# Patient Record
Sex: Female | Born: 1950
Health system: Southern US, Community
[De-identification: ages and names within clinical notes are randomized; demographics above are authoritative.]

## PROBLEM LIST (undated history)

## (undated) DIAGNOSIS — M549 Dorsalgia, unspecified: Secondary | ICD-10-CM

## (undated) DIAGNOSIS — M2550 Pain in unspecified joint: Secondary | ICD-10-CM

## (undated) DIAGNOSIS — J069 Acute upper respiratory infection, unspecified: Secondary | ICD-10-CM

## (undated) DIAGNOSIS — D369 Benign neoplasm, unspecified site: Secondary | ICD-10-CM

## (undated) DIAGNOSIS — Z8619 Personal history of other infectious and parasitic diseases: Secondary | ICD-10-CM

## (undated) DIAGNOSIS — J309 Allergic rhinitis, unspecified: Secondary | ICD-10-CM

## (undated) DIAGNOSIS — L509 Urticaria, unspecified: Secondary | ICD-10-CM

## (undated) DIAGNOSIS — F419 Anxiety disorder, unspecified: Secondary | ICD-10-CM

## (undated) DIAGNOSIS — F32A Depression, unspecified: Secondary | ICD-10-CM

## (undated) DIAGNOSIS — M797 Fibromyalgia: Secondary | ICD-10-CM

## (undated) DIAGNOSIS — G4700 Insomnia, unspecified: Secondary | ICD-10-CM

## (undated) DIAGNOSIS — I471 Supraventricular tachycardia, unspecified: Secondary | ICD-10-CM

## (undated) DIAGNOSIS — M069 Rheumatoid arthritis, unspecified: Secondary | ICD-10-CM

## (undated) DIAGNOSIS — K5900 Constipation, unspecified: Secondary | ICD-10-CM

## (undated) DIAGNOSIS — E785 Hyperlipidemia, unspecified: Secondary | ICD-10-CM

## (undated) DIAGNOSIS — Z860100 Personal history of colon polyps, unspecified: Secondary | ICD-10-CM

## (undated) DIAGNOSIS — M810 Age-related osteoporosis without current pathological fracture: Secondary | ICD-10-CM

## (undated) DIAGNOSIS — Z8489 Family history of other specified conditions: Secondary | ICD-10-CM

## (undated) DIAGNOSIS — R3915 Urgency of urination: Secondary | ICD-10-CM

## (undated) DIAGNOSIS — H269 Unspecified cataract: Secondary | ICD-10-CM

## (undated) DIAGNOSIS — J45909 Unspecified asthma, uncomplicated: Secondary | ICD-10-CM

## (undated) DIAGNOSIS — M1990 Unspecified osteoarthritis, unspecified site: Secondary | ICD-10-CM

## (undated) DIAGNOSIS — K219 Gastro-esophageal reflux disease without esophagitis: Secondary | ICD-10-CM

## (undated) DIAGNOSIS — G8929 Other chronic pain: Secondary | ICD-10-CM

## (undated) DIAGNOSIS — Z8709 Personal history of other diseases of the respiratory system: Secondary | ICD-10-CM

## (undated) DIAGNOSIS — I10 Essential (primary) hypertension: Secondary | ICD-10-CM

## (undated) DIAGNOSIS — L309 Dermatitis, unspecified: Secondary | ICD-10-CM

## (undated) DIAGNOSIS — T7840XA Allergy, unspecified, initial encounter: Secondary | ICD-10-CM

## (undated) DIAGNOSIS — F329 Major depressive disorder, single episode, unspecified: Secondary | ICD-10-CM

## (undated) DIAGNOSIS — I499 Cardiac arrhythmia, unspecified: Secondary | ICD-10-CM

## (undated) DIAGNOSIS — J189 Pneumonia, unspecified organism: Secondary | ICD-10-CM

## (undated) DIAGNOSIS — Z9289 Personal history of other medical treatment: Secondary | ICD-10-CM

## (undated) HISTORY — DX: Acute upper respiratory infection, unspecified: J06.9

## (undated) HISTORY — DX: Unspecified asthma, uncomplicated: J45.909

## (undated) HISTORY — PX: COLONOSCOPY: SHX174

## (undated) HISTORY — PX: ADENOIDECTOMY: SUR15

## (undated) HISTORY — DX: Unspecified cataract: H26.9

## (undated) HISTORY — DX: Rheumatoid arthritis, unspecified: M06.9

## (undated) HISTORY — DX: Urticaria, unspecified: L50.9

## (undated) HISTORY — DX: Allergy, unspecified, initial encounter: T78.40XA

## (undated) HISTORY — DX: Benign neoplasm, unspecified site: D36.9

## (undated) HISTORY — DX: Supraventricular tachycardia, unspecified: I47.10

## (undated) HISTORY — PX: KNEE ARTHROSCOPY: SUR90

## (undated) HISTORY — PX: TONSILLECTOMY: SUR1361

## (undated) HISTORY — PX: APPENDECTOMY: SHX54

## (undated) HISTORY — PX: OTHER SURGICAL HISTORY: SHX169

## (undated) HISTORY — DX: Depression, unspecified: F32.A

## (undated) HISTORY — DX: Essential (primary) hypertension: I10

## (undated) HISTORY — DX: Fibromyalgia: M79.7

## (undated) HISTORY — DX: Allergic rhinitis, unspecified: J30.9

## (undated) HISTORY — DX: Age-related osteoporosis without current pathological fracture: M81.0

## (undated) HISTORY — DX: Dermatitis, unspecified: L30.9

## (undated) HISTORY — DX: Major depressive disorder, single episode, unspecified: F32.9

## (undated) HISTORY — DX: Supraventricular tachycardia: I47.1

## (undated) HISTORY — PX: SHOULDER SURGERY: SHX246

## (undated) HISTORY — PX: BREAST BIOPSY: SHX20

## (undated) HISTORY — DX: Unspecified osteoarthritis, unspecified site: M19.90

---

## 1999-01-28 ENCOUNTER — Other Ambulatory Visit: Admission: RE | Admit: 1999-01-28 | Discharge: 1999-01-28 | Payer: Self-pay | Admitting: *Deleted

## 1999-12-28 ENCOUNTER — Encounter: Payer: Self-pay | Admitting: Allergy and Immunology

## 1999-12-28 ENCOUNTER — Encounter: Admission: RE | Admit: 1999-12-28 | Discharge: 1999-12-28 | Payer: Self-pay | Admitting: *Deleted

## 2000-04-21 ENCOUNTER — Other Ambulatory Visit: Admission: RE | Admit: 2000-04-21 | Discharge: 2000-04-21 | Payer: Self-pay | Admitting: Internal Medicine

## 2001-01-23 ENCOUNTER — Other Ambulatory Visit: Admission: RE | Admit: 2001-01-23 | Discharge: 2001-01-23 | Payer: Self-pay | Admitting: Internal Medicine

## 2002-03-12 ENCOUNTER — Other Ambulatory Visit: Admission: RE | Admit: 2002-03-12 | Discharge: 2002-03-12 | Payer: Self-pay | Admitting: Internal Medicine

## 2002-12-05 ENCOUNTER — Encounter: Payer: Self-pay | Admitting: Internal Medicine

## 2002-12-05 ENCOUNTER — Encounter: Admission: RE | Admit: 2002-12-05 | Discharge: 2002-12-05 | Payer: Self-pay | Admitting: Internal Medicine

## 2002-12-11 ENCOUNTER — Encounter: Admission: RE | Admit: 2002-12-11 | Discharge: 2002-12-11 | Payer: Self-pay | Admitting: Sports Medicine

## 2003-04-17 ENCOUNTER — Other Ambulatory Visit: Admission: RE | Admit: 2003-04-17 | Discharge: 2003-04-17 | Payer: Self-pay | Admitting: Internal Medicine

## 2003-10-31 ENCOUNTER — Encounter: Admission: RE | Admit: 2003-10-31 | Discharge: 2003-10-31 | Payer: Self-pay | Admitting: Internal Medicine

## 2004-01-26 ENCOUNTER — Encounter: Admission: RE | Admit: 2004-01-26 | Discharge: 2004-01-26 | Payer: Self-pay | Admitting: Internal Medicine

## 2004-04-23 ENCOUNTER — Other Ambulatory Visit: Admission: RE | Admit: 2004-04-23 | Discharge: 2004-04-23 | Payer: Self-pay | Admitting: Internal Medicine

## 2004-07-15 ENCOUNTER — Encounter: Admission: RE | Admit: 2004-07-15 | Discharge: 2004-07-15 | Payer: Self-pay | Admitting: Internal Medicine

## 2004-07-20 ENCOUNTER — Encounter: Admission: RE | Admit: 2004-07-20 | Discharge: 2004-07-20 | Payer: Self-pay | Admitting: Internal Medicine

## 2004-08-19 ENCOUNTER — Ambulatory Visit (HOSPITAL_COMMUNITY): Admission: RE | Admit: 2004-08-19 | Discharge: 2004-08-19 | Payer: Self-pay | Admitting: Orthopedic Surgery

## 2005-03-31 ENCOUNTER — Other Ambulatory Visit: Admission: RE | Admit: 2005-03-31 | Discharge: 2005-03-31 | Payer: Self-pay | Admitting: Internal Medicine

## 2006-03-21 ENCOUNTER — Other Ambulatory Visit: Admission: RE | Admit: 2006-03-21 | Discharge: 2006-03-21 | Payer: Self-pay | Admitting: Internal Medicine

## 2007-03-23 ENCOUNTER — Other Ambulatory Visit: Admission: RE | Admit: 2007-03-23 | Discharge: 2007-03-23 | Payer: Self-pay | Admitting: Internal Medicine

## 2007-10-14 ENCOUNTER — Emergency Department (HOSPITAL_BASED_OUTPATIENT_CLINIC_OR_DEPARTMENT_OTHER): Admission: EM | Admit: 2007-10-14 | Discharge: 2007-10-14 | Payer: Self-pay | Admitting: Emergency Medicine

## 2008-04-01 ENCOUNTER — Ambulatory Visit: Payer: Self-pay | Admitting: Internal Medicine

## 2008-04-17 ENCOUNTER — Ambulatory Visit: Payer: Self-pay | Admitting: Internal Medicine

## 2008-05-05 ENCOUNTER — Ambulatory Visit: Payer: Self-pay | Admitting: Internal Medicine

## 2008-07-03 ENCOUNTER — Ambulatory Visit: Payer: Self-pay | Admitting: Internal Medicine

## 2008-07-22 ENCOUNTER — Ambulatory Visit: Payer: Self-pay | Admitting: Internal Medicine

## 2008-08-26 ENCOUNTER — Ambulatory Visit: Payer: Self-pay | Admitting: Internal Medicine

## 2008-08-29 ENCOUNTER — Encounter: Admission: RE | Admit: 2008-08-29 | Discharge: 2008-08-29 | Payer: Self-pay | Admitting: Internal Medicine

## 2008-10-03 ENCOUNTER — Ambulatory Visit: Payer: Self-pay | Admitting: Internal Medicine

## 2008-11-28 ENCOUNTER — Ambulatory Visit: Payer: Self-pay | Admitting: Internal Medicine

## 2009-02-20 ENCOUNTER — Ambulatory Visit: Payer: Self-pay | Admitting: Internal Medicine

## 2009-06-25 ENCOUNTER — Ambulatory Visit: Payer: Self-pay | Admitting: Internal Medicine

## 2009-06-25 ENCOUNTER — Other Ambulatory Visit: Admission: RE | Admit: 2009-06-25 | Discharge: 2009-06-25 | Payer: Self-pay | Admitting: Internal Medicine

## 2009-07-10 ENCOUNTER — Ambulatory Visit: Payer: Self-pay | Admitting: Internal Medicine

## 2009-07-14 ENCOUNTER — Ambulatory Visit: Payer: Self-pay | Admitting: Internal Medicine

## 2009-09-17 ENCOUNTER — Ambulatory Visit: Payer: Self-pay | Admitting: Internal Medicine

## 2010-03-08 LAB — HM COLONOSCOPY

## 2010-03-25 ENCOUNTER — Ambulatory Visit: Payer: Self-pay | Admitting: Internal Medicine

## 2010-04-18 HISTORY — PX: BACK SURGERY: SHX140

## 2010-04-20 ENCOUNTER — Ambulatory Visit
Admission: RE | Admit: 2010-04-20 | Discharge: 2010-04-20 | Payer: Self-pay | Source: Home / Self Care | Attending: Internal Medicine | Admitting: Internal Medicine

## 2010-09-24 ENCOUNTER — Other Ambulatory Visit: Payer: Self-pay | Admitting: Neurosurgery

## 2010-09-24 DIAGNOSIS — M479 Spondylosis, unspecified: Secondary | ICD-10-CM

## 2010-09-27 ENCOUNTER — Other Ambulatory Visit: Payer: 59 | Admitting: Internal Medicine

## 2010-09-27 ENCOUNTER — Ambulatory Visit
Admission: RE | Admit: 2010-09-27 | Discharge: 2010-09-27 | Disposition: A | Discharge status: Home / Self Care | Payer: 59 | Source: Ambulatory Visit | Attending: Neurosurgery | Admitting: Neurosurgery

## 2010-09-27 DIAGNOSIS — E785 Hyperlipidemia, unspecified: Secondary | ICD-10-CM

## 2010-09-27 DIAGNOSIS — M479 Spondylosis, unspecified: Secondary | ICD-10-CM

## 2010-09-27 DIAGNOSIS — I10 Essential (primary) hypertension: Secondary | ICD-10-CM

## 2010-09-27 DIAGNOSIS — Z780 Asymptomatic menopausal state: Secondary | ICD-10-CM

## 2010-09-27 LAB — HEPATIC FUNCTION PANEL
ALT: 32 U/L (ref 0–35)
AST: 32 U/L (ref 0–37)
Albumin: 4.4 g/dL (ref 3.5–5.2)
Alkaline Phosphatase: 79 U/L (ref 39–117)
Bilirubin, Direct: 0.1 mg/dL (ref 0.0–0.3)
Indirect Bilirubin: 0.3 mg/dL (ref 0.0–0.9)
Total Bilirubin: 0.4 mg/dL (ref 0.3–1.2)
Total Protein: 6.6 g/dL (ref 6.0–8.3)

## 2010-09-27 LAB — CBC WITH DIFFERENTIAL/PLATELET
Basophils Absolute: 0 10*3/uL (ref 0.0–0.1)
Basophils Relative: 1 % (ref 0–1)
Eosinophils Absolute: 0.1 10*3/uL (ref 0.0–0.7)
Eosinophils Relative: 2 % (ref 0–5)
HCT: 39.9 % (ref 36.0–46.0)
Hemoglobin: 12.9 g/dL (ref 12.0–15.0)
Lymphocytes Relative: 30 % (ref 12–46)
Lymphs Abs: 2 10*3/uL (ref 0.7–4.0)
MCH: 31.7 pg (ref 26.0–34.0)
MCHC: 32.3 g/dL (ref 30.0–36.0)
MCV: 98 fL (ref 78.0–100.0)
Monocytes Absolute: 0.5 10*3/uL (ref 0.1–1.0)
Monocytes Relative: 8 % (ref 3–12)
Neutro Abs: 3.9 10*3/uL (ref 1.7–7.7)
Neutrophils Relative %: 59 % (ref 43–77)
Platelets: 245 10*3/uL (ref 150–400)
RBC: 4.07 MIL/uL (ref 3.87–5.11)
RDW: 13.5 % (ref 11.5–15.5)
WBC: 6.6 10*3/uL (ref 4.0–10.5)

## 2010-09-27 LAB — LIPID PANEL
Cholesterol: 177 mg/dL (ref 0–200)
HDL: 43 mg/dL (ref >39)
LDL Cholesterol: 96 mg/dL (ref 0–99)
Total CHOL/HDL Ratio: 4.1 Ratio
Triglycerides: 191 mg/dL — ABNORMAL HIGH (ref <150)
VLDL: 38 mg/dL (ref 0–40)

## 2010-09-27 LAB — BASIC METABOLIC PANEL
BUN: 17 mg/dL (ref 6–23)
CO2: 31 mEq/L (ref 19–32)
Calcium: 9.5 mg/dL (ref 8.4–10.5)
Chloride: 104 mEq/L (ref 96–112)
Creat: 0.87 mg/dL (ref 0.50–1.10)
Glucose, Bld: 103 mg/dL — ABNORMAL HIGH (ref 70–99)
Potassium: 4.2 mEq/L (ref 3.5–5.3)
Sodium: 141 mEq/L (ref 135–145)

## 2010-09-27 LAB — TSH: TSH: 1.327 u[IU]/mL (ref 0.350–4.500)

## 2010-09-28 ENCOUNTER — Ambulatory Visit (INDEPENDENT_AMBULATORY_CARE_PROVIDER_SITE_OTHER): Payer: 59 | Admitting: Internal Medicine

## 2010-09-28 ENCOUNTER — Encounter: Payer: Self-pay | Admitting: Internal Medicine

## 2010-09-28 DIAGNOSIS — Z860100 Personal history of colon polyps, unspecified: Secondary | ICD-10-CM

## 2010-09-28 DIAGNOSIS — I10 Essential (primary) hypertension: Secondary | ICD-10-CM

## 2010-09-28 DIAGNOSIS — K219 Gastro-esophageal reflux disease without esophagitis: Secondary | ICD-10-CM

## 2010-09-28 DIAGNOSIS — F329 Major depressive disorder, single episode, unspecified: Secondary | ICD-10-CM

## 2010-09-28 DIAGNOSIS — M47816 Spondylosis without myelopathy or radiculopathy, lumbar region: Secondary | ICD-10-CM

## 2010-09-28 DIAGNOSIS — Z0000 Encounter for general adult medical examination without abnormal findings: Secondary | ICD-10-CM

## 2010-09-28 DIAGNOSIS — E785 Hyperlipidemia, unspecified: Secondary | ICD-10-CM

## 2010-09-28 DIAGNOSIS — F32A Depression, unspecified: Secondary | ICD-10-CM

## 2010-09-28 DIAGNOSIS — Z8679 Personal history of other diseases of the circulatory system: Secondary | ICD-10-CM

## 2010-09-28 DIAGNOSIS — F3289 Other specified depressive episodes: Secondary | ICD-10-CM

## 2010-09-28 DIAGNOSIS — D126 Benign neoplasm of colon, unspecified: Secondary | ICD-10-CM

## 2010-09-28 DIAGNOSIS — M47817 Spondylosis without myelopathy or radiculopathy, lumbosacral region: Secondary | ICD-10-CM

## 2010-09-28 LAB — POCT URINALYSIS DIPSTICK
Bilirubin, UA: NEGATIVE
Blood, UA: NEGATIVE
Glucose, UA: NEGATIVE
Ketones, UA: NEGATIVE
Leukocytes, UA: NEGATIVE
Nitrite, UA: NEGATIVE
Protein, UA: NEGATIVE
Spec Grav, UA: 1.02
Urobilinogen, UA: NEGATIVE
pH, UA: 5

## 2010-09-28 LAB — VITAMIN D 25 HYDROXY (VIT D DEFICIENCY, FRACTURES): Vit D, 25-Hydroxy: 48 ng/mL (ref 30–89)

## 2010-10-07 ENCOUNTER — Encounter: Payer: Self-pay | Admitting: Internal Medicine

## 2010-10-08 ENCOUNTER — Telehealth: Payer: Self-pay

## 2010-10-08 MED ORDER — LORAZEPAM 1 MG PO TABS: 1.0000 mg | ORAL_TABLET | Freq: Two times a day (BID) | ORAL | Status: AC

## 2010-10-16 ENCOUNTER — Encounter: Payer: Self-pay | Admitting: Internal Medicine

## 2010-10-16 DIAGNOSIS — M47816 Spondylosis without myelopathy or radiculopathy, lumbar region: Secondary | ICD-10-CM | POA: Insufficient documentation

## 2010-10-16 DIAGNOSIS — K219 Gastro-esophageal reflux disease without esophagitis: Secondary | ICD-10-CM | POA: Insufficient documentation

## 2010-10-16 DIAGNOSIS — F329 Major depressive disorder, single episode, unspecified: Secondary | ICD-10-CM | POA: Insufficient documentation

## 2010-10-16 DIAGNOSIS — Z8679 Personal history of other diseases of the circulatory system: Secondary | ICD-10-CM | POA: Insufficient documentation

## 2010-10-16 DIAGNOSIS — F3342 Major depressive disorder, recurrent, in full remission: Secondary | ICD-10-CM | POA: Insufficient documentation

## 2010-10-16 DIAGNOSIS — E785 Hyperlipidemia, unspecified: Secondary | ICD-10-CM | POA: Insufficient documentation

## 2010-10-16 DIAGNOSIS — I10 Essential (primary) hypertension: Secondary | ICD-10-CM | POA: Insufficient documentation

## 2010-10-16 DIAGNOSIS — D126 Benign neoplasm of colon, unspecified: Secondary | ICD-10-CM | POA: Insufficient documentation

## 2010-10-16 DIAGNOSIS — F32A Depression, unspecified: Secondary | ICD-10-CM | POA: Insufficient documentation

## 2010-10-16 NOTE — Patient Instructions (Signed)
Continue current medications. Obliquity or surgery. Recheck lipid panel liver functions in 6 months with office visit

## 2010-10-16 NOTE — Progress Notes (Signed)
  Subjective:    Patient ID: Vanessa Harmon, female    DOB: 08/28/1950, 60 y.o.   MRN: [id number]  HPI white female with multiple medical problems including allergic rhinitis, GE reflux, paroxysmal supraventricular tachycardia, depression, hyperlipidemia, adenomatous colon polyp, hypertension. 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 Job is somewhat stressful. Also she has an elderly mother living out of state that as a stressor. Patient is married and shows dogs. In January 2012, sol orthopedist for left shoulder pain. Shoulder was injected with Marcaine and Depo-Medrol. History of multilevel spondylitic changes of the lumbar spine. Has gotten epidural steroid injections. Is now going to have to have surgical intervention for her low back by Dr. Phoebe Perch.  Review of Systems  Constitutional: Negative.   HENT: Negative.   Eyes: Negative.   Respiratory: Negative.   Cardiovascular: Negative.   Gastrointestinal: Negative.   Genitourinary: Negative.   Musculoskeletal: Positive for back pain.  Neurological: Negative.   Hematological: Negative.   Psychiatric/Behavioral: Positive for dysphoric mood.       Objective:   Physical Exam  Constitutional: She is oriented to person, place, and time. She appears well-nourished.  HENT:  Head: Normocephalic and atraumatic.  Right Ear: External ear normal.  Left Ear: External ear normal.  Mouth/Throat: Oropharynx is clear and moist. No oropharyngeal exudate.  Eyes: EOM are normal. Pupils are equal, round, and reactive to light. No scleral icterus.  Neck: Neck supple. No JVD present. No thyromegaly present.  Cardiovascular: Normal rate, regular rhythm, normal heart sounds and intact distal pulses.   No murmur heard. Pulmonary/Chest: Effort normal and breath sounds normal. She has no wheezes. She has no rales.  Abdominal: Soft. Bowel sounds are normal. She exhibits no mass. There is no tenderness.  Musculoskeletal: She  exhibits no edema.  Lymphadenopathy:    She has no cervical adenopathy.  Neurological: She is alert and oriented to person, place, and time. No cranial nerve deficit. Coordination normal.  Skin: Skin is warm and dry. She is not diaphoretic.  Psychiatric: Her behavior is normal. Thought content normal.       Depressed over upcoming surgery          Assessment & Plan:   Back pain with multilevel spondylitic changes and some instability at L3-L4 facing neurosurgery in the near future by Dr. Phoebe Perch  Hypertension  GE reflux  Depression  Hyperlipidemia  Allergic rhinitis  History of adenomatous colon polyp  History of paroxysmal supraventricular tachycardia  History of left shoulder pain  Plan is to continue current medications for control of hypertension and depression. Continue Lipitor for hyperlipidemia. Patient will not be able to exercise until post rehabilitation after back surgery. See again in 6 months at which time she'll need fasting lipid panel liver functions blood pressure check and office visit

## 2010-10-19 ENCOUNTER — Ambulatory Visit (HOSPITAL_COMMUNITY)
Admission: RE | Admit: 2010-10-19 | Discharge: 2010-10-19 | Disposition: A | Discharge status: Home / Self Care | Payer: 59 | Source: Ambulatory Visit | Attending: Neurosurgery | Admitting: Neurosurgery

## 2010-10-19 ENCOUNTER — Encounter (HOSPITAL_COMMUNITY)
Admission: RE | Admit: 2010-10-19 | Discharge: 2010-10-19 | Disposition: A | Discharge status: Home / Self Care | Payer: 59 | Source: Ambulatory Visit | Attending: Neurosurgery | Admitting: Neurosurgery

## 2010-10-19 ENCOUNTER — Other Ambulatory Visit (HOSPITAL_COMMUNITY): Payer: Self-pay | Admitting: Neurosurgery

## 2010-10-19 DIAGNOSIS — Z01818 Encounter for other preprocedural examination: Secondary | ICD-10-CM | POA: Insufficient documentation

## 2010-10-19 DIAGNOSIS — Z01812 Encounter for preprocedural laboratory examination: Secondary | ICD-10-CM | POA: Insufficient documentation

## 2010-10-19 DIAGNOSIS — Z01810 Encounter for preprocedural cardiovascular examination: Secondary | ICD-10-CM | POA: Insufficient documentation

## 2010-10-19 DIAGNOSIS — M4326 Fusion of spine, lumbar region: Secondary | ICD-10-CM

## 2010-10-19 LAB — URINALYSIS, ROUTINE W REFLEX MICROSCOPIC
Bilirubin Urine: NEGATIVE
Glucose, UA: NEGATIVE mg/dL
Hgb urine dipstick: NEGATIVE
Ketones, ur: NEGATIVE mg/dL
Leukocytes, UA: NEGATIVE
Nitrite: NEGATIVE
Protein, ur: NEGATIVE mg/dL
Specific Gravity, Urine: 1.015 (ref 1.005–1.030)
Urobilinogen, UA: 0.2 mg/dL (ref 0.0–1.0)
pH: 7.5 (ref 5.0–8.0)

## 2010-10-19 LAB — SURGICAL PCR SCREEN
MRSA, PCR: NEGATIVE
Staphylococcus aureus: NEGATIVE

## 2010-10-19 LAB — BASIC METABOLIC PANEL
BUN: 14 mg/dL (ref 6–23)
CO2: 30 mEq/L (ref 19–32)
Calcium: 9.6 mg/dL (ref 8.4–10.5)
Chloride: 100 mEq/L (ref 96–112)
Creatinine, Ser: 0.78 mg/dL (ref 0.50–1.10)
GFR calc Af Amer: >60 mL/min (ref >60)
GFR calc non Af Amer: >60 mL/min (ref >60)
Glucose, Bld: 110 mg/dL — ABNORMAL HIGH (ref 70–99)
Potassium: 4 mEq/L (ref 3.5–5.1)
Sodium: 139 mEq/L (ref 135–145)

## 2010-10-19 LAB — CBC
HCT: 39.7 % (ref 36.0–46.0)
Hemoglobin: 13.6 g/dL (ref 12.0–15.0)
MCH: 32.4 pg (ref 26.0–34.0)
MCHC: 34.3 g/dL (ref 30.0–36.0)
MCV: 94.5 fL (ref 78.0–100.0)
Platelets: 230 10*3/uL (ref 150–400)
RBC: 4.2 MIL/uL (ref 3.87–5.11)
RDW: 13 % (ref 11.5–15.5)
WBC: 10.5 10*3/uL (ref 4.0–10.5)

## 2010-10-19 LAB — PROTIME-INR
INR: 0.89 (ref 0.00–1.49)
Prothrombin Time: 12.2 seconds (ref 11.6–15.2)

## 2010-10-19 LAB — APTT: aPTT: 29 seconds (ref 24–37)

## 2010-10-22 NOTE — Telephone Encounter (Signed)
Medication refill for lorazepam completed

## 2010-10-25 ENCOUNTER — Inpatient Hospital Stay (HOSPITAL_COMMUNITY): Payer: 59

## 2010-10-25 ENCOUNTER — Inpatient Hospital Stay (HOSPITAL_COMMUNITY)
Admission: RE | Admit: 2010-10-25 | Discharge: 2010-10-28 | DRG: 460 | Disposition: A | Discharge status: Home / Self Care | Payer: 59 | Source: Ambulatory Visit | Attending: Neurosurgery | Admitting: Neurosurgery

## 2010-10-25 ENCOUNTER — Other Ambulatory Visit: Payer: Self-pay | Admitting: *Deleted

## 2010-10-25 DIAGNOSIS — Z87891 Personal history of nicotine dependence: Secondary | ICD-10-CM

## 2010-10-25 DIAGNOSIS — I10 Essential (primary) hypertension: Secondary | ICD-10-CM | POA: Diagnosis present

## 2010-10-25 DIAGNOSIS — M4310 Spondylolisthesis, site unspecified: Secondary | ICD-10-CM | POA: Diagnosis present

## 2010-10-25 DIAGNOSIS — M47817 Spondylosis without myelopathy or radiculopathy, lumbosacral region: Principal | ICD-10-CM | POA: Diagnosis present

## 2010-10-25 DIAGNOSIS — K219 Gastro-esophageal reflux disease without esophagitis: Secondary | ICD-10-CM | POA: Diagnosis present

## 2010-10-25 LAB — TYPE AND SCREEN
ABO/RH(D): O POS
Antibody Screen: NEGATIVE

## 2010-10-25 LAB — ABO/RH: ABO/RH(D): O POS

## 2010-10-25 MED ORDER — NIACIN ER (ANTIHYPERLIPIDEMIC) 500 MG PO TBCR: 500.0000 mg | EXTENDED_RELEASE_TABLET | Freq: Every day | ORAL | Status: DC

## 2010-10-26 LAB — CBC
HCT: 32.7 % — ABNORMAL LOW (ref 36.0–46.0)
Hemoglobin: 10.8 g/dL — ABNORMAL LOW (ref 12.0–15.0)
MCH: 31.4 pg (ref 26.0–34.0)
MCHC: 33 g/dL (ref 30.0–36.0)
MCV: 95.1 fL (ref 78.0–100.0)
Platelets: 151 10*3/uL (ref 150–400)
RBC: 3.44 MIL/uL — ABNORMAL LOW (ref 3.87–5.11)
RDW: 13.3 % (ref 11.5–15.5)
WBC: 12.1 10*3/uL — ABNORMAL HIGH (ref 4.0–10.5)

## 2010-10-26 LAB — BASIC METABOLIC PANEL
BUN: 7 mg/dL (ref 6–23)
CO2: 30 mEq/L (ref 19–32)
Calcium: 8.3 mg/dL — ABNORMAL LOW (ref 8.4–10.5)
Chloride: 99 mEq/L (ref 96–112)
Creatinine, Ser: 0.67 mg/dL (ref 0.50–1.10)
GFR calc Af Amer: >60 mL/min (ref >60)
GFR calc non Af Amer: >60 mL/min (ref >60)
Glucose, Bld: 143 mg/dL — ABNORMAL HIGH (ref 70–99)
Potassium: 4.1 mEq/L (ref 3.5–5.1)
Sodium: 135 mEq/L (ref 135–145)

## 2010-11-01 NOTE — Op Note (Signed)
NAMEKRISHAWNA, STIEFEL NO.:  [id number]  MEDICAL RECORD NO.:  [id number]  LOCATION:  3003                         FACILITY:  [facility]  PHYSICIAN:  Clydene Fake, M.D.  DATE OF BIRTH:  1951-01-28  DATE OF PROCEDURE:  10/25/2010 DATE OF DISCHARGE:                              OPERATIVE REPORT   PREOPERATIVE DIAGNOSES:  Lumbar stenosis, spondylosis, unstable spondylolisthesis L3-S1 and with a prior surgery.  POSTOPERATIVE DIAGNOSES:  Lumbar stenosis, spondylosis, unstable spondylolisthesis L3-S1 and with a prior surgery.  PROCEDURE:  Redo decompression of the L3, L4, L5, S1 roots (four levels), posterior lumbar interbody fusion L3-4, 4-5, 5-1 (three levels), saber interbody cages at L3-4, 4-5, 5-1, segmented pedicle screw fixation with Expedium screws at L3-S1, posterolateral fusion L3- S1 (three levels), autograft same incision, allograft with Healos and bone marrow aspirate.  SURGEON:  Clydene Fake, MD  ASSISTANT:  Stefani Dama, MD  General endotracheal tube anesthesia.  ESTIMATED BLOOD LOSS:  700 mL.  BLOOD GIVEN:  270 mL, Cell Saver return.  DRAINS:  None.  COMPLICATIONS:  None.  REASON FOR PROCEDURE:  The patient is a 60 year old woman who has had back and leg pain, worse to the right.  Has been through multiple epidural injections which have given some relief, but lately not as much.  MRI, x-rays, and CT show with prior surgery changes in spinous process removed in lower lumbar spine and attempted posterior fusion. Significant degenerative disease changes (stenosis, spondylosis at L3- 5).  The patient brought in for decompression and fusion at these levels.  PROCEDURE IN DETAIL:  The patient was brought to the operating room, general anesthesia induced.  The patient was placed in a prone position on Wilson frame with all pressure points padded.  The patient was prepared and draped in sterile fashion.  Site of incision injected with 20  mL of 1% lidocaine with epinephrine.  Incision was made in midline lower lumbar spine at the area of the scar and then this was extended caudally.  Incision was taken down to fascia.  Hemostasis was obtained with Bovie cauterization.  Fascia was incised and subperiosteal dissection was done at the L5-S1 spinous process lamina out to the facets.  Further spinous processes going cephalad and carefully dissected through the scar tissue down the lamina out to the lateral facets and passed to the transverse processes at L3, 4, 5 and lateral sacrum, and then dissected back more medially.  We placed markers at the transverse processes of 3, 4, and 5, lateral sacrum and the interlaminar space at 5-1, taking lateral x-ray and this confirmed our positioning. We then started decompressive laminectomy with Leksell rongeurs and high- speed drill and Kerrison punches.  We decompressed the central canal from L3 through S1 and decompressing on the L3, L4, L5, and S1 roots bilaterally.  There was significant amount of spinal change at these levels, especially to the right side at L4-5 and L5-1.  This took much dissection to decompress the canal nerve.  Once we had good posterior decompression of central canal and the nerve roots at 3, 4, 5, 1 bilaterally, we then started exploring the epidural space, got hemostasis with bipolar cauterization  inside of disk space, started diskectomy with pituitary rongeurs at 3-4, 4-5, and 5-1.  Starting at 3- 4, we distracted the interspace up to 10 mm.  We prepared the interspace for interbody fusion and removing disk with pituitary rongeurs and curettes using the scrapers and broaches.  Then, we packed the interspace with autograft bone, all the bone removed during laminectomy, had the soft tissue removed, chopped up the small pieces.  All bone dust during drilling was saved using a trap.  This mixture was put together. To this we had DBX putty on this mixture and packed  the interbody space, and then we packed two 10 high saber cages.  With this had the bone mixture and while holding distraction alongside the tapped cage on the contralateral side, we removed the distractor and placed a second cage in the ipsilateral side.  At 4-5, we again prepared the interspace, distracted up to 9 mm, prepared the interspace for interbody fusion at the level above packed interspace as well as the autograft.  We then packed with  two 9 height saber cages and then tapped the cages into position.  Just on top of the 3-4 area on the right side, there was a lateral disk herniation.  We made sure we had well decompressed nerve root prior to placing the cages.  At the 5-1 level, disk space was distracted up to 8 mm and then prepared for interbody fusion, two levels 8-mm saber cages were then tapped into place after packing with the allograft bone graft, and packed in the interspace with autograft bone. We explored the dura and nerve roots.  We had good decompression at all levels in the nerve roots bilaterally.  We used high-speed drill to decorticate lateral facets and transverse process from L3 through the lateral sacrum using fluoroscopy as a guide.  We found pedicle entry point, decorticated with high-speed drill, placed a probe down the pedicle, checked it was small ball probe making sure we had good bony circumference, aspirated the bone marrow aspirate which we placed on the Healos sponges, tapped the pedicle and then placed pedicle screws.  We used 5-mm screws at L3-L4 to do a small pedicle size and 6-mm screws at L5-S1.  Each screw bilaterally at L3-L4, L5-S1 were placed as mentioned above.  The rods were placed in the screw heads locking nuts placed and these were final tightened on both sides.  We did take final AP and lateral fluoroscopic imaging just prior to placing the rods showing good position of the pedicle screws in the interbody cages and good lamina spine.   The rest of the autograft bone was mixed with some DBX putty. It was packed in the posterolateral spaces for posterolateral fusion at L3-S1 and the Healos sponges with bone marrow aspirate was also packed in the posterolateral space for this posterolateral fusion.  We again explored the dura and nerve root, we had good decompression,  I placed some Gelfoam over the lateral gutters.  No bone graft would impinge on the nerve roots.  Retractors were removed.  We had good hemostasis. Fascia closed with 0 Vicryl interrupted sutures.  Subcutaneous tissue closed with 2-0 and 3-0 Vicryl interrupted sutures.  Skin closed with benzoin and Steri-Strips.  Dressing was placed.  The patient was placed back in supine position, awoken from anesthesia, and transferred to recovery room in stable condition.          ______________________________ Clydene Fake, M.D.     JRH/MEDQ  D:  10/25/2010  T:  10/26/2010  Job:  [id number]  Electronically Signed by Colon Branch M.D. on 11/01/2010 09:38:35 AM

## 2010-12-07 NOTE — Discharge Summary (Signed)
  Vanessa Harmon, Vanessa Harmon NO.:  [id number]  MEDICAL RECORD NO.:  [id number]  LOCATION:  3003                         FACILITY:  [facility]  PHYSICIAN:  Clydene Fake, M.D.  DATE OF BIRTH:  28-Apr-1950  DATE OF ADMISSION:  10/25/2010 DATE OF DISCHARGE:  10/28/2010                              DISCHARGE SUMMARY   ADMISSION DIAGNOSES:  Lumbar stenosis, spondylosis, unstable spondylolisthesis at L3-S1 with prior surgery.  DISCHARGE DIAGNOSES:  Lumbar stenosis, spondylosis, unstable spondylolisthesis at L3-S1 with prior surgery.  PROCEDURE:  Redo decompressive laminectomy at L3, L4, L5, and S1 roots with posterior lumbar interbody cages at L3-4, L4-5, and L5-S1, segmented pedicle screw fixation with Expedium screws L3-S1, posterolateral fusion L3-S1, with autograft at same incision, allograft (Healos), bone marrow aspirate.  REASON FOR ADMISSION:  The patient is a 60 year old woman, who has had back and leg pain, worse in the right, dispensed with multiple epidural injections, which given relief but not lately.  MRI, x-rays, CT showed prior surgery changes in spinous process, removed the lower lumbar spine, attempted posterior fusion with significant degenerative change with stenosis, spondylosis at L3-L5, and the patient is brought in for decompression and fusion at these levels.  HOSPITAL COURSE:  The patient admitted on day of surgery, underwent procedure above without complications.  Postoperative, the patient was transferred to the recovery room and then to the floor.  Following day, she moved her legs well.  Incision was clean, dry, and intact.  She started increasing activity, working with physical therapy.  She continued making progress much less leg symptoms than the prior surgery and continued to make progress by October 28, 2010.  Incision was clean, dry, and intact.  She was up ambulating and was discharged home in stable condition on October 28, 2010.  No  strenuous activity.  Up with brace.  Keep the incision dry in total 5 days and medications same as preop except no NSAIDS, Percocet, Flexeril p.r.n.  Follow up with me in the office in 3-4 weeks.          ______________________________ Clydene Fake, M.D.     JRH/MEDQ  D:  11/15/2010  T:  11/15/2010  Job:  [id number]  Electronically Signed by Colon Branch M.D. on 12/07/2010 09:03:58 AM

## 2011-01-17 ENCOUNTER — Other Ambulatory Visit: Payer: Self-pay | Admitting: *Deleted

## 2011-01-17 MED ORDER — ATORVASTATIN CALCIUM 80 MG PO TABS: ORAL_TABLET | ORAL | Status: DC

## 2011-02-01 ENCOUNTER — Ambulatory Visit: Payer: 59 | Attending: Neurosurgery | Admitting: Physical Therapy

## 2011-02-01 DIAGNOSIS — M6281 Muscle weakness (generalized): Secondary | ICD-10-CM | POA: Insufficient documentation

## 2011-02-01 DIAGNOSIS — M545 Low back pain, unspecified: Secondary | ICD-10-CM | POA: Insufficient documentation

## 2011-02-01 DIAGNOSIS — R5381 Other malaise: Secondary | ICD-10-CM | POA: Insufficient documentation

## 2011-02-01 DIAGNOSIS — IMO0001 Reserved for inherently not codable concepts without codable children: Secondary | ICD-10-CM | POA: Insufficient documentation

## 2011-02-10 ENCOUNTER — Ambulatory Visit: Payer: 59 | Admitting: Physical Therapy

## 2011-02-14 ENCOUNTER — Ambulatory Visit: Payer: 59 | Admitting: Physical Therapy

## 2011-02-17 ENCOUNTER — Ambulatory Visit: Payer: 59 | Attending: Neurosurgery | Admitting: Rehabilitation

## 2011-02-17 DIAGNOSIS — M545 Low back pain, unspecified: Secondary | ICD-10-CM | POA: Insufficient documentation

## 2011-02-17 DIAGNOSIS — M6281 Muscle weakness (generalized): Secondary | ICD-10-CM | POA: Insufficient documentation

## 2011-02-17 DIAGNOSIS — IMO0001 Reserved for inherently not codable concepts without codable children: Secondary | ICD-10-CM | POA: Insufficient documentation

## 2011-02-17 DIAGNOSIS — R5381 Other malaise: Secondary | ICD-10-CM | POA: Insufficient documentation

## 2011-03-09 ENCOUNTER — Ambulatory Visit: Payer: 59 | Admitting: Physical Therapy

## 2011-03-14 ENCOUNTER — Ambulatory Visit: Payer: 59 | Admitting: Physical Therapy

## 2011-04-01 ENCOUNTER — Other Ambulatory Visit: Payer: 59 | Admitting: Internal Medicine

## 2011-04-01 ENCOUNTER — Other Ambulatory Visit: Payer: Self-pay

## 2011-04-04 ENCOUNTER — Encounter: Payer: Self-pay | Admitting: Internal Medicine

## 2011-04-04 ENCOUNTER — Ambulatory Visit (INDEPENDENT_AMBULATORY_CARE_PROVIDER_SITE_OTHER): Payer: 59 | Admitting: Internal Medicine

## 2011-04-04 DIAGNOSIS — E785 Hyperlipidemia, unspecified: Secondary | ICD-10-CM

## 2011-04-04 DIAGNOSIS — Z79899 Other long term (current) drug therapy: Secondary | ICD-10-CM

## 2011-04-04 DIAGNOSIS — Z8659 Personal history of other mental and behavioral disorders: Secondary | ICD-10-CM

## 2011-04-04 DIAGNOSIS — I10 Essential (primary) hypertension: Secondary | ICD-10-CM

## 2011-04-04 LAB — HEPATIC FUNCTION PANEL
ALT: 25 U/L (ref 0–35)
AST: 26 U/L (ref 0–37)
Albumin: 4.4 g/dL (ref 3.5–5.2)
Alkaline Phosphatase: 81 U/L (ref 39–117)
Bilirubin, Direct: 0.1 mg/dL (ref 0.0–0.3)
Indirect Bilirubin: 0.4 mg/dL (ref 0.0–0.9)
Total Bilirubin: 0.5 mg/dL (ref 0.3–1.2)
Total Protein: 6.9 g/dL (ref 6.0–8.3)

## 2011-04-04 LAB — LIPID PANEL
Cholesterol: 205 mg/dL — ABNORMAL HIGH (ref 0–200)
HDL: 51 mg/dL (ref >39)
LDL Cholesterol: 114 mg/dL — ABNORMAL HIGH (ref 0–99)
Total CHOL/HDL Ratio: 4 Ratio
Triglycerides: 200 mg/dL — ABNORMAL HIGH (ref <150)
VLDL: 40 mg/dL (ref 0–40)

## 2011-04-04 NOTE — Patient Instructions (Signed)
Continue same antihypertensive medications and lipid-lowering medicine. Try the diet exercise and lose weight. Prescription given for Zostavax vaccine to be obtained at drug store. Return in 6 months for physical exam.

## 2011-04-04 NOTE — Progress Notes (Signed)
  Subjective:    Patient ID: [name], female    DOB: 06-10-1950, 60 y.o.   MRN: [id number]  HPI 60 year old white female who had extensive back surgery in July and did not return to work until mid-October. Has gained 14 pounds in the past year. This is likely due to inactivity after back surgery. She recently purchased a bow flex apparatus and has begun to work out soon. However continuing to work late hours. Says she's depressed about being unable to retire and plans to work 3 more years. History of depression. Blood pressure is elevated today. It could be due to weight gain or other factors. She has not been checking it on a regular basis at home. I have asked her to do that. History of hyperlipidemia. Fasting lipid panel liver functions drawn today. She did get influenza immunization at work.    Review of Systems     Objective:   Physical Exam chest clear to auscultation; cardiac exam regular rate and rhythm; extremities without edema        Assessment & Plan:  History of hypertension  History of hyperlipidemia  History of depression  Weight gain  Plan: Encouraged diet and exercise; patient is to monitor her blood pressure and let me know if it is not well controlled at 130/80 or less. Assessment blood pressure is better controlled than today's reading, return in 6 months for physical examination. Prescription given for Zostavax vaccine to be obtained through drugstore

## 2011-04-11 ENCOUNTER — Other Ambulatory Visit: Payer: Self-pay

## 2011-06-22 ENCOUNTER — Other Ambulatory Visit: Payer: Self-pay

## 2011-06-22 MED ORDER — LOSARTAN POTASSIUM-HCTZ 100-12.5 MG PO TABS: 1.0000 | ORAL_TABLET | Freq: Every day | ORAL | Status: DC

## 2011-08-10 ENCOUNTER — Other Ambulatory Visit: Payer: Self-pay

## 2011-08-10 MED ORDER — INDOMETHACIN ER 75 MG PO CPCR: 75.0000 mg | ORAL_CAPSULE | Freq: Every day | ORAL | Status: DC

## 2011-08-17 ENCOUNTER — Other Ambulatory Visit: Payer: Self-pay

## 2011-08-17 MED ORDER — ESCITALOPRAM OXALATE 10 MG PO TABS: 20.0000 mg | ORAL_TABLET | Freq: Every day | ORAL | Status: DC

## 2011-09-05 ENCOUNTER — Other Ambulatory Visit: Payer: Self-pay

## 2011-09-05 MED ORDER — METOPROLOL TARTRATE 25 MG PO TABS: 25.0000 mg | ORAL_TABLET | Freq: Every day | ORAL | Status: DC

## 2011-10-03 ENCOUNTER — Other Ambulatory Visit: Payer: Self-pay | Admitting: Internal Medicine

## 2011-10-03 ENCOUNTER — Other Ambulatory Visit: Payer: 59 | Admitting: Internal Medicine

## 2011-10-03 DIAGNOSIS — Z0000 Encounter for general adult medical examination without abnormal findings: Secondary | ICD-10-CM

## 2011-10-03 DIAGNOSIS — I10 Essential (primary) hypertension: Secondary | ICD-10-CM

## 2011-10-03 LAB — CBC WITH DIFFERENTIAL/PLATELET
Basophils Absolute: 0 10*3/uL (ref 0.0–0.1)
Basophils Relative: 0 % (ref 0–1)
Eosinophils Absolute: 0.2 10*3/uL (ref 0.0–0.7)
Eosinophils Relative: 3 % (ref 0–5)
HCT: 38 % (ref 36.0–46.0)
Hemoglobin: 12.8 g/dL (ref 12.0–15.0)
Lymphocytes Relative: 21 % (ref 12–46)
Lymphs Abs: 1.4 10*3/uL (ref 0.7–4.0)
MCH: 30.9 pg (ref 26.0–34.0)
MCHC: 33.7 g/dL (ref 30.0–36.0)
MCV: 91.8 fL (ref 78.0–100.0)
Monocytes Absolute: 0.6 10*3/uL (ref 0.1–1.0)
Monocytes Relative: 9 % (ref 3–12)
Neutro Abs: 4.2 10*3/uL (ref 1.7–7.7)
Neutrophils Relative %: 67 % (ref 43–77)
Platelets: 239 10*3/uL (ref 150–400)
RBC: 4.14 MIL/uL (ref 3.87–5.11)
RDW: 13.2 % (ref 11.5–15.5)
WBC: 6.4 10*3/uL (ref 4.0–10.5)

## 2011-10-03 LAB — LIPID PANEL
Cholesterol: 135 mg/dL (ref 0–200)
HDL: 38 mg/dL — ABNORMAL LOW (ref >39)
LDL Cholesterol: 77 mg/dL (ref 0–99)
Total CHOL/HDL Ratio: 3.6 Ratio
Triglycerides: 102 mg/dL (ref <150)
VLDL: 20 mg/dL (ref 0–40)

## 2011-10-03 LAB — COMPREHENSIVE METABOLIC PANEL
ALT: 18 U/L (ref 0–35)
AST: 19 U/L (ref 0–37)
Albumin: 4.4 g/dL (ref 3.5–5.2)
Alkaline Phosphatase: 62 U/L (ref 39–117)
BUN: 18 mg/dL (ref 6–23)
CO2: 30 mEq/L (ref 19–32)
Calcium: 9.4 mg/dL (ref 8.4–10.5)
Chloride: 105 mEq/L (ref 96–112)
Creat: 0.89 mg/dL (ref 0.50–1.10)
Glucose, Bld: 90 mg/dL (ref 70–99)
Potassium: 4.5 mEq/L (ref 3.5–5.3)
Sodium: 143 mEq/L (ref 135–145)
Total Bilirubin: 0.7 mg/dL (ref 0.3–1.2)
Total Protein: 6.3 g/dL (ref 6.0–8.3)

## 2011-10-03 LAB — TSH: TSH: 1.843 u[IU]/mL (ref 0.350–4.500)

## 2011-10-04 ENCOUNTER — Ambulatory Visit (INDEPENDENT_AMBULATORY_CARE_PROVIDER_SITE_OTHER): Payer: 59 | Admitting: Internal Medicine

## 2011-10-04 ENCOUNTER — Encounter: Payer: Self-pay | Admitting: Internal Medicine

## 2011-10-04 VITALS — BP 140/82 | HR 60 | Temp 97.9°F | Ht 66.85 in | Wt 176.0 lb

## 2011-10-04 DIAGNOSIS — F3289 Other specified depressive episodes: Secondary | ICD-10-CM

## 2011-10-04 DIAGNOSIS — F329 Major depressive disorder, single episode, unspecified: Secondary | ICD-10-CM

## 2011-10-04 DIAGNOSIS — I10 Essential (primary) hypertension: Secondary | ICD-10-CM

## 2011-10-04 DIAGNOSIS — F32A Depression, unspecified: Secondary | ICD-10-CM

## 2011-10-04 DIAGNOSIS — E785 Hyperlipidemia, unspecified: Secondary | ICD-10-CM

## 2011-10-04 DIAGNOSIS — R7302 Impaired glucose tolerance (oral): Secondary | ICD-10-CM

## 2011-10-04 DIAGNOSIS — R7309 Other abnormal glucose: Secondary | ICD-10-CM

## 2011-10-04 DIAGNOSIS — M545 Low back pain, unspecified: Secondary | ICD-10-CM

## 2011-10-04 DIAGNOSIS — Z0000 Encounter for general adult medical examination without abnormal findings: Secondary | ICD-10-CM

## 2011-10-04 DIAGNOSIS — K219 Gastro-esophageal reflux disease without esophagitis: Secondary | ICD-10-CM

## 2011-10-04 DIAGNOSIS — J309 Allergic rhinitis, unspecified: Secondary | ICD-10-CM

## 2011-10-04 DIAGNOSIS — Z8679 Personal history of other diseases of the circulatory system: Secondary | ICD-10-CM

## 2011-10-04 DIAGNOSIS — D126 Benign neoplasm of colon, unspecified: Secondary | ICD-10-CM

## 2011-10-04 LAB — POCT URINALYSIS DIPSTICK
Bilirubin, UA: NEGATIVE
Blood, UA: NEGATIVE
Glucose, UA: NEGATIVE
Ketones, UA: NEGATIVE
Leukocytes, UA: NEGATIVE
Nitrite, UA: NEGATIVE
Protein, UA: NEGATIVE
Spec Grav, UA: 1.01
Urobilinogen, UA: NEGATIVE
pH, UA: 7

## 2011-10-04 LAB — HEMOGLOBIN A1C
Hgb A1c MFr Bld: 5.8 % — ABNORMAL HIGH (ref <5.7)
Mean Plasma Glucose: 120 mg/dL — ABNORMAL HIGH (ref <117)

## 2011-10-04 LAB — VITAMIN D 25 HYDROXY (VIT D DEFICIENCY, FRACTURES): Vit D, 25-Hydroxy: 57 ng/mL (ref 30–89)

## 2011-10-04 MED ORDER — FLUTICASONE PROPIONATE 50 MCG/ACT NA SUSP: 2.0000 | Freq: Every day | NASAL | Status: DC

## 2011-10-04 MED ORDER — AZELASTINE HCL 0.1 % NA SOLN: 1.0000 | Freq: Two times a day (BID) | NASAL | Status: DC

## 2011-11-08 ENCOUNTER — Other Ambulatory Visit: Payer: Self-pay

## 2011-11-08 MED ORDER — NIACIN ER (ANTIHYPERLIPIDEMIC) 500 MG PO TBCR: 500.0000 mg | EXTENDED_RELEASE_TABLET | Freq: Every day | ORAL | Status: DC

## 2011-12-19 DIAGNOSIS — M545 Low back pain, unspecified: Secondary | ICD-10-CM | POA: Insufficient documentation

## 2011-12-19 DIAGNOSIS — R7302 Impaired glucose tolerance (oral): Secondary | ICD-10-CM | POA: Insufficient documentation

## 2011-12-19 DIAGNOSIS — J3089 Other allergic rhinitis: Secondary | ICD-10-CM | POA: Insufficient documentation

## 2011-12-19 NOTE — Patient Instructions (Addendum)
Watch diet and exercise for glucose intolerance. Continue same medications and return in 6 months

## 2011-12-19 NOTE — Progress Notes (Signed)
Subjective:    Patient ID: Vanessa Harmon, female    DOB: 1951-04-09, 61 y.o.   MRN: [id number]  HPI 61 year old white female with history of hypertension, hyperlipidemia, adenomatous colon polyp, depression, GE reflux, lumbar spondylosis status post extensive lumbar surgery 2012 with decompression and fusion and instrumentation from L3-S1 by Dr. Phoebe Perch. Postoperatively she was in a brace for 3 months and had a long recovery. She's doing better. This was done because of history of spinal stenosis and spondylosis. She had scoliosis surgery as a teenager. History of paroxysmal supraventricular tachycardia and allergic rhinitis. Patient breeds and shows dogs.  Is intolerant of Vibramycin causes a rash as does tetracycline. Erythromycin causes nausea. Gets annual influenza immunization through employment. Does not take tetanus toxoid immunizations.  Fractured right wrist June 2009. Right shoulder surgery for labral tear June 2006. Right knee arthroscopy by Dr. Thurston Hole December 2006. Left knee arthroscopy in [place] in the mid 1990s. Last Pap smear March 2011. Bone density study 2007. Had colonoscopy by Dr. Matthias Hughs 2005.  There've been many medications for hypertension she could not tolerate including Altase Cardura and Norvasc.  History of anxiety and depression. Seems to be dysthymic. Takes Indocin for back pain.  Left breast biopsy 1996 or 1997. Blepharoplasty early 39s. Remote history of fractured sacrum after falling on a deck in the late 1980s. Exploratory laparoscopy and appendectomy 1973. Tonsillectomy at age 61. Morton's neuroma removed from right foot in junior high school. Spinal fusion at age 61 for scoliosis.   Social history: She is married does not smoke rarely drinks alcohol. No children but does have some stepchildren. Her stepson owns the [employer] and is a Dealer. 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 Is a single like her job very much.  Family  history: Father died at aged 67 with history of hypertension stroke peptic ulcer disease and prostate cancer. Mother with history of stroke atrial fibrillation hypertension and valve replacement. She has a history of rheumatic fever. She and her mother don't get along very well and she dreads: To see her. One brother and one sister in good health.    Review of Systems  Constitutional: Positive for fatigue.  HENT: Negative.   Eyes: Negative.   Respiratory: Negative.   Cardiovascular:       No recent episodes of tachycardia  Gastrointestinal:       History of GE reflux  Genitourinary: Negative.   Musculoskeletal: Positive for back pain.  Neurological: Negative.   Hematological: Negative.   Psychiatric/Behavioral: Positive for dysphoric mood.       Objective:   Physical Exam  Vitals reviewed. Constitutional: She is oriented to person, place, and time. She appears well-developed and well-nourished. No distress.  HENT:  Head: Normocephalic and atraumatic.  Right Ear: External ear normal.  Left Ear: External ear normal.  Mouth/Throat: Oropharynx is clear and moist. No oropharyngeal exudate.  Eyes: Conjunctivae and EOM are normal. Pupils are equal, round, and reactive to light. Right eye exhibits no discharge. Left eye exhibits no discharge. No scleral icterus.  Neck: Neck supple. No JVD present. No thyromegaly present.       No carotid bruits  Cardiovascular: Normal rate, regular rhythm, normal heart sounds and intact distal pulses.   No murmur heard. Pulmonary/Chest: Effort normal and breath sounds normal. She has no wheezes. She has no rales.  Abdominal: Soft. Bowel sounds are normal. She exhibits no distension and no mass. There is no tenderness. There is no rebound and  no guarding.       No organomegaly  Genitourinary:       Bimanual normal  Musculoskeletal: Normal range of motion. She exhibits no edema.  Lymphadenopathy:    She has no cervical adenopathy.  Neurological: She  is alert and oriented to person, place, and time. She has normal reflexes. No cranial nerve deficit. Coordination normal.  Skin: Skin is warm and dry. No rash noted. She is not diaphoretic.  Psychiatric: She has a normal mood and affect. Her behavior is normal. Judgment and thought content normal.       Dysphoric mood          Assessment & Plan:  History of adenomatous colon polyp  GE reflux  Hyperlipidemia-stable  Hypertension-stable Impaired glucose tolerance-recommend diet and exercise Depression  History of paroxysmal supraventricular tachycardia in the remote past  History of lumbar spondylosis and stenosis status post extensive lumbar surgery by Dr. Phoebe Perch 2012 now with some residual back pain which is chronic but tolerable  Allergic rhinitis  Plan: Continue same medications and return in 6 months. Watch diet. 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

## 2012-01-05 ENCOUNTER — Other Ambulatory Visit: Payer: Self-pay | Admitting: Internal Medicine

## 2012-01-26 ENCOUNTER — Other Ambulatory Visit: Payer: Self-pay | Admitting: Internal Medicine

## 2012-02-09 ENCOUNTER — Other Ambulatory Visit: Payer: Self-pay

## 2012-02-09 MED ORDER — LORAZEPAM 1 MG PO TABS: 1.0000 mg | ORAL_TABLET | Freq: Two times a day (BID) | ORAL | Status: DC | PRN

## 2012-03-16 ENCOUNTER — Other Ambulatory Visit: Payer: Self-pay | Admitting: Internal Medicine

## 2012-04-02 ENCOUNTER — Other Ambulatory Visit: Payer: 59 | Admitting: Internal Medicine

## 2012-04-03 ENCOUNTER — Ambulatory Visit: Payer: 59 | Admitting: Internal Medicine

## 2012-04-26 ENCOUNTER — Other Ambulatory Visit: Payer: 59 | Admitting: Internal Medicine

## 2012-04-27 ENCOUNTER — Ambulatory Visit: Payer: 59 | Admitting: Internal Medicine

## 2012-05-01 ENCOUNTER — Other Ambulatory Visit: Payer: 59 | Admitting: Internal Medicine

## 2012-05-01 DIAGNOSIS — R7301 Impaired fasting glucose: Secondary | ICD-10-CM

## 2012-05-01 DIAGNOSIS — Z79899 Other long term (current) drug therapy: Secondary | ICD-10-CM

## 2012-05-01 DIAGNOSIS — E785 Hyperlipidemia, unspecified: Secondary | ICD-10-CM

## 2012-05-01 LAB — LIPID PANEL
Cholesterol: 147 mg/dL (ref 0–200)
HDL: 48 mg/dL (ref >39)
LDL Cholesterol: 76 mg/dL (ref 0–99)
Total CHOL/HDL Ratio: 3.1 Ratio
Triglycerides: 117 mg/dL (ref <150)
VLDL: 23 mg/dL (ref 0–40)

## 2012-05-01 LAB — HEPATIC FUNCTION PANEL
ALT: 24 U/L (ref 0–35)
AST: 22 U/L (ref 0–37)
Albumin: 4.4 g/dL (ref 3.5–5.2)
Alkaline Phosphatase: 68 U/L (ref 39–117)
Bilirubin, Direct: 0.2 mg/dL (ref 0.0–0.3)
Indirect Bilirubin: 0.6 mg/dL (ref 0.0–0.9)
Total Bilirubin: 0.8 mg/dL (ref 0.3–1.2)
Total Protein: 6.7 g/dL (ref 6.0–8.3)

## 2012-05-01 LAB — HEMOGLOBIN A1C
Hgb A1c MFr Bld: 6 % — ABNORMAL HIGH (ref <5.7)
Mean Plasma Glucose: 126 mg/dL — ABNORMAL HIGH (ref <117)

## 2012-05-03 ENCOUNTER — Encounter: Payer: Self-pay | Admitting: Internal Medicine

## 2012-05-03 ENCOUNTER — Ambulatory Visit (INDEPENDENT_AMBULATORY_CARE_PROVIDER_SITE_OTHER): Payer: 59 | Admitting: Internal Medicine

## 2012-05-03 VITALS — BP 136/72 | HR 50 | Temp 97.1°F | Wt 174.0 lb

## 2012-05-03 DIAGNOSIS — H6500 Acute serous otitis media, unspecified ear: Secondary | ICD-10-CM

## 2012-05-03 DIAGNOSIS — F329 Major depressive disorder, single episode, unspecified: Secondary | ICD-10-CM

## 2012-05-03 DIAGNOSIS — I10 Essential (primary) hypertension: Secondary | ICD-10-CM

## 2012-05-03 DIAGNOSIS — F3289 Other specified depressive episodes: Secondary | ICD-10-CM

## 2012-05-03 DIAGNOSIS — H6502 Acute serous otitis media, left ear: Secondary | ICD-10-CM

## 2012-05-03 DIAGNOSIS — F32A Depression, unspecified: Secondary | ICD-10-CM

## 2012-05-03 NOTE — Progress Notes (Signed)
  Subjective:    Patient ID: [name], female    DOB: Mar 02, 1951, 62 y.o.   MRN: [id number]  HPI  Three week history of runny nose, pain in left ear, fatigue. Here for 6 month recheck. Blood presssure is controlled. Retired in 15-Mar-2023. Mother died earlier this week out of state from complications of a stroke. Patient says they will have a memorial service a later date. She is attending some dog shows. Looking for a new car. Had influenza immunization this past fall through employment.    Review of Systems     Objective:   Physical Exam HEENT exam: Left TM is full but not red. Right TM clear. Pharynx only very slightly injected. Neck is supple without adenopathy or thyromegaly. Chest clear to auscultation.  Extremities without edema. Skin is warm and dry.        Assessment & Plan:  Hypertension-well-controlled  Hyperlipidemia-well-controlled on current regimen. Reviewed results of lipid panel liver functions with her  Glucose intolerance-hemoglobin A1c 6.0% and stable. Patient just started [employer] this week.  Left serous otitis media  URI  Plan: Levaquin 500 milligrams daily for 7 days. Return in 6 months for physical examination and fasting lab work.

## 2012-05-03 NOTE — Patient Instructions (Addendum)
Take  Levaquin for 7 days once daily.   Return for PE in 6 months. No change in chronic medication regimen.

## 2012-06-14 ENCOUNTER — Other Ambulatory Visit: Payer: Self-pay

## 2012-06-14 MED ORDER — ATORVASTATIN CALCIUM 80 MG PO TABS: 80.0000 mg | ORAL_TABLET | Freq: Every day | ORAL | Status: DC

## 2012-07-23 ENCOUNTER — Other Ambulatory Visit: Payer: Self-pay | Admitting: Internal Medicine

## 2012-08-16 ENCOUNTER — Ambulatory Visit (INDEPENDENT_AMBULATORY_CARE_PROVIDER_SITE_OTHER): Payer: Self-pay | Admitting: Internal Medicine

## 2012-08-16 ENCOUNTER — Encounter: Payer: Self-pay | Admitting: Internal Medicine

## 2012-08-16 VITALS — BP 140/78 | HR 72 | Temp 98.2°F | Wt 168.0 lb

## 2012-08-16 DIAGNOSIS — B029 Zoster without complications: Secondary | ICD-10-CM

## 2012-08-16 NOTE — Progress Notes (Signed)
  Subjective:    Patient ID: Vanessa Harmon, female    DOB: Jul 28, 1950, 62 y.o.   MRN: [id number]  HPI    Onset April 22 of rash around right lower abdomen, groin and right trunk in L1 distribution consistent with shingles. Patient was out of state at the time. She took tramadol for pain. Did not seek emergency treatment. Has never received Zostavax because she says  insurance would not pay for it. Having considerable amount of pain not relieved with tramadol.    Review of Systems     Objective:   Physical Exam erythematous vesicular rash in right lower abdomen just above pubic area extending into right groin and around to right trunk in L1 distribution. This is consistent with Herpes zoster        Assessment & Plan:  Herpes zoster  Plan: Micah Flesher ahead and gave her Valtrex 1 g by mouth 3 times a day for 7 days even though it's been longer than 48 hours since outbreak. Norco 10/325 (#60) 1 by mouth every 6-8 hours when necessary pain with one refill. Sterapred DS 10 mg 6 day dosepak take as corrected.

## 2012-08-16 NOTE — Patient Instructions (Addendum)
Take Valtrex 1 g 3 times daily for 7 days, Sterapred DS 10 mg 6 day dosepak, and when necessary Norco 10/325 for pain

## 2012-08-28 ENCOUNTER — Other Ambulatory Visit: Payer: Self-pay | Admitting: Internal Medicine

## 2012-08-28 MED ORDER — LORAZEPAM 1 MG PO TABS: 1.0000 mg | ORAL_TABLET | Freq: Two times a day (BID) | ORAL | Status: DC | PRN

## 2012-09-07 ENCOUNTER — Other Ambulatory Visit: Payer: Self-pay | Admitting: Internal Medicine

## 2012-09-13 ENCOUNTER — Other Ambulatory Visit (HOSPITAL_COMMUNITY): Payer: Self-pay | Admitting: Rheumatology

## 2012-09-13 DIAGNOSIS — R0600 Dyspnea, unspecified: Secondary | ICD-10-CM

## 2012-09-13 DIAGNOSIS — M349 Systemic sclerosis, unspecified: Secondary | ICD-10-CM

## 2012-09-19 ENCOUNTER — Inpatient Hospital Stay (HOSPITAL_COMMUNITY)
Admission: RE | Admit: 2012-09-19 | Discharge: 2012-09-19 | Disposition: A | Discharge status: Home / Self Care | Payer: 59 | Source: Ambulatory Visit | Attending: Rheumatology | Admitting: Rheumatology

## 2012-09-19 ENCOUNTER — Ambulatory Visit (HOSPITAL_COMMUNITY)
Admission: RE | Admit: 2012-09-19 | Discharge: 2012-09-19 | Disposition: A | Discharge status: Home / Self Care | Payer: 59 | Source: Ambulatory Visit | Attending: Rheumatology | Admitting: Rheumatology

## 2012-09-19 DIAGNOSIS — I059 Rheumatic mitral valve disease, unspecified: Secondary | ICD-10-CM | POA: Insufficient documentation

## 2012-09-19 DIAGNOSIS — M349 Systemic sclerosis, unspecified: Secondary | ICD-10-CM | POA: Insufficient documentation

## 2012-09-19 DIAGNOSIS — I10 Essential (primary) hypertension: Secondary | ICD-10-CM | POA: Insufficient documentation

## 2012-09-19 DIAGNOSIS — E785 Hyperlipidemia, unspecified: Secondary | ICD-10-CM | POA: Insufficient documentation

## 2012-09-19 DIAGNOSIS — I079 Rheumatic tricuspid valve disease, unspecified: Secondary | ICD-10-CM | POA: Insufficient documentation

## 2012-09-19 DIAGNOSIS — R0600 Dyspnea, unspecified: Secondary | ICD-10-CM

## 2012-09-19 MED ORDER — ALBUTEROL SULFATE (5 MG/ML) 0.5% IN NEBU
2.5000 mg | INHALATION_SOLUTION | Freq: Once | RESPIRATORY_TRACT | Status: AC
  Administered 2012-09-19: 2.5 mg via RESPIRATORY_TRACT
  Filled 2012-09-19: qty 0.5

## 2012-09-19 NOTE — Progress Notes (Signed)
  Echocardiogram 2D Echocardiogram has been performed.  Arlone Lenhardt 09/19/2012, 9:45 AM

## 2012-09-28 ENCOUNTER — Other Ambulatory Visit: Payer: Self-pay | Admitting: Internal Medicine

## 2012-10-20 ENCOUNTER — Other Ambulatory Visit: Payer: Self-pay | Admitting: Internal Medicine

## 2012-10-29 ENCOUNTER — Other Ambulatory Visit: Payer: 59 | Admitting: Internal Medicine

## 2012-10-29 DIAGNOSIS — Z130 Encounter for screening for diseases of the blood and blood-forming organs and certain disorders involving the immune mechanism: Secondary | ICD-10-CM

## 2012-10-29 DIAGNOSIS — Z79899 Other long term (current) drug therapy: Secondary | ICD-10-CM

## 2012-10-29 DIAGNOSIS — I10 Essential (primary) hypertension: Secondary | ICD-10-CM

## 2012-10-29 DIAGNOSIS — Z1329 Encounter for screening for other suspected endocrine disorder: Secondary | ICD-10-CM

## 2012-10-29 DIAGNOSIS — R7301 Impaired fasting glucose: Secondary | ICD-10-CM

## 2012-10-29 DIAGNOSIS — Z13228 Encounter for screening for other metabolic disorders: Secondary | ICD-10-CM

## 2012-10-29 DIAGNOSIS — E785 Hyperlipidemia, unspecified: Secondary | ICD-10-CM

## 2012-10-29 LAB — COMPREHENSIVE METABOLIC PANEL
ALT: 25 U/L (ref 0–35)
AST: 22 U/L (ref 0–37)
Albumin: 4.4 g/dL (ref 3.5–5.2)
Alkaline Phosphatase: 66 U/L (ref 39–117)
BUN: 17 mg/dL (ref 6–23)
CO2: 29 mEq/L (ref 19–32)
Calcium: 9.7 mg/dL (ref 8.4–10.5)
Chloride: 104 mEq/L (ref 96–112)
Creat: 0.87 mg/dL (ref 0.50–1.10)
Glucose, Bld: 92 mg/dL (ref 70–99)
Potassium: 4.4 mEq/L (ref 3.5–5.3)
Sodium: 141 mEq/L (ref 135–145)
Total Bilirubin: 0.7 mg/dL (ref 0.3–1.2)
Total Protein: 6.5 g/dL (ref 6.0–8.3)

## 2012-10-29 LAB — CBC WITH DIFFERENTIAL/PLATELET
Basophils Absolute: 0 10*3/uL (ref 0.0–0.1)
Basophils Relative: 1 % (ref 0–1)
Eosinophils Absolute: 0.2 10*3/uL (ref 0.0–0.7)
Eosinophils Relative: 3 % (ref 0–5)
HCT: 39.5 % (ref 36.0–46.0)
Hemoglobin: 13.5 g/dL (ref 12.0–15.0)
Lymphocytes Relative: 24 % (ref 12–46)
Lymphs Abs: 1.3 10*3/uL (ref 0.7–4.0)
MCH: 31.4 pg (ref 26.0–34.0)
MCHC: 34.2 g/dL (ref 30.0–36.0)
MCV: 91.9 fL (ref 78.0–100.0)
Monocytes Absolute: 0.5 10*3/uL (ref 0.1–1.0)
Monocytes Relative: 8 % (ref 3–12)
Neutro Abs: 3.7 10*3/uL (ref 1.7–7.7)
Neutrophils Relative %: 64 % (ref 43–77)
Platelets: 264 10*3/uL (ref 150–400)
RBC: 4.3 MIL/uL (ref 3.87–5.11)
RDW: 13.6 % (ref 11.5–15.5)
WBC: 5.7 10*3/uL (ref 4.0–10.5)

## 2012-10-29 LAB — HEMOGLOBIN A1C
Hgb A1c MFr Bld: 5.4 % (ref <5.7)
Mean Plasma Glucose: 108 mg/dL (ref <117)

## 2012-10-29 LAB — LIPID PANEL
Cholesterol: 188 mg/dL (ref 0–200)
HDL: 53 mg/dL (ref >39)
LDL Cholesterol: 116 mg/dL — ABNORMAL HIGH (ref 0–99)
Total CHOL/HDL Ratio: 3.5 Ratio
Triglycerides: 94 mg/dL (ref <150)
VLDL: 19 mg/dL (ref 0–40)

## 2012-10-30 ENCOUNTER — Other Ambulatory Visit (HOSPITAL_COMMUNITY)
Admission: RE | Admit: 2012-10-30 | Discharge: 2012-10-30 | Disposition: A | Discharge status: Home / Self Care | Payer: 59 | Source: Ambulatory Visit | Attending: Internal Medicine | Admitting: Internal Medicine

## 2012-10-30 ENCOUNTER — Encounter: Payer: Self-pay | Admitting: Internal Medicine

## 2012-10-30 ENCOUNTER — Ambulatory Visit (INDEPENDENT_AMBULATORY_CARE_PROVIDER_SITE_OTHER): Payer: 59 | Admitting: Internal Medicine

## 2012-10-30 VITALS — BP 146/80 | HR 52 | Ht 66.75 in | Wt 168.0 lb

## 2012-10-30 DIAGNOSIS — Z86010 Personal history of colonic polyps: Secondary | ICD-10-CM

## 2012-10-30 DIAGNOSIS — R7309 Other abnormal glucose: Secondary | ICD-10-CM

## 2012-10-30 DIAGNOSIS — Z01419 Encounter for gynecological examination (general) (routine) without abnormal findings: Secondary | ICD-10-CM | POA: Insufficient documentation

## 2012-10-30 DIAGNOSIS — F32A Depression, unspecified: Secondary | ICD-10-CM

## 2012-10-30 DIAGNOSIS — K219 Gastro-esophageal reflux disease without esophagitis: Secondary | ICD-10-CM

## 2012-10-30 DIAGNOSIS — R7302 Impaired glucose tolerance (oral): Secondary | ICD-10-CM

## 2012-10-30 DIAGNOSIS — M549 Dorsalgia, unspecified: Secondary | ICD-10-CM

## 2012-10-30 DIAGNOSIS — I10 Essential (primary) hypertension: Secondary | ICD-10-CM

## 2012-10-30 DIAGNOSIS — F3289 Other specified depressive episodes: Secondary | ICD-10-CM

## 2012-10-30 DIAGNOSIS — Z0000 Encounter for general adult medical examination without abnormal findings: Secondary | ICD-10-CM

## 2012-10-30 DIAGNOSIS — Z8679 Personal history of other diseases of the circulatory system: Secondary | ICD-10-CM

## 2012-10-30 DIAGNOSIS — E785 Hyperlipidemia, unspecified: Secondary | ICD-10-CM

## 2012-10-30 DIAGNOSIS — F329 Major depressive disorder, single episode, unspecified: Secondary | ICD-10-CM

## 2012-10-30 LAB — POCT URINALYSIS DIPSTICK
Bilirubin, UA: NEGATIVE
Blood, UA: NEGATIVE
Glucose, UA: NEGATIVE
Ketones, UA: NEGATIVE
Leukocytes, UA: NEGATIVE
Nitrite, UA: NEGATIVE
Protein, UA: NEGATIVE
Spec Grav, UA: 1.01
Urobilinogen, UA: NEGATIVE
pH, UA: 7.5

## 2012-10-30 LAB — VITAMIN D 25 HYDROXY (VIT D DEFICIENCY, FRACTURES): Vit D, 25-Hydroxy: 57 ng/mL (ref 30–89)

## 2012-10-30 LAB — TSH: TSH: 1.465 u[IU]/mL (ref 0.350–4.500)

## 2012-10-30 NOTE — Patient Instructions (Addendum)
Continue same meds and return in 6 months 

## 2012-11-18 ENCOUNTER — Other Ambulatory Visit: Payer: Self-pay | Admitting: Internal Medicine

## 2012-12-16 ENCOUNTER — Other Ambulatory Visit: Payer: Self-pay | Admitting: Internal Medicine

## 2013-02-07 ENCOUNTER — Other Ambulatory Visit: Payer: Self-pay | Admitting: Internal Medicine

## 2013-02-14 ENCOUNTER — Other Ambulatory Visit: Payer: Self-pay | Admitting: Internal Medicine

## 2013-02-26 ENCOUNTER — Other Ambulatory Visit: Payer: Self-pay | Admitting: Internal Medicine

## 2013-03-06 ENCOUNTER — Other Ambulatory Visit: Payer: Self-pay | Admitting: Internal Medicine

## 2013-03-07 ENCOUNTER — Other Ambulatory Visit: Payer: Self-pay | Admitting: *Deleted

## 2013-03-07 MED ORDER — LORAZEPAM 1 MG PO TABS: 1.0000 mg | ORAL_TABLET | Freq: Two times a day (BID) | ORAL | Status: DC | PRN

## 2013-03-07 NOTE — Telephone Encounter (Signed)
Please refill for 6 months 

## 2013-04-20 ENCOUNTER — Encounter: Payer: Self-pay | Admitting: Internal Medicine

## 2013-04-20 NOTE — Progress Notes (Signed)
Subjective:    Patient ID: Vanessa Harmon, female    DOB: June 21, 1950, 63 y.o.   MRN: [id number]  HPI 63 year old white female with history of hypertension, hyperlipidemia, adenomatous colon polyps, depression, GE reflux, lumbar spondylosis status post extensive lumbar surgery 2012 for decompression and fusion with instrumentation from L3-S1 by Dr. Luiz Ochoa in today for health maintenance and evaluation of medical issues.  Is intolerant of Vibramycin-causes a rash as does tetracycline. Erythromycin causes nausea.  Does not take tetanus toxoid immunizations.  Gets annual influenza immunization.  Has a history of paroxysmal supraventricular tachycardia. History of allergic rhinitis. She has scoliosis surgery as a teenager. Surgery in 2012 by Dr. Luiz Ochoa was done because the spinal stenosis and spondylosis.  There have been many medications for hypertension she could not tolerate including Altace, Cardura, Norvasc.  History of anxiety depression. Seems to be dysthymic.  Fractured right wrist in 2009. Right shoulder surgery for labral tear June 2006. Right knee arthroscopy by Dr. Noemi Chapel December 2006. Left knee arthroscopy in [place] in the mid 1990s.  Had bone density study 2007. Had colonoscopy by Dr. Cristina Gong in 2005.  Left breast biopsy 1996 or 1997. Blepharoplasty early 2s. Remote history of fractured sacrum after falling on a deck in the late 1980s. Exploratory laparoscopy and appendectomy 1973. Tonsillectomy at age 56. Morton's neuroma removed from right foot in junior high school.  Social history: She is married. Does not smoke. Rarely drinks alcohol. No children but does have some stepchildren. Her stepson owns and operates [employer]  and is a Editor, commissioning. Husband has been in nursing facility recently after a fall. He is recovering slowly. Patient has been under a lot of stress. She shows and breeds dogs.  Family history: Father died at age 51 with history of  hypertension, stroke, peptic ulcer disease and prostate cancer. Mother with history of stroke, atrial fibrillation, hypertension, valve replacement. Mother had a history of rheumatic fever. One brother and one sister in good health.    Review of Systems  Constitutional: Positive for fatigue.  HENT: Negative.   Eyes: Negative.   Respiratory: Negative.   Cardiovascular: Negative.   Gastrointestinal: Negative.   Endocrine: Negative.   Genitourinary: Negative.   Allergic/Immunologic: Negative.   Neurological: Negative.   Psychiatric/Behavioral:       Depression       Objective:   Physical Exam  Vitals reviewed. Constitutional: She is oriented to person, place, and time. She appears well-developed and well-nourished. No distress.  HENT:  Head: Normocephalic and atraumatic.  Right Ear: External ear normal.  Left Ear: External ear normal.  Mouth/Throat: Oropharynx is clear and moist.  Eyes: Conjunctivae and EOM are normal. Pupils are equal, round, and reactive to light. Right eye exhibits no discharge. Left eye exhibits no discharge. No scleral icterus.  Neck: Neck supple. No JVD present. No thyromegaly present.  Cardiovascular: Normal rate, normal heart sounds and intact distal pulses.   No murmur heard. Pulmonary/Chest: Effort normal and breath sounds normal. No respiratory distress. She has no wheezes. She has no rales.  Breasts normal female  Abdominal: Soft. Bowel sounds are normal. She exhibits no distension and no mass. There is no tenderness. There is no rebound and no guarding.  Genitourinary: No vaginal discharge found.  Pap taken bimanual normal  Musculoskeletal: Normal range of motion. She exhibits no edema.  Lymphadenopathy:    She has no cervical adenopathy.  Neurological: She is alert and oriented to person, place, and time. She has  normal reflexes. She displays normal reflexes. No cranial nerve deficit. Coordination normal.  Skin: Skin is warm and dry. No rash  noted. She is not diaphoretic.  Psychiatric: She has a normal mood and affect. Her behavior is normal. Judgment and thought content normal.  Flat affect          Assessment & Plan:  Hypertension-stable  Hyperlipidemia-stable  Impaired glucose tolerance  History of depression  History of back pain  GE reflux  History of adenomatous colon polyps  History of paroxysmal supraventricular tachycardia  Impaired glucose tolerance-stable  Plan: Continue same medications and return in 6 months

## 2013-05-07 ENCOUNTER — Other Ambulatory Visit: Payer: 59 | Admitting: Internal Medicine

## 2013-05-09 ENCOUNTER — Ambulatory Visit: Payer: 59 | Admitting: Internal Medicine

## 2013-05-27 ENCOUNTER — Other Ambulatory Visit: Payer: 59 | Admitting: Internal Medicine

## 2013-05-28 ENCOUNTER — Ambulatory Visit: Payer: 59 | Admitting: Internal Medicine

## 2013-06-03 ENCOUNTER — Other Ambulatory Visit: Payer: Self-pay | Admitting: Internal Medicine

## 2013-06-03 ENCOUNTER — Other Ambulatory Visit: Payer: 59 | Admitting: Internal Medicine

## 2013-06-03 DIAGNOSIS — Z79899 Other long term (current) drug therapy: Secondary | ICD-10-CM

## 2013-06-03 DIAGNOSIS — R7301 Impaired fasting glucose: Secondary | ICD-10-CM

## 2013-06-03 DIAGNOSIS — E785 Hyperlipidemia, unspecified: Secondary | ICD-10-CM

## 2013-06-03 LAB — HEMOGLOBIN A1C
Hgb A1c MFr Bld: 5.6 % (ref <5.7)
Mean Plasma Glucose: 114 mg/dL (ref <117)

## 2013-06-04 ENCOUNTER — Ambulatory Visit: Payer: 59 | Admitting: Internal Medicine

## 2013-06-04 LAB — LIPID PANEL
Cholesterol: 172 mg/dL (ref 0–200)
HDL: 59 mg/dL (ref >39)
LDL Cholesterol: 84 mg/dL (ref 0–99)
Total CHOL/HDL Ratio: 2.9 Ratio
Triglycerides: 144 mg/dL (ref <150)
VLDL: 29 mg/dL (ref 0–40)

## 2013-06-04 LAB — HEPATIC FUNCTION PANEL
ALT: 31 U/L (ref 0–35)
AST: 23 U/L (ref 0–37)
Albumin: 4.7 g/dL (ref 3.5–5.2)
Alkaline Phosphatase: 61 U/L (ref 39–117)
Bilirubin, Direct: 0.1 mg/dL (ref 0.0–0.3)
Indirect Bilirubin: 0.4 mg/dL (ref 0.2–1.2)
Total Bilirubin: 0.5 mg/dL (ref 0.2–1.2)
Total Protein: 6.7 g/dL (ref 6.0–8.3)

## 2013-06-06 ENCOUNTER — Ambulatory Visit (INDEPENDENT_AMBULATORY_CARE_PROVIDER_SITE_OTHER): Payer: 59 | Admitting: Internal Medicine

## 2013-06-06 ENCOUNTER — Encounter: Payer: Self-pay | Admitting: Internal Medicine

## 2013-06-06 VITALS — BP 120/80 | HR 48 | Temp 97.2°F | Wt 177.0 lb

## 2013-06-06 DIAGNOSIS — M549 Dorsalgia, unspecified: Secondary | ICD-10-CM

## 2013-06-06 DIAGNOSIS — G8929 Other chronic pain: Secondary | ICD-10-CM

## 2013-06-06 DIAGNOSIS — F32A Depression, unspecified: Secondary | ICD-10-CM

## 2013-06-06 DIAGNOSIS — I10 Essential (primary) hypertension: Secondary | ICD-10-CM

## 2013-06-06 DIAGNOSIS — N951 Menopausal and female climacteric states: Secondary | ICD-10-CM

## 2013-06-06 DIAGNOSIS — F329 Major depressive disorder, single episode, unspecified: Secondary | ICD-10-CM

## 2013-06-06 DIAGNOSIS — M899 Disorder of bone, unspecified: Secondary | ICD-10-CM

## 2013-06-06 DIAGNOSIS — M949 Disorder of cartilage, unspecified: Secondary | ICD-10-CM

## 2013-06-06 DIAGNOSIS — R232 Flushing: Secondary | ICD-10-CM

## 2013-06-06 DIAGNOSIS — F3289 Other specified depressive episodes: Secondary | ICD-10-CM

## 2013-06-06 DIAGNOSIS — M8580 Other specified disorders of bone density and structure, unspecified site: Secondary | ICD-10-CM

## 2013-06-06 DIAGNOSIS — E785 Hyperlipidemia, unspecified: Secondary | ICD-10-CM

## 2013-06-06 LAB — T4, FREE: Free T4: 1.03 ng/dL (ref 0.80–1.80)

## 2013-06-06 LAB — TSH: TSH: 2.853 u[IU]/mL (ref 0.350–4.500)

## 2013-06-06 NOTE — Progress Notes (Signed)
   Subjective:    Patient ID: Vanessa Harmon, female    DOB: 1950-07-17, 63 y.o.   MRN: [id number]  HPI 63 -year-old Female in today for six-month recheck. Has a number of complaints. She is seeing Dr. [name] for inflammatory arthritis. Is on methotrexate and prednisone 10 mg daily. Has gained 9 pounds since summer 2014. Husband was in an accident, was hospitalized and subsequently sent to [facility]. He is now at home. He is facing back surgery. Patient continues to have issues with chronic back pain. She is going to see neurosurgeon in the near future. Dr. Luiz Ochoa is moving away and she will need to find new neurosurgeon. Has not had bone density study in some time. This was ordered today. Complaining of hot flashes. She does take niacin but no severe first between hot flashes and flushing with niacin. Says she has a lot of chronic pain in her legs. I've asked her to stop niacin and Lipitor for 3 months to see if the symptoms improve at all. We are going to check thyroid functions today. Her blood pressure stable on current regimen.    Review of Systems     Objective:   Physical Exam Skin warm and dry. Nodes none. Neck is supple without JVD thyromegaly or carotid bruits. Chest clear to auscultation. Cardiac exam regular rate and rhythm normal S1 and S2. Extremities without edema.       Assessment & Plan:  Depression-increase Lexapro from 10-20 mg daily. This may help musculoskeletal pain as well.  Chronic back pain and leg pain-see below about stopping Lipitor and niacin  History of inflammatory arthritis treated by rheumatologist with methotrexate and prednisone  Hypertension-stable on current regimen  Hyperlipidemia-stable but going to stop niacin and Lipitor and see if musculoskeletal pain improves  Plan: Patient is to call me in 3 months and let me know if symptoms have improved by stopping Lipitor. Otherwise see again in 6 months for physical exam.

## 2013-06-06 NOTE — Patient Instructions (Addendum)
Added free T4 and TSH. Stop Lipitor. Stay with same BP meds. Increase lexapro to 20 mg daily. Bone density ordered. Stop Niacin. Return in 6 months.

## 2013-06-11 ENCOUNTER — Ambulatory Visit: Payer: 59 | Admitting: Internal Medicine

## 2013-07-31 DIAGNOSIS — M5417 Radiculopathy, lumbosacral region: Secondary | ICD-10-CM | POA: Insufficient documentation

## 2013-07-31 DIAGNOSIS — M4120 Other idiopathic scoliosis, site unspecified: Secondary | ICD-10-CM | POA: Insufficient documentation

## 2013-08-03 ENCOUNTER — Other Ambulatory Visit: Payer: Self-pay | Admitting: Internal Medicine

## 2013-09-02 ENCOUNTER — Other Ambulatory Visit: Payer: Self-pay | Admitting: Internal Medicine

## 2013-09-16 DIAGNOSIS — M5416 Radiculopathy, lumbar region: Secondary | ICD-10-CM | POA: Insufficient documentation

## 2013-09-18 ENCOUNTER — Telehealth: Payer: Self-pay | Admitting: Internal Medicine

## 2013-09-19 NOTE — Telephone Encounter (Signed)
She should resume cholesterol meds.

## 2013-09-20 NOTE — Telephone Encounter (Signed)
Patient informed. Will resume Lipitor 40 mg daily, but not Niacin. Has recheck in August.

## 2013-10-09 DIAGNOSIS — M4310 Spondylolisthesis, site unspecified: Secondary | ICD-10-CM | POA: Insufficient documentation

## 2013-10-14 ENCOUNTER — Other Ambulatory Visit: Payer: Self-pay | Admitting: Neurosurgery

## 2013-10-21 ENCOUNTER — Other Ambulatory Visit: Payer: Self-pay | Admitting: Internal Medicine

## 2013-10-25 ENCOUNTER — Other Ambulatory Visit: Payer: Self-pay

## 2013-10-25 MED ORDER — LORAZEPAM 1 MG PO TABS: 1.0000 mg | ORAL_TABLET | Freq: Two times a day (BID) | ORAL | Status: DC | PRN

## 2013-10-30 ENCOUNTER — Other Ambulatory Visit: Payer: Self-pay | Admitting: Neurosurgery

## 2013-11-07 ENCOUNTER — Ambulatory Visit (INDEPENDENT_AMBULATORY_CARE_PROVIDER_SITE_OTHER): Payer: 59 | Admitting: Internal Medicine

## 2013-11-07 ENCOUNTER — Encounter: Payer: Self-pay | Admitting: Internal Medicine

## 2013-11-07 VITALS — BP 130/80 | HR 52 | Wt 171.0 lb

## 2013-11-07 DIAGNOSIS — R232 Flushing: Secondary | ICD-10-CM

## 2013-11-07 DIAGNOSIS — N951 Menopausal and female climacteric states: Secondary | ICD-10-CM

## 2013-11-07 NOTE — Progress Notes (Signed)
   Subjective:    Patient ID: Vanessa Harmon, female    DOB: 1950/05/17, 63 y.o.   MRN: [id number]  HPI  Patient complaining bitterly of hot flashes. Says these have been present for 15 years and she's tired. Became menopausal around age 74. Says that she breaks out in a sweat putting on makeup and doing very little activity. Says she's tired but wants something done about it. Patient is scheduled to have back surgery by Dr. Vertell Limber in mid August. Does not have a local GYN physician. Has not had a hysterectomy. Ask her about anxiety with upcoming surgery. She says she doesn't feel all that anxious. TSH was checked several months ago and was within normal limits.    Review of Systems     Objective:   Physical Exam  And examined today. Spent 15 minutes speaking with her. Reviewed results of bone density study as well.      Assessment & Plan:  Hot flashes  Upcoming lumbar surgery L2-L3 XLIF scheduled for August 13  Plan: I have suggested patient see a gynecologist to evaluate  her regarding these persistent hot flashes that have been ongoing for years. I am reluctant to put her on estrogen replacement at this point in time without a GYN opinion

## 2013-11-07 NOTE — Patient Instructions (Addendum)
Patient  would like something done about hot flashes before surgery in mid August. We will have to call and see if a GYN is available to evaluate her.

## 2013-11-11 ENCOUNTER — Other Ambulatory Visit: Payer: Self-pay | Admitting: Internal Medicine

## 2013-11-18 ENCOUNTER — Encounter (HOSPITAL_COMMUNITY): Payer: Self-pay | Admitting: Pharmacy Technician

## 2013-11-20 ENCOUNTER — Encounter (HOSPITAL_COMMUNITY)
Admission: RE | Admit: 2013-11-20 | Discharge: 2013-11-20 | Disposition: A | Discharge status: Home / Self Care | Payer: 59 | Source: Ambulatory Visit | Attending: Neurosurgery | Admitting: Neurosurgery

## 2013-11-20 ENCOUNTER — Encounter (HOSPITAL_COMMUNITY): Payer: Self-pay

## 2013-11-20 ENCOUNTER — Encounter (HOSPITAL_COMMUNITY)
Admission: RE | Admit: 2013-11-20 | Discharge: 2013-11-20 | Disposition: A | Discharge status: Home / Self Care | Payer: 59 | Source: Ambulatory Visit | Attending: Anesthesiology | Admitting: Anesthesiology

## 2013-11-20 DIAGNOSIS — Z01810 Encounter for preprocedural cardiovascular examination: Secondary | ICD-10-CM | POA: Insufficient documentation

## 2013-11-20 DIAGNOSIS — R000 Tachycardia, unspecified: Secondary | ICD-10-CM | POA: Diagnosis not present

## 2013-11-20 DIAGNOSIS — Z01818 Encounter for other preprocedural examination: Secondary | ICD-10-CM | POA: Insufficient documentation

## 2013-11-20 DIAGNOSIS — I10 Essential (primary) hypertension: Secondary | ICD-10-CM | POA: Diagnosis not present

## 2013-11-20 DIAGNOSIS — Z01812 Encounter for preprocedural laboratory examination: Secondary | ICD-10-CM | POA: Insufficient documentation

## 2013-11-20 HISTORY — DX: Pneumonia, unspecified organism: J18.9

## 2013-11-20 HISTORY — DX: Anxiety disorder, unspecified: F41.9

## 2013-11-20 HISTORY — DX: Insomnia, unspecified: G47.00

## 2013-11-20 HISTORY — DX: Other chronic pain: G89.29

## 2013-11-20 HISTORY — DX: Personal history of other infectious and parasitic diseases: Z86.19

## 2013-11-20 HISTORY — DX: Personal history of other diseases of the respiratory system: Z87.09

## 2013-11-20 HISTORY — DX: Unspecified osteoarthritis, unspecified site: M19.90

## 2013-11-20 HISTORY — DX: Personal history of colon polyps, unspecified: Z86.0100

## 2013-11-20 HISTORY — DX: Dorsalgia, unspecified: M54.9

## 2013-11-20 HISTORY — DX: Personal history of colonic polyps: Z86.010

## 2013-11-20 HISTORY — DX: Constipation, unspecified: K59.00

## 2013-11-20 HISTORY — DX: Urgency of urination: R39.15

## 2013-11-20 HISTORY — DX: Personal history of other medical treatment: Z92.89

## 2013-11-20 HISTORY — DX: Pain in unspecified joint: M25.50

## 2013-11-20 HISTORY — DX: Hyperlipidemia, unspecified: E78.5

## 2013-11-20 LAB — CBC
HCT: 41.6 % (ref 36.0–46.0)
Hemoglobin: 13.4 g/dL (ref 12.0–15.0)
MCH: 31.8 pg (ref 26.0–34.0)
MCHC: 32.2 g/dL (ref 30.0–36.0)
MCV: 98.8 fL (ref 78.0–100.0)
Platelets: 222 10*3/uL (ref 150–400)
RBC: 4.21 MIL/uL (ref 3.87–5.11)
RDW: 13.1 % (ref 11.5–15.5)
WBC: 5.8 10*3/uL (ref 4.0–10.5)

## 2013-11-20 LAB — TYPE AND SCREEN
ABO/RH(D): O POS
Antibody Screen: NEGATIVE

## 2013-11-20 LAB — SURGICAL PCR SCREEN
MRSA, PCR: NEGATIVE
Staphylococcus aureus: NEGATIVE

## 2013-11-20 LAB — BASIC METABOLIC PANEL
Anion gap: 11 (ref 5–15)
BUN: 17 mg/dL (ref 6–23)
CO2: 27 mEq/L (ref 19–32)
Calcium: 9.5 mg/dL (ref 8.4–10.5)
Chloride: 99 mEq/L (ref 96–112)
Creatinine, Ser: 0.93 mg/dL (ref 0.50–1.10)
GFR calc Af Amer: 74 mL/min — ABNORMAL LOW (ref >90)
GFR calc non Af Amer: 64 mL/min — ABNORMAL LOW (ref >90)
Glucose, Bld: 92 mg/dL (ref 70–99)
Potassium: 3.8 mEq/L (ref 3.7–5.3)
Sodium: 137 mEq/L (ref 137–147)

## 2013-11-20 NOTE — Progress Notes (Addendum)
Pt doesn't have a cardiologist  Denies ever having a stress test or heart cath  Echo done about a yr and a half ago report in epic   Denies EKG or CXR in past yr   Medical Md is Dr.Mary Runner, broadcasting/film/video

## 2013-11-20 NOTE — Pre-Procedure Instructions (Signed)
Vanessa Harmon  11/20/2013   Your procedure is scheduled on:  Thurs, Aug 13 @ 11:30 AM  Report to [facility]  at 8:30 AM.  Call this number if you have problems the morning of surgery: [phone]   Remember:   Do not eat food or drink liquids after midnight.   Take these medicines the morning of surgery with A SIP OF WATER: Lexapro(Escitalopram),Flonase(Fluticasone),Pain Pill(if needed),Lorazepam(Ativan-if needed),and Metoprolol(Lopressor)               Stop taking your Aspirin,Fish Oil,and any Vitamins. No Goody's,BC's,Aleve,or any Herbal Medications   Do not wear jewelry, make-up or nail polish.  Do not wear lotions, powders, or perfumes. You may wear deodorant.  Do not shave 48 hours prior to surgery. Men may shave face and neck.  Do not bring valuables to the hospital.  [facility] is not responsible                  for any belongings or valuables.               Contacts, dentures or bridgework may not be worn into surgery.  Leave suitcase in the car. After surgery it may be brought to your room.  For patients admitted to the hospital, discharge time is determined by your                treatment team.               Patients discharged the day of surgery will not be allowed to drive  home.    Special Instructions:  [place] - Preparing for Surgery  Before surgery, you can play an important role.  Because skin is not sterile, your skin needs to be as free of germs as possible.  You can reduce the number of germs on you skin by washing with CHG (chlorahexidine gluconate) soap before surgery.  CHG is an antiseptic cleaner which kills germs and bonds with the skin to continue killing germs even after washing.  Please DO NOT use if you have an allergy to CHG or antibacterial soaps.  If your skin becomes reddened/irritated stop using the CHG and inform your nurse when you arrive at Short Stay.  Do not shave (including legs and underarms) for at least 48 hours prior to  the first CHG shower.  You may shave your face.  Please follow these instructions carefully:   1.  Shower with CHG Soap the night before surgery and the                                morning of Surgery.  2.  If you choose to wash your hair, wash your hair first as usual with your       normal shampoo.  3.  After you shampoo, rinse your hair and body thoroughly to remove the                      Shampoo.  4.  Use CHG as you would any other liquid soap.  You can apply chg directly       to the skin and wash gently with scrungie or a clean washcloth.  5.  Apply the CHG Soap to your body ONLY FROM THE NECK DOWN.        Do not use on open wounds or open sores.  Avoid contact with  your eyes,       ears, mouth and genitals (private parts).  Wash genitals (private parts)       with your normal soap.  6.  Wash thoroughly, paying special attention to the area where your surgery        will be performed.  7.  Thoroughly rinse your body with warm water from the neck down.  8.  DO NOT shower/wash with your normal soap after using and rinsing off       the CHG Soap.  9.  Pat yourself dry with a clean towel.            10.  Wear clean pajamas.            11.  Place clean sheets on your bed the night of your first shower and do not        sleep with pets.  Day of Surgery  Do not apply any lotions/deoderants the morning of surgery.  Please wear clean clothes to the hospital/surgery center.     Please read over the following fact sheets that you were given: Pain Booklet, Coughing and Deep Breathing, Blood Transfusion Information, MRSA Information and Surgical Site Infection Prevention

## 2013-11-21 ENCOUNTER — Telehealth: Payer: Self-pay | Admitting: Internal Medicine

## 2013-11-21 ENCOUNTER — Encounter (HOSPITAL_COMMUNITY): Payer: Self-pay

## 2013-11-21 NOTE — Progress Notes (Addendum)
Anesthesia Chart Review:  Patient is a 63 year old female scheduled for exploration and extension of T10 - L3 fusion with L2-3 PLIF on 11/28/13 by Dr. Vertell Limber.  History includes PSVT (had two episodes at age 30 with the onset of menopause, treated with adenosine in the ED, no cardiology referral; no recurrence since), HTN, HLD, anxiety, depression, chronic back pain, inflammatory arthritis (Dr. [name]), former smoker. PCP is Dr. Tedra Senegal who is aware of plans for surgery.  Medications: Vitamin C, ASA 81 mg, Lipitor, Asteline nasal spray, Calcium with D, Lexapro. Fish Oil, Flonase, folic acid, Norco, Indocin, Ativan, losartan-HCTZ, melatonin, methotrexate, Lopressor 25 mg daily, Miralax.   EKG on 11/20/13 showed: Marked SB at 44 bpm, not significantly changed from previous tracing. BP 110/65 and HR was 41 bpm on PAT arrival, 52 bpm on 11/07/13 at Dr. Verlene Mayer office.  Echo on 09/19/12 (ordered by her rheumatologist for systemic sclerosis, evaluate for pulmonary hypertension) showed:  - Left ventricle: The cavity size was normal. Wall thickness was increased in a pattern of mild LVH. Systolic function was normal. The estimated ejection fraction was in the range of 55% to 60%. Wall motion was normal; there were no regional wall motion abnormalities. Doppler parameters are consistent with abnormal left ventricular relaxation (grade 1 diastolic dysfunction). - Mitral valve: Mild regurgitation. - Atrial septum: No defect or patent foramen ovale was identified. - Pulmonary arteries: PA peak pressure: 85mm Hg (S). - Tricuspid valve: Mild regurgitation.  CXR on 11/20/13 showed: No edema or consolidation, minimal RML scarring.  Preoperative labs noted.  Due to patient's low HR, down to 41 bpm, I called and spoke with patient.  She reports her baseline was ~ 60, but when she was started on metoprolol several years ago her heart has stayed in the 40s-50s range. She denies syncope/pre-syncope, SOB, chest pain.  I  reviewed with anesthesiologist Dr. Oletta Lamas who recommended contacting Dr. Renold Genta for additional preoperative recommendations.  I spoke with Dr. Renold Genta who plans to have patient hold her b-blocker for now and follow-up with her on 11/25/13.   [facility] Short Stay Center/Anesthesiology Phone [phone] 11/21/2013 2:47 PM  Addendum: 11/25/2013 4:15 PM I reviewed patient's office note with Dr. Renold Genta from earlier today.  B-block is on hold. HR was 64 bpm.  BP 130/78.

## 2013-11-21 NOTE — Telephone Encounter (Signed)
Call from physician Asst. and anesthesia regarding upcoming back surgery. Patient's heart rate was low today at 41 and she was asymptomatic. She is on a beta blocker. Suggest patient discontinue beta blocker and followup with me on Monday, August 10. Surgery is scheduled for August 13.

## 2013-11-25 ENCOUNTER — Telehealth: Payer: Self-pay | Admitting: Internal Medicine

## 2013-11-25 ENCOUNTER — Ambulatory Visit (INDEPENDENT_AMBULATORY_CARE_PROVIDER_SITE_OTHER): Payer: 59 | Admitting: Internal Medicine

## 2013-11-25 ENCOUNTER — Encounter: Payer: Self-pay | Admitting: Internal Medicine

## 2013-11-25 VITALS — BP 130/78 | HR 64 | Wt 172.0 lb

## 2013-11-25 DIAGNOSIS — N951 Menopausal and female climacteric states: Secondary | ICD-10-CM

## 2013-11-25 DIAGNOSIS — I498 Other specified cardiac arrhythmias: Secondary | ICD-10-CM

## 2013-11-25 DIAGNOSIS — I10 Essential (primary) hypertension: Secondary | ICD-10-CM

## 2013-11-25 DIAGNOSIS — R001 Bradycardia, unspecified: Secondary | ICD-10-CM

## 2013-11-25 DIAGNOSIS — R232 Flushing: Secondary | ICD-10-CM

## 2013-11-25 NOTE — Progress Notes (Signed)
   Subjective:    Patient ID: [name], female    DOB: [birth year], 63 y.o.   MRN: [id number]  HPI 63 year old white female scheduled for back surgery on Thursday, August 13 by Dr. Vertell Limber. Received call from anesthesia preop that she had bradycardia recently. Her beta blocker has been held. Pulse today is 64 off beta blocker. She has remote history of SVT and history of hypertension. An EKG was done preoperatively at the hospital.  Recently has been having severe hot flashes. She was seen at [facility]. Options given were Brisdelle which is Paxil and she is starting on Lexapro. They offered her Neurontin 100 mg 3 times daily. I spoke with Janett Billow at [facility] today. Another option is Duavee. Janett Billow is going to speak with physician assistant and Dr. Benjie Karvonen about this option. It would be my preference that the patient wait couple of months before starting this new medication. She needs to get over her back surgery.    Review of Systems     Objective:   Physical Exam  Blood pressure rechecked left arm 130/78. Pulse is regular. Chest clear. Cardiac exam regular rate and rhythm normal S1 and S2. Extremities without edema      Assessment & Plan:  Hypertension-blood pressure stable off beta blocker  History of SVT-remote  Recent bradycardia on beta blocker-pulse is 64 today  Hot flashes-see below  Plan: Discontinue beta blocker. Patient will monitor her blood pressure and pulse at home. Recommend that she try [facility]. GYN is to get in touch with her about this. She has appointment for followup here in September.

## 2013-11-25 NOTE — Telephone Encounter (Signed)
We'll address at office visit today

## 2013-11-25 NOTE — Telephone Encounter (Signed)
Will address with her.

## 2013-11-25 NOTE — Patient Instructions (Signed)
Stop metoprolol. Consider estrogen replacement per GYN. Keep appointment here in September.

## 2013-11-27 MED ORDER — CEFAZOLIN SODIUM-DEXTROSE 2-3 GM-% IV SOLR
2.0000 g | INTRAVENOUS | Status: AC
  Administered 2013-11-28 (×2): 2 g via INTRAVENOUS
  Filled 2013-11-27: qty 50

## 2013-11-27 NOTE — Telephone Encounter (Signed)
Dr. Renold Genta was to address at patient visit.

## 2013-11-28 ENCOUNTER — Encounter (HOSPITAL_COMMUNITY): Payer: Self-pay

## 2013-11-28 ENCOUNTER — Encounter (HOSPITAL_COMMUNITY)
Admission: RE | Disposition: A | Discharge status: Home / Self Care | Payer: Self-pay | Source: Ambulatory Visit | Attending: Neurosurgery

## 2013-11-28 ENCOUNTER — Inpatient Hospital Stay (HOSPITAL_COMMUNITY): Payer: 59 | Admitting: Certified Registered Nurse Anesthetist

## 2013-11-28 ENCOUNTER — Encounter (HOSPITAL_COMMUNITY): Payer: 59 | Admitting: Vascular Surgery

## 2013-11-28 ENCOUNTER — Other Ambulatory Visit: Payer: Self-pay | Admitting: Internal Medicine

## 2013-11-28 ENCOUNTER — Inpatient Hospital Stay (HOSPITAL_COMMUNITY): Payer: 59

## 2013-11-28 ENCOUNTER — Inpatient Hospital Stay (HOSPITAL_COMMUNITY)
Admission: RE | Admit: 2013-11-28 | Discharge: 2013-11-30 | DRG: 458 | Disposition: A | Discharge status: Home / Self Care | Payer: 59 | Source: Ambulatory Visit | Attending: Neurosurgery | Admitting: Neurosurgery

## 2013-11-28 DIAGNOSIS — M5126 Other intervertebral disc displacement, lumbar region: Secondary | ICD-10-CM | POA: Diagnosis present

## 2013-11-28 DIAGNOSIS — M4120 Other idiopathic scoliosis, site unspecified: Secondary | ICD-10-CM | POA: Diagnosis present

## 2013-11-28 DIAGNOSIS — Q762 Congenital spondylolisthesis: Secondary | ICD-10-CM

## 2013-11-28 DIAGNOSIS — M4130 Thoracogenic scoliosis, site unspecified: Secondary | ICD-10-CM | POA: Diagnosis present

## 2013-11-28 DIAGNOSIS — M549 Dorsalgia, unspecified: Secondary | ICD-10-CM | POA: Diagnosis present

## 2013-11-28 DIAGNOSIS — M419 Scoliosis, unspecified: Secondary | ICD-10-CM | POA: Diagnosis present

## 2013-11-28 SURGERY — POSTERIOR LUMBAR FUSION 1 LEVEL
Anesthesia: General | Site: Back

## 2013-11-28 MED ORDER — FLUTICASONE PROPIONATE 50 MCG/ACT NA SUSP
1.0000 | Freq: Every day | NASAL | Status: DC
  Administered 2013-11-28 – 2013-11-29 (×2): 1 via NASAL
  Filled 2013-11-28: qty 16

## 2013-11-28 MED ORDER — PROMETHAZINE HCL 25 MG/ML IJ SOLN: 6.2500 mg | INTRAMUSCULAR | Status: DC | PRN

## 2013-11-28 MED ORDER — LOSARTAN POTASSIUM-HCTZ 100-12.5 MG PO TABS: 1.0000 | ORAL_TABLET | Freq: Every day | ORAL | Status: DC

## 2013-11-28 MED ORDER — 0.9 % SODIUM CHLORIDE (POUR BTL) OPTIME
TOPICAL | Status: DC | PRN
  Administered 2013-11-28: 1000 mL

## 2013-11-28 MED ORDER — PHENOL 1.4 % MT LIQD
1.0000 | OROMUCOSAL | Status: DC | PRN
Start: 2013-11-28 — End: 2013-11-30

## 2013-11-28 MED ORDER — ONDANSETRON HCL 4 MG/2ML IJ SOLN
INTRAMUSCULAR | Status: AC
  Filled 2013-11-28: qty 2

## 2013-11-28 MED ORDER — THROMBIN 20000 UNITS EX SOLR
CUTANEOUS | Status: DC | PRN
  Administered 2013-11-28: 14:00:00 via TOPICAL

## 2013-11-28 MED ORDER — SENNA 8.6 MG PO TABS
1.0000 | ORAL_TABLET | Freq: Two times a day (BID) | ORAL | Status: DC
  Administered 2013-11-29 (×2): 8.6 mg via ORAL
  Filled 2013-11-28 (×4): qty 1

## 2013-11-28 MED ORDER — NEOSTIGMINE METHYLSULFATE 10 MG/10ML IV SOLN
INTRAVENOUS | Status: DC | PRN
  Administered 2013-11-28: 4 mg via INTRAVENOUS

## 2013-11-28 MED ORDER — ACETAMINOPHEN 650 MG RE SUPP: 650.0000 mg | RECTAL | Status: DC | PRN

## 2013-11-28 MED ORDER — CEFAZOLIN SODIUM-DEXTROSE 2-3 GM-% IV SOLR
INTRAVENOUS | Status: AC
  Filled 2013-11-28: qty 100

## 2013-11-28 MED ORDER — ACETAMINOPHEN 325 MG PO TABS: 650.0000 mg | ORAL_TABLET | ORAL | Status: DC | PRN

## 2013-11-28 MED ORDER — DOCUSATE SODIUM 100 MG PO CAPS
100.0000 mg | ORAL_CAPSULE | Freq: Two times a day (BID) | ORAL | Status: DC
  Administered 2013-11-28 – 2013-11-29 (×3): 100 mg via ORAL
  Filled 2013-11-28 (×5): qty 1

## 2013-11-28 MED ORDER — METHOCARBAMOL 500 MG PO TABS
ORAL_TABLET | ORAL | Status: AC
Start: 2013-11-28 — End: 2013-11-29
  Filled 2013-11-28: qty 1

## 2013-11-28 MED ORDER — METHOTREXATE SODIUM CHEMO INJECTION 25 MG/ML
15.0000 mg | INTRAMUSCULAR | Status: DC
  Filled 2013-11-28: qty 0.6

## 2013-11-28 MED ORDER — HYDROCHLOROTHIAZIDE 12.5 MG PO CAPS
12.5000 mg | ORAL_CAPSULE | Freq: Every day | ORAL | Status: DC
  Administered 2013-11-28 – 2013-11-29 (×2): 12.5 mg via ORAL
  Filled 2013-11-28 (×3): qty 1

## 2013-11-28 MED ORDER — HYDROMORPHONE HCL PF 1 MG/ML IJ SOLN
INTRAMUSCULAR | Status: AC
  Filled 2013-11-28: qty 1

## 2013-11-28 MED ORDER — METHOCARBAMOL 1000 MG/10ML IJ SOLN
500.0000 mg | Freq: Four times a day (QID) | INTRAMUSCULAR | Status: DC | PRN
  Filled 2013-11-28: qty 5

## 2013-11-28 MED ORDER — ARTIFICIAL TEARS OP OINT
TOPICAL_OINTMENT | OPHTHALMIC | Status: DC | PRN
  Administered 2013-11-28: 1 via OPHTHALMIC

## 2013-11-28 MED ORDER — BISACODYL 10 MG RE SUPP: 10.0000 mg | Freq: Every day | RECTAL | Status: DC | PRN

## 2013-11-28 MED ORDER — PROPOFOL 10 MG/ML IV BOLUS
INTRAVENOUS | Status: DC | PRN
Start: 2013-11-28 — End: 2013-11-28
  Administered 2013-11-28: 150 mg via INTRAVENOUS

## 2013-11-28 MED ORDER — PROPOFOL 10 MG/ML IV BOLUS
INTRAVENOUS | Status: AC
  Filled 2013-11-28: qty 20

## 2013-11-28 MED ORDER — ZOLPIDEM TARTRATE 5 MG PO TABS: 5.0000 mg | ORAL_TABLET | Freq: Every evening | ORAL | Status: DC | PRN

## 2013-11-28 MED ORDER — FLEET ENEMA 7-19 GM/118ML RE ENEM
1.0000 | ENEMA | Freq: Once | RECTAL | Status: AC | PRN
  Filled 2013-11-28: qty 1

## 2013-11-28 MED ORDER — OXYCODONE-ACETAMINOPHEN 5-325 MG PO TABS
ORAL_TABLET | ORAL | Status: AC
  Filled 2013-11-28: qty 1

## 2013-11-28 MED ORDER — ROCURONIUM BROMIDE 100 MG/10ML IV SOLN
INTRAVENOUS | Status: DC | PRN
  Administered 2013-11-28: 10 mg via INTRAVENOUS
  Administered 2013-11-28: 20 mg via INTRAVENOUS
  Administered 2013-11-28: 50 mg via INTRAVENOUS

## 2013-11-28 MED ORDER — ALUM & MAG HYDROXIDE-SIMETH 200-200-20 MG/5ML PO SUSP: 30.0000 mL | Freq: Four times a day (QID) | ORAL | Status: DC | PRN

## 2013-11-28 MED ORDER — OXYCODONE-ACETAMINOPHEN 5-325 MG PO TABS
1.0000 | ORAL_TABLET | ORAL | Status: DC | PRN
  Administered 2013-11-28 – 2013-11-30 (×10): 2 via ORAL
  Filled 2013-11-28 (×9): qty 2

## 2013-11-28 MED ORDER — AZELASTINE HCL 0.1 % NA SOLN
1.0000 | Freq: Every day | NASAL | Status: DC
  Administered 2013-11-28 – 2013-11-29 (×2): 1 via NASAL
  Filled 2013-11-28: qty 30

## 2013-11-28 MED ORDER — SENNOSIDES-DOCUSATE SODIUM 8.6-50 MG PO TABS
1.0000 | ORAL_TABLET | Freq: Every evening | ORAL | Status: DC | PRN
  Filled 2013-11-28: qty 1

## 2013-11-28 MED ORDER — LORAZEPAM 0.5 MG PO TABS: 1.0000 mg | ORAL_TABLET | Freq: Two times a day (BID) | ORAL | Status: DC | PRN

## 2013-11-28 MED ORDER — FENTANYL CITRATE 0.05 MG/ML IJ SOLN
INTRAMUSCULAR | Status: AC
  Filled 2013-11-28: qty 5

## 2013-11-28 MED ORDER — LACTATED RINGERS IV SOLN
INTRAVENOUS | Status: DC
  Administered 2013-11-28: 10:00:00 via INTRAVENOUS

## 2013-11-28 MED ORDER — MIDAZOLAM HCL 2 MG/2ML IJ SOLN
INTRAMUSCULAR | Status: AC
  Filled 2013-11-28: qty 2

## 2013-11-28 MED ORDER — ONDANSETRON HCL 4 MG/2ML IJ SOLN
INTRAMUSCULAR | Status: DC | PRN
  Administered 2013-11-28: 4 mg via INTRAVENOUS

## 2013-11-28 MED ORDER — ATORVASTATIN CALCIUM 40 MG PO TABS
40.0000 mg | ORAL_TABLET | Freq: Every day | ORAL | Status: DC
  Administered 2013-11-28 – 2013-11-29 (×2): 40 mg via ORAL
  Filled 2013-11-28 (×3): qty 1

## 2013-11-28 MED ORDER — HYDROMORPHONE HCL PF 1 MG/ML IJ SOLN: 0.2500 mg | INTRAMUSCULAR | Status: DC | PRN

## 2013-11-28 MED ORDER — ROCURONIUM BROMIDE 50 MG/5ML IV SOLN
INTRAVENOUS | Status: AC
  Filled 2013-11-28: qty 1

## 2013-11-28 MED ORDER — CEFAZOLIN SODIUM 1-5 GM-% IV SOLN
1.0000 g | Freq: Three times a day (TID) | INTRAVENOUS | Status: AC
  Administered 2013-11-28 – 2013-11-29 (×2): 1 g via INTRAVENOUS
  Filled 2013-11-28 (×2): qty 50

## 2013-11-28 MED ORDER — ESCITALOPRAM OXALATE 20 MG PO TABS
20.0000 mg | ORAL_TABLET | Freq: Every day | ORAL | Status: DC
  Administered 2013-11-29: 20 mg via ORAL
  Filled 2013-11-28 (×3): qty 1

## 2013-11-28 MED ORDER — FENTANYL CITRATE 0.05 MG/ML IJ SOLN
INTRAMUSCULAR | Status: DC | PRN
  Administered 2013-11-28 (×5): 50 ug via INTRAVENOUS

## 2013-11-28 MED ORDER — HYDROCODONE-ACETAMINOPHEN 5-325 MG PO TABS: 1.0000 | ORAL_TABLET | ORAL | Status: DC | PRN

## 2013-11-28 MED ORDER — HYDROCODONE-ACETAMINOPHEN 10-325 MG PO TABS: 1.0000 | ORAL_TABLET | Freq: Four times a day (QID) | ORAL | Status: DC | PRN

## 2013-11-28 MED ORDER — MIDAZOLAM HCL 5 MG/5ML IJ SOLN
INTRAMUSCULAR | Status: DC | PRN
  Administered 2013-11-28: 2 mg via INTRAVENOUS

## 2013-11-28 MED ORDER — LIDOCAINE HCL (CARDIAC) 20 MG/ML IV SOLN
INTRAVENOUS | Status: AC
  Filled 2013-11-28: qty 5

## 2013-11-28 MED ORDER — LIDOCAINE HCL (CARDIAC) 20 MG/ML IV SOLN
INTRAVENOUS | Status: DC | PRN
  Administered 2013-11-28: 60 mg via INTRAVENOUS

## 2013-11-28 MED ORDER — CALCIUM CARBONATE-VITAMIN D 250-125 MG-UNIT PO TABS: 1.0000 | ORAL_TABLET | Freq: Every day | ORAL | Status: DC

## 2013-11-28 MED ORDER — INDOMETHACIN ER 75 MG PO CPCR
75.0000 mg | ORAL_CAPSULE | Freq: Every day | ORAL | Status: DC
  Filled 2013-11-28 (×3): qty 1

## 2013-11-28 MED ORDER — BUPIVACAINE HCL (PF) 0.5 % IJ SOLN
INTRAMUSCULAR | Status: DC | PRN
  Administered 2013-11-28: 8.5 mL

## 2013-11-28 MED ORDER — OXYCODONE HCL 5 MG PO TABS
5.0000 mg | ORAL_TABLET | Freq: Once | ORAL | Status: AC | PRN
  Administered 2013-11-28: 5 mg via ORAL

## 2013-11-28 MED ORDER — METHOCARBAMOL 500 MG PO TABS
ORAL_TABLET | ORAL | Status: AC
  Filled 2013-11-28: qty 1

## 2013-11-28 MED ORDER — POLYETHYLENE GLYCOL 3350 17 G PO PACK
17.0000 g | PACK | Freq: Every day | ORAL | Status: DC | PRN
  Filled 2013-11-28: qty 1

## 2013-11-28 MED ORDER — GLYCOPYRROLATE 0.2 MG/ML IJ SOLN
INTRAMUSCULAR | Status: DC | PRN
  Administered 2013-11-28: .8 mg via INTRAVENOUS

## 2013-11-28 MED ORDER — PANTOPRAZOLE SODIUM 40 MG IV SOLR
40.0000 mg | Freq: Every day | INTRAVENOUS | Status: DC
  Administered 2013-11-28 – 2013-11-29 (×2): 40 mg via INTRAVENOUS
  Filled 2013-11-28 (×3): qty 40

## 2013-11-28 MED ORDER — MORPHINE SULFATE 2 MG/ML IJ SOLN
1.0000 mg | INTRAMUSCULAR | Status: DC | PRN
  Administered 2013-11-29: 2 mg via INTRAVENOUS
  Filled 2013-11-28: qty 1

## 2013-11-28 MED ORDER — SODIUM CHLORIDE 0.9 % IJ SOLN: 3.0000 mL | INTRAMUSCULAR | Status: DC | PRN

## 2013-11-28 MED ORDER — MELATONIN 10 MG PO CAPS: 10.0000 mg | ORAL_CAPSULE | Freq: Every day | ORAL | Status: DC

## 2013-11-28 MED ORDER — LOSARTAN POTASSIUM 50 MG PO TABS
100.0000 mg | ORAL_TABLET | Freq: Every day | ORAL | Status: DC
  Administered 2013-11-28 – 2013-11-29 (×2): 100 mg via ORAL
  Filled 2013-11-28 (×3): qty 2

## 2013-11-28 MED ORDER — OXYCODONE HCL 5 MG/5ML PO SOLN: 5.0000 mg | Freq: Once | ORAL | Status: AC | PRN

## 2013-11-28 MED ORDER — LIDOCAINE-EPINEPHRINE 1 %-1:100000 IJ SOLN
INTRAMUSCULAR | Status: DC | PRN
  Administered 2013-11-28: 8.5 mL

## 2013-11-28 MED ORDER — HYDROMORPHONE HCL PF 1 MG/ML IJ SOLN
0.2500 mg | INTRAMUSCULAR | Status: DC | PRN
  Administered 2013-11-28: 0.5 mg via INTRAVENOUS
  Administered 2013-11-28: 1 mg via INTRAVENOUS
  Administered 2013-11-28: 0.5 mg via INTRAVENOUS
  Administered 2013-11-28: 1 mg via INTRAVENOUS

## 2013-11-28 MED ORDER — OXYCODONE HCL 5 MG PO TABS
ORAL_TABLET | ORAL | Status: AC
  Filled 2013-11-28: qty 1

## 2013-11-28 MED ORDER — MENTHOL 3 MG MT LOZG: 1.0000 | LOZENGE | OROMUCOSAL | Status: DC | PRN

## 2013-11-28 MED ORDER — FOLIC ACID 1 MG PO TABS
1.0000 mg | ORAL_TABLET | Freq: Every day | ORAL | Status: DC
  Administered 2013-11-29: 1 mg via ORAL
  Filled 2013-11-28 (×3): qty 1

## 2013-11-28 MED ORDER — ONDANSETRON HCL 4 MG/2ML IJ SOLN: 4.0000 mg | INTRAMUSCULAR | Status: DC | PRN

## 2013-11-28 MED ORDER — EPHEDRINE SULFATE 50 MG/ML IJ SOLN
INTRAMUSCULAR | Status: DC | PRN
  Administered 2013-11-28 (×4): 5 mg via INTRAVENOUS

## 2013-11-28 MED ORDER — VITAMIN C 500 MG PO TABS
1000.0000 mg | ORAL_TABLET | Freq: Every day | ORAL | Status: DC
  Administered 2013-11-29: 1000 mg via ORAL
  Filled 2013-11-28 (×3): qty 2

## 2013-11-28 MED ORDER — KCL IN DEXTROSE-NACL 20-5-0.45 MEQ/L-%-% IV SOLN
INTRAVENOUS | Status: DC
  Filled 2013-11-28 (×4): qty 1000

## 2013-11-28 MED ORDER — METHOCARBAMOL 500 MG PO TABS
500.0000 mg | ORAL_TABLET | Freq: Four times a day (QID) | ORAL | Status: DC | PRN
  Administered 2013-11-28 – 2013-11-30 (×5): 500 mg via ORAL
  Filled 2013-11-28 (×5): qty 1

## 2013-11-28 MED ORDER — SODIUM CHLORIDE 0.9 % IJ SOLN
3.0000 mL | Freq: Two times a day (BID) | INTRAMUSCULAR | Status: DC
  Administered 2013-11-28 – 2013-11-29 (×3): 3 mL via INTRAVENOUS

## 2013-11-28 MED ORDER — LACTATED RINGERS IV SOLN
INTRAVENOUS | Status: DC | PRN
  Administered 2013-11-28 (×2): via INTRAVENOUS

## 2013-11-28 MED ORDER — CALCIUM CARBONATE-VITAMIN D 500-200 MG-UNIT PO TABS
1.0000 | ORAL_TABLET | Freq: Every day | ORAL | Status: DC
  Administered 2013-11-29: 1 via ORAL
  Filled 2013-11-28 (×4): qty 1

## 2013-11-28 SURGICAL SUPPLY — 87 items
ADH SKN CLS APL DERMABOND .7 (GAUZE/BANDAGES/DRESSINGS) ×6
ADH SKN CLS LQ APL DERMABOND (GAUZE/BANDAGES/DRESSINGS) ×2
APL SKNCLS STERI-STRIP NONHPOA (GAUZE/BANDAGES/DRESSINGS)
BAG DECANTER FOR FLEXI CONT (MISCELLANEOUS) ×6 IMPLANT
BENZOIN TINCTURE PRP APPL 2/3 (GAUZE/BANDAGES/DRESSINGS) IMPLANT
BLADE SURG ROTATE 9660 (MISCELLANEOUS) IMPLANT
BONE MATRIX OSTEOCEL PRO MED (Bone Implant) ×4 IMPLANT
BUR MATCHSTICK NEURO 3.0 LAGG (BURR) ×3 IMPLANT
BUR PRECISION FLUTE 5.0 (BURR) ×3 IMPLANT
CANISTER SUCT 3000ML (MISCELLANEOUS) ×3 IMPLANT
CONNECTOR ROD-ROD 5.5H-5.5 (Connector) ×4 IMPLANT
CONT SPEC 4OZ CLIKSEAL STRL BL (MISCELLANEOUS) ×6 IMPLANT
COVER BACK TABLE 24X17X13 BIG (DRAPES) IMPLANT
COVER TABLE BACK 60X90 (DRAPES) ×3 IMPLANT
DERMABOND ADHESIVE PROPEN (GAUZE/BANDAGES/DRESSINGS) ×1
DERMABOND ADVANCED (GAUZE/BANDAGES/DRESSINGS) ×3
DERMABOND ADVANCED .7 DNX12 (GAUZE/BANDAGES/DRESSINGS) ×6 IMPLANT
DERMABOND ADVANCED .7 DNX6 (GAUZE/BANDAGES/DRESSINGS) ×1 IMPLANT
DRAPE C-ARM 42X72 X-RAY (DRAPES) ×9 IMPLANT
DRAPE C-ARMOR (DRAPES) ×3 IMPLANT
DRAPE LAPAROTOMY 100X72X124 (DRAPES) ×6 IMPLANT
DRAPE POUCH INSTRU U-SHP 10X18 (DRAPES) ×6 IMPLANT
DRAPE SURG 17X23 STRL (DRAPES) ×3 IMPLANT
DRSG TELFA 3X8 NADH (GAUZE/BANDAGES/DRESSINGS) IMPLANT
DURAPREP 26ML APPLICATOR (WOUND CARE) ×6 IMPLANT
ELECT REM PT RETURN 9FT ADLT (ELECTROSURGICAL) ×6
ELECTRODE REM PT RTRN 9FT ADLT (ELECTROSURGICAL) ×4 IMPLANT
EVACUATOR 1/8 PVC DRAIN (DRAIN) IMPLANT
GAUZE SPONGE 4X4 12PLY STRL (GAUZE/BANDAGES/DRESSINGS) ×3 IMPLANT
GAUZE SPONGE 4X4 16PLY XRAY LF (GAUZE/BANDAGES/DRESSINGS) IMPLANT
GLOVE BIO SURGEON STRL SZ8 (GLOVE) ×9 IMPLANT
GLOVE BIOGEL PI IND STRL 7.5 (GLOVE) ×1 IMPLANT
GLOVE BIOGEL PI IND STRL 8 (GLOVE) ×6 IMPLANT
GLOVE BIOGEL PI IND STRL 8.5 (GLOVE) ×6 IMPLANT
GLOVE BIOGEL PI INDICATOR 7.5 (GLOVE) ×1
GLOVE BIOGEL PI INDICATOR 8 (GLOVE) ×3
GLOVE BIOGEL PI INDICATOR 8.5 (GLOVE) ×3
GLOVE ECLIPSE 7.5 STRL STRAW (GLOVE) ×9 IMPLANT
GLOVE ECLIPSE 8.0 STRL XLNG CF (GLOVE) ×8 IMPLANT
GLOVE EXAM NITRILE LRG STRL (GLOVE) IMPLANT
GLOVE EXAM NITRILE MD LF STRL (GLOVE) IMPLANT
GLOVE EXAM NITRILE XL STR (GLOVE) IMPLANT
GLOVE EXAM NITRILE XS STR PU (GLOVE) IMPLANT
GOWN STRL REUS W/ TWL LRG LVL3 (GOWN DISPOSABLE) ×1 IMPLANT
GOWN STRL REUS W/ TWL XL LVL3 (GOWN DISPOSABLE) ×8 IMPLANT
GOWN STRL REUS W/TWL 2XL LVL3 (GOWN DISPOSABLE) ×6 IMPLANT
GOWN STRL REUS W/TWL LRG LVL3 (GOWN DISPOSABLE) ×3
GOWN STRL REUS W/TWL XL LVL3 (GOWN DISPOSABLE) ×12
KIT BASIN OR (CUSTOM PROCEDURE TRAY) ×6 IMPLANT
KIT POSITION SURG JACKSON T1 (MISCELLANEOUS) ×3 IMPLANT
KIT ROOM TURNOVER OR (KITS) ×6 IMPLANT
MILL MEDIUM DISP (BLADE) ×3 IMPLANT
NDL HYPO 25X1 1.5 SAFETY (NEEDLE) ×2 IMPLANT
NDL SPNL 18GX3.5 QUINCKE PK (NEEDLE) IMPLANT
NEEDLE HYPO 25X1 1.5 SAFETY (NEEDLE) ×6 IMPLANT
NEEDLE SPNL 18GX3.5 QUINCKE PK (NEEDLE) IMPLANT
NS IRRIG 1000ML POUR BTL (IV SOLUTION) ×6 IMPLANT
PACK LAMINECTOMY NEURO (CUSTOM PROCEDURE TRAY) ×6 IMPLANT
PAD ARMBOARD 7.5X6 YLW CONV (MISCELLANEOUS) ×9 IMPLANT
PAD DRESSING TELFA 3X8 NADH (GAUZE/BANDAGES/DRESSINGS) IMPLANT
PATTIES SURGICAL .5 X.5 (GAUZE/BANDAGES/DRESSINGS) IMPLANT
PATTIES SURGICAL .5 X1 (DISPOSABLE) IMPLANT
PATTIES SURGICAL 1X1 (DISPOSABLE) IMPLANT
ROD PREBENT LATERAL OFFSET 5.5 (Rod) ×6 IMPLANT
SCREW LOCK (Screw) ×12 IMPLANT
SCREW LOCK 100X5.5X OPN (Screw) ×4 IMPLANT
SCREW LOCK ARMT15T ILIAC (Screw) ×4 IMPLANT
SCREW POLYAXIAL ARMT15T 5X50MM (Screw) ×4 IMPLANT
SPONGE LAP 4X18 X RAY DECT (DISPOSABLE) IMPLANT
SPONGE SURGIFOAM ABS GEL 100 (HEMOSTASIS) ×3 IMPLANT
SPONGE SURGIFOAM ABS GEL SZ50 (HEMOSTASIS) IMPLANT
STAPLER SKIN PROX WIDE 3.9 (STAPLE) ×3 IMPLANT
STRIP CLOSURE SKIN 1/2X4 (GAUZE/BANDAGES/DRESSINGS) ×3 IMPLANT
SUT VIC AB 1 CT1 18XBRD ANBCTR (SUTURE) ×6 IMPLANT
SUT VIC AB 1 CT1 8-18 (SUTURE) ×9
SUT VIC AB 2-0 CT1 18 (SUTURE) ×9 IMPLANT
SUT VIC AB 3-0 SH 8-18 (SUTURE) ×9 IMPLANT
SYR 20CC LL (SYRINGE) ×3 IMPLANT
SYR 20ML ECCENTRIC (SYRINGE) ×3 IMPLANT
SYR 3ML LL SCALE MARK (SYRINGE) ×12 IMPLANT
SYR 5ML LL (SYRINGE) IMPLANT
TAPE CLOTH 3X10 TAN LF (GAUZE/BANDAGES/DRESSINGS) ×9 IMPLANT
TOWEL OR 17X24 6PK STRL BLUE (TOWEL DISPOSABLE) ×6 IMPLANT
TOWEL OR 17X26 10 PK STRL BLUE (TOWEL DISPOSABLE) ×6 IMPLANT
TRAP SPECIMEN MUCOUS 40CC (MISCELLANEOUS) ×3 IMPLANT
TRAY FOLEY CATH 14FRSI W/METER (CATHETERS) ×6 IMPLANT
WATER STERILE IRR 1000ML POUR (IV SOLUTION) ×6 IMPLANT

## 2013-11-28 NOTE — Op Note (Signed)
11/28/2013  5:51 PM  PATIENT:  Vanessa Harmon  63 y.o. female  PRE-OPERATIVE DIAGNOSIS:  Thoracogenic scoliosis, Spondylolisthesis, Scoliosis of Lumbar region, herniated disc L 23, Radiculopathy  POST-OPERATIVE DIAGNOSIS:  Thoracogenic scoliosis, Spondylolisthesis, Scoliosis of Lumbar region, herniated disc L 23, Radiculopathy  PROCEDURE:  Procedure(s) with comments: Exploration of fusion with decompression and fusion of Lumbar 2-3 with pedicle screw fixation and posterolateral arthrodesis (N/A) - Exploration of fusion with decompression and fusion of Lumbar 2-3 with pedicle screw fixation and posterolateral arthrodesis with discectomy, redo laminectomy  SURGEON:  Surgeon(s) and Role:    * Erline Levine, MD - Primary    * Faythe Ghee, MD - Assisting  PHYSICIAN ASSISTANT:   ASSISTANTS: [place], RN   ANESTHESIA:   general  EBL:  Total I/O In: 1800 [I.V.:1800] Out: 790 [Urine:290; Blood:500]  BLOOD ADMINISTERED:none  DRAINS: none   LOCAL MEDICATIONS USED:  LIDOCAINE   SPECIMEN:  No Specimen  DISPOSITION OF SPECIMEN:  N/A  COUNTS:  YES  TOURNIQUET:  * No tourniquets in log *  DICTATION: DICTATION: Patient is63 year old woman with prior fusion at L3-S1 with lumbar scoliosis and collapse of L23 level with scoliosis affecting right greater than left leg. She has degeneration at L2/3 with a ruptured disc and right hip flexor weakness. .it was elected to take her to surgery for exploration of prior fusion with decompression and fusion at the L 23 level.   Procedure: Patient was placed in a prone position on the [place] table after smooth and uncomplicated induction of general endotracheal anesthesia. Her low back was prepped and draped in usual sterile fashion with betadine scrub and DuraPrep. Area of incision was infiltrated with local lidocaine. Localizing radiograph was performed prior to incision.  Incision was made to the lumbodorsal fascia was incised and exposure was  performed of the previously placed hardware, which was exposed.  There did not appear to be any loosening of screws and after careful exploration, there appeared to be good bridging bone growth with no mobility at the previously operated level. Intraoperative x-ray was obtained which confirmed correct orientation with marker probes at the L4 pedicle and overlying the L3 transverse process. A total redo laminectomy of L3 was performed with disarticulation of the facet joints at this level and thorough decompression was performed of both L2 and L3  nerve roots along with the common dural tube. There was a tremendous amount of scar tissue which was carefully decompressed and discectomy was performed on the right using the microscope with decompression of the L 2 and L 3 nerve roots and removal of herniated disc material. I was unable to mobilize the thecal sac due to scarring, so consequrntly did not perform PLIF procedure. The posterolateral region was extensively decorticated and pedicle probes were placed at L2 and 5.0 x 50 mm screws were placed at L2 bilaterally. Intraoperative fluoroscopy confirmed correct orientationin the AP and lateral plane. Offset connectors were placed on the rods, between L 2 and L 3 on the right and L 3 and L4 on the left and contoured rods were affixed to the L 2 screws.   Final x-rays demonstrated well-positioned interbody graft and pedicle screw fixation. The posterolateral region was packed with 45 cc of bone auto and allograft bone graft from L 2 through 4 on the left and L 2 through 3 on the right.  Fascia was closed with 1 Vicryl sutures skin edges were reapproximated 2 and 3-0 Vicryl sutures. The wound is dressed with Dermabond  and an occlusive dressing.  the patient was extubated in the operating room and taken to recovery in stable satisfactory condition. Counts were correct at the end of the case.    PLAN OF CARE: Admit to inpatient   PATIENT DISPOSITION:  PACU -  hemodynamically stable.   Delay start of Pharmacological VTE agent (>24hrs) due to surgical blood loss or risk of bleeding: yes

## 2013-11-28 NOTE — Anesthesia Postprocedure Evaluation (Signed)
  Anesthesia Post-op Note  Patient: Vanessa Harmon  Procedure(s) Performed: Procedure(s) with comments: Exploration of fusion with decompression and fusion of Lumbar 2-3 with pedicle screw fixation and posterolateral arthrodesis (N/A) - Exploration of fusion with decompression and fusion of Lumbar 2-3 with pedicle screw fixation and posterolateral arthrodesis  Patient Location: PACU  Anesthesia Type:General  Level of Consciousness: awake, alert  and oriented  Airway and Oxygen Therapy: Patient Spontanous Breathing and Patient connected to nasal cannula oxygen  Post-op Pain: mild  Post-op Assessment: Post-op Vital signs reviewed, Patient's Cardiovascular Status Stable, Respiratory Function Stable, Patent Airway and Pain level controlled  Post-op Vital Signs: stable  Last Vitals:  Filed Vitals:   11/28/13 1815  BP:   Pulse: 74  Temp:   Resp: 10    Complications: No apparent anesthesia complications

## 2013-11-28 NOTE — Interval H&P Note (Signed)
History and Physical Interval Note:  11/28/2013 11:31 AM  Vanessa Harmon  has presented today for surgery, with the diagnosis of Thoracogenic scoliosis, Spondylolisthesis, Scoliosis of Lumbar region, Radiculopathy  The various methods of treatment have been discussed with the patient and family. After consideration of risks, benefits and other options for treatment, the patient has consented to  Procedure(s): Lumbar decompression and fusion L 2 through 3 with exploration of fusion and posterolateral arthrodesis as a surgical intervention .  The patient's history has been reviewed, patient examined, no change in status, stable for surgery.  I have reviewed the patient's chart and labs.  Questions were answered to the patient's satisfaction.     [name]

## 2013-11-28 NOTE — Plan of Care (Signed)
Problem: Consults Goal: Diagnosis - Spinal Surgery Outcome: Completed/Met Date Met:  11/28/13 Thoraco/Lumbar Spine Fusion

## 2013-11-28 NOTE — Brief Op Note (Signed)
11/28/2013  5:51 PM  PATIENT:  Vanessa Harmon  63 y.o. female  PRE-OPERATIVE DIAGNOSIS:  Thoracogenic scoliosis, Spondylolisthesis, Scoliosis of Lumbar region, herniated disc L 23, Radiculopathy  POST-OPERATIVE DIAGNOSIS:  Thoracogenic scoliosis, Spondylolisthesis, Scoliosis of Lumbar region, herniated disc L 23, Radiculopathy  PROCEDURE:  Procedure(s) with comments: Exploration of fusion with decompression and fusion of Lumbar 2-3 with pedicle screw fixation and posterolateral arthrodesis (N/A) - Exploration of fusion with decompression and fusion of Lumbar 2-3 with pedicle screw fixation and posterolateral arthrodesis with discectomy, redo laminectomy  SURGEON:  Surgeon(s) and Role:    * Erline Levine, MD - Primary    * Faythe Ghee, MD - Assisting  PHYSICIAN ASSISTANT:   ASSISTANTS: Poteat, RN   ANESTHESIA:   general  EBL:  Total I/O In: 1800 [I.V.:1800] Out: 790 [Urine:290; Blood:500]  BLOOD ADMINISTERED:none  DRAINS: none   LOCAL MEDICATIONS USED:  LIDOCAINE   SPECIMEN:  No Specimen  DISPOSITION OF SPECIMEN:  N/A  COUNTS:  YES  TOURNIQUET:  * No tourniquets in log *  DICTATION: DICTATION: Patient is89 year old woman with prior fusion at L3-S1 with lumbar scoliosis and collapse of L23 level with scoliosis affecting right greater than left leg. She has degeneration at L2/3 with a ruptured disc and right hip flexor weakness. .it was elected to take her to surgery for exploration of prior fusion with decompression and fusion at the L 23 level.   Procedure: Patient was placed in a prone position on the [place] table after smooth and uncomplicated induction of general endotracheal anesthesia. Her low back was prepped and draped in usual sterile fashion with betadine scrub and DuraPrep. Area of incision was infiltrated with local lidocaine. Localizing radiograph was performed prior to incision.  Incision was made to the lumbodorsal fascia was incised and exposure was  performed of the previously placed hardware, which was exposed.  There did not appear to be any loosening of screws and after careful exploration, there appeared to be good bridging bone growth with no mobility at the previously operated level. Intraoperative x-ray was obtained which confirmed correct orientation with marker probes at the L4 pedicle and overlying the L3 transverse process. A total redo laminectomy of L3 was performed with disarticulation of the facet joints at this level and thorough decompression was performed of both L2 and L3  nerve roots along with the common dural tube. There was a tremendous amount of scar tissue which was carefully decompressed and discectomy was performed on the right using the microscope with decompression of the L 2 and L 3 nerve roots and removal of herniated disc material. I was unable to mobilize the thecal sac due to scarring, so consequrntly did not perform PLIF procedure. The posterolateral region was extensively decorticated and pedicle probes were placed at L2 and 5.0 x 50 mm screws were placed at L2 bilaterally. Intraoperative fluoroscopy confirmed correct orientationin the AP and lateral plane. Offset connectors were placed on the rods, between L 2 and L 3 on the right and L 3 and L4 on the left and contoured rods were affixed to the L 2 screws.   Final x-rays demonstrated well-positioned interbody graft and pedicle screw fixation. The posterolateral region was packed with 45 cc of bone auto and allograft bone graft from L 2 through 4 on the left and L 2 through 3 on the right.  Fascia was closed with 1 Vicryl sutures skin edges were reapproximated 2 and 3-0 Vicryl sutures. The wound is dressed with Dermabond  and an occlusive dressing.  the patient was extubated in the operating room and taken to recovery in stable satisfactory condition. Counts were correct at the end of the case.    PLAN OF CARE: Admit to inpatient   PATIENT DISPOSITION:  PACU -  hemodynamically stable.   Delay start of Pharmacological VTE agent (>24hrs) due to surgical blood loss or risk of bleeding: yes

## 2013-11-28 NOTE — Anesthesia Preprocedure Evaluation (Addendum)
Anesthesia Evaluation  Patient identified by MRN, date of birth, ID band Patient awake    Reviewed: Allergy & Precautions, H&P , NPO status , Patient's Chart, lab work & pertinent test results  History of Anesthesia Complications Negative for: history of anesthetic complications  Airway Mallampati: II  Neck ROM: Full    Dental  (+) Teeth Intact, Dental Advisory Given   Pulmonary neg pulmonary ROS, former smoker,  breath sounds clear to auscultation        Cardiovascular hypertension, Rhythm:Regular Rate:Normal     Neuro/Psych Anxiety Depression Back pain . Scoliosis    GI/Hepatic Neg liver ROS, GERD-  ,  Endo/Other  negative endocrine ROS  Renal/GU negative Renal ROS     Musculoskeletal  (+) Arthritis -,   Abdominal   Peds  Hematology   Anesthesia Other Findings   Reproductive/Obstetrics                          Anesthesia Physical Anesthesia Plan  ASA: II  Anesthesia Plan: General   Post-op Pain Management:    Induction: Intravenous  Airway Management Planned: Oral ETT  Additional Equipment: Arterial line  Intra-op Plan:   Post-operative Plan: Extubation in OR  Informed Consent: I have reviewed the patients History and Physical, chart, labs and discussed the procedure including the risks, benefits and alternatives for the proposed anesthesia with the patient or authorized representative who has indicated his/her understanding and acceptance.   Dental advisory given  Plan Discussed with: CRNA and Surgeon  Anesthesia Plan Comments:         Anesthesia Quick Evaluation

## 2013-11-28 NOTE — Progress Notes (Signed)
Awake, alert, conversant.  MAEW with good strength including improved hip flexor strength.  Doing well, but having some incisional pain.

## 2013-11-28 NOTE — H&P (Signed)
> [address] [place], [place] [address] Phone: [phone]   Patient ID:   [id number] Patient: Vanessa Harmon  Date of Birth: Feb 03, 1951 Visit Type: Office Visit   Date: 10/30/2013 10:00 AM Provider: Marchia Meiers. Vertell Limber MD   This 63 year old female presents for Follow Up of back pain.  History of Present Illness: 1.  Follow Up of back pain  Mrs. Raso visits to discuss her upcoming surgery. She has been fitted for TLSO at [facility].         MEDICATIONS(added, continued or stopped this visit):   Started Medication Directions Instruction Stopped   aspirin 81 mg tablet,delayed release take 1 tablet by oral route  every day     Astelin 137 mcg nasal spray aerosol spray 2 spray by intranasal route  every 12 hours     CALCIUM take 1 tablet by mouth twice daily     Fish Oil take 1 tablet by mouth twice daily     FLUTICASONE PROPIONATE inhale 2 spray by intranasal route 2 times every day in each nostril     indomethacin ER 75 mg capsule,extended release take 1 capsule by oral route  every day     Lexapro 20 mg tablet take 1 tablet by oral route  every day     lorazepam 1 mg tablet take 1 tablet by oral route  every day at bedtime     losartan 100 mg-hydrochlorothiazide 25 mg tablet take 1 tablet by oral route  every day     metoprolol succinate ER 25 mg tablet,extended release 24 hr take 1 tablet by oral route  every day     multivitamin tablet 1 tablet daily    10/09/2013 Norco 10 mg-325 mg tablet take 1 tablet by oral route  every 6 hours as needed for pain     omeprazole 20 mg capsule,delayed release take 1 capsule by oral route  every day 30 minutes to 1 hour before a meal     prednisone 5 mg tablet take 1.5 tablet by oral route  every day     Rasuvo (PF) 30 mg/0.6 mL subcutaneous auto-injector inject 0.6 milliliter by subcutaneous route  every week     Vitamin C ER 1,000 mg tablet,extended release take 1 tablet by mouth daily       ALLERGIES:  Ingredient Reaction Medication Name Comment  DOXYCYCLINE MONOHYDRATE  Vibramycin Rash  DOXYCYCLINE HYCLATE  Vibramycin Rash  DOXYCYCLINE CALCIUM  Vibramycin Rash    Vitals Date Temp F BP Pulse Ht In Wt Lb BMI BSA Pain Score  10/30/2013  152/84 47 67 171 26.78  3/10        IMPRESSION Mrs. Lauderbaugh is in good spirits, hopeful of pain relief following her upcoming surgery.   Completed Orders (this encounter) Order Details Reason Side Interpretation Result Initial Treatment Date Region  Lifestyle education regarding diet Patient encouraged to eat a well balanced diet.        Hypertension education Patient to follow up with primary care provider.         Assessment/Plan # Detail Type Description   1. Assessment BMI 26.0-26.9,ADULT (V85.22).   Plan Orders Today's instructions / counseling include(s) Lifestyle education regarding diet.       2. Assessment Hypertension, Unspecified (401.9).       3. Assessment Acquired spondylolisthesis (738.4).         Pain Assessment/Treatment Pain Scale: 3/10. Method: Numeric Pain Intensity Scale. Location: back. Onset: 01/30/2013. Duration: varies. Quality:  discomforting. Pain Assessment/Treatment follow-up plan of care: Patient taking medication as prescribed..  Fall Risk Plan The Patient has fallen 1 times in the last year.  Falls risk follow-up plan of care: Assisted devices: Advised to use safety measures when avab..  Planned procedure of XLIF L2-3  with a lateral plate and posterior decompression of neural elements is reviewed, as well as perioperative expectations.  Discharge instructions are also reviewed. She verballizes understanding of the plan as well as hopsital stay and discharge instructions. She will bring her TLSO to the hospital on the day of surgery (11-29-13).  Orders: Instruction(s)/Education: Assessment Instruction  401.9 Hypertension education  V85.22 Lifestyle education regarding diet              Provider:  Marchia Meiers. Vertell Limber MD  10/30/2013 01:11 PM Dictation edited by: Mike Craze. Poteat    CC Providers: [facility] [address] [place],  [place]  [address]-   [facility] [place], [place] [address]- ----------------------------------------------------------------------------------------------------------------------------------------------------------------------         Electronically signed by Marchia Meiers. Vertell Limber MD on 11/05/2013 04:31 PM  > [address] [place], [place] [address] Phone: [phone]   Patient ID:   [id number] Patient: Vanessa Harmon  Date of Birth: [birth year] Visit Type: Office Visit   Date: 10/09/2013 02:15 PM Provider: Marchia Meiers. Vertell Limber MD   This 63 year old female presents for Follow Up of back pain.  History of Present Illness: 1.  Follow Up of back pain  Hip injection gave the patient a brief respite from pain but then the pain came back.  Dr. Para March felt that her back with her biggest issue and that the hip was not a big problem.  I have reviewed her imaging studies and also calculates sagittal balance.  She has coronal deformity but is not out of sagittal balance.  Her biggest problem relates to the L2-3 disc herniation and spondylolisthesis with lateral scoliosis.  I reviewed her studies and think that she would benefit from X lift procedure at L23 with lateral plate along with posterior decompression with removal of disc herniation.  I do not believe that additional surgery needs to be done higher up in her spine and think that this should relieve her pain complaints.  Her pedicles were extremely small and upper lumbar pedicle screws would be problematic for her.  She wishes to proceed with surgery and we'll plan on doing so in early August.  She continues to have flexor weakness on the right.      Medical/Surgical/Interim History Reviewed, no change.  Last detailed document  date:07/31/2013.   PAST MEDICAL HISTORY, SURGICAL HISTORY, FAMILY HISTORY, SOCIAL HISTORY AND REVIEW OF SYSTEMS I have reviewed the patient's past medical, surgical, family and social history as well as the comprehensive review of systems as included on the [facility] history form dated 12/05/2012, which I have signed.  Family History: Reviewed, no changes.  Last detailed document: 07/31/2013.   Social History: Tobacco use reviewed. Reviewed, no changes. Last detailed document date: 07/31/2013.      MEDICATIONS(added, continued or stopped this visit):   Started Medication Directions Instruction Stopped   aspirin 81 mg tablet,delayed release take 1 tablet by oral route  every day     Astelin 137 mcg nasal spray aerosol spray 2 spray by intranasal route  every 12 hours     CALCIUM take 1 tablet by mouth twice daily  Fish Oil take 1 tablet by mouth twice daily     FLUTICASONE PROPIONATE inhale 2 spray by intranasal route 2 times every day in each nostril     indomethacin ER 75 mg capsule,extended release take 1 capsule by oral route  every day     Lexapro 20 mg tablet take 1 tablet by oral route  every day     lorazepam 1 mg tablet take 1 tablet by oral route  every day at bedtime     losartan 100 mg-hydrochlorothiazide 25 mg tablet take 1 tablet by oral route  every day     metoprolol succinate ER 25 mg tablet,extended release 24 hr take 1 tablet by oral route  every day     multivitamin tablet 1 tablet daily    10/09/2013 Norco 10 mg-325 mg tablet take 1 tablet by oral route  every 6 hours as needed for pain     omeprazole 20 mg capsule,delayed release take 1 capsule by oral route  every day 30 minutes to 1 hour before a meal     prednisone 5 mg tablet take 1.5 tablet by oral route  every day     Rasuvo (PF) 30 mg/0.6 mL subcutaneous auto-injector inject 0.6 milliliter by subcutaneous route  every week     Vitamin C ER 1,000 mg tablet,extended release  take 1 tablet by mouth daily      ALLERGIES:  Ingredient Reaction Medication Name Comment  DOXYCYCLINE MONOHYDRATE  Vibramycin Rash  DOXYCYCLINE HYCLATE  Vibramycin Rash  DOXYCYCLINE CALCIUM  Vibramycin Rash  Reviewed, no changes.   Vitals Date Temp F BP Pulse Ht In Wt Lb BMI BSA Pain Score  10/09/2013  135/71 44 67 173 27.1  7/10        IMPRESSION L2-3 spondylolisthesis with scoliosis and disc space collapse.  I have recommended proceeding with decompression and fusion at the L2-3 level as described above.  Completed Orders (this encounter) Order Details Reason Side Interpretation Result Initial Treatment Date Region  Lifestyle education regarding diet Encouraged to eat a well balanced diet and follow up with primary care physician.         Assessment/Plan # Detail Type Description   1. Assessment Acquired spondylolisthesis (738.4).       2. Assessment Thoracogenic scoliosis of thoracolumbar region (737.34).       3. Assessment Lumbar radiculopathy (724.4).       4. Assessment Lumbar scoliosis (737.30).       5. Assessment Lumbago (724.2).       6. Assessment BMI 27.0-27.9,ADULT (V85.23).   Plan Orders Today's instructions / counseling include(s) Lifestyle education regarding diet.         Pain Assessment/Treatment Pain Scale: 7/10. Method: Numeric Pain Intensity Scale. Location: back. Onset: 01/30/2013. Duration: varies. Quality: sharp. Pain Assessment/Treatment follow-up plan of care: Patient currently taking pain medication as prescribed..  L2-3 decompression and fusion.  Risks and benefits were discussed with patient and she will be fitted for also brace.  Orders: Instruction(s)/Education: Assessment Instruction  V85.23 Lifestyle education regarding diet    MEDICATIONS PRESCRIBED TODAY    Rx Quantity Refills  NORCO 10 mg-325 mg  90 0            Provider:  Marchia Meiers. Vertell Limber MD  10/10/2013 06:01 PM Dictation edited by: Marchia Meiers. Vertell Limber    CC  Providers: [facility] [address] [place],  [place]  [address]-   [facility] [place],  [place] [address]- ----------------------------------------------------------------------------------------------------------------------------------------------------------------------         Electronically signed by Marchia Meiers. Vertell Limber MD on 10/10/2013 06:01 PM   [address] [place], [place] [address] Phone: [phone]   Patient ID:   [id number] Patient: Vanessa Harmon  Date of Birth: Mar 11, 1951 Visit Type: Office Visit   Date: 09/16/2013 09:30 AM Provider: Marchia Meiers. Vertell Limber MD   This 63 year old female presents for Follow Up of back pain.  History of Present Illness: 1.  Follow Up of back pain  Mrs. Pola returns to review radiographs & MRI.  Images on [place]  I reviewed the patient's MRI and plain radiographs.  They show that she has significant herniation at L2-3 on the right and I believe this is a likely basis for her right leg pain.  At the same time plain radiographs demonstrate right greater than left hip arthritis.  Patient's examination reveals 4 out of 5 strength in her right hip flexor and a markedly positive Patrick's test on the right.  The patient is had relief with previous hip injection on the right.  Isolating to the patient and her husband that she appears to have both lumbar scoliosis and disc pathology as well as hip osteoarthritis.  I have recommended that the patient go back to see Dr. Para March for consideration of the severity of her right hip arthritis.  I do think that both are active problems and that both will need to be treated but that her hip pathology may be worse currently been her low back problem.  She does clearly have a right L2 radiculopathy and she has significant scoliosis of the lumbar spine.  I spent greater than 25 minutes in detailed discussion with the patient and her husband regarding her problems  and my recommendations.      Medical/Surgical/Interim History Reviewed, no change.  Last detailed document date:07/31/2013.   PAST MEDICAL HISTORY, SURGICAL HISTORY, FAMILY HISTORY, SOCIAL HISTORY AND REVIEW OF SYSTEMS I have reviewed the patient's past medical, surgical, family and social history as well as the comprehensive review of systems as included on the [facility] history form dated 12/05/2012, which I have signed.  Family History: Reviewed, no changes.  Last detailed document: 07/31/2013.   Social History: Tobacco use reviewed. Reviewed, no changes. Last detailed document date: 07/31/2013.      MEDICATIONS(added, continued or stopped this visit):   Started Medication Directions Instruction Stopped   aspirin 81 mg tablet,delayed release take 1 tablet by oral route  every day     Astelin 137 mcg nasal spray aerosol spray 2 spray by intranasal route  every 12 hours     CALCIUM take 1 tablet by mouth twice daily     Fish Oil take 1 tablet by mouth twice daily     FLUTICASONE PROPIONATE inhale 2 spray by intranasal route 2 times every day in each nostril     indomethacin ER 75 mg capsule,extended release take 1 capsule by oral route  every day     Lexapro 20 mg tablet take 1 tablet by oral route  every day     lorazepam 1 mg tablet take 1 tablet by oral route  every day at bedtime     losartan 100 mg-hydrochlorothiazide 25 mg tablet take 1 tablet by oral route  every day     metoprolol succinate ER 25 mg tablet,extended release 24 hr take 1 tablet by oral route  every day     multivitamin  tablet 1 tablet daily     omeprazole 20 mg capsule,delayed release take 1 capsule by oral route  every day 30 minutes to 1 hour before a meal     prednisone 5 mg tablet take 1.5 tablet by oral route  every day     Rasuvo (PF) 30 mg/0.6 mL subcutaneous auto-injector inject 0.6 milliliter by subcutaneous route  every week     Vitamin C ER 1,000 mg  tablet,extended release take 1 tablet by mouth daily      ALLERGIES:  Ingredient Reaction Medication Name Comment  DOXYCYCLINE MONOHYDRATE  Vibramycin Rash  DOXYCYCLINE HYCLATE  Vibramycin Rash  DOXYCYCLINE CALCIUM  Vibramycin Rash  Reviewed, no changes.   Vitals Date Temp F BP Pulse Ht In Wt Lb BMI BSA Pain Score  09/16/2013  144/74 43 67 172 26.94  7/10      DIAGNOSTIC RESULTS Diagnostic report text  CLINICAL DATA: Acquired spondylolisthesis. Pain for 1 year, with pain across the low back pain extending into the hips and bilateral thighs. Occasional right leg weakness. Prior lumbar spine surgery in 2012.  EXAM: MRI LUMBAR SPINE WITHOUT AND WITH CONTRAST  TECHNIQUE: Multiplanar and multiecho pulse sequences of the lumbar spine were obtained without and with intravenous contrast.  CONTRAST: 16 mL MultiHance  COMPARISON: 09/02/2010 lumbar spine MRI and 05/08/2013 lumbar spine radiographs  FINDINGS: Vertebral body numbering and folate on the prior MRI will be continued on this examination.  Since the prior MRI, the patient has undergone L3-S1 posterior and interbody fusion. There is approximately 8 mm retrolisthesis of L2 on L3, new from prior MRI but unchanged from more recent radiographs. There is advanced disc space narrowing at L2-3. Degenerative marrow changes are present at L2-3 with marrow edema, with fatty marrow changes present at L5-S1. Left-sided Tarlov cyst is noted at S2-3. Conus medullaris is normal in signal and terminates at L1.  Mild upper lumbar dextroscoliosis is noted. There is no evidence of compression fracture. 5 mm T2 hyperintense lesion in the right kidney is unchanged and most likely represents a cyst. Dorsal epidural fluid collection/pseudomeningocele at the laminectomy sites posterior to L4 and L5 measures 1.9 x 4.3 x 6.3 cm (AP by transverse by craniocaudal).  L1-2: Negative.  L2-3: Increased, moderate circumferential disc bulge  with new superimposed, large right central disc extrusion with inferior migration to the mid L3 vertebral body level. Combined with posterior ligamentous and facet hypertrophy, this results in severe spinal stenosis. Neural foramina appear patent. There is prominent epidural enhancement at this level, both ventrally around the extruded disc as well as dorsally, presumably related at least in part to prior surgery.  L3-4: Interval posterior decompression with widely patent spinal canal. No neural foraminal stenosis.  L4-5: Interval posterior decompression with widely patent spinal canal. No neural foraminal stenosis.  L5-S1: Interval posterior decompression with widely patent spinal canal. Endplate spurring results in mild right neural foraminal narrowing, improved from prior.  IMPRESSION: 1. Postoperative changes from L3-S1 fusion with decompressed spinal canal at these levels. 2. New, severe adjacent level degenerative change at L2-3, including a large disc extrusion, resulting in severe spinal stenosis.   Electronically Signed By: Logan Bores On: 08/20/2013 14:17    IMPRESSION Patient has an L2 radiculopathy on the right with significant scoliosis and severe disc herniation at L2-3.  In addition she has right hip osteoarthritis.  Completed Orders (this encounter) Order Details Reason Side Interpretation Result Initial Treatment Date Region  Lifestyle education regarding diet Encouraged to eat a  well balanced diet and follow up with primary care physician.        Hypertension education Follow up with primary care physician.         Assessment/Plan # Detail Type Description   1. Assessment Acquired spondylolisthesis (738.4).       2. Assessment Lumbar radiculopathy (724.4).       3. Assessment Lumbar scoliosis (737.30).       4. Assessment Hip osteoarthritis (715.95).       5. Assessment BMI 26.0-26.9,ADULT (V85.22).   Plan Orders Today's instructions / counseling  include(s) Lifestyle education regarding diet.       6. Assessment Hypertension, Unspecified (401.9).         Pain Assessment/Treatment Pain Scale: 7/10. Method: Numeric Pain Intensity Scale. Location: back. Onset: 01/30/2013. Duration: varies. Quality: burning. Pain Assessment/Treatment follow-up plan of care: Patient currently taking pain medication as prescribed..  I have asked the patient to have Dr. Noemi Chapel evaluate her right hip and she will follow up with me after that.  Orders: Instruction(s)/Education: Assessment Instruction  401.9 Hypertension education  V85.22 Lifestyle education regarding diet             Provider:  Marchia Meiers. Vertell Limber MD  09/22/2013 05:39 PM Dictation edited by: Marchia Meiers. Vertell Limber    CC Providers: [facility] [address] [place],  [place]  [address]-   [facility] [place], [place] [address]- ----------------------------------------------------------------------------------------------------------------------------------------------------------------------         Electronically signed by Marchia Meiers. Vertell Limber MD on 09/22/2013 05:39 PM

## 2013-11-28 NOTE — Telephone Encounter (Signed)
Noted and patient seen and referred on to GYN.

## 2013-11-28 NOTE — Transfer of Care (Signed)
Immediate Anesthesia Transfer of Care Note  Patient: Vanessa Harmon  Procedure(s) Performed: Procedure(s) with comments: Exploration of fusion with decompression and fusion of Lumbar 2-3 with pedicle screw fixation and posterolateral arthrodesis (N/A) - Exploration of fusion with decompression and fusion of Lumbar 2-3 with pedicle screw fixation and posterolateral arthrodesis  Patient Location: PACU  Anesthesia Type:General  Level of Consciousness: awake, alert  and oriented  Airway & Oxygen Therapy: Patient Spontanous Breathing  Post-op Assessment: Report given to PACU RN  Post vital signs: Reviewed and stable  Complications: No apparent anesthesia complications

## 2013-11-29 NOTE — Progress Notes (Signed)
Subjective: Patient reports "I'm still having some pain in my right leg, but its not terrible"  Objective: Vital signs in last 24 hours: Temp:  [97.6 F (36.4 C)-99 F (37.2 C)] 97.8 F (36.6 C) (08/14 0825) Pulse Rate:  [60-86] 69 (08/14 0825) Resp:  [8-24] 18 (08/14 0825) BP: (102-163)/(53-81) 104/67 mmHg (08/14 0825) SpO2:  [92 %-100 %] 92 % (08/14 0825)  Intake/Output from previous day: 08/13 0701 - 08/14 0700 In: 1800 [I.V.:1800] Out: 1290 [Urine:790; Blood:500] Intake/Output this shift:    Alert, conversant. Worked diligently with PT & OT, now resting. Incision with Dermabond & honeycomb drsg. No erythema, swelling or drainage. Good strength BLE.   Lab Results: No results found for this basename: WBC, HGB, HCT, PLT,  in the last 72 hours BMET No results found for this basename: NA, K, CL, CO2, GLUCOSE, BUN, CREATININE, CALCIUM,  in the last 72 hours  Studies/Results: Dg Lumbar Spine 2-3 Views  11/28/2013   CLINICAL DATA:  Lumbar fusion  EXAM: DG C-ARM GT 120 MIN; LUMBAR SPINE - 2-3 VIEW  TECHNIQUE: Four digital C-arm fluoroscopic images obtained intraoperatively are submitted for postoperative interpretation.  FLUOROSCOPY TIME:  0 min 38.3 second  COMPARISON:  Lumbar radiographs 09/16/2013, MRI lumbar spine 08/20/2013  FINDINGS: Five lumbar vertebrae labeled on prior MR.  Images demonstrate placement of BILATERAL pedicle screws and bars from L2-L5.  Disc prostheses present at L3-L4 and L4-L5.  Bones severely demineralized.  Disc space narrowing L2-L3.  L5 incompletely visualized.  IMPRESSION: Fluoroscopic images obtained during L2-L5 posterior fusion as above.   Electronically Signed   By: Lavonia Dana M.D.   On: 11/28/2013 16:44   Dg C-arm Gt 120 Min  11/28/2013   CLINICAL DATA:  Lumbar fusion  EXAM: DG C-ARM GT 120 MIN; LUMBAR SPINE - 2-3 VIEW  TECHNIQUE: Four digital C-arm fluoroscopic images obtained intraoperatively are submitted for postoperative interpretation.   FLUOROSCOPY TIME:  0 min 38.3 second  COMPARISON:  Lumbar radiographs 09/16/2013, MRI lumbar spine 08/20/2013  FINDINGS: Five lumbar vertebrae labeled on prior MR.  Images demonstrate placement of BILATERAL pedicle screws and bars from L2-L5.  Disc prostheses present at L3-L4 and L4-L5.  Bones severely demineralized.  Disc space narrowing L2-L3.  L5 incompletely visualized.  IMPRESSION: Fluoroscopic images obtained during L2-L5 posterior fusion as above.   Electronically Signed   By: Lavonia Dana M.D.   On: 11/28/2013 16:44    Assessment/Plan: Improving   LOS: 1 day  Continue to mobilize in TLSO. Plan for d/c home in AM. Pt verbalizes understanding of d/c instructions & agrees tocall office to schedule 3-4 week post-op appt with DrStern. Pt will hold Indomethacin for at least 1 month.    Vanessa Harmon 11/29/2013, 10:06 AM

## 2013-11-29 NOTE — Evaluation (Signed)
Physical Therapy Evaluation Patient Details Name: Vanessa Harmon MRN: [id number] DOB: 09-06-1950 Today's Date: 11/29/2013   History of Present Illness  Pt admitted for XLIF L2-3  Clinical Impression  Pt pleasant but painful after surgery. Pt reports 9/10 pain with transfers and 6/10 pain in RLE at rest from hip to knee with some incisional pain. Pt reports pain pre-op was varied depending on amount of activity but post-op has been more consistent. Pt educated for all precautions with transfers, gait, mobility, stairs and brace wear. Pt will benefit from acute therapy to maximize mobility, gait and adherence to precautions prior to D/C to decrease burden of care.     Follow Up Recommendations No PT follow up    Equipment Recommendations  None recommended by PT    Recommendations for Other Services       Precautions / Restrictions Precautions Precautions: Back Precaution Booklet Issued: Yes (comment) Required Braces or Orthoses: Spinal Brace Spinal Brace: Thoracolumbosacral orthotic;Applied in sitting position      Mobility  Bed Mobility Overal bed mobility: Needs Assistance Bed Mobility: Rolling;Sidelying to Sit Rolling: Supervision Sidelying to sit: Supervision       General bed mobility comments: cues for sequence and precautions  Transfers Overall transfer level: Needs assistance   Transfers: Sit to/from Stand Sit to Stand: Supervision         General transfer comment: cues for posture and safety  Ambulation/Gait Ambulation/Gait assistance: Modified independent (Device/Increase time) Ambulation Distance (Feet): 300 Feet Assistive device: Rolling walker (2 wheeled) Gait Pattern/deviations: Step-through pattern;Decreased stride length   Gait velocity interpretation: Below normal speed for age/gender    Stairs Stairs: Yes Stairs assistance: Supervision Stair Management: One rail Right;Step to pattern;Forwards Number of Stairs: 4 General stair comments:  cues for sequence with step to pattern  Wheelchair Mobility    Modified Rankin (Stroke Patients Only)       Balance                                             Pertinent Vitals/Pain Pain Assessment: 0-10 Pain Score: 6  Pain Descriptors / Indicators: Aching Pain Intervention(s): Limited activity within patient's tolerance;Monitored during session;Premedicated before session;Repositioned;Patient requesting pain meds-RN notified    Home Living Family/patient expects to be discharged to:: Private residence Living Arrangements: Spouse/significant other Available Help at Discharge: Family;Available 24 hours/day Type of Home: House Home Access: Stairs to enter Entrance Stairs-Rails: Right Entrance Stairs-Number of Steps: 3 Home Layout: Two level;Able to live on main level with bedroom/bathroom Home Equipment: Gilford Rile - 2 wheels;Shower seat;Grab bars - toilet;Transport chair      Prior Function Level of Independence: Independent         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        Extremity/Trunk Assessment   Upper Extremity Assessment: Defer to OT evaluation           Lower Extremity Assessment: Generalized weakness      Cervical / Trunk Assessment: Other exceptions  Communication   Communication: No difficulties  Cognition Arousal/Alertness: Awake/alert Behavior During Therapy: WFL for tasks assessed/performed Overall Cognitive Status: Within Functional Limits for tasks assessed                      General Comments      Exercises  Assessment/Plan    PT Assessment Patient needs continued PT services  PT Diagnosis Difficulty walking;Acute pain   PT Problem List Decreased activity tolerance;Pain;Decreased knowledge of precautions;Decreased mobility  PT Treatment Interventions Gait training;DME instruction;Functional mobility training;Therapeutic activities;Patient/family education   PT Goals (Current  goals can be found in the Care Plan section) Acute Rehab PT Goals Patient Stated Goal: get back to training my dogs PT Goal Formulation: With patient Time For Goal Achievement: 12/06/13 Potential to Achieve Goals: Good    Frequency Min 5X/week   Barriers to discharge        Co-evaluation               End of Session Equipment Utilized During Treatment: Back brace Activity Tolerance: Patient limited by pain Patient left: in chair;with call bell/phone within reach Nurse Communication: Mobility status;Precautions         Time: 3128-1188 PT Time Calculation (min): 23 min   Charges:   PT Evaluation $Initial PT Evaluation Tier I: 1 Procedure PT Treatments $Therapeutic Activity: 8-22 mins   PT G Codes:          Melford Aase 11/29/2013, 8:29 AM Elwyn Reach, [place]

## 2013-11-29 NOTE — Evaluation (Signed)
Occupational Therapy Evaluation Patient Details Name: Vanessa Harmon MRN: [id number] DOB: 1951/04/04 Today's Date: 11/29/2013    History of Present Illness Pt admitted for XLIF L2-3   Clinical Impression   Patient evaluated by Occupational Therapy with no further acute OT needs identified. All education has been completed and the patient has no further questions. See below for any follow-up Occupational Therapy or equipment needs. OT to sign off. Thank you for referral.      Follow Up Recommendations  No OT follow up    Equipment Recommendations  None recommended by OT    Recommendations for Other Services       Precautions / Restrictions Precautions Precautions: Back Precaution Booklet Issued: Yes (comment) Required Braces or Orthoses: Spinal Brace Spinal Brace: Thoracolumbosacral orthotic;Applied in sitting position      Mobility Bed Mobility Overal bed mobility: Needs Assistance Bed Mobility: Rolling;Sidelying to Sit Rolling: Supervision Sidelying to sit: Supervision       General bed mobility comments: in chair  Transfers Overall transfer level: Needs assistance Equipment used: Rolling walker (2 wheeled) Transfers: Sit to/from Stand Sit to Stand: Supervision         General transfer comment: cues for posture and safety    Balance                                            ADL Overall ADL's : Needs assistance/impaired Eating/Feeding: Independent   Grooming: Oral care;Modified independent Grooming Details (indicate cue type and reason): pt using cups and following back precautions     Lower Body Bathing: Maximal assistance;Sit to/from stand (unable to cross bil LE)           Toilet Transfer: Supervision/safety             General ADL Comments: Pt educated on back precautions with adls and handout reviewed. pt  completes oral care. pt has reacher at home to complete LB dressign at supervision level     Vision                      Perception     Praxis      Pertinent Vitals/Pain Pain Assessment: 0-10 Pain Score: 6  Pain Descriptors / Indicators: Aching Pain Intervention(s): Premedicated before session (Rn giving medication 3 minutes prior)     Hand Dominance Right   Extremity/Trunk Assessment Upper Extremity Assessment Upper Extremity Assessment: Overall WFL for tasks assessed   Lower Extremity Assessment Lower Extremity Assessment: Defer to PT evaluation   Cervical / Trunk Assessment Cervical / Trunk Assessment: Other exceptions Cervical / Trunk Exceptions: scoliosis   Communication Communication Communication: No difficulties   Cognition Arousal/Alertness: Awake/alert Behavior During Therapy: WFL for tasks assessed/performed Overall Cognitive Status: Within Functional Limits for tasks assessed                     General Comments       Exercises       Shoulder Instructions      Home Living Family/patient expects to be discharged to:: Private residence Living Arrangements: Spouse/significant other Available Help at Discharge: Family;Available 24 hours/day Type of Home: House Home Access: Stairs to enter [employer] of Steps: 3 Entrance Stairs-Rails: Right Home Layout: Two level;Able to live on main level with bedroom/bathroom     Bathroom Shower/Tub: Occupational psychologist: Standard  Home Equipment: [place] - 2 wheels;Shower seat;Grab bars - toilet;Transport chair          Prior Functioning/Environment Level of Independence: Independent        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, engineering    OT Diagnosis:     OT Problem List:     OT Treatment/Interventions:      OT Goals(Current goals can be found in the care plan section) Acute Rehab OT Goals Patient Stated Goal: get back to training my dogs  OT Frequency:     Barriers to D/C:            Co-evaluation              End of Session Equipment Utilized During  Treatment: Back brace Nurse Communication: Mobility status;Precautions  Activity Tolerance: Patient tolerated treatment well Patient left: in chair;with call bell/phone within reach   Time: 0825-0839 OT Time Calculation (min): 14 min Charges:  OT General Charges $OT Visit: 1 Procedure OT Evaluation $Initial OT Evaluation Tier I: 1 Procedure OT Treatments $Self Care/Home Management : 8-22 mins G-Codes:    Parke Poisson B 12-07-13, 8:49 AM Pager: [phone]

## 2013-11-29 NOTE — Progress Notes (Signed)
Utilization Review Completed.Winston Sobczyk T8/14/2015  

## 2013-11-30 MED ORDER — OXYCODONE-ACETAMINOPHEN 5-325 MG PO TABS: 1.0000 | ORAL_TABLET | ORAL | Status: DC | PRN

## 2013-11-30 MED ORDER — METHOCARBAMOL 500 MG PO TABS: 500.0000 mg | ORAL_TABLET | Freq: Four times a day (QID) | ORAL | Status: DC | PRN

## 2013-11-30 NOTE — Progress Notes (Signed)
Subjective: Patient reports doing well  Objective: Vital signs in last 24 hours: Temp:  [98.1 F (36.7 C)-99.7 F (37.6 C)] 99.4 F (37.4 C) (08/15 0820) Pulse Rate:  [62-76] 67 (08/15 0820) Resp:  [18] 18 (08/15 0820) BP: (107-148)/(62-74) 148/74 mmHg (08/15 0820) SpO2:  [90 %-96 %] 93 % (08/15 0820)  Intake/Output from previous day: 08/14 0701 - 08/15 0700 In: 360 [P.O.:360] Out: -  Intake/Output this shift:    Physical Exam: Doing well.  Dressing CDI.  Legs feeling better  Lab Results: No results found for this basename: WBC, HGB, HCT, PLT,  in the last 72 hours BMET No results found for this basename: NA, K, CL, CO2, GLUCOSE, BUN, CREATININE, CALCIUM,  in the last 72 hours  Studies/Results: Dg Lumbar Spine 2-3 Views  11/28/2013   CLINICAL DATA:  Lumbar fusion  EXAM: DG C-ARM GT 120 MIN; LUMBAR SPINE - 2-3 VIEW  TECHNIQUE: Four digital C-arm fluoroscopic images obtained intraoperatively are submitted for postoperative interpretation.  FLUOROSCOPY TIME:  0 min 38.3 second  COMPARISON:  Lumbar radiographs 09/16/2013, MRI lumbar spine 08/20/2013  FINDINGS: Five lumbar vertebrae labeled on prior MR.  Images demonstrate placement of BILATERAL pedicle screws and bars from L2-L5.  Disc prostheses present at L3-L4 and L4-L5.  Bones severely demineralized.  Disc space narrowing L2-L3.  L5 incompletely visualized.  IMPRESSION: Fluoroscopic images obtained during L2-L5 posterior fusion as above.   Electronically Signed   By: Lavonia Dana M.D.   On: 11/28/2013 16:44   Dg C-arm Gt 120 Min  11/28/2013   CLINICAL DATA:  Lumbar fusion  EXAM: DG C-ARM GT 120 MIN; LUMBAR SPINE - 2-3 VIEW  TECHNIQUE: Four digital C-arm fluoroscopic images obtained intraoperatively are submitted for postoperative interpretation.  FLUOROSCOPY TIME:  0 min 38.3 second  COMPARISON:  Lumbar radiographs 09/16/2013, MRI lumbar spine 08/20/2013  FINDINGS: Five lumbar vertebrae labeled on prior MR.  Images demonstrate  placement of BILATERAL pedicle screws and bars from L2-L5.  Disc prostheses present at L3-L4 and L4-L5.  Bones severely demineralized.  Disc space narrowing L2-L3.  L5 incompletely visualized.  IMPRESSION: Fluoroscopic images obtained during L2-L5 posterior fusion as above.   Electronically Signed   By: Lavonia Dana M.D.   On: 11/28/2013 16:44    Assessment/Plan: Doing well.  Discharge home.    LOS: 2 days    Peggyann Shoals, MD 11/30/2013, 8:56 AM

## 2013-11-30 NOTE — Discharge Instructions (Signed)

## 2013-11-30 NOTE — Progress Notes (Signed)
Patient alert and oriented, mae's well, voiding adequate amount of urine, swallowing without difficulty, no c/o pain. Patient discharged home with family. Script and discharged instructions given to patient. Patient and family stated understanding of instructions given.  

## 2013-11-30 NOTE — Discharge Summary (Signed)
Physician Discharge Summary  Patient ID: Vanessa Harmon MRN: [id number] DOB/AGE: 1950/07/09 63 y.o.  Admit date: 11/28/2013 Discharge date: 11/30/2013  Admission Diagnoses:Scoliosis, spondylolisthesis, herniated lumbar disc, stenosis, radiculopathy   Discharge Diagnoses: Scoliosis, spondylolisthesis, herniated lumbar disc, stenosis, radiculopathy  Active Problems:   Lumbar spine scoliosis   Discharged Condition: good  Hospital Course: Patient underwent redo decompression with exploration of fusion and pedicle screw fixation with posterolateral arthrodesis L 23 level.  She did well, mobilized well with improved leg pain.  Consults: None  Significant Diagnostic Studies: None  Treatments: surgery: redo decompression with exploration of fusion and pedicle screw fixation with posterolateral arthrodesis L 23 level  Discharge Exam: Blood pressure 148/74, pulse 67, temperature 99.4 F (37.4 C), temperature source Oral, resp. rate 18, height 5' 6.5" (1.689 m), weight 78.019 kg (172 lb), SpO2 93.00%. Neurologic: Alert and oriented X 3, normal strength and tone. Normal symmetric reflexes. Normal coordination and gait Wound:CDI  Disposition: Home     Medication List    STOP taking these medications       indomethacin 75 MG CR capsule  Commonly known as:  INDOCIN SR      TAKE these medications       aspirin 81 MG EC tablet  Take 81 mg by mouth daily.     atorvastatin 40 MG tablet  Commonly known as:  LIPITOR  Take 40 mg by mouth daily.     azelastine 0.1 % nasal spray  Commonly known as:  ASTELIN  Place 1 spray into both nostrils daily. Use in each nostril as directed     CALCIUM + D PO  Take by mouth 2 (two) times daily.     escitalopram 20 MG tablet  Commonly known as:  LEXAPRO  TAKE 1 TABLET BY MOUTH DAILY     fish oil-omega-3 fatty acids 1000 MG capsule  Take 1 g by mouth 2 (two) times daily.     fluticasone 50 MCG/ACT nasal spray  Commonly known as:  FLONASE   Place 1 spray into both nostrils daily.     folic acid 1 MG tablet  Commonly known as:  FOLVITE  Take 1 mg by mouth daily.     HYDROcodone-acetaminophen 10-325 MG per tablet  Commonly known as:  NORCO  Take 1 tablet by mouth every 6 (six) hours as needed for moderate pain.     LORazepam 1 MG tablet  Commonly known as:  ATIVAN  Take 1 tablet (1 mg total) by mouth 2 (two) times daily as needed for anxiety.     losartan-hydrochlorothiazide 100-12.5 MG per tablet  Commonly known as:  HYZAAR  Take 1 tablet by mouth daily.     Melatonin 10 MG Caps  Take 10 mg by mouth at bedtime.     methocarbamol 500 MG tablet  Commonly known as:  ROBAXIN  Take 1 tablet (500 mg total) by mouth every 6 (six) hours as needed for muscle spasms.     methotrexate 25 MG/ML injection  Inject 15 mg into the skin once a week. 15 mg=0.6 ml  q tuesday     oxyCODONE-acetaminophen 5-325 MG per tablet  Commonly known as:  PERCOCET/ROXICET  Take 1-2 tablets by mouth every 4 (four) hours as needed for moderate pain or severe pain.     polyethylene glycol packet  Commonly known as:  MIRALAX / GLYCOLAX  Take 17 g by mouth daily as needed.     vitamin C 1000 MG tablet  Take 1,000  mg by mouth daily.         Signed: Peggyann Shoals, MD 11/30/2013, 8:58 AM

## 2013-12-05 ENCOUNTER — Other Ambulatory Visit: Payer: 59 | Admitting: Internal Medicine

## 2013-12-06 ENCOUNTER — Encounter: Payer: 59 | Admitting: Internal Medicine

## 2014-01-13 ENCOUNTER — Other Ambulatory Visit: Payer: 59 | Admitting: Internal Medicine

## 2014-01-13 DIAGNOSIS — Z130 Encounter for screening for diseases of the blood and blood-forming organs and certain disorders involving the immune mechanism: Secondary | ICD-10-CM

## 2014-01-13 DIAGNOSIS — Z13228 Encounter for screening for other metabolic disorders: Secondary | ICD-10-CM

## 2014-01-13 DIAGNOSIS — I10 Essential (primary) hypertension: Secondary | ICD-10-CM

## 2014-01-13 DIAGNOSIS — Z0000 Encounter for general adult medical examination without abnormal findings: Secondary | ICD-10-CM

## 2014-01-13 DIAGNOSIS — R7309 Other abnormal glucose: Secondary | ICD-10-CM

## 2014-01-13 DIAGNOSIS — Z1329 Encounter for screening for other suspected endocrine disorder: Secondary | ICD-10-CM

## 2014-01-13 DIAGNOSIS — E785 Hyperlipidemia, unspecified: Secondary | ICD-10-CM

## 2014-01-14 ENCOUNTER — Encounter: Payer: Self-pay | Admitting: Internal Medicine

## 2014-01-14 ENCOUNTER — Ambulatory Visit (INDEPENDENT_AMBULATORY_CARE_PROVIDER_SITE_OTHER): Payer: 59 | Admitting: Internal Medicine

## 2014-01-14 DIAGNOSIS — I10 Essential (primary) hypertension: Secondary | ICD-10-CM

## 2014-01-14 DIAGNOSIS — Z8739 Personal history of other diseases of the musculoskeletal system and connective tissue: Secondary | ICD-10-CM

## 2014-01-14 DIAGNOSIS — Z0000 Encounter for general adult medical examination without abnormal findings: Secondary | ICD-10-CM

## 2014-01-14 DIAGNOSIS — E785 Hyperlipidemia, unspecified: Secondary | ICD-10-CM

## 2014-01-14 DIAGNOSIS — M7062 Trochanteric bursitis, left hip: Secondary | ICD-10-CM

## 2014-01-14 DIAGNOSIS — M7061 Trochanteric bursitis, right hip: Secondary | ICD-10-CM

## 2014-01-14 DIAGNOSIS — Z23 Encounter for immunization: Secondary | ICD-10-CM

## 2014-01-14 DIAGNOSIS — Z86010 Personal history of colonic polyps: Secondary | ICD-10-CM

## 2014-01-14 DIAGNOSIS — F3289 Other specified depressive episodes: Secondary | ICD-10-CM

## 2014-01-14 DIAGNOSIS — M76899 Other specified enthesopathies of unspecified lower limb, excluding foot: Secondary | ICD-10-CM

## 2014-01-14 DIAGNOSIS — F329 Major depressive disorder, single episode, unspecified: Secondary | ICD-10-CM

## 2014-01-14 DIAGNOSIS — Z860101 Personal history of adenomatous and serrated colon polyps: Secondary | ICD-10-CM

## 2014-01-14 DIAGNOSIS — F32A Depression, unspecified: Secondary | ICD-10-CM

## 2014-01-14 LAB — COMPREHENSIVE METABOLIC PANEL
ALT: 26 U/L (ref 0–35)
AST: 24 U/L (ref 0–37)
Albumin: 4.4 g/dL (ref 3.5–5.2)
Alkaline Phosphatase: 76 U/L (ref 39–117)
BUN: 16 mg/dL (ref 6–23)
CO2: 29 mEq/L (ref 19–32)
Calcium: 10.1 mg/dL (ref 8.4–10.5)
Chloride: 101 mEq/L (ref 96–112)
Creat: 0.83 mg/dL (ref 0.50–1.10)
Glucose, Bld: 79 mg/dL (ref 70–99)
Potassium: 4.3 mEq/L (ref 3.5–5.3)
Sodium: 141 mEq/L (ref 135–145)
Total Bilirubin: 0.7 mg/dL (ref 0.2–1.2)
Total Protein: 6.5 g/dL (ref 6.0–8.3)

## 2014-01-14 LAB — POCT URINALYSIS DIPSTICK
Bilirubin, UA: NEGATIVE
Blood, UA: NEGATIVE
Glucose, UA: NEGATIVE
Ketones, UA: NEGATIVE
Leukocytes, UA: NEGATIVE
Nitrite, UA: NEGATIVE
Protein, UA: NEGATIVE
Spec Grav, UA: 1.015
Urobilinogen, UA: NEGATIVE
pH, UA: 6

## 2014-01-14 LAB — CBC WITH DIFFERENTIAL/PLATELET
Basophils Absolute: 0.1 10*3/uL (ref 0.0–0.1)
Basophils Relative: 1 % (ref 0–1)
Eosinophils Absolute: 0.2 10*3/uL (ref 0.0–0.7)
Eosinophils Relative: 4 % (ref 0–5)
HCT: 39.2 % (ref 36.0–46.0)
Hemoglobin: 13.3 g/dL (ref 12.0–15.0)
Lymphocytes Relative: 31 % (ref 12–46)
Lymphs Abs: 1.6 10*3/uL (ref 0.7–4.0)
MCH: 30.7 pg (ref 26.0–34.0)
MCHC: 33.9 g/dL (ref 30.0–36.0)
MCV: 90.5 fL (ref 78.0–100.0)
Monocytes Absolute: 0.3 10*3/uL (ref 0.1–1.0)
Monocytes Relative: 5 % (ref 3–12)
Neutro Abs: 3.1 10*3/uL (ref 1.7–7.7)
Neutrophils Relative %: 59 % (ref 43–77)
Platelets: 294 10*3/uL (ref 150–400)
RBC: 4.33 MIL/uL (ref 3.87–5.11)
RDW: 13.7 % (ref 11.5–15.5)
WBC: 5.2 10*3/uL (ref 4.0–10.5)

## 2014-01-14 LAB — LIPID PANEL
Cholesterol: 190 mg/dL (ref 0–200)
HDL: 43 mg/dL (ref >39)
LDL Cholesterol: 112 mg/dL — ABNORMAL HIGH (ref 0–99)
Total CHOL/HDL Ratio: 4.4 Ratio
Triglycerides: 173 mg/dL — ABNORMAL HIGH (ref <150)
VLDL: 35 mg/dL (ref 0–40)

## 2014-01-14 LAB — HEMOGLOBIN A1C
Hgb A1c MFr Bld: 5.3 % (ref <5.7)
Mean Plasma Glucose: 105 mg/dL (ref <117)

## 2014-01-14 LAB — TSH: TSH: 3.494 u[IU]/mL (ref 0.350–4.500)

## 2014-01-14 LAB — VITAMIN D 25 HYDROXY (VIT D DEFICIENCY, FRACTURES): Vit D, 25-Hydroxy: 76 ng/mL (ref 30–89)

## 2014-01-14 NOTE — Patient Instructions (Signed)
Ask orthopedist about trochanteric bursitis. Consider restarting Indocin if neurosurgeon cruise. Return in 6 months. Continue same medications. Flu vaccine given.
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
## 2014-01-27 ENCOUNTER — Other Ambulatory Visit: Payer: Self-pay | Admitting: Internal Medicine

## 2014-02-18 ENCOUNTER — Encounter: Payer: Self-pay | Admitting: Internal Medicine

## 2014-02-20 ENCOUNTER — Other Ambulatory Visit: Payer: Self-pay | Admitting: Internal Medicine

## 2014-04-01 NOTE — Pre-Procedure Instructions (Signed)
MARILY KONCZAL  04/01/2014   Your procedure is scheduled on:  Dec. 29  Report to [facility] Main Entrance "A" at 8:10 AM.  Call this number if you have problems the morning of surgery: [phone]               If any questions prior to surgery call pre-admission [phone]. (8:00 am-4:00pm)   Remember:   Do not eat food or drink liquids after midnight.   Take these medicines the morning of surgery with A SIP OF WATER: flonase nasal spray, hydrocodone if needed, ativan if needed,lexapro             Discontinue aspirin 04/08/14.  Also discontinue nonsteroidal anti-inflammatory drugs: advil ibuprofen,aleve and herbal medications, fish oil and vitamins.   Do not wear jewelry, make-up or nail polish.  Do not wear lotions, powders, or perfumes. You may wear deodorant.  Do not shave 48 hours prior to surgery. Men may shave face and neck.  Do not bring valuables to the hospital.  [facility] is not responsible                  for any belongings or valuables.               Contacts, dentures or bridgework may not be worn into surgery.  Leave suitcase in the car. After surgery it may be brought to your room.  For patients admitted to the hospital, discharge time is determined by your                treatment team.               Patients discharged the day of surgery will not be allowed to drive  home.  Name and phone number of your driver:   Special Instructions: review preparing for surgery handout   Please read over the following fact sheets that you were given: Pain Booklet, Coughing and Deep Breathing, MRSA Information and Surgical Site Infection Prevention

## 2014-04-02 ENCOUNTER — Encounter (HOSPITAL_COMMUNITY): Payer: Self-pay

## 2014-04-02 ENCOUNTER — Encounter (HOSPITAL_COMMUNITY)
Admission: RE | Admit: 2014-04-02 | Discharge: 2014-04-02 | Disposition: A | Discharge status: Home / Self Care | Payer: 59 | Source: Ambulatory Visit | Attending: Orthopedic Surgery | Admitting: Orthopedic Surgery

## 2014-04-02 DIAGNOSIS — Z01812 Encounter for preprocedural laboratory examination: Secondary | ICD-10-CM | POA: Diagnosis present

## 2014-04-02 LAB — URINALYSIS, ROUTINE W REFLEX MICROSCOPIC
Bilirubin Urine: NEGATIVE
Glucose, UA: NEGATIVE mg/dL
Hgb urine dipstick: NEGATIVE
Ketones, ur: NEGATIVE mg/dL
Leukocytes, UA: NEGATIVE
Nitrite: NEGATIVE
Protein, ur: NEGATIVE mg/dL
Specific Gravity, Urine: 1.013 (ref 1.005–1.030)
Urobilinogen, UA: 0.2 mg/dL (ref 0.0–1.0)
pH: 5.5 (ref 5.0–8.0)

## 2014-04-02 LAB — CBC
HCT: 38.5 % (ref 36.0–46.0)
Hemoglobin: 12.6 g/dL (ref 12.0–15.0)
MCH: 30.7 pg (ref 26.0–34.0)
MCHC: 32.7 g/dL (ref 30.0–36.0)
MCV: 93.7 fL (ref 78.0–100.0)
Platelets: 293 10*3/uL (ref 150–400)
RBC: 4.11 MIL/uL (ref 3.87–5.11)
RDW: 15.5 % (ref 11.5–15.5)
WBC: 7.1 10*3/uL (ref 4.0–10.5)

## 2014-04-02 LAB — BASIC METABOLIC PANEL
Anion gap: 11 (ref 5–15)
BUN: 22 mg/dL (ref 6–23)
CO2: 27 mEq/L (ref 19–32)
Calcium: 9.7 mg/dL (ref 8.4–10.5)
Chloride: 99 mEq/L (ref 96–112)
Creatinine, Ser: 0.69 mg/dL (ref 0.50–1.10)
GFR calc Af Amer: >90 mL/min (ref >90)
GFR calc non Af Amer: >90 mL/min (ref >90)
Glucose, Bld: 113 mg/dL — ABNORMAL HIGH (ref 70–99)
Potassium: 4.5 mEq/L (ref 3.7–5.3)
Sodium: 137 mEq/L (ref 137–147)

## 2014-04-02 LAB — TYPE AND SCREEN
ABO/RH(D): O POS
Antibody Screen: NEGATIVE

## 2014-04-02 LAB — SURGICAL PCR SCREEN
MRSA, PCR: NEGATIVE
Staphylococcus aureus: NEGATIVE

## 2014-04-02 LAB — PROTIME-INR
INR: 0.96 (ref 0.00–1.49)
Prothrombin Time: 12.9 seconds (ref 11.6–15.2)

## 2014-04-02 LAB — APTT: aPTT: 32 seconds (ref 24–37)

## 2014-04-02 NOTE — Progress Notes (Addendum)
No orders in epic,notified kelly,scheduler at Dr. Debroah Loop office.  Also requested clearance note if they have one from Dr.Baxly.  Rulon Abide informed of previous history of svt and bradycardia on last pre-op visit in aug. 2015. Pt. Reports no new problems and is off beta blocker since August.  After pt. Left pre-admission appointment, orders were put into epic. An order for UA and culture. I called the ofice to see if pt. Needed to come back prior to surgery for this specimen or wait til day of surgery. Claiborne Billings, scheduler from Dr. Jamison Oka office stated pt. Needed to come back prior to surgery.

## 2014-04-02 NOTE — H&P (Signed)
[facility] [address] [place], [place] [address] [phone] A Division of [facility]  Ninetta Lights, M.D.   Robert A. Noemi Chapel, M.D.   Faythe Casa, M.D.   Johnny Bridge, M.D.   Almedia Balls, M.D Ernesta Amble. Percell Miller, M.D.  Joseph Pierini, M.D.  Lanier Prude, M.D.    Verner Chol, M.D. Mary L. Fenton Malling, PA-C  Kirstin A. Shepperson, PA-C  Josh New Cumby, PA-C [place], [place]                                                                    RE: Oni, Dietzman                                    [phone]      DOB: 21-Mar-1951 PROGRESS NOTE: 03-19-14 History: She is here for examination of her bilateral hips.  She has bilateral hip osteoarthritis with right being more symptomatic than left.  She was seen and treated by Dr. Noemi Chapel who has referred her to Dr. Percell Miller for surgical consultation for total hip arthroplasty.  She has had injections and tried other conservative measures that have failed to give her any relief.  Her last injection maybe lasted a couple of days, but after that her symptoms were back at full strength.  She has a history of hypertension and a family history of hypertension.  She is a nonsmoker and is retired.   Please see associated documentation for this clinic visit for further past medical, family, surgical and social history, review of systems, and exam findings as this was reviewed by me.  EXAMINATION: On exam she has sensation intact through bilateral lower extremities.  She has pain with internal and external rotation of bilateral hips.  She has hip flexion to 90 degrees and hip abduction to approximately 30 degrees on the right, on the left her abduction is 45 degrees.  She has approximately 10 degrees of internal rotation on the right and 25 degrees of internal rotation on the left.  It is mainly self limiting by pain.    ASSESSMENT: Bilateral hip end stage degenerative  arthritis, right greater than left.    PLAN: She has tried and failed many conservative measures to try to treat her hip symptoms.  She has had injections in her right hip which have previously offered relief, but most recently have not offered any relief.  She is now ready to move forward with surgical intervention of at least her right hip.  We are going to plan for anterior approach total hip arthroplasty on the right side.  We will look into trying to get an intraarticular hip injection done on the left at the time of surgery.  If we are unable to arrange intraarticular hip injection for the left at the time of surgery we will schedule it for outpatient setting and can be done in our office by Dr. Ron Agee.  Risks and benefits of anterior approach total hip arthroplasty were gone over at length by Dr. Percell Miller.  Patient understood and wishes to proceed.  We will plan to see her back in the office  postoperatively.    Ernesta Amble.  Percell Miller, M.D.  Electronically verified by Ernesta Amble. Percell Miller, M.D. TDM(BP):jjh Cc: Elsie Saas, M.D. D 03-20-14 T 03-21-14

## 2014-04-03 LAB — URINE CULTURE
Colony Count: NO GROWTH
Culture: NO GROWTH

## 2014-04-14 MED ORDER — CEFAZOLIN SODIUM-DEXTROSE 2-3 GM-% IV SOLR
2.0000 g | INTRAVENOUS | Status: AC
  Administered 2014-04-15: 2 g via INTRAVENOUS
  Filled 2014-04-14: qty 50

## 2014-04-14 MED ORDER — TRANEXAMIC ACID 100 MG/ML IV SOLN
1000.0000 mg | INTRAVENOUS | Status: AC
  Administered 2014-04-15: 1000 mg via INTRAVENOUS
  Filled 2014-04-14 (×2): qty 10

## 2014-04-15 ENCOUNTER — Inpatient Hospital Stay (HOSPITAL_COMMUNITY): Payer: 59 | Admitting: Vascular Surgery

## 2014-04-15 ENCOUNTER — Inpatient Hospital Stay (HOSPITAL_COMMUNITY)
Admission: RE | Admit: 2014-04-15 | Discharge: 2014-04-16 | DRG: 470 | Disposition: A | Discharge status: Home / Self Care | Payer: 59 | Source: Ambulatory Visit | Attending: Orthopedic Surgery | Admitting: Orthopedic Surgery

## 2014-04-15 ENCOUNTER — Encounter (HOSPITAL_COMMUNITY)
Admission: RE | Disposition: A | Discharge status: Home / Self Care | Payer: Self-pay | Source: Ambulatory Visit | Attending: Orthopedic Surgery

## 2014-04-15 ENCOUNTER — Encounter (HOSPITAL_COMMUNITY): Payer: Self-pay | Admitting: *Deleted

## 2014-04-15 ENCOUNTER — Inpatient Hospital Stay (HOSPITAL_COMMUNITY): Payer: 59 | Admitting: Anesthesiology

## 2014-04-15 ENCOUNTER — Inpatient Hospital Stay (HOSPITAL_COMMUNITY): Payer: 59

## 2014-04-15 DIAGNOSIS — M549 Dorsalgia, unspecified: Secondary | ICD-10-CM | POA: Diagnosis present

## 2014-04-15 DIAGNOSIS — E785 Hyperlipidemia, unspecified: Secondary | ICD-10-CM | POA: Diagnosis present

## 2014-04-15 DIAGNOSIS — G4700 Insomnia, unspecified: Secondary | ICD-10-CM | POA: Diagnosis present

## 2014-04-15 DIAGNOSIS — G8929 Other chronic pain: Secondary | ICD-10-CM | POA: Diagnosis present

## 2014-04-15 DIAGNOSIS — I471 Supraventricular tachycardia: Secondary | ICD-10-CM | POA: Diagnosis present

## 2014-04-15 DIAGNOSIS — Z86010 Personal history of colonic polyps: Secondary | ICD-10-CM | POA: Diagnosis not present

## 2014-04-15 DIAGNOSIS — K5900 Constipation, unspecified: Secondary | ICD-10-CM | POA: Diagnosis present

## 2014-04-15 DIAGNOSIS — I10 Essential (primary) hypertension: Secondary | ICD-10-CM | POA: Diagnosis present

## 2014-04-15 DIAGNOSIS — M1611 Unilateral primary osteoarthritis, right hip: Principal | ICD-10-CM | POA: Diagnosis present

## 2014-04-15 DIAGNOSIS — M1990 Unspecified osteoarthritis, unspecified site: Secondary | ICD-10-CM | POA: Diagnosis present

## 2014-04-15 DIAGNOSIS — Z9889 Other specified postprocedural states: Secondary | ICD-10-CM

## 2014-04-15 DIAGNOSIS — F419 Anxiety disorder, unspecified: Secondary | ICD-10-CM | POA: Diagnosis present

## 2014-04-15 DIAGNOSIS — J309 Allergic rhinitis, unspecified: Secondary | ICD-10-CM | POA: Diagnosis present

## 2014-04-15 DIAGNOSIS — F329 Major depressive disorder, single episode, unspecified: Secondary | ICD-10-CM | POA: Diagnosis present

## 2014-04-15 HISTORY — PX: TOTAL HIP ARTHROPLASTY: SHX124

## 2014-04-15 SURGERY — ARTHROPLASTY, HIP, TOTAL, ANTERIOR APPROACH
Anesthesia: General | Site: Hip | Laterality: Right

## 2014-04-15 MED ORDER — PROPOFOL 10 MG/ML IV BOLUS
INTRAVENOUS | Status: AC
  Filled 2014-04-15: qty 20

## 2014-04-15 MED ORDER — HYDROMORPHONE HCL 1 MG/ML IJ SOLN
INTRAMUSCULAR | Status: AC
  Administered 2014-04-15: 0.5 mg via INTRAVENOUS
  Filled 2014-04-15: qty 1

## 2014-04-15 MED ORDER — FENTANYL CITRATE 0.05 MG/ML IJ SOLN
INTRAMUSCULAR | Status: DC | PRN
  Administered 2014-04-15: 150 ug via INTRAVENOUS

## 2014-04-15 MED ORDER — LACTATED RINGERS IV SOLN
INTRAVENOUS | Status: DC | PRN
  Administered 2014-04-15 (×2): via INTRAVENOUS

## 2014-04-15 MED ORDER — ASPIRIN EC 325 MG PO TBEC
325.0000 mg | DELAYED_RELEASE_TABLET | Freq: Every day | ORAL | Status: DC
  Administered 2014-04-16: 325 mg via ORAL
  Filled 2014-04-15 (×2): qty 1

## 2014-04-15 MED ORDER — ONDANSETRON HCL 4 MG/2ML IJ SOLN
INTRAMUSCULAR | Status: DC | PRN
  Administered 2014-04-15: 4 mg via INTRAVENOUS

## 2014-04-15 MED ORDER — ACETAMINOPHEN 500 MG PO TABS
1000.0000 mg | ORAL_TABLET | Freq: Once | ORAL | Status: AC
  Administered 2014-04-15: 1000 mg via ORAL

## 2014-04-15 MED ORDER — 0.9 % SODIUM CHLORIDE (POUR BTL) OPTIME
TOPICAL | Status: DC | PRN
  Administered 2014-04-15: 1000 mL

## 2014-04-15 MED ORDER — HYDROCODONE-ACETAMINOPHEN 5-325 MG PO TABS: 1.0000 | ORAL_TABLET | ORAL | Status: DC | PRN

## 2014-04-15 MED ORDER — ACETAMINOPHEN 500 MG PO TABS
ORAL_TABLET | ORAL | Status: AC
  Administered 2014-04-15: 1000 mg via ORAL
  Filled 2014-04-15: qty 2

## 2014-04-15 MED ORDER — LIDOCAINE HCL (CARDIAC) 20 MG/ML IV SOLN
INTRAVENOUS | Status: AC
  Filled 2014-04-15: qty 5

## 2014-04-15 MED ORDER — SODIUM CHLORIDE 0.9 % IJ SOLN
INTRAMUSCULAR | Status: AC
  Filled 2014-04-15: qty 20

## 2014-04-15 MED ORDER — HYDROCHLOROTHIAZIDE 12.5 MG PO CAPS
12.5000 mg | ORAL_CAPSULE | Freq: Every day | ORAL | Status: DC
  Administered 2014-04-16: 12.5 mg via ORAL
  Filled 2014-04-15: qty 1

## 2014-04-15 MED ORDER — DOCUSATE SODIUM 100 MG PO CAPS: 100.0000 mg | ORAL_CAPSULE | Freq: Two times a day (BID) | ORAL | Status: DC

## 2014-04-15 MED ORDER — SODIUM CHLORIDE 0.9 % IV SOLN
10.0000 mg | INTRAVENOUS | Status: DC | PRN
  Administered 2014-04-15: 25 ug/min via INTRAVENOUS

## 2014-04-15 MED ORDER — PHENOL 1.4 % MT LIQD: 1.0000 | OROMUCOSAL | Status: DC | PRN

## 2014-04-15 MED ORDER — ACETAMINOPHEN 650 MG RE SUPP: 650.0000 mg | Freq: Four times a day (QID) | RECTAL | Status: DC | PRN

## 2014-04-15 MED ORDER — ATORVASTATIN CALCIUM 40 MG PO TABS
40.0000 mg | ORAL_TABLET | Freq: Every day | ORAL | Status: DC
  Administered 2014-04-15: 40 mg via ORAL
  Filled 2014-04-15 (×2): qty 1

## 2014-04-15 MED ORDER — PHENYLEPHRINE HCL 10 MG/ML IJ SOLN
INTRAMUSCULAR | Status: DC | PRN
  Administered 2014-04-15: 120 ug via INTRAVENOUS
  Administered 2014-04-15 (×2): 80 ug via INTRAVENOUS

## 2014-04-15 MED ORDER — ACETAMINOPHEN 325 MG PO TABS: 650.0000 mg | ORAL_TABLET | Freq: Four times a day (QID) | ORAL | Status: DC | PRN

## 2014-04-15 MED ORDER — METHOCARBAMOL 1000 MG/10ML IJ SOLN
500.0000 mg | Freq: Four times a day (QID) | INTRAMUSCULAR | Status: DC | PRN
  Filled 2014-04-15: qty 5

## 2014-04-15 MED ORDER — ROCURONIUM BROMIDE 50 MG/5ML IV SOLN
INTRAVENOUS | Status: AC
  Filled 2014-04-15: qty 1

## 2014-04-15 MED ORDER — FENTANYL CITRATE 0.05 MG/ML IJ SOLN
INTRAMUSCULAR | Status: AC
  Filled 2014-04-15: qty 5

## 2014-04-15 MED ORDER — ONDANSETRON HCL 4 MG PO TABS: 4.0000 mg | ORAL_TABLET | Freq: Four times a day (QID) | ORAL | Status: DC | PRN

## 2014-04-15 MED ORDER — HYDROMORPHONE HCL 1 MG/ML IJ SOLN
0.2500 mg | INTRAMUSCULAR | Status: DC | PRN
  Administered 2014-04-15 (×4): 0.5 mg via INTRAVENOUS

## 2014-04-15 MED ORDER — METHOCARBAMOL 500 MG PO TABS
ORAL_TABLET | ORAL | Status: AC
  Administered 2014-04-15: 500 mg via ORAL
  Filled 2014-04-15: qty 1

## 2014-04-15 MED ORDER — CEFAZOLIN SODIUM-DEXTROSE 2-3 GM-% IV SOLR
2.0000 g | Freq: Four times a day (QID) | INTRAVENOUS | Status: AC
  Administered 2014-04-15 – 2014-04-16 (×2): 2 g via INTRAVENOUS
  Filled 2014-04-15 (×4): qty 50

## 2014-04-15 MED ORDER — ESCITALOPRAM OXALATE 20 MG PO TABS
20.0000 mg | ORAL_TABLET | Freq: Every day | ORAL | Status: DC
  Administered 2014-04-16: 20 mg via ORAL
  Filled 2014-04-15: qty 1

## 2014-04-15 MED ORDER — MENTHOL 3 MG MT LOZG: 1.0000 | LOZENGE | OROMUCOSAL | Status: DC | PRN

## 2014-04-15 MED ORDER — BUPIVACAINE LIPOSOME 1.3 % IJ SUSP
20.0000 mL | INTRAMUSCULAR | Status: AC
  Administered 2014-04-15: 20 mL
  Filled 2014-04-15: qty 20

## 2014-04-15 MED ORDER — SODIUM CHLORIDE 0.9 % IJ SOLN
INTRAMUSCULAR | Status: AC
  Filled 2014-04-15: qty 10

## 2014-04-15 MED ORDER — EPHEDRINE SULFATE 50 MG/ML IJ SOLN
INTRAMUSCULAR | Status: AC
Start: 2014-04-15 — End: 2014-04-15
  Filled 2014-04-15: qty 2

## 2014-04-15 MED ORDER — HYDROCODONE-ACETAMINOPHEN 5-325 MG PO TABS
1.0000 | ORAL_TABLET | ORAL | Status: DC | PRN
  Administered 2014-04-15 – 2014-04-16 (×5): 2 via ORAL
  Filled 2014-04-15 (×5): qty 2

## 2014-04-15 MED ORDER — EPHEDRINE SULFATE 50 MG/ML IJ SOLN
INTRAMUSCULAR | Status: DC | PRN
  Administered 2014-04-15: 10 mg via INTRAVENOUS

## 2014-04-15 MED ORDER — LOSARTAN POTASSIUM 50 MG PO TABS
100.0000 mg | ORAL_TABLET | Freq: Every day | ORAL | Status: DC
  Administered 2014-04-16: 100 mg via ORAL
  Filled 2014-04-15: qty 2

## 2014-04-15 MED ORDER — METHOCARBAMOL 500 MG PO TABS
500.0000 mg | ORAL_TABLET | Freq: Four times a day (QID) | ORAL | Status: DC | PRN
  Administered 2014-04-15 – 2014-04-16 (×4): 500 mg via ORAL
  Filled 2014-04-15 (×4): qty 1

## 2014-04-15 MED ORDER — NEOSTIGMINE METHYLSULFATE 10 MG/10ML IV SOLN
INTRAVENOUS | Status: DC | PRN
  Administered 2014-04-15: 2 mg via INTRAVENOUS

## 2014-04-15 MED ORDER — DEXTROSE-NACL 5-0.45 % IV SOLN
INTRAVENOUS | Status: DC
  Administered 2014-04-15: 18:00:00 via INTRAVENOUS

## 2014-04-15 MED ORDER — PHENYLEPHRINE HCL 10 MG/ML IJ SOLN
INTRAMUSCULAR | Status: AC
  Filled 2014-04-15: qty 1

## 2014-04-15 MED ORDER — MORPHINE SULFATE 2 MG/ML IJ SOLN: 2.0000 mg | INTRAMUSCULAR | Status: DC | PRN

## 2014-04-15 MED ORDER — ONDANSETRON HCL 4 MG/2ML IJ SOLN: 4.0000 mg | Freq: Four times a day (QID) | INTRAMUSCULAR | Status: DC | PRN

## 2014-04-15 MED ORDER — ASPIRIN EC 325 MG PO TBEC: 325.0000 mg | DELAYED_RELEASE_TABLET | Freq: Every day | ORAL | Status: DC

## 2014-04-15 MED ORDER — LOSARTAN POTASSIUM-HCTZ 100-12.5 MG PO TABS: 1.0000 | ORAL_TABLET | Freq: Every day | ORAL | Status: DC

## 2014-04-15 MED ORDER — DOCUSATE SODIUM 100 MG PO CAPS
100.0000 mg | ORAL_CAPSULE | Freq: Two times a day (BID) | ORAL | Status: DC
  Administered 2014-04-15 – 2014-04-16 (×2): 100 mg via ORAL
  Filled 2014-04-15 (×2): qty 1

## 2014-04-15 MED ORDER — MIDAZOLAM HCL 2 MG/2ML IJ SOLN
INTRAMUSCULAR | Status: AC
  Filled 2014-04-15: qty 2

## 2014-04-15 MED ORDER — ONDANSETRON HCL 4 MG PO TABS: 4.0000 mg | ORAL_TABLET | Freq: Three times a day (TID) | ORAL | Status: DC | PRN

## 2014-04-15 MED ORDER — METOCLOPRAMIDE HCL 5 MG/ML IJ SOLN: 5.0000 mg | Freq: Three times a day (TID) | INTRAMUSCULAR | Status: DC | PRN

## 2014-04-15 MED ORDER — CELECOXIB 200 MG PO CAPS
200.0000 mg | ORAL_CAPSULE | Freq: Two times a day (BID) | ORAL | Status: DC
  Administered 2014-04-15 – 2014-04-16 (×2): 200 mg via ORAL
  Filled 2014-04-15 (×3): qty 1

## 2014-04-15 MED ORDER — DEXTROSE-NACL 5-0.45 % IV SOLN: 100.0000 mL/h | INTRAVENOUS | Status: DC

## 2014-04-15 MED ORDER — ROCURONIUM BROMIDE 100 MG/10ML IV SOLN
INTRAVENOUS | Status: DC | PRN
  Administered 2014-04-15: 35 mg via INTRAVENOUS

## 2014-04-15 MED ORDER — ARTIFICIAL TEARS OP OINT
TOPICAL_OINTMENT | OPHTHALMIC | Status: AC
  Filled 2014-04-15: qty 3.5

## 2014-04-15 MED ORDER — METOCLOPRAMIDE HCL 10 MG PO TABS: 5.0000 mg | ORAL_TABLET | Freq: Three times a day (TID) | ORAL | Status: DC | PRN

## 2014-04-15 MED ORDER — DEXAMETHASONE SODIUM PHOSPHATE 10 MG/ML IJ SOLN
10.0000 mg | Freq: Once | INTRAMUSCULAR | Status: AC
  Administered 2014-04-16: 10 mg via INTRAVENOUS
  Filled 2014-04-15: qty 1

## 2014-04-15 MED ORDER — PROPOFOL 10 MG/ML IV BOLUS
INTRAVENOUS | Status: DC | PRN
  Administered 2014-04-15: 200 mg via INTRAVENOUS

## 2014-04-15 MED ORDER — ONDANSETRON HCL 4 MG/2ML IJ SOLN: 4.0000 mg | Freq: Once | INTRAMUSCULAR | Status: DC | PRN

## 2014-04-15 MED ORDER — MIDAZOLAM HCL 5 MG/5ML IJ SOLN
INTRAMUSCULAR | Status: DC | PRN
  Administered 2014-04-15: 2 mg via INTRAVENOUS

## 2014-04-15 MED ORDER — GLYCOPYRROLATE 0.2 MG/ML IJ SOLN
INTRAMUSCULAR | Status: DC | PRN
  Administered 2014-04-15: 0.4 mg via INTRAVENOUS
  Administered 2014-04-15: .4 mg via INTRAVENOUS

## 2014-04-15 MED ORDER — CHLORHEXIDINE GLUCONATE 4 % EX LIQD
60.0000 mL | Freq: Once | CUTANEOUS | Status: DC
Start: 2014-04-15 — End: 2014-04-15
  Filled 2014-04-15: qty 60

## 2014-04-15 MED ORDER — LIDOCAINE HCL (CARDIAC) 20 MG/ML IV SOLN
INTRAVENOUS | Status: DC | PRN
  Administered 2014-04-15: 100 mg via INTRAVENOUS

## 2014-04-15 MED ORDER — LACTATED RINGERS IV SOLN
INTRAVENOUS | Status: DC
  Administered 2014-04-15: 09:00:00 via INTRAVENOUS

## 2014-04-15 SURGICAL SUPPLY — 69 items
BLADE SAW SGTL 18X1.27X75 (BLADE) ×2 IMPLANT
BLADE SURG ROTATE 9660 (MISCELLANEOUS) IMPLANT
BNDG COHESIVE 6X5 TAN STRL LF (GAUZE/BANDAGES/DRESSINGS) ×1 IMPLANT
CAPT HIP TOTAL 2 ×1 IMPLANT
COVER SURGICAL LIGHT HANDLE (MISCELLANEOUS) ×2 IMPLANT
DRAPE C-ARM 42X72 X-RAY (DRAPES) ×1 IMPLANT
DRAPE IMP U-DRAPE 54X76 (DRAPES) ×4 IMPLANT
DRAPE INCISE IOBAN 66X45 STRL (DRAPES) ×2 IMPLANT
DRAPE ORTHO SPLIT 77X108 STRL (DRAPES) ×4
DRAPE PROXIMA HALF (DRAPES) ×4 IMPLANT
DRAPE SURG 17X23 STRL (DRAPES) ×2 IMPLANT
DRAPE SURG ORHT 6 SPLT 77X108 (DRAPES) ×2 IMPLANT
DRAPE U-SHAPE 47X51 STRL (DRAPES) ×4 IMPLANT
DRSG AQUACEL AG ADV 3.5X10 (GAUZE/BANDAGES/DRESSINGS) ×2 IMPLANT
DURAPREP 26ML APPLICATOR (WOUND CARE) ×3 IMPLANT
ELECT BLADE 4.0 EZ CLEAN MEGAD (MISCELLANEOUS) ×2
ELECT CAUTERY BLADE 6.4 (BLADE) ×2 IMPLANT
ELECT REM PT RETURN 9FT ADLT (ELECTROSURGICAL) ×2
ELECTRODE BLDE 4.0 EZ CLN MEGD (MISCELLANEOUS) ×1 IMPLANT
ELECTRODE REM PT RTRN 9FT ADLT (ELECTROSURGICAL) ×1 IMPLANT
FACESHIELD WRAPAROUND (MASK) ×4 IMPLANT
FACESHIELD WRAPAROUND OR TEAM (MASK) ×2 IMPLANT
GLOVE BIO SURGEON STRL SZ7.5 (GLOVE) ×2 IMPLANT
GLOVE BIOGEL M 7.0 STRL (GLOVE) IMPLANT
GLOVE BIOGEL PI IND STRL 7.5 (GLOVE) IMPLANT
GLOVE BIOGEL PI IND STRL 8 (GLOVE) ×2 IMPLANT
GLOVE BIOGEL PI INDICATOR 7.5 (GLOVE)
GLOVE BIOGEL PI INDICATOR 8 (GLOVE) ×2
GLOVE BIOGEL PI ORTHO PRO SZ7 (GLOVE) ×1
GLOVE BIOGEL PI ORTHO PRO SZ8 (GLOVE) ×1
GLOVE PI ORTHO PRO STRL SZ7 (GLOVE) IMPLANT
GLOVE PI ORTHO PRO STRL SZ8 (GLOVE) IMPLANT
GLOVE SURG ORTHO 8.0 STRL STRW (GLOVE) ×1 IMPLANT
GLOVE SURG SS PI 7.0 STRL IVOR (GLOVE) ×1 IMPLANT
GLOVE SURG SS PI 8.0 STRL IVOR (GLOVE) ×2 IMPLANT
GOWN STRL REUS W/ TWL LRG LVL3 (GOWN DISPOSABLE) ×3 IMPLANT
GOWN STRL REUS W/ TWL XL LVL3 (GOWN DISPOSABLE) ×1 IMPLANT
GOWN STRL REUS W/TWL 2XL LVL3 (GOWN DISPOSABLE) ×2 IMPLANT
GOWN STRL REUS W/TWL LRG LVL3 (GOWN DISPOSABLE) ×4
GOWN STRL REUS W/TWL XL LVL3 (GOWN DISPOSABLE) ×2
KIT BASIN OR (CUSTOM PROCEDURE TRAY) ×2 IMPLANT
KIT ROOM TURNOVER OR (KITS) ×2 IMPLANT
LIQUID BAND (GAUZE/BANDAGES/DRESSINGS) ×1 IMPLANT
MANIFOLD NEPTUNE II (INSTRUMENTS) ×2 IMPLANT
MARKER SKIN DUAL TIP RULER LAB (MISCELLANEOUS) ×1 IMPLANT
NDL 18GX1X1/2 (RX/OR ONLY) (NEEDLE) ×1 IMPLANT
NDL SAFETY ECLIPSE 18X1.5 (NEEDLE) ×1 IMPLANT
NEEDLE 18GX1X1/2 (RX/OR ONLY) (NEEDLE) ×2 IMPLANT
NEEDLE HYPO 18GX1.5 SHARP (NEEDLE) ×2
NS IRRIG 1000ML POUR BTL (IV SOLUTION) ×2 IMPLANT
PACK TOTAL JOINT (CUSTOM PROCEDURE TRAY) ×2 IMPLANT
PACK UNIVERSAL I (CUSTOM PROCEDURE TRAY) ×2 IMPLANT
PAD ARMBOARD 7.5X6 YLW CONV (MISCELLANEOUS) ×4 IMPLANT
SPONGE LAP 18X18 X RAY DECT (DISPOSABLE) IMPLANT
SUT MNCRL AB 4-0 PS2 18 (SUTURE) ×2 IMPLANT
SUT MON AB 2-0 CT1 36 (SUTURE) ×2 IMPLANT
SUT VIC AB 0 CT1 27 (SUTURE) ×4
SUT VIC AB 0 CT1 27XBRD ANBCTR (SUTURE) ×1 IMPLANT
SUT VIC AB 1 CT1 27 (SUTURE) ×2
SUT VIC AB 1 CT1 27XBRD ANBCTR (SUTURE) ×1 IMPLANT
SUT VIC AB 2-0 CT1 27 (SUTURE) ×2
SUT VIC AB 2-0 CT1 TAPERPNT 27 (SUTURE) IMPLANT
SYR 50ML LL SCALE MARK (SYRINGE) ×2 IMPLANT
TOWEL OR 17X24 6PK STRL BLUE (TOWEL DISPOSABLE) ×2 IMPLANT
TOWEL OR 17X26 10 PK STRL BLUE (TOWEL DISPOSABLE) ×2 IMPLANT
TOWEL OR NON WOVEN STRL DISP B (DISPOSABLE) ×2 IMPLANT
TRAY FOLEY CATH 16FRSI W/METER (SET/KITS/TRAYS/PACK) IMPLANT
TUBE SUCT ARGYLE STRL (TUBING) ×1 IMPLANT
WATER STERILE IRR 1000ML POUR (IV SOLUTION) ×2 IMPLANT

## 2014-04-15 NOTE — Evaluation (Signed)
Physical Therapy Evaluation Patient Details Name: Vanessa Harmon MRN: [id number] DOB: 06-30-1950 Today's Date: 04/15/2014   History of Present Illness  Pt admitted s/p R THA via direct anterior approach.   Clinical Impression  Pt presents with decreased mobility and ROM in RLE, however performed all mobility at S level on POD 0.  Discussed need to practice stairs, but plans to D/C tomorrow.  Continue acute services for deficits listed below.  PT recommends OP PT for follow up to increase strength, ROM and independence.     Follow Up Recommendations Home health PT;Outpatient PT (OP PT if can get ride)    Equipment Recommendations  None recommended by PT    Recommendations for Other Services       Precautions / Restrictions Precautions Precautions: None Restrictions Weight Bearing Restrictions: No      Mobility  Bed Mobility Overal bed mobility: Modified Independent             General bed mobility comments: with HOB flat and without rails  Transfers Overall transfer level: Needs assistance Equipment used: Rolling walker (2 wheeled) Transfers: Sit to/from Stand Sit to Stand: Supervision         General transfer comment: Cues for hand placement and safety when sitting and standing.  Performed from bed and again to/from bedside commode over toilet.   Ambulation/Gait Ambulation/Gait assistance: Supervision Ambulation Distance (Feet): 150 Feet Assistive device: Rolling walker (2 wheeled) Gait Pattern/deviations: Step-through pattern;Decreased stance time - right;Decreased stride length;Antalgic     General Gait Details: Pt with min antalgic gait pattern, but did improve with continued gait.  Min cues for relaxed posture.   Stairs            Wheelchair Mobility    Modified Rankin (Stroke Patients Only)       Balance Overall balance assessment: Modified Independent                                           Pertinent Vitals/Pain  Pain Assessment: 0-10 Pain Score: 4  Pain Descriptors / Indicators: Aching Pain Intervention(s): Repositioned    Home Living Family/patient expects to be discharged to:: Private residence Living Arrangements: Spouse/significant other Available Help at Discharge: Family;Available 24 hours/day Type of Home: House Home Access: Stairs to enter Entrance Stairs-Rails: Right Entrance Stairs-Number of Steps: 3 Home Layout: Two level;Able to live on main level with bedroom/bathroom Home Equipment: Gilford Rile - 2 wheels;Shower seat;Grab bars - toilet;Transport chair      Prior Function Level of Independence: Independent         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     Cervical / Trunk Assessment: Normal  Communication   Communication: No difficulties  Cognition Arousal/Alertness: Awake/alert Behavior During Therapy: WFL for tasks assessed/performed Overall Cognitive Status: Within Functional Limits for tasks assessed                      General Comments      Exercises        Assessment/Plan    PT Assessment Patient  needs continued PT services  PT Diagnosis Difficulty walking;Acute pain   PT Problem List Decreased strength;Decreased range of motion;Decreased balance;Decreased mobility;Pain  PT Treatment Interventions DME instruction;Gait training;Stair training;Functional mobility training;Therapeutic activities;Therapeutic exercise;Balance training;Patient/family education   PT Goals (Current goals can be found in the Care Plan section) Acute Rehab PT Goals Patient Stated Goal: to return home PT Goal Formulation: With patient/family Time For Goal Achievement: 04/17/14 Potential to Achieve Goals: Good    Frequency 7X/week    Barriers to discharge        Co-evaluation               End of Session   Activity Tolerance: Patient tolerated treatment well Patient left: in chair;with call bell/phone within reach;with family/visitor present Nurse Communication: Mobility status         Time: 7482-7078 PT Time Calculation (min) (ACUTE ONLY): 26 min   Charges:   PT Evaluation $Initial PT Evaluation Tier I: 1 Procedure PT Treatments $Gait Training: 8-22 mins $Therapeutic Activity: 8-22 mins   PT G Codes:        Denice Bors 04/15/2014, 3:43 PM

## 2014-04-15 NOTE — Anesthesia Postprocedure Evaluation (Signed)
  Anesthesia Post-op Note  Patient: Vanessa Harmon  Procedure(s) Performed: Procedure(s): RIGHT TOTAL HIP ARTHROPLASTY ANTERIOR APPROACH (Right)  Patient Location: PACU  Anesthesia Type:General  Level of Consciousness: awake and alert   Airway and Oxygen Therapy: Patient Spontanous Breathing and Patient connected to nasal cannula oxygen  Post-op Pain: mild  Post-op Assessment: Post-op Vital signs reviewed, Respiratory Function Stable, Patent Airway and No signs of Nausea or vomiting  Post-op Vital Signs: Reviewed and stable  Last Vitals:  Filed Vitals:   04/15/14 1300  BP:   Pulse: 64  Temp:   Resp: 12    Complications: No apparent anesthesia complications

## 2014-04-15 NOTE — Discharge Instructions (Signed)
Keep your dressing on and dry until follow up  [place]ales weight as tolerated  Resume your indocin daily for 30 days  Take Aspirin 325 daily for 30 days then go back to a baby aspirin daily  Call Pressley Tadesse/[facility] with any issues or questions

## 2014-04-15 NOTE — Anesthesia Procedure Notes (Signed)
Procedure Name: Intubation Date/Time: 04/15/2014 10:38 AM Performed by: Neldon Newport Pre-anesthesia Checklist: Patient being monitored, Suction available, Emergency Drugs available, Patient identified and Timeout performed Patient Re-evaluated:Patient Re-evaluated prior to inductionOxygen Delivery Method: Circle system utilized Preoxygenation: Pre-oxygenation with 100% oxygen Intubation Type: IV induction Ventilation: Mask ventilation without difficulty Laryngoscope Size: Mac and 3 Grade View: Grade I Tube type: Oral Tube size: 7.5 mm Number of attempts: 1 Placement Confirmation: positive ETCO2,  ETT inserted through vocal cords under direct vision and breath sounds checked- equal and bilateral Secured at: 22 cm Tube secured with: Tape Dental Injury: Teeth and Oropharynx as per pre-operative assessment

## 2014-04-15 NOTE — Transfer of Care (Signed)
Immediate Anesthesia Transfer of Care Note  Patient: Vanessa Harmon  Procedure(s) Performed: Procedure(s): RIGHT TOTAL HIP ARTHROPLASTY ANTERIOR APPROACH (Right)  Patient Location: PACU  Anesthesia Type:General  Level of Consciousness: awake, alert  and oriented  Airway & Oxygen Therapy: Patient Spontanous Breathing and Patient connected to nasal cannula oxygen  Post-op Assessment: Report given to PACU RN, Post -op Vital signs reviewed and stable and Patient moving all extremities X 4  Post vital signs: Reviewed and stable  Complications: No apparent anesthesia complications

## 2014-04-15 NOTE — Op Note (Signed)
04/15/2014  11:51 AM  PATIENT:  Vanessa Harmon   MRN: [id number]  PRE-OPERATIVE DIAGNOSIS:  OA RIGHT HIP  POST-OPERATIVE DIAGNOSIS:  OA RIGHT HIP  PROCEDURE:  Procedure(s): RIGHT TOTAL HIP ARTHROPLASTY ANTERIOR APPROACH  PREOPERATIVE INDICATIONS:    Vanessa Harmon is an 63 y.o. female who has a diagnosis of <principal problem not specified> and elected for surgical management after failing conservative treatment.  The risks benefits and alternatives were discussed with the patient including but not limited to the risks of nonoperative treatment, versus surgical intervention including infection, bleeding, nerve injury, periprosthetic fracture, the need for revision surgery, dislocation, leg length discrepancy, blood clots, cardiopulmonary complications, morbidity, mortality, among others, and they were willing to proceed.     OPERATIVE REPORT     SURGEON:   Renette Butters, MD    ASSISTANT:  Ky Barban, He was necessary for efficiency and safety of the case.     ANESTHESIA:  General    COMPLICATIONS:  None.     COMPONENTS:  Stryker acolade fit femur size 2 with a 36 mm +0 head ball and a PSL acetabular shell size 52 with a  polyethylene liner    PROCEDURE IN DETAIL:   The patient was met in the holding area and  identified.  The appropriate hip was identified and marked at the operative site.  The patient was then transported to the OR  and  placed under general anesthesia.  At that point, the patient was  placed in the supine position and  secured to the operating room table and all bony prominences padded. He received pre-operative antibiotics    The operative lower extremity was prepped from the iliac crest to the distal leg.  Sterile draping was performed.  Time out was performed prior to incision.      Skin incision was made just 2 cm lateral to the ASIS  extending in line with the tensor fascia lata. Electrocautery was used to control all bleeders. I dissected down  sharply to the fascia of the tensor fascia lata was confirmed that the muscle fibers beneath were running posteriorly. I then incised the fascia over the superficial tensor fascia lata in line with the incision. The fascia was elevated off the anterior aspect of the muscle the muscle was retracted posteriorly and protected throughout the case. I then used electrocautery to incise the tensor fascia lata fascia control and all bleeders. Immediately visible was the fat over top of the anterior neck and capsule.  I removed the anterior fat from the capsule and elevated the rectus muscle off of the anterior capsule. I then removed a large time of capsule. The retractors were then placed over the anterior acetabulum as well as around the superior and inferior neck.  I then removed a section of the femoral neck and a napkin ring fashion. Then used the power course to remove the femoral head from the acetabulum and thoroughly irrigated the acetabulum. I sized the femoral head.    I then exposed the deep acetabulum, cleared out any tissue including the ligamentum teres.   After adequate visualization, I excised the labrum, and then sequentially reamed.  I placed the trial acetabulum, which seated nicely, and then impacted the real cup into place.  Appropriate version and inclination was confirmed clinically matching their bony anatomy, and also with the use transverse acetabular ligament. I then placed the polyethylene liner in place  I then abducted the leg and released the external rotators from the  posterior femur allowing it to be easily delivered up lateral and anterior to the acetabulum for preparation of the femoral canal.    I then prepared the proximal femur using the cookie-cutter and then sequentially reamed and broached.  A trial broach, neck, and head was utilized, and I reduced the hip and it was found to have excellent stability with functional range of motion..  I then impacted the real femoral  prosthesis into place into the appropriate version, slightly anteverted to the normal anatomy, and I impacted the real head ball into place. The hip was then reduced and taken through functional range of motion and found to have excellent stability. Leg lengths were restored.  I then irrigated the hip copiously again with, and repaired the fascia with Vicryl, followed by monocryl for the subcutaneous tissue, Monocryl for the skin, Steri-Strips and sterile gauze. The wounds were injected. The patient was then awakened and returned to PACU in stable and satisfactory condition. There were no complications.  POST OPERATIVE PLAN: WBAT, DVT px: SCD's/TED and ASA 325  Edmonia Lynch, MD Orthopedic Surgeon [phone]   This note was generated using a template and dragon dictation system. In light of that, I have reviewed the note and all aspects of it are applicable to this case. Any dictation errors are due to the computerized dictation system.

## 2014-04-15 NOTE — Anesthesia Preprocedure Evaluation (Addendum)
Anesthesia Evaluation  Patient identified by MRN, date of birth, ID band Patient awake    Reviewed: Allergy & Precautions, H&P , NPO status , Patient's Chart, lab work & pertinent test results  Airway Mallampati: II       Dental  (+) Teeth Intact, Dental Advidsory Given   Pulmonary former smoker,          Cardiovascular hypertension,     Neuro/Psych    GI/Hepatic GERD-  ,  Endo/Other  diabetes, Type 2  Renal/GU      Musculoskeletal  (+) Arthritis -,   Abdominal   Peds  Hematology   Anesthesia Other Findings   Reproductive/Obstetrics                            Anesthesia Physical Anesthesia Plan  ASA: II  Anesthesia Plan: General   Post-op Pain Management:    Induction: Intravenous  Airway Management Planned: LMA and Oral ETT  Additional Equipment:   Intra-op Plan:   Post-operative Plan: Extubation in OR  Informed Consent: I have reviewed the patients History and Physical, chart, labs and discussed the procedure including the risks, benefits and alternatives for the proposed anesthesia with the patient or authorized representative who has indicated his/her understanding and acceptance.   Dental Advisory Given  Plan Discussed with: CRNA, Anesthesiologist and Surgeon  Anesthesia Plan Comments:        Anesthesia Quick Evaluation

## 2014-04-15 NOTE — Interval H&P Note (Signed)
History and Physical Interval Note:  04/15/2014 8:04 AM  Vanessa Harmon  has presented today for surgery, with the diagnosis of OA RIGHT HIP  The various methods of treatment have been discussed with the patient and family. After consideration of risks, benefits and other options for treatment, the patient has consented to  Procedure(s): RIGHT TOTAL HIP ARTHROPLASTY ANTERIOR APPROACH (Right) as a surgical intervention .  The patient's history has been reviewed, patient examined, no change in status, stable for surgery.  I have reviewed the patient's chart and labs.  Questions were answered to the patient's satisfaction.     Rolin Schult, D

## 2014-04-16 ENCOUNTER — Encounter (HOSPITAL_COMMUNITY): Payer: Self-pay | Admitting: Orthopedic Surgery

## 2014-04-16 NOTE — Progress Notes (Signed)
Vanessa Harmon discharged home per MD order. Discharge instructions reviewed and discussed with patient. All questions and concerns answered. Copy of instructions and scripts given to patient. IV removed.  Patient escorted to car by staff in a wheelchair. No distress noted upon discharge.   [name] R 04/16/2014 2:35 PM

## 2014-04-16 NOTE — Discharge Summary (Signed)
Physician Discharge Summary  Patient ID: Vanessa Harmon MRN: [id number] DOB/AGE: [birth year] 63 y.o.  Admit date: 04/15/2014 Discharge date: 04/16/2014  Admission Diagnoses:  <principal problem not specified>  Discharge Diagnoses:  Active Problems:   DJD (degenerative joint disease)   Past Medical History  Diagnosis Date  . Rhinitis, allergic     uses Flonase daily  . PSVT (paroxysmal supraventricular tachycardia)   . Adenomatous polyp   . Hyperlipidemia     takes Atorvastatin daily  . Anxiety     takes Ativan daily as needed  . Essential hypertension, benign     takes Hyzaar and Metoprolol daily  . Pneumonia as a child  . History of bronchitis 5 rys ago  . Joint pain   . Chronic back pain     scoliosis   . Constipation     takes miralax every other day  . History of colon polyps   . Urinary urgency   . History of blood transfusion   . Depression     takes Lexapro daily  . Insomnia     takes Melatonin nightly  . History of shingles   . Arthritis     inflammatory arthritis (Dr. Dagoberto Ligas)    Surgeries: Procedure(s): RIGHT TOTAL HIP ARTHROPLASTY ANTERIOR APPROACH on 04/15/2014   Consultants (if any):    Discharged Condition: Improved  Hospital Course: Vanessa Harmon is an 63 y.o. female who was admitted 04/15/2014 with a diagnosis of <principal problem not specified> and went to the operating room on 04/15/2014 and underwent the above named procedures.    She was given perioperative antibiotics:  Anti-infectives    Start     Dose/Rate Route Frequency Ordered Stop   04/15/14 1700  ceFAZolin (ANCEF) IVPB 2 g/50 mL premix     2 g100 mL/hr over 30 Minutes Intravenous Every 6 hours 04/15/14 1400 04/16/14 0034   04/15/14 0600  ceFAZolin (ANCEF) IVPB 2 g/50 mL premix     2 g100 mL/hr over 30 Minutes Intravenous On call to O.R. 04/14/14 1345 04/15/14 1045    .  She was given sequential compression devices, early ambulation, and ASA 325 for DVT  prophylaxis.  She benefited maximally from the hospital stay and there were no complications.    Recent vital signs:  Filed Vitals:   04/16/14 0432  BP: 146/62  Pulse: 56  Temp: 98.8 F (37.1 C)  Resp: 18    Recent laboratory studies:  Lab Results  Component Value Date   HGB 12.6 04/02/2014   HGB 13.3 01/13/2014   HGB 13.4 11/20/2013   Lab Results  Component Value Date   WBC 7.1 04/02/2014   PLT 293 04/02/2014   Lab Results  Component Value Date   INR 0.96 04/02/2014   Lab Results  Component Value Date   NA 137 04/02/2014   K 4.5 04/02/2014   CL 99 04/02/2014   CO2 27 04/02/2014   BUN 22 04/02/2014   CREATININE 0.69 04/02/2014   GLUCOSE 113* 04/02/2014    Discharge Medications:     Medication List    STOP taking these medications        HYDROcodone-acetaminophen 10-325 MG per tablet  Commonly known as:  NORCO  Replaced by:  HYDROcodone-acetaminophen 5-325 MG per tablet      TAKE these medications        aspirin EC 325 MG tablet  Take 1 tablet (325 mg total) by mouth daily.     atorvastatin 80 MG tablet  Commonly known as:  LIPITOR  Take 40 mg by mouth daily.     azelastine 0.1 % nasal spray  Commonly known as:  ASTELIN  Place 1 spray into both nostrils daily as needed for rhinitis. Use in each nostril as directed     CALCIUM + D PO  Take by mouth 2 (two) times daily.     docusate sodium 100 MG capsule  Commonly known as:  COLACE  Take 1 capsule (100 mg total) by mouth 2 (two) times daily. Continue this while taking narcotics to help with bowel movements     escitalopram 20 MG tablet  Commonly known as:  LEXAPRO  Take 20 mg by mouth daily.     escitalopram 20 MG tablet  Commonly known as:  LEXAPRO  TAKE 1 TABLET BY MOUTH DAILY     fish oil-omega-3 fatty acids 1000 MG capsule  Take 1 g by mouth 2 (two) times daily.     fluticasone 50 MCG/ACT nasal spray  Commonly known as:  FLONASE  Place 1 spray into both nostrils daily.     folic  acid 1 MG tablet  Commonly known as:  FOLVITE  Take 1 mg by mouth daily.     HYDROcodone-acetaminophen 5-325 MG per tablet  Commonly known as:  NORCO  Take 1-2 tablets by mouth every 4 (four) hours as needed for moderate pain.     indomethacin 75 MG CR capsule  Commonly known as:  INDOCIN SR  Take 75 mg by mouth daily with breakfast.     LORazepam 1 MG tablet  Commonly known as:  ATIVAN  Take 1 tablet (1 mg total) by mouth 2 (two) times daily as needed for anxiety.     losartan-hydrochlorothiazide 100-12.5 MG per tablet  Commonly known as:  HYZAAR  Take 1 tablet by mouth daily.     losartan-hydrochlorothiazide 100-12.5 MG per tablet  Commonly known as:  HYZAAR  TAKE 1 TABLET BY MOUTH ONCE DAILY     Melatonin 10 MG Caps  Take 10 mg by mouth at bedtime.     methotrexate 25 MG/ML injection  Inject 15 mg into the skin once a week. 15 mg=0.6 ml  Every Monday     ondansetron 4 MG tablet  Commonly known as:  ZOFRAN  Take 1 tablet (4 mg total) by mouth every 8 (eight) hours as needed for nausea.     polyethylene glycol packet  Commonly known as:  MIRALAX / GLYCOLAX  Take 17 g by mouth daily as needed for mild constipation.     vitamin C 1000 MG tablet  Take 1,000 mg by mouth daily.        Diagnostic Studies: Dg Pelvis Portable  04/15/2014   CLINICAL DATA:  63 year old status post right hip replacement  EXAM: PORTABLE PELVIS 1-2 VIEWS  COMPARISON:  09/05/2008  FINDINGS: The patient is status post right hip arthroplasty. Soft tissue emphysema is compatible with recent surgery.  The acetabular and femoral components are well seated. There is no dislocation.  Mild degenerative changes of the left hip are present. The sacroiliac joints are unremarkable. The pubic symphysis is well approximated. The bone mineralization appears diminished. Partially visualized lumbosacral hardware is noted.  IMPRESSION: Radiographically intact right hip arthroplasty.   Electronically Signed   By:  Rosemarie Ax   On: 04/15/2014 13:59    Disposition: 01-Home or Self Care        Follow-up Information    Follow up with Geovany Trudo, D,  MD.   Specialty:  Orthopedic Surgery   Why:  as scheduled   Contact information:   [address] [place] [address] [phone]        Signed: Edmonia Lynch, D 04/16/2014, 7:37 AM

## 2014-04-16 NOTE — Progress Notes (Signed)
Physical Therapy Treatment Patient Details Name: CARISMA TROUPE MRN: [id number] DOB: 05-Sep-1950 Today's Date: 04/16/2014    History of Present Illness Pt admitted s/p R THA via direct anterior approach.     PT Comments    Pt seen for family education. Pt able to perform teachback technique for stair mobility. Min cues for safety. Pt safe from PT standpoint to D/C home.   Follow Up Recommendations  Outpatient PT     Equipment Recommendations  None recommended by PT    Recommendations for Other Services       Precautions / Restrictions Precautions Precautions: None Restrictions Weight Bearing Restrictions: Yes RLE Weight Bearing: Weight bearing as tolerated    Mobility  Bed Mobility               General bed mobility comments: up in chair  Transfers Overall transfer level: Modified independent Equipment used: Rolling walker (2 wheeled) Transfers: Sit to/from Stand Sit to Stand: Modified independent (Device/Increase time)         General transfer comment: demo good technique   Ambulation/Gait Ambulation/Gait assistance: Modified independent (Device/Increase time) Ambulation Distance (Feet): 400 Feet Assistive device: Rolling walker (2 wheeled) Gait Pattern/deviations: Step-through pattern Gait velocity: decreased Gait velocity interpretation: Below normal speed for age/gender General Gait Details: demo good step through technique; no overt  LOB   Stairs Stairs: Yes Stairs assistance: Min guard Stair Management: No rails;One rail Right;Step to pattern;Forwards;Backwards;With walker Number of Stairs: 6 General stair comments: husband present; wife able to perform teachback technique and educate husband on proper stair mangement   Wheelchair Mobility    Modified Rankin (Stroke Patients Only)       Balance Overall balance assessment: No apparent balance deficits (not formally assessed)                                  Cognition  Arousal/Alertness: Awake/alert Behavior During Therapy: WFL for tasks assessed/performed Overall Cognitive Status: Within Functional Limits for tasks assessed                      Exercises Total Joint Exercises Ankle Circles/Pumps: AROM;Both;10 reps;Seated Quad Sets: AROM;Right;10 reps;Seated Short Arc Quad: AROM;Strengthening;Right;10 reps;Other (comment) (hold 5 sec) Heel Slides: AAROM;Right;10 reps;Seated Hip ABduction/ADduction: AAROM;Right;10 reps;Seated Straight Leg Raises: AROM;Right;10 reps;Seated Long Arc Quad: AROM;Strengthening;Right;10 reps;Seated    General Comments General comments (skin integrity, edema, etc.): reviewed car transfer technique; no questions regarding HEp      Pertinent Vitals/Pain Pain Assessment: 0-10 Pain Score: 1  Pain Location: Rt hip Pain Descriptors / Indicators: Sore Pain Intervention(s): Repositioned;Premedicated before session;Monitored during session    Home Living                      Prior Function            PT Goals (current goals can now be found in the care plan section) Acute Rehab PT Goals Patient Stated Goal: home after this  PT Goal Formulation: With patient/family Time For Goal Achievement: 04/17/14 Potential to Achieve Goals: Good Progress towards PT goals: Progressing toward goals    Frequency  7X/week    PT Plan Current plan remains appropriate    Co-evaluation             End of Session Equipment Utilized During Treatment: Gait belt Activity Tolerance: Patient tolerated treatment well Patient left: in chair;with call bell/phone within reach  Time: 4360-6770 PT Time Calculation (min) (ACUTE ONLY): 13 min  Charges:  $Gait Training: 8-22 mins $Therapeutic Exercise: 8-22 mins                    G Codes:      Gustavus Bryant, [place] 04/16/2014, 3:34 PM

## 2014-04-16 NOTE — Evaluation (Signed)
Occupational Therapy Evaluation Patient Details Name: Vanessa Harmon MRN: [id number] DOB: 25-Apr-1950 Today's Date: 04/16/2014    History of Present Illness Pt admitted s/p R THA via direct anterior approach.    Clinical Impression   Pt was independent prior to admission.  Presents with mild pain and R hip limitations.  Pt is performing self care at a modified independent level and ADL transfers at a supervision level.  All equipment needs are met.  No further OT needs.    Follow Up Recommendations  No OT follow up    Equipment Recommendations  None recommended by OT    Recommendations for Other Services       Precautions / Restrictions Precautions Precautions: None Restrictions Weight Bearing Restrictions: Yes RLE Weight Bearing: Weight bearing as tolerated      Mobility Bed Mobility Overal bed mobility: Modified Independent                Transfers Overall transfer level: Modified independent Equipment used: Rolling walker (2 wheeled) Transfers: Sit to/from Stand Sit to Stand: Modified independent (Device/Increase time)         General transfer comment: cues to stand momentarily before ambulating, good technique    Balance                                            ADL Overall ADL's : Needs assistance/impaired Eating/Feeding: Independent   Grooming: Oral care;Standing;Modified independent   Upper Body Bathing: Set up;Sitting   Lower Body Bathing: Modified independent;Sit to/from stand;With adaptive equipment   Upper Body Dressing : Set up;Sitting   Lower Body Dressing: Modified independent;With adaptive equipment;Sit to/from stand   Toilet Transfer: Supervision/safety;Ambulation;BSC (over toilet)   Toileting- Clothing Manipulation and Hygiene: Modified independent;Sit to/from stand   Tub/ Shower Transfer: Min guard;Ambulation;Shower seat;Rolling walker;Tub transfer           Vision                      Perception     Praxis      Pertinent Vitals/Pain Pain Assessment: 0-10 Pain Score: 3  Pain Location: R hip Pain Descriptors / Indicators: Operative site guarding Pain Intervention(s): Monitored during session;Premedicated before session;Ice applied;Repositioned     Hand Dominance Right   Extremity/Trunk Assessment Upper Extremity Assessment Upper Extremity Assessment: Overall WFL for tasks assessed   Lower Extremity Assessment Lower Extremity Assessment: Defer to PT evaluation   Cervical / Trunk Assessment Cervical / Trunk Assessment: Normal   Communication Communication Communication: No difficulties   Cognition Arousal/Alertness: Awake/alert Behavior During Therapy: WFL for tasks assessed/performed Overall Cognitive Status: Within Functional Limits for tasks assessed                     General Comments       Exercises       Shoulder Instructions      Home Living Family/patient expects to be discharged to:: Private residence Living Arrangements: Spouse/significant other Available Help at Discharge: Family;Available 24 hours/day Type of Home: House Home Access: Stairs to enter [employer] of Steps: 3 Entrance Stairs-Rails: Right Home Layout: Two level;Able to live on main level with bedroom/bathroom     Bathroom Shower/Tub: Teacher, early years/pre: Standard     Home Equipment: Environmental consultant - 2 wheels;Shower seat;Grab bars - toilet;Transport chair;Adaptive equipment Adaptive Equipment: Reacher;Sock aid;Long-handled shoe  horn        Prior Functioning/Environment Level of Independence: Independent             OT Diagnosis:     OT Problem List:     OT Treatment/Interventions:      OT Goals(Current goals can be found in the care plan section) Acute Rehab OT Goals Patient Stated Goal: to return home  OT Frequency:     Barriers to D/C:            Co-evaluation              End of Session Equipment Utilized  During Treatment: Rolling walker  Activity Tolerance: Patient tolerated treatment well Patient left: in chair;with call bell/phone within reach   Time: 0952-1025 OT Time Calculation (min): 33 min Charges:  OT General Charges $OT Visit: 1 Procedure OT Evaluation $Initial OT Evaluation Tier I: 1 Procedure OT Treatments $Self Care/Home Management : 8-22 mins G-Codes:    [place], [place] 04/16/2014, 10:31 AM  [phone]

## 2014-04-16 NOTE — Progress Notes (Signed)
Physical Therapy Treatment Patient Details Name: Vanessa Harmon MRN: [id number] DOB: Jul 21, 1950 Today's Date: 04/16/2014    History of Present Illness Pt admitted s/p R THA via direct anterior approach.     PT Comments    Pt progressing well. Given handouts for stair management technique and HEP to increase carryover. Will plan to see for additional session with husband present to educate on technique for stair management to enter house.   Follow Up Recommendations  Home health PT;Outpatient PT     Equipment Recommendations  None recommended by PT    Recommendations for Other Services       Precautions / Restrictions Precautions Precautions: None Restrictions Weight Bearing Restrictions: Yes RLE Weight Bearing: Weight bearing as tolerated    Mobility  Bed Mobility Overal bed mobility: Modified Independent             General bed mobility comments: pt up in chair  Transfers Overall transfer level: Modified independent Equipment used: Rolling walker (2 wheeled) Transfers: Sit to/from Stand Sit to Stand: Modified independent (Device/Increase time)         General transfer comment: demo good technique   Ambulation/Gait Ambulation/Gait assistance: Supervision Ambulation Distance (Feet): 400 Feet (200' x 2) Assistive device: Rolling walker (2 wheeled) Gait Pattern/deviations: Decreased stride length;Decreased step length - left;Decreased stance time - right;Narrow base of support;Antalgic Gait velocity: decreased Gait velocity interpretation: Below normal speed for age/gender General Gait Details: initially amb with min antalgic gt but then progressed to more step through gt pattern; multimodal cues for proper pattern   Stairs Stairs: Yes Stairs assistance: Min guard Stair Management: One rail Right;No rails;Step to pattern;Forwards;Backwards;With walker Number of Stairs: 4 General stair comments: practiced steps with multiple different technqiues; will  plan to see again for family education on stair management; pt given 2 handouts for carry over  Wheelchair Mobility    Modified Rankin (Stroke Patients Only)       Balance Overall balance assessment: No apparent balance deficits (not formally assessed)                                  Cognition Arousal/Alertness: Awake/alert Behavior During Therapy: WFL for tasks assessed/performed Overall Cognitive Status: Within Functional Limits for tasks assessed                      Exercises Total Joint Exercises Ankle Circles/Pumps: AROM;Both;10 reps;Seated Quad Sets: AROM;Right;10 reps;Seated Short Arc Quad: AROM;Strengthening;Right;10 reps;Other (comment) (hold 5 sec) Heel Slides: AAROM;Right;10 reps;Seated Hip ABduction/ADduction: AAROM;Right;10 reps;Seated Straight Leg Raises: AROM;Right;10 reps;Seated Long Arc Quad: AROM;Strengthening;Right;10 reps;Seated    General Comments General comments (skin integrity, edema, etc.): reviewed car transfer technique ; given HEP handout      Pertinent Vitals/Pain Pain Assessment: 0-10 Pain Score: 5  Pain Location: Rt hip Pain Descriptors / Indicators: Tightness;Sore;Spasm Pain Intervention(s): Monitored during session;Premedicated before session;Repositioned;Ice applied    Home Living Family/patient expects to be discharged to:: Private residence Living Arrangements: Spouse/significant other Available Help at Discharge: Family;Available 24 hours/day Type of Home: House Home Access: Stairs to enter Entrance Stairs-Rails: Right Home Layout: Two level;Able to live on main level with bedroom/bathroom Home Equipment: Gilford Rile - 2 wheels;Shower seat;Grab bars - toilet;Transport chair;Adaptive equipment      Prior Function Level of Independence: Independent          PT Goals (current goals can now be found in the care plan section) Acute  Rehab PT Goals Patient Stated Goal: to go home today PT Goal Formulation: With  patient/family Time For Goal Achievement: 04/17/14 Potential to Achieve Goals: Good Progress towards PT goals: Progressing toward goals    Frequency  7X/week    PT Plan Current plan remains appropriate    Co-evaluation             End of Session Equipment Utilized During Treatment: Gait belt Activity Tolerance: Patient tolerated treatment well Patient left: in chair;with call bell/phone within reach     Time: 1037-1101 PT Time Calculation (min) (ACUTE ONLY): 24 min  Charges:  $Gait Training: 8-22 mins $Therapeutic Exercise: 8-22 mins                    G CodesGustavus Bryant, [place]  [phone] 04/16/2014, 1:15 PM

## 2014-04-16 NOTE — Progress Notes (Signed)
CARE MANAGEMENT NOTE 04/16/2014  Patient:  Vanessa Harmon, Vanessa Harmon   Account Number:  [id number]  Date Initiated:  04/16/2014  Documentation initiated by:  [facility]  Subjective/Objective Assessment:   s/p rt THA     Action/Plan:   PT/OT evals-recommended HHPT   Anticipated DC Date:  04/16/2014   Anticipated DC Plan:  [place]  CM consult      [facility] Choice  HOME HEALTH   Choice offered to / List presented to:  C-1 Patient        [place] arranged  HH-2 PT      [place]   Status of service:  Completed, signed off Medicare Important Message given?   (If response is "NO", the following Medicare IM given date fields will be blank) Date Medicare IM given:   Medicare IM given by:   Date Additional Medicare IM given:   Additional Medicare IM given by:    Discharge Disposition:  [place]  Per UR Regulation:  Reviewed for med. necessity/level of care/duration of stay  If discussed at Long Length of Stay Meetings, dates discussed:    Comments:  04/16/14 Set up with [place] [facility] by MD office. Spoke with patient about HHC, no change in d/c plan. Patient states that she has rolling walker, does not want 3N1. No other d/c needs identified.Fuller Plan RN, BSN, CCM

## 2014-04-21 ENCOUNTER — Telehealth: Payer: Self-pay | Admitting: Internal Medicine

## 2014-04-21 DIAGNOSIS — Z471 Aftercare following joint replacement surgery: Secondary | ICD-10-CM | POA: Diagnosis not present

## 2014-04-21 DIAGNOSIS — I10 Essential (primary) hypertension: Secondary | ICD-10-CM | POA: Diagnosis not present

## 2014-04-21 DIAGNOSIS — M1612 Unilateral primary osteoarthritis, left hip: Secondary | ICD-10-CM | POA: Diagnosis not present

## 2014-04-21 DIAGNOSIS — F329 Major depressive disorder, single episode, unspecified: Secondary | ICD-10-CM | POA: Diagnosis not present

## 2014-04-21 DIAGNOSIS — M4186 Other forms of scoliosis, lumbar region: Secondary | ICD-10-CM | POA: Diagnosis not present

## 2014-04-21 DIAGNOSIS — Z7982 Long term (current) use of aspirin: Secondary | ICD-10-CM | POA: Diagnosis not present

## 2014-04-21 DIAGNOSIS — M47816 Spondylosis without myelopathy or radiculopathy, lumbar region: Secondary | ICD-10-CM | POA: Diagnosis not present

## 2014-04-21 DIAGNOSIS — Z96641 Presence of right artificial hip joint: Secondary | ICD-10-CM | POA: Diagnosis not present

## 2014-04-21 DIAGNOSIS — Z87891 Personal history of nicotine dependence: Secondary | ICD-10-CM | POA: Diagnosis not present

## 2014-04-21 NOTE — Telephone Encounter (Signed)
Patient contacted regarding request from [facility] about clarification of aspirin and indomethacin, aspirin and methotrexate, methotrexate and indomethacin. Patient has not changed her regimen in a number of years. She is on 325 mg of aspirin per orthopedist status post hip replacement for return back to 81 mg after a month of the 325 dose. She is doing extremely well. She is at home and receiving physical therapy at home.

## 2014-04-23 DIAGNOSIS — Z7982 Long term (current) use of aspirin: Secondary | ICD-10-CM | POA: Diagnosis not present

## 2014-04-23 DIAGNOSIS — M4186 Other forms of scoliosis, lumbar region: Secondary | ICD-10-CM | POA: Diagnosis not present

## 2014-04-23 DIAGNOSIS — F329 Major depressive disorder, single episode, unspecified: Secondary | ICD-10-CM | POA: Diagnosis not present

## 2014-04-23 DIAGNOSIS — M1612 Unilateral primary osteoarthritis, left hip: Secondary | ICD-10-CM | POA: Diagnosis not present

## 2014-04-23 DIAGNOSIS — Z87891 Personal history of nicotine dependence: Secondary | ICD-10-CM | POA: Diagnosis not present

## 2014-04-23 DIAGNOSIS — I10 Essential (primary) hypertension: Secondary | ICD-10-CM | POA: Diagnosis not present

## 2014-04-23 DIAGNOSIS — M47816 Spondylosis without myelopathy or radiculopathy, lumbar region: Secondary | ICD-10-CM | POA: Diagnosis not present

## 2014-04-23 DIAGNOSIS — Z96641 Presence of right artificial hip joint: Secondary | ICD-10-CM | POA: Diagnosis not present

## 2014-04-23 DIAGNOSIS — Z471 Aftercare following joint replacement surgery: Secondary | ICD-10-CM | POA: Diagnosis not present

## 2014-04-25 DIAGNOSIS — M4186 Other forms of scoliosis, lumbar region: Secondary | ICD-10-CM | POA: Diagnosis not present

## 2014-04-25 DIAGNOSIS — M47816 Spondylosis without myelopathy or radiculopathy, lumbar region: Secondary | ICD-10-CM | POA: Diagnosis not present

## 2014-04-25 DIAGNOSIS — Z87891 Personal history of nicotine dependence: Secondary | ICD-10-CM | POA: Diagnosis not present

## 2014-04-25 DIAGNOSIS — F329 Major depressive disorder, single episode, unspecified: Secondary | ICD-10-CM | POA: Diagnosis not present

## 2014-04-25 DIAGNOSIS — Z471 Aftercare following joint replacement surgery: Secondary | ICD-10-CM | POA: Diagnosis not present

## 2014-04-25 DIAGNOSIS — Z7982 Long term (current) use of aspirin: Secondary | ICD-10-CM | POA: Diagnosis not present

## 2014-04-25 DIAGNOSIS — M1612 Unilateral primary osteoarthritis, left hip: Secondary | ICD-10-CM | POA: Diagnosis not present

## 2014-04-25 DIAGNOSIS — Z96641 Presence of right artificial hip joint: Secondary | ICD-10-CM | POA: Diagnosis not present

## 2014-04-25 DIAGNOSIS — I10 Essential (primary) hypertension: Secondary | ICD-10-CM | POA: Diagnosis not present

## 2014-04-28 DIAGNOSIS — Z96641 Presence of right artificial hip joint: Secondary | ICD-10-CM | POA: Diagnosis not present

## 2014-04-28 DIAGNOSIS — F329 Major depressive disorder, single episode, unspecified: Secondary | ICD-10-CM | POA: Diagnosis not present

## 2014-04-28 DIAGNOSIS — M4186 Other forms of scoliosis, lumbar region: Secondary | ICD-10-CM | POA: Diagnosis not present

## 2014-04-28 DIAGNOSIS — Z87891 Personal history of nicotine dependence: Secondary | ICD-10-CM | POA: Diagnosis not present

## 2014-04-28 DIAGNOSIS — M1612 Unilateral primary osteoarthritis, left hip: Secondary | ICD-10-CM | POA: Diagnosis not present

## 2014-04-28 DIAGNOSIS — Z7982 Long term (current) use of aspirin: Secondary | ICD-10-CM | POA: Diagnosis not present

## 2014-04-28 DIAGNOSIS — I10 Essential (primary) hypertension: Secondary | ICD-10-CM | POA: Diagnosis not present

## 2014-04-28 DIAGNOSIS — M47816 Spondylosis without myelopathy or radiculopathy, lumbar region: Secondary | ICD-10-CM | POA: Diagnosis not present

## 2014-04-28 DIAGNOSIS — Z471 Aftercare following joint replacement surgery: Secondary | ICD-10-CM | POA: Diagnosis not present

## 2014-04-30 DIAGNOSIS — M6281 Muscle weakness (generalized): Secondary | ICD-10-CM | POA: Diagnosis not present

## 2014-04-30 DIAGNOSIS — M25551 Pain in right hip: Secondary | ICD-10-CM | POA: Diagnosis not present

## 2014-04-30 DIAGNOSIS — R269 Unspecified abnormalities of gait and mobility: Secondary | ICD-10-CM | POA: Diagnosis not present

## 2014-05-05 DIAGNOSIS — M6281 Muscle weakness (generalized): Secondary | ICD-10-CM | POA: Diagnosis not present

## 2014-05-05 DIAGNOSIS — R269 Unspecified abnormalities of gait and mobility: Secondary | ICD-10-CM | POA: Diagnosis not present

## 2014-05-05 DIAGNOSIS — M25551 Pain in right hip: Secondary | ICD-10-CM | POA: Diagnosis not present

## 2014-05-07 DIAGNOSIS — M545 Low back pain: Secondary | ICD-10-CM | POA: Diagnosis not present

## 2014-05-07 DIAGNOSIS — M4135 Thoracogenic scoliosis, thoracolumbar region: Secondary | ICD-10-CM | POA: Diagnosis not present

## 2014-05-07 DIAGNOSIS — M4310 Spondylolisthesis, site unspecified: Secondary | ICD-10-CM | POA: Diagnosis not present

## 2014-05-07 DIAGNOSIS — M1611 Unilateral primary osteoarthritis, right hip: Secondary | ICD-10-CM | POA: Diagnosis not present

## 2014-05-08 DIAGNOSIS — R269 Unspecified abnormalities of gait and mobility: Secondary | ICD-10-CM | POA: Diagnosis not present

## 2014-05-08 DIAGNOSIS — M25551 Pain in right hip: Secondary | ICD-10-CM | POA: Diagnosis not present

## 2014-05-08 DIAGNOSIS — M6281 Muscle weakness (generalized): Secondary | ICD-10-CM | POA: Diagnosis not present

## 2014-05-12 ENCOUNTER — Other Ambulatory Visit: Payer: Self-pay | Admitting: Internal Medicine

## 2014-05-12 DIAGNOSIS — M6281 Muscle weakness (generalized): Secondary | ICD-10-CM | POA: Diagnosis not present

## 2014-05-12 DIAGNOSIS — M25551 Pain in right hip: Secondary | ICD-10-CM | POA: Diagnosis not present

## 2014-05-12 DIAGNOSIS — R269 Unspecified abnormalities of gait and mobility: Secondary | ICD-10-CM | POA: Diagnosis not present

## 2014-05-12 NOTE — Telephone Encounter (Signed)
Refills called in to pharmacy for lorazepam

## 2014-05-12 NOTE — Telephone Encounter (Signed)
Refill x 6 months 

## 2014-05-19 DIAGNOSIS — M150 Primary generalized (osteo)arthritis: Secondary | ICD-10-CM | POA: Diagnosis not present

## 2014-05-19 DIAGNOSIS — M79641 Pain in right hand: Secondary | ICD-10-CM | POA: Diagnosis not present

## 2014-05-19 DIAGNOSIS — M1611 Unilateral primary osteoarthritis, right hip: Secondary | ICD-10-CM | POA: Diagnosis not present

## 2014-05-19 DIAGNOSIS — M064 Inflammatory polyarthropathy: Secondary | ICD-10-CM | POA: Diagnosis not present

## 2014-05-19 DIAGNOSIS — M79642 Pain in left hand: Secondary | ICD-10-CM | POA: Diagnosis not present

## 2014-05-19 DIAGNOSIS — Z96641 Presence of right artificial hip joint: Secondary | ICD-10-CM | POA: Diagnosis not present

## 2014-06-09 ENCOUNTER — Other Ambulatory Visit: Payer: Medicare Other | Admitting: Internal Medicine

## 2014-06-09 DIAGNOSIS — E785 Hyperlipidemia, unspecified: Secondary | ICD-10-CM | POA: Diagnosis not present

## 2014-06-09 DIAGNOSIS — Z79899 Other long term (current) drug therapy: Secondary | ICD-10-CM | POA: Diagnosis not present

## 2014-06-09 LAB — HEPATIC FUNCTION PANEL
ALT: 54 U/L — ABNORMAL HIGH (ref 0–35)
AST: 34 U/L (ref 0–37)
Albumin: 4.1 g/dL (ref 3.5–5.2)
Alkaline Phosphatase: 72 U/L (ref 39–117)
Bilirubin, Direct: 0.1 mg/dL (ref 0.0–0.3)
Indirect Bilirubin: 0.4 mg/dL (ref 0.2–1.2)
Total Bilirubin: 0.5 mg/dL (ref 0.2–1.2)
Total Protein: 6.3 g/dL (ref 6.0–8.3)

## 2014-06-09 LAB — LIPID PANEL
Cholesterol: 166 mg/dL (ref 0–200)
HDL: 52 mg/dL (ref >=46)
LDL Cholesterol: 88 mg/dL (ref 0–99)
Total CHOL/HDL Ratio: 3.2 Ratio
Triglycerides: 128 mg/dL (ref <150)
VLDL: 26 mg/dL (ref 0–40)

## 2014-06-10 ENCOUNTER — Encounter: Payer: Self-pay | Admitting: Internal Medicine

## 2014-06-10 ENCOUNTER — Ambulatory Visit (INDEPENDENT_AMBULATORY_CARE_PROVIDER_SITE_OTHER): Payer: Medicare Other | Admitting: Internal Medicine

## 2014-06-10 VITALS — BP 122/68 | HR 59 | Temp 97.6°F | Wt 158.0 lb

## 2014-06-10 DIAGNOSIS — E785 Hyperlipidemia, unspecified: Secondary | ICD-10-CM | POA: Diagnosis not present

## 2014-06-10 DIAGNOSIS — M159 Polyosteoarthritis, unspecified: Secondary | ICD-10-CM

## 2014-06-10 DIAGNOSIS — Z87898 Personal history of other specified conditions: Secondary | ICD-10-CM

## 2014-06-10 DIAGNOSIS — Z9660 Presence of unspecified orthopedic joint implant: Secondary | ICD-10-CM | POA: Diagnosis not present

## 2014-06-10 DIAGNOSIS — I10 Essential (primary) hypertension: Secondary | ICD-10-CM

## 2014-06-10 DIAGNOSIS — Z8639 Personal history of other endocrine, nutritional and metabolic disease: Secondary | ICD-10-CM | POA: Diagnosis not present

## 2014-06-10 DIAGNOSIS — M150 Primary generalized (osteo)arthritis: Secondary | ICD-10-CM

## 2014-06-10 DIAGNOSIS — Z8659 Personal history of other mental and behavioral disorders: Secondary | ICD-10-CM | POA: Diagnosis not present

## 2014-06-10 DIAGNOSIS — Z96641 Presence of right artificial hip joint: Secondary | ICD-10-CM

## 2014-06-10 DIAGNOSIS — M8949 Other hypertrophic osteoarthropathy, multiple sites: Secondary | ICD-10-CM

## 2014-06-11 ENCOUNTER — Encounter: Payer: Self-pay | Admitting: Internal Medicine

## 2014-06-11 NOTE — Patient Instructions (Signed)
Continue same medications and return for physical exam fall 2016

## 2014-06-11 NOTE — Progress Notes (Signed)
   Subjective:    Patient ID: Vanessa Harmon, female    DOB: [birth year], 64 y.o.   MRN: [id number]  HPI  64 year old White Female status post hip replacement in today for follow-up of medical issues. In late December she had total right hip arthroscopy with the anterior approach by Dr. Fredonia Highland. He's doing extremely well sometimes gets frustrated because she hasn't gained as much motion and momentum and she thought she would. She had home physical therapy. Did not like the therapist.  At time of physical exam in September 2015, her triglycerides were elevated at 173. These are normal now.    Review of Systems     Objective:   Physical Exam Neck is supple without JVD thyromegaly or carotid bruits. Chest clear. Cardiac exam regular rate and rhythm. Extremities without edema.       Assessment & Plan:  Status post right hip replacement December 2015-doing well  Hyperlipidemia-stable on current regimen with statin  Hypertension-stable on current regimen. History of bradycardia on beta blocker. Blood pressure stable.  Depression-has some mild depression due to recovering from surgery  Impaired glucose tolerance. Hemoglobin A1c in September was 5.3% and stable  Degenerative joint disease-currently taking indomethacin and methotrexate as well as hydrocodone/APAP  Plan: Return September 2016 for physical exam  25 minutes spent with patient discussing these medical issues

## 2014-06-18 DIAGNOSIS — M25551 Pain in right hip: Secondary | ICD-10-CM | POA: Diagnosis not present

## 2014-06-18 DIAGNOSIS — Z96641 Presence of right artificial hip joint: Secondary | ICD-10-CM | POA: Diagnosis not present

## 2014-07-30 ENCOUNTER — Other Ambulatory Visit: Payer: Self-pay | Admitting: Internal Medicine

## 2014-07-31 NOTE — Telephone Encounter (Signed)
flonase refilled.

## 2014-09-01 ENCOUNTER — Ambulatory Visit (INDEPENDENT_AMBULATORY_CARE_PROVIDER_SITE_OTHER): Payer: Medicare Other | Admitting: Internal Medicine

## 2014-09-01 ENCOUNTER — Encounter: Payer: Self-pay | Admitting: Internal Medicine

## 2014-09-01 VITALS — BP 118/70 | HR 58 | Temp 97.8°F | Wt 158.0 lb

## 2014-09-01 DIAGNOSIS — R040 Epistaxis: Secondary | ICD-10-CM

## 2014-09-01 MED ORDER — LORAZEPAM 1 MG PO TABS: ORAL_TABLET | ORAL | Status: DC

## 2014-09-01 MED ORDER — MUPIROCIN 2 % EX OINT: 1.0000 "application " | TOPICAL_OINTMENT | Freq: Two times a day (BID) | CUTANEOUS | Status: DC

## 2014-09-01 MED ORDER — METHYLPREDNISOLONE ACETATE 80 MG/ML IJ SUSP
80.0000 mg | Freq: Once | INTRAMUSCULAR | Status: AC
  Administered 2014-09-01: 80 mg via INTRAMUSCULAR

## 2014-09-01 NOTE — Patient Instructions (Addendum)
Use bactroban in nostril daily. Depomedrol given. Cut back on aspirin to once a week for a couple of weeks. Increase Ativan to 2 mg at bedtime.

## 2014-09-01 NOTE — Progress Notes (Signed)
   Subjective:    Patient ID: Vanessa Harmon, female    DOB: [birth year], 64 y.o.   MRN: [id number]  HPI  Patient had episodes epistaxis from right nostril 2 days ago. She is on indomethacin and aspirin 81 mg daily. These 2 medications could increased bleeding. Nosebleeds stopped spontaneously. She did not think to pinch her nostrils to control the bleeding. Says allergies have been flaring up and have been very bad with nasal congestion this Spring. Despite using Astelin and over-the-counter antihistamine symptoms have not been controlled.  Another issue today is insomnia not responding to Ativan 1 mg at bedtime. Long-standing history of insomnia. 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    Review of Systems     Objective:   Physical Exam  Evidence of fresh hemorrhage right medial nostril. Both nostrils have boggy nasal mucosa.  Spoke with her about insomnia is well.      Assessment & Plan:  Nosebleeds-likely related to allergic rhinitis, indomethacin and aspirin therapy. Depo-Medrol 80 mg IM given for allergic rhinitis. Change aspirin to once weekly for several weeks instead of daily. Continue indomethacin. Bactroban ointment to use in nostrils at bedtime to keep mucosa moist  Insomnia-increase Ativan to 2 mg at bedtime. May continue to have prescription for 1 mg every morning if needed. Given #90 with one refill.  Allergic rhinitis-Depo-Medrol 80 mg IM given today.  25 minutes spent with patient today examining patient, history taking and discussing management these issues

## 2014-09-10 DIAGNOSIS — H43393 Other vitreous opacities, bilateral: Secondary | ICD-10-CM | POA: Diagnosis not present

## 2014-09-10 DIAGNOSIS — H43813 Vitreous degeneration, bilateral: Secondary | ICD-10-CM | POA: Diagnosis not present

## 2014-09-10 DIAGNOSIS — Z961 Presence of intraocular lens: Secondary | ICD-10-CM | POA: Diagnosis not present

## 2014-09-17 DIAGNOSIS — M25551 Pain in right hip: Secondary | ICD-10-CM | POA: Diagnosis not present

## 2014-09-18 DIAGNOSIS — M064 Inflammatory polyarthropathy: Secondary | ICD-10-CM | POA: Diagnosis not present

## 2014-09-18 DIAGNOSIS — M25562 Pain in left knee: Secondary | ICD-10-CM | POA: Diagnosis not present

## 2014-09-18 DIAGNOSIS — M150 Primary generalized (osteo)arthritis: Secondary | ICD-10-CM | POA: Diagnosis not present

## 2014-09-18 DIAGNOSIS — M25652 Stiffness of left hip, not elsewhere classified: Secondary | ICD-10-CM | POA: Diagnosis not present

## 2014-09-18 DIAGNOSIS — M25651 Stiffness of right hip, not elsewhere classified: Secondary | ICD-10-CM | POA: Diagnosis not present

## 2014-09-18 DIAGNOSIS — M25551 Pain in right hip: Secondary | ICD-10-CM | POA: Diagnosis not present

## 2014-09-19 ENCOUNTER — Telehealth: Payer: Self-pay | Admitting: *Deleted

## 2014-09-19 ENCOUNTER — Other Ambulatory Visit: Payer: Self-pay | Admitting: Internal Medicine

## 2014-09-19 NOTE — Telephone Encounter (Signed)
Left message for patient to call back  

## 2014-09-19 NOTE — Telephone Encounter (Signed)
Patient states she saw Dr [name] yesterday and he hasnt taken her of Indocin.

## 2014-09-19 NOTE — Telephone Encounter (Signed)
Does Rheumatologist still want her to take this?

## 2014-09-22 DIAGNOSIS — M25651 Stiffness of right hip, not elsewhere classified: Secondary | ICD-10-CM | POA: Diagnosis not present

## 2014-09-22 DIAGNOSIS — M25652 Stiffness of left hip, not elsewhere classified: Secondary | ICD-10-CM | POA: Diagnosis not present

## 2014-09-22 DIAGNOSIS — M25551 Pain in right hip: Secondary | ICD-10-CM | POA: Diagnosis not present

## 2014-09-22 DIAGNOSIS — M25562 Pain in left knee: Secondary | ICD-10-CM | POA: Diagnosis not present

## 2014-09-23 DIAGNOSIS — M25652 Stiffness of left hip, not elsewhere classified: Secondary | ICD-10-CM | POA: Diagnosis not present

## 2014-09-23 DIAGNOSIS — M25651 Stiffness of right hip, not elsewhere classified: Secondary | ICD-10-CM | POA: Diagnosis not present

## 2014-09-23 DIAGNOSIS — M25562 Pain in left knee: Secondary | ICD-10-CM | POA: Diagnosis not present

## 2014-09-23 DIAGNOSIS — M25551 Pain in right hip: Secondary | ICD-10-CM | POA: Diagnosis not present

## 2014-09-25 DIAGNOSIS — M25562 Pain in left knee: Secondary | ICD-10-CM | POA: Diagnosis not present

## 2014-09-25 DIAGNOSIS — M25551 Pain in right hip: Secondary | ICD-10-CM | POA: Diagnosis not present

## 2014-09-25 DIAGNOSIS — M25651 Stiffness of right hip, not elsewhere classified: Secondary | ICD-10-CM | POA: Diagnosis not present

## 2014-09-25 DIAGNOSIS — M25652 Stiffness of left hip, not elsewhere classified: Secondary | ICD-10-CM | POA: Diagnosis not present

## 2014-09-29 DIAGNOSIS — M25562 Pain in left knee: Secondary | ICD-10-CM | POA: Diagnosis not present

## 2014-09-29 DIAGNOSIS — M25652 Stiffness of left hip, not elsewhere classified: Secondary | ICD-10-CM | POA: Diagnosis not present

## 2014-09-29 DIAGNOSIS — M25551 Pain in right hip: Secondary | ICD-10-CM | POA: Diagnosis not present

## 2014-09-29 DIAGNOSIS — M25651 Stiffness of right hip, not elsewhere classified: Secondary | ICD-10-CM | POA: Diagnosis not present

## 2014-09-30 DIAGNOSIS — M25652 Stiffness of left hip, not elsewhere classified: Secondary | ICD-10-CM | POA: Diagnosis not present

## 2014-09-30 DIAGNOSIS — M25551 Pain in right hip: Secondary | ICD-10-CM | POA: Diagnosis not present

## 2014-09-30 DIAGNOSIS — M25562 Pain in left knee: Secondary | ICD-10-CM | POA: Diagnosis not present

## 2014-09-30 DIAGNOSIS — M25651 Stiffness of right hip, not elsewhere classified: Secondary | ICD-10-CM | POA: Diagnosis not present

## 2014-10-02 DIAGNOSIS — M25651 Stiffness of right hip, not elsewhere classified: Secondary | ICD-10-CM | POA: Diagnosis not present

## 2014-10-02 DIAGNOSIS — M25562 Pain in left knee: Secondary | ICD-10-CM | POA: Diagnosis not present

## 2014-10-02 DIAGNOSIS — M25652 Stiffness of left hip, not elsewhere classified: Secondary | ICD-10-CM | POA: Diagnosis not present

## 2014-10-02 DIAGNOSIS — M25551 Pain in right hip: Secondary | ICD-10-CM | POA: Diagnosis not present

## 2014-10-13 ENCOUNTER — Ambulatory Visit (INDEPENDENT_AMBULATORY_CARE_PROVIDER_SITE_OTHER): Payer: Self-pay | Admitting: Neurology

## 2014-10-13 ENCOUNTER — Ambulatory Visit (INDEPENDENT_AMBULATORY_CARE_PROVIDER_SITE_OTHER): Payer: Medicare Other | Admitting: Neurology

## 2014-10-13 DIAGNOSIS — G5601 Carpal tunnel syndrome, right upper limb: Secondary | ICD-10-CM

## 2014-10-13 DIAGNOSIS — M25652 Stiffness of left hip, not elsewhere classified: Secondary | ICD-10-CM | POA: Diagnosis not present

## 2014-10-13 DIAGNOSIS — Z0289 Encounter for other administrative examinations: Secondary | ICD-10-CM

## 2014-10-13 DIAGNOSIS — M25562 Pain in left knee: Secondary | ICD-10-CM | POA: Diagnosis not present

## 2014-10-13 DIAGNOSIS — M25651 Stiffness of right hip, not elsewhere classified: Secondary | ICD-10-CM | POA: Diagnosis not present

## 2014-10-13 DIAGNOSIS — M25551 Pain in right hip: Secondary | ICD-10-CM | POA: Diagnosis not present

## 2014-10-13 NOTE — Progress Notes (Signed)
  [facility]    Provider:  Dr Jaynee Eagles Referring Provider: Elby Showers, MD Primary Care Physician:  Elby Showers, MD  HPI:  Vanessa Harmon is a 64 y.o. female here as a referral from Dr. Renold Genta for right hand numbness. The right thumb is numb, she drops objects, endorses weak grip, can get tingling in the hand if positioned wrong like when sleeping. Mostly the right hand, left hand is largely unaffected.Tries to shake out the right hand due to numbness. Right > left thenar eminence atrophy. No neck pain, no radicular symptoms.   Summary:   Nerve Conduction Studies were performed on the bilateral upper extremities.  The right median APB motor nerve showed prolonged distal onset latency (7 ms, N<4.0). The right Median 2nd Digit sensory nerve showed prolonged distal peak latency (5.0 ms, N<3.9). F Wave studies indicate that the right Median F wave has delayed latency(37.2, N<45ms).  The left median APB motor nerve showed prolonged distal onset latency (4.1 ms, N<4.0). The left Median 2nd Digit sensory nerve showed prolonged distal peak latency (4.4 ms, N<3.9).  F Wave studies indicate that the left Median F wave has normal latency.   Bilateral Ulnar ADM motor nerves were within normal limits. The bilateralUlnar 5th digit sensory nerves were within normal limits. F Wave studies indicate that the bilateral Ulnar F waves have normal  latencies   EMG needle study of selected right-sided muscles was performed: The Opponens Pollicis muscle showed increased spontaneous activity (positive sharp waves), increased amplitude, reduced recruitment and polyphasic potentials. The following right-sided muscles were normal: Deltoid, Triceps, Pronator Teres, First Dorsal Interosseous, C7 and C8 paraspinals.   Conclusion: This is an abnormal study. There is electrophysiologic evidence of severe right Carpal Tunnel Syndrome and moderately severe left carpal tunnel syndrome. No  suggestion of polyneuropathy or radiculopathy. Clinical correlation recommended.   Sarina Ill, MD  [facility] [address] [place], [place] [address]  Phone [phone] Fax [phone]

## 2014-10-13 NOTE — Procedures (Signed)
[facility]    Provider:  Dr Jaynee Eagles Referring Provider: Elby Showers, MD Primary Care Physician:  Elby Showers, MD  HPI:  Vanessa Harmon is a 64 y.o. female here as a referral from Dr. Renold Genta for right hand numbness. The right thumb is numb, she drops objects, endorses weak grip, can get tingling in the hand if positioned wrong like when sleeping. Mostly the right hand, left hand is largely unaffected.Tries to shake out the right hand due to numbness. Right > left thenar eminence atrophy. No neck pain, no radicular symptoms.   Summary:   Nerve Conduction Studies were performed on the bilateral upper extremities.  The right median APB motor nerve showed prolonged distal onset latency (7 ms, N<4.0). The right Median 2nd Digit sensory nerve showed prolonged distal peak latency (5.0 ms, N<3.9). F Wave studies indicate that the right Median F wave has delayed latency(37.2, N<55ms).  The left median APB motor nerve showed prolonged distal onset latency (4.1 ms, N<4.0). The left Median 2nd Digit sensory nerve showed prolonged distal peak latency (4.4 ms, N<3.9).  F Wave studies indicate that the left Median F wave has normal latency.   Bilateral Ulnar ADM motor nerves were within normal limits. The bilateralUlnar 5th digit sensory nerves were within normal limits. F Wave studies indicate that the bilateral Ulnar F waves have normal  latencies   EMG needle study of selected right-sided muscles was performed: The Opponens Pollicis muscle showed increased spontaneous activity (positive sharp waves), increased amplitude, reduced recruitment and polyphasic potentials. The following right-sided muscles were normal: Deltoid, Triceps, Pronator Teres, First Dorsal Interosseous, C7 and C8 paraspinals.   Conclusion: This is an abnormal study. There is electrophysiologic evidence of severe right Carpal Tunnel Syndrome and moderately-severe left carpal tunnel syndrome. No  suggestion of polyneuropathy or radiculopathy. Clinical correlation recommended.   Vanessa Ill, MD  [facility] [address] [place], [place] [address]  Phone [phone] Fax [phone]

## 2014-10-13 NOTE — Progress Notes (Signed)
See procedure note.

## 2014-10-14 DIAGNOSIS — M069 Rheumatoid arthritis, unspecified: Secondary | ICD-10-CM | POA: Diagnosis not present

## 2014-10-15 DIAGNOSIS — M25551 Pain in right hip: Secondary | ICD-10-CM | POA: Diagnosis not present

## 2014-10-15 DIAGNOSIS — M25652 Stiffness of left hip, not elsewhere classified: Secondary | ICD-10-CM | POA: Diagnosis not present

## 2014-10-15 DIAGNOSIS — M25651 Stiffness of right hip, not elsewhere classified: Secondary | ICD-10-CM | POA: Diagnosis not present

## 2014-10-15 DIAGNOSIS — M25562 Pain in left knee: Secondary | ICD-10-CM | POA: Diagnosis not present

## 2014-10-22 DIAGNOSIS — M25551 Pain in right hip: Secondary | ICD-10-CM | POA: Diagnosis not present

## 2014-10-22 DIAGNOSIS — M25651 Stiffness of right hip, not elsewhere classified: Secondary | ICD-10-CM | POA: Diagnosis not present

## 2014-10-22 DIAGNOSIS — M25652 Stiffness of left hip, not elsewhere classified: Secondary | ICD-10-CM | POA: Diagnosis not present

## 2014-10-22 DIAGNOSIS — M25562 Pain in left knee: Secondary | ICD-10-CM | POA: Diagnosis not present

## 2014-10-24 DIAGNOSIS — M25651 Stiffness of right hip, not elsewhere classified: Secondary | ICD-10-CM | POA: Diagnosis not present

## 2014-10-24 DIAGNOSIS — M25562 Pain in left knee: Secondary | ICD-10-CM | POA: Diagnosis not present

## 2014-10-24 DIAGNOSIS — M25551 Pain in right hip: Secondary | ICD-10-CM | POA: Diagnosis not present

## 2014-10-24 DIAGNOSIS — M25652 Stiffness of left hip, not elsewhere classified: Secondary | ICD-10-CM | POA: Diagnosis not present

## 2014-10-27 DIAGNOSIS — M25562 Pain in left knee: Secondary | ICD-10-CM | POA: Diagnosis not present

## 2014-10-27 DIAGNOSIS — M25651 Stiffness of right hip, not elsewhere classified: Secondary | ICD-10-CM | POA: Diagnosis not present

## 2014-10-27 DIAGNOSIS — M25652 Stiffness of left hip, not elsewhere classified: Secondary | ICD-10-CM | POA: Diagnosis not present

## 2014-10-27 DIAGNOSIS — M25551 Pain in right hip: Secondary | ICD-10-CM | POA: Diagnosis not present

## 2014-10-31 DIAGNOSIS — M25551 Pain in right hip: Secondary | ICD-10-CM | POA: Diagnosis not present

## 2014-10-31 DIAGNOSIS — M25651 Stiffness of right hip, not elsewhere classified: Secondary | ICD-10-CM | POA: Diagnosis not present

## 2014-10-31 DIAGNOSIS — M25652 Stiffness of left hip, not elsewhere classified: Secondary | ICD-10-CM | POA: Diagnosis not present

## 2014-10-31 DIAGNOSIS — M25562 Pain in left knee: Secondary | ICD-10-CM | POA: Diagnosis not present

## 2014-11-03 DIAGNOSIS — M25562 Pain in left knee: Secondary | ICD-10-CM | POA: Diagnosis not present

## 2014-11-03 DIAGNOSIS — M25551 Pain in right hip: Secondary | ICD-10-CM | POA: Diagnosis not present

## 2014-11-03 DIAGNOSIS — M25651 Stiffness of right hip, not elsewhere classified: Secondary | ICD-10-CM | POA: Diagnosis not present

## 2014-11-03 DIAGNOSIS — M25652 Stiffness of left hip, not elsewhere classified: Secondary | ICD-10-CM | POA: Diagnosis not present

## 2014-11-05 DIAGNOSIS — M25562 Pain in left knee: Secondary | ICD-10-CM | POA: Diagnosis not present

## 2014-11-05 DIAGNOSIS — M25551 Pain in right hip: Secondary | ICD-10-CM | POA: Diagnosis not present

## 2014-11-05 DIAGNOSIS — M25652 Stiffness of left hip, not elsewhere classified: Secondary | ICD-10-CM | POA: Diagnosis not present

## 2014-11-05 DIAGNOSIS — M25651 Stiffness of right hip, not elsewhere classified: Secondary | ICD-10-CM | POA: Diagnosis not present

## 2014-11-10 DIAGNOSIS — M25652 Stiffness of left hip, not elsewhere classified: Secondary | ICD-10-CM | POA: Diagnosis not present

## 2014-11-10 DIAGNOSIS — M25562 Pain in left knee: Secondary | ICD-10-CM | POA: Diagnosis not present

## 2014-11-10 DIAGNOSIS — M25551 Pain in right hip: Secondary | ICD-10-CM | POA: Diagnosis not present

## 2014-11-10 DIAGNOSIS — M25651 Stiffness of right hip, not elsewhere classified: Secondary | ICD-10-CM | POA: Diagnosis not present

## 2014-11-12 DIAGNOSIS — M25551 Pain in right hip: Secondary | ICD-10-CM | POA: Diagnosis not present

## 2014-11-12 DIAGNOSIS — M25651 Stiffness of right hip, not elsewhere classified: Secondary | ICD-10-CM | POA: Diagnosis not present

## 2014-11-12 DIAGNOSIS — M25562 Pain in left knee: Secondary | ICD-10-CM | POA: Diagnosis not present

## 2014-11-12 DIAGNOSIS — M25652 Stiffness of left hip, not elsewhere classified: Secondary | ICD-10-CM | POA: Diagnosis not present

## 2014-11-17 DIAGNOSIS — M25652 Stiffness of left hip, not elsewhere classified: Secondary | ICD-10-CM | POA: Diagnosis not present

## 2014-11-17 DIAGNOSIS — M25651 Stiffness of right hip, not elsewhere classified: Secondary | ICD-10-CM | POA: Diagnosis not present

## 2014-11-17 DIAGNOSIS — M25551 Pain in right hip: Secondary | ICD-10-CM | POA: Diagnosis not present

## 2014-11-17 DIAGNOSIS — M25562 Pain in left knee: Secondary | ICD-10-CM | POA: Diagnosis not present

## 2014-11-18 ENCOUNTER — Other Ambulatory Visit: Payer: Self-pay | Admitting: Internal Medicine

## 2014-11-27 ENCOUNTER — Other Ambulatory Visit: Payer: Self-pay | Admitting: Internal Medicine

## 2014-11-27 NOTE — Telephone Encounter (Signed)
Give #90 with no refill. Pt has CPE appt in October.

## 2014-12-01 DIAGNOSIS — M25562 Pain in left knee: Secondary | ICD-10-CM | POA: Diagnosis not present

## 2014-12-01 DIAGNOSIS — M25652 Stiffness of left hip, not elsewhere classified: Secondary | ICD-10-CM | POA: Diagnosis not present

## 2014-12-01 DIAGNOSIS — M25651 Stiffness of right hip, not elsewhere classified: Secondary | ICD-10-CM | POA: Diagnosis not present

## 2014-12-01 DIAGNOSIS — M25551 Pain in right hip: Secondary | ICD-10-CM | POA: Diagnosis not present

## 2014-12-03 DIAGNOSIS — M25551 Pain in right hip: Secondary | ICD-10-CM | POA: Diagnosis not present

## 2014-12-03 DIAGNOSIS — M25562 Pain in left knee: Secondary | ICD-10-CM | POA: Diagnosis not present

## 2014-12-03 DIAGNOSIS — M25651 Stiffness of right hip, not elsewhere classified: Secondary | ICD-10-CM | POA: Diagnosis not present

## 2014-12-03 DIAGNOSIS — M25652 Stiffness of left hip, not elsewhere classified: Secondary | ICD-10-CM | POA: Diagnosis not present

## 2014-12-10 ENCOUNTER — Other Ambulatory Visit: Payer: Self-pay | Admitting: Internal Medicine

## 2014-12-10 DIAGNOSIS — M25551 Pain in right hip: Secondary | ICD-10-CM | POA: Diagnosis not present

## 2014-12-10 DIAGNOSIS — M25562 Pain in left knee: Secondary | ICD-10-CM | POA: Diagnosis not present

## 2014-12-10 DIAGNOSIS — M25652 Stiffness of left hip, not elsewhere classified: Secondary | ICD-10-CM | POA: Diagnosis not present

## 2014-12-10 DIAGNOSIS — M25651 Stiffness of right hip, not elsewhere classified: Secondary | ICD-10-CM | POA: Diagnosis not present

## 2014-12-16 DIAGNOSIS — M4135 Thoracogenic scoliosis, thoracolumbar region: Secondary | ICD-10-CM | POA: Diagnosis not present

## 2014-12-16 DIAGNOSIS — M545 Low back pain: Secondary | ICD-10-CM | POA: Diagnosis not present

## 2014-12-16 DIAGNOSIS — M169 Osteoarthritis of hip, unspecified: Secondary | ICD-10-CM | POA: Diagnosis not present

## 2014-12-16 DIAGNOSIS — M4310 Spondylolisthesis, site unspecified: Secondary | ICD-10-CM | POA: Diagnosis not present

## 2014-12-17 DIAGNOSIS — M25651 Stiffness of right hip, not elsewhere classified: Secondary | ICD-10-CM | POA: Diagnosis not present

## 2014-12-17 DIAGNOSIS — M25551 Pain in right hip: Secondary | ICD-10-CM | POA: Diagnosis not present

## 2014-12-17 DIAGNOSIS — M25652 Stiffness of left hip, not elsewhere classified: Secondary | ICD-10-CM | POA: Diagnosis not present

## 2014-12-17 DIAGNOSIS — M25562 Pain in left knee: Secondary | ICD-10-CM | POA: Diagnosis not present

## 2014-12-20 ENCOUNTER — Other Ambulatory Visit: Payer: Self-pay | Admitting: Internal Medicine

## 2014-12-25 DIAGNOSIS — M5136 Other intervertebral disc degeneration, lumbar region: Secondary | ICD-10-CM | POA: Diagnosis not present

## 2014-12-25 DIAGNOSIS — M150 Primary generalized (osteo)arthritis: Secondary | ICD-10-CM | POA: Diagnosis not present

## 2014-12-25 DIAGNOSIS — M1990 Unspecified osteoarthritis, unspecified site: Secondary | ICD-10-CM | POA: Diagnosis not present

## 2014-12-25 DIAGNOSIS — R7989 Other specified abnormal findings of blood chemistry: Secondary | ICD-10-CM | POA: Diagnosis not present

## 2015-01-16 ENCOUNTER — Other Ambulatory Visit: Payer: Self-pay | Admitting: Internal Medicine

## 2015-01-16 NOTE — Telephone Encounter (Signed)
Refill Ativan x 6 months

## 2015-01-22 DIAGNOSIS — M1990 Unspecified osteoarthritis, unspecified site: Secondary | ICD-10-CM | POA: Diagnosis not present

## 2015-01-22 DIAGNOSIS — M5136 Other intervertebral disc degeneration, lumbar region: Secondary | ICD-10-CM | POA: Diagnosis not present

## 2015-01-22 DIAGNOSIS — R7989 Other specified abnormal findings of blood chemistry: Secondary | ICD-10-CM | POA: Diagnosis not present

## 2015-01-22 DIAGNOSIS — M150 Primary generalized (osteo)arthritis: Secondary | ICD-10-CM | POA: Diagnosis not present

## 2015-01-26 ENCOUNTER — Other Ambulatory Visit: Payer: Medicare Other | Admitting: Internal Medicine

## 2015-01-26 DIAGNOSIS — Z79899 Other long term (current) drug therapy: Secondary | ICD-10-CM

## 2015-01-26 DIAGNOSIS — R7309 Other abnormal glucose: Secondary | ICD-10-CM

## 2015-01-26 DIAGNOSIS — E785 Hyperlipidemia, unspecified: Secondary | ICD-10-CM | POA: Diagnosis not present

## 2015-01-26 DIAGNOSIS — R7302 Impaired glucose tolerance (oral): Secondary | ICD-10-CM | POA: Diagnosis not present

## 2015-01-26 DIAGNOSIS — E559 Vitamin D deficiency, unspecified: Secondary | ICD-10-CM | POA: Diagnosis not present

## 2015-01-26 DIAGNOSIS — R5383 Other fatigue: Secondary | ICD-10-CM | POA: Diagnosis not present

## 2015-01-26 LAB — CBC WITH DIFFERENTIAL/PLATELET
Basophils Absolute: 0 10*3/uL (ref 0.0–0.1)
Basophils Relative: 0 % (ref 0–1)
Eosinophils Absolute: 0.1 10*3/uL (ref 0.0–0.7)
Eosinophils Relative: 1 % (ref 0–5)
HCT: 38.3 % (ref 36.0–46.0)
Hemoglobin: 13.2 g/dL (ref 12.0–15.0)
Lymphocytes Relative: 10 % — ABNORMAL LOW (ref 12–46)
Lymphs Abs: 0.9 10*3/uL (ref 0.7–4.0)
MCH: 32.8 pg (ref 26.0–34.0)
MCHC: 34.5 g/dL (ref 30.0–36.0)
MCV: 95 fL (ref 78.0–100.0)
MPV: 11.3 fL (ref 8.6–12.4)
Monocytes Absolute: 0.4 10*3/uL (ref 0.1–1.0)
Monocytes Relative: 5 % (ref 3–12)
Neutro Abs: 7.4 10*3/uL (ref 1.7–7.7)
Neutrophils Relative %: 84 % — ABNORMAL HIGH (ref 43–77)
Platelets: 247 10*3/uL (ref 150–400)
RBC: 4.03 MIL/uL (ref 3.87–5.11)
RDW: 14.7 % (ref 11.5–15.5)
WBC: 8.8 10*3/uL (ref 4.0–10.5)

## 2015-01-26 LAB — COMPLETE METABOLIC PANEL WITH GFR
ALT: 29 U/L (ref 6–29)
AST: 19 U/L (ref 10–35)
Albumin: 4.2 g/dL (ref 3.6–5.1)
Alkaline Phosphatase: 62 U/L (ref 33–130)
BUN: 17 mg/dL (ref 7–25)
CO2: 28 mmol/L (ref 20–31)
Calcium: 9.5 mg/dL (ref 8.6–10.4)
Chloride: 102 mmol/L (ref 98–110)
Creat: 0.87 mg/dL (ref 0.50–0.99)
GFR, Est African American: 81 mL/min (ref >=60)
GFR, Est Non African American: 70 mL/min (ref >=60)
Glucose, Bld: 115 mg/dL — ABNORMAL HIGH (ref 65–99)
Potassium: 4.5 mmol/L (ref 3.5–5.3)
Sodium: 138 mmol/L (ref 135–146)
Total Bilirubin: 0.6 mg/dL (ref 0.2–1.2)
Total Protein: 6.2 g/dL (ref 6.1–8.1)

## 2015-01-26 LAB — HEMOGLOBIN A1C
Hgb A1c MFr Bld: 5.9 % — ABNORMAL HIGH (ref <5.7)
Mean Plasma Glucose: 123 mg/dL — ABNORMAL HIGH (ref <117)

## 2015-01-26 LAB — LIPID PANEL
Cholesterol: 188 mg/dL (ref 125–200)
HDL: 58 mg/dL (ref >=46)
LDL Cholesterol: 111 mg/dL (ref <130)
Total CHOL/HDL Ratio: 3.2 Ratio (ref <=5.0)
Triglycerides: 97 mg/dL (ref <150)
VLDL: 19 mg/dL (ref <30)

## 2015-01-27 ENCOUNTER — Ambulatory Visit (INDEPENDENT_AMBULATORY_CARE_PROVIDER_SITE_OTHER): Payer: Medicare Other | Admitting: Internal Medicine

## 2015-01-27 ENCOUNTER — Encounter: Payer: Self-pay | Admitting: Internal Medicine

## 2015-01-27 VITALS — BP 116/72 | HR 64 | Temp 98.0°F | Ht 67.0 in | Wt 167.0 lb

## 2015-01-27 DIAGNOSIS — Z86010 Personal history of colonic polyps: Secondary | ICD-10-CM | POA: Diagnosis not present

## 2015-01-27 DIAGNOSIS — Z23 Encounter for immunization: Secondary | ICD-10-CM | POA: Diagnosis not present

## 2015-01-27 DIAGNOSIS — K219 Gastro-esophageal reflux disease without esophagitis: Secondary | ICD-10-CM

## 2015-01-27 DIAGNOSIS — E785 Hyperlipidemia, unspecified: Secondary | ICD-10-CM

## 2015-01-27 DIAGNOSIS — M791 Myalgia: Secondary | ICD-10-CM | POA: Diagnosis not present

## 2015-01-27 DIAGNOSIS — F341 Dysthymic disorder: Secondary | ICD-10-CM

## 2015-01-27 DIAGNOSIS — I10 Essential (primary) hypertension: Secondary | ICD-10-CM

## 2015-01-27 DIAGNOSIS — J209 Acute bronchitis, unspecified: Secondary | ICD-10-CM | POA: Diagnosis not present

## 2015-01-27 DIAGNOSIS — Z0000 Encounter for general adult medical examination without abnormal findings: Secondary | ICD-10-CM | POA: Diagnosis not present

## 2015-01-27 DIAGNOSIS — R7302 Impaired glucose tolerance (oral): Secondary | ICD-10-CM | POA: Diagnosis not present

## 2015-01-27 DIAGNOSIS — M353 Polymyalgia rheumatica: Secondary | ICD-10-CM

## 2015-01-27 DIAGNOSIS — M7918 Myalgia, other site: Secondary | ICD-10-CM

## 2015-01-27 LAB — POCT URINALYSIS DIPSTICK
Bilirubin, UA: NEGATIVE
Blood, UA: NEGATIVE
Glucose, UA: NEGATIVE
Ketones, UA: NEGATIVE
Leukocytes, UA: NEGATIVE
Nitrite, UA: NEGATIVE
Protein, UA: NEGATIVE
Spec Grav, UA: 1.01
Urobilinogen, UA: NEGATIVE
pH, UA: 6

## 2015-01-27 LAB — TSH: TSH: 0.902 u[IU]/mL (ref 0.350–4.500)

## 2015-01-27 LAB — VITAMIN D 25 HYDROXY (VIT D DEFICIENCY, FRACTURES): Vit D, 25-Hydroxy: 44 ng/mL (ref 30–100)

## 2015-01-27 MED ORDER — ALBUTEROL SULFATE HFA 108 (90 BASE) MCG/ACT IN AERS: 2.0000 | INHALATION_SPRAY | Freq: Four times a day (QID) | RESPIRATORY_TRACT | Status: DC | PRN

## 2015-01-27 MED ORDER — LEVOFLOXACIN 500 MG PO TABS: 500.0000 mg | ORAL_TABLET | Freq: Every day | ORAL | Status: DC

## 2015-01-27 NOTE — Progress Notes (Signed)
Subjective:    Patient ID: Vanessa Harmon, female    DOB: [birth year], 64 y.o.   MRN: [id number]  HPI For health maintenance and evaluation of medical issues. Seeing Dr. Amil Amen for presumed PMR now on Prednisone 9 mg daily tapering down to 5 mg daily. Also has Indocin and is taking that also. Dr. Amil Amen wants her to stop the Indocin but pt feels it helps. Husband having some health issues. History of impaired glucose tolerance likely related to prednisone therapy. History of dysthymia, essential hypertension, hyperlipidemia, adenomatous colon polyps, GE reflux, paroxysmal supraventricular tachycardia, back pain.  Have been many medications for hypertension she could not tolerate including  Altace, Cardura, Norvasc.  History of allergic rhinitis. Had scoliosis surgery as a teenager. Surgery in 2012 by Dr. Luiz Ochoa for spinal stenosis and spondylosis.  Had colonoscopy by Dr. Cristina Gong  in 2005.  Fractured right wrist 2009, right shoulder surgery for labral tear June 2006, right knee arthroscopy by Dr. Noemi Chapel 2006. Left knee arthroscopy in [place] in the mid 1990s.  Left breast biopsy 1996 or 1997. Blepharoplasty in early 1990s. Remote history of fractured sacrum after falling in the late 1980s. Exploratory laparoscopy and appendectomy in 1973. Tonsillectomy at age 42. Morton's neuroma removed from right foot in junior high school.  Social history: She is married. Does not smoke. Rarely drinks alcohol. Her stepson operates the due to low office. She has no children of her own. Husband has recovered from a fall but is having some other medical issues recently. Has shown an bred dogs for many years.  Family history: Father died at age 15 with hypertension, stroke, peptic ulcer disease and prostate cancer. Mother with history of stroke, atrial fibrillation, hypertension, valve replacement. Mother with history of rheumatic fever. One brother and one sister in good health.     Review of Systems    Constitutional: Positive for fatigue.  HENT: Negative.   Respiratory:       Recent acute URI with cough and congestion  Cardiovascular: Negative.   Genitourinary: Negative.   Psychiatric/Behavioral:       Dysthymia       Objective:   Physical Exam  Constitutional: She appears well-developed and well-nourished. No distress.  HENT:  Head: Normocephalic and atraumatic.  Right Ear: External ear normal.  Left Ear: External ear normal.  Mouth/Throat: Oropharynx is clear and moist. No oropharyngeal exudate.  Eyes: Conjunctivae are normal. Pupils are equal, round, and reactive to light. Right eye exhibits no discharge. Left eye exhibits no discharge. No scleral icterus.  Neck: Neck supple. No JVD present. No thyromegaly present.  Cardiovascular: Normal rate, regular rhythm, normal heart sounds and intact distal pulses.   No murmur heard. Pulmonary/Chest: Effort normal and breath sounds normal. No respiratory distress. She has no wheezes. She has no rales. She exhibits no tenderness.  Breasts normal female  Abdominal: Soft. Bowel sounds are normal.  Genitourinary:  Pap taken 2014. Bimanual normal.  Musculoskeletal: She exhibits no edema.  Lymphadenopathy:    She has no cervical adenopathy.  Skin: Skin is warm and dry. No rash noted. She is not diaphoretic.  Psychiatric: Her behavior is normal. Judgment and thought content normal.  A bit sad and dysthymic  Vitals reviewed.         Assessment & Plan:  Impaired glucose tolerance due to prednisone Dysthymia Essential HTN Hyperlipidemia Polymyalgia rheumatica treated by rheumatologist with low-dose prednisone History of adenomatous colon polyps History of paroxysmal supraventricular tachycardia. Was diagnosed with bradycardia before back  surgery 2015. Beta blocker was discontinued and she felt better. History of depression GE reflux Acute URI-treat with Levaquin  Plan: Continue same medications and return in 6 months.  Recommend annual mammogram.      Plan: Repeat AIC in 6 months with OV

## 2015-01-27 NOTE — Patient Instructions (Addendum)
Try levaquin for 10 days and inhaler as needed. RTC 6 months for OV and AIC. Continue same medications.

## 2015-02-02 DIAGNOSIS — M79641 Pain in right hand: Secondary | ICD-10-CM | POA: Diagnosis not present

## 2015-02-02 DIAGNOSIS — M65312 Trigger thumb, left thumb: Secondary | ICD-10-CM | POA: Diagnosis not present

## 2015-02-02 DIAGNOSIS — G5603 Carpal tunnel syndrome, bilateral upper limbs: Secondary | ICD-10-CM | POA: Diagnosis not present

## 2015-02-02 DIAGNOSIS — M79642 Pain in left hand: Secondary | ICD-10-CM | POA: Diagnosis not present

## 2015-02-02 DIAGNOSIS — G5601 Carpal tunnel syndrome, right upper limb: Secondary | ICD-10-CM | POA: Diagnosis not present

## 2015-02-06 DIAGNOSIS — Z1231 Encounter for screening mammogram for malignant neoplasm of breast: Secondary | ICD-10-CM | POA: Diagnosis not present

## 2015-02-15 DIAGNOSIS — M353 Polymyalgia rheumatica: Secondary | ICD-10-CM | POA: Insufficient documentation

## 2015-02-15 DIAGNOSIS — Z8739 Personal history of other diseases of the musculoskeletal system and connective tissue: Secondary | ICD-10-CM | POA: Insufficient documentation

## 2015-02-16 ENCOUNTER — Telehealth: Payer: Self-pay

## 2015-02-16 NOTE — Telephone Encounter (Signed)
Called patient to schedule Medicare EKG.

## 2015-02-23 ENCOUNTER — Ambulatory Visit (INDEPENDENT_AMBULATORY_CARE_PROVIDER_SITE_OTHER): Payer: Medicare Other | Admitting: Internal Medicine

## 2015-02-23 DIAGNOSIS — Z0000 Encounter for general adult medical examination without abnormal findings: Secondary | ICD-10-CM

## 2015-02-23 NOTE — Progress Notes (Signed)
Patient seen in clinic today for EKG to attach to DOS: 01/27/15 Welcome to [facility] Exam.

## 2015-03-09 DIAGNOSIS — G5603 Carpal tunnel syndrome, bilateral upper limbs: Secondary | ICD-10-CM | POA: Diagnosis not present

## 2015-03-09 DIAGNOSIS — M65312 Trigger thumb, left thumb: Secondary | ICD-10-CM | POA: Diagnosis not present

## 2015-03-09 DIAGNOSIS — M19041 Primary osteoarthritis, right hand: Secondary | ICD-10-CM | POA: Diagnosis not present

## 2015-03-11 ENCOUNTER — Other Ambulatory Visit: Payer: Self-pay | Admitting: Internal Medicine

## 2015-03-23 DIAGNOSIS — M1990 Unspecified osteoarthritis, unspecified site: Secondary | ICD-10-CM | POA: Diagnosis not present

## 2015-03-23 DIAGNOSIS — M5136 Other intervertebral disc degeneration, lumbar region: Secondary | ICD-10-CM | POA: Diagnosis not present

## 2015-03-23 DIAGNOSIS — Z7952 Long term (current) use of systemic steroids: Secondary | ICD-10-CM | POA: Diagnosis not present

## 2015-03-23 DIAGNOSIS — M150 Primary generalized (osteo)arthritis: Secondary | ICD-10-CM | POA: Diagnosis not present

## 2015-03-23 DIAGNOSIS — R7989 Other specified abnormal findings of blood chemistry: Secondary | ICD-10-CM | POA: Diagnosis not present

## 2015-04-23 ENCOUNTER — Other Ambulatory Visit: Payer: Self-pay | Admitting: Internal Medicine

## 2015-04-23 ENCOUNTER — Ambulatory Visit (INDEPENDENT_AMBULATORY_CARE_PROVIDER_SITE_OTHER): Payer: Medicare Other | Admitting: Internal Medicine

## 2015-04-23 ENCOUNTER — Encounter: Payer: Self-pay | Admitting: Internal Medicine

## 2015-04-23 VITALS — BP 126/74 | HR 67 | Temp 97.3°F | Resp 20 | Ht 67.0 in | Wt 176.0 lb

## 2015-04-23 DIAGNOSIS — M353 Polymyalgia rheumatica: Secondary | ICD-10-CM

## 2015-04-23 DIAGNOSIS — J069 Acute upper respiratory infection, unspecified: Secondary | ICD-10-CM

## 2015-04-23 DIAGNOSIS — G5601 Carpal tunnel syndrome, right upper limb: Secondary | ICD-10-CM

## 2015-04-23 MED ORDER — CLARITHROMYCIN 500 MG PO TABS: 500.0000 mg | ORAL_TABLET | Freq: Two times a day (BID) | ORAL | Status: DC

## 2015-04-23 NOTE — Patient Instructions (Addendum)
Take Biaxin 500 mg twice daily x 10 days. Do not take Lipitor while taking Biaxin. Call if not better in 10 days.

## 2015-04-23 NOTE — Progress Notes (Signed)
   Subjective:    Patient ID: Vanessa Harmon, female    DOB: [birth year], 65 y.o.   MRN: [id number]  HPI Patient was here in October for physical examination but also had  respiratory infection. Was treated with Levaquin. Says she never got completely better and husband subsequently gave her a cold. Has been coughing ever sent. Cough is somewhat productive. No fever or chills. Tired of coughing. Has to have right carpal tunnel surgery next week so she is anxious to get better.  Dr. Amedeo Plenty is doing the surgery.    Review of Systems     Objective:   Physical Exam Skin warm and dry. Nodes none. TMs are clear. Pharynx is clear. Neck is supple without adenopathy. Chest is clear without rales or wheezing.       Assessment & Plan:  Protracted URI  Plan: Offered six-day prednisone dosepak  but she declined. She is already on 7 mg of prednisone daily for polymyalgia rheumatica. Previously was on 9 mg daily in October. Prescribed Biaxin 500 mg twice daily for 10 days. Call if not better in 2 weeks. Do not take Lipitor while taking Biaxin.

## 2015-04-28 DIAGNOSIS — G5601 Carpal tunnel syndrome, right upper limb: Secondary | ICD-10-CM | POA: Diagnosis not present

## 2015-05-12 DIAGNOSIS — M79641 Pain in right hand: Secondary | ICD-10-CM | POA: Diagnosis not present

## 2015-05-25 DIAGNOSIS — M79641 Pain in right hand: Secondary | ICD-10-CM | POA: Diagnosis not present

## 2015-06-10 DIAGNOSIS — H40013 Open angle with borderline findings, low risk, bilateral: Secondary | ICD-10-CM | POA: Diagnosis not present

## 2015-06-10 DIAGNOSIS — H43393 Other vitreous opacities, bilateral: Secondary | ICD-10-CM | POA: Diagnosis not present

## 2015-06-10 DIAGNOSIS — Z961 Presence of intraocular lens: Secondary | ICD-10-CM | POA: Diagnosis not present

## 2015-06-22 DIAGNOSIS — M150 Primary generalized (osteo)arthritis: Secondary | ICD-10-CM | POA: Diagnosis not present

## 2015-06-22 DIAGNOSIS — M5136 Other intervertebral disc degeneration, lumbar region: Secondary | ICD-10-CM | POA: Diagnosis not present

## 2015-06-22 DIAGNOSIS — R7989 Other specified abnormal findings of blood chemistry: Secondary | ICD-10-CM | POA: Diagnosis not present

## 2015-06-22 DIAGNOSIS — Z7952 Long term (current) use of systemic steroids: Secondary | ICD-10-CM | POA: Diagnosis not present

## 2015-06-22 DIAGNOSIS — M1990 Unspecified osteoarthritis, unspecified site: Secondary | ICD-10-CM | POA: Diagnosis not present

## 2015-07-03 DIAGNOSIS — H40013 Open angle with borderline findings, low risk, bilateral: Secondary | ICD-10-CM | POA: Diagnosis not present

## 2015-07-03 DIAGNOSIS — Z961 Presence of intraocular lens: Secondary | ICD-10-CM | POA: Diagnosis not present

## 2015-07-17 ENCOUNTER — Other Ambulatory Visit: Payer: Self-pay | Admitting: Internal Medicine

## 2015-07-20 ENCOUNTER — Other Ambulatory Visit: Payer: Self-pay | Admitting: Internal Medicine

## 2015-07-20 ENCOUNTER — Other Ambulatory Visit: Payer: Medicare Other | Admitting: Internal Medicine

## 2015-07-20 DIAGNOSIS — R7989 Other specified abnormal findings of blood chemistry: Secondary | ICD-10-CM | POA: Diagnosis not present

## 2015-07-20 DIAGNOSIS — E785 Hyperlipidemia, unspecified: Secondary | ICD-10-CM | POA: Diagnosis not present

## 2015-07-20 DIAGNOSIS — R7302 Impaired glucose tolerance (oral): Secondary | ICD-10-CM

## 2015-07-20 LAB — HEMOGLOBIN A1C
Hgb A1c MFr Bld: 5.8 % — ABNORMAL HIGH (ref <5.7)
Mean Plasma Glucose: 120 mg/dL

## 2015-07-21 ENCOUNTER — Ambulatory Visit (INDEPENDENT_AMBULATORY_CARE_PROVIDER_SITE_OTHER): Payer: Medicare Other | Admitting: Internal Medicine

## 2015-07-21 ENCOUNTER — Encounter: Payer: Self-pay | Admitting: Internal Medicine

## 2015-07-21 VITALS — BP 130/80 | HR 66 | Temp 97.2°F | Resp 20 | Ht 67.0 in | Wt 174.5 lb

## 2015-07-21 DIAGNOSIS — M353 Polymyalgia rheumatica: Secondary | ICD-10-CM | POA: Diagnosis not present

## 2015-07-21 DIAGNOSIS — F329 Major depressive disorder, single episode, unspecified: Secondary | ICD-10-CM

## 2015-07-21 DIAGNOSIS — I10 Essential (primary) hypertension: Secondary | ICD-10-CM | POA: Diagnosis not present

## 2015-07-21 DIAGNOSIS — E785 Hyperlipidemia, unspecified: Secondary | ICD-10-CM

## 2015-07-21 DIAGNOSIS — J0100 Acute maxillary sinusitis, unspecified: Secondary | ICD-10-CM

## 2015-07-21 DIAGNOSIS — M1990 Unspecified osteoarthritis, unspecified site: Secondary | ICD-10-CM

## 2015-07-21 DIAGNOSIS — R7302 Impaired glucose tolerance (oral): Secondary | ICD-10-CM | POA: Diagnosis not present

## 2015-07-21 DIAGNOSIS — F32A Depression, unspecified: Secondary | ICD-10-CM

## 2015-07-21 DIAGNOSIS — G4700 Insomnia, unspecified: Secondary | ICD-10-CM | POA: Diagnosis not present

## 2015-07-21 MED ORDER — CLARITHROMYCIN 500 MG PO TABS: 500.0000 mg | ORAL_TABLET | Freq: Two times a day (BID) | ORAL | Status: DC

## 2015-07-21 NOTE — Patient Instructions (Signed)
Take Biaxin 500 mg twice daily for 10 days. Continue same medications and return in 6 months.

## 2015-07-21 NOTE — Progress Notes (Signed)
   Subjective:    Patient ID: Vanessa Harmon, female    DOB: 09/24/1950, 65 y.o.   MRN: [id number]  HPI 65 year old White Female in today for six-month recheck. Hemoglobin A1c stable at 5.8%. History of impaired glucose tolerance. Lipid panel will be added if she was fasting yesterday. She has a sinus infection that she's had for 2 or 3 weeks. Has congested cough. Has a lot of sinus pressure and nasal congestion. Husband had respiratory infection as well. Blood pressure stable at 130/80. History of polymyalgia rheumatica and is tapering off of prednisone at 1 mg per month. Now down to 6 mg daily. Remains on Lexapro for chronic depression. History of insomnia treated with lorazepam. Is on Lipitor for hyperlipidemia.    Review of Systems     Objective:   Physical Exam Pharynx is clear. TMs are clear. She has a congested cough. Neck is supple without adenopathy thyromegaly or JVD. Chest clear to auscultation. Cardiac exam regular rate and rhythm normal S1 and S2. Extremities without edema. She has a thickened nail left foot. Asking about podiatrist.       Assessment & Plan:  Acute sinusitis-treat with Biaxin  Essential hypertension-stable on current regimen  Hyperlipidemia-lipid panel added  Polymyalgia rheumatica-treated by rheumatologist and now on 6 mg of prednisone daily  Inflammatory polyarthropathy treated with methotrexate  History of depression-stable on Lexapro  Insomnia-stable on Ativan  Impaired glucose tolerance-stable at 5.8%  Onychomycosis-refer to podiatrist  Plan: Return in 6 months for physical exam. Biaxin 500 mg twice daily for 10 days. Hemoglobin A1c is stable and glucose intolerance controlled with diet. Continue Lipitor.  25 minutes spent with patient

## 2015-07-22 LAB — LIPID PANEL
Cholesterol: 195 mg/dL (ref 125–200)
HDL: 56 mg/dL (ref >=46)
LDL Cholesterol: 112 mg/dL (ref <130)
Total CHOL/HDL Ratio: 3.5 Ratio (ref <=5.0)
Triglycerides: 133 mg/dL (ref <150)
VLDL: 27 mg/dL (ref <30)

## 2015-07-23 ENCOUNTER — Other Ambulatory Visit: Payer: Self-pay | Admitting: Internal Medicine

## 2015-07-23 ENCOUNTER — Other Ambulatory Visit: Payer: Self-pay

## 2015-07-23 MED ORDER — LORAZEPAM 1 MG PO TABS: ORAL_TABLET | ORAL | Status: DC

## 2015-07-31 ENCOUNTER — Other Ambulatory Visit: Payer: Self-pay | Admitting: Internal Medicine

## 2015-09-22 DIAGNOSIS — M5136 Other intervertebral disc degeneration, lumbar region: Secondary | ICD-10-CM | POA: Diagnosis not present

## 2015-09-22 DIAGNOSIS — Z7952 Long term (current) use of systemic steroids: Secondary | ICD-10-CM | POA: Diagnosis not present

## 2015-09-22 DIAGNOSIS — M150 Primary generalized (osteo)arthritis: Secondary | ICD-10-CM | POA: Diagnosis not present

## 2015-09-22 DIAGNOSIS — R7989 Other specified abnormal findings of blood chemistry: Secondary | ICD-10-CM | POA: Diagnosis not present

## 2015-09-22 DIAGNOSIS — M1990 Unspecified osteoarthritis, unspecified site: Secondary | ICD-10-CM | POA: Diagnosis not present

## 2015-10-19 ENCOUNTER — Other Ambulatory Visit: Payer: Self-pay | Admitting: Internal Medicine

## 2015-10-19 NOTE — Telephone Encounter (Signed)
Verbal order by Dr. Renold Genta; ok to refill Indocin SR 75mg  CR capsule; #30, 5 refills.  Spoke with Sam @ [employer]; [phone].

## 2015-11-13 ENCOUNTER — Telehealth: Payer: Self-pay | Admitting: Internal Medicine

## 2015-11-13 ENCOUNTER — Encounter: Payer: Self-pay | Admitting: Internal Medicine

## 2015-11-13 ENCOUNTER — Ambulatory Visit (INDEPENDENT_AMBULATORY_CARE_PROVIDER_SITE_OTHER): Payer: Medicare Other | Admitting: Internal Medicine

## 2015-11-13 VITALS — Temp 98.4°F

## 2015-11-13 DIAGNOSIS — F32A Depression, unspecified: Secondary | ICD-10-CM

## 2015-11-13 DIAGNOSIS — J0100 Acute maxillary sinusitis, unspecified: Secondary | ICD-10-CM | POA: Diagnosis not present

## 2015-11-13 DIAGNOSIS — F329 Major depressive disorder, single episode, unspecified: Secondary | ICD-10-CM | POA: Diagnosis not present

## 2015-11-13 MED ORDER — LEVOFLOXACIN 500 MG PO TABS: 500.0000 mg | ORAL_TABLET | Freq: Every day | ORAL | 0 refills | Status: DC

## 2015-11-13 MED ORDER — PREDNISONE 10 MG PO TABS: ORAL_TABLET | ORAL | 0 refills | Status: DC

## 2015-11-13 NOTE — Patient Instructions (Addendum)
Sterapred DS 10 mg 6 day dosepack. Levaquin 500 mg bid x 10 days. See allergist. Appt with ENT. Please call Vanessa Harmon for appointment regarding depression.

## 2015-11-13 NOTE — Telephone Encounter (Signed)
Spoke with [place] @ Dr. Ileene Hutchinson [facility] office.  Appointment made for 8/22 @ 10:30.  Patient to arrive by 10:15; bring insurance card, photo ID and a list of her current medications.    Spoke with patient and made her aware of this appointment date/time, provided location, address, phone number as well.

## 2015-11-13 NOTE — Progress Notes (Signed)
   Subjective:    Patient ID: Vanessa Harmon, female    DOB: 05/15/1950, 65 y.o.   MRN: [id number]  HPI 65 year old Female in today complaining of recurrent sinus infection symptoms. Thinks it may be related to bout of allergies recently and hot weather. She was here in April with acute sinusitis treated with Biaxin. Had similar bout in January 2017. History of allergic rhinitis. Says that back in April while she was taking antibiotic, she coughed a tablet out of her sinus. She is concerned she may have some defect in her sinus. She does see an allergist with fairly regular basis.  Also suffering from depression. Husband is 29 years old. She shows dogs. A couple of the dogs have health problems and that his been depressing for her. She's  had some home repairs that have taken longer than expected. Doesn't seem to have good relationship with stepchildren. She is on Lexapro currently for depression.  She has a history of polymyalgia rheumatica and is on 2 mg of prednisone currently.  History of hypertension, hyperlipidemia, impaired glucose tolerance and chronic low back pain.  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  Review of Systems see above     Objective:   Physical Exam Skin warm and dry. Nodes none. TMs are clear. Pharynx is clear. Neck is supple. Chest clear to auscultation without rales or wheezing. She sounds nasally congested.  Affect is flat and she does appear to be depressed       Assessment & Plan:  Allergic rhinitis  Recurrent sinusitis-refer to ENT for evaluation  History of polymyalgia rheumatica currently on prednisone 2 mg daily  Depression-currently on  Lexapro. Refer to Dr. Doroteo Glassman, psychologist  Plan: [place] Pare DS 10 mg 6 day dosepak. Levaquin 500 milligrams daily for 10 days. Appointment with ENT physician. Encouraged her to go back and see allergist.

## 2015-11-16 ENCOUNTER — Ambulatory Visit (INDEPENDENT_AMBULATORY_CARE_PROVIDER_SITE_OTHER): Payer: Medicare Other

## 2015-11-16 ENCOUNTER — Encounter: Payer: Self-pay | Admitting: Podiatry

## 2015-11-16 ENCOUNTER — Ambulatory Visit (INDEPENDENT_AMBULATORY_CARE_PROVIDER_SITE_OTHER): Payer: Medicare Other | Admitting: Podiatry

## 2015-11-16 VITALS — BP 163/83 | HR 57 | Resp 18

## 2015-11-16 DIAGNOSIS — R52 Pain, unspecified: Secondary | ICD-10-CM | POA: Diagnosis not present

## 2015-11-16 DIAGNOSIS — B351 Tinea unguium: Secondary | ICD-10-CM

## 2015-11-16 DIAGNOSIS — L853 Xerosis cutis: Secondary | ICD-10-CM

## 2015-11-16 DIAGNOSIS — D3610 Benign neoplasm of peripheral nerves and autonomic nervous system, unspecified: Secondary | ICD-10-CM

## 2015-11-16 DIAGNOSIS — M722 Plantar fascial fibromatosis: Secondary | ICD-10-CM | POA: Diagnosis not present

## 2015-11-16 DIAGNOSIS — M79673 Pain in unspecified foot: Secondary | ICD-10-CM

## 2015-11-16 NOTE — Progress Notes (Addendum)
   Subjective:    Patient ID: Vanessa Harmon, female    DOB: 03-30-1951, 65 y.o.   MRN: [id number]  HPI  55 fascial female presents the office they for concerns of rest her right foot as well as fungus in her toenails. She states that her second toe hurts on her left foot and she is That she may have a Morton's neuroma both of her feet. She is also having pain in the arch of her feet. She  denies any numbness or tingling. No swelling or redness. No recent injury or trauma. No other complaints at this time.  Review of Systems  All other systems reviewed and are negative.      Objective:   Physical Exam General: AAO x3, NAD  Dermatological: There is dry, xerotic skin to her feet. There is no fissuring or open sores identified at this time. No open lesions or pre-ulcerative lesions. There does. Be some hypertrophy, dystrophy discoloration the toenails. No tenderness the nails noted swelling redness or drainage.   Vascular: Dorsalis Pedis artery and Posterior Tibial artery pedal pulses are 2/4 bilateral with immedate capillary fill time. Pedal hair growth present.  There is no pain with calf compression, swelling, warmth, erythema.   Neruologic: Grossly intact via light touch bilateral. Vibratory intact via tuning fork bilateral. Protective threshold with Semmes Wienstein monofilament intact to all pedal sites bilateral.   Musculoskeletal: Hammertoe contractures are present. Tailors bunion present. There is tenderness the second MTPJ on the second interspaces bilaterally. I'm able to elicit a clicking sensation of the second interspace on the feet bilaterally. There is no area pinpoint bony tenderness or pain the vibratory sensation. There is tenderness on the medial band of plantar fascial in the arch of the foot. There is no specific area pinpoint bony insertion pain the vibratory sensation. MMT 5/5. Range of motion intact.   Assessment : Xerosis, possible onychomycosis, tendinitis/arch pain,  possible neuroma   Plan: -Treatment options discussed including all alternatives, risks, and complications -Etiology of symptoms were discussed -Recommended a area cream or moisturizer to the feet daily.  -Discussed shoe gear modifications I discussed with her orthotics. She was scanned for orthotics today and they were sent to [facility] labs.  -Stretching exercises discussed.  -Discussed injections for the neuroma bilaterally.  -Follow up in 4 weeks or sooner.  Celesta Gentile, DPM

## 2015-11-19 ENCOUNTER — Encounter: Payer: Self-pay | Admitting: Internal Medicine

## 2015-11-19 ENCOUNTER — Telehealth: Payer: Self-pay

## 2015-11-19 ENCOUNTER — Ambulatory Visit
Admission: RE | Admit: 2015-11-19 | Discharge: 2015-11-19 | Disposition: A | Discharge status: Home / Self Care | Payer: Medicare Other | Source: Ambulatory Visit | Attending: Internal Medicine | Admitting: Internal Medicine

## 2015-11-19 ENCOUNTER — Ambulatory Visit (INDEPENDENT_AMBULATORY_CARE_PROVIDER_SITE_OTHER): Payer: Medicare Other | Admitting: Internal Medicine

## 2015-11-19 VITALS — BP 134/70 | HR 58 | Temp 97.7°F | Ht 67.0 in | Wt 176.0 lb

## 2015-11-19 DIAGNOSIS — R05 Cough: Secondary | ICD-10-CM

## 2015-11-19 DIAGNOSIS — F32A Depression, unspecified: Secondary | ICD-10-CM

## 2015-11-19 DIAGNOSIS — R059 Cough, unspecified: Secondary | ICD-10-CM

## 2015-11-19 DIAGNOSIS — J181 Lobar pneumonia, unspecified organism: Principal | ICD-10-CM

## 2015-11-19 DIAGNOSIS — J189 Pneumonia, unspecified organism: Secondary | ICD-10-CM | POA: Diagnosis not present

## 2015-11-19 DIAGNOSIS — G4700 Insomnia, unspecified: Secondary | ICD-10-CM | POA: Diagnosis not present

## 2015-11-19 DIAGNOSIS — F329 Major depressive disorder, single episode, unspecified: Secondary | ICD-10-CM | POA: Diagnosis not present

## 2015-11-19 MED ORDER — CLARITHROMYCIN 500 MG PO TABS: 500.0000 mg | ORAL_TABLET | Freq: Two times a day (BID) | ORAL | 0 refills | Status: DC

## 2015-11-19 MED ORDER — PREDNISONE 10 MG PO TABS: ORAL_TABLET | ORAL | 0 refills | Status: DC

## 2015-11-19 MED ORDER — ZOLPIDEM TARTRATE 5 MG PO TABS: 5.0000 mg | ORAL_TABLET | Freq: Every evening | ORAL | 1 refills | Status: DC | PRN

## 2015-11-19 MED ORDER — BUDESONIDE-FORMOTEROL FUMARATE 160-4.5 MCG/ACT IN AERO: 2.0000 | INHALATION_SPRAY | Freq: Two times a day (BID) | RESPIRATORY_TRACT | 3 refills | Status: DC

## 2015-11-19 MED ORDER — CEFTRIAXONE SODIUM 1 G IJ SOLR
1.0000 g | Freq: Once | INTRAMUSCULAR | Status: AC
  Administered 2015-11-19: 1 g via INTRAMUSCULAR

## 2015-11-19 NOTE — Telephone Encounter (Signed)
Spoke with patient; provided appointment for today, 11/19/15 @ 4:15 per Dr Renold Genta.  Patient confirmed.

## 2015-11-19 NOTE — Telephone Encounter (Signed)
Ask for her to return this afternoon

## 2015-11-19 NOTE — Progress Notes (Signed)
   Subjective:    Patient ID: Vanessa Harmon, female    DOB: 01/08/1951, 65 y.o.   MRN: [id number]  HPI She was here July 28 complaining of recurrent sinus infection symptoms. She was treated with Levaquin and a tapering course of prednisone over 6 days. She called today and said she was no better and had some chest heaviness and tightness. We have asked her to be seen today.  She is afebrile. Pulse oximetry is normal at 98%. She looks fatigued.  Says she's not sleeping despite 2 mg of Ativan.  She has deep congested cough.  She has albuterol inhaler but I don't think his been using it consistently.  Slight discolored sputum production  No fever or shaking chills    Review of Systems see above     Objective:   Physical Exam She looks fatigued. Skin warm and dry. Pharynx and TMs are clear. Neck is supple without adenopathy. Chest is clear with the exception of rales in the right lower lobe       Assessment & Plan:  Right lower lobe pneumonia  Insomnia  Plan: Rocephin 1 g IM. Change Levaquin to Biaxin 500 mg daily for 10 days. Sterapred DS 10 mg 6 day dosepak. Change Ativan to Ambien 5-10 mg at bedtime when necessary sleep. Have chest x-ray today. Rest and drink plenty of fluids. Symbicort 160   2 sprays by mouth every 12 hours. Albuterol inhaler 2 sprays by mouth 4 times daily.

## 2015-11-19 NOTE — Patient Instructions (Signed)
1 g IM Rocephin. Change Levaquin to Biaxin 500 mg daily for 10 days. Take with food. Repeat Sterapred DS 10 mg six-day Dosepak. Symbicort 162 sprays by mouth every 12 hours. Use albuterol inhaler 2 sprays by mouth 4 times daily. Rest and drink plenty of fluids. For insomnia change Ativan to Ambien 5-10 mg at bedtime

## 2015-11-19 NOTE — Telephone Encounter (Signed)
Pt says her sx(s) have increased since OV on 11/13/15. Having chest tightness, x7 days. Did not go to ER.  Will fwd to Dr. Renold Genta per patient's request.

## 2015-12-08 DIAGNOSIS — J3089 Other allergic rhinitis: Secondary | ICD-10-CM | POA: Diagnosis not present

## 2015-12-08 DIAGNOSIS — J329 Chronic sinusitis, unspecified: Secondary | ICD-10-CM | POA: Insufficient documentation

## 2015-12-08 DIAGNOSIS — J324 Chronic pansinusitis: Secondary | ICD-10-CM | POA: Diagnosis not present

## 2015-12-14 ENCOUNTER — Ambulatory Visit (INDEPENDENT_AMBULATORY_CARE_PROVIDER_SITE_OTHER): Payer: Medicare Other | Admitting: Podiatry

## 2015-12-14 ENCOUNTER — Encounter: Payer: Self-pay | Admitting: Podiatry

## 2015-12-14 DIAGNOSIS — M79673 Pain in unspecified foot: Secondary | ICD-10-CM

## 2015-12-14 DIAGNOSIS — D3610 Benign neoplasm of peripheral nerves and autonomic nervous system, unspecified: Secondary | ICD-10-CM

## 2015-12-14 DIAGNOSIS — M722 Plantar fascial fibromatosis: Secondary | ICD-10-CM

## 2015-12-14 NOTE — Progress Notes (Signed)
Patient presents the pick up orthotics. She was seen by the CMA to dispense  orthotics. She declined appointment with myself. Follow-up in 4 weeks or sooner if any issues are to arise.

## 2015-12-14 NOTE — Patient Instructions (Signed)

## 2015-12-25 ENCOUNTER — Other Ambulatory Visit: Payer: Self-pay | Admitting: Internal Medicine

## 2015-12-26 NOTE — Telephone Encounter (Signed)
Refill each x one year

## 2016-01-05 ENCOUNTER — Ambulatory Visit (INDEPENDENT_AMBULATORY_CARE_PROVIDER_SITE_OTHER): Payer: Medicare Other | Admitting: Allergy and Immunology

## 2016-01-05 ENCOUNTER — Encounter (INDEPENDENT_AMBULATORY_CARE_PROVIDER_SITE_OTHER): Payer: Self-pay

## 2016-01-05 ENCOUNTER — Encounter: Payer: Self-pay | Admitting: Allergy and Immunology

## 2016-01-05 VITALS — BP 156/90 | HR 76 | Temp 98.1°F | Resp 20 | Ht 66.0 in | Wt 183.0 lb

## 2016-01-05 DIAGNOSIS — J4530 Mild persistent asthma, uncomplicated: Secondary | ICD-10-CM

## 2016-01-05 DIAGNOSIS — J3089 Other allergic rhinitis: Secondary | ICD-10-CM | POA: Diagnosis not present

## 2016-01-05 DIAGNOSIS — J4540 Moderate persistent asthma, uncomplicated: Secondary | ICD-10-CM | POA: Diagnosis not present

## 2016-01-05 MED ORDER — RANITIDINE HCL 300 MG PO TABS: 300.0000 mg | ORAL_TABLET | Freq: Every day | ORAL | 5 refills | Status: DC

## 2016-01-05 MED ORDER — MOMETASONE FURO-FORMOTEROL FUM 200-5 MCG/ACT IN AERO: 2.0000 | INHALATION_SPRAY | Freq: Two times a day (BID) | RESPIRATORY_TRACT | 5 refills | Status: DC

## 2016-01-05 MED ORDER — MONTELUKAST SODIUM 10 MG PO TABS: 10.0000 mg | ORAL_TABLET | Freq: Every day | ORAL | 5 refills | Status: DC

## 2016-01-05 MED ORDER — DEXLANSOPRAZOLE 60 MG PO CPDR: 60.0000 mg | DELAYED_RELEASE_CAPSULE | Freq: Every day | ORAL | 5 refills | Status: DC

## 2016-01-05 NOTE — Patient Instructions (Addendum)
  1. Allergen avoidance measures  2. Treat inflammation:   A. continue nasal fluticasone one spray each nostril twice a day  B. continue montelukast 10 mg one tablet one time per day  C. start Dulera 200 - 2 inhalations twice a day with spacer  3. Treat reflux:   A. consolidate all caffeine and chocolate consumption  B. Dexilant 60 mg one tablet in AM  C. ranitidine 300 mg one tablet in PM  4. If needed:   A. Astelin  B. OTC Mucinex DM 2 tablets twice a day  C. ProAir HFA 2 puffs every 4-6 hours  5. Return to clinic in 3 weeks  6. Further evaluation? Methotrexate side effect?  7. Obtain fall flu vaccine

## 2016-01-05 NOTE — Progress Notes (Signed)
Dear Dr. Renold Genta,  Thank you for referring Vanessa Harmon to the [facility] on 01/05/2016.   Below is a summation of this patient's evaluation and recommendations.  Thank you for your referral. I will keep you informed about this patient's response to treatment.   If you have any questions please to do hesitate to contact me.   Sincerely,  Jiles Prows, MD [place]   ______________________________________________________________________    NEW PATIENT NOTE  Referring Provider: Elby Showers, MD Primary Provider: Elby Showers, MD Date of office visit: 01/05/2016    Subjective:   Chief Complaint:  Vanessa Harmon (DOB: 1951/01/22) is a 65 y.o. female who presents to the clinic on 01/05/2016 with a chief complaint of Allergies and Sinusitis .     HPI: Vanessa Harmon presents to this clinic in evaluation of respiratory tract problems. I initially saw her in his clinic over a decade ago for issues of allergic rhinoconjunctivitis and LPR.  Vanessa Harmon informs me that for the past few months she's been having lots of cough. She coughs both during the daytime and the nighttime and she describes her cough as "harsh". She cannot get a very deep breath and she feels occasionally short of breath and maybe some chest tightness. She apparently had a chest x-ray 6 weeks ago which was normal. There is no associated chest pain or ugly phlegm production and there is no obvious trigger giving rise to this issue. She had been given Symbicort which did not help her. She's been given albuterol which did not help her.   Vanessa Harmon does have some issues with constant throat clearing. She takes over-the-counter Nexium once a day for her reflux. She still continues to consume caffeine to some degree.  She's been having "sinusitis" with a flare in October and April and August. Her August flare required the administration of  Levaquin and Biaxin and two steroid tapering doses. She complained about head fullness and pain in her face as well as inability to smell. Apparently her inability to smell has been a long-standing issue. She did visit with ENT who performed a CT scan of her sinuses which did not identify any significant sinusitis in August 2017.  Vanessa Harmon has been using methotrexate for greater than 2 years for an inflammatory arthritis.  Past Medical History:  Diagnosis Date  . Adenomatous polyp   . Anxiety    takes Ativan daily as needed  . Arthritis    inflammatory arthritis (Dr. Dagoberto Ligas)  . Chronic back pain    scoliosis   . Constipation    takes miralax every other day  . Depression    takes Lexapro daily  . Essential hypertension, benign    takes Hyzaar and Metoprolol daily  . History of blood transfusion   . History of bronchitis 5 rys ago  . History of colon polyps   . History of shingles   . Hyperlipidemia    takes Atorvastatin daily  . Insomnia    takes Melatonin nightly  . Joint pain   . Pneumonia as a child  . PSVT (paroxysmal supraventricular tachycardia) ([place])   . Rhinitis, allergic    uses Flonase daily  . Urinary urgency     Past Surgical History:  Procedure Laterality Date  . APPENDECTOMY    . BACK SURGERY  2012   at age 88 d/t scoliosis  . BREAST BIOPSY    .  cataract surgery    . COLONOSCOPY    . KNEE ARTHROSCOPY Bilateral   . neuroma removed from foot     unsure of which foot  . SHOULDER SURGERY Right   . TONSILLECTOMY    . TOTAL HIP ARTHROPLASTY Right 04/15/2014   Procedure: RIGHT TOTAL HIP ARTHROPLASTY ANTERIOR APPROACH;  Surgeon: Renette Butters, MD;  Location: [facility];  Service: Orthopedics;  Laterality: Right;      Medication List      aspirin 81 MG tablet Take 81 mg by mouth daily.   atorvastatin 80 MG tablet Commonly known as:  LIPITOR TAKE 1/2 BY MOUTH ONCE DAILY   azelastine 0.1 % nasal spray Commonly known as:  ASTELIN INSTILL 1 SPRAY  INTO EACH NOSTRIL TWICE DAILY   CALCIUM + D PO Take by mouth 2 (two) times daily.   escitalopram 20 MG tablet Commonly known as:  LEXAPRO TAKE 1 TABLET BY MOUTH DAILY   esomeprazole 20 MG capsule Commonly known as:  NEXIUM Take 20 mg by mouth daily at 12 noon.   fish oil-omega-3 fatty acids 1000 MG capsule Take 1 g by mouth 2 (two) times daily.   fluticasone 50 MCG/ACT nasal spray Commonly known as:  FLONASE INSTILL TWO   SPRAYS INTO EACH NOSTRIL EVERY DAY   folic acid 1 MG tablet Commonly known as:  FOLVITE Take 1 mg by mouth daily.   indomethacin 75 MG CR capsule Commonly known as:  INDOCIN SR TAKE 1 CAPSULE BY MOUTH ONCE DAILY   LORazepam 1 MG tablet Commonly known as:  ATIVAN Take 1 tab q am and 2 tab qhs   losartan-hydrochlorothiazide 100-12.5 MG tablet Commonly known as:  HYZAAR TAKE ONE TABLET   BY MOUTH   DAILY   methotrexate 25 MG/ML injection Inject 15 mg into the skin once a week. 15 mg=0.8 ml  Every Wednesday   montelukast 10 MG tablet Commonly known as:  SINGULAIR Take 10 mg by mouth daily.   multivitamin capsule Take 1 capsule by mouth daily.   PROAIR HFA 108 (90 Base) MCG/ACT inhaler Generic drug:  albuterol Inhale 2 puffs into the lungs every 4 (four) hours as needed for wheezing or shortness of breath.   vitamin C 1000 MG tablet Take 1,000 mg by mouth daily.       Allergies  Allergen Reactions  . Erythromycin Nausea Only  . Tape Rash    PAPER TAPE: Causes severe rash  . Tetracyclines & Related Nausea Only    Review of systems negative except as noted in HPI / PMHx or noted below:  Review of Systems  Constitutional: Negative.   HENT: Negative.   Eyes: Negative.   Respiratory: Negative.   Cardiovascular: Negative.   Gastrointestinal: Negative.   Genitourinary: Negative.   Musculoskeletal: Negative.   Skin: Negative.   Neurological: Negative.   Endo/Heme/Allergies: Negative.   Psychiatric/Behavioral: Negative.     Family  History  Problem Relation Age of Onset  . Cancer Mother   . Stroke Mother     Social History   Social History  . Marital status: Married    Spouse name: N/A  . Number of children: N/A  . Years of education: N/A   Occupational History  . Not on file.   Social History Main Topics  . Smoking status: Former Research scientist (life sciences)  . Smokeless tobacco: Never Used     Comment: hasn't smoked in 36yrs  . Alcohol use Yes     Comment: glass or 2 of  wine nightly  . Drug use: No  . Sexual activity: Yes    Birth control/ protection: Post-menopausal   Other Topics Concern  . Not on file   Social History Narrative  . No narrative on file    Environmental and Social history  Lives in a house with a dry environment, dogs located inside the household, carpeting in the bedroom, no plastic on the bed or pillow, no smoking located inside the household.  Objective:   Vitals:   01/05/16 0923  BP: (!) 156/90  Pulse: 76  Resp: 20  Temp: 98.1 F (36.7 C)   Height: 5\' 6"  (167.6 cm) Weight: 183 lb (83 kg)  Physical Exam  Constitutional: She is well-developed, well-nourished, and in no distress.  Allergic shiners, slight proptosis left eye  HENT:  Head: Normocephalic. Head is without right periorbital erythema and without left periorbital erythema.  Right Ear: Tympanic membrane, external ear and ear canal normal.  Left Ear: Tympanic membrane, external ear and ear canal normal.  Nose: Nose normal. No mucosal edema or rhinorrhea.  Mouth/Throat: Uvula is midline, oropharynx is clear and moist and mucous membranes are normal. No oropharyngeal exudate.  Eyes: Conjunctivae and lids are normal. Pupils are equal, round, and reactive to light.  Neck: Trachea normal. No tracheal tenderness present. No tracheal deviation present. No thyromegaly present.  Cardiovascular: Normal rate, regular rhythm, S1 normal, S2 normal and normal heart sounds.   No murmur heard. Pulmonary/Chest: Effort normal and breath  sounds normal. No stridor. No tachypnea. No respiratory distress. She has no wheezes. She has no rales. She exhibits no tenderness.  Abdominal: Soft. She exhibits no distension and no mass. There is no hepatosplenomegaly. There is no tenderness. There is no rebound and no guarding.  Musculoskeletal: She exhibits no edema or tenderness.  Lymphadenopathy:       Head (right side): No tonsillar adenopathy present.       Head (left side): No tonsillar adenopathy present.    She has no cervical adenopathy.    She has no axillary adenopathy.  Neurological: She is alert. Gait normal.  Skin: No rash noted. She is not diaphoretic. No erythema. No pallor. Nails show no clubbing.  Psychiatric: Mood and affect normal.    Diagnostics: Allergy skin tests were performed. She demonstrated hypersensitivity to dog and mold  Spirometry was performed and demonstrated an FEV1 of 2.38 @ 97 % of predicted. Following the administration of nebulized albuterol her FEV1 did not change significantly  Results of a chest x-ray obtained on 11/19/2015 identified no significant abnormality  Results of a sinus CT obtained 08 December 2015 identified:  Trace LEFT maxillary sinus mucosal thickening. Ethmoid,sphenoid and frontal sinuses are well aerated. Bilateral ostiomeatal units, frontal and sphenoid ethmoidal recesses are patent. RIGHTconcha bullosa. LEFT concha lamella. Type 2 LEFT frontal air cell. RIGHT [place] air cell. Pneumatized anterior clinoid. No abnormal antral expansion or atresia, bony wall thickening/mucoperiostealreaction. Tortuous nasal septum, small spur directed to the RIGHT.LEFT inferior turbinate is enlarged, mildly effacing the airway.   Assessment and Plan:    1. Mild persistent asthma, uncomplicated   2. Other allergic rhinitis     1. Allergen avoidance measures  2. Treat inflammation:   A. continue nasal fluticasone one spray each nostril twice a day  B. continue montelukast 10 mg one tablet  one time per day  C. start Dulera 200 - 2 inhalations twice a day with spacer  3. Treat reflux:   A. consolidate all caffeine and chocolate  consumption  B. Dexilant 60 mg one tablet in AM  C. ranitidine 300 mg one tablet in PM  4. If needed:   A. Astelin  B. OTC Mucinex DM 2 tablets twice a day  C. ProAir HFA 2 puffs every 4-6 hours  5. Return to clinic in 3 weeks  6. Further evaluation? Methotrexate side effect?  7. Obtain fall flu vaccine   I will assume at this point in time then Khelsea has irritation and inflammation of her respiratory tract based upon atopic disease and reflux-induced respiratory disease and have her use the therapy mentioned above to address both these issues. If she does not do well with this approach then I think we need to consider the possibility that she's having a pulmonary problem from her methotrexate use and she may require a more detailed imaging study of her lungs to look for the presence of possible methotrexate-induced inflammation and fibrosis. Hopefully we will not need to have her go down that road of evaluation.  Jiles Prows, MD [facility]

## 2016-01-06 DIAGNOSIS — M1990 Unspecified osteoarthritis, unspecified site: Secondary | ICD-10-CM | POA: Diagnosis not present

## 2016-01-06 DIAGNOSIS — R7989 Other specified abnormal findings of blood chemistry: Secondary | ICD-10-CM | POA: Diagnosis not present

## 2016-01-06 DIAGNOSIS — Z7952 Long term (current) use of systemic steroids: Secondary | ICD-10-CM | POA: Diagnosis not present

## 2016-01-06 DIAGNOSIS — M150 Primary generalized (osteo)arthritis: Secondary | ICD-10-CM | POA: Diagnosis not present

## 2016-01-06 DIAGNOSIS — M5136 Other intervertebral disc degeneration, lumbar region: Secondary | ICD-10-CM | POA: Diagnosis not present

## 2016-01-08 ENCOUNTER — Telehealth: Payer: Self-pay

## 2016-01-08 MED ORDER — FLUTICASONE FUROATE-VILANTEROL 200-25 MCG/INH IN AEPB: 1.0000 | INHALATION_SPRAY | Freq: Every day | RESPIRATORY_TRACT | 3 refills | Status: DC

## 2016-01-08 MED ORDER — OMEPRAZOLE 40 MG PO CPDR: 40.0000 mg | DELAYED_RELEASE_CAPSULE | Freq: Every day | ORAL | 5 refills | Status: DC

## 2016-01-08 NOTE — Telephone Encounter (Signed)
Pt advised that Dulera changed to Breo 200, one puff one time a day due to insurance coverage. Also, Pt request that Dexilant be changed to Omprazole 40 mg also due to insurance issues. RX sent.

## 2016-01-22 ENCOUNTER — Other Ambulatory Visit: Payer: Self-pay | Admitting: Internal Medicine

## 2016-01-22 NOTE — Telephone Encounter (Signed)
Refill x 3 months 

## 2016-01-27 ENCOUNTER — Ambulatory Visit (INDEPENDENT_AMBULATORY_CARE_PROVIDER_SITE_OTHER): Payer: Medicare Other | Admitting: Allergy and Immunology

## 2016-01-27 ENCOUNTER — Encounter: Payer: Self-pay | Admitting: Allergy and Immunology

## 2016-01-27 VITALS — BP 130/96 | HR 60 | Resp 16

## 2016-01-27 DIAGNOSIS — K219 Gastro-esophageal reflux disease without esophagitis: Secondary | ICD-10-CM | POA: Diagnosis not present

## 2016-01-27 DIAGNOSIS — J3089 Other allergic rhinitis: Secondary | ICD-10-CM | POA: Diagnosis not present

## 2016-01-27 DIAGNOSIS — J4530 Mild persistent asthma, uncomplicated: Secondary | ICD-10-CM

## 2016-01-27 DIAGNOSIS — R7989 Other specified abnormal findings of blood chemistry: Secondary | ICD-10-CM | POA: Diagnosis not present

## 2016-01-27 DIAGNOSIS — Z7952 Long term (current) use of systemic steroids: Secondary | ICD-10-CM | POA: Diagnosis not present

## 2016-01-27 NOTE — Progress Notes (Signed)
Follow-up Note  Referring Provider: Elby Showers, MD Primary Provider: Elby Showers, MD Date of Office Visit: 01/27/2016  Subjective:   Vanessa Harmon (DOB: 1951-01-04) is a 65 y.o. female who returns to the [facility] on 01/27/2016 in re-evaluation of the following:  HPI: Girdie returns to this clinic in reevaluation of her asthma and allergic rhinitis and LPR. I last saw her in his clinic during her initial evaluation of 01/05/2016.  She has improved regarding her lower airway symptoms. Her cough has improved tremendously. Her throat has improved as well. She still continues to consume 2 cups of coffee per day. She continues on a proton pump inhibitor, now using omeprazole secondary to an insurance issue, in conjunction with an H2 receptor blocker. She continues on nasal fluticasone and montelukast and has had her Dulera changed to [place] secondary to an insurance issue. She does not need to use a short acting bronchodilator.  She still feels somewhat congested in her nose. She still has some intermittent ear fullness on occasion. She's not been able to smell for years.    Medication List      aspirin 81 MG tablet Take 81 mg by mouth daily.   atorvastatin 80 MG tablet Commonly known as:  LIPITOR TAKE 1/2 BY MOUTH ONCE DAILY   azelastine 0.1 % nasal spray Commonly known as:  ASTELIN INSTILL 1 SPRAY INTO EACH NOSTRIL TWICE DAILY   CALCIUM + D PO Take by mouth 2 (two) times daily.   escitalopram 20 MG tablet Commonly known as:  LEXAPRO TAKE 1 TABLET BY MOUTH DAILY   fish oil-omega-3 fatty acids 1000 MG capsule Take 1 g by mouth 2 (two) times daily.   fluticasone 50 MCG/ACT nasal spray Commonly known as:  FLONASE INSTILL TWO   SPRAYS INTO EACH NOSTRIL EVERY DAY   fluticasone furoate-vilanterol 200-25 MCG/INH Aepb Commonly known as:  BREO ELLIPTA Inhale 1 puff into the lungs daily.   folic acid 1 MG tablet Commonly known as:  FOLVITE Take 1 mg by  mouth daily.   indomethacin 75 MG CR capsule Commonly known as:  INDOCIN SR TAKE 1 CAPSULE BY MOUTH ONCE DAILY   LORazepam 1 MG tablet Commonly known as:  ATIVAN TAKE ONE TABLET   BY MOUTH   DAILY IN THE MORNING AND TWO   TABLET BY MOUTH   AT BEDTIME   losartan-hydrochlorothiazide 100-12.5 MG tablet Commonly known as:  HYZAAR TAKE ONE TABLET   BY MOUTH   DAILY   methotrexate 25 MG/ML injection Inject 15 mg into the skin once a week. 15 mg=0.8 ml  Every Wednesday   montelukast 10 MG tablet Commonly known as:  SINGULAIR Take 1 tablet (10 mg total) by mouth daily.   multivitamin capsule Take 1 capsule by mouth daily.   omeprazole 40 MG capsule Commonly known as:  PRILOSEC Take 1 capsule (40 mg total) by mouth daily.   PREDNISONE PO Take by mouth. Taper dose   PROAIR HFA 108 (90 Base) MCG/ACT inhaler Generic drug:  albuterol Inhale 2 puffs into the lungs every 4 (four) hours as needed for wheezing or shortness of breath.   ranitidine 300 MG tablet Commonly known as:  ZANTAC Take 1 tablet (300 mg total) by mouth at bedtime.   vitamin C 1000 MG tablet Take 1,000 mg by mouth daily.       Past Medical History:  Diagnosis Date  . Adenomatous polyp   . Anxiety  takes Ativan daily as needed  . Arthritis    inflammatory arthritis (Dr. Dagoberto Ligas)  . Chronic back pain    scoliosis   . Constipation    takes miralax every other day  . Depression    takes Lexapro daily  . Essential hypertension, benign    takes Hyzaar and Metoprolol daily  . History of blood transfusion   . History of bronchitis 5 rys ago  . History of colon polyps   . History of shingles   . Hyperlipidemia    takes Atorvastatin daily  . Insomnia    takes Melatonin nightly  . Joint pain   . Pneumonia as a child  . PSVT (paroxysmal supraventricular tachycardia) ([place])   . Rhinitis, allergic    uses Flonase daily  . Urinary urgency     Past Surgical History:  Procedure Laterality  Date  . APPENDECTOMY    . BACK SURGERY  2012   at age 25 d/t scoliosis  . BREAST BIOPSY    . cataract surgery    . COLONOSCOPY    . KNEE ARTHROSCOPY Bilateral   . neuroma removed from foot     unsure of which foot  . SHOULDER SURGERY Right   . TONSILLECTOMY    . TOTAL HIP ARTHROPLASTY Right 04/15/2014   Procedure: RIGHT TOTAL HIP ARTHROPLASTY ANTERIOR APPROACH;  Surgeon: Renette Butters, MD;  Location: [facility];  Service: Orthopedics;  Laterality: Right;    Allergies  Allergen Reactions  . Erythromycin Nausea Only  . Tape Rash    PAPER TAPE: Causes severe rash  . Tetracyclines & Related Nausea Only    Review of systems negative except as noted in HPI / PMHx or noted below:  Review of Systems  Constitutional: Negative.   HENT: Negative.   Eyes: Negative.   Respiratory: Negative.   Cardiovascular: Negative.   Gastrointestinal: Negative.   Genitourinary: Negative.   Musculoskeletal: Negative.   Skin: Negative.   Neurological: Negative.   Endo/Heme/Allergies: Negative.   Psychiatric/Behavioral: Negative.      Objective:   Vitals:   01/27/16 1113  BP: (!) 130/96  Pulse: 60  Resp: 16          Physical Exam  Constitutional: She is well-developed, well-nourished, and in no distress.  HENT:  Head: Normocephalic.  Right Ear: Tympanic membrane, external ear and ear canal normal.  Left Ear: Tympanic membrane, external ear and ear canal normal.  Nose: Nose normal. No mucosal edema or rhinorrhea.  Mouth/Throat: Uvula is midline, oropharynx is clear and moist and mucous membranes are normal. No oropharyngeal exudate.  Eyes: Conjunctivae are normal.  Neck: Trachea normal. No tracheal tenderness present. No tracheal deviation present. No thyromegaly present.  Cardiovascular: Normal rate, regular rhythm, S1 normal, S2 normal and normal heart sounds.   No murmur heard. Pulmonary/Chest: Breath sounds normal. No stridor. No respiratory distress. She has no wheezes. She has  no rales.  Musculoskeletal: She exhibits no edema.  Lymphadenopathy:       Head (right side): No tonsillar adenopathy present.       Head (left side): No tonsillar adenopathy present.    She has no cervical adenopathy.  Neurological: She is alert. Gait normal.  Skin: No rash noted. She is not diaphoretic. No erythema. Nails show no clubbing.  Psychiatric: Mood and affect normal.    Diagnostics:    Spirometry was performed and demonstrated an FEV1 of 2.53 at 103 % of predicted.  Assessment and Plan:  1. Mild persistent asthma, uncomplicated   2. Other allergic rhinitis   3. LPRD (laryngopharyngeal reflux disease)     1. Start a course of immunotherapy  2. Continue to Treat inflammation:   A. continue nasal fluticasone one spray each nostril twice a day  B. continue montelukast 10 mg one tablet one time per day  C. Breo 200 - 1 inhalation one time per day with spacer  3. Continue to Treat reflux:   A. consolidate all caffeine and chocolate consumption  B. omeprazole 40 mg one tablet in AM  C. ranitidine 300 mg one tablet in PM  4. If needed:   A. Astelin  B. OTC Mucinex DM 2 tablets twice a day  C. ProAir HFA 2 puffs every 4-6 hours  5. Return to clinic in 12 weeks  6. Obtain fall flu vaccine   Lynnzie is doing better with her lower airways but her upper airways are still somewhat inflamed even in the face of utilizing all her medications as noted above. She would like to start a course of immunotherapy and we'll arrange to get that done over the course the next week or so. She will continue to use medical therapy directed against reflux in addition to her anti-inflammatory agents for her respiratory tract. I'll see her back in his clinic in 12 weeks or earlier if there is a problem. There may be an opportunity to taper her medicines at that point  Allena Katz, MD [place]

## 2016-01-27 NOTE — Patient Instructions (Addendum)
  1. Start a course of immunotherapy  2. Treat inflammation:   A. continue nasal fluticasone one spray each nostril twice a day  B. continue montelukast 10 mg one tablet one time per day  C. Breo 200 - 1 inhalation one time per day with spacer  3. Treat reflux:   A. consolidate all caffeine and chocolate consumption  B. omeprazole 40 mg one tablet in AM  C. ranitidine 300 mg one tablet in PM  4. If needed:   A. Astelin  B. OTC Mucinex DM 2 tablets twice a day  C. ProAir HFA 2 puffs every 4-6 hours  5. Return to clinic in 12 weeks  6. Obtain fall flu vaccine

## 2016-02-01 ENCOUNTER — Telehealth: Payer: Self-pay | Admitting: *Deleted

## 2016-02-01 ENCOUNTER — Other Ambulatory Visit: Payer: Self-pay | Admitting: Internal Medicine

## 2016-02-01 ENCOUNTER — Other Ambulatory Visit: Payer: Medicare Other | Admitting: Internal Medicine

## 2016-02-01 DIAGNOSIS — R5383 Other fatigue: Secondary | ICD-10-CM | POA: Diagnosis not present

## 2016-02-01 DIAGNOSIS — M150 Primary generalized (osteo)arthritis: Secondary | ICD-10-CM

## 2016-02-01 DIAGNOSIS — M4310 Spondylolisthesis, site unspecified: Secondary | ICD-10-CM | POA: Diagnosis not present

## 2016-02-01 DIAGNOSIS — M545 Low back pain: Secondary | ICD-10-CM | POA: Diagnosis not present

## 2016-02-01 DIAGNOSIS — I10 Essential (primary) hypertension: Secondary | ICD-10-CM | POA: Diagnosis not present

## 2016-02-01 DIAGNOSIS — M169 Osteoarthritis of hip, unspecified: Secondary | ICD-10-CM | POA: Diagnosis not present

## 2016-02-01 DIAGNOSIS — I159 Secondary hypertension, unspecified: Secondary | ICD-10-CM | POA: Diagnosis not present

## 2016-02-01 DIAGNOSIS — E785 Hyperlipidemia, unspecified: Secondary | ICD-10-CM

## 2016-02-01 DIAGNOSIS — M8949 Other hypertrophic osteoarthropathy, multiple sites: Secondary | ICD-10-CM

## 2016-02-01 DIAGNOSIS — Z131 Encounter for screening for diabetes mellitus: Secondary | ICD-10-CM | POA: Diagnosis not present

## 2016-02-01 DIAGNOSIS — M159 Polyosteoarthritis, unspecified: Secondary | ICD-10-CM

## 2016-02-01 LAB — CBC WITH DIFFERENTIAL/PLATELET
Basophils Absolute: 0 cells/uL (ref 0–200)
Basophils Relative: 0 %
Eosinophils Absolute: 71 cells/uL (ref 15–500)
Eosinophils Relative: 1 %
HCT: 41.7 % (ref 35.0–45.0)
Hemoglobin: 13.8 g/dL (ref 11.7–15.5)
Lymphocytes Relative: 24 %
Lymphs Abs: 1704 cells/uL (ref 850–3900)
MCH: 33.1 pg — ABNORMAL HIGH (ref 27.0–33.0)
MCHC: 33.1 g/dL (ref 32.0–36.0)
MCV: 100 fL (ref 80.0–100.0)
MPV: 12.2 fL (ref 7.5–12.5)
Monocytes Absolute: 497 cells/uL (ref 200–950)
Monocytes Relative: 7 %
Neutro Abs: 4828 cells/uL (ref 1500–7800)
Neutrophils Relative %: 68 %
Platelets: 252 10*3/uL (ref 140–400)
RBC: 4.17 MIL/uL (ref 3.80–5.10)
RDW: 14.6 % (ref 11.0–15.0)
WBC: 7.1 10*3/uL (ref 3.8–10.8)

## 2016-02-01 LAB — COMPREHENSIVE METABOLIC PANEL
ALT: 51 U/L — ABNORMAL HIGH (ref 6–29)
AST: 40 U/L — ABNORMAL HIGH (ref 10–35)
Albumin: 4.4 g/dL (ref 3.6–5.1)
Alkaline Phosphatase: 52 U/L (ref 33–130)
BUN: 13 mg/dL (ref 7–25)
CO2: 28 mmol/L (ref 20–31)
Calcium: 9.8 mg/dL (ref 8.6–10.4)
Chloride: 102 mmol/L (ref 98–110)
Creat: 0.8 mg/dL (ref 0.50–0.99)
Glucose, Bld: 102 mg/dL — ABNORMAL HIGH (ref 65–99)
Potassium: 4.2 mmol/L (ref 3.5–5.3)
Sodium: 140 mmol/L (ref 135–146)
Total Bilirubin: 0.7 mg/dL (ref 0.2–1.2)
Total Protein: 6.6 g/dL (ref 6.1–8.1)

## 2016-02-01 LAB — LIPID PANEL
Cholesterol: 234 mg/dL — ABNORMAL HIGH (ref 125–200)
HDL: 55 mg/dL (ref >=46)
LDL Cholesterol: 118 mg/dL (ref <130)
Total CHOL/HDL Ratio: 4.3 Ratio (ref <=5.0)
Triglycerides: 306 mg/dL — ABNORMAL HIGH (ref <150)
VLDL: 61 mg/dL — ABNORMAL HIGH (ref <30)

## 2016-02-01 LAB — TSH: TSH: 2.36 mIU/L

## 2016-02-01 NOTE — Telephone Encounter (Signed)
Pt requesting Vitamin B12 be added to todays blood work

## 2016-02-01 NOTE — Telephone Encounter (Signed)
You may add use fatigue as a reason

## 2016-02-02 ENCOUNTER — Ambulatory Visit (INDEPENDENT_AMBULATORY_CARE_PROVIDER_SITE_OTHER): Payer: Medicare Other | Admitting: Internal Medicine

## 2016-02-02 ENCOUNTER — Encounter: Payer: Self-pay | Admitting: Internal Medicine

## 2016-02-02 VITALS — BP 148/90 | HR 50 | Temp 97.4°F | Ht 66.0 in | Wt 177.5 lb

## 2016-02-02 DIAGNOSIS — M159 Polyosteoarthritis, unspecified: Secondary | ICD-10-CM

## 2016-02-02 DIAGNOSIS — Z23 Encounter for immunization: Secondary | ICD-10-CM | POA: Diagnosis not present

## 2016-02-02 DIAGNOSIS — M1990 Unspecified osteoarthritis, unspecified site: Secondary | ICD-10-CM

## 2016-02-02 DIAGNOSIS — R609 Edema, unspecified: Secondary | ICD-10-CM

## 2016-02-02 DIAGNOSIS — I10 Essential (primary) hypertension: Secondary | ICD-10-CM

## 2016-02-02 DIAGNOSIS — E782 Mixed hyperlipidemia: Secondary | ICD-10-CM

## 2016-02-02 DIAGNOSIS — Z96641 Presence of right artificial hip joint: Secondary | ICD-10-CM | POA: Diagnosis not present

## 2016-02-02 DIAGNOSIS — Z86010 Personal history of colonic polyps: Secondary | ICD-10-CM

## 2016-02-02 DIAGNOSIS — Z8659 Personal history of other mental and behavioral disorders: Secondary | ICD-10-CM

## 2016-02-02 DIAGNOSIS — Z87898 Personal history of other specified conditions: Secondary | ICD-10-CM

## 2016-02-02 DIAGNOSIS — M8949 Other hypertrophic osteoarthropathy, multiple sites: Secondary | ICD-10-CM

## 2016-02-02 DIAGNOSIS — F3289 Other specified depressive episodes: Secondary | ICD-10-CM

## 2016-02-02 DIAGNOSIS — K219 Gastro-esophageal reflux disease without esophagitis: Secondary | ICD-10-CM

## 2016-02-02 DIAGNOSIS — M791 Myalgia: Secondary | ICD-10-CM

## 2016-02-02 DIAGNOSIS — M7918 Myalgia, other site: Secondary | ICD-10-CM

## 2016-02-02 DIAGNOSIS — Z0000 Encounter for general adult medical examination without abnormal findings: Secondary | ICD-10-CM | POA: Diagnosis not present

## 2016-02-02 DIAGNOSIS — R5383 Other fatigue: Secondary | ICD-10-CM

## 2016-02-02 DIAGNOSIS — M150 Primary generalized (osteo)arthritis: Secondary | ICD-10-CM

## 2016-02-02 LAB — POCT URINALYSIS DIPSTICK
Bilirubin, UA: NEGATIVE
Blood, UA: NEGATIVE
Glucose, UA: NEGATIVE
Ketones, UA: NEGATIVE
Leukocytes, UA: NEGATIVE
Nitrite, UA: NEGATIVE
Protein, UA: NEGATIVE
Spec Grav, UA: <=1.005
Urobilinogen, UA: NEGATIVE
pH, UA: 7

## 2016-02-02 LAB — HEMOGLOBIN A1C
Hgb A1c MFr Bld: 5.3 % (ref <5.7)
Mean Plasma Glucose: 105 mg/dL

## 2016-02-02 LAB — VITAMIN D 25 HYDROXY (VIT D DEFICIENCY, FRACTURES): Vit D, 25-Hydroxy: 31 ng/mL (ref 30–100)

## 2016-02-02 LAB — VITAMIN B12: Vitamin B-12: 354 pg/mL (ref 200–1100)

## 2016-02-02 MED ORDER — ATENOLOL 25 MG PO TABS
25.0000 mg | ORAL_TABLET | Freq: Every day | ORAL | 1 refills | Status: DC
Start: 2016-02-02 — End: 2016-02-29

## 2016-02-02 NOTE — Telephone Encounter (Signed)
Done

## 2016-02-02 NOTE — Progress Notes (Signed)
Subjective:    Patient ID: Vanessa Harmon, female    DOB: 09/03/1950, 65 y.o.   MRN: [id number]  HPI  65 year old Female for health maintenance exam and evaluation of medical issues. Says BP has been elevated recently. Brings in multiple BP readings but has also recently been on Prednisone for Rheumatoid arthritis. Now on 7.5mg  prednisone. Triglycerides are elevated as well as cholesterol. Was started on course of prednisone Sept 20th.  Had LE edema this summer. Has mild elevation LFT's. Is on MTX for rheumatoid arthritis.  Takes Hyzaar 100/12.5 mg daily. Has been intolerant of amlodipine due to edema in the past.  Has felt fatigued and wants B-12 level checked.  Seems chronically depressed. Is on antidepressants.  He is to receive pneumococcal 23 in the near future. Has had Prevnar in October 2016.  She sees Dr. Amil Amen, rheumatologist.  She has a history of dysthymia, essential hypertension, hyperlipidemia, adenomatous colon polyps, GE reflux, paroxysmal supraventricular tachycardia and back pain. History of impaired glucose tolerance likely related to prednisone therapy.  She's tried many medications for hypertension and did not tolerate Altace or Cardura.  Had colonoscopy by Dr. Alycia Rossetti in 2005 and colonoscopy is due in the near future.  Fractured right wrist 2009, right shoulder surgery for labral tear June 2006, right knee arthroscopic by Dr. Noemi Chapel 2006. Left knee arthroscopically in [place] in the mid 1990s left breast biopsy 1996 or 1997. Blepharoplasty in the early 1990s. Remote history of fractured sacrum after falling in the late 1980s. Exploratory laparoscopy and appendectomy 1973. Tonsillectomy at age 2. Morton's neuroma removed from right foot in junior high school.  Social history: She is married. Does not smoke. Rarely drinks alcohol. Her stepson operates [employer]. She has no children of her own. She is retired from [employer]. She has shown and  bred dogs for many years.  Family history: Father died at age 71 with hypertension, stroke, peptic ulcer disease and prostate cancer. Mother with history of stroke, atrial fibrillation, hypertension, valve replacement. Mother with history of rheumatic fever. One brother and one sister in good health.    Review of Systems main complaint is fatigue     Objective:   Physical Exam  Constitutional: She appears well-developed and well-nourished. No distress.  HENT:  Head: Normocephalic and atraumatic.  Right Ear: External ear normal.  Left Ear: External ear normal.  Mouth/Throat: Oropharynx is clear and moist. No oropharyngeal exudate.  Eyes: Conjunctivae and EOM are normal. Pupils are equal, round, and reactive to light. Right eye exhibits no discharge. Left eye exhibits no discharge. No scleral icterus.  Neck: Neck supple. No JVD present. No thyromegaly present.  Cardiovascular: Normal rate and normal heart sounds.   No murmur heard. Pulmonary/Chest: She has no wheezes. She has no rales.  Breasts normal female  Abdominal: Soft. Bowel sounds are normal. She exhibits no distension and no mass. There is no tenderness. There is no rebound and no guarding.  Genitourinary:  Genitourinary Comments: Deferred due to age. Last pap 2014  Musculoskeletal: She exhibits no edema.  Lymphadenopathy:    She has no cervical adenopathy.  Neurological: No cranial nerve deficit. Coordination normal.  Skin: Skin is warm and dry. No rash noted. She is not diaphoretic.  Psychiatric: She has a normal mood and affect. Her behavior is normal. Judgment and thought content normal.  dysthymic  Vitals reviewed.         Assessment & Plan:  Elevated BP  Essential hypertension  Reported dependent edema  History of depression and dysthymia  Rheumatoid arthritis  Fatigue  Hyperlipidemia  History of adenomatous colon polyps  GE reflux  Back pain  History of paroxysmal supraventricular  tachycardia  Plan: B-12 level checked and is within normal limits. Triglycerides are increased but she has been on steroids recently for flare of rheumatoid arthritis. We can repeat panel once she is off steroids. She is due for pneumococcal 23. We will plan to see her again in 4 weeks.  Subjective:   Patient presents for Medicare Annual/Subsequent preventive examination.  Review Past Medical/Family/Social:   Risk Factors  Current exercise habits: Sedentary Dietary issues discussed: Low fat low carbohydrate  Cardiac risk factors:Hypertension, hyperlipidemia, family history  Depression Screen  (Note: if answer to either of the following is "Yes", a more complete depression screening is indicated)   Over the past two weeks, have you felt down, depressed or hopeless?  Over the past two weeks, have you felt little interest or pleasure in doing things? No Have you lost interest or pleasure in daily life? No Do you often feel hopeless? No Do you cry easily over simple problems?   Activities of Daily Living  In your present state of health, do you have any difficulty performing the following activities?:   Driving? No  Managing money? No  Feeding yourself? No  Getting from bed to chair? No  Climbing a flight of stairs? yes Preparing food and eating?: No  Bathing or showering? No  Getting dressed: No  Getting to the toilet? No  Using the toilet:No  Moving around from place to place: No  In the past year have you fallen or had a near fall?:yes Are you sexually active? No  Do you have more than one partner? No   Hearing Difficulties: No  Do you often ask people to speak up or repeat themselves? No  Do you experience ringing or noises in your ears? No  Do you have difficulty understanding soft or whispered voices? No  Do you feel that you have a problem with memory? No Do you often misplace items? No    Home Safety:  Do you have a smoke alarm at your residence? Yes Do you  have grab bars in the bathroom? yes Do you have throw rugs in your house? no   Cognitive Testing  Alert? Yes Normal Appearance?Yes  Oriented to person? Yes Place? Yes  Time? Yes  Recall of three objects? Yes  Can perform simple calculations? Yes  Displays appropriate judgment?Yes  Can read the correct time from a watch face?Yes   List the Names of Other Physician/Practitioners you currently use:  See referral list for the physicians patient is currently seeing.     Review of Systems:   Objective:     General appearance: Appears stated age and mildly obese  Head: Normocephalic, without obvious abnormality, atraumatic  Eyes: conj clear, EOMi PEERLA  Ears: normal TM's and external ear canals both ears  Nose: Nares normal. Septum midline. Mucosa normal. No drainage or sinus tenderness.  Throat: lips, mucosa, and tongue normal; teeth and gums normal  Neck: no adenopathy, no carotid bruit, no JVD, supple, symmetrical, trachea midline and thyroid not enlarged, symmetric, no tenderness/mass/nodules  No CVA tenderness.  Lungs: clear to auscultation bilaterally  Breasts: normal appearance, no masses or tenderness,  Heart: regular rate and rhythm, S1, S2 normal, no murmur, click, rub or gallop  Abdomen: soft, non-tender; bowel sounds normal; no masses, no organomegaly  Musculoskeletal:  ROM normal in all joints, no crepitus, no deformity, Normal muscle strengthen. Back  is symmetric, no curvature. Skin: Skin color, texture, turgor normal. No rashes or lesions  Lymph nodes: Cervical, supraclavicular, and axillary nodes normal.  Neurologic: CN 2 -12 Normal, Normal symmetric reflexes. Normal coordination and gait  Psych: Alert & Oriented x 3, Mood appear stable.    Assessment:    Annual wellness medicare exam   Plan:    During the course of the visit the patient was educated and counseled about appropriate screening and preventive services including:   Annual flu  vaccine  Pneumococcal 23  Annual mammogram  Needs to check with Dr. Cristina Gong about colonoscopy     Patient Instructions (the written plan) was given to the patient.  Medicare Attestation  I have personally reviewed:  The patient's medical and social history  Their use of alcohol, tobacco or illicit drugs  Their current medications and supplements  The patient's functional ability including ADLs,fall risks, home safety risks, cognitive, and hearing and visual impairment  Diet and physical activities  Evidence for depression or mood disorders  The patient's weight, height, BMI, and visual acuity have been recorded in the chart. I have made referrals, counseling, and provided education to the patient based on review of the above and I have provided the patient with a written personalized care plan for preventive services.

## 2016-02-10 ENCOUNTER — Encounter: Payer: Self-pay | Admitting: Internal Medicine

## 2016-02-10 DIAGNOSIS — Z1231 Encounter for screening mammogram for malignant neoplasm of breast: Secondary | ICD-10-CM | POA: Diagnosis not present

## 2016-02-10 DIAGNOSIS — J3081 Allergic rhinitis due to animal (cat) (dog) hair and dander: Secondary | ICD-10-CM | POA: Diagnosis not present

## 2016-02-10 DIAGNOSIS — M85852 Other specified disorders of bone density and structure, left thigh: Secondary | ICD-10-CM | POA: Diagnosis not present

## 2016-02-10 DIAGNOSIS — Z780 Asymptomatic menopausal state: Secondary | ICD-10-CM | POA: Diagnosis not present

## 2016-02-11 ENCOUNTER — Other Ambulatory Visit: Payer: Self-pay | Admitting: Allergy and Immunology

## 2016-02-11 DIAGNOSIS — J3089 Other allergic rhinitis: Secondary | ICD-10-CM | POA: Diagnosis not present

## 2016-02-11 NOTE — Progress Notes (Unsigned)
Vials to be made 02-10-17  jm

## 2016-02-14 NOTE — Patient Instructions (Signed)
Reevaluate in 4 weeks. Should be off prednisone at that time and can further assess edema, hyperlipidemia and other issues

## 2016-02-16 ENCOUNTER — Telehealth: Payer: Self-pay | Admitting: Internal Medicine

## 2016-02-16 ENCOUNTER — Telehealth: Payer: Self-pay | Admitting: Allergy and Immunology

## 2016-02-16 NOTE — Telephone Encounter (Signed)
Left message to call the office.

## 2016-02-16 NOTE — Telephone Encounter (Signed)
Please inform Vanessa Harmon that using Memory Dance is an insurance issue and lets try to see if her insurance company will pay for her Dulera 200 - 2 inhalations twice a day to replace her Breo. If she has problems with Dulera then we need to petition for getting insurance approval for her previous Symbicort use.

## 2016-02-16 NOTE — Telephone Encounter (Signed)
She wanted to let you know that the Memory Dance is causing her to have severe sore throat, runny nose, and post nasal drip. She says these are side effects of it so she stopped it 2 days ago and has shown improvement. She wants to know what she needs to do. Does she need to do something different or leave it for now.

## 2016-02-16 NOTE — Telephone Encounter (Signed)
Spoke with patient and she will take half tab starting tomorrow.

## 2016-02-16 NOTE — Telephone Encounter (Signed)
On her last visit here, you started her on Atenolol.  Told her to take it a couple of hours after Losartan.  States she then feels "dreadful for hours".  Her heart rate is running about 40.  States she hasn't taken it today.  Functionally, she is just not there.  She is wondering if it would be possible to cut this dosage in half.  If she remembers correctly, we stopped the Beta Blocker in the past on her.  She just wanted to run this past you and get your feedback.    Best # for contact:  [phone]

## 2016-02-16 NOTE — Telephone Encounter (Signed)
Ask her to cut in half

## 2016-02-17 ENCOUNTER — Ambulatory Visit: Payer: Medicare Other

## 2016-02-17 ENCOUNTER — Ambulatory Visit (INDEPENDENT_AMBULATORY_CARE_PROVIDER_SITE_OTHER): Payer: Medicare Other

## 2016-02-17 DIAGNOSIS — J309 Allergic rhinitis, unspecified: Secondary | ICD-10-CM

## 2016-02-17 NOTE — Progress Notes (Signed)
Immunotherapy   Patient Details  Name: Vanessa Harmon MRN: [id number] Date of Birth: 08-09-1950  02/17/2016  [name] started injections for  Dog and Mold Following schedule: B  Frequency:1-2 per week Epi-Pen:Epi-Pen Available   Consent signed and patient instructions given.   [name] 02/17/2016, 3:33 PM

## 2016-02-17 NOTE — Telephone Encounter (Signed)
Spoke to patient and she is going to use the [facility] samples that she has at home while we do a PA with her insurance  company

## 2016-02-19 ENCOUNTER — Ambulatory Visit (INDEPENDENT_AMBULATORY_CARE_PROVIDER_SITE_OTHER): Payer: Medicare Other

## 2016-02-19 DIAGNOSIS — J309 Allergic rhinitis, unspecified: Secondary | ICD-10-CM

## 2016-02-23 ENCOUNTER — Ambulatory Visit (INDEPENDENT_AMBULATORY_CARE_PROVIDER_SITE_OTHER): Payer: Medicare Other

## 2016-02-23 DIAGNOSIS — J309 Allergic rhinitis, unspecified: Secondary | ICD-10-CM

## 2016-02-24 ENCOUNTER — Ambulatory Visit: Payer: Self-pay | Admitting: *Deleted

## 2016-02-29 ENCOUNTER — Other Ambulatory Visit: Payer: Self-pay | Admitting: Internal Medicine

## 2016-03-03 ENCOUNTER — Ambulatory Visit: Payer: Medicare Other | Admitting: Internal Medicine

## 2016-03-07 ENCOUNTER — Ambulatory Visit (INDEPENDENT_AMBULATORY_CARE_PROVIDER_SITE_OTHER): Payer: Medicare Other | Admitting: Internal Medicine

## 2016-03-07 ENCOUNTER — Encounter: Payer: Self-pay | Admitting: Internal Medicine

## 2016-03-07 ENCOUNTER — Ambulatory Visit (INDEPENDENT_AMBULATORY_CARE_PROVIDER_SITE_OTHER): Payer: Medicare Other | Admitting: *Deleted

## 2016-03-07 VITALS — BP 168/98 | HR 41 | Temp 97.9°F | Ht 66.0 in | Wt 179.0 lb

## 2016-03-07 DIAGNOSIS — M1990 Unspecified osteoarthritis, unspecified site: Secondary | ICD-10-CM

## 2016-03-07 DIAGNOSIS — Z8659 Personal history of other mental and behavioral disorders: Secondary | ICD-10-CM

## 2016-03-07 DIAGNOSIS — M150 Primary generalized (osteo)arthritis: Secondary | ICD-10-CM | POA: Diagnosis not present

## 2016-03-07 DIAGNOSIS — R7989 Other specified abnormal findings of blood chemistry: Secondary | ICD-10-CM | POA: Diagnosis not present

## 2016-03-07 DIAGNOSIS — I10 Essential (primary) hypertension: Secondary | ICD-10-CM

## 2016-03-07 DIAGNOSIS — M5136 Other intervertebral disc degeneration, lumbar region: Secondary | ICD-10-CM | POA: Diagnosis not present

## 2016-03-07 DIAGNOSIS — J309 Allergic rhinitis, unspecified: Secondary | ICD-10-CM

## 2016-03-07 DIAGNOSIS — M797 Fibromyalgia: Secondary | ICD-10-CM | POA: Diagnosis not present

## 2016-03-07 DIAGNOSIS — Z7952 Long term (current) use of systemic steroids: Secondary | ICD-10-CM | POA: Diagnosis not present

## 2016-03-07 MED ORDER — LORAZEPAM 1 MG PO TABS: ORAL_TABLET | ORAL | 5 refills | Status: DC

## 2016-03-07 MED ORDER — LOSARTAN POTASSIUM 100 MG PO TABS: 100.0000 mg | ORAL_TABLET | Freq: Every day | ORAL | 3 refills | Status: DC

## 2016-03-07 MED ORDER — FUROSEMIDE 20 MG PO TABS
20.0000 mg | ORAL_TABLET | Freq: Every day | ORAL | 3 refills | Status: DC
Start: 2016-03-07 — End: 2016-06-15

## 2016-03-07 NOTE — Patient Instructions (Signed)
Discontinue losartan HCTZ. Start Lasix 20 mg daily. Discontinue Tenormin. Take losartan 100 mg daily and call with blood pressure check in 10 days.  Options include Bystolic or amlodipine

## 2016-03-07 NOTE — Progress Notes (Signed)
   Subjective:    Patient ID: Vanessa Harmon, female    DOB: 08-23-1950, 65 y.o.   MRN: [id number]  HPI  Here today to follow-up on elevated blood pressure. At last visit we tried her on Tenormin. She called back and said that she was bradycardic. Subsequently reduced Tenormin to one half tablet daily. Says she is still bradycardic at times. At one point we tried her on amlodipine but it caused edema. He did work well for her aside from the edema. Patient says she's getting edema now. She saw nurse practitioner at rheumatology office recently and was told she had fibromyalgia. Suggested she might want to try massage therapy. In addition to having inflammatory arthritis she finds this to be upsetting. He indicated to her that was not a lot of good treatment options. She has not been able to tolerate Cymbalta. I think she could be a candidate for Lyrica if she has a lot of pain. Her prednisone dose is been cut back to 2.5 mg daily.  Says by late afternoon blood pressures generally in the 120 range. She doesn't take her antihypertensive medication until 10 AM.    Review of Systems see above     Objective:   Physical Exam  Spent 15-20 minutes talking with her about these issues Blood pressure rechecked was 140/90. Initially on arrival blood pressure was 168/98     Assessment & Plan:  Essential HTN  Inflammatory arthritis  Fibromyalgia  Depression  Plan: We are going to discontinue losartan HCTZ. She will take losartan 100 mg daily and Lasix 20 mg daily. We will stop beta blocker. She will call with blood pressure report in 10 days. If she stays on Lasix, we will need to repeat potassium. Options are Bystolic and low-dose amlodipine. If she is on Lasix she may not get dependent edema. We'll await her call in 10 days and discuss further options at that time.

## 2016-03-09 ENCOUNTER — Ambulatory Visit (INDEPENDENT_AMBULATORY_CARE_PROVIDER_SITE_OTHER): Payer: Medicare Other | Admitting: *Deleted

## 2016-03-09 DIAGNOSIS — J309 Allergic rhinitis, unspecified: Secondary | ICD-10-CM

## 2016-03-14 ENCOUNTER — Telehealth: Payer: Self-pay

## 2016-03-14 MED ORDER — BUDESONIDE-FORMOTEROL FUMARATE 160-4.5 MCG/ACT IN AERO: 2.0000 | INHALATION_SPRAY | Freq: Two times a day (BID) | RESPIRATORY_TRACT | 5 refills | Status: DC

## 2016-03-14 NOTE — Telephone Encounter (Signed)
Spoke to patient advised as written below. Verbalized understanding

## 2016-03-14 NOTE — Telephone Encounter (Signed)
Patient seen on 01/27/16 by Dr. Neldon Mc. Patient stated she is having some issues with taking Singulair & Dulera. Patient stated the Singulair is causing her to have some Depression and suicidal thoughts. Also the [facility] is Causing hypertension and Hoarsness. She would like to talk with Dr. Neldon Mc about trying Symbicort. Patient has discontinued taking these medications.  Please Advise

## 2016-03-14 NOTE — Telephone Encounter (Signed)
Please have patient start Symbicort 160 - 2 inhalations twice a day with spacer and discontinue all Dulera and all Singulair

## 2016-03-15 ENCOUNTER — Ambulatory Visit (INDEPENDENT_AMBULATORY_CARE_PROVIDER_SITE_OTHER): Payer: Medicare Other | Admitting: *Deleted

## 2016-03-15 DIAGNOSIS — J309 Allergic rhinitis, unspecified: Secondary | ICD-10-CM

## 2016-03-18 ENCOUNTER — Ambulatory Visit (INDEPENDENT_AMBULATORY_CARE_PROVIDER_SITE_OTHER): Payer: Medicare Other

## 2016-03-18 ENCOUNTER — Other Ambulatory Visit: Payer: Self-pay | Admitting: Internal Medicine

## 2016-03-18 DIAGNOSIS — J309 Allergic rhinitis, unspecified: Secondary | ICD-10-CM | POA: Diagnosis not present

## 2016-03-18 DIAGNOSIS — I159 Secondary hypertension, unspecified: Secondary | ICD-10-CM

## 2016-03-21 ENCOUNTER — Encounter: Payer: Self-pay | Admitting: Internal Medicine

## 2016-03-21 ENCOUNTER — Telehealth: Payer: Self-pay | Admitting: Internal Medicine

## 2016-03-21 NOTE — Telephone Encounter (Signed)
Patient brought by multiple blood pressure readings from November 24 through December 1. Patient still having fluctuations in blood pressure. Blood pressure seems best in the late evenings. Systolic blood pressures are ranging from 123XX123 and diastolic blood pressures are ranging from 69-104. Pulse has been 52-70.  I'm going to recommend that patient see cardiology regarding fluctuating blood pressure. We've had issues getting stabilized. She is currently on losartan 100 mg daily, Lasix 20 mg daily, atenolol 25 mg daily. She has not tolerated Norvasc in the past due to dependent edema.

## 2016-03-22 ENCOUNTER — Ambulatory Visit (INDEPENDENT_AMBULATORY_CARE_PROVIDER_SITE_OTHER): Payer: Medicare Other | Admitting: *Deleted

## 2016-03-22 DIAGNOSIS — J309 Allergic rhinitis, unspecified: Secondary | ICD-10-CM | POA: Diagnosis not present

## 2016-03-29 ENCOUNTER — Ambulatory Visit (INDEPENDENT_AMBULATORY_CARE_PROVIDER_SITE_OTHER): Payer: Medicare Other | Admitting: *Deleted

## 2016-03-29 DIAGNOSIS — J309 Allergic rhinitis, unspecified: Secondary | ICD-10-CM | POA: Diagnosis not present

## 2016-04-04 ENCOUNTER — Ambulatory Visit (INDEPENDENT_AMBULATORY_CARE_PROVIDER_SITE_OTHER): Payer: Medicare Other | Admitting: *Deleted

## 2016-04-04 DIAGNOSIS — J309 Allergic rhinitis, unspecified: Secondary | ICD-10-CM | POA: Diagnosis not present

## 2016-04-08 ENCOUNTER — Ambulatory Visit (INDEPENDENT_AMBULATORY_CARE_PROVIDER_SITE_OTHER): Payer: Medicare Other

## 2016-04-08 DIAGNOSIS — J309 Allergic rhinitis, unspecified: Secondary | ICD-10-CM | POA: Diagnosis not present

## 2016-04-12 ENCOUNTER — Other Ambulatory Visit: Payer: Self-pay | Admitting: Internal Medicine

## 2016-04-12 NOTE — Telephone Encounter (Signed)
Please call  Her and see if Rheumatologist wants her to stay on this

## 2016-04-13 ENCOUNTER — Ambulatory Visit (INDEPENDENT_AMBULATORY_CARE_PROVIDER_SITE_OTHER): Payer: Medicare Other | Admitting: *Deleted

## 2016-04-13 DIAGNOSIS — J309 Allergic rhinitis, unspecified: Secondary | ICD-10-CM | POA: Diagnosis not present

## 2016-04-15 ENCOUNTER — Ambulatory Visit (INDEPENDENT_AMBULATORY_CARE_PROVIDER_SITE_OTHER): Payer: Medicare Other

## 2016-04-15 DIAGNOSIS — J309 Allergic rhinitis, unspecified: Secondary | ICD-10-CM

## 2016-04-18 HISTORY — PX: BLEPHAROPLASTY: SUR158

## 2016-04-19 ENCOUNTER — Ambulatory Visit: Payer: Self-pay | Admitting: *Deleted

## 2016-04-21 ENCOUNTER — Ambulatory Visit (INDEPENDENT_AMBULATORY_CARE_PROVIDER_SITE_OTHER): Payer: Medicare Other | Admitting: Internal Medicine

## 2016-04-21 ENCOUNTER — Encounter: Payer: Self-pay | Admitting: Internal Medicine

## 2016-04-21 VITALS — BP 136/76 | HR 61 | Temp 98.0°F | Resp 16 | Wt 183.0 lb

## 2016-04-21 DIAGNOSIS — R05 Cough: Secondary | ICD-10-CM

## 2016-04-21 DIAGNOSIS — J0100 Acute maxillary sinusitis, unspecified: Secondary | ICD-10-CM

## 2016-04-21 DIAGNOSIS — I10 Essential (primary) hypertension: Secondary | ICD-10-CM | POA: Diagnosis not present

## 2016-04-21 DIAGNOSIS — R059 Cough, unspecified: Secondary | ICD-10-CM

## 2016-04-21 MED ORDER — CLARITHROMYCIN 500 MG PO TABS: 500.0000 mg | ORAL_TABLET | Freq: Two times a day (BID) | ORAL | 0 refills | Status: DC

## 2016-04-21 MED ORDER — METHYLPREDNISOLONE ACETATE 80 MG/ML IJ SUSP
80.0000 mg | Freq: Once | INTRAMUSCULAR | Status: AC
  Administered 2016-04-21: 80 mg via INTRAMUSCULAR

## 2016-04-21 NOTE — Progress Notes (Signed)
   Subjective:    Patient ID: Vanessa Harmon, female    DOB: [birth year], 66 y.o.   MRN: [id number]  HPI Two-week history of URI symptoms with A sinus pressure and some cough. Main complaint is maxillary sinus pressure and congestion. Wonders if Symbicort could be making matters worse. She will discuss with allergist.  Has appointment with cardiology in the near future.  No documented fever or shaking chills. No myalgias.    Review of Systems see above     Objective:   Physical Exam She sounds nasally congested when she speaks. TMs are slightly full bilaterally. Pharynx very slightly injected. Neck is supple. Chest clear to auscultation without rales wheezing or rhonchi. Transillumination of frontal sinuses shows no blockages       Assessment & Plan:  Acute maxillary sinusitis  Essential hypertension-to see cardiologist about labile hypertension soon. Blood pressure today is 136/76  Plan: Biaxin 500 mg twice daily for 10 days. Depo-Medrol 80 mg IM. She is no longer on prednisone per rheumatologist.

## 2016-04-21 NOTE — Patient Instructions (Signed)
Depo-Medrol 80 mg IM. Biaxin 500 mg twice daily for 10 days. Follow-up with cardiologist regarding essential hypertension that is labile.

## 2016-04-22 ENCOUNTER — Other Ambulatory Visit: Payer: Self-pay | Admitting: Internal Medicine

## 2016-04-26 NOTE — Progress Notes (Signed)
Order(s) created erroneously. Erroneous order ID: [id number]  Order moved by: Mardee Postin A  Order move date/time: 04/26/2016 2:10 PM  Source Patient: [id number]  Source Contact: 04/21/2016  Destination Patient: [id number]  Destination Contact: 07/03/2012

## 2016-04-28 ENCOUNTER — Other Ambulatory Visit: Payer: Self-pay | Admitting: Internal Medicine

## 2016-05-03 ENCOUNTER — Ambulatory Visit (INDEPENDENT_AMBULATORY_CARE_PROVIDER_SITE_OTHER): Payer: Medicare Other | Admitting: *Deleted

## 2016-05-03 DIAGNOSIS — J309 Allergic rhinitis, unspecified: Secondary | ICD-10-CM

## 2016-05-06 ENCOUNTER — Ambulatory Visit (INDEPENDENT_AMBULATORY_CARE_PROVIDER_SITE_OTHER): Payer: Medicare Other | Admitting: *Deleted

## 2016-05-06 DIAGNOSIS — J309 Allergic rhinitis, unspecified: Secondary | ICD-10-CM

## 2016-05-10 ENCOUNTER — Ambulatory Visit (INDEPENDENT_AMBULATORY_CARE_PROVIDER_SITE_OTHER): Payer: Medicare Other | Admitting: Allergy and Immunology

## 2016-05-10 ENCOUNTER — Encounter: Payer: Self-pay | Admitting: Allergy and Immunology

## 2016-05-10 VITALS — BP 145/85 | HR 68 | Temp 98.2°F | Resp 16 | Ht 66.5 in | Wt 182.0 lb

## 2016-05-10 DIAGNOSIS — J309 Allergic rhinitis, unspecified: Secondary | ICD-10-CM

## 2016-05-10 DIAGNOSIS — J0100 Acute maxillary sinusitis, unspecified: Secondary | ICD-10-CM

## 2016-05-10 DIAGNOSIS — J45901 Unspecified asthma with (acute) exacerbation: Secondary | ICD-10-CM

## 2016-05-10 DIAGNOSIS — J019 Acute sinusitis, unspecified: Secondary | ICD-10-CM | POA: Insufficient documentation

## 2016-05-10 MED ORDER — BECLOMETHASONE DIPROPIONATE 80 MCG/ACT NA AERS: 2.0000 | INHALATION_SPRAY | NASAL | 5 refills | Status: DC

## 2016-05-10 MED ORDER — PREDNISONE 1 MG PO TABS: 10.0000 mg | ORAL_TABLET | Freq: Every day | ORAL | Status: DC

## 2016-05-10 NOTE — Assessment & Plan Note (Addendum)
   Continue appropriate allergen avoidance measures, azelastine nasal spray, and nasal saline irrigation.  A prescription has been provided for Qnasl (as above).

## 2016-05-10 NOTE — Progress Notes (Signed)
Follow-up Note  RE: Vanessa Harmon MRN: [id number] DOB: [birth year] Date of Office Visit: 05/10/2016  Primary care provider: Elby Showers, MD Referring provider: Elby Showers, MD  History of present illness: Vanessa Harmon is a 66 y.o. female with persistent asthma, allergic rhinitis, and regular from [place] reflux presenting today for sick visit.  She was last seen in this clinic in October 2017.  She reports that approximately 4 weeks ago she began to develop a "low-grade sinus infection".  On January 4 she received a Depo-Medrol injection and ten-day course of Biaxin.  Her symptoms improved for a few days after Depo-Medrol injection, however the nasal congestion, maxillary sinus pressure, left ear pressure, postnasal drainage, and coughing have progressed over the past 2 weeks.  She denies fevers, chills, or foul mucus production.  She is currently using azelastine nasal spray, fluticasone nasal spray, and nasal saline irrigation.  She had to discontinue Breo Ellipta because of laryngitis.  She was started instead on Symbicort 160-4.5 g, 2 inhalations via spacer device twice a day.    Assessment and plan: Asthma with acute exacerbation  Prednisone has been provided, 50 mg x2 days, 40 mg x 2 days, 20 mg x2 days, 10 mg x1 day, then stop.  Continue Symbicort 160-4 0.5 g, 2 inhalations via spacer device twice a day, and albuterol HFA, 1-2 inhalations every 4-6 hours as needed.  The patient has been asked to contact me if her symptoms persist or progress. Otherwise, she may return for follow up in 4 months.  Acute sinusitis  Prednisone has been provided (as above).  Add guaifenesin [phone] mg (Mucinex)  twice daily as needed with adequate hydration as discussed.  Continue nasal saline lavage and azelastine nasal spray.  A sample and prescription have been provided for Qnasl 80 g, one actuation per nostril twice daily as needed.  Proper technique has been discussed and  demonstrated.  The patient has been asked to contact me if her symptoms persist, progress, or if she becomes febrile.   Allergic rhinitis  Continue appropriate allergen avoidance measures, azelastine nasal spray, and nasal saline irrigation.  A prescription has been provided for Qnasl (as above).   Diagnostics: Spirometry reveals an FVC of 3.23 L (104% predicted) and an FEV1 of 1.89 L (77% predicted) with significant (510 mL, 27%) postbronchodilator improvement.  Moderate airways obstruction with significant reversibility.  Please see scanned spirometry results for details.    Physical examination: Blood pressure (!) 145/85, pulse 68, temperature 98.2 F (36.8 C), temperature source Oral, resp. rate 16, height 5' 6.5" (1.689 m), weight 182 lb (82.6 kg), SpO2 96 %.  General: Alert, interactive, in no acute distress. HEENT: TMs pearly gray, turbinates edematous with thick discharge, post-pharynx erythematous. Neck: Supple without lymphadenopathy. Lungs: Mildly decreased breath sounds bilaterally without wheezing, rhonchi or rales. CV: Normal S1, S2 without murmurs. Skin: Warm and dry, without lesions or rashes.  The following portions of the patient's history were reviewed and updated as appropriate: allergies, current medications, past family history, past medical history, past social history, past surgical history and problem list.  Allergies as of 05/10/2016      Reactions   Breo Ellipta [fluticasone Furoate-vilanterol]    unknown   Dulera [mometasone Furo-formoterol Fum]    Speech impairment   Singulair [montelukast Sodium] Other (See Comments)   Suicide thoughts   Erythromycin Nausea Only   Tape Rash   PAPER TAPE: Causes severe rash   Tetracyclines & Related Nausea Only  Medication List       Accurate as of 05/10/16  8:26 PM. Always use your most recent med list.          aspirin 81 MG tablet Take 81 mg by mouth daily.   atorvastatin 80 MG tablet Commonly  known as:  LIPITOR TAKE 1/2 BY MOUTH ONCE DAILY   azelastine 0.1 % nasal spray Commonly known as:  ASTELIN INSTILL 1 SPRAY INTO EACH NOSTRIL TWICE DAILY   Beclomethasone Dipropionate 80 MCG/ACT Aers Commonly known as:  QNASL Place 2 sprays into both nostrils 1 day or 1 dose.   budesonide-formoterol 160-4.5 MCG/ACT inhaler Commonly known as:  SYMBICORT Inhale 2 puffs into the lungs 2 (two) times daily.   CALCIUM + D PO Take by mouth 2 (two) times daily.   dextromethorphan-guaiFENesin 30-600 MG 12hr tablet Commonly known as:  MUCINEX DM Take 1 tablet by mouth daily as needed for cough.   escitalopram 20 MG tablet Commonly known as:  LEXAPRO TAKE 1 TABLET BY MOUTH DAILY   FIBER CHOICE PO Take 1 capsule by mouth daily.   fish oil-omega-3 fatty acids 1000 MG capsule Take 1 g by mouth 2 (two) times daily.   fluticasone 50 MCG/ACT nasal spray Commonly known as:  FLONASE INSTILL TWO   SPRAYS INTO EACH NOSTRIL EVERY DAY   folic acid 1 MG tablet Commonly known as:  FOLVITE Take 1 mg by mouth daily.   furosemide 20 MG tablet Commonly known as:  LASIX Take 1 tablet (20 mg total) by mouth daily.   indomethacin 75 MG CR capsule Commonly known as:  INDOCIN SR TAKE 1 CAPSULE BY MOUTH DAILY   LORazepam 1 MG tablet Commonly known as:  ATIVAN TAKE ONE TABLET   BY MOUTH   DAILY IN THE MORNING AND TWO   TABLET BY MOUTH   AT BEDTIME   losartan 100 MG tablet Commonly known as:  COZAAR Take 1 tablet (100 mg total) by mouth daily.   methotrexate 25 MG/ML injection Inject 25 mg into the skin once a week. 15 mg=0.8 ml  Every Wednesday   multivitamin capsule Take 1 capsule by mouth daily.   omeprazole 40 MG capsule Commonly known as:  PRILOSEC Take 1 capsule (40 mg total) by mouth daily.   PROAIR HFA 108 (90 Base) MCG/ACT inhaler Generic drug:  albuterol Inhale 2 puffs into the lungs every 4 (four) hours as needed for wheezing or shortness of breath.   PROBIOTIC DAILY  PO Take 1 capsule by mouth daily.   ranitidine 300 MG tablet Commonly known as:  ZANTAC Take 1 tablet (300 mg total) by mouth at bedtime.   STOOL SOFTENER PO Take 1 capsule by mouth as needed.   vitamin C 1000 MG tablet Take 1,000 mg by mouth daily.       Allergies  Allergen Reactions  . Breo Ellipta [Fluticasone Furoate-Vilanterol]     unknown  . Dulera [Mometasone Furo-Formoterol Fum]     Speech impairment  . Singulair [Montelukast Sodium] Other (See Comments)    Suicide thoughts  . Erythromycin Nausea Only  . Tape Rash    PAPER TAPE: Causes severe rash  . Tetracyclines & Related Nausea Only   Review of systems: Review of systems negative except as noted in HPI / PMHx or noted below: Constitutional: Negative.  HENT: Negative.   Eyes: Negative.  Respiratory: Negative.   Cardiovascular: Negative.  Gastrointestinal: Negative.  Genitourinary: Negative.  Musculoskeletal: Negative.  Neurological: Negative.  Endo/Heme/Allergies: Negative.  Cutaneous: Negative.  Past Medical History:  Diagnosis Date  . Adenomatous polyp   . Anxiety    takes Ativan daily as needed  . Arthritis    inflammatory arthritis (Dr. Dagoberto Ligas)  . Chronic back pain    scoliosis   . Constipation    takes miralax every other day  . Depression    takes Lexapro daily  . Essential hypertension, benign    takes Hyzaar and Metoprolol daily  . History of blood transfusion   . History of bronchitis 5 rys ago  . History of colon polyps   . History of shingles   . Hyperlipidemia    takes Atorvastatin daily  . Insomnia    takes Melatonin nightly  . Joint pain   . Pneumonia as a child  . PSVT (paroxysmal supraventricular tachycardia) ([place])   . Rhinitis, allergic    uses Flonase daily  . Urinary urgency     Family History  Problem Relation Age of Onset  . Cancer Mother   . Stroke Mother     Social History   Social History  . Marital status: Married    Spouse name: N/A  .  Number of children: N/A  . Years of education: N/A   Occupational History  . Not on file.   Social History Main Topics  . Smoking status: Former Research scientist (life sciences)  . Smokeless tobacco: Never Used     Comment: hasn't smoked in 77yrs  . Alcohol use Yes     Comment: glass or 2 of wine nightly  . Drug use: No  . Sexual activity: Yes    Birth control/ protection: Post-menopausal   Other Topics Concern  . Not on file   Social History Narrative  . No narrative on file    I appreciate the opportunity to take part in Amaryah's care. Please do not hesitate to contact me with questions.  Sincerely,   R. Edgar Frisk, MD

## 2016-05-10 NOTE — Assessment & Plan Note (Addendum)
   Prednisone has been provided (as above).  Add guaifenesin [phone] mg (Mucinex)  twice daily as needed with adequate hydration as discussed.  Continue nasal saline lavage and azelastine nasal spray.  A sample and prescription have been provided for Qnasl 80 g, one actuation per nostril twice daily as needed.  Proper technique has been discussed and demonstrated.  The patient has been asked to contact me if her symptoms persist, progress, or if she becomes febrile.

## 2016-05-10 NOTE — Patient Instructions (Addendum)
Asthma with acute exacerbation  Prednisone has been provided, 50 mg x2 days, 40 mg x 2 days, 20 mg x2 days, 10 mg x1 day, then stop.  Continue Symbicort 160-4 0.5 g, 2 inhalations via spacer device twice a day, and albuterol HFA, 1-2 inhalations every 4-6 hours as needed.  The patient has been asked to contact me if her symptoms persist or progress. Otherwise, she may return for follow up in 4 months.  Acute sinusitis  Prednisone has been provided (as above).  Add guaifenesin [phone] mg (Mucinex)  twice daily as needed with adequate hydration as discussed.  Continue nasal saline lavage and azelastine nasal spray.  A sample and prescription have been provided for Qnasl 80 g, one actuation per nostril twice daily as needed.  Proper technique has been discussed and demonstrated.  The patient has been asked to contact me if her symptoms persist, progress, or if she becomes febrile.   Allergic rhinitis  Continue appropriate allergen avoidance measures, azelastine nasal spray, and nasal saline irrigation.  A prescription has been provided for Qnasl (as above).   Return in about 4 months (around 09/07/2016), or if symptoms worsen or fail to improve.

## 2016-05-10 NOTE — Assessment & Plan Note (Signed)
   Prednisone has been provided, 50 mg x2 days, 40 mg x 2 days, 20 mg x2 days, 10 mg x1 day, then stop.  Continue Symbicort 160-4 0.5 g, 2 inhalations via spacer device twice a day, and albuterol HFA, 1-2 inhalations every 4-6 hours as needed.  The patient has been asked to contact me if her symptoms persist or progress. Otherwise, she may return for follow up in 4 months.

## 2016-05-11 ENCOUNTER — Ambulatory Visit (INDEPENDENT_AMBULATORY_CARE_PROVIDER_SITE_OTHER): Payer: Medicare Other | Admitting: Cardiology

## 2016-05-11 ENCOUNTER — Encounter: Payer: Self-pay | Admitting: Cardiology

## 2016-05-11 ENCOUNTER — Encounter (INDEPENDENT_AMBULATORY_CARE_PROVIDER_SITE_OTHER): Payer: Self-pay

## 2016-05-11 VITALS — BP 170/100 | HR 55 | Ht 66.5 in | Wt 181.0 lb

## 2016-05-11 DIAGNOSIS — I10 Essential (primary) hypertension: Secondary | ICD-10-CM

## 2016-05-11 MED ORDER — DOXAZOSIN MESYLATE 1 MG PO TABS: 1.0000 mg | ORAL_TABLET | Freq: Every day | ORAL | 11 refills | Status: DC

## 2016-05-11 NOTE — Progress Notes (Signed)
Cardiology Office Note    Date:  05/11/2016   ID:  Vanessa Harmon, DOB 1950/07/07, MRN [id number]  PCP:  Elby Showers, MD  Cardiologist:  Fransico Him, MD   Chief Complaint  Patient presents with  . Hypertension    History of Present Illness:  Vanessa Harmon is a 66 y.o. female with history of poorly controlled BP, PSVT who presents today for evaluation of poorly controlled HTN.  She currently is on Cozaar and Lasix.  She was on what sounds like amlodipine in the past which caused edema and had to stop it.  She was on Tenormin which caused bradycardia.  This also happened with Cardizem. She denies any chest pain or pressure, dizziness, palpitations, syncope, PND, orthopnea or claudication.  She has some mild chronic SOB that is related to asthma.  She smoked remotely years ago.     Past Medical History:  Diagnosis Date  . Adenomatous polyp   . Anxiety    takes Ativan daily as needed  . Arthritis    inflammatory arthritis (Dr. Dagoberto Ligas)  . Chronic back pain    scoliosis   . Constipation    takes miralax every other day  . Depression    takes Lexapro daily  . Essential hypertension, benign    takes Hyzaar and Metoprolol daily  . History of blood transfusion   . History of bronchitis 5 rys ago  . History of colon polyps   . History of shingles   . Hyperlipidemia    takes Atorvastatin daily  . Insomnia    takes Melatonin nightly  . Joint pain   . Pneumonia as a child  . PSVT (paroxysmal supraventricular tachycardia) ([place])   . Rhinitis, allergic    uses Flonase daily  . Urinary urgency     Past Surgical History:  Procedure Laterality Date  . APPENDECTOMY    . BACK SURGERY  2012   at age 14 d/t scoliosis  . BREAST BIOPSY    . cataract surgery    . COLONOSCOPY    . KNEE ARTHROSCOPY Bilateral   . neuroma removed from foot     unsure of which foot  . SHOULDER SURGERY Right   . TONSILLECTOMY    . TOTAL HIP ARTHROPLASTY Right 04/15/2014   Procedure: RIGHT TOTAL HIP ARTHROPLASTY ANTERIOR APPROACH;  Surgeon: Renette Butters, MD;  Location: [facility];  Service: Orthopedics;  Laterality: Right;    Current Medications: Outpatient Medications Prior to Visit  Medication Sig Dispense Refill  . albuterol (PROAIR HFA) 108 (90 Base) MCG/ACT inhaler Inhale 2 puffs into the lungs every 4 (four) hours as needed for wheezing or shortness of breath.    . Ascorbic Acid (VITAMIN C) 1000 MG tablet Take 1,000 mg by mouth daily.      Marland Kitchen aspirin 81 MG tablet Take 81 mg by mouth daily.    Marland Kitchen atorvastatin (LIPITOR) 80 MG tablet TAKE 1/2 BY MOUTH ONCE DAILY 90 tablet 1  . azelastine (ASTELIN) 0.1 % nasal spray INSTILL 1 SPRAY INTO EACH NOSTRIL TWICE DAILY 30 mL prn  . Beclomethasone Dipropionate (QNASL) 80 MCG/ACT AERS Place 2 sprays into both nostrils 1 day or 1 dose. 8.7 g 5  . budesonide-formoterol (SYMBICORT) 160-4.5 MCG/ACT inhaler Inhale 2 puffs into the lungs 2 (two) times daily. 1 Inhaler 5  . Calcium Carbonate-Vitamin D (CALCIUM + D PO) Take by mouth 2 (two) times daily.      Marland Kitchen dextromethorphan-guaiFENesin ([place] DM) 30-600 MG  12hr tablet Take 1 tablet by mouth daily as needed for cough.    Vanessa Harmon Calcium (STOOL SOFTENER PO) Take 1 capsule by mouth as needed.    Marland Kitchen escitalopram (LEXAPRO) 20 MG tablet TAKE 1 TABLET BY MOUTH DAILY 30 tablet 11  . fish oil-omega-3 fatty acids 1000 MG capsule Take 1 g by mouth 2 (two) times daily.      . fluticasone (FLONASE) 50 MCG/ACT nasal spray INSTILL TWO   SPRAYS INTO EACH NOSTRIL EVERY DAY 16 g prn  . folic acid (FOLVITE) 1 MG tablet Take 1 mg by mouth daily.    . furosemide (LASIX) 20 MG tablet Take 1 tablet (20 mg total) by mouth daily. 30 tablet 3  . indomethacin (INDOCIN SR) 75 MG CR capsule TAKE 1 CAPSULE BY MOUTH DAILY 30 capsule 5  . Inulin (FIBER CHOICE PO) Take 1 capsule by mouth daily.    Marland Kitchen LORazepam (ATIVAN) 1 MG tablet TAKE ONE TABLET   BY MOUTH   DAILY IN THE MORNING AND TWO   TABLET BY MOUTH    AT BEDTIME 90 tablet 5  . losartan (COZAAR) 100 MG tablet Take 1 tablet (100 mg total) by mouth daily. 30 tablet 3  . methotrexate 25 MG/ML injection Inject 25 mg into the skin once a week. 15 mg=0.8 ml  Every Wednesday    . Multiple Vitamin (MULTIVITAMIN) capsule Take 1 capsule by mouth daily.    Marland Kitchen omeprazole (PRILOSEC) 40 MG capsule Take 1 capsule (40 mg total) by mouth daily. 30 capsule 5  . Probiotic Product (PROBIOTIC DAILY PO) Take 1 capsule by mouth daily.    . ranitidine (ZANTAC) 300 MG tablet Take 1 tablet (300 mg total) by mouth at bedtime. 30 tablet 5   Facility-Administered Medications Prior to Visit  Medication Dose Route Frequency Provider Last Rate Last Dose  . predniSONE (DELTASONE) tablet 10 mg  10 mg Oral Q breakfast Adelina Mings, MD         Allergies:   Breo ellipta [fluticasone furoate-vilanterol]; Dulera [mometasone furo-formoterol fum]; Singulair [montelukast sodium]; Erythromycin; Tape; and Tetracyclines & related   Social History   Social History  . Marital status: Married    Spouse name: N/A  . Number of children: N/A  . Years of education: N/A   Social History Main Topics  . Smoking status: Former Research scientist (life sciences)  . Smokeless tobacco: Never Used     Comment: hasn't smoked in 53yrs  . Alcohol use Yes     Comment: glass or 2 of wine nightly  . Drug use: No  . Sexual activity: Yes    Birth control/ protection: Post-menopausal   Other Topics Concern  . None   Social History Narrative  . None     Family History:  The patient's family history includes Cancer in her mother; Stroke in her mother.   ROS:   Please see the history of present illness.    ROS All other systems reviewed and are negative.  No flowsheet data found.     PHYSICAL EXAM:   VS:  BP (!) 170/100   Pulse (!) 55   Ht 5' 6.5" (1.689 m)   Wt 181 lb (82.1 kg)   SpO2 98%   BMI 28.78 kg/m    GEN: Well nourished, well developed, in no acute distress  HEENT: normal  Neck: no JVD,  carotid bruits, or masses Cardiac: RRR; no murmurs, rubs, or gallops,no edema.  Intact distal pulses bilaterally.  Respiratory:  clear  to auscultation bilaterally, normal work of breathing GI: soft, nontender, nondistended, + BS MS: no deformity or atrophy  Skin: warm and dry, no rash Neuro:  Alert and Oriented x 3, Strength and sensation are intact Psych: euthymic mood, full affect  Wt Readings from Last 3 Encounters:  05/11/16 181 lb (82.1 kg)  05/10/16 182 lb (82.6 kg)  04/21/16 183 lb (83 kg)      Studies/Labs Reviewed:   EKG:  EKG is ordered today.  The ekg ordered today demonstrates sinus bradycardia at 52bpm with no ST changes  Recent Labs: 02/01/2016: ALT 51; BUN 13; Creat 0.80; Hemoglobin 13.8; Platelets 252; Potassium 4.2; Sodium 140; TSH 2.36   Lipid Panel    Component Value Date/Time   CHOL 234 (H) 02/01/2016 1011   TRIG 306 (H) 02/01/2016 1011   HDL 55 02/01/2016 1011   CHOLHDL 4.3 02/01/2016 1011   VLDL 61 (H) 02/01/2016 1011   LDLCALC 118 02/01/2016 1011    Additional studies/ records that were reviewed today include:  Office visit notes from PCP    ASSESSMENT:    1. Essential hypertension      PLAN:  In order of problems listed above:  1. HTN - BP has not been well controlled in the past due to multiple drug intolerances.  She is intolerant to amlodipine due to LE edema and cannot use BB or CCB due to bradycardia.  I am going to continue her on ARB and diuretic and try doxazosin .  I have asked her to check her BP daily 2 hours after her am meds for a week and bring them with her to her appt in HTN clinic to see if her doxazosin needs to be uptitrated.    Medication Adjustments/Labs and Tests Ordered: Current medicines are reviewed at length with the patient today.  Concerns regarding medicines are outlined above.  Medication changes, Labs and Tests ordered today are listed in the Patient Instructions below.  There are no Patient Instructions on  file for this visit.   Signed, Fransico Him, MD  05/11/2016 2:37 PM    [address], [place], [place]  [address] Phone: [phone]; Fax: [phone]

## 2016-05-11 NOTE — Patient Instructions (Signed)
Medication Instructions:  1) START DOXAZOSIN 1 mg EVERY NIGHT  Labwork: None  Testing/Procedures: None  Follow-Up: Your physician recommends that you schedule a follow-up appointment in [place] in the HTN CLINIC.  Your physician wants you to follow-up in: 6 months with Dr. Radford Pax. You will receive a reminder letter in the mail two months in advance. If you don't receive a letter, please call our office to schedule the follow-up appointment.   Any Other Special Instructions Will Be Listed Below (If Applicable).     If you need a refill on your cardiac medications before your next appointment, please call your pharmacy.

## 2016-05-13 ENCOUNTER — Other Ambulatory Visit: Payer: Self-pay

## 2016-05-13 DIAGNOSIS — J309 Allergic rhinitis, unspecified: Secondary | ICD-10-CM

## 2016-05-13 DIAGNOSIS — J0100 Acute maxillary sinusitis, unspecified: Secondary | ICD-10-CM

## 2016-05-13 MED ORDER — FLUNISOLIDE 25 MCG/ACT (0.025%) NA SOLN: 2.0000 | Freq: Two times a day (BID) | NASAL | 5 refills | Status: DC

## 2016-05-18 ENCOUNTER — Telehealth: Payer: Self-pay | Admitting: Allergy and Immunology

## 2016-05-18 ENCOUNTER — Encounter: Payer: Self-pay | Admitting: Pharmacist

## 2016-05-18 ENCOUNTER — Other Ambulatory Visit: Payer: Self-pay | Admitting: *Deleted

## 2016-05-18 ENCOUNTER — Ambulatory Visit (INDEPENDENT_AMBULATORY_CARE_PROVIDER_SITE_OTHER): Payer: Medicare Other | Admitting: Pharmacist

## 2016-05-18 VITALS — BP 156/88 | HR 53 | Wt 184.0 lb

## 2016-05-18 DIAGNOSIS — R7989 Other specified abnormal findings of blood chemistry: Secondary | ICD-10-CM | POA: Diagnosis not present

## 2016-05-18 DIAGNOSIS — M150 Primary generalized (osteo)arthritis: Secondary | ICD-10-CM | POA: Diagnosis not present

## 2016-05-18 DIAGNOSIS — I10 Essential (primary) hypertension: Secondary | ICD-10-CM

## 2016-05-18 DIAGNOSIS — Z79899 Other long term (current) drug therapy: Secondary | ICD-10-CM | POA: Diagnosis not present

## 2016-05-18 DIAGNOSIS — M5136 Other intervertebral disc degeneration, lumbar region: Secondary | ICD-10-CM | POA: Diagnosis not present

## 2016-05-18 DIAGNOSIS — M1990 Unspecified osteoarthritis, unspecified site: Secondary | ICD-10-CM | POA: Diagnosis not present

## 2016-05-18 MED ORDER — OLMESARTAN MEDOXOMIL 40 MG PO TABS: 40.0000 mg | ORAL_TABLET | Freq: Every day | ORAL | 2 refills | Status: DC

## 2016-05-18 MED ORDER — AMOXICILLIN-POT CLAVULANATE 875-125 MG PO TABS: ORAL_TABLET | ORAL | 0 refills | Status: DC

## 2016-05-18 MED ORDER — PREDNISONE 10 MG PO TABS: ORAL_TABLET | ORAL | 0 refills | Status: DC

## 2016-05-18 NOTE — Telephone Encounter (Signed)
Prednisone: 20 mg x 4 days, 10 mg x1 day, then stop. Prescription Augmentin 875-125, bid x 14 days.

## 2016-05-18 NOTE — Progress Notes (Signed)
Patient ID: [name]                 DOB: [birth year]                      MRN: [id number]     HPI: Vanessa Harmon is a 66 y.o. female patient of Dr. Radford Pax with PMH below who presents today for hypertension evaluation. She was seen last week and started on doxazosin for elevated blood pressure.   She presents today and states that she has been lightheaded somewhat since starting the medication. She is unsure if her allergies could be contributing. She states the first day she took doxazosin she slept the entire next day. Today she is somewhat lightheaded and feels poorly.   She also states she has been under a lot of stress over the last year as her dog, whom is very dear to her, has been doing poorly.    Cardiac Hx: HTN, PSVT, HLD  Current HTN meds:  Losartan 100mg  daily Furosemide 20mg  daily Doxazosin 1mg  daily  Previously tried:  Amlodipine?? - LE edema Cardizem -  bradycardia Atenolol - bradycardia  BP goal: <130/80  Home BP readings: She reports measurements mostly in the 140-160s/90s. She had one measurement of 129/77, but most of the measurements have remained elevated.   Wt Readings from Last 3 Encounters:  05/18/16 184 lb (83.5 kg)  05/11/16 181 lb (82.1 kg)  05/10/16 182 lb (82.6 kg)   BP Readings from Last 3 Encounters:  05/18/16 (!) 156/88  05/11/16 (!) 170/100  05/10/16 (!) 145/85   Pulse Readings from Last 3 Encounters:  05/18/16 (!) 53  05/11/16 (!) 55  05/10/16 68    Renal function: CrCl cannot be calculated (Patient's most recent lab result is older than the maximum 21 days allowed.).  Past Medical History:  Diagnosis Date  . Adenomatous polyp   . Anxiety    takes Ativan daily as needed  . Arthritis    inflammatory arthritis (Dr. Dagoberto Harmon)  . Chronic back pain    scoliosis   . Constipation    takes miralax every other day  . Depression    takes Lexapro daily  . Essential hypertension, benign    takes Hyzaar and Metoprolol  daily  . History of blood transfusion   . History of bronchitis 5 rys ago  . History of colon polyps   . History of shingles   . Hyperlipidemia    takes Atorvastatin daily  . Insomnia    takes Melatonin nightly  . Joint pain   . Pneumonia as a child  . PSVT (paroxysmal supraventricular tachycardia) ([place])   . Rhinitis, allergic    uses Flonase daily  . Urinary urgency     Current Outpatient Prescriptions on File Prior to Visit  Medication Sig Dispense Refill  . albuterol (PROAIR HFA) 108 (90 Base) MCG/ACT inhaler Inhale 2 puffs into the lungs every 4 (four) hours as needed for wheezing or shortness of breath.    . Ascorbic Acid (VITAMIN C) 1000 MG tablet Take 1,000 mg by mouth daily.      Marland Kitchen aspirin 81 MG tablet Take 81 mg by mouth daily.    Marland Kitchen atorvastatin (LIPITOR) 80 MG tablet TAKE 1/2 BY MOUTH ONCE DAILY 90 tablet 1  . azelastine (ASTELIN) 0.1 % nasal spray INSTILL 1 SPRAY INTO EACH NOSTRIL TWICE DAILY 30 mL prn  . budesonide-formoterol (SYMBICORT) 160-4.5 MCG/ACT inhaler Inhale 2 puffs into  the lungs 2 (two) times daily. 1 Inhaler 5  . Calcium Carbonate-Vitamin D (CALCIUM + D PO) Take by mouth 2 (two) times daily.      Marland Kitchen dextromethorphan-guaiFENesin (MUCINEX DM) 30-600 MG 12hr tablet Take 1 tablet by mouth daily as needed for cough.    Mariane Baumgarten Calcium (STOOL SOFTENER PO) Take 1 capsule by mouth as needed.    Marland Kitchen escitalopram (LEXAPRO) 20 MG tablet TAKE 1 TABLET BY MOUTH DAILY 30 tablet 11  . fish oil-omega-3 fatty acids 1000 MG capsule Take 1 g by mouth 2 (two) times daily.      . fluticasone (FLONASE) 50 MCG/ACT nasal spray INSTILL TWO   SPRAYS INTO EACH NOSTRIL EVERY DAY 16 g prn  . folic acid (FOLVITE) 1 MG tablet Take 1 mg by mouth daily.    . furosemide (LASIX) 20 MG tablet Take 1 tablet (20 mg total) by mouth daily. 30 tablet 3  . indomethacin (INDOCIN SR) 75 MG CR capsule TAKE 1 CAPSULE BY MOUTH DAILY 30 capsule 5  . Inulin (FIBER CHOICE PO) Take 1 capsule by mouth daily.     Marland Kitchen LORazepam (ATIVAN) 1 MG tablet TAKE ONE TABLET   BY MOUTH   DAILY IN THE MORNING AND TWO   TABLET BY MOUTH   AT BEDTIME 90 tablet 5  . methotrexate 25 MG/ML injection Inject 25 mg into the skin once a week. 15 mg=0.8 ml  Every Wednesday    . Multiple Vitamin (MULTIVITAMIN) capsule Take 1 capsule by mouth daily.    Marland Kitchen omeprazole (PRILOSEC) 40 MG capsule Take 1 capsule (40 mg total) by mouth daily. 30 capsule 5  . Probiotic Product (PROBIOTIC DAILY PO) Take 1 capsule by mouth daily.     Current Facility-Administered Medications on File Prior to Visit  Medication Dose Route Frequency Provider Last Rate Last Dose  . predniSONE (DELTASONE) tablet 10 mg  10 mg Oral Q breakfast Adelina Mings, MD        Allergies  Allergen Reactions  . Breo Ellipta [Fluticasone Furoate-Vilanterol]     unknown  . Dulera [Mometasone Furo-Formoterol Fum]     Speech impairment  . Singulair [Montelukast Sodium] Other (See Comments)    Suicide thoughts  . Erythromycin Nausea Only  . Tape Rash    PAPER TAPE: Causes severe rash  . Tetracyclines & Related Nausea Only    Blood pressure (!) 156/88, pulse (!) 53, weight 184 lb (83.5 kg).   Assessment/Plan: Hypertension: BP remains elevated. Will discontinue doxazosin due to lightheadedness and discontinue her losartan and start a more potent ARB. Start olmesartan 40mg  daily. Have asked she continue to monitor her pressures and follow up in 4 weeks. If pressure elevated at that time will plan to start spironolactone. Patient agreeable to this plan.    Thank you, Vanessa Harmon, [facility]  05/18/2016 2:24 PM

## 2016-05-18 NOTE — Progress Notes (Signed)
Thank you. It has been difficult to find something she could tolerate and work for her. Sounds like good plan.

## 2016-05-18 NOTE — Telephone Encounter (Signed)
Left detailed message on personal voicemail informing patient of Prednisone and Augmentin. ERx sent.

## 2016-05-18 NOTE — Telephone Encounter (Signed)
Pt caleed and said that she came in and was seen by you and she is worse with her sinus pressure and wants to know what to do. [place]. [phone].

## 2016-05-18 NOTE — Patient Instructions (Addendum)
Return for a follow up appointment in 4 weeks  Check your blood pressure at home daily (if able) and keep record of the readings.  Take your BP meds as follows: STOP doxazosin STOP losartan   START olmesartan 40mg  daily   Bring all of your meds, your BP cuff and your record of home blood pressures to your next appointment.  Exercise as you're able, try to walk approximately 30 minutes per day.  Keep salt intake to a minimum, especially watch canned and prepared boxed foods.  Eat more fresh fruits and vegetables and fewer canned items.  Avoid eating in fast food restaurants.    HOW TO TAKE YOUR BLOOD PRESSURE: . Rest 5 minutes before taking your blood pressure. .  Don't smoke or drink caffeinated beverages for at least 30 minutes before. . Take your blood pressure before (not after) you eat. . Sit comfortably with your back supported and both feet on the floor (don't cross your legs). . Elevate your arm to heart level on a table or a desk. . Use the proper sized cuff. It should fit smoothly and snugly around your bare upper arm. There should be enough room to slip a fingertip under the cuff. The bottom edge of the cuff should be 1 inch above the crease of the elbow. . Ideally, take 3 measurements at one sitting and record the average.

## 2016-05-24 ENCOUNTER — Ambulatory Visit (INDEPENDENT_AMBULATORY_CARE_PROVIDER_SITE_OTHER): Payer: Medicare Other

## 2016-05-24 DIAGNOSIS — J309 Allergic rhinitis, unspecified: Secondary | ICD-10-CM

## 2016-05-26 ENCOUNTER — Ambulatory Visit (INDEPENDENT_AMBULATORY_CARE_PROVIDER_SITE_OTHER): Payer: Medicare Other

## 2016-05-26 ENCOUNTER — Telehealth: Payer: Self-pay | Admitting: *Deleted

## 2016-05-26 DIAGNOSIS — J309 Allergic rhinitis, unspecified: Secondary | ICD-10-CM | POA: Diagnosis not present

## 2016-05-26 NOTE — Telephone Encounter (Signed)
Please informally in that I do not think that we can legally do that. When she returns after a three-week hiatus-year-old increase her dosing quickly.

## 2016-05-26 NOTE — Telephone Encounter (Signed)
Left message to inform her that she can not take her shots with her.

## 2016-05-26 NOTE — Telephone Encounter (Signed)
Patient is going out of town for 3 weeks in May and she did not want to miss any of her shots. She is a retired Therapist, sports and was wondering if she could take her shots with her for those 3 weeks and will return to getting shots in the office after her vacation. Please advise.

## 2016-05-27 ENCOUNTER — Other Ambulatory Visit: Payer: Self-pay | Admitting: Internal Medicine

## 2016-06-01 ENCOUNTER — Telehealth: Payer: Self-pay | Admitting: Allergy and Immunology

## 2016-06-01 ENCOUNTER — Telehealth: Payer: Self-pay | Admitting: Pharmacist

## 2016-06-01 MED ORDER — VALSARTAN 320 MG PO TABS
320.0000 mg | ORAL_TABLET | Freq: Every day | ORAL | 0 refills | Status: DC
Start: 2016-06-01 — End: 2016-06-28

## 2016-06-01 NOTE — Telephone Encounter (Signed)
As she has failed multiple rounds of antibiotics and steroids, please refer her to ENT for  further evaluation and treatment.  For now, please provide her with prednisone, 40 mg x3 days, 20 mg x1 day, 10 mg x1 day, then stop. Thanks.

## 2016-06-01 NOTE — Telephone Encounter (Signed)
Please advise 

## 2016-06-01 NOTE — Telephone Encounter (Signed)
Patient calling today and states that she has had some hoarseness that she associated with the olmesartan. She reports that her pressure has been running about the same as always and is elevated. Will stop olmesartan since she is convinced that this is causing her hoarseness. She does not wish to go back to losartan as she feels it has no effect on her blood pressure. Will try valsartan 320mg  daily and have her keep follow up as scheduled for 06/15/16. Pt states understanding and appreciation.

## 2016-06-01 NOTE — Telephone Encounter (Signed)
Patient called and said she has a sinus infection. Left side of face and left ear hurt; bloody nasal drainage. She just finished antibiotics for this, today and they didn't work. Would like to know what else could be called in.

## 2016-06-02 MED ORDER — PREDNISONE 10 MG PO TABS: ORAL_TABLET | ORAL | 0 refills | Status: DC

## 2016-06-02 NOTE — Telephone Encounter (Signed)
Noted. Thanks.

## 2016-06-02 NOTE — Telephone Encounter (Signed)
Called patient and informed her that we sent in Prednisone to her pharmacy, and went over directions. I also informed her that we were going to referral to an ENT and patient stated she has been seen by an ENT.

## 2016-06-03 ENCOUNTER — Ambulatory Visit (INDEPENDENT_AMBULATORY_CARE_PROVIDER_SITE_OTHER): Payer: Medicare Other

## 2016-06-03 DIAGNOSIS — J309 Allergic rhinitis, unspecified: Secondary | ICD-10-CM

## 2016-06-08 ENCOUNTER — Ambulatory Visit (INDEPENDENT_AMBULATORY_CARE_PROVIDER_SITE_OTHER): Payer: Medicare Other

## 2016-06-08 DIAGNOSIS — J309 Allergic rhinitis, unspecified: Secondary | ICD-10-CM | POA: Diagnosis not present

## 2016-06-10 ENCOUNTER — Ambulatory Visit (INDEPENDENT_AMBULATORY_CARE_PROVIDER_SITE_OTHER): Payer: Medicare Other

## 2016-06-10 DIAGNOSIS — J309 Allergic rhinitis, unspecified: Secondary | ICD-10-CM

## 2016-06-15 ENCOUNTER — Ambulatory Visit (INDEPENDENT_AMBULATORY_CARE_PROVIDER_SITE_OTHER): Payer: Medicare Other | Admitting: Pharmacist

## 2016-06-15 ENCOUNTER — Ambulatory Visit (INDEPENDENT_AMBULATORY_CARE_PROVIDER_SITE_OTHER): Payer: Medicare Other | Admitting: *Deleted

## 2016-06-15 VITALS — BP 146/88 | HR 51

## 2016-06-15 DIAGNOSIS — R7989 Other specified abnormal findings of blood chemistry: Secondary | ICD-10-CM | POA: Diagnosis not present

## 2016-06-15 DIAGNOSIS — I10 Essential (primary) hypertension: Secondary | ICD-10-CM | POA: Diagnosis not present

## 2016-06-15 DIAGNOSIS — J309 Allergic rhinitis, unspecified: Secondary | ICD-10-CM | POA: Diagnosis not present

## 2016-06-15 DIAGNOSIS — Z7952 Long term (current) use of systemic steroids: Secondary | ICD-10-CM | POA: Diagnosis not present

## 2016-06-15 MED ORDER — CHLORTHALIDONE 25 MG PO TABS: 25.0000 mg | ORAL_TABLET | Freq: Every day | ORAL | 11 refills | Status: DC

## 2016-06-15 NOTE — Progress Notes (Signed)
Patient ID: Vanessa Harmon                 DOB: 02-Dec-1950                      MRN: [id number]     HPI: Vanessa Harmon is a 66 y.o. female patient of Dr. Radford Pax who presents to HTN clinic for follow up. PMH is significant for HTN, HLD, PSVT, asthma, and depression. Pt stopped her doxazosin at last visit due to ligheadedness and was switched from losartan to olmesartan at last visit for better BP lowering. Pt called clinic 2 weeks later stating that her voice was hoarse and attributed this to her olmesartan although it is not a known side effect of the medication. She was switched to valsartan and presents today for follow up.  Pt has some swelling in her ankles. Thinks the valsartan is making it worse although her BP has improved over the past 3 days. Does not think her Lasix is helping with swelling and does not want to increase the dose. Does not wear compression stockings and does not want to.  Pt uses indomethacin daily for her arthritis. She is aware this may be increasing her BP as well but states it is the only med that works for her arthritic pain. She was previously on a prednisone taper for recently diagnosed asthma. She has completed her prednisone course.  Current HTN meds: valsartan 320mg  daily, furosemide 20mg  daily Previously tried: doxazosin - lightheadedness, amlodipine - LE edema, Cardizem -  bradycardia, atenolol - bradycardia, olmesartan - hoarse voice BP goal: <130/88mmHg  Diet: Limits caffeine intake. Drinks a few cups of coffee in the morning.  Exercise: Arthritis, asthma, and fibromyalgia limits activity. Does walk her dog occasionally.  Family history: The patient's family history includes Cancer in her mother; Stroke in her mother.  Social history: Smoked remotely years ago. Drinks a glass of wine nightly, denies illicit drug use.  Home BP readings: She reports measurements mostly in the 150s/80s. Over the past 3 days, they have improved to 130-140s/70s. HR low  50s. Makes sure to check BP a few hours after taking BP medications.  Wt Readings from Last 3 Encounters:  05/18/16 184 lb (83.5 kg)  05/11/16 181 lb (82.1 kg)  05/10/16 182 lb (82.6 kg)   BP Readings from Last 3 Encounters:  05/18/16 (!) 156/88  05/11/16 (!) 170/100  05/10/16 (!) 145/85   Pulse Readings from Last 3 Encounters:  05/18/16 (!) 53  05/11/16 (!) 55  05/10/16 68    Renal function: CrCl cannot be calculated (Patient's most recent lab result is older than the maximum 21 days allowed.).  Past Medical History:  Diagnosis Date  . Adenomatous polyp   . Anxiety    takes Ativan daily as needed  . Arthritis    inflammatory arthritis (Dr. Dagoberto Ligas)  . Chronic back pain    scoliosis   . Constipation    takes miralax every other day  . Depression    takes Lexapro daily  . Essential hypertension, benign    takes Hyzaar and Metoprolol daily  . History of blood transfusion   . History of bronchitis 5 rys ago  . History of colon polyps   . History of shingles   . Hyperlipidemia    takes Atorvastatin daily  . Insomnia    takes Melatonin nightly  . Joint pain   . Pneumonia as a child  . PSVT (  paroxysmal supraventricular tachycardia) ([place])   . Rhinitis, allergic    uses Flonase daily  . Urinary urgency     Current Outpatient Prescriptions on File Prior to Visit  Medication Sig Dispense Refill  . albuterol (PROAIR HFA) 108 (90 Base) MCG/ACT inhaler Inhale 2 puffs into the lungs every 4 (four) hours as needed for wheezing or shortness of breath.    Marland Kitchen amoxicillin-clavulanate (AUGMENTIN) 875-125 MG tablet Take one tablet twice daily for 14 days 28 tablet 0  . Ascorbic Acid (VITAMIN C) 1000 MG tablet Take 1,000 mg by mouth daily.      Marland Kitchen aspirin 81 MG tablet Take 81 mg by mouth daily.    Marland Kitchen atorvastatin (LIPITOR) 80 MG tablet TAKE 1/2 BY MOUTH ONCE DAILY 90 tablet 1  . azelastine (ASTELIN) 0.1 % nasal spray INSTILL 1 SPRAY INTO EACH NOSTRIL TWICE DAILY 30 mL prn    . budesonide-formoterol (SYMBICORT) 160-4.5 MCG/ACT inhaler Inhale 2 puffs into the lungs 2 (two) times daily. 1 Inhaler 5  . Calcium Carbonate-Vitamin D (CALCIUM + D PO) Take by mouth 2 (two) times daily.      Marland Kitchen dextromethorphan-guaiFENesin (MUCINEX DM) 30-600 MG 12hr tablet Take 1 tablet by mouth daily as needed for cough.    Mariane Baumgarten Calcium (STOOL SOFTENER PO) Take 1 capsule by mouth as needed.    Marland Kitchen escitalopram (LEXAPRO) 20 MG tablet TAKE 1 TABLET BY MOUTH DAILY 30 tablet 11  . fish oil-omega-3 fatty acids 1000 MG capsule Take 1 g by mouth 2 (two) times daily.      . fluticasone (FLONASE) 50 MCG/ACT nasal spray INSTILL TWO   SPRAYS INTO EACH NOSTRIL EVERY DAY 16 g prn  . folic acid (FOLVITE) 1 MG tablet Take 1 mg by mouth daily.    . furosemide (LASIX) 20 MG tablet Take 1 tablet (20 mg total) by mouth daily. 30 tablet 3  . indomethacin (INDOCIN SR) 75 MG CR capsule TAKE 1 CAPSULE BY MOUTH DAILY 30 capsule 5  . Inulin (FIBER CHOICE PO) Take 1 capsule by mouth daily.    Marland Kitchen LORazepam (ATIVAN) 1 MG tablet TAKE ONE TABLET   BY MOUTH   DAILY IN THE MORNING AND TWO   TABLET BY MOUTH   AT BEDTIME 90 tablet 5  . methotrexate 25 MG/ML injection Inject 25 mg into the skin once a week. 15 mg=0.8 ml  Every Wednesday    . Multiple Vitamin (MULTIVITAMIN) capsule Take 1 capsule by mouth daily.    Marland Kitchen omeprazole (PRILOSEC) 40 MG capsule Take 1 capsule (40 mg total) by mouth daily. 30 capsule 5  . predniSONE (DELTASONE) 10 MG tablet Take two tablets once daily for 4 days then one tablet on day 5 9 tablet 0  . predniSONE (DELTASONE) 10 MG tablet 4 tablets daily for days 1-3, 2 tablets on day 4, and 1 tablet on day 5. 15 tablet 0  . Probiotic Product (PROBIOTIC DAILY PO) Take 1 capsule by mouth daily.    . ranitidine (ZANTAC) 150 MG tablet Take 150 mg by mouth 2 (two) times daily.    . valsartan (DIOVAN) 320 MG tablet Take 1 tablet (320 mg total) by mouth daily. 30 tablet 0   Current Facility-Administered  Medications on File Prior to Visit  Medication Dose Route Frequency Provider Last Rate Last Dose  . predniSONE (DELTASONE) tablet 10 mg  10 mg Oral Q breakfast Adelina Mings, MD        Allergies  Allergen Reactions  .  Breo Ellipta [Fluticasone Furoate-Vilanterol]     unknown  . Dulera [Mometasone Furo-Formoterol Fum]     Speech impairment  . Singulair [Montelukast Sodium] Other (See Comments)    Suicide thoughts  . Erythromycin Nausea Only  . Tape Rash    PAPER TAPE: Causes severe rash  . Tetracyclines & Related Nausea Only     Assessment/Plan:  1. Hypertension - BP slightly improved but still above goal <130/30mmHg. Pt still having some edema in her ankles. Pt states her Lasix isn't working but does not want to increase the dose or wear compression stockings. Will stop Lasix and start chlorthalidone 25mg  daily for better BP control and some fluid control. Will continue valsartan 320mg  daily. Encouraged pt to elevate her legs in the evening and go for more frequent walks. Follow up BP check and BMET in 2 weeks.   Megan E. Supple, PharmD, CPP, [place] [id number], [place], [place] [address] Phone: [phone]; Fax: [phone] 06/15/2016 12:24 PM

## 2016-06-15 NOTE — Patient Instructions (Addendum)
Stop taking Lasix  Start taking chlorthalidone 25mg  once a day  Continue taking valsartan 320mg  daily  Elevate your legs in the evening  Follow up in clinic in 2 weeks

## 2016-06-21 ENCOUNTER — Other Ambulatory Visit: Payer: Self-pay | Admitting: Internal Medicine

## 2016-06-23 ENCOUNTER — Ambulatory Visit (INDEPENDENT_AMBULATORY_CARE_PROVIDER_SITE_OTHER): Payer: Medicare Other

## 2016-06-23 DIAGNOSIS — J309 Allergic rhinitis, unspecified: Secondary | ICD-10-CM

## 2016-06-28 ENCOUNTER — Ambulatory Visit (INDEPENDENT_AMBULATORY_CARE_PROVIDER_SITE_OTHER): Payer: Medicare Other | Admitting: Pharmacist

## 2016-06-28 ENCOUNTER — Other Ambulatory Visit (INDEPENDENT_AMBULATORY_CARE_PROVIDER_SITE_OTHER): Payer: Medicare Other

## 2016-06-28 VITALS — BP 128/80 | HR 52

## 2016-06-28 DIAGNOSIS — I10 Essential (primary) hypertension: Secondary | ICD-10-CM | POA: Diagnosis not present

## 2016-06-28 LAB — BASIC METABOLIC PANEL
BUN/Creatinine Ratio: 26 (ref 12–28)
BUN: 24 mg/dL (ref 8–27)
CO2: 26 mmol/L (ref 18–29)
Calcium: 10.3 mg/dL (ref 8.7–10.3)
Chloride: 95 mmol/L — ABNORMAL LOW (ref 96–106)
Creatinine, Ser: 0.92 mg/dL (ref 0.57–1.00)
GFR calc Af Amer: 75 mL/min/{1.73_m2} (ref >59)
GFR calc non Af Amer: 65 mL/min/{1.73_m2} (ref >59)
Glucose: 127 mg/dL — ABNORMAL HIGH (ref 65–99)
Potassium: 4.5 mmol/L (ref 3.5–5.2)
Sodium: 138 mmol/L (ref 134–144)

## 2016-06-28 MED ORDER — VALSARTAN 320 MG PO TABS: 320.0000 mg | ORAL_TABLET | Freq: Every day | ORAL | 11 refills | Status: DC

## 2016-06-28 NOTE — Progress Notes (Signed)
Patient ID: DILAN FULLENWIDER                 DOB: 1951/03/17                      MRN: [id number]     HPI: Vanessa Harmon is a 66 y.o. female patient of Dr. Radford Pax who presents to HTN clinic for follow up. PMH is significant for HTN, HLD, PSVT, asthma, and depression. Previously patient stopped her doxazosin to ligheadedness and switched from losartan to olmesartan for better BP lowering. She reported hoarseness that she believed to be attributed to olmesartan, so she was changed to valsartan, although this is not a known side effect of this medication. At her last appointment 2 weeks ago, she had complaints that her valsartan was increasing her ankle swelling. She did not want to increase her furosemide or wear compression stockings for swelling. Chlorthalidone 25mg  was started. At that time, she stopped taking her furosemide.   Patient presents today a little tired and "foggy", and feels like she has been more tired lately, and napping most of the afternoon. However, she does not feel like this is due to the chlorthalidone, but due to her fibromyalgia and discomfort. She gets up once to multiple times during the night, but not every night, to use the bathroom. She says that this has gotten worse over the past two weeks. She feels like the swelling in her legs is much reduced since starting the chlorthalidone. She does not want to increase her diuretics.   Cardiac Hx: HTN, PSVT, HLD  Current HTN meds: valsartan 320mg  daily, chlorthalidone 25 mg daily  Previously tried: doxazosin - lightheadedness, amlodipine - LE edema, Cardizem - bradycardia, atenolol - bradycardia, olmesartan - hoarse voice  BP goal: <130/5mmHg  Family History: The patient's family history includes Cancer in her mother; Stroke in her mother.  Social History: Smoked remotely years ago. Drinks a glass of wine nightly, denies illicit drug use.  Diet: Limits caffeine intake. Drinks a 1-2 cups of coffee in the morning, and  generally no other caffeine throughout the day. She generally cooks at home more than eating out.   Exercise: Arthritis, asthma, and fibromyalgia limits activity. Does walk her dog occasionally.  Home BP readings: Patient forgot her log, but remembers 121/70-something recently. She says her readings have been in the 130s/upper 80s.52  Wt Readings from Last 3 Encounters:  05/18/16 184 lb (83.5 kg)  05/11/16 181 lb (82.1 kg)  05/10/16 182 lb (82.6 kg)   BP Readings from Last 3 Encounters:  06/28/16 128/80  06/15/16 (!) 146/88  05/18/16 (!) 156/88   Pulse Readings from Last 3 Encounters:  06/28/16 (!) 52  06/15/16 (!) 51  05/18/16 (!) 53    Renal function: CrCl cannot be calculated (Patient's most recent lab result is older than the maximum 21 days allowed.).  Past Medical History:  Diagnosis Date  . Adenomatous polyp   . Anxiety    takes Ativan daily as needed  . Arthritis    inflammatory arthritis (Dr. Dagoberto Ligas)  . Chronic back pain    scoliosis   . Constipation    takes miralax every other day  . Depression    takes Lexapro daily  . Essential hypertension, benign    takes Hyzaar and Metoprolol daily  . History of blood transfusion   . History of bronchitis 5 rys ago  . History of colon polyps   . History of  shingles   . Hyperlipidemia    takes Atorvastatin daily  . Insomnia    takes Melatonin nightly  . Joint pain   . Pneumonia as a child  . PSVT (paroxysmal supraventricular tachycardia) ([place])   . Rhinitis, allergic    uses Flonase daily  . Urinary urgency     Current Outpatient Prescriptions on File Prior to Visit  Medication Sig Dispense Refill  . albuterol (PROAIR HFA) 108 (90 Base) MCG/ACT inhaler Inhale 2 puffs into the lungs every 4 (four) hours as needed for wheezing or shortness of breath.    . Ascorbic Acid (VITAMIN C) 1000 MG tablet Take 1,000 mg by mouth daily.      Marland Kitchen aspirin 81 MG tablet Take 81 mg by mouth daily.    Marland Kitchen atorvastatin  (LIPITOR) 80 MG tablet TAKE 1/2 BY MOUTH ONCE DAILY 90 tablet 1  . azelastine (ASTELIN) 0.1 % nasal spray INSTILL 1 SPRAY INTO EACH NOSTRIL TWICE DAILY 30 mL prn  . budesonide-formoterol (SYMBICORT) 160-4.5 MCG/ACT inhaler Inhale 2 puffs into the lungs 2 (two) times daily. 1 Inhaler 5  . Calcium Carbonate-Vitamin D (CALCIUM + D PO) Take by mouth 2 (two) times daily.      . chlorthalidone (HYGROTON) 25 MG tablet Take 1 tablet (25 mg total) by mouth daily. 30 tablet 11  . dextromethorphan-guaiFENesin (MUCINEX DM) 30-600 MG 12hr tablet Take 1 tablet by mouth daily as needed for cough.    Mariane Baumgarten Calcium (STOOL SOFTENER PO) Take 1 capsule by mouth as needed.    Marland Kitchen escitalopram (LEXAPRO) 20 MG tablet TAKE 1 TABLET BY MOUTH DAILY 30 tablet 11  . fish oil-omega-3 fatty acids 1000 MG capsule Take 1 g by mouth 2 (two) times daily.      . fluticasone (FLONASE) 50 MCG/ACT nasal spray INSTILL TWO   SPRAYS INTO EACH NOSTRIL EVERY DAY 16 g prn  . folic acid (FOLVITE) 1 MG tablet Take 1 mg by mouth daily.    . furosemide (LASIX) 20 MG tablet TAKE 1 TABLET (20 MG TOTAL) BY MOUTH DAILY. 30 tablet 5  . indomethacin (INDOCIN SR) 75 MG CR capsule TAKE 1 CAPSULE BY MOUTH DAILY 30 capsule 5  . Inulin (FIBER CHOICE PO) Take 1 capsule by mouth daily.    Marland Kitchen LORazepam (ATIVAN) 1 MG tablet TAKE ONE TABLET   BY MOUTH   DAILY IN THE MORNING AND TWO   TABLET BY MOUTH   AT BEDTIME 90 tablet 5  . methotrexate 25 MG/ML injection Inject 25 mg into the skin once a week. 15 mg=0.8 ml  Every Wednesday    . Multiple Vitamin (MULTIVITAMIN) capsule Take 1 capsule by mouth daily.    Marland Kitchen omeprazole (PRILOSEC) 40 MG capsule Take 1 capsule (40 mg total) by mouth daily. 30 capsule 5  . Probiotic Product (PROBIOTIC DAILY PO) Take 1 capsule by mouth daily.    . ranitidine (ZANTAC) 150 MG tablet Take 150 mg by mouth 2 (two) times daily.    . valsartan (DIOVAN) 320 MG tablet Take 1 tablet (320 mg total) by mouth daily. 30 tablet 0   Current  Facility-Administered Medications on File Prior to Visit  Medication Dose Route Frequency Provider Last Rate Last Dose  . predniSONE (DELTASONE) tablet 10 mg  10 mg Oral Q breakfast Adelina Mings, MD        Allergies  Allergen Reactions  . Breo Ellipta [Fluticasone Furoate-Vilanterol]     unknown  . Dulera [Mometasone Furo-Formoterol Fum]  Speech impairment  . Singulair [Montelukast Sodium] Other (See Comments)    Suicide thoughts  . Erythromycin Nausea Only  . Tape Rash    PAPER TAPE: Causes severe rash  . Tetracyclines & Related Nausea Only    Blood pressure 128/80, pulse (!) 52, SpO2 97 %.   Assessment/Plan: Hypertension: Blood pressure today is controlled at goal <130/80, and sounds to be similarly controlled at home. 1. As patient is controlled we will continue her regimen today of chlorthalidone 25 mg and valsartan 320 mg.  2. Follow up in 6 weeks.    Patient was seen with Courtney Heys, PharmD Candidate 2018.   Thank you, Lelan Pons. Patterson Hammersmith, [facility]  06/28/2016 11:55 AM

## 2016-06-28 NOTE — Patient Instructions (Signed)
Return for a a follow up appointment in 6 weeks, pending blood work today.   Your blood pressure today is 128/89   Check your blood pressure at home daily (if able) and keep record of the readings.  Take your BP meds as follows: CONTINUE chlorthalidone 25 mg and valsartan 320 mg daily.   Bring all of your meds, your BP cuff and your record of home blood pressures to your next appointment.  Exercise as you're able, try to walk approximately 30 minutes per day.  Keep salt intake to a minimum, especially watch canned and prepared boxed foods.  Eat more fresh fruits and vegetables and fewer canned items.  Avoid eating in fast food restaurants.    HOW TO TAKE YOUR BLOOD PRESSURE: . Rest 5 minutes before taking your blood pressure. .  Don't smoke or drink caffeinated beverages for at least 30 minutes before. . Take your blood pressure before (not after) you eat. . Sit comfortably with your back supported and both feet on the floor (don't cross your legs). . Elevate your arm to heart level on a table or a desk. . Use the proper sized cuff. It should fit smoothly and snugly around your bare upper arm. There should be enough room to slip a fingertip under the cuff. The bottom edge of the cuff should be 1 inch above the crease of the elbow. . Ideally, take 3 measurements at one sitting and record the average.

## 2016-07-01 ENCOUNTER — Ambulatory Visit (INDEPENDENT_AMBULATORY_CARE_PROVIDER_SITE_OTHER): Payer: Medicare Other | Admitting: *Deleted

## 2016-07-01 DIAGNOSIS — J309 Allergic rhinitis, unspecified: Secondary | ICD-10-CM

## 2016-07-07 ENCOUNTER — Other Ambulatory Visit: Payer: Self-pay | Admitting: *Deleted

## 2016-07-07 ENCOUNTER — Encounter: Payer: Self-pay | Admitting: Allergy & Immunology

## 2016-07-07 ENCOUNTER — Ambulatory Visit (INDEPENDENT_AMBULATORY_CARE_PROVIDER_SITE_OTHER): Payer: Medicare Other | Admitting: Allergy & Immunology

## 2016-07-07 VITALS — BP 136/78 | HR 80 | Temp 97.9°F | Resp 14 | Ht 66.0 in | Wt 183.0 lb

## 2016-07-07 DIAGNOSIS — J4540 Moderate persistent asthma, uncomplicated: Secondary | ICD-10-CM

## 2016-07-07 DIAGNOSIS — K219 Gastro-esophageal reflux disease without esophagitis: Secondary | ICD-10-CM

## 2016-07-07 DIAGNOSIS — J019 Acute sinusitis, unspecified: Secondary | ICD-10-CM | POA: Diagnosis not present

## 2016-07-07 DIAGNOSIS — J3081 Allergic rhinitis due to animal (cat) (dog) hair and dander: Secondary | ICD-10-CM | POA: Diagnosis not present

## 2016-07-07 LAB — CBC WITH DIFFERENTIAL/PLATELET
Basophils Absolute: 0 cells/uL (ref 0–200)
Basophils Relative: 0 %
Eosinophils Absolute: 168 cells/uL (ref 15–500)
Eosinophils Relative: 2 %
HCT: 39.9 % (ref 35.0–45.0)
Hemoglobin: 13.4 g/dL (ref 11.7–15.5)
Lymphocytes Relative: 14 %
Lymphs Abs: 1176 cells/uL (ref 850–3900)
MCH: 32.8 pg (ref 27.0–33.0)
MCHC: 33.6 g/dL (ref 32.0–36.0)
MCV: 97.6 fL (ref 80.0–100.0)
MPV: 12.1 fL (ref 7.5–12.5)
Monocytes Absolute: 420 cells/uL (ref 200–950)
Monocytes Relative: 5 %
Neutro Abs: 6636 cells/uL (ref 1500–7800)
Neutrophils Relative %: 79 %
Platelets: 255 10*3/uL (ref 140–400)
RBC: 4.09 MIL/uL (ref 3.80–5.10)
RDW: 14 % (ref 11.0–15.0)
WBC: 8.4 10*3/uL (ref 3.8–10.8)

## 2016-07-07 MED ORDER — RANITIDINE HCL 300 MG PO TABS: ORAL_TABLET | ORAL | 1 refills | Status: DC

## 2016-07-07 MED ORDER — AMOXICILLIN-POT CLAVULANATE 875-125 MG PO TABS: 1.0000 | ORAL_TABLET | Freq: Two times a day (BID) | ORAL | 0 refills | Status: AC

## 2016-07-07 MED ORDER — OMEPRAZOLE 40 MG PO CPDR: DELAYED_RELEASE_CAPSULE | ORAL | 1 refills | Status: DC

## 2016-07-07 NOTE — Progress Notes (Signed)
FOLLOW UP  Date of Service/Encounter:  07/07/16   Assessment:   Moderate persistent asthma, uncomplicated  LPRD (laryngopharyngeal reflux disease)  Acute frontal sinusitis  Chronic allergic rhinitis (pet dander, mold)    Asthma Reportables:  Severity: moderate persistent  Risk: low Control: not well controlled    Plan/Recommendations:   1. Moderate persistent asthma, uncomplicated  - Lung function looks good today.  - We will not make any medication changes today Since I do not want to add anything that might disqualify her from the Benadryl is metastatic. - I did send an email to our research coordinators so that they can screen the patient. - Information on Fasenra (benralizumab) provided today.  - If she does not qualify for the benralizumab study, then I will plan to add Spiriva two inhalations once daily.  - I am also getting a complete blood count today to see if she has an eosinophil count that would qualify her for the medication. - Daily controller medication(s): Symbicort 160/4.5 two puffs twice daily - Rescue medications: ProAir 4 puffs every 4-6 hours as needed - Asthma control goals:  * Full participation in all desired activities (may need albuterol before activity) * Albuterol use two time or less a week on average (not counting use with activity) * Cough interfering with sleep two time or less a month * Oral steroids no more than once a year * No hospitalizations  2. LPRD (laryngopharyngeal reflux disease) - Continue with omeprazole 40mg  daily.  3. Acute sinusitis - Prednisone pack provided.  - Start Augmentin 875mg  twice daily for three weeks. - Continue with Mucinex twice daily. - Continue with nasal saline rinses.  4. Chronic allergic rhinitis (dog, mold) - Continue with allergy shots at the same schedule. - Continue with Flonase + azelastine daily. - Continue with Xyzal 5mg  daily. - Could consider additional testing in the future if the  shots do not provide adequate relief.   5. Return in about 3 months (around 10/07/2016).    Subjective:   MANISHA CANCEL is a 66 y.o. female presenting today for follow up of  Chief Complaint  Patient presents with  . Asthma    would like to revisit her treatment. does not feel as if it is working.     [name] has a history of the following: Patient Active Problem List   Diagnosis Date Noted  . Asthma with acute exacerbation 05/10/2016  . Acute sinusitis 05/10/2016  . Polymyalgia rheumatica ([place]) 02/15/2015  . DJD (degenerative joint disease) 04/15/2014  . Lumbar spine scoliosis 11/28/2013  . Impaired glucose tolerance 12/19/2011  . Lumbar back pain 12/19/2011  . Allergic rhinitis 12/19/2011  . Hyperlipidemia 10/16/2010  . Depression 10/16/2010  . Lumbar spondylosis 10/16/2010  . GE reflux 10/16/2010  . Adenomatous colon polyp 10/16/2010  . History of paroxysmal supraventricular tachycardia 10/16/2010  . Hypertension 10/16/2010    History obtained from: chart review and patient.  [name] was referred by Elby Showers, MD.     Etter is a 66 y.o. female presenting for a follow up visit. She was last seen in January 2018 by Dr. Verlin Fester. At that time, she was diagnosed with an asthma exacerbation. She was given a prednisone taper and treated for a sinus infection. She was on Augmentin for two weeks but did not feel that it totally clear the infection. She was continued on Symbicort 160/4.5 two puffs twice daily. She has a history of allergic rhinitis and is  on allergy shots. She is on fluticasone and azelastine as well as Xyzal. It seems that Qnasl was also prescribed.  Since last visit, she has continued to not do well. She does feel like the Augmentin was not continued for long enough to completely clear her sinus infection. She continues to have bilateral frontal pain which is worsened with nasal discharge. She denies fever. She has had no ocular symptoms  including vision changes, pain without movement, or ocular swelling. She is also not happy about how her asthma is controlled. She is interested in discussing new treatment modalities today.  Asthma/Respiratory Symptom History: Currently she is on Symbicort 160/4.5 two puffs twice daily. She was on Breo initially back in October 2017 which was not covered by her insurance. She also had extreme hoarseness from the medication. She was then put on Symbicort 160/4.5 two puffs twice daily. This does not seem to be helping any longer. She was on Singulair which worked, but she had the psychiatric side effects. Xolair was discussed but 2-3 months ago, however she would have been paying $300 per month for the drug alone. She has needed prednisone multiple times in the past year. However, it should be noted that she has rheumatoid arthritis and does require prednisone burst for flares. She thinks that she was put on it more often for arthritis. Her last course of prednisone was in January 2018 (Dr. Verlin Fester). Mucinex would work for a while but then her cough returns. Prednisone does seem to clear her symptoms completely. She endorses excellent compliance.   Allergic Rhinitis Symptom History: She is on allergy shots (one vial contains Dog and another contains Mold). She cannot tell whether the allergy shots are working at this point. She also has issues with her sinuses and ears. She has pressure bilaterally (L>R). She feels some pain radiating from her ear. She has seen ENT in the past. She is on azelastine and Flonase. She feels that her sinus problems and her asthma are both contributing to her current state.   Otherwise, there have been no changes to her past medical history, surgical history, family history, or social history.    Review of Systems: a 14-point review of systems is pertinent for what is mentioned in HPI.  Otherwise, all other systems were negative. Constitutional: negative other than that listed  in the HPI Eyes: negative other than that listed in the HPI Ears, nose, mouth, throat, and face: negative other than that listed in the HPI Respiratory: negative other than that listed in the HPI Cardiovascular: negative other than that listed in the HPI Gastrointestinal: negative other than that listed in the HPI Genitourinary: negative other than that listed in the HPI Integument: negative other than that listed in the HPI Hematologic: negative other than that listed in the HPI Musculoskeletal: negative other than that listed in the HPI Neurological: negative other than that listed in the HPI Allergy/Immunologic: negative other than that listed in the HPI    Objective:   Blood pressure 136/78, pulse 80, temperature 97.9 F (36.6 C), resp. rate 14, height 5\' 6"  (1.676 m), weight 183 lb (83 kg), SpO2 98 %. Body mass index is 29.54 kg/m.   Physical Exam:  General: Alert, interactive, in no acute distress. Pleasant. Nasal voice.  Eyes: No conjunctival injection present on the right, No conjunctival injection present on the left, PERRL bilaterally, No discharge on the right, No discharge on the left, No Horner-Trantas dots present and allergic shiners present bilaterally Ears: Right TM  pearly gray with normal light reflex, Left TM pearly gray with normal light reflex, Right TM intact without perforation and Left TM intact without perforation.  Nose/Throat: External nose within normal limits, nasal crease present and septum midline, turbinates markedly edematous and pale with clear discharge, post-pharynx erythematous with cobblestoning in the posterior oropharynx. Tonsils 3+ without exudates Neck: Supple without thyromegaly. Lungs: Clear to auscultation without wheezing, rhonchi or rales. No increased work of breathing. CV: Normal S1/S2, no murmurs. Capillary refill <2 seconds.  Skin: Warm and dry, without lesions or rashes. Neuro:   Grossly intact. No focal deficits appreciated.  Responsive to questions.   Diagnostic studies:  Spirometry: results normal (FEV1: 2.07/85%, FVC: 2.98/96%, FEV1/FVC: 69%).    Spirometry consistent with normal pattern.   Allergy Studies: none     Salvatore Marvel, MD [facility]

## 2016-07-07 NOTE — Patient Instructions (Addendum)
1. Moderate persistent asthma, uncomplicated - Lung function looks good today.  - We will not make any medication changes today until the Research Coordinator talks to you about the study drug (benralizumab). - Information on Fasenra (benralizumab) provided today.  - Daily controller medication(s): Symbicort 160/4.5 two puffs twice daily - Rescue medications: ProAir 4 puffs every 4-6 hours as needed - Asthma control goals:  * Full participation in all desired activities (may need albuterol before activity) * Albuterol use two time or less a week on average (not counting use with activity) * Cough interfering with sleep two time or less a month * Oral steroids no more than once a year * No hospitalizations  2. LPRD (laryngopharyngeal reflux disease) - Continue with omeprazole 40mg  daily  3. Acute sinusitis - Prednisone pack provided.  - Start Augmentin 875mg  twice daily for three weeks. - Continue with Mucinex twice daily. - Continue with nasal saline rinses.  4. Chronic allergic rhinitis - Continue with allergy shots at the same schedule. - Continue with Flonase + azelastine daily. - Continue with Xyzal 5mg  daily.  5. Return in about 3 months (around 10/07/2016).  Please inform us of any Emergency Department visits, hospitalizations, or changes in symptoms. Call us before going to the ED for breathing or allergy symptoms since we might be able to fit you in for a sick visit. Feel free to contact us anytime with any questions, problems, or concerns.  It was a pleasure to meet you today! Happy spring!   Websites that have reliable patient information: 1. American Academy of Asthma, Allergy, and Immunology: www.aaaai.org 2. Food Allergy Research and Education (FARE): foodallergy.org 3. Mothers of Asthmatics: http://www.asthmacommunitynetwork.org 4. American College of Allergy, Asthma, and Immunology: www.acaai.org

## 2016-07-18 DIAGNOSIS — Z7952 Long term (current) use of systemic steroids: Secondary | ICD-10-CM | POA: Diagnosis not present

## 2016-07-25 ENCOUNTER — Telehealth: Payer: Self-pay | Admitting: Pharmacist

## 2016-07-25 ENCOUNTER — Other Ambulatory Visit: Payer: Self-pay | Admitting: Internal Medicine

## 2016-07-25 DIAGNOSIS — I10 Essential (primary) hypertension: Secondary | ICD-10-CM

## 2016-07-25 NOTE — Telephone Encounter (Signed)
Pt called to report she restarted her lasix on Friday as she was having some swelling in both her legs and abdomen. She has been taking valsartan and chlorthalidone as well. She states that the lasix has helped with her fluid retention. She wishes to remain on lasix for now because she is very uncomfortable when she retains fluid. She still refuses compression stockings and states she will be traveling for 3 weeks starting next Sunday. Will have her return for BMET on Friday to ensure kidney function and electrolytes remain stable with resumption of lasix. She states she understands and appreciates help. She also asks why she does not "run to the bathroom" like some of her friends. Advised that not everyone experiences urgency with diuretic and that the important thing is that the fluid retention improves. She states understanding and appreciation.

## 2016-07-29 ENCOUNTER — Other Ambulatory Visit: Payer: Medicare Other

## 2016-08-08 ENCOUNTER — Telehealth: Payer: Self-pay | Admitting: *Deleted

## 2016-08-08 NOTE — Telephone Encounter (Signed)
Noted. Thanks for keeping me information, Tammy!   Salvatore Marvel, MD [facility]

## 2016-08-08 NOTE — Telephone Encounter (Signed)
Submitted patient to [facility] to fill Fasenra rx after getting approval.  They advised me and patient high copay for drug and referred her to their copay assistance dept.  Patient never called back and they have reached out to her three times with no response.  I did contact patient and discussed with her she will probably qualify for [facility] free drug program. I will mail her application she is out of state at this time and wont be back home for 2 weeks.  I will keep Dr Ernst Bowler up to date with process

## 2016-08-10 DIAGNOSIS — J45901 Unspecified asthma with (acute) exacerbation: Secondary | ICD-10-CM | POA: Diagnosis not present

## 2016-08-19 ENCOUNTER — Other Ambulatory Visit: Payer: Self-pay | Admitting: Internal Medicine

## 2016-08-19 ENCOUNTER — Other Ambulatory Visit: Payer: Self-pay | Admitting: *Deleted

## 2016-08-19 MED ORDER — OMEPRAZOLE 40 MG PO CPDR: DELAYED_RELEASE_CAPSULE | ORAL | 1 refills | Status: DC

## 2016-08-19 MED ORDER — RANITIDINE HCL 300 MG PO TABS: ORAL_TABLET | ORAL | 1 refills | Status: DC

## 2016-08-24 ENCOUNTER — Ambulatory Visit (INDEPENDENT_AMBULATORY_CARE_PROVIDER_SITE_OTHER): Payer: Medicare Other | Admitting: Allergy

## 2016-08-24 ENCOUNTER — Encounter: Payer: Self-pay | Admitting: Allergy

## 2016-08-24 VITALS — BP 110/70 | HR 58 | Temp 97.9°F | Resp 18 | Ht 66.0 in

## 2016-08-24 DIAGNOSIS — J3081 Allergic rhinitis due to animal (cat) (dog) hair and dander: Secondary | ICD-10-CM

## 2016-08-24 DIAGNOSIS — K219 Gastro-esophageal reflux disease without esophagitis: Secondary | ICD-10-CM | POA: Diagnosis not present

## 2016-08-24 DIAGNOSIS — J309 Allergic rhinitis, unspecified: Secondary | ICD-10-CM | POA: Diagnosis not present

## 2016-08-24 DIAGNOSIS — J4540 Moderate persistent asthma, uncomplicated: Secondary | ICD-10-CM

## 2016-08-24 MED ORDER — ALBUTEROL SULFATE HFA 108 (90 BASE) MCG/ACT IN AERS
2.0000 | INHALATION_SPRAY | RESPIRATORY_TRACT | 1 refills | Status: DC | PRN
Start: 2016-08-24 — End: 2016-10-17

## 2016-08-24 MED ORDER — TIOTROPIUM BROMIDE MONOHYDRATE 1.25 MCG/ACT IN AERS: 2.0000 | INHALATION_SPRAY | Freq: Every day | RESPIRATORY_TRACT | 5 refills | Status: DC

## 2016-08-24 NOTE — Patient Instructions (Addendum)
Moderate persistent asthma - currently not well controlled  - based on your symptoms and eosinophil count you qualify for Fasenra (biologic agent) for asthma management.  We are looking into the drug assistant program for Fasenra coverage.   - Daily controller medication(s): Symbicort 160/4.5 two puffs twice daily - Start Spiriva 1.24mcg take 2 puffs daily (take mid-day) - take prednisone 20mg  twice a day for next 7 days - Rescue medications: ProAir 4 puffs every 4-6 hours as needed - may continue Mucinex DM twice a day as needed for help with cough control - Asthma control goals:  * Full participation in all desired activities (may need albuterol before activity) * Albuterol use two time or less a week on average (not counting use with activity) * Cough interfering with sleep two time or less a month * Oral steroids no more than once a year * No hospitalizations  LPRD (laryngopharyngeal reflux disease) - Continue with omeprazole 40mg  daily  Chronic allergic rhinitis - Continue with allergy shots at the same schedule. - Continue with Flonase + azelastine daily. - Continue with Xyzal 5mg  daily.  Return in about 2-3 months or sooner if needed

## 2016-08-24 NOTE — Progress Notes (Signed)
Follow-up Note  RE: Vanessa Harmon MRN: [id number] DOB: 13-Apr-1951 Date of Office Visit: 08/24/2016   History of present illness: Vanessa Harmon is a 66 y.o. female presenting today for follow-up of asthma.  She was last seen in the office by Dr. Ernst Bowler on 07/07/16.  She reports she has been struggling with her asthma symptoms over the last several weeks.  She states after her last visit she was doing well up until several days before she left for her vacation in [place] when she started having coughing which she says her cough got progressively worse. She did have to go to the urgent care in [place] on April 25 for her cough and she did receive a steroid injection there.  She had some improvement in her symptoms however her cough has worsened again.  She is using her Symbicort twice a day as well as Mucinex DM twice a day. She also uses her albuterol at least twice a day for the past couple of weeks if not more.  She does feel like the Mucinex DM helps with her cough.  At her last visit it was discussed her starting on a biologic agent for better asthma control.  She did have blood work showing greater than 150 eosinophils.  She states she would like to breathe normally again and have more energy.  She states that her allergy symptoms are doing okay with use of Flonase plus azelastine twice a day as well as Xyzal daily. She is on allergen immunotherapy however her last shot was about 2 months ago which she states she has been unable to get her shots due to her breathing.    She continues to make to take omeprazole daily for her reflux.    Review of systems: Review of Systems  Constitutional: Positive for malaise/fatigue. Negative for chills and fever.  HENT: Negative for congestion, ear discharge, ear pain, nosebleeds, sinus pain, sore throat and tinnitus.   Eyes: Negative for discharge and redness.  Respiratory: Positive for cough, shortness of breath and wheezing. Negative for sputum  production.   Cardiovascular: Negative for chest pain.  Gastrointestinal: Negative for abdominal pain, heartburn, nausea and vomiting.  Musculoskeletal: Negative for joint pain and myalgias.  Skin: Negative for itching and rash.  Neurological: Negative for headaches.    All other systems negative unless noted above in HPI  Past medical/social/surgical/family history have been reviewed and are unchanged unless specifically indicated below.  No changes  Medication List: Allergies as of 08/24/2016      Reactions   Breo Ellipta [fluticasone Furoate-vilanterol]    unknown   Dulera [mometasone Furo-formoterol Fum]    Speech impairment   Singulair [montelukast Sodium] Other (See Comments)   Suicide thoughts   Erythromycin Nausea Only   Tape Rash   PAPER TAPE: Causes severe rash   Tetracyclines & Related Nausea Only      Medication List       Accurate as of 08/24/16  4:15 PM. Always use your most recent med list.          aspirin 81 MG tablet Take 81 mg by mouth daily.   atorvastatin 80 MG tablet Commonly known as:  LIPITOR TAKE 1/2 BY MOUTH ONCE DAILY   azelastine 0.1 % nasal spray Commonly known as:  ASTELIN INSTILL 1 SPRAY INTO EACH NOSTRIL TWICE DAILY   budesonide-formoterol 160-4.5 MCG/ACT inhaler Commonly known as:  SYMBICORT Inhale 2 puffs into the lungs 2 (two) times daily.   CALCIUM +  D PO Take by mouth 2 (two) times daily.   chlorthalidone 25 MG tablet Commonly known as:  HYGROTON Take 1 tablet (25 mg total) by mouth daily.   dextromethorphan-guaiFENesin 30-600 MG 12hr tablet Commonly known as:  MUCINEX DM Take 1 tablet by mouth daily as needed for cough.   escitalopram 20 MG tablet Commonly known as:  LEXAPRO TAKE 1 TABLET BY MOUTH DAILY   FIBER CHOICE PO Take 1 capsule by mouth daily.   fish oil-omega-3 fatty acids 1000 MG capsule Take 1 g by mouth 2 (two) times daily.   fluticasone 50 MCG/ACT nasal spray Commonly known as:  FLONASE INSTILL  TWO   SPRAYS INTO EACH NOSTRIL EVERY DAY   folic acid 1 MG tablet Commonly known as:  FOLVITE Take 1 mg by mouth daily.   furosemide 20 MG tablet Commonly known as:  LASIX TAKE 1 TABLET (20 MG TOTAL) BY MOUTH DAILY.   indomethacin 75 MG CR capsule Commonly known as:  INDOCIN SR TAKE 1 CAPSULE BY MOUTH DAILY   LORazepam 1 MG tablet Commonly known as:  ATIVAN TAKE ONE TABLET   BY MOUTH   DAILY IN THE MORNING AND TWO   TABLET BY MOUTH   AT BEDTIME   methotrexate 25 MG/ML injection Inject 25 mg into the skin once a week. 15 mg=0.8 ml  Every Wednesday   multivitamin capsule Take 1 capsule by mouth daily.   omeprazole 40 MG capsule Commonly known as:  PRILOSEC Take one capsule before breakfast daily   PROAIR HFA 108 (90 Base) MCG/ACT inhaler Generic drug:  albuterol Inhale 2 puffs into the lungs every 4 (four) hours as needed for wheezing or shortness of breath.   ranitidine 300 MG tablet Commonly known as:  ZANTAC Take one tablet at bedtime daily   STOOL SOFTENER PO Take 1 capsule by mouth as needed.   valsartan 320 MG tablet Commonly known as:  DIOVAN Take 1 tablet (320 mg total) by mouth daily.   vitamin C 1000 MG tablet Take 1,000 mg by mouth daily.       Known medication allergies: Allergies  Allergen Reactions  . Breo Ellipta [Fluticasone Furoate-Vilanterol]     unknown  . Dulera [Mometasone Furo-Formoterol Fum]     Speech impairment  . Singulair [Montelukast Sodium] Other (See Comments)    Suicide thoughts  . Erythromycin Nausea Only  . Tape Rash    PAPER TAPE: Causes severe rash  . Tetracyclines & Related Nausea Only     Physical examination: Blood pressure 110/70, pulse (!) 58, temperature 97.9 F (36.6 C), temperature source Oral, resp. rate 18, height 5\' 6"  (1.676 m), SpO2 98 %.  General: Alert, interactive, in no acute distress. HEENT: TMs pearly gray, turbinates mildly edematous without discharge, post-pharynx non  erythematous. Neck: Supple without lymphadenopathy. Lungs: Clear to auscultation without wheezing, rhonchi or rales. {no increased work of breathing. CV: Normal S1, S2 without murmurs. Abdomen: Nondistended, nontender.  Skin: Cluster of brown scab-like papules under her left eye. Extremities:  No clubbing, cyanosis or edema. Neuro:   Grossly intact.  Diagnositics/Labs: Labs:  Component     Latest Ref Rng & Units 07/07/2016  WBC     3.8 - 10.8 K/uL 8.4  RBC     3.80 - 5.10 MIL/uL 4.09  Hemoglobin     11.7 - 15.5 g/dL 13.4  HCT     35.0 - 45.0 % 39.9  MCV     80.0 - 100.0 fL 97.6  MCH     27.0 - 33.0 pg 32.8  MCHC     32.0 - 36.0 g/dL 33.6  RDW     11.0 - 15.0 % 14.0  Platelets     140 - 400 K/uL 255  MPV     7.5 - 12.5 fL 12.1  NEUT#     1,500 - 7,800 cells/uL 6,636  Lymphocyte #     850 - 3,900 cells/uL 1,176  Monocyte #     200 - 950 cells/uL 420  Eosinophils Absolute     15 - 500 cells/uL 168  Basophils Absolute     0 - 200 cells/uL 0  Neutrophils     % 79  Lymphocytes     % 14  Monocytes Relative     % 5  Eosinophil     % 2  Basophil     % 0   Spirometry: FEV1: 2.2L  90%, FVC: 2.72L  88%, ratio consistent with non-obstructive pattern  Assessment and plan:   Moderate persistent asthma - currently not well controlled  - based on your symptoms and eosinophil count you qualify for Fasenra (biologic agent) for asthma management.  We are looking into the drug assistant program for Fasenra coverage.   - Daily controller medication(s): Symbicort 160/4.5 two puffs twice daily - Start Spiriva 1.40mcg take 2 puffs daily (take mid-day) - take prednisone 20mg  twice a day for next 7 days - Rescue medications: ProAir 4 puffs every 4-6 hours as needed - may continue Mucinex DM twice a day as needed for help with cough control - Asthma control goals:  * Full participation in all desired activities (may need albuterol before activity) * Albuterol use two time or  less a week on average (not counting use with activity) * Cough interfering with sleep two time or less a month * Oral steroids no more than once a year * No hospitalizations  LPRD (laryngopharyngeal reflux disease) - Continue with omeprazole 40mg  daily  Chronic allergic rhinitis - Continue with allergy shots at the same schedule. - Continue with Flonase + azelastine daily. - Continue with Xyzal 5mg  daily.  Return in about 2-3 months or sooner if needed  I appreciate the opportunity to take part in Vanessa Harmon's care. Please do not hesitate to contact me with questions.  Sincerely,   Prudy Feeler, MD Allergy/Immunology [facility]

## 2016-08-25 ENCOUNTER — Ambulatory Visit (INDEPENDENT_AMBULATORY_CARE_PROVIDER_SITE_OTHER): Payer: Medicare Other | Admitting: *Deleted

## 2016-08-25 DIAGNOSIS — J4550 Severe persistent asthma, uncomplicated: Secondary | ICD-10-CM

## 2016-08-25 MED ORDER — BENRALIZUMAB 30 MG/ML ~~LOC~~ SOSY
30.0000 mg | PREFILLED_SYRINGE | SUBCUTANEOUS | Status: AC
  Administered 2016-08-25 – 2016-10-17 (×3): 30 mg via SUBCUTANEOUS

## 2016-08-25 NOTE — Progress Notes (Signed)
Immunotherapy   Patient Details  Name: SHAMEEKA SILLIMAN MRN: [id number] Date of Birth: [birth year]  08/25/2016  Rozanna Boer Melucci started injections for  Fasenra 30mg /ml. Following schedule: every 28 days for 3 days then once every 8 weeks. Epi-Pen:Epi-Pen Available  Consent signed and patient instructions given. No problems after 60 minutes in office.   Constance Holster 08/25/2016, 6:32 PM

## 2016-08-26 DIAGNOSIS — M5136 Other intervertebral disc degeneration, lumbar region: Secondary | ICD-10-CM | POA: Diagnosis not present

## 2016-08-26 DIAGNOSIS — Z79899 Other long term (current) drug therapy: Secondary | ICD-10-CM | POA: Diagnosis not present

## 2016-08-26 DIAGNOSIS — M1990 Unspecified osteoarthritis, unspecified site: Secondary | ICD-10-CM | POA: Diagnosis not present

## 2016-08-26 DIAGNOSIS — M150 Primary generalized (osteo)arthritis: Secondary | ICD-10-CM | POA: Diagnosis not present

## 2016-08-26 DIAGNOSIS — R7989 Other specified abnormal findings of blood chemistry: Secondary | ICD-10-CM | POA: Diagnosis not present

## 2016-08-30 ENCOUNTER — Ambulatory Visit (INDEPENDENT_AMBULATORY_CARE_PROVIDER_SITE_OTHER): Payer: Medicare Other

## 2016-08-30 ENCOUNTER — Other Ambulatory Visit (INDEPENDENT_AMBULATORY_CARE_PROVIDER_SITE_OTHER): Payer: Medicare Other

## 2016-08-30 DIAGNOSIS — J3081 Allergic rhinitis due to animal (cat) (dog) hair and dander: Secondary | ICD-10-CM | POA: Diagnosis not present

## 2016-08-30 DIAGNOSIS — I10 Essential (primary) hypertension: Secondary | ICD-10-CM

## 2016-08-31 ENCOUNTER — Telehealth: Payer: Self-pay | Admitting: Pharmacist

## 2016-08-31 LAB — BASIC METABOLIC PANEL
BUN/Creatinine Ratio: 28 (ref 12–28)
BUN: 28 mg/dL — ABNORMAL HIGH (ref 8–27)
CO2: 25 mmol/L (ref 18–29)
Calcium: 9.7 mg/dL (ref 8.7–10.3)
Chloride: 91 mmol/L — ABNORMAL LOW (ref 96–106)
Creatinine, Ser: 1.01 mg/dL — ABNORMAL HIGH (ref 0.57–1.00)
GFR calc Af Amer: 67 mL/min/{1.73_m2} (ref >59)
GFR calc non Af Amer: 58 mL/min/{1.73_m2} — ABNORMAL LOW (ref >59)
Glucose: 218 mg/dL — ABNORMAL HIGH (ref 65–99)
Potassium: 3.7 mmol/L (ref 3.5–5.2)
Sodium: 134 mmol/L (ref 134–144)

## 2016-08-31 NOTE — Telephone Encounter (Signed)
Pt called to report that she has been dizziness and feeling poorly for the last few days. She states her pressures have been running in low 90s - mid100s/60s. She believes that she feels poorly due to her pressures being low. Advised to cut chlorthalidone to 1/2 tablet daily and change lasix to as needed. She monitors weight daily and verbalized appropriate understanding of use. She will continue to monitor and call in a few days if no improvement or if pressure trend up. Pt states understand and appreciation.

## 2016-08-31 NOTE — Telephone Encounter (Signed)
Please call pt. Not clear to me if Rheumatologist wanted her to stay on this. Please advise.

## 2016-09-01 ENCOUNTER — Telehealth: Payer: Self-pay | Admitting: Cardiology

## 2016-09-01 NOTE — Telephone Encounter (Signed)
Patient states that she is returning a call in regards to a change in medication.Thanks.

## 2016-09-01 NOTE — Telephone Encounter (Signed)
She stated that the Rheumatologist has not objected to it, and she comes in next week but stated this is the only medication that works for her and does not want to change it. She is not out of medication so does not need a refill sent in until her visit.

## 2016-09-01 NOTE — Addendum Note (Signed)
Addended by: Harland German A on: 09/01/2016 06:22 PM   Modules accepted: Level of Service, SmartSet

## 2016-09-01 NOTE — Telephone Encounter (Signed)
This encounter was created in error - please disregard.

## 2016-09-02 DIAGNOSIS — H04123 Dry eye syndrome of bilateral lacrimal glands: Secondary | ICD-10-CM | POA: Diagnosis not present

## 2016-09-02 DIAGNOSIS — H16142 Punctate keratitis, left eye: Secondary | ICD-10-CM | POA: Diagnosis not present

## 2016-09-05 ENCOUNTER — Other Ambulatory Visit: Payer: Medicare Other | Admitting: Internal Medicine

## 2016-09-05 ENCOUNTER — Other Ambulatory Visit: Payer: Self-pay | Admitting: Internal Medicine

## 2016-09-05 DIAGNOSIS — E785 Hyperlipidemia, unspecified: Secondary | ICD-10-CM | POA: Diagnosis not present

## 2016-09-05 DIAGNOSIS — I10 Essential (primary) hypertension: Secondary | ICD-10-CM

## 2016-09-05 DIAGNOSIS — R7302 Impaired glucose tolerance (oral): Secondary | ICD-10-CM

## 2016-09-05 LAB — CBC
HCT: 38.2 % (ref 35.0–45.0)
Hemoglobin: 12.6 g/dL (ref 11.7–15.5)
MCH: 33 pg (ref 27.0–33.0)
MCHC: 33 g/dL (ref 32.0–36.0)
MCV: 100 fL (ref 80.0–100.0)
MPV: 11.4 fL (ref 7.5–12.5)
Platelets: 219 10*3/uL (ref 140–400)
RBC: 3.82 MIL/uL (ref 3.80–5.10)
RDW: 14.7 % (ref 11.0–15.0)
WBC: 6.5 10*3/uL (ref 3.8–10.8)

## 2016-09-06 ENCOUNTER — Ambulatory Visit (INDEPENDENT_AMBULATORY_CARE_PROVIDER_SITE_OTHER): Payer: Medicare Other | Admitting: Internal Medicine

## 2016-09-06 ENCOUNTER — Ambulatory Visit (INDEPENDENT_AMBULATORY_CARE_PROVIDER_SITE_OTHER): Payer: Medicare Other | Admitting: *Deleted

## 2016-09-06 ENCOUNTER — Encounter: Payer: Self-pay | Admitting: Internal Medicine

## 2016-09-06 VITALS — BP 128/70 | HR 54 | Temp 99.4°F | Ht 66.0 in | Wt 185.0 lb

## 2016-09-06 DIAGNOSIS — Z8659 Personal history of other mental and behavioral disorders: Secondary | ICD-10-CM | POA: Diagnosis not present

## 2016-09-06 DIAGNOSIS — J309 Allergic rhinitis, unspecified: Secondary | ICD-10-CM | POA: Diagnosis not present

## 2016-09-06 DIAGNOSIS — M1990 Unspecified osteoarthritis, unspecified site: Secondary | ICD-10-CM | POA: Diagnosis not present

## 2016-09-06 DIAGNOSIS — K219 Gastro-esophageal reflux disease without esophagitis: Secondary | ICD-10-CM | POA: Diagnosis not present

## 2016-09-06 DIAGNOSIS — M797 Fibromyalgia: Secondary | ICD-10-CM | POA: Diagnosis not present

## 2016-09-06 DIAGNOSIS — E782 Mixed hyperlipidemia: Secondary | ICD-10-CM | POA: Diagnosis not present

## 2016-09-06 DIAGNOSIS — I10 Essential (primary) hypertension: Secondary | ICD-10-CM | POA: Diagnosis not present

## 2016-09-06 LAB — LIPID PANEL
Cholesterol: 168 mg/dL (ref <200)
HDL: 52 mg/dL (ref >50)
LDL Cholesterol: 87 mg/dL (ref <100)
Total CHOL/HDL Ratio: 3.2 Ratio (ref <5.0)
Triglycerides: 146 mg/dL (ref <150)
VLDL: 29 mg/dL (ref <30)

## 2016-09-06 LAB — HEMOGLOBIN A1C
Hgb A1c MFr Bld: 5.6 % (ref <5.7)
Mean Plasma Glucose: 114 mg/dL

## 2016-09-06 NOTE — Progress Notes (Signed)
   Subjective:    Patient ID: Vanessa Harmon, female    DOB: 1950/06/10, 66 y.o.   MRN: [id number]  HPI Patient is being seen by Dr. Nelva Bush regarding asthma management. Has been diagnosed with moderate persistent asthma. Had elevated eosinophil count and has been placed on Fasenra which apparently is very expensive and has a very high co-pay for her. She is not happy about that. Was unable to qualify for program assistance because she is on Medicare.  Traveled recently to the [facility] and developed  infraorbital left eye rash that she thought might be shingles. She went to an urgent care and apparently was given some steroids. However, subsequently developed visual changes in her right eye. She went to see Dr. [name] was diagnosed with herpes in her right eye.  Enjoys her trip to the [facility]. Will be going up [place] soon with her camper to the [facility].  With regard to hyperlipidemia, lipid panel is within normal limits.  She has not been entirely pleased with blood pressure management. Still complaining of edema in her right lower extremity which is nonpitting. Explained to her that perhaps torsemide would be more effective than Lasix. She claims not to have significant urination with the Lasix. She will discuss this with next appointment it blood pressure clinic at cardiology office.      Review of Systems she is dysthymic regarding her medical issues     Objective:   Physical Exam Skin warm and dry. Nodes none. Neck is supple. Chest clear. Cardiac exam regular rate and rhythm normal S1 and S2. Trace nonpitting edema right lower extremity as compared to left which has no significant edema       Assessment & Plan:  Dependent edema  Hypertension-currently on Diovan, Lasix 20 mg daily, hypertonic 25 mg daily. Being followed at blood pressure clinic at cardiology office  Hyperlipidemia-lipid panel stable on Lipitor 80 mg daily  Asthma  Arthritis-treated by  rheumatologist  Anxiety-treated with lorazepam  Depression treated with Lexapro  Plan: Return in 6 months for physical exam or as needed.

## 2016-09-07 NOTE — Patient Instructions (Signed)
Continue same medications and return in 6 months 

## 2016-09-13 ENCOUNTER — Ambulatory Visit (INDEPENDENT_AMBULATORY_CARE_PROVIDER_SITE_OTHER): Payer: Medicare Other

## 2016-09-13 ENCOUNTER — Telehealth: Payer: Self-pay | Admitting: *Deleted

## 2016-09-13 ENCOUNTER — Ambulatory Visit (INDEPENDENT_AMBULATORY_CARE_PROVIDER_SITE_OTHER): Payer: Medicare Other | Admitting: Pharmacist

## 2016-09-13 VITALS — BP 126/70 | HR 57

## 2016-09-13 DIAGNOSIS — J309 Allergic rhinitis, unspecified: Secondary | ICD-10-CM

## 2016-09-13 DIAGNOSIS — I10 Essential (primary) hypertension: Secondary | ICD-10-CM

## 2016-09-13 NOTE — Telephone Encounter (Signed)
I had talked to patient back on 08/08/16 about high copay at [facility] for [place] and about applying for [employer] for free drug. I mailed application for patient to fill out and return to me. Received call today from patient and she advised she did not qualify for free drug or assistance programs and still wants to move forward with getting drug and pay high copay.  I advised her that I would notify [facility] to reactivate her prescription and contact patient to get copay and setup shipment.

## 2016-09-13 NOTE — Patient Instructions (Addendum)
It was great to see you today.  Your blood pressure looks great today!  Please start taking your lasix 20mg  every Monday, Wednesday, and Friday.  Continue to take the rest of your medications the same.  Call us if you have any questions or concerns [phone].

## 2016-09-13 NOTE — Progress Notes (Signed)
Patient ID: Vanessa Harmon                 DOB: 18-Nov-1950                      MRN: [id number]     HPI: Vanessa Harmon is a 66 y.o. female patient of Dr. Radford Pax who presents to HTN clinic for follow up.PMH is significant for HTN, HLD, PSVT, asthma, and depression. At last visit, pt BP was at goal and medications were continued. Pt then called 07/25/16 to say that she started taking lasix for fluid in her legs and abdomen. ~1 month later, pt called again stating that she was dizzy and had BP readings of 90-100s/60s. At that time, chlorthalidone was decreased to 12.5mg  and pt told to only take lasix prn. Pt presents today for follow up.  Pt major complaint is right leg swelling. Presented with 3 pages of excel documents with BP, weight, and if she took prn lasix. Home BP readings are all 120-130s/70-80s. Weight 173-181 (states she feels 176 is her "dry weight"). Today 179, 1+ pitting edema. Has been taking lasix 1-2 times a week. Feels like fluid catches up with her. Denied any dizziness, fatigue, lightheadedness, or headaches. Will not wear compression stockings - very adamant about this. Verbalized this before the visit even started. Has increased the amount that she walks and has been watching diet closely.  Cardiac Hx: HTN, PSVT, HLD  Current HTN meds: valsartan 320mg  daily, chlorthalidone 12.5 mg daily, furosemide 20mg  prn  Previously tried: doxazosin - lightheadedness, amlodipine - LE edema, Cardizem - bradycardia, atenolol - bradycardia, olmesartan - hoarse voice  BP goal: <130/3mmHg  Family History: The patient's family history includes Cancer in her mother; Stroke in her mother.  Social History: Smoked remotely years ago. Drinks a glass of wine nightly, denies illicit drug use.  Diet: Limits caffeine intake. Drinks a 1-2 cups of coffee in the morning, and generally no other caffeine throughout the day. She generally cooks at home more than eating out.   Exercise:  Arthritis, asthma, and fibromyalgia limits activity. Does walk her dog occasionally.  Home BP readings: 3 pages from excel, documented very clearly. 120-130s/70-80s - overall well controlled at home at all times of the day.     Wt Readings from Last 3 Encounters:  05/18/16 184 lb (83.5 kg)  05/11/16 181 lb (82.1 kg)  05/10/16 182 lb (82.6 kg)      BP Readings from Last 3 Encounters:  06/28/16 128/80  06/15/16 (!) 146/88  05/18/16 (!) 156/88      Pulse Readings from Last 3 Encounters:  06/28/16 (!) 52  06/15/16 (!) 51  05/18/16 (!) 53    Renal function: CrCl cannot be calculated (Patient's most recent lab result is older than the maximum 21 days allowed.).  Past Medical History:  Diagnosis Date  . Adenomatous polyp   . Anxiety    takes Ativan daily as needed  . Arthritis    inflammatory arthritis (Dr. Dagoberto Ligas)  . Chronic back pain    scoliosis   . Constipation    takes miralax every other day  . Depression    takes Lexapro daily  . Essential hypertension, benign    takes Hyzaar and Metoprolol daily  . History of blood transfusion   . History of bronchitis 5 rys ago  . History of colon polyps   . History of shingles   . Hyperlipidemia  takes Atorvastatin daily  . Insomnia    takes Melatonin nightly  . Joint pain   . Pneumonia as a child  . PSVT (paroxysmal supraventricular tachycardia) ([facility])   . Rhinitis, allergic    uses Flonase daily  . Urinary urgency           Current Outpatient Prescriptions on File Prior to Visit  Medication Sig Dispense Refill  . albuterol (PROAIR HFA) 108 (90 Base) MCG/ACT inhaler Inhale 2 puffs into the lungs every 4 (four) hours as needed for wheezing or shortness of breath.    . Ascorbic Acid (VITAMIN C) 1000 MG tablet Take 1,000 mg by mouth daily.      Marland Kitchen aspirin 81 MG tablet Take 81 mg by mouth daily.    Marland Kitchen atorvastatin (LIPITOR) 80 MG tablet TAKE 1/2 BY MOUTH ONCE DAILY 90  tablet 1  . azelastine (ASTELIN) 0.1 % nasal spray INSTILL 1 SPRAY INTO EACH NOSTRIL TWICE DAILY 30 mL prn  . budesonide-formoterol (SYMBICORT) 160-4.5 MCG/ACT inhaler Inhale 2 puffs into the lungs 2 (two) times daily. 1 Inhaler 5  . Calcium Carbonate-Vitamin D (CALCIUM + D PO) Take by mouth 2 (two) times daily.      . chlorthalidone (HYGROTON) 25 MG tablet Take 1 tablet (25 mg total) by mouth daily. 30 tablet 11  . dextromethorphan-guaiFENesin (MUCINEX DM) 30-600 MG 12hr tablet Take 1 tablet by mouth daily as needed for cough.    Mariane Baumgarten Calcium (STOOL SOFTENER PO) Take 1 capsule by mouth as needed.    Marland Kitchen escitalopram (LEXAPRO) 20 MG tablet TAKE 1 TABLET BY MOUTH DAILY 30 tablet 11  . fish oil-omega-3 fatty acids 1000 MG capsule Take 1 g by mouth 2 (two) times daily.      . fluticasone (FLONASE) 50 MCG/ACT nasal spray INSTILL TWO   SPRAYS INTO EACH NOSTRIL EVERY DAY 16 g prn  . folic acid (FOLVITE) 1 MG tablet Take 1 mg by mouth daily.    . furosemide (LASIX) 20 MG tablet TAKE 1 TABLET (20 MG TOTAL) BY MOUTH DAILY. 30 tablet 5  . indomethacin (INDOCIN SR) 75 MG CR capsule TAKE 1 CAPSULE BY MOUTH DAILY 30 capsule 5  . Inulin (FIBER CHOICE PO) Take 1 capsule by mouth daily.    Marland Kitchen LORazepam (ATIVAN) 1 MG tablet TAKE ONE TABLET   BY MOUTH   DAILY IN THE MORNING AND TWO   TABLET BY MOUTH   AT BEDTIME 90 tablet 5  . methotrexate 25 MG/ML injection Inject 25 mg into the skin once a week. 15 mg=0.8 ml  Every Wednesday    . Multiple Vitamin (MULTIVITAMIN) capsule Take 1 capsule by mouth daily.    Marland Kitchen omeprazole (PRILOSEC) 40 MG capsule Take 1 capsule (40 mg total) by mouth daily. 30 capsule 5  . Probiotic Product (PROBIOTIC DAILY PO) Take 1 capsule by mouth daily.    . ranitidine (ZANTAC) 150 MG tablet Take 150 mg by mouth 2 (two) times daily.    . valsartan (DIOVAN) 320 MG tablet Take 1 tablet (320 mg total) by mouth daily. 30 tablet 0            Current Facility-Administered  Medications on File Prior to Visit  Medication Dose Route Frequency Provider Last Rate Last Dose  . predniSONE (DELTASONE) tablet 10 mg  10 mg Oral Q breakfast Adelina Mings, MD             Allergies  Allergen Reactions  . Breo Ellipta [Fluticasone Furoate-Vilanterol]  unknown  . Dulera [Mometasone Furo-Formoterol Fum]     Speech impairment  . Singulair [Montelukast Sodium] Other (See Comments)    Suicide thoughts  . Erythromycin Nausea Only  . Tape Rash    PAPER TAPE: Causes severe rash  . Tetracyclines & Related Nausea Only    Blood pressure 126/70, pulse (!) 57, SpO2 96%.   Assessment/Plan:  1. Hypertension - Pt currently at BP goal < 130/80 mmHg on valsartan 320mg  daily, chlorthalidone 12.5 mg daily, and furosemide 20mg  prn. Will increase lasix dose to 20mg  every MWF to help with right leg swelling. Instructed pt to call if there are any issues/concerns or if edema does not improve/worsen. Latest BMET wnl. Could schedule lasix daily again if edema does not improve since patient now only on 12.5 mg chlorthalidone. Follow up as needed.  Melburn Popper, PharmD, [facility] PGY2 Pharmacy Resident 09/13/16 8:48 AM  Jinny Blossom E. Supple, PharmD, CPP, [place] [facility] [address] [place], [place] [address] Phone: [phone]; Fax: [phone]

## 2016-09-14 NOTE — Telephone Encounter (Signed)
Noted. Thank you, Tammy!   Salvatore Marvel, MD [facility]

## 2016-09-20 ENCOUNTER — Ambulatory Visit (INDEPENDENT_AMBULATORY_CARE_PROVIDER_SITE_OTHER): Payer: Medicare Other | Admitting: *Deleted

## 2016-09-20 DIAGNOSIS — J309 Allergic rhinitis, unspecified: Secondary | ICD-10-CM

## 2016-09-22 ENCOUNTER — Ambulatory Visit: Payer: Medicare Other

## 2016-09-23 ENCOUNTER — Ambulatory Visit (INDEPENDENT_AMBULATORY_CARE_PROVIDER_SITE_OTHER): Payer: Medicare Other

## 2016-09-23 DIAGNOSIS — J4550 Severe persistent asthma, uncomplicated: Secondary | ICD-10-CM | POA: Diagnosis not present

## 2016-09-26 ENCOUNTER — Other Ambulatory Visit: Payer: Self-pay | Admitting: Internal Medicine

## 2016-09-28 ENCOUNTER — Ambulatory Visit: Payer: Self-pay

## 2016-09-30 DIAGNOSIS — H04123 Dry eye syndrome of bilateral lacrimal glands: Secondary | ICD-10-CM | POA: Diagnosis not present

## 2016-09-30 DIAGNOSIS — H02403 Unspecified ptosis of bilateral eyelids: Secondary | ICD-10-CM | POA: Diagnosis not present

## 2016-10-04 DIAGNOSIS — H53489 Generalized contraction of visual field, unspecified eye: Secondary | ICD-10-CM | POA: Diagnosis not present

## 2016-10-04 DIAGNOSIS — H02834 Dermatochalasis of left upper eyelid: Secondary | ICD-10-CM | POA: Diagnosis not present

## 2016-10-04 DIAGNOSIS — H02831 Dermatochalasis of right upper eyelid: Secondary | ICD-10-CM | POA: Diagnosis not present

## 2016-10-04 DIAGNOSIS — H02413 Mechanical ptosis of bilateral eyelids: Secondary | ICD-10-CM | POA: Diagnosis not present

## 2016-10-06 ENCOUNTER — Ambulatory Visit (INDEPENDENT_AMBULATORY_CARE_PROVIDER_SITE_OTHER): Payer: Medicare Other | Admitting: *Deleted

## 2016-10-06 DIAGNOSIS — J309 Allergic rhinitis, unspecified: Secondary | ICD-10-CM

## 2016-10-11 ENCOUNTER — Other Ambulatory Visit: Payer: Self-pay | Admitting: Internal Medicine

## 2016-10-11 NOTE — Telephone Encounter (Signed)
Refill x 6 months 

## 2016-10-12 ENCOUNTER — Ambulatory Visit: Payer: Medicare Other | Admitting: Allergy & Immunology

## 2016-10-13 ENCOUNTER — Other Ambulatory Visit: Payer: Self-pay | Admitting: Internal Medicine

## 2016-10-13 ENCOUNTER — Ambulatory Visit (INDEPENDENT_AMBULATORY_CARE_PROVIDER_SITE_OTHER): Payer: Medicare Other | Admitting: *Deleted

## 2016-10-13 DIAGNOSIS — J309 Allergic rhinitis, unspecified: Secondary | ICD-10-CM | POA: Diagnosis not present

## 2016-10-17 ENCOUNTER — Ambulatory Visit (INDEPENDENT_AMBULATORY_CARE_PROVIDER_SITE_OTHER): Payer: Medicare Other

## 2016-10-17 ENCOUNTER — Other Ambulatory Visit: Payer: Self-pay | Admitting: Allergy

## 2016-10-17 ENCOUNTER — Other Ambulatory Visit: Payer: Self-pay | Admitting: *Deleted

## 2016-10-17 DIAGNOSIS — J4550 Severe persistent asthma, uncomplicated: Secondary | ICD-10-CM

## 2016-10-17 DIAGNOSIS — J4540 Moderate persistent asthma, uncomplicated: Secondary | ICD-10-CM

## 2016-10-17 MED ORDER — OMEPRAZOLE 40 MG PO CPDR: DELAYED_RELEASE_CAPSULE | ORAL | 4 refills | Status: DC

## 2016-10-17 MED ORDER — RANITIDINE HCL 300 MG PO TABS: ORAL_TABLET | ORAL | 4 refills | Status: DC

## 2016-10-18 ENCOUNTER — Ambulatory Visit: Payer: Medicare Other

## 2016-10-26 ENCOUNTER — Ambulatory Visit: Payer: Medicare Other

## 2016-11-10 ENCOUNTER — Ambulatory Visit (INDEPENDENT_AMBULATORY_CARE_PROVIDER_SITE_OTHER): Payer: Medicare Other | Admitting: *Deleted

## 2016-11-10 DIAGNOSIS — J309 Allergic rhinitis, unspecified: Secondary | ICD-10-CM

## 2016-11-14 ENCOUNTER — Other Ambulatory Visit: Payer: Self-pay | Admitting: Internal Medicine

## 2016-11-14 NOTE — Telephone Encounter (Signed)
Refill x 6 months 

## 2016-11-17 DIAGNOSIS — J3081 Allergic rhinitis due to animal (cat) (dog) hair and dander: Secondary | ICD-10-CM | POA: Diagnosis not present

## 2016-11-18 ENCOUNTER — Ambulatory Visit (INDEPENDENT_AMBULATORY_CARE_PROVIDER_SITE_OTHER): Payer: Medicare Other

## 2016-11-18 DIAGNOSIS — J309 Allergic rhinitis, unspecified: Secondary | ICD-10-CM

## 2016-11-21 ENCOUNTER — Telehealth: Payer: Self-pay | Admitting: Pharmacist

## 2016-11-21 DIAGNOSIS — J3089 Other allergic rhinitis: Secondary | ICD-10-CM | POA: Diagnosis not present

## 2016-11-21 MED ORDER — IRBESARTAN 300 MG PO TABS: 300.0000 mg | ORAL_TABLET | Freq: Every day | ORAL | 11 refills | Status: DC

## 2016-11-21 NOTE — Telephone Encounter (Signed)
Pt called clinic stating she was affected by valsartan recall. Will change valsartan 320mg  daily to irbesartan 300mg . Advised pt to continue to monitor her BP for the next few weeks to ensure readings remain stable.

## 2016-11-24 ENCOUNTER — Encounter: Payer: Self-pay | Admitting: Allergy

## 2016-11-24 ENCOUNTER — Ambulatory Visit (INDEPENDENT_AMBULATORY_CARE_PROVIDER_SITE_OTHER): Payer: Medicare Other | Admitting: Allergy

## 2016-11-24 ENCOUNTER — Ambulatory Visit: Payer: Self-pay | Admitting: *Deleted

## 2016-11-24 VITALS — BP 120/70 | HR 60 | Temp 98.7°F | Resp 16

## 2016-11-24 DIAGNOSIS — J4540 Moderate persistent asthma, uncomplicated: Secondary | ICD-10-CM

## 2016-11-24 DIAGNOSIS — K219 Gastro-esophageal reflux disease without esophagitis: Secondary | ICD-10-CM | POA: Diagnosis not present

## 2016-11-24 DIAGNOSIS — J3081 Allergic rhinitis due to animal (cat) (dog) hair and dander: Secondary | ICD-10-CM

## 2016-11-24 NOTE — Progress Notes (Signed)
Follow-up Note  RE: Vanessa Harmon MRN: [id number] DOB: 1950-07-22 Date of Office Visit: 11/24/2016   History of present illness: Vanessa Harmon is a 66 y.o. female presenting today for follow-up of asthma, allergic rhinitis, reflux. She was last seen in the office on 08/24/2016. She has been doing a lot of vacationing this summer. She states in July she noted a lot of vacation hotels and places she has been staying in have been rather moldy and she has been doing a lot of physical activity.  She reports she needed to use her albuterol daily throughout July because of this. She does state though that she has noted a decrease in her morning cough and she has overall needed to use her albuterol less since she has been on [place]. She is and now about to every 8 week injections. She reports she tolerates the injections without any problems. She states she did stop the Spiriva after the last visit because she felt it was causing her to be hoarse. She still reports her voice is a bit hoarse and she does endorse postnasal drainage. She has been using her Flonase 1 spray each nostril twice a day as well as her Astelin 1 spray each nostril twice a day.  She does not take an antihistamine on a regular basis. She is getting allergy shots and she is in the buildup phase.  She did have a reaction after leaving the office following one of her allergy shots over the summer and she reports she felt short of breath and shaky. She took a Benadryl and the symptoms resolved.  She was decreased down on her allergy shot for the following visit however she is now back up to that dose that she reacted.  She states she does not take an antihistamine prior to her allergy shots. She continues to take omeprazole and ranitidine daily for reflux control.       Review of systems: Review of Systems  Constitutional: Negative for chills, fever and malaise/fatigue.  HENT: Positive for congestion. Negative for ear discharge,  ear pain, nosebleeds, sinus pain and sore throat.   Eyes: Negative for discharge and redness.  Respiratory: Positive for cough and shortness of breath. Negative for wheezing.   Cardiovascular: Negative for chest pain.  Gastrointestinal: Negative for abdominal pain, constipation, diarrhea, nausea and vomiting.  Musculoskeletal: Negative for joint pain and myalgias.  Skin: Negative for itching and rash.  Neurological: Negative for headaches.    All other systems negative unless noted above in HPI  Past medical/social/surgical/family history have been reviewed and are unchanged unless specifically indicated below.  No changes  Medication List: Allergies as of 11/24/2016      Reactions   Breo Ellipta [fluticasone Furoate-vilanterol]    unknown   Singulair [montelukast Sodium] Other (See Comments)   Suicide thoughts   Tape Rash   PAPER TAPE: Causes severe rash      Medication List       Accurate as of 11/24/16  2:40 PM. Always use your most recent med list.          aspirin 81 MG tablet Take 81 mg by mouth daily.   atenolol 25 MG tablet Commonly known as:  TENORMIN atenolol 25 mg tablet   atorvastatin 80 MG tablet Commonly known as:  LIPITOR TAKE 1/2 BY MOUTH ONCE DAILY   azelastine 0.1 % nasal spray Commonly known as:  ASTELIN INSTILL 1 SPRAY INTO EACH NOSTRIL TWICE DAILY   budesonide-formoterol 160-4.5  MCG/ACT inhaler Commonly known as:  SYMBICORT Inhale 2 puffs into the lungs 2 (two) times daily.   CALCIUM + D PO Take by mouth 2 (two) times daily.   chlorthalidone 25 MG tablet Commonly known as:  HYGROTON Take 12.5 mg by mouth daily.   dextromethorphan-guaiFENesin 30-600 MG 12hr tablet Commonly known as:  MUCINEX DM Take 1 tablet by mouth daily as needed for cough.   doxazosin 1 MG tablet Commonly known as:  CARDURA doxazosin 1 mg tablet   escitalopram 20 MG tablet Commonly known as:  LEXAPRO TAKE 1 TABLET BY MOUTH DAILY   FASENRA [place] Inject 1  Syringe into the skin every 30 (thirty) days.   FIBER CHOICE PO Take 1 capsule by mouth daily.   fish oil-omega-3 fatty acids 1000 MG capsule Take 1 g by mouth 2 (two) times daily.   flunisolide 25 MCG/ACT (0.025%) Soln Commonly known as:  NASALIDE flunisolide 25 mcg (0.025 %) nasal spray   fluticasone 50 MCG/ACT nasal spray Commonly known as:  FLONASE fluticasone 50 mcg/actuation nasal spray,suspension   fluticasone 50 MCG/ACT nasal spray Commonly known as:  FLONASE INSTILL TWO   SPRAYS INTO EACH NOSTRIL EVERY DAY   folic acid 1 MG tablet Commonly known as:  FOLVITE Take 1 mg by mouth daily.   furosemide 20 MG tablet Commonly known as:  LASIX Take 20 mg by mouth daily.   indomethacin 75 MG CR capsule Commonly known as:  INDOCIN SR TAKE 1 CAPSULE BY MOUTH DAILY   indomethacin 75 MG CR capsule Commonly known as:  INDOCIN SR TAKE 1 CAPSULE BY MOUTH ONCE DAILY   irbesartan 300 MG tablet Commonly known as:  AVAPRO Take 1 tablet (300 mg total) by mouth daily.   levofloxacin 500 MG tablet Commonly known as:  LEVAQUIN levofloxacin 500 mg tablet   LORazepam 1 MG tablet Commonly known as:  ATIVAN TAKE 1 TABLET BY MOUTH DAILY IN THE MORNING AND TWO TABLET AT BEDTIME   losartan 100 MG tablet Commonly known as:  COZAAR losartan 100 mg tablet   losartan-hydrochlorothiazide 100-12.5 MG tablet Commonly known as:  HYZAAR losartan 100 mg-hydrochlorothiazide 12.5 mg tablet   methotrexate 25 MG/ML injection Inject 25 mg into the skin once a week. 15 mg=0.8 ml  Every Wednesday   montelukast 10 MG tablet Commonly known as:  SINGULAIR montelukast 10 mg tablet   multivitamin capsule Take 1 capsule by mouth daily.   olmesartan 40 MG tablet Commonly known as:  BENICAR olmesartan 40 mg tablet   omeprazole 40 MG capsule Commonly known as:  PRILOSEC Take one capsule before breakfast daily   PROAIR HFA 108 (90 Base) MCG/ACT inhaler Generic drug:  albuterol INHALE TWO  PUFFS INTO THE LUNGS EVERY 4 HOURS AS NEEDED FOR WHEEZING OR SHORTNESS OF BREATH.   ranitidine 300 MG tablet Commonly known as:  ZANTAC Take one tablet at bedtime daily   STOOL SOFTENER PO Take 1 capsule by mouth as needed.   Tiotropium Bromide Monohydrate 1.25 MCG/ACT Aers Commonly known as:  SPIRIVA RESPIMAT Inhale 2 puffs into the lungs daily.   valACYclovir 1000 MG tablet Commonly known as:  VALTREX valacyclovir 1 gram tablet   valACYclovir 1000 MG tablet Commonly known as:  VALTREX   vitamin C 1000 MG tablet Take 1,000 mg by mouth daily.   ZIRGAN 0.15 % Gel Generic drug:  Ganciclovir   zolpidem 5 MG tablet Commonly known as:  AMBIEN zolpidem 5 mg tablet       Known  medication allergies: Allergies  Allergen Reactions  . Breo Ellipta [Fluticasone Furoate-Vilanterol]     unknown  . Singulair [Montelukast Sodium] Other (See Comments)    Suicide thoughts  . Tape Rash    PAPER TAPE: Causes severe rash     Physical examination: Blood pressure 120/70, pulse 60, temperature 98.7 F (37.1 C), temperature source Oral, resp. rate 16, SpO2 98 %.  General: Alert, interactive, in no acute distress. HEENT: PERRLA, TMs pearly gray, turbinates mildly edematous with clear discharge, post-pharynx non erythematous. Neck: Supple without lymphadenopathy. Lungs: Clear to auscultation without wheezing, rhonchi or rales. {no increased work of breathing. CV: Normal S1, S2 without murmurs. Abdomen: Nondistended, nontender. Skin: Warm and dry, without lesions or rashes. Extremities:  No clubbing, cyanosis or edema. Neuro:   Grossly intact.  Diagnositics/Labs: Spirometry: FEV1: 1.96L  80%, FVC: 3.17L  102%, ratio consistent with mild obstructive pattern  study is improved from previous   Assessment and plan:   Moderate persistent asthma - control is improving - Daily controller medication(s): Symbicort 160/4.5 two puffs twice daily - Continue Fasenra injections now every 8  weeks - Rescue medications: ProAir 4 puffs every 4-6 hours as needed - Asthma control goals:  * Full participation in all desired activities (may need albuterol before activity) * Albuterol use two time or less a week on average (not counting use with activity) * Cough interfering with sleep two time or less a month * Oral steroids no more than once a year * No hospitalizations  LPRD (laryngopharyngeal reflux disease) - Continue with omeprazole 40mg  and ranitidine 150mg  daily  Chronic allergic rhinitis - Continue with allergy shots at the same schedule -- take an antihistamine (Allegra, Zyrtec or Xyzal) on days of your allergy shot and use as needed - Continue with Flonase 1 spray twice a day + azelastine 2 sprays twice a day.  Return in about 4-6 months or sooner if needed   I appreciate the opportunity to take part in Amyjo's care. Please do not hesitate to contact me with questions.  Sincerely,   Prudy Feeler, MD Allergy/Immunology [facility]

## 2016-11-24 NOTE — Patient Instructions (Signed)
Moderate persistent asthma - Daily controller medication(s): Symbicort 160/4.5 two puffs twice daily - Continue Fasenra injections now every 8 weeks - Rescue medications: ProAir 4 puffs every 4-6 hours as needed - Asthma control goals:  * Full participation in all desired activities (may need albuterol before activity) * Albuterol use two time or less a week on average (not counting use with activity) * Cough interfering with sleep two time or less a month * Oral steroids no more than once a year * No hospitalizations  LPRD (laryngopharyngeal reflux disease) - Continue with omeprazole 40mg  and ranitidine 150mg  daily  Chronic allergic rhinitis - Continue with allergy shots at the same schedule -- take an antihistamine (Allegra, Zyrtec or Xyzal) on days of your allergy shot and use as needed - Continue with Flonase 1 spray twice a day + azelastine 2 sprays twice a day.  Return in about 4-6 months or sooner if needed

## 2016-11-25 DIAGNOSIS — R7989 Other specified abnormal findings of blood chemistry: Secondary | ICD-10-CM | POA: Diagnosis not present

## 2016-11-25 DIAGNOSIS — M5136 Other intervertebral disc degeneration, lumbar region: Secondary | ICD-10-CM | POA: Diagnosis not present

## 2016-11-25 DIAGNOSIS — M1990 Unspecified osteoarthritis, unspecified site: Secondary | ICD-10-CM | POA: Diagnosis not present

## 2016-11-25 DIAGNOSIS — M150 Primary generalized (osteo)arthritis: Secondary | ICD-10-CM | POA: Diagnosis not present

## 2016-11-28 ENCOUNTER — Other Ambulatory Visit: Payer: Self-pay | Admitting: Internal Medicine

## 2016-11-29 ENCOUNTER — Telehealth: Payer: Self-pay

## 2016-11-29 NOTE — Telephone Encounter (Signed)
Clearance request received from [facility] for both upper eyelid ptosis repair scheduled 9/4.  Per Dr. Radford Pax, patient will need to be seen by extender for surgical clearance. Scheduled patient for evaluation tomorrow with Richardson Dopp. Patient was grateful for call and agrees with treatment plan.

## 2016-11-30 ENCOUNTER — Ambulatory Visit (INDEPENDENT_AMBULATORY_CARE_PROVIDER_SITE_OTHER): Payer: Medicare Other

## 2016-11-30 ENCOUNTER — Encounter: Payer: Self-pay | Admitting: Physician Assistant

## 2016-11-30 ENCOUNTER — Ambulatory Visit (INDEPENDENT_AMBULATORY_CARE_PROVIDER_SITE_OTHER): Payer: Medicare Other | Admitting: Physician Assistant

## 2016-11-30 VITALS — BP 128/62 | HR 52 | Ht 66.0 in | Wt 185.0 lb

## 2016-11-30 DIAGNOSIS — I10 Essential (primary) hypertension: Secondary | ICD-10-CM | POA: Diagnosis not present

## 2016-11-30 DIAGNOSIS — Z01810 Encounter for preprocedural cardiovascular examination: Secondary | ICD-10-CM | POA: Diagnosis not present

## 2016-11-30 DIAGNOSIS — J309 Allergic rhinitis, unspecified: Secondary | ICD-10-CM

## 2016-11-30 DIAGNOSIS — R600 Localized edema: Secondary | ICD-10-CM

## 2016-11-30 NOTE — Patient Instructions (Addendum)
Medication Instructions:  No changes  Labwork: None   Testing/Procedures: None   Follow-Up: Your physician wants you to follow-up in: 6 MONTHS WITH DR. Mallie Snooks will receive a reminder letter in the mail two months in advance. If you don't receive a letter, please call our office to schedule the follow-up appointment.   Any Other Special Instructions Will Be Listed Below (If Applicable). You can get compression stockings in [place] at [employer] # [phone].   If you need a refill on your cardiac medications before your next appointment, please call your pharmacy.

## 2016-11-30 NOTE — Progress Notes (Signed)
Cardiology Office Note:    Date:  11/30/2016   ID:  Vanessa Harmon, DOB 1951-01-08, MRN [id number]  PCP:  Elby Showers, MD  Cardiologist:  Dr. Fransico Him    Referring MD: Jabier Mutton, MD   Chief Complaint  Patient presents with  . Pre-op Exam    Surgical clearance    History of Present Illness:    Vanessa Harmon is a 66 y.o. female who is being seen today for surgical clearance at the request of White, Vito Backers, MD.   She has a hx of poorly controlled HTN, PSVT. Last seen by Dr. Radford Pax 1/18. The patient has been intolerant to amlodipine due to peripheral edema and cannot take beta blockers or calcium channel blockers due to bradycardia. She has been followed in the HTN clinic. Her blood pressure is now controlled on a combination of valsartan and chlorthalidone. She also takes Lasix several times a week to help alleviate lower extremity edema. She now needs eyelid surgery for correction of ptosis.  Vanessa Harmon is here alone.  She needs blepharoplasty next month under IV sedation.  She is doing well without symptoms of chest pain. She is short of breath with some activities.  This is typical with her asthma without significant change.  She also has a chronic cough.  She denies syncope, orthopnea, paroxysmal nocturnal dyspnea.  She has noted LE edema over the last several months. She takes Furosemide to control her swelling.  She feels her L leg is larger than her R.  She has not had any worsening.  She does not have complete resolution with lying supine.    Preoperative Risk Assessment: Duke Activity Status Index (DASI) from [email or web address]  on 11/30/2016 ** All calculations should be rechecked by clinician prior to use ** RESULT SUMMARY: 36.7 points The higher the score (maximum 58.2), the higher the functional status.  7.25  METs  INPUTS: Take care of self -> 2.75 = Yes Walk indoors -> 1.75 = Yes Walk 1-2 blocks on level ground -> 2.75 = Yes Climb a flight of stairs or  walk up a hill -> 5.5 = Yes Run a short distance -> 0 = No Do light work around the house -> 2.7 = Yes Do moderate work around the house -> 3.5 = Yes Do heavy work around the house -> 8 = Yes Do yardwork -> 4.5 = Yes Have sexual relations -> 5.25 = Yes Participate in moderate recreational activities -> 0 = No Participate in strenuous sports -> 0 = No  Revised Cardiac Risk Index for Pre-Operative Risk from [email or web address]  on 11/30/2016 ** All calculations should be rechecked by clinician prior to use **  RESULT SUMMARY: 0 points Class I Risk  0.4 % Risk of Major Cardiac Event  INPUTS: High-risk surgery -> 0 = No History of ischemic heart disease -> 0 = No History of congestive heart failure -> 0 = No History of cerebrovascular disease -> 0 = No Pre-operative treatment with insulin -> 0 = No Pre-operative creatinine >2 mg/dL / 176.8 mol/L -> 0 = No   Prior CV studies:   The following studies were reviewed today:  Echo 09/19/12 Mild LVH, EF 55-60, normal wall motion, grade 1 diastolic dysfunction, mild MR, PASP 35  Past Medical History:  Diagnosis Date  . Adenomatous polyp   . Anxiety    takes Ativan daily as needed  . Arthritis    inflammatory arthritis (Dr. Dagoberto Ligas)  . Chronic back pain  scoliosis   . Constipation    takes miralax every other day  . Depression    takes Lexapro daily  . Essential hypertension, benign    takes Hyzaar and Metoprolol daily  . History of blood transfusion   . History of bronchitis 5 rys ago  . History of colon polyps   . History of shingles   . Hyperlipidemia    takes Atorvastatin daily  . Insomnia    takes Melatonin nightly  . Joint pain   . Pneumonia as a child  . PSVT (paroxysmal supraventricular tachycardia) ([place])   . Rhinitis, allergic    uses Flonase daily  . Urinary urgency     Past Surgical History:  Procedure Laterality Date  . APPENDECTOMY    . BACK SURGERY  2012   at age 89 d/t scoliosis  . BREAST  BIOPSY    . cataract surgery    . COLONOSCOPY    . KNEE ARTHROSCOPY Bilateral   . neuroma removed from foot     unsure of which foot  . SHOULDER SURGERY Right   . TONSILLECTOMY    . TOTAL HIP ARTHROPLASTY Right 04/15/2014   Procedure: RIGHT TOTAL HIP ARTHROPLASTY ANTERIOR APPROACH;  Surgeon: Renette Butters, MD;  Location: [facility];  Service: Orthopedics;  Laterality: Right;    Current Medications: Current Meds  Medication Sig  . Ascorbic Acid (VITAMIN C) 1000 MG tablet Take 1,000 mg by mouth daily.    Marland Kitchen aspirin 81 MG tablet Take 81 mg by mouth daily.  Marland Kitchen atorvastatin (LIPITOR) 80 MG tablet TAKE 1/2 BY MOUTH ONCE DAILY  . azelastine (ASTELIN) 0.1 % nasal spray Place 2 sprays into both nostrils 2 (two) times daily. Use in each nostril as directed  . Benralizumab (FASENRA [place]) Inject 1 Syringe into the skin every 30 (thirty) days.  . Calcium Carbonate-Vitamin D (CALCIUM + D PO) Take by mouth 2 (two) times daily.    . chlorthalidone (HYGROTON) 25 MG tablet Take 12.5 mg by mouth daily.  Marland Kitchen dextromethorphan-guaiFENesin (MUCINEX DM) 30-600 MG 12hr tablet Take 1 tablet by mouth daily as needed for cough.  Mariane Baumgarten Calcium (STOOL SOFTENER PO) Take 1 capsule by mouth as needed (CONSTIPATION).   Marland Kitchen escitalopram (LEXAPRO) 20 MG tablet TAKE 1 TABLET BY MOUTH DAILY  . fish oil-omega-3 fatty acids 1000 MG capsule Take 1 g by mouth 2 (two) times daily.    . flunisolide (NASALIDE) 25 MCG/ACT (0.025%) SOLN flunisolide 25 mcg (0.025 %) nasal spray  . fluticasone (FLONASE) 50 MCG/ACT nasal spray INSTILL TWO   SPRAYS INTO EACH NOSTRIL EVERY DAY  . folic acid (FOLVITE) 1 MG tablet TAKE ONE TABLET (2 MG TOTAL) BY MOUTH  TWICE DAILY  . furosemide (LASIX) 20 MG tablet Take 20 mg by mouth daily.   . indomethacin (INDOCIN SR) 75 MG CR capsule TAKE 1 CAPSULE BY MOUTH DAILY  . Inulin (FIBER CHOICE PO) Take 2 capsules by mouth daily.   . irbesartan (AVAPRO) 300 MG tablet Take 1 tablet (300 mg total) by mouth daily.    Marland Kitchen LORazepam (ATIVAN) 1 MG tablet TAKE 1 TABLET BY MOUTH DAILY IN THE MORNING AND TWO TABLET AT BEDTIME  . methotrexate 25 MG/ML injection Inject 25 mg into the skin once a week. 15 mg=0.8 ml  Every Wednesday  . Multiple Vitamin (MULTIVITAMIN) capsule Take 1 capsule by mouth daily.  Marland Kitchen omeprazole (PRILOSEC) 40 MG capsule Take one capsule before breakfast daily  . PROAIR HFA 108 (90 Base)  MCG/ACT inhaler INHALE TWO PUFFS INTO THE LUNGS EVERY 4 HOURS AS NEEDED FOR WHEEZING OR SHORTNESS OF BREATH.  . ranitidine (ZANTAC) 300 MG tablet Take one tablet at bedtime daily  . SYMBICORT 160-4.5 MCG/ACT inhaler INHALE TWO PUFFS INTO THE LUNGS TWO (TWO) TIMES DAILY.  . valACYclovir (VALTREX) 1000 MG tablet Take 1,000 mg by mouth daily.      Allergies:   Breo ellipta [fluticasone furoate-vilanterol]; Pollen extract; Singulair [montelukast sodium]; and Tape   Social History  Substance Use Topics  . Smoking status: Former Research scientist (life sciences)  . Smokeless tobacco: Never Used     Comment: hasn't smoked in 2yrs  . Alcohol use Yes     Comment: glass or 2 of wine nightly      Family Hx: The patient's family history includes Cancer in her mother; Stroke in her mother.  ROS:   Please see the history of present illness.    Review of Systems  Respiratory: Positive for cough.    All other systems reviewed and are negative.   EKGs/Labs/Other Test Reviewed:    EKG:  EKG is  ordered today.  The ekg ordered today demonstrates Sinus bradycardia, HR 52, normal axis, nonspecific ST-T wave changes, QTc 433 ms  Recent Labs: 02/01/2016: ALT 51; TSH 2.36 08/30/2016: BUN 28; Creatinine, Ser 1.01; Potassium 3.7; Sodium 134 09/05/2016: Hemoglobin 12.6; Platelets 219   Recent Lipid Panel Lab Results  Component Value Date/Time   CHOL 168 09/05/2016 10:05 AM   TRIG 146 09/05/2016 10:05 AM   HDL 52 09/05/2016 10:05 AM   CHOLHDL 3.2 09/05/2016 10:05 AM   LDLCALC 87 09/05/2016 10:05 AM    Physical Exam:    VS:  BP 128/62    Pulse (!) 52   Ht 5\' 6"  (1.676 m)   Wt 185 lb (83.9 kg)   SpO2 93%   BMI 29.86 kg/m     Wt Readings from Last 3 Encounters:  11/30/16 185 lb (83.9 kg)  09/06/16 185 lb (83.9 kg)  07/07/16 183 lb (83 kg)     Physical Exam  Constitutional: She is oriented to person, place, and time. She appears well-developed and well-nourished. No distress.  HENT:  Head: Normocephalic and atraumatic.  Eyes: No scleral icterus.  Neck: Normal range of motion. No JVD present.  Cardiovascular: Normal rate, regular rhythm, S1 normal, S2 normal and normal heart sounds.   No murmur heard. Pulmonary/Chest: Breath sounds normal. She has no wheezes. She has no rhonchi. She has no rales.  Abdominal: Soft. There is no tenderness.  Musculoskeletal: Edema: no pitting edema noted.  Multiple spider veins/varicosities noted in bilateral lower extremities  Neurological: She is alert and oriented to person, place, and time.  Skin: Skin is warm and dry.  Psychiatric: She has a normal mood and affect.    ASSESSMENT:    1. Preoperative cardiovascular examination   2. Essential hypertension   3. Bilateral leg edema    PLAN:    In order of problems listed above:  1. Preoperative cardiovascular examination -  She presents for preoperative cardiovascular evaluation prior to planned blepharoplasty for ptosis.  Overall, this is a low risk procedure and she will not be under general anesthesia.  Her risk of major cardiac complication is low at 2.4%. She is able to achieve greater than 4 METs as outlined above.  The patient does not have any unstable cardiac conditions.  According to [facility] and AHA guidelines, she requires no further cardiac workup prior to her noncardiac surgery  and should be at acceptable risk.  She may hold aspirin if needed for her procedure. This can be resumed postoperatively when felt to be safe by the surgeon.   2. Essential hypertension The patient's blood pressure is controlled on her current  regimen.  Continue current therapy.    3. Bilateral leg edema She does not have pitting edema on exam. I suspect that her lower extremity edema is mainly a result of venous insufficiency. We discussed propping her legs up and applying compression stockings.  Dispo:  Return in about 6 months (around 06/02/2017) for Routine Follow Up w/ Dr. Radford Pax.   Medication Adjustments/Labs and Tests Ordered: Current medicines are reviewed at length with the patient today.  Concerns regarding medicines are outlined above.  Tests Ordered: Orders Placed This Encounter  Procedures  . EKG 12-Lead   Medication Changes: No orders of the defined types were placed in this encounter.   Signed, Richardson Dopp, PA-C  11/30/2016 11:54 AM    [facility] [place], [place], [place]  [address] Phone: [phone]; Fax: [phone]

## 2016-12-06 ENCOUNTER — Ambulatory Visit (INDEPENDENT_AMBULATORY_CARE_PROVIDER_SITE_OTHER): Payer: Medicare Other | Admitting: *Deleted

## 2016-12-06 DIAGNOSIS — J309 Allergic rhinitis, unspecified: Secondary | ICD-10-CM | POA: Diagnosis not present

## 2016-12-13 ENCOUNTER — Ambulatory Visit: Payer: Self-pay

## 2016-12-13 ENCOUNTER — Ambulatory Visit (INDEPENDENT_AMBULATORY_CARE_PROVIDER_SITE_OTHER): Payer: Medicare Other

## 2016-12-13 DIAGNOSIS — J4540 Moderate persistent asthma, uncomplicated: Secondary | ICD-10-CM

## 2016-12-13 DIAGNOSIS — J4550 Severe persistent asthma, uncomplicated: Secondary | ICD-10-CM

## 2016-12-13 MED ORDER — BENRALIZUMAB 30 MG/ML ~~LOC~~ SOSY
30.0000 mg | PREFILLED_SYRINGE | SUBCUTANEOUS | Status: DC
  Administered 2016-12-13 – 2017-04-04 (×3): 30 mg via SUBCUTANEOUS

## 2016-12-20 DIAGNOSIS — H02413 Mechanical ptosis of bilateral eyelids: Secondary | ICD-10-CM | POA: Diagnosis not present

## 2016-12-20 DIAGNOSIS — H02423 Myogenic ptosis of bilateral eyelids: Secondary | ICD-10-CM | POA: Diagnosis not present

## 2016-12-23 ENCOUNTER — Ambulatory Visit (INDEPENDENT_AMBULATORY_CARE_PROVIDER_SITE_OTHER): Payer: Medicare Other

## 2016-12-23 DIAGNOSIS — J309 Allergic rhinitis, unspecified: Secondary | ICD-10-CM | POA: Diagnosis not present

## 2016-12-27 ENCOUNTER — Ambulatory Visit (INDEPENDENT_AMBULATORY_CARE_PROVIDER_SITE_OTHER): Payer: Medicare Other | Admitting: *Deleted

## 2016-12-27 DIAGNOSIS — J309 Allergic rhinitis, unspecified: Secondary | ICD-10-CM | POA: Diagnosis not present

## 2016-12-27 DIAGNOSIS — L03011 Cellulitis of right finger: Secondary | ICD-10-CM | POA: Diagnosis not present

## 2017-01-02 ENCOUNTER — Telehealth: Payer: Self-pay | Admitting: Cardiology

## 2017-01-02 NOTE — Telephone Encounter (Signed)
Rec'd from [place]. White MD forwarded 4 pages to Sueanne Margarita MD

## 2017-01-05 ENCOUNTER — Ambulatory Visit (INDEPENDENT_AMBULATORY_CARE_PROVIDER_SITE_OTHER): Payer: Medicare Other

## 2017-01-05 DIAGNOSIS — J309 Allergic rhinitis, unspecified: Secondary | ICD-10-CM

## 2017-01-12 ENCOUNTER — Other Ambulatory Visit: Payer: Self-pay | Admitting: Internal Medicine

## 2017-01-13 ENCOUNTER — Ambulatory Visit (INDEPENDENT_AMBULATORY_CARE_PROVIDER_SITE_OTHER): Payer: Medicare Other

## 2017-01-13 DIAGNOSIS — J309 Allergic rhinitis, unspecified: Secondary | ICD-10-CM

## 2017-01-18 ENCOUNTER — Ambulatory Visit (INDEPENDENT_AMBULATORY_CARE_PROVIDER_SITE_OTHER): Payer: Medicare Other | Admitting: *Deleted

## 2017-01-18 DIAGNOSIS — J309 Allergic rhinitis, unspecified: Secondary | ICD-10-CM | POA: Diagnosis not present

## 2017-01-31 ENCOUNTER — Other Ambulatory Visit: Payer: Medicare Other | Admitting: Internal Medicine

## 2017-02-02 ENCOUNTER — Encounter: Payer: Medicare Other | Admitting: Internal Medicine

## 2017-02-06 ENCOUNTER — Encounter: Payer: Medicare Other | Admitting: Internal Medicine

## 2017-02-07 ENCOUNTER — Ambulatory Visit (INDEPENDENT_AMBULATORY_CARE_PROVIDER_SITE_OTHER): Payer: Medicare Other | Admitting: *Deleted

## 2017-02-07 DIAGNOSIS — J4550 Severe persistent asthma, uncomplicated: Secondary | ICD-10-CM | POA: Diagnosis not present

## 2017-02-10 ENCOUNTER — Ambulatory Visit (INDEPENDENT_AMBULATORY_CARE_PROVIDER_SITE_OTHER): Payer: Medicare Other

## 2017-02-10 DIAGNOSIS — J309 Allergic rhinitis, unspecified: Secondary | ICD-10-CM

## 2017-02-12 ENCOUNTER — Encounter (HOSPITAL_BASED_OUTPATIENT_CLINIC_OR_DEPARTMENT_OTHER): Payer: Self-pay | Admitting: Emergency Medicine

## 2017-02-12 ENCOUNTER — Emergency Department (HOSPITAL_BASED_OUTPATIENT_CLINIC_OR_DEPARTMENT_OTHER): Payer: Medicare Other

## 2017-02-12 ENCOUNTER — Emergency Department (HOSPITAL_BASED_OUTPATIENT_CLINIC_OR_DEPARTMENT_OTHER)
Admission: EM | Admit: 2017-02-12 | Discharge: 2017-02-12 | Disposition: A | Discharge status: Home / Self Care | Payer: Medicare Other | Attending: Emergency Medicine | Admitting: Emergency Medicine

## 2017-02-12 DIAGNOSIS — I10 Essential (primary) hypertension: Secondary | ICD-10-CM | POA: Diagnosis not present

## 2017-02-12 DIAGNOSIS — L03115 Cellulitis of right lower limb: Secondary | ICD-10-CM | POA: Diagnosis not present

## 2017-02-12 DIAGNOSIS — M79671 Pain in right foot: Secondary | ICD-10-CM

## 2017-02-12 DIAGNOSIS — Z87891 Personal history of nicotine dependence: Secondary | ICD-10-CM | POA: Insufficient documentation

## 2017-02-12 DIAGNOSIS — G8929 Other chronic pain: Secondary | ICD-10-CM | POA: Insufficient documentation

## 2017-02-12 DIAGNOSIS — Z79899 Other long term (current) drug therapy: Secondary | ICD-10-CM | POA: Insufficient documentation

## 2017-02-12 DIAGNOSIS — Z7982 Long term (current) use of aspirin: Secondary | ICD-10-CM | POA: Insufficient documentation

## 2017-02-12 DIAGNOSIS — J45909 Unspecified asthma, uncomplicated: Secondary | ICD-10-CM | POA: Insufficient documentation

## 2017-02-12 DIAGNOSIS — M7989 Other specified soft tissue disorders: Secondary | ICD-10-CM | POA: Diagnosis not present

## 2017-02-12 DIAGNOSIS — S99921A Unspecified injury of right foot, initial encounter: Secondary | ICD-10-CM | POA: Diagnosis not present

## 2017-02-12 MED ORDER — ACETAMINOPHEN 500 MG PO TABS: 1000.0000 mg | ORAL_TABLET | Freq: Four times a day (QID) | ORAL | 0 refills | Status: DC | PRN

## 2017-02-12 MED ORDER — CEPHALEXIN 500 MG PO CAPS: 1000.0000 mg | ORAL_CAPSULE | Freq: Two times a day (BID) | ORAL | 0 refills | Status: DC

## 2017-02-12 MED ORDER — TRAMADOL HCL 50 MG PO TABS: 50.0000 mg | ORAL_TABLET | Freq: Four times a day (QID) | ORAL | 0 refills | Status: DC | PRN

## 2017-02-12 NOTE — ED Provider Notes (Signed)
[facility] EMERGENCY DEPARTMENT Provider Note   CSN: [id number] Arrival date & time: 02/12/17  1345     History   Chief Complaint Chief Complaint  Patient presents with  . Foot Pain    HPI Vanessa Harmon is a 66 y.o. female.  HPI Patient reports that she did injure her fifth toe on her right foot about 3 weeks ago.  She caught the small toe while she was packing a suitcase and it got purple and discolored.  It did seem to be improving.  She was not having too much difficulty with it.  3 days ago she started developing pain at the base of the second toe.  She reports the pain seems to localize around the second and third metatarsal.  She also feels the pain through to the sole of her foot.  No other secondary injury.  Patient does not have diabetes.  Has not been fever chills or other associated symptoms.  Is worse with weightbearing.  Becomes more swollen and discolored in a dependent position.  Pain does not extend into the arch or the toes.  Patient has been wrapping it, elevating and doing Epsom soaks without relief. Past Medical History:  Diagnosis Date  . Adenomatous polyp   . Anxiety    takes Ativan daily as needed  . Arthritis    inflammatory arthritis (Dr. Dagoberto Ligas)  . Chronic back pain    scoliosis   . Constipation    takes miralax every other day  . Depression    takes Lexapro daily  . Essential hypertension, benign    takes Hyzaar and Metoprolol daily  . History of blood transfusion   . History of bronchitis 5 rys ago  . History of colon polyps   . History of shingles   . Hyperlipidemia    takes Atorvastatin daily  . Insomnia    takes Melatonin nightly  . Joint pain   . Pneumonia as a child  . PSVT (paroxysmal supraventricular tachycardia) ([place])   . Rhinitis, allergic    uses Flonase daily  . Urinary urgency     Patient Active Problem List   Diagnosis Date Noted  . Asthma with acute exacerbation 05/10/2016  . Acute sinusitis  05/10/2016  . Chronic sinusitis 12/08/2015  . Polymyalgia rheumatica ([place]) 02/15/2015  . DJD (degenerative joint disease) 04/15/2014  . Lumbar spine scoliosis 11/28/2013  . Impaired glucose tolerance 12/19/2011  . Lumbar back pain 12/19/2011  . Non-seasonal allergic rhinitis 12/19/2011  . Hyperlipidemia 10/16/2010  . Depression 10/16/2010  . Lumbar spondylosis 10/16/2010  . GE reflux 10/16/2010  . Adenomatous colon polyp 10/16/2010  . History of paroxysmal supraventricular tachycardia 10/16/2010  . Hypertension 10/16/2010    Past Surgical History:  Procedure Laterality Date  . APPENDECTOMY    . BACK SURGERY  2012   at age 1 d/t scoliosis  . BREAST BIOPSY    . cataract surgery    . COLONOSCOPY    . KNEE ARTHROSCOPY Bilateral   . neuroma removed from foot     unsure of which foot  . SHOULDER SURGERY Right   . TONSILLECTOMY    . TOTAL HIP ARTHROPLASTY Right 04/15/2014   Procedure: RIGHT TOTAL HIP ARTHROPLASTY ANTERIOR APPROACH;  Surgeon: Renette Butters, MD;  Location: [facility];  Service: Orthopedics;  Laterality: Right;    OB History    No data available       Home Medications    Prior to Admission medications  Medication Sig Start Date End Date Taking? Authorizing Provider  acetaminophen (TYLENOL) 500 MG tablet Take 2 tablets (1,000 mg total) by mouth every 6 (six) hours as needed. 02/12/17   Charlesetta Shanks, MD  Ascorbic Acid (VITAMIN C) 1000 MG tablet Take 1,000 mg by mouth daily.      [provider]  aspirin 81 MG tablet Take 81 mg by mouth daily.    [provider]  atorvastatin (LIPITOR) 80 MG tablet TAKE 1/2 BY MOUTH ONCE DAILY 01/12/17   Elby Showers, MD  azelastine (ASTELIN) 0.1 % nasal spray Place 2 sprays into both nostrils 2 (two) times daily. Use in each nostril as directed    [provider]  Benralizumab (FASENRA [place]) Inject 1 Syringe into the skin every 30 (thirty) days.    [provider]  Calcium Carbonate-Vitamin  D (CALCIUM + D PO) Take by mouth 2 (two) times daily.      [provider]  cephALEXin (KEFLEX) 500 MG capsule Take 2 capsules (1,000 mg total) by mouth 2 (two) times daily. 02/12/17   Charlesetta Shanks, MD  chlorthalidone (HYGROTON) 25 MG tablet Take 12.5 mg by mouth daily.    [provider]  dextromethorphan-guaiFENesin (MUCINEX DM) 30-600 MG 12hr tablet Take 1 tablet by mouth daily as needed for cough.    [provider]  Docusate Calcium (STOOL SOFTENER PO) Take 1 capsule by mouth as needed (CONSTIPATION).     [provider]  escitalopram (LEXAPRO) 20 MG tablet TAKE 1 TABLET BY MOUTH DAILY 07/25/16   Elby Showers, MD  fish oil-omega-3 fatty acids 1000 MG capsule Take 1 g by mouth 2 (two) times daily.      [provider]  flunisolide (NASALIDE) 25 MCG/ACT (0.025%) SOLN flunisolide 25 mcg (0.025 %) nasal spray    [provider]  fluticasone (FLONASE) 50 MCG/ACT nasal spray INSTILL TWO   SPRAYS INTO EACH NOSTRIL EVERY DAY 10/13/16   Elby Showers, MD  folic acid (FOLVITE) 1 MG tablet TAKE ONE TABLET (2 MG TOTAL) BY MOUTH  TWICE DAILY    [provider]  furosemide (LASIX) 20 MG tablet Take 20 mg by mouth daily.     [provider]  indomethacin (INDOCIN SR) 75 MG CR capsule TAKE 1 CAPSULE BY MOUTH DAILY 02/29/16   Elby Showers, MD  Inulin (FIBER CHOICE PO) Take 2 capsules by mouth daily.     [provider]  irbesartan (AVAPRO) 300 MG tablet Take 1 tablet (300 mg total) by mouth daily. 11/21/16   Sueanne Margarita, MD  LORazepam (ATIVAN) 1 MG tablet TAKE 1 TABLET BY MOUTH DAILY IN THE MORNING AND TWO TABLET AT BEDTIME 11/15/16   Elby Showers, MD  methotrexate 25 MG/ML injection Inject 25 mg into the skin once a week. 15 mg=0.8 ml  Every Wednesday    [provider]  Multiple Vitamin (MULTIVITAMIN) capsule Take 1 capsule by mouth daily.    [provider]  omeprazole (PRILOSEC) 40 MG capsule Take one  capsule before breakfast daily 10/17/16   Kennith Gain, MD  PROAIR HFA 108 [phone] Base) MCG/ACT inhaler INHALE TWO PUFFS INTO THE LUNGS EVERY 4 HOURS AS NEEDED FOR WHEEZING OR SHORTNESS OF BREATH. 10/17/16   Kennith Gain, MD  ranitidine (ZANTAC) 300 MG tablet Take one tablet at bedtime daily 10/17/16   Kennith Gain, MD  SYMBICORT 160-4.5 MCG/ACT inhaler INHALE TWO PUFFS INTO THE LUNGS TWO (TWO) TIMES  DAILY. 11/28/16   Elby Showers, MD  traMADol (ULTRAM) 50 MG tablet Take 1 tablet (50 mg total) by mouth every 6 (six) hours as needed. 02/12/17   Charlesetta Shanks, MD  valACYclovir (VALTREX) 1000 MG tablet Take 1,000 mg by mouth daily.  09/02/16   [provider]    Family History Family History  Problem Relation Age of Onset  . Cancer Mother   . Stroke Mother     Social History Social History  Substance Use Topics  . Smoking status: Former Research scientist (life sciences)  . Smokeless tobacco: Never Used     Comment: hasn't smoked in 61yrs  . Alcohol use Yes     Comment: glass or 2 of wine nightly     Allergies   Breo ellipta [fluticasone furoate-vilanterol]; Pollen extract; Singulair [montelukast sodium]; and Tape   Review of Systems Review of Systems Constitutional: No fever no chills no malaise History: No shortness of breath no chest pain Physical Exam Updated Vital Signs BP (!) 157/104 (BP Location: Left Arm)   Pulse (!) 59   Temp 98.3 F (36.8 C) (Oral)   Resp 18   Ht 5\' 6"  (1.676 m)   Wt 82.1 kg (181 lb)   SpO2 98%   BMI 29.21 kg/m   Physical Exam  Constitutional: She is oriented to person, place, and time. She appears well-developed and well-nourished. No distress.  HENT:  Head: Normocephalic and atraumatic.  Eyes: EOM are normal.  Pulmonary/Chest: Effort normal.  Musculoskeletal: Normal range of motion. She exhibits tenderness. She exhibits no edema or deformity.  Tenderness over distal metatarsals 2 and 3 of right foot.  Mild swelling, mild  erythema of right as compared to left.  See attached images.  Neurological: She is alert and oriented to person, place, and time. She exhibits normal muscle tone. Coordination normal.  Skin: Skin is warm and dry.  Psychiatric: She has a normal mood and affect.           ED Treatments / Results  Labs (all labs ordered are listed, but only abnormal results are displayed) Labs Reviewed - No data to display  EKG  EKG Interpretation None       Radiology Dg Foot Complete Right  Result Date: 02/12/2017 CLINICAL DATA:  [place] 3 weeks ago injured 2nd digit; swelling anterior of foot around metatarsal region x 3 days; caught 5th toe on edge of suitcase and pain and discoloration EXAM: RIGHT FOOT COMPLETE - 3+ VIEW COMPARISON:  11/16/2015 FINDINGS: There is no evidence of fracture or dislocation. There is no evidence of arthropathy or other focal bone abnormality. Soft tissues are unremarkable. IMPRESSION: Negative. Electronically Signed   By: Nolon Nations M.D.   On: 02/12/2017 14:22    Procedures Procedures (including critical care time)  Medications Ordered in ED Medications - No data to display   Initial Impression / Assessment and Plan / ED Course  I have reviewed the triage vital signs and the nursing notes.  Pertinent labs & imaging results that were available during my care of the patient were reviewed by me and considered in my medical decision making (see chart for details).     Final Clinical Impressions(s) / ED Diagnoses   Final diagnoses:  Foot pain, right  Cellulitis of right lower extremity  Patient has pain in the forefoot without specific injury.  Was injury a few weeks ago but that seems to resolve.  There is no fracture identified on x-ray.  She does have some  mild erythema developing.  At this time have discussed the risks and benefits of empiric biotic initiation for possible early cellulitis.  Patient prefer to start antibiotics and will continue  to observe closely.  Return precautions are reviewed.  She will follow-up with orthopedics.  New Prescriptions New Prescriptions   ACETAMINOPHEN (TYLENOL) 500 MG TABLET    Take 2 tablets (1,000 mg total) by mouth every 6 (six) hours as needed.   CEPHALEXIN (KEFLEX) 500 MG CAPSULE    Take 2 capsules (1,000 mg total) by mouth 2 (two) times daily.   TRAMADOL (ULTRAM) 50 MG TABLET    Take 1 tablet (50 mg total) by mouth every 6 (six) hours as needed.     Charlesetta Shanks, MD 02/12/17 [phone]

## 2017-02-12 NOTE — ED Notes (Signed)
Pt given d/c instructions as per chart. Rx x 3 with precautions. Verbalizes understanding. No questions. 

## 2017-02-12 NOTE — ED Notes (Signed)
EDP at bedside  

## 2017-02-12 NOTE — ED Triage Notes (Signed)
R foot pain to top of the foot since Wednesday.

## 2017-02-16 ENCOUNTER — Encounter: Payer: Self-pay | Admitting: Internal Medicine

## 2017-02-16 ENCOUNTER — Ambulatory Visit (INDEPENDENT_AMBULATORY_CARE_PROVIDER_SITE_OTHER): Payer: Medicare Other | Admitting: Internal Medicine

## 2017-02-16 VITALS — BP 124/72 | HR 60 | Temp 97.6°F | Wt 186.0 lb

## 2017-02-16 DIAGNOSIS — L03116 Cellulitis of left lower limb: Secondary | ICD-10-CM | POA: Diagnosis not present

## 2017-02-16 DIAGNOSIS — L03115 Cellulitis of right lower limb: Secondary | ICD-10-CM | POA: Diagnosis not present

## 2017-02-16 MED ORDER — CEFTRIAXONE SODIUM 1 G IJ SOLR
1.0000 g | Freq: Once | INTRAMUSCULAR | Status: AC
  Administered 2017-02-16: 1 g via INTRAMUSCULAR

## 2017-02-16 NOTE — Progress Notes (Signed)
   Subjective:    Patient ID: Vanessa Harmon, female    DOB: 25-May-1950, 66 y.o.   MRN: [id number]  HPI 66 year old Female seen at [facility]  Emergency Department on October 28 for cellulitis right foot.  Apparently some 3 weeks ago she caught the small toe of her right foot on a suitcase as she was leaving her sister's home.  It apparently dislocated and was bruised but went back into place with her efforts.  Not sure of any other injury although dogs stepped on her feet frequently.  She is on methotrexate.  Some 3 days before her presentation to the emergency department she began to feel pain around her second and third metatarsals dorsal aspect.  Foot became red and it was painful to walk.  Became swollen.  Was found to be tender over second and third metatarsals right foot.  X-ray was negative for osteomyelitis or fracture.  Was treated with Keflex 500 mg 1000 mg twice daily and placed on tramadol for pain.  If there has been some improvement and she is here for follow-up.  No fever or shaking chills.    Review of Systems see above     Objective:   Physical Exam  There is redness dorsal aspect mid right foot over the metatarsals.  Increased warmth is present.      Assessment & Plan:  Cellulitis right foot  Plan: Switch from Keflex to Levaquin 500 mg daily for 10 days.  1 g IM Rocephin given.  CBC with differential drawn.  Follow-up early next week.  Addendum: CBC shows white count of 6800 with normal differential.

## 2017-02-17 ENCOUNTER — Telehealth: Payer: Self-pay | Admitting: Internal Medicine

## 2017-02-17 LAB — CBC WITH DIFFERENTIAL/PLATELET
Basophils Absolute: 20 cells/uL (ref 0–200)
Basophils Relative: 0.3 %
Eosinophils Absolute: 0 cells/uL — ABNORMAL LOW (ref 15–500)
Eosinophils Relative: 0 %
HCT: 37.4 % (ref 35.0–45.0)
Hemoglobin: 13.1 g/dL (ref 11.7–15.5)
Lymphs Abs: 904 cells/uL (ref 850–3900)
MCH: 34.9 pg — ABNORMAL HIGH (ref 27.0–33.0)
MCHC: 35 g/dL (ref 32.0–36.0)
MCV: 99.7 fL (ref 80.0–100.0)
MPV: 12.5 fL (ref 7.5–12.5)
Monocytes Relative: 9.1 %
Neutro Abs: 5256 cells/uL (ref 1500–7800)
Neutrophils Relative %: 77.3 %
Platelets: 249 10*3/uL (ref 140–400)
RBC: 3.75 10*6/uL — ABNORMAL LOW (ref 3.80–5.10)
RDW: 13.2 % (ref 11.0–15.0)
Total Lymphocyte: 13.3 %
WBC mixed population: 619 cells/uL (ref 200–950)
WBC: 6.8 10*3/uL (ref 3.8–10.8)

## 2017-02-17 LAB — TIQ-NTM

## 2017-02-17 MED ORDER — LEVOFLOXACIN 500 MG PO TABS: 500.0000 mg | ORAL_TABLET | Freq: Every day | ORAL | 0 refills | Status: DC

## 2017-02-17 NOTE — Telephone Encounter (Signed)
Call to patient.  Says foot is about the same.  Explained to her we would be using Levaquin instead of doxycycline because apparently doxycycline can increase effects of methotrexate.  I would like to try Levaquin instead.  She is aware and will get an first dose today.  Follow-up early next week.

## 2017-02-17 NOTE — Patient Instructions (Signed)
Levaquin 500 mg daily for 10 days.  1 g IM Rocephin given in office today.  CBC with differential drawn.  Return on Monday, November 5.  Addendum: CBC is normal

## 2017-02-20 ENCOUNTER — Encounter: Payer: Self-pay | Admitting: Internal Medicine

## 2017-02-20 ENCOUNTER — Ambulatory Visit (INDEPENDENT_AMBULATORY_CARE_PROVIDER_SITE_OTHER): Payer: Medicare Other | Admitting: *Deleted

## 2017-02-20 ENCOUNTER — Ambulatory Visit (INDEPENDENT_AMBULATORY_CARE_PROVIDER_SITE_OTHER): Payer: Medicare Other | Admitting: Internal Medicine

## 2017-02-20 VITALS — BP 110/68 | HR 58 | Temp 97.5°F | Wt 184.0 lb

## 2017-02-20 DIAGNOSIS — J309 Allergic rhinitis, unspecified: Secondary | ICD-10-CM | POA: Diagnosis not present

## 2017-02-20 DIAGNOSIS — L03115 Cellulitis of right lower limb: Secondary | ICD-10-CM | POA: Diagnosis not present

## 2017-02-24 ENCOUNTER — Ambulatory Visit (INDEPENDENT_AMBULATORY_CARE_PROVIDER_SITE_OTHER): Payer: Medicare Other | Admitting: Internal Medicine

## 2017-02-24 ENCOUNTER — Encounter: Payer: Self-pay | Admitting: Internal Medicine

## 2017-02-24 VITALS — BP 102/60 | HR 62 | Temp 97.6°F | Wt 187.0 lb

## 2017-02-24 DIAGNOSIS — L03115 Cellulitis of right lower limb: Secondary | ICD-10-CM | POA: Diagnosis not present

## 2017-02-24 MED ORDER — LEVOFLOXACIN 500 MG PO TABS: 500.0000 mg | ORAL_TABLET | Freq: Every day | ORAL | 0 refills | Status: DC

## 2017-02-24 NOTE — Progress Notes (Signed)
   Subjective:    Patient ID: Vanessa Harmon, female    DOB: 1950-11-21, 66 y.o.   MRN: [id number]  HPI In today to follow-up on cellulitis of right foot.  Remains on Levaquin.  Patient can see an improvement.  Less tenderness right foot dorsal aspect.  She is leaving to go to [place] for the Thanksgiving holidays and will be there about a week.  Is leaving on Monday, November 19.  She has 2 Levaquin tablets left.  I am going to refill her prescription prescription for 10 days.  She is to let me know late next week if she is not improving.  She continues to wear a post op shoe.  Previous x-ray plain film at emergency department has been negative for fracture or osteomyelitis    Review of Systems see above     Objective:   Physical Exam Still has point tenderness between first and second metatarsals dorsal aspect.  There is less erythema dorsal aspect of right foot.       Assessment & Plan:  Slowly resolving cellulitis right foot  Plan: Continue Levaquin for an additional 10 days.  Call by November 15

## 2017-02-24 NOTE — Patient Instructions (Signed)
Continue Levaquin for an additional 10 days and call if not getting better by November 15

## 2017-02-27 DIAGNOSIS — M5136 Other intervertebral disc degeneration, lumbar region: Secondary | ICD-10-CM | POA: Diagnosis not present

## 2017-02-27 DIAGNOSIS — L659 Nonscarring hair loss, unspecified: Secondary | ICD-10-CM | POA: Diagnosis not present

## 2017-02-27 DIAGNOSIS — M1990 Unspecified osteoarthritis, unspecified site: Secondary | ICD-10-CM | POA: Diagnosis not present

## 2017-02-27 DIAGNOSIS — M150 Primary generalized (osteo)arthritis: Secondary | ICD-10-CM | POA: Diagnosis not present

## 2017-02-27 DIAGNOSIS — R7989 Other specified abnormal findings of blood chemistry: Secondary | ICD-10-CM | POA: Diagnosis not present

## 2017-03-02 ENCOUNTER — Telehealth: Payer: Self-pay | Admitting: Internal Medicine

## 2017-03-02 NOTE — Telephone Encounter (Signed)
Pt is aware and has an orthopedist she will call and set up her appointment

## 2017-03-02 NOTE — Telephone Encounter (Signed)
The note from Rheumatologist said she was to hold her methotrexate.Is she doing that? I think she needs to see orthopedist? Does she have one?

## 2017-03-02 NOTE — Telephone Encounter (Signed)
Patient is calling; states that her foot is not better.  It is slightly better, but no where being better.  She has several days of medication left, but the foot is no where being better.  You had asked her to call with an update today.    Best number for contact:  [phone]

## 2017-03-03 DIAGNOSIS — M84375A Stress fracture, left foot, initial encounter for fracture: Secondary | ICD-10-CM | POA: Diagnosis not present

## 2017-03-11 NOTE — Progress Notes (Signed)
   Subjective:    Patient ID: Vanessa Harmon, female    DOB: Mar 03, 1951, 66 y.o.   MRN: [id number]  HPI She has been treated for cellulitis of the right foot since October 28.  Initially seen in the emergency department at [facility] at that time.  Apparently had injured her fifth toe right foot 3 weeks previously.  Caught her small time while packing a suitcase on the suitcase and it was dislocated and discolored.  Then developed pain at base of second toe.  X-ray did not show a stress fracture.  She is on methotrexate per her rheumatologist.  She had point tenderness over distal  second and third metatarsals right foot with some mild swelling and erythema.  Was placed on Keflex.  Followed up.  Initially November 1.  At that time was on Keflex and tramadol.  Switch to Levaquin for 10 days and given IM Rocephin.  White blood cell count was 6800.  She is planning to go to [facility] for several days around Thanksgiving.  She wants to be better by then.  There appears to be some improvement from November 1 2 in terms of erythema and tenderness.      Review of Systems  Constitutional:       No new symptoms  Respiratory: Negative.   Cardiovascular: Negative for chest pain and palpitations.  Gastrointestinal: Negative.   Genitourinary: Negative.   Musculoskeletal:       Persistent left foot pain       Objective:   Physical Exam Patient says dorsum of left foot is less tender.  There is less erythema but there is still some mild diffuse erythema dorsum of left foot.       Assessment & Plan:  Slowly improving cellulitis left foot.  Continue Levaquin.  Follow-up November 9

## 2017-03-11 NOTE — Patient Instructions (Signed)
Continue Levaquin 500 mg daily.  Follow-up November 9.

## 2017-03-16 ENCOUNTER — Ambulatory Visit (INDEPENDENT_AMBULATORY_CARE_PROVIDER_SITE_OTHER): Payer: Medicare Other | Admitting: *Deleted

## 2017-03-16 DIAGNOSIS — J309 Allergic rhinitis, unspecified: Secondary | ICD-10-CM | POA: Diagnosis not present

## 2017-03-17 ENCOUNTER — Ambulatory Visit (INDEPENDENT_AMBULATORY_CARE_PROVIDER_SITE_OTHER): Payer: Medicare Other | Admitting: Allergy

## 2017-03-17 ENCOUNTER — Other Ambulatory Visit: Payer: Self-pay | Admitting: Internal Medicine

## 2017-03-17 ENCOUNTER — Encounter: Payer: Self-pay | Admitting: Allergy

## 2017-03-17 VITALS — BP 112/70 | HR 56 | Resp 16

## 2017-03-17 DIAGNOSIS — K219 Gastro-esophageal reflux disease without esophagitis: Secondary | ICD-10-CM

## 2017-03-17 DIAGNOSIS — J3089 Other allergic rhinitis: Secondary | ICD-10-CM

## 2017-03-17 DIAGNOSIS — J4541 Moderate persistent asthma with (acute) exacerbation: Secondary | ICD-10-CM | POA: Diagnosis not present

## 2017-03-17 NOTE — Progress Notes (Signed)
Follow-up Note  RE: Vanessa Harmon MRN: [id number] DOB: [birth year] Date of Office Visit: 03/17/2017   History of present illness: Vanessa Harmon is a 66 y.o. female presenting today for sick visit.  She was last seen in the office on 11/24/16 by myself.  For the last few weeks she has had gradual worsening of her asthma symptoms with cough, wheezing, nighttime awakenings several nights a week.  She is needing to use her albuterol 1-2 times a day at this time.  She continues on symbicort twice a day and she is on Fasenra now at every 8 weeks.  Her next [name] is set for 06/05/17.  She feels the changes in weather have been a trigger and she also took a trip on Thanksgiving which she states likely didn't help with asthma symptoms.  She denies any fevers.   She continues to take omprezole and ranitidine for reflux control.   For allergy control she is on allergy shots and does use her xyzal as well as flonase and azelastine.  She still feels a bit nasal congestion.  She does do nasal saline most days of the week.   About a month ago she did injure her right foot and initially had an XR that was reported negative and she was treated for presumed cellulitis wit Keflex x 4 days with no improvement in symptoms.  She was then prescribe levaquin x 14 days again with no improvement in symptoms.  She had repeat XR and was found to have a right metatarsal fx and is in a soft boot.     Review of systems: Review of Systems  Constitutional: Positive for malaise/fatigue. Negative for chills and fever.  HENT: Positive for congestion. Negative for ear discharge, ear pain, nosebleeds, sinus pain and sore throat.   Eyes: Negative for pain, discharge and redness.  Respiratory: Positive for cough, shortness of breath and wheezing. Negative for sputum production.   Cardiovascular: Negative for chest pain.  Gastrointestinal: Negative for abdominal pain, constipation, diarrhea, heartburn, nausea and vomiting.    Musculoskeletal: Negative for joint pain.  Skin: Negative for itching and rash.  Neurological: Negative for headaches.    All other systems negative unless noted above in HPI  Past medical/social/surgical/family history have been reviewed and are unchanged unless specifically indicated below.  No changes  Medication List: Allergies as of 03/17/2017      Reactions   Breo Ellipta [fluticasone Furoate-vilanterol]    unknown   Singulair [montelukast Sodium] Other (See Comments)   Suicide thoughts   Tape Rash   PAPER TAPE: Causes severe rash      Medication List        Accurate as of 03/17/17  1:35 PM. Always use your most recent med list.          aspirin 81 MG tablet Take 81 mg by mouth daily.   atorvastatin 80 MG tablet Commonly known as:  LIPITOR TAKE 1/2 BY MOUTH ONCE DAILY   azelastine 0.1 % nasal spray Commonly known as:  ASTELIN Place 2 sprays into both nostrils 2 (two) times daily. Use in each nostril as directed   dextromethorphan-guaiFENesin 30-600 MG 12hr tablet Commonly known as:  MUCINEX DM Take 1 tablet by mouth daily as needed for cough.   escitalopram 20 MG tablet Commonly known as:  LEXAPRO TAKE 1 TABLET BY MOUTH DAILY   FASENRA [place] Inject 1 Syringe into the skin every 30 (thirty) days.   FIBER CHOICE PO Take 2 capsules  by mouth daily.   fish oil-omega-3 fatty acids 1000 MG capsule Take 1 g by mouth 2 (two) times daily.   fluticasone 50 MCG/ACT nasal spray Commonly known as:  FLONASE INSTILL TWO   SPRAYS INTO EACH NOSTRIL EVERY DAY   folic acid 1 MG tablet Commonly known as:  FOLVITE TAKE ONE TABLET (2 MG TOTAL) BY MOUTH  TWICE DAILY   furosemide 20 MG tablet Commonly known as:  LASIX TAKE 1 TABLET (20 MG TOTAL) BY MOUTH DAILY.   indomethacin 75 MG CR capsule Commonly known as:  INDOCIN SR TAKE 1 CAPSULE BY MOUTH DAILY   irbesartan 300 MG tablet Commonly known as:  AVAPRO Take 1 tablet (300 mg total) by mouth daily.    levofloxacin 500 MG tablet Commonly known as:  LEVAQUIN Take 1 tablet (500 mg total) daily by mouth.   LORazepam 1 MG tablet Commonly known as:  ATIVAN TAKE 1 TABLET BY MOUTH DAILY IN THE MORNING AND TWO TABLET AT BEDTIME   methotrexate 25 MG/ML injection Inject 25 mg into the skin once a week. 15 mg=0.8 ml  Every Wednesday   multivitamin capsule Take 1 capsule by mouth daily.   omeprazole 40 MG capsule Commonly known as:  PRILOSEC Take one capsule before breakfast daily   PROAIR HFA 108 (90 Base) MCG/ACT inhaler Generic drug:  albuterol INHALE TWO PUFFS INTO THE LUNGS EVERY 4 HOURS AS NEEDED FOR WHEEZING OR SHORTNESS OF BREATH.   ranitidine 300 MG tablet Commonly known as:  ZANTAC Take one tablet at bedtime daily   STOOL SOFTENER PO Take 1 capsule by mouth as needed (CONSTIPATION).   SYMBICORT 160-4.5 MCG/ACT inhaler Generic drug:  budesonide-formoterol INHALE TWO PUFFS INTO THE LUNGS TWO (TWO) TIMES DAILY.   valACYclovir 1000 MG tablet Commonly known as:  VALTREX Take 1,000 mg by mouth daily.   vitamin C 1000 MG tablet Take 1,000 mg by mouth daily.   Vitamin D3 1000 units Caps Take 5,000 Units by mouth daily.       Known medication allergies: Allergies  Allergen Reactions  . Breo Ellipta [Fluticasone Furoate-Vilanterol]     unknown  . Singulair [Montelukast Sodium] Other (See Comments)    Suicide thoughts  . Tape Rash    PAPER TAPE: Causes severe rash     Physical examination: Blood pressure 112/70, pulse (!) 56, resp. rate 16, SpO2 92 %.  General: Alert, interactive, in no acute distress. HEENT: PERRLA, TMs pearly gray, turbinates moderately edematous with clear discharge, post-pharynx non erythematous. Neck: Supple without lymphadenopathy. Lungs: Clear to auscultation without wheezing, rhonchi or rales. {no increased work of breathing.  Coughing throughout visit.  Post BD she had improved aeration throughout and coughing lessened.   CV: Normal  S1, S2 without murmurs. Abdomen: Nondistended, nontender. Skin: Warm and dry, without lesions or rashes. Extremities:  No clubbing, cyanosis or edema. Neuro:   Grossly intact.  Diagnositics/Labs:  Spirometry: FEV1: 1.69L  70% , FVC: 1.77L  57%, ratio consistent with restrictive pattern.  lung functions decreased from last visit.  No improvement following BD.   Assessment and plan:   Moderate persistent asthma with exacerbation - Daily controller medication(s): Symbicort 160/4.5 two puffs twice daily - will have you add Qvar 2 puffs twice a day to your Symbicort during asthma flares.  Use for 1-2 weeks until symptoms have improved then may stop use.   - if you are not feeling improved by Sun/Mon take prednisone pack as prescribed - Continue Fasenra injections now every  8 weeks-- next due in February 2019 - Rescue medications: ProAir 4 puffs every 4-6 hours as needed - Asthma control goals:  * Full participation in all desired activities (may need albuterol before activity) * Albuterol use two time or less a week on average (not counting use with activity) * Cough interfering with sleep two time or less a month * Oral steroids no more than once a year * No hospitalizations  LPRD (laryngopharyngeal reflux disease) - Continue with omeprazole 40mg  and ranitidine 150mg  daily  Chronic allergic rhinitis - Continue with allergy shots at the same schedule -- take an antihistamine (Allegra, Zyrtec or Xyzal) on days of your allergy shot and use as needed - Continue with Flonase 1 spray twice a day + azelastine 2 sprays twice a day. - continue nasal saline rinses daily  Return in about 3-4 months or sooner if needed  I appreciate the opportunity to take part in Tahirih's care. Please do not hesitate to contact me with questions.  Sincerely,   Prudy Feeler, MD Allergy/Immunology [facility]

## 2017-03-17 NOTE — Patient Instructions (Addendum)
Moderate persistent asthma with exacerbation - Daily controller medication(s): Symbicort 160/4.5 two puffs twice daily - will have you add Qvar 2 puffs twice a day to your Symbicort during asthma flares.  Use for 1-2 weeks until symptoms have improved then may stop use.   - if you are not feeling improved by Sun/Mon take prednisone pack as prescribed - Continue Fasenra injections now every 8 weeks-- next due in February 2019 - Rescue medications: ProAir 4 puffs every 4-6 hours as needed - Asthma control goals:  * Full participation in all desired activities (may need albuterol before activity) * Albuterol use two time or less a week on average (not counting use with activity) * Cough interfering with sleep two time or less a month * Oral steroids no more than once a year * No hospitalizations  LPRD (laryngopharyngeal reflux disease) - Continue with omeprazole 40mg  and ranitidine 150mg  daily  Chronic allergic rhinitis - Continue with allergy shots at the same schedule -- take an antihistamine (Allegra, Zyrtec or Xyzal) on days of your allergy shot and use as needed - Continue with Flonase 1 spray twice a day + azelastine 2 sprays twice a day. - continue nasal saline rinses daily  Return in about 3-4 months or sooner if needed

## 2017-03-23 ENCOUNTER — Ambulatory Visit (HOSPITAL_COMMUNITY)
Admission: RE | Admit: 2017-03-23 | Discharge: 2017-03-23 | Disposition: A | Discharge status: Home / Self Care | Payer: Medicare Other | Source: Ambulatory Visit | Attending: Allergy | Admitting: Allergy

## 2017-03-23 ENCOUNTER — Ambulatory Visit: Payer: Medicare Other | Admitting: Allergy

## 2017-03-23 ENCOUNTER — Encounter: Payer: Self-pay | Admitting: Allergy

## 2017-03-23 ENCOUNTER — Ambulatory Visit: Payer: Self-pay | Admitting: *Deleted

## 2017-03-23 VITALS — BP 128/86 | HR 61 | Ht 66.0 in | Wt 183.2 lb

## 2017-03-23 DIAGNOSIS — J45909 Unspecified asthma, uncomplicated: Secondary | ICD-10-CM | POA: Diagnosis not present

## 2017-03-23 DIAGNOSIS — K219 Gastro-esophageal reflux disease without esophagitis: Secondary | ICD-10-CM

## 2017-03-23 DIAGNOSIS — J4541 Moderate persistent asthma with (acute) exacerbation: Secondary | ICD-10-CM | POA: Diagnosis not present

## 2017-03-23 DIAGNOSIS — J3089 Other allergic rhinitis: Secondary | ICD-10-CM | POA: Diagnosis not present

## 2017-03-23 NOTE — Patient Instructions (Signed)
Moderate persistent asthma with exacerbation - Daily controller medication(s): Symbicort 160/4.5 two puffs twice daily - stop Qvar as not providing much relief - continue prednisone at 20mg  twice a day for the next 3 days - Continue Fasenra injections now every 8 weeks-- next due in February 2019 - Rescue medications: For the next several days use ProAir 2 puffs every 4 hours while awake then resume as needed use.   - will obtain CXR - avoid any strenuous activity - Asthma control goals:  * Full participation in all desired activities (may need albuterol before activity) * Albuterol use two time or less a week on average (not counting use with activity) * Cough interfering with sleep two time or less a month * Oral steroids no more than once a year * No hospitalizations  LPRD (laryngopharyngeal reflux disease) - Continue with omeprazole 40mg  and ranitidine 150mg  daily  Chronic allergic rhinitis - Continue with allergy shots at the same schedule -- take an antihistamine (Allegra, Zyrtec or Xyzal) on days of your allergy shot and use as needed - Continue with Flonase 1 spray twice a day + azelastine 2 sprays twice a day. - continue nasal saline rinses daily  Return in about 3-4 months or sooner if needed

## 2017-03-24 ENCOUNTER — Encounter: Payer: Self-pay | Admitting: Allergy

## 2017-03-24 NOTE — Progress Notes (Signed)
[name] [address] Dept: [phone]  FAMILY NURSE PRACTITIONER FOLLOW UP NOTE  Patient ID: Vanessa Harmon, female    DOB: 04-Mar-1951  Age: 67 y.o. MRN: [id number] Date of Office Visit: 03/23/2017  Assessment  Chief Complaint: Breathing Problem (Pt presents to discuss concerns of breathing issues. Pt is currently taking Prednisone and has continued using the QVAR as prescribed at her last OV.)  HPI Vanessa Harmon is a 66 year old female who presents to the clinic with concerns about her breathing. She was last seen in this office on 03/17/2017 by Dr. Nelva Bush. At that time she was reporting a gradual increase in her asthma symptoms that had occurred a few weeks prior to that visit. She had continued her Symbicort 160- 2 puffs twice a day, started Qvar 2 puffs a day, and started a prednisone taper pack on Monday with some improvement. She has been using her albuterol rescue inhaler 4 puffs twice a day for about 1 week. Over the last 2 days she has also started taking Mucinex and cetirizine in hopes that these would help her breathe better. She continues on her omeprazole and ranitidine regimen with no complaints of heartburn. She reluctantly admits that she has been working with the leaves outside of her house on Wednesday (yesterday).   At today's visit, she is reporting a decline in breathing that began this morning upon waking. She reports she has been compliant with all her medications and had started taking the prednisone as follows: 20 mg on Monday, 40 mg Tuesday, 40 mg Wednesday, and 20 mg so far today. She has been wheezing today with some chest tightness, and some shortness of breath. She reports that she "can't get a deep breath in" and she frequently feels like she needs to yawn to get enough breath. She also reports that while she was shopping at a large store she found herself performing pursed lip breathing to control her breathing. She denies chest pain,  fever, sweats, chills, lower extremity swelling, and sick contacts.    Drug Allergies:  Allergies  Allergen Reactions  . Breo Ellipta [Fluticasone Furoate-Vilanterol]     unknown  . Singulair [Montelukast Sodium] Other (See Comments)    Suicide thoughts  . Spiriva Handihaler [Tiotropium Bromide Monohydrate]   . Tape Rash    PAPER TAPE: Causes severe rash    Physical Exam: BP 128/86 (BP Location: Left Arm, Patient Position: Sitting, Cuff Size: Normal)   Pulse 61   Ht 5\' 6"  (1.676 m)   Wt 183 lb 3.2 oz (83.1 kg)   SpO2 96%   BMI 29.57 kg/m    Physical Exam  Constitutional: She is oriented to person, place, and time. She appears well-developed and well-nourished.  HENT:  Head: Normocephalic.  Right Ear: External ear normal.  Left Ear: External ear normal.  Nose: Nose normal.  Mouth/Throat: Oropharynx is clear and moist.  Eyes: Conjunctivae are normal.  Neck: Normal range of motion. Neck supple.  Cardiovascular: Normal rate, regular rhythm and normal heart sounds.  S1S2 normal. Heart rate and rhythm regular. No murmur noted.  Pulmonary/Chest: Effort normal and breath sounds normal.  Lungs clear to auscultation. No crackles or wheezes noted. Air movement equal bilaterally.  Musculoskeletal: Normal range of motion.  Neurological: She is alert and oriented to person, place, and time.  Skin: Skin is warm and dry.  Cap refill <3 sec. No BLE edema noted  Psychiatric: She has a normal mood and affect. Her  behavior is normal. Judgment and thought content normal.    Diagnostics:   FEV1 2.36, FVC 3.01, predicted FEV1 2.57, predicted FVC 3.36. Spirometry is in the normal range.   Assessment and Plan: 1. Moderate persistent asthma with acute exacerbation   2. Non-seasonal allergic rhinitis due to other allergic trigger   3. LPRD (laryngopharyngeal reflux disease)        Moderate persistent asthma with exacerbation - Daily controller medication(s): Symbicort 160/4.5 two puffs  twice daily - stop Qvar as not providing much relief - continue prednisone at 20mg  twice a day for the next 3 days - Continue Fasenra injections now every 8 weeks-- next due in February 2019 - Rescue medications: For the next several days use ProAir 2 puffs every 4 hours while awake then resume as needed use.   - will obtain CXR and will treat accordingly if any consolidations/effusions are present - avoid any strenuous activity - Asthma control goals:  * Full participation in all desired activities (may need albuterol before activity) * Albuterol use two time or less a week on average (not counting use with activity) * Cough interfering with sleep two time or less a month * Oral steroids no more than once a year * No hospitalizations  LPRD (laryngopharyngeal reflux disease) - Continue with omeprazole 40mg  and ranitidine 150mg  daily  Chronic allergic rhinitis - Continue with allergy shots at the same schedule -- take an antihistamine (Allegra, Zyrtec or Xyzal) on days of your allergy shot and use as needed - Continue with Flonase 1 spray twice a day + azelastine 2 sprays twice a day. - continue nasal saline rinses daily  Return in about 3-4 months or sooner if needed    Thank you for the opportunity to care for this patient.  Please do not hesitate to contact me with questions.  Gareth Morgan, FNP [facility] [place]: I performed/discussed the history and physical examination of the patient as well as management with NP Vidhi Delellis. I reviewed the NP's note and agree with the documented findings and plan of care with following additions/exceptions: agree with plan as outlined above.    Prudy Feeler, MD [facility]

## 2017-03-31 ENCOUNTER — Ambulatory Visit (INDEPENDENT_AMBULATORY_CARE_PROVIDER_SITE_OTHER): Payer: Medicare Other

## 2017-03-31 DIAGNOSIS — M84374D Stress fracture, right foot, subsequent encounter for fracture with routine healing: Secondary | ICD-10-CM | POA: Diagnosis not present

## 2017-03-31 DIAGNOSIS — J309 Allergic rhinitis, unspecified: Secondary | ICD-10-CM

## 2017-04-03 DIAGNOSIS — J3081 Allergic rhinitis due to animal (cat) (dog) hair and dander: Secondary | ICD-10-CM | POA: Diagnosis not present

## 2017-04-04 ENCOUNTER — Ambulatory Visit (INDEPENDENT_AMBULATORY_CARE_PROVIDER_SITE_OTHER): Payer: Medicare Other | Admitting: *Deleted

## 2017-04-04 DIAGNOSIS — J4550 Severe persistent asthma, uncomplicated: Secondary | ICD-10-CM | POA: Diagnosis not present

## 2017-04-05 ENCOUNTER — Other Ambulatory Visit: Payer: Self-pay | Admitting: Internal Medicine

## 2017-04-05 ENCOUNTER — Encounter: Payer: Self-pay | Admitting: *Deleted

## 2017-04-05 DIAGNOSIS — Z0000 Encounter for general adult medical examination without abnormal findings: Secondary | ICD-10-CM

## 2017-04-05 DIAGNOSIS — R7302 Impaired glucose tolerance (oral): Secondary | ICD-10-CM

## 2017-04-05 DIAGNOSIS — Z1321 Encounter for screening for nutritional disorder: Secondary | ICD-10-CM

## 2017-04-05 DIAGNOSIS — Z1329 Encounter for screening for other suspected endocrine disorder: Secondary | ICD-10-CM

## 2017-04-05 DIAGNOSIS — J301 Allergic rhinitis due to pollen: Secondary | ICD-10-CM | POA: Diagnosis not present

## 2017-04-05 DIAGNOSIS — I10 Essential (primary) hypertension: Secondary | ICD-10-CM

## 2017-04-05 DIAGNOSIS — E785 Hyperlipidemia, unspecified: Secondary | ICD-10-CM

## 2017-04-05 NOTE — Progress Notes (Signed)
VIALS MADE. EXP: 04-05-18. HV 

## 2017-04-06 ENCOUNTER — Ambulatory Visit (INDEPENDENT_AMBULATORY_CARE_PROVIDER_SITE_OTHER): Payer: Medicare Other

## 2017-04-06 DIAGNOSIS — J309 Allergic rhinitis, unspecified: Secondary | ICD-10-CM | POA: Diagnosis not present

## 2017-04-13 ENCOUNTER — Other Ambulatory Visit: Payer: Medicare Other | Admitting: Internal Medicine

## 2017-04-13 ENCOUNTER — Ambulatory Visit (INDEPENDENT_AMBULATORY_CARE_PROVIDER_SITE_OTHER): Payer: Medicare Other | Admitting: *Deleted

## 2017-04-13 DIAGNOSIS — R7302 Impaired glucose tolerance (oral): Secondary | ICD-10-CM | POA: Diagnosis not present

## 2017-04-13 DIAGNOSIS — Z1321 Encounter for screening for nutritional disorder: Secondary | ICD-10-CM

## 2017-04-13 DIAGNOSIS — E785 Hyperlipidemia, unspecified: Secondary | ICD-10-CM | POA: Diagnosis not present

## 2017-04-13 DIAGNOSIS — M797 Fibromyalgia: Secondary | ICD-10-CM | POA: Diagnosis not present

## 2017-04-13 DIAGNOSIS — Z1329 Encounter for screening for other suspected endocrine disorder: Secondary | ICD-10-CM

## 2017-04-13 DIAGNOSIS — Z0000 Encounter for general adult medical examination without abnormal findings: Secondary | ICD-10-CM

## 2017-04-13 DIAGNOSIS — J309 Allergic rhinitis, unspecified: Secondary | ICD-10-CM | POA: Diagnosis not present

## 2017-04-13 DIAGNOSIS — I10 Essential (primary) hypertension: Secondary | ICD-10-CM | POA: Diagnosis not present

## 2017-04-13 DIAGNOSIS — E782 Mixed hyperlipidemia: Secondary | ICD-10-CM | POA: Diagnosis not present

## 2017-04-13 DIAGNOSIS — J82 Pulmonary eosinophilia, not elsewhere classified: Secondary | ICD-10-CM | POA: Diagnosis not present

## 2017-04-13 DIAGNOSIS — M1990 Unspecified osteoarthritis, unspecified site: Secondary | ICD-10-CM | POA: Diagnosis not present

## 2017-04-14 LAB — COMPLETE METABOLIC PANEL WITH GFR
AG Ratio: 2.2 (calc) (ref 1.0–2.5)
ALT: 36 U/L — ABNORMAL HIGH (ref 6–29)
AST: 24 U/L (ref 10–35)
Albumin: 4.2 g/dL (ref 3.6–5.1)
Alkaline phosphatase (APISO): 88 U/L (ref 33–130)
BUN/Creatinine Ratio: 16 (calc) (ref 6–22)
BUN: 16 mg/dL (ref 7–25)
CO2: 31 mmol/L (ref 20–32)
Calcium: 9.4 mg/dL (ref 8.6–10.4)
Chloride: 101 mmol/L (ref 98–110)
Creat: 1.02 mg/dL — ABNORMAL HIGH (ref 0.50–0.99)
GFR, Est African American: 66 mL/min/{1.73_m2} (ref >=60)
GFR, Est Non African American: 57 mL/min/{1.73_m2} — ABNORMAL LOW (ref >=60)
Globulin: 1.9 g/dL (calc) (ref 1.9–3.7)
Glucose, Bld: 132 mg/dL — ABNORMAL HIGH (ref 65–99)
Potassium: 3.9 mmol/L (ref 3.5–5.3)
Sodium: 140 mmol/L (ref 135–146)
Total Bilirubin: 0.5 mg/dL (ref 0.2–1.2)
Total Protein: 6.1 g/dL (ref 6.1–8.1)

## 2017-04-14 LAB — CBC WITH DIFFERENTIAL/PLATELET
Basophils Absolute: 11 cells/uL (ref 0–200)
Basophils Relative: 0.2 %
Eosinophils Absolute: 0 cells/uL — ABNORMAL LOW (ref 15–500)
Eosinophils Relative: 0 %
HCT: 37.5 % (ref 35.0–45.0)
Hemoglobin: 13.3 g/dL (ref 11.7–15.5)
Lymphs Abs: 1034 cells/uL (ref 850–3900)
MCH: 34.5 pg — ABNORMAL HIGH (ref 27.0–33.0)
MCHC: 35.5 g/dL (ref 32.0–36.0)
MCV: 97.4 fL (ref 80.0–100.0)
MPV: 11.6 fL (ref 7.5–12.5)
Monocytes Relative: 6.9 %
Neutro Abs: 3890 cells/uL (ref 1500–7800)
Neutrophils Relative %: 73.4 %
Platelets: 201 10*3/uL (ref 140–400)
RBC: 3.85 10*6/uL (ref 3.80–5.10)
RDW: 13.5 % (ref 11.0–15.0)
Total Lymphocyte: 19.5 %
WBC mixed population: 366 cells/uL (ref 200–950)
WBC: 5.3 10*3/uL (ref 3.8–10.8)

## 2017-04-14 LAB — HEMOGLOBIN A1C
Hgb A1c MFr Bld: 5.9 % of total Hgb — ABNORMAL HIGH (ref <5.7)
Mean Plasma Glucose: 123 (calc)
eAG (mmol/L): 6.8 (calc)

## 2017-04-14 LAB — LIPID PANEL
Cholesterol: 214 mg/dL — ABNORMAL HIGH (ref <200)
HDL: 48 mg/dL — ABNORMAL LOW (ref >50)
LDL Cholesterol (Calc): 126 mg/dL (calc) — ABNORMAL HIGH
Non-HDL Cholesterol (Calc): 166 mg/dL (calc) — ABNORMAL HIGH (ref <130)
Total CHOL/HDL Ratio: 4.5 (calc) (ref <5.0)
Triglycerides: 262 mg/dL — ABNORMAL HIGH (ref <150)

## 2017-04-14 LAB — VITAMIN D 25 HYDROXY (VIT D DEFICIENCY, FRACTURES): Vit D, 25-Hydroxy: 46 ng/mL (ref 30–100)

## 2017-04-14 LAB — TSH: TSH: 3.36 mIU/L (ref 0.40–4.50)

## 2017-04-19 ENCOUNTER — Other Ambulatory Visit: Payer: Self-pay | Admitting: Internal Medicine

## 2017-04-19 DIAGNOSIS — M1711 Unilateral primary osteoarthritis, right knee: Secondary | ICD-10-CM | POA: Diagnosis not present

## 2017-04-19 DIAGNOSIS — M25562 Pain in left knee: Secondary | ICD-10-CM | POA: Diagnosis not present

## 2017-04-19 DIAGNOSIS — M1712 Unilateral primary osteoarthritis, left knee: Secondary | ICD-10-CM | POA: Diagnosis not present

## 2017-04-19 DIAGNOSIS — M25561 Pain in right knee: Secondary | ICD-10-CM | POA: Diagnosis not present

## 2017-04-20 ENCOUNTER — Encounter: Payer: Self-pay | Admitting: Internal Medicine

## 2017-04-20 ENCOUNTER — Ambulatory Visit (INDEPENDENT_AMBULATORY_CARE_PROVIDER_SITE_OTHER): Payer: Medicare Other | Admitting: Internal Medicine

## 2017-04-20 VITALS — BP 102/70 | HR 69 | Ht 66.0 in | Wt 190.0 lb

## 2017-04-20 DIAGNOSIS — Z96641 Presence of right artificial hip joint: Secondary | ICD-10-CM

## 2017-04-20 DIAGNOSIS — M1990 Unspecified osteoarthritis, unspecified site: Secondary | ICD-10-CM

## 2017-04-20 DIAGNOSIS — M79652 Pain in left thigh: Secondary | ICD-10-CM | POA: Diagnosis not present

## 2017-04-20 DIAGNOSIS — J8283 Eosinophilic asthma: Secondary | ICD-10-CM

## 2017-04-20 DIAGNOSIS — Z8659 Personal history of other mental and behavioral disorders: Secondary | ICD-10-CM | POA: Diagnosis not present

## 2017-04-20 DIAGNOSIS — J82 Pulmonary eosinophilia, not elsewhere classified: Secondary | ICD-10-CM

## 2017-04-20 DIAGNOSIS — Z0000 Encounter for general adult medical examination without abnormal findings: Secondary | ICD-10-CM

## 2017-04-20 DIAGNOSIS — I10 Essential (primary) hypertension: Secondary | ICD-10-CM | POA: Diagnosis not present

## 2017-04-20 DIAGNOSIS — M79651 Pain in right thigh: Secondary | ICD-10-CM

## 2017-04-20 DIAGNOSIS — G4709 Other insomnia: Secondary | ICD-10-CM | POA: Diagnosis not present

## 2017-04-20 DIAGNOSIS — E782 Mixed hyperlipidemia: Secondary | ICD-10-CM | POA: Diagnosis not present

## 2017-04-20 DIAGNOSIS — J0100 Acute maxillary sinusitis, unspecified: Secondary | ICD-10-CM

## 2017-04-20 DIAGNOSIS — M797 Fibromyalgia: Secondary | ICD-10-CM

## 2017-04-20 DIAGNOSIS — Z86010 Personal history of colonic polyps: Secondary | ICD-10-CM | POA: Diagnosis not present

## 2017-04-20 DIAGNOSIS — R7302 Impaired glucose tolerance (oral): Secondary | ICD-10-CM

## 2017-04-20 LAB — POCT URINALYSIS DIPSTICK
Appearance: NORMAL
Bilirubin, UA: NEGATIVE
Blood, UA: NEGATIVE
Glucose, UA: NEGATIVE
Ketones, UA: NEGATIVE
Leukocytes, UA: NEGATIVE
Nitrite, UA: NEGATIVE
Odor: NORMAL
Protein, UA: NEGATIVE
Spec Grav, UA: 1.01 (ref 1.010–1.025)
Urobilinogen, UA: 0.2 E.U./dL
pH, UA: 6 (ref 5.0–8.0)

## 2017-04-20 MED ORDER — AMITRIPTYLINE HCL 10 MG PO TABS: 10.0000 mg | ORAL_TABLET | Freq: Every day | ORAL | 1 refills | Status: DC

## 2017-04-20 NOTE — Progress Notes (Signed)
Subjective:    Patient ID: Vanessa Harmon, female    DOB: 01-Dec-1950, 67 y.o.   MRN: [id number]  HPI 67 year old Female for health maintenance exam, Medicare Wellness, and evaluation of medical issues.  Has been told has fibromyalgia shoulders, hips knees, thighs. Hx of inflammatory arthritis. Is on Lexapro.  Saw Dr. [name] who injected knees and told her she needed knee replacement.  Pain in thighs is aggravating. May benefit from PT.  Still on MTX.  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 Has had several episodes of asthma. Tried Anna Genre and Breo both of which caused laryngitis. Coughs a lot.   Complaining of left ear pain x 2 weeks and has bleeding of ear. Left sinus pain and headache.  She really is not very happy right now.  We talked about this is here for a discussion whether.  She says she simply does not feel well and cannot really travel and she would like to do.  I think she probably needs a med consult from psychiatry to help with her dysthymia and depression.  Dr. Amil Amen is a Rheumatologist. Her blood pressure is elevated today at 148/90.  She has a history of dysthymia, essential hypertension, hyperlipidemia, adenomatous colon polyps, GE reflux, paroxysmal supraventricular tachycardia and back pain.  Impaired glucose tolerance likely related to prednisone therapy.  She has tried many medications for hypertension and did not tolerate Altace or Cardura.  Colonoscopy done by Dr. Cristina Gong in 2005.  Our records indicate had a colonoscopy in November 2011 by Dr. Earlean Shawl but do not see report in EPIC  Fractured right wrist 2009, right shoulder surgery for labral tear June 2006, right knee arthroscopic surgery by Dr. Para March 2006.  Left knee arthroscopic surgery in [place] in the mid 1990s.  Left breast biopsy 1996 or 1997.  Blepharoplasty in the early 1990s.  Remote history of fractured sacrum after falling in the late 1980s.  Exploratory laparoscopy and  appendectomy 1973.  Tonsillectomy at age 12.  Morton's neuroma removed from right foot in junior high school.  Social history: She is married.  Does not smoke.  Rarely drinks alcohol.  She has no children of her own.  [place] [employer].  She is retired from [employer].  She has shown and bred dogs for many years.  We saw her in November for foot pain after trauma to her foot.  I thought initially it was a cellulitis.  There was no fracture on initial x-ray in the emergency department.  Subsequently was found to have a stress fracture was seen by orthopedist.  This is now healed.  Her allergist did chest x-ray in December which was normal.  Family history: Father died at age 19 with hypertension, stroke, peptic ulcer disease and prostate cancer.  Mother with history of stroke, atrial fibrillation, hypertension, valve replacement.  Mother with history of rheumatic fever.  One brother and one sister in good health.         Review of Systems  Constitutional: Positive for fatigue.  Respiratory: Positive for cough.   Cardiovascular: Negative for chest pain.  Musculoskeletal: Positive for arthralgias.  Allergic/Immunologic: Positive for environmental allergies.  Psychiatric/Behavioral: Positive for dysphoric mood.       Insomnia, dysthymia       Objective:   Physical Exam  Constitutional: She is oriented to person, place, and time. She appears well-developed and well-nourished.  HENT:  Head: Normocephalic and atraumatic.  Neck: Neck supple. No JVD  present. No thyromegaly present.  Cardiovascular: Normal rate, regular rhythm and normal heart sounds.  No murmur heard. Pulmonary/Chest: She has no wheezes. She has no rales.  Breasts normal female  Abdominal: Soft. Bowel sounds are normal. She exhibits no distension. There is no tenderness. There is no rebound and no guarding.  Genitourinary:  Genitourinary Comments: Deferred  Lymphadenopathy:    She has no  cervical adenopathy.  Neurological: She is alert and oriented to person, place, and time. She has normal reflexes.  Skin: Skin is warm and dry.  Psychiatric:   dysthymic and depressed mood  Vitals reviewed.         Assessment & Plan:  Eosinophilic asthma  Fibromyalgia  Quadriceps pain  Insomnia  Dysthymia  Hypertension  Stress fracture foot-much improved-needs bone density study  Depression  Insomnia  Plan: Arrange for psychiatric consultation.  Consider physical therapy for musculoskeletal pain.  Try Elavil for insomnia.  Her hemoglobin A1c is 5.9% and previously 5.6%.  LDL is 126, triglycerides high at 262 cholesterol at 214.  Fasting glucose is 132.  She has not been able to exercise due to her foot issues.  Lipids need to be rechecked in 6 months.  She needs to take her Lipitor on a regular basis and follow a low-fat diet.  Subjective:   Patient presents for Medicare Annual/Subsequent preventive examination.  Review Past Medical/Family/Social:   Risk Factors  Current exercise habits:  Dietary issues discussed:   Cardiac risk factors:  Depression Screen  (Note: if answer to either of the following is "Yes", a more complete depression screening is indicated)   Over the past two weeks, have you felt down, depressed or hopeless?  yes Over the past two weeks, have you felt little interest or pleasure in doing things? Yes because I do not feel good Have you lost interest or pleasure in daily life? No Do you often feel hopeless? No Do you cry easily over simple problems?  Yes  Activities of Daily Living  In your present state of health, do you have any difficulty performing the following activities?:   Driving? No  Managing money? No  Feeding yourself? No  Getting from bed to chair? No  Climbing a flight of stairs?  yes due to musculoskeletal pain Preparing food and eating?: No  Bathing or showering? No  Getting dressed: No  Getting to the toilet? No    Using the toilet:No  Moving around from place to place: No  In the past year have you fallen or had a near fall?: yes Are you sexually active?  Yes Do you have more than one partner? No   Hearing Difficulties: No  Do you often ask people to speak up or repeat themselves?  he has Do you experience ringing or noises in your ears?  yes Do you have difficulty understanding soft or whispered voices?  Yes Do you feel that you have a problem with memory? No Do you often misplace items? No    Home Safety:  Do you have a smoke alarm at your residence? Yes Do you have grab bars in the bathroom?  Yes Do you have throw rugs in your house?  Yes   Cognitive Testing  Alert? Yes Normal Appearance?Yes  Oriented to person? Yes Place? Yes  Time? Yes  Recall of three objects? Yes  Can perform simple calculations? Yes  Displays appropriate judgment?Yes  Can read the correct time from a watch face?Yes   List the Names of Other Physician/Practitioners  you currently use:  See referral list for the physicians patient is currently seeing.     Review of Systems: See above   Objective:     General appearance: Appears stated age  Head: Normocephalic, without obvious abnormality, atraumatic  Eyes: conj clear, EOMi PEERLA  Ears: normal TM's and external ear canals both ears  Nose: Nares normal. Septum midline. Mucosa normal. No drainage or sinus tenderness.  Throat: lips, mucosa, and tongue normal; teeth and gums normal  Neck: no adenopathy, no carotid bruit, no JVD, supple, symmetrical, trachea midline and thyroid not enlarged, symmetric, no tenderness/mass/nodules  No CVA tenderness.  Lungs: clear to auscultation bilaterally  Breasts: normal appearance, no masses or tenderness Heart: regular rate and rhythm, S1, S2 normal, no murmur, click, rub or gallop  Abdomen: soft, non-tender; bowel sounds normal; no masses, no organomegaly  Musculoskeletal: ROM normal in all joints, no crepitus, no  deformity, Normal muscle strengthen. Back  is symmetric, no curvature. Skin: Skin color, texture, turgor normal. No rashes or lesions  Lymph nodes: Cervical, supraclavicular, and axillary nodes normal.  Neurologic: CN 2 -12 Normal, Normal symmetric reflexes. Normal coordination and gait  Psych: Alert & Oriented x 3, Mood appear stable.    Assessment:    Annual wellness medicare exam   Plan:    During the course of the visit the patient was educated and counseled about appropriate screening and preventive services including:   Annual mammogram  Bone density study     Patient Instructions (the written plan) was given to the patient.  Medicare Attestation  I have personally reviewed:  The patient's medical and social history  Their use of alcohol, tobacco or illicit drugs  Their current medications and supplements  The patient's functional ability including ADLs,fall risks, home safety risks, cognitive, and hearing and visual impairment  Diet and physical activities  Evidence for depression or mood disorders  The patient's weight, height, BMI, and visual acuity have been recorded in the chart. I have made referrals, counseling, and provided education to the patient based on review of the above and I have provided the patient with a written personalized care plan for preventive services.

## 2017-04-25 ENCOUNTER — Telehealth: Payer: Self-pay | Admitting: Internal Medicine

## 2017-04-25 ENCOUNTER — Telehealth: Payer: Self-pay | Admitting: Allergy

## 2017-04-25 NOTE — Telephone Encounter (Signed)
We can have her try either Dulera 200 or Advair HFA 230 if she has not tried either of these and if either is covered for her.

## 2017-04-25 NOTE — Telephone Encounter (Signed)
Pt called and needs to speak with one of the nurses about her symbicort inhaler.[phone]

## 2017-04-25 NOTE — Telephone Encounter (Signed)
I spoke with the patient and she states that she has been on Symbicort for quite some time now. Says that when she began taking the Symbicort she started to have tremors that did seem to worsen. She discontinued the Symbicort for a couple of weeks and the tremors stopped. She started it back after a couple of weeks and the tremors returned. She has now been off of the Symbicort for 3-4 days and has had no tremors. Please advise of further recommendations.

## 2017-04-25 NOTE — Progress Notes (Signed)
Pt has not heard from [facility] yet. Is to call them today.

## 2017-04-25 NOTE — Telephone Encounter (Signed)
CT - maxofacial (sinuses) has been scheduled for Friday 1/11 @ 12:30.    Provided Dr. Casimiro Needle number for patient.  She has to call and scheduled that appointment herself.  States Elavil has not helped her to go to sleep yet, although she IS feeling much better.  She will continue to work on the sleep part.    FYI.

## 2017-04-26 MED ORDER — FLUTICASONE-SALMETEROL 230-21 MCG/ACT IN AERO: 2.0000 | INHALATION_SPRAY | Freq: Two times a day (BID) | RESPIRATORY_TRACT | 5 refills | Status: DC

## 2017-04-26 NOTE — Telephone Encounter (Signed)
Prescription for Advair 230 has been sent in after speaking with the patient.

## 2017-04-27 ENCOUNTER — Other Ambulatory Visit: Payer: Self-pay | Admitting: Allergy

## 2017-04-27 ENCOUNTER — Ambulatory Visit (INDEPENDENT_AMBULATORY_CARE_PROVIDER_SITE_OTHER): Payer: Medicare Other | Admitting: *Deleted

## 2017-04-27 ENCOUNTER — Encounter: Payer: Self-pay | Admitting: Internal Medicine

## 2017-04-27 DIAGNOSIS — J309 Allergic rhinitis, unspecified: Secondary | ICD-10-CM

## 2017-04-27 DIAGNOSIS — M8589 Other specified disorders of bone density and structure, multiple sites: Secondary | ICD-10-CM | POA: Diagnosis not present

## 2017-04-27 DIAGNOSIS — Z1231 Encounter for screening mammogram for malignant neoplasm of breast: Secondary | ICD-10-CM | POA: Diagnosis not present

## 2017-04-28 ENCOUNTER — Ambulatory Visit
Admission: RE | Admit: 2017-04-28 | Discharge: 2017-04-28 | Disposition: A | Discharge status: Home / Self Care | Payer: Medicare Other | Source: Ambulatory Visit | Attending: Internal Medicine | Admitting: Internal Medicine

## 2017-04-28 DIAGNOSIS — J329 Chronic sinusitis, unspecified: Secondary | ICD-10-CM | POA: Diagnosis not present

## 2017-04-28 MED ORDER — IOPAMIDOL (ISOVUE-300) INJECTION 61%
75.0000 mL | Freq: Once | INTRAVENOUS | Status: AC | PRN
  Administered 2017-04-28: 75 mL via INTRAVENOUS

## 2017-05-02 ENCOUNTER — Encounter: Payer: Self-pay | Admitting: Internal Medicine

## 2017-05-02 ENCOUNTER — Ambulatory Visit (INDEPENDENT_AMBULATORY_CARE_PROVIDER_SITE_OTHER): Payer: Medicare Other | Admitting: Internal Medicine

## 2017-05-02 VITALS — BP 130/84 | HR 102 | Temp 99.9°F | Ht 66.0 in | Wt 183.0 lb

## 2017-05-02 DIAGNOSIS — J329 Chronic sinusitis, unspecified: Secondary | ICD-10-CM | POA: Diagnosis not present

## 2017-05-02 DIAGNOSIS — J069 Acute upper respiratory infection, unspecified: Secondary | ICD-10-CM | POA: Diagnosis not present

## 2017-05-02 DIAGNOSIS — J82 Pulmonary eosinophilia, not elsewhere classified: Secondary | ICD-10-CM | POA: Diagnosis not present

## 2017-05-02 DIAGNOSIS — J8283 Eosinophilic asthma: Secondary | ICD-10-CM

## 2017-05-02 DIAGNOSIS — J0100 Acute maxillary sinusitis, unspecified: Secondary | ICD-10-CM | POA: Diagnosis not present

## 2017-05-02 MED ORDER — PREDNISONE 10 MG PO TABS: ORAL_TABLET | ORAL | 0 refills | Status: DC

## 2017-05-02 MED ORDER — CEFTRIAXONE SODIUM 1 G IJ SOLR
1.0000 g | Freq: Once | INTRAMUSCULAR | Status: AC
Start: 2017-05-02 — End: 2017-05-02
  Administered 2017-05-02: 1 g via INTRAMUSCULAR

## 2017-05-02 MED ORDER — LEVOFLOXACIN 500 MG PO TABS: 500.0000 mg | ORAL_TABLET | Freq: Every day | ORAL | 0 refills | Status: DC

## 2017-05-02 NOTE — Progress Notes (Signed)
   Subjective:    Patient ID: Vanessa Harmon, female    DOB: 1950-12-14, 67 y.o.   MRN: [id number]  HPI Seen here January 3 for health maintenance exam. Was c/o sinus issues. Ordered CT of sinuses which was negative for chronic sinusitis.Had allergy vaccine January 10th. Hx of eosinophilic asthma seen by allergist.Is on Fasenra injections for this next due February. Uses Symbicort and ProAir. Spirometry was normal at allergist office in December.  It   Has come down with acute respiratory infection.  Says she feels terrible.  Complaining of maxillary sinus pressure.  Complaining of discolored nasal drainage and sputum production.     Review of Systems see above     Objective:   Physical Exam Temp is 99.9 and pulse is 102.  Pharynx is slightly injected.  TMs slightly full bilaterally.  Sounds nasally congested.  Is coughing in the office.  Neck is supple without adenopathy.  Chest clear to auscultation.       Assessment & Plan:  Acute URI  Acute maxillary sinusitis  History of  eosinophilic asthma  Plan: Rocephin 1 g IM.  Take prednisone and tapering course going from 60 mg to 0 mg over 7 days.  Levaquin 500 mg daily for 10 days.

## 2017-05-03 NOTE — Patient Instructions (Addendum)
Rocephin 1 g IM.  Levaquin 500 mg daily for 10 days.  Rest and drink plenty of fluids.  Take prednisone and tapering course as directed.

## 2017-05-04 DIAGNOSIS — R7302 Impaired glucose tolerance (oral): Secondary | ICD-10-CM | POA: Diagnosis not present

## 2017-05-04 DIAGNOSIS — M150 Primary generalized (osteo)arthritis: Secondary | ICD-10-CM | POA: Diagnosis not present

## 2017-05-04 DIAGNOSIS — I10 Essential (primary) hypertension: Secondary | ICD-10-CM | POA: Diagnosis not present

## 2017-05-04 DIAGNOSIS — K219 Gastro-esophageal reflux disease without esophagitis: Secondary | ICD-10-CM | POA: Diagnosis not present

## 2017-05-04 DIAGNOSIS — J4540 Moderate persistent asthma, uncomplicated: Secondary | ICD-10-CM | POA: Insufficient documentation

## 2017-05-04 DIAGNOSIS — E782 Mixed hyperlipidemia: Secondary | ICD-10-CM | POA: Diagnosis not present

## 2017-05-08 ENCOUNTER — Telehealth: Payer: Self-pay | Admitting: *Deleted

## 2017-05-08 DIAGNOSIS — H6500 Acute serous otitis media, unspecified ear: Secondary | ICD-10-CM | POA: Diagnosis not present

## 2017-05-08 NOTE — Telephone Encounter (Signed)
Per Vanessa Harmon on 05/05/17 patient had advised her that she wanted to D/C her [name]. I left message for patient to call me to confirm same

## 2017-05-09 NOTE — Telephone Encounter (Signed)
Ok thanks for letting me know.   We will see how she does off of it.

## 2017-05-09 NOTE — Telephone Encounter (Signed)
Received call back from patient and she advised that she did not feel that [name] is helping her so she does want to discontinue therapy at this time

## 2017-05-10 ENCOUNTER — Other Ambulatory Visit: Payer: Self-pay | Admitting: Allergy

## 2017-05-10 NOTE — Telephone Encounter (Signed)
Courtesy refill  

## 2017-05-11 DIAGNOSIS — H16103 Unspecified superficial keratitis, bilateral: Secondary | ICD-10-CM | POA: Diagnosis not present

## 2017-05-11 DIAGNOSIS — H1851 Endothelial corneal dystrophy: Secondary | ICD-10-CM | POA: Diagnosis not present

## 2017-05-12 ENCOUNTER — Other Ambulatory Visit: Payer: Self-pay

## 2017-05-12 MED ORDER — FLUTICASONE PROPIONATE 50 MCG/ACT NA SUSP: 1.0000 | Freq: Two times a day (BID) | NASAL | 5 refills | Status: DC

## 2017-05-12 MED ORDER — AZELASTINE HCL 137 MCG/SPRAY NA SOLN: 2.0000 | Freq: Two times a day (BID) | NASAL | 5 refills | Status: DC

## 2017-05-12 NOTE — Telephone Encounter (Signed)
Pt called requesting a refill for her Flonase and Azelastine. Refills sent to requested pharmacy. Pt is aware.

## 2017-05-16 DIAGNOSIS — H903 Sensorineural hearing loss, bilateral: Secondary | ICD-10-CM | POA: Diagnosis not present

## 2017-05-16 DIAGNOSIS — J310 Chronic rhinitis: Secondary | ICD-10-CM | POA: Diagnosis not present

## 2017-05-16 DIAGNOSIS — H918X2 Other specified hearing loss, left ear: Secondary | ICD-10-CM | POA: Diagnosis not present

## 2017-05-17 NOTE — Patient Instructions (Addendum)
Arrange for psychiatric consultation.  Try Elavil at night for sleep.  May benefit from physical therapy.  Try to work on diet and exercise now that stress fracture has healed.  Triglycerides are elevated.  Hemoglobin A1c has increased a bit.  Needs bone density study

## 2017-05-26 DIAGNOSIS — J4540 Moderate persistent asthma, uncomplicated: Secondary | ICD-10-CM | POA: Diagnosis not present

## 2017-05-26 DIAGNOSIS — R0602 Shortness of breath: Secondary | ICD-10-CM | POA: Diagnosis not present

## 2017-05-26 DIAGNOSIS — J411 Mucopurulent chronic bronchitis: Secondary | ICD-10-CM | POA: Diagnosis not present

## 2017-05-26 DIAGNOSIS — Z23 Encounter for immunization: Secondary | ICD-10-CM | POA: Diagnosis not present

## 2017-05-26 DIAGNOSIS — M05741 Rheumatoid arthritis with rheumatoid factor of right hand without organ or systems involvement: Secondary | ICD-10-CM | POA: Diagnosis not present

## 2017-05-30 ENCOUNTER — Other Ambulatory Visit: Payer: Self-pay | Admitting: Pediatrics

## 2017-05-30 ENCOUNTER — Ambulatory Visit
Admission: RE | Admit: 2017-05-30 | Discharge: 2017-05-30 | Disposition: A | Discharge status: Home / Self Care | Payer: Medicare Other | Source: Ambulatory Visit | Attending: Pediatrics | Admitting: Pediatrics

## 2017-05-30 ENCOUNTER — Ambulatory Visit: Payer: Medicare Other

## 2017-05-30 DIAGNOSIS — R0602 Shortness of breath: Secondary | ICD-10-CM

## 2017-05-30 DIAGNOSIS — J411 Mucopurulent chronic bronchitis: Secondary | ICD-10-CM

## 2017-05-30 DIAGNOSIS — L659 Nonscarring hair loss, unspecified: Secondary | ICD-10-CM | POA: Diagnosis not present

## 2017-05-30 DIAGNOSIS — M150 Primary generalized (osteo)arthritis: Secondary | ICD-10-CM | POA: Diagnosis not present

## 2017-05-30 DIAGNOSIS — M1990 Unspecified osteoarthritis, unspecified site: Secondary | ICD-10-CM | POA: Diagnosis not present

## 2017-05-30 DIAGNOSIS — M5136 Other intervertebral disc degeneration, lumbar region: Secondary | ICD-10-CM | POA: Diagnosis not present

## 2017-05-30 DIAGNOSIS — J40 Bronchitis, not specified as acute or chronic: Secondary | ICD-10-CM | POA: Diagnosis not present

## 2017-05-30 DIAGNOSIS — R7989 Other specified abnormal findings of blood chemistry: Secondary | ICD-10-CM | POA: Diagnosis not present

## 2017-06-02 DIAGNOSIS — R0600 Dyspnea, unspecified: Secondary | ICD-10-CM | POA: Diagnosis not present

## 2017-06-02 DIAGNOSIS — J302 Other seasonal allergic rhinitis: Secondary | ICD-10-CM | POA: Diagnosis not present

## 2017-06-02 DIAGNOSIS — R42 Dizziness and giddiness: Secondary | ICD-10-CM | POA: Diagnosis not present

## 2017-06-15 ENCOUNTER — Ambulatory Visit: Payer: Medicare Other | Admitting: Allergy

## 2017-06-15 ENCOUNTER — Ambulatory Visit (INDEPENDENT_AMBULATORY_CARE_PROVIDER_SITE_OTHER): Payer: Medicare Other | Admitting: *Deleted

## 2017-06-15 DIAGNOSIS — J309 Allergic rhinitis, unspecified: Secondary | ICD-10-CM | POA: Diagnosis not present

## 2017-06-22 ENCOUNTER — Other Ambulatory Visit: Payer: Self-pay | Admitting: Cardiology

## 2017-06-23 ENCOUNTER — Ambulatory Visit (INDEPENDENT_AMBULATORY_CARE_PROVIDER_SITE_OTHER): Payer: Medicare Other

## 2017-06-23 DIAGNOSIS — J309 Allergic rhinitis, unspecified: Secondary | ICD-10-CM

## 2017-06-27 ENCOUNTER — Other Ambulatory Visit: Payer: Self-pay | Admitting: Allergy

## 2017-06-28 ENCOUNTER — Telehealth: Payer: Self-pay

## 2017-06-28 NOTE — Telephone Encounter (Signed)
Faxed bone density results to her rheumatologist Dr. Amil Amen.

## 2017-06-29 ENCOUNTER — Ambulatory Visit (INDEPENDENT_AMBULATORY_CARE_PROVIDER_SITE_OTHER): Payer: Medicare Other | Admitting: *Deleted

## 2017-06-29 DIAGNOSIS — J309 Allergic rhinitis, unspecified: Secondary | ICD-10-CM

## 2017-07-05 ENCOUNTER — Ambulatory Visit (INDEPENDENT_AMBULATORY_CARE_PROVIDER_SITE_OTHER): Payer: Medicare Other

## 2017-07-05 ENCOUNTER — Other Ambulatory Visit: Payer: Self-pay | Admitting: Allergy

## 2017-07-05 ENCOUNTER — Other Ambulatory Visit: Payer: Self-pay | Admitting: Internal Medicine

## 2017-07-05 DIAGNOSIS — J309 Allergic rhinitis, unspecified: Secondary | ICD-10-CM | POA: Diagnosis not present

## 2017-07-12 ENCOUNTER — Ambulatory Visit (INDEPENDENT_AMBULATORY_CARE_PROVIDER_SITE_OTHER): Payer: Medicare Other | Admitting: *Deleted

## 2017-07-12 DIAGNOSIS — J309 Allergic rhinitis, unspecified: Secondary | ICD-10-CM

## 2017-07-14 ENCOUNTER — Other Ambulatory Visit: Payer: Self-pay

## 2017-07-14 ENCOUNTER — Telehealth: Payer: Self-pay | Admitting: Internal Medicine

## 2017-07-14 MED ORDER — INDOMETHACIN ER 75 MG PO CPCR: 75.0000 mg | ORAL_CAPSULE | Freq: Every day | ORAL | 1 refills | Status: DC

## 2017-07-14 MED ORDER — ATORVASTATIN CALCIUM 80 MG PO TABS: ORAL_TABLET | ORAL | 1 refills | Status: DC

## 2017-07-14 NOTE — Telephone Encounter (Signed)
Indocin fax request refill received this evening has already been addressed in Epic and e-scribed earlier today. Atoravastin refilled this evening by e-scribe.

## 2017-07-18 DIAGNOSIS — R0600 Dyspnea, unspecified: Secondary | ICD-10-CM | POA: Diagnosis not present

## 2017-07-27 DIAGNOSIS — R0600 Dyspnea, unspecified: Secondary | ICD-10-CM | POA: Diagnosis not present

## 2017-07-27 DIAGNOSIS — I10 Essential (primary) hypertension: Secondary | ICD-10-CM | POA: Diagnosis not present

## 2017-07-27 DIAGNOSIS — E119 Type 2 diabetes mellitus without complications: Secondary | ICD-10-CM | POA: Diagnosis not present

## 2017-07-27 DIAGNOSIS — I2510 Atherosclerotic heart disease of native coronary artery without angina pectoris: Secondary | ICD-10-CM | POA: Diagnosis not present

## 2017-07-27 DIAGNOSIS — E785 Hyperlipidemia, unspecified: Secondary | ICD-10-CM | POA: Diagnosis not present

## 2017-08-02 DIAGNOSIS — R0602 Shortness of breath: Secondary | ICD-10-CM | POA: Diagnosis not present

## 2017-08-02 DIAGNOSIS — R0600 Dyspnea, unspecified: Secondary | ICD-10-CM | POA: Diagnosis not present

## 2017-08-03 ENCOUNTER — Ambulatory Visit (INDEPENDENT_AMBULATORY_CARE_PROVIDER_SITE_OTHER): Payer: Medicare Other | Admitting: *Deleted

## 2017-08-03 DIAGNOSIS — M1711 Unilateral primary osteoarthritis, right knee: Secondary | ICD-10-CM | POA: Diagnosis not present

## 2017-08-03 DIAGNOSIS — M25562 Pain in left knee: Secondary | ICD-10-CM | POA: Diagnosis not present

## 2017-08-03 DIAGNOSIS — R002 Palpitations: Secondary | ICD-10-CM | POA: Diagnosis not present

## 2017-08-03 DIAGNOSIS — R0600 Dyspnea, unspecified: Secondary | ICD-10-CM | POA: Diagnosis not present

## 2017-08-03 DIAGNOSIS — J309 Allergic rhinitis, unspecified: Secondary | ICD-10-CM | POA: Diagnosis not present

## 2017-08-03 DIAGNOSIS — M1712 Unilateral primary osteoarthritis, left knee: Secondary | ICD-10-CM | POA: Diagnosis not present

## 2017-08-03 DIAGNOSIS — M25561 Pain in right knee: Secondary | ICD-10-CM | POA: Diagnosis not present

## 2017-08-10 DIAGNOSIS — M1712 Unilateral primary osteoarthritis, left knee: Secondary | ICD-10-CM | POA: Diagnosis not present

## 2017-08-12 ENCOUNTER — Other Ambulatory Visit: Payer: Self-pay | Admitting: Internal Medicine

## 2017-08-14 ENCOUNTER — Other Ambulatory Visit: Payer: Self-pay | Admitting: Internal Medicine

## 2017-08-15 ENCOUNTER — Ambulatory Visit (INDEPENDENT_AMBULATORY_CARE_PROVIDER_SITE_OTHER): Payer: Medicare Other | Admitting: *Deleted

## 2017-08-15 DIAGNOSIS — M1990 Unspecified osteoarthritis, unspecified site: Secondary | ICD-10-CM | POA: Diagnosis not present

## 2017-08-15 DIAGNOSIS — J309 Allergic rhinitis, unspecified: Secondary | ICD-10-CM | POA: Diagnosis not present

## 2017-08-15 DIAGNOSIS — M150 Primary generalized (osteo)arthritis: Secondary | ICD-10-CM | POA: Diagnosis not present

## 2017-08-15 DIAGNOSIS — R7989 Other specified abnormal findings of blood chemistry: Secondary | ICD-10-CM | POA: Diagnosis not present

## 2017-08-15 DIAGNOSIS — M5136 Other intervertebral disc degeneration, lumbar region: Secondary | ICD-10-CM | POA: Diagnosis not present

## 2017-08-19 ENCOUNTER — Other Ambulatory Visit: Payer: Self-pay | Admitting: Internal Medicine

## 2017-08-22 ENCOUNTER — Ambulatory Visit: Payer: Medicare Other | Admitting: Allergy and Immunology

## 2017-08-22 ENCOUNTER — Encounter: Payer: Self-pay | Admitting: Allergy and Immunology

## 2017-08-22 VITALS — BP 140/70 | HR 60 | Resp 16

## 2017-08-22 DIAGNOSIS — Z7952 Long term (current) use of systemic steroids: Secondary | ICD-10-CM | POA: Diagnosis not present

## 2017-08-22 DIAGNOSIS — K219 Gastro-esophageal reflux disease without esophagitis: Secondary | ICD-10-CM | POA: Diagnosis not present

## 2017-08-22 DIAGNOSIS — J3089 Other allergic rhinitis: Secondary | ICD-10-CM

## 2017-08-22 DIAGNOSIS — J4550 Severe persistent asthma, uncomplicated: Secondary | ICD-10-CM | POA: Diagnosis not present

## 2017-08-22 NOTE — Progress Notes (Signed)
Follow-up Note  Referring Provider: Juanell Fairly, MD Primary Provider: Juanell Fairly, MD Date of Office Visit: 08/22/2017  Subjective:   Vanessa Harmon (DOB: 06-Apr-1951) is a 67 y.o. female who returns to the [facility] on 08/22/2017 in re-evaluation of the following:  HPI: Vanessa Harmon returns to this clinic in evaluation of her asthma and allergic rhinitis and reflux.  I have not seen her in this clinic since 11 February 2016.  She is presently using systemic steroids every day for polymyalgia rheumatica since March 2019.  She is presently at 8 mg daily and has had complete resolution of all of her hip girdle pain.  As expected all of her atopic disease has basically melted away as well.  She has no problems with asthma and does not use any controller agents for asthma at this point time and has very little issue with her nose although she does continue to use Flonase and Astelin consistently.  Rarely does she use of short acting bronchodilator.  She continues on immunotherapy and is presently moving to every 2-week administration.  She has not had an adverse effect secondary to this form of therapy.  Reflux is under excellent control as long she continues to use a combination of a proton pump inhibitor and an H2 receptor blocker.  Allergies as of 08/22/2017      Reactions   Breo Ellipta [fluticasone Furoate-vilanterol]    unknown   Singulair [montelukast Sodium] Other (See Comments)   Suicide thoughts   Spiriva Handihaler [tiotropium Bromide Monohydrate]    Tape Rash   PAPER TAPE: Causes severe rash      Medication List      amitriptyline 10 MG tablet Commonly known as:  ELAVIL TAKE 1 TABLET (10 MG TOTAL) BY MOUTH AT BEDTIME.   aspirin 81 MG tablet Take 81 mg by mouth daily.   atorvastatin 80 MG tablet Commonly known as:  LIPITOR TAKE 1/2 BY MOUTH ONCE DAILY   Azelastine HCl 137 MCG/SPRAY Soln Place 2 sprays into both nostrils 2 (two) times daily.     dextromethorphan-guaiFENesin 30-600 MG 12hr tablet Commonly known as:  MUCINEX DM Take 1 tablet by mouth daily as needed for cough.   escitalopram 20 MG tablet Commonly known as:  LEXAPRO TAKE 1 TABLET BY MOUTH DAILY   FIBER CHOICE PO Take 2 capsules by mouth daily.   fish oil-omega-3 fatty acids 1000 MG capsule Take 1 g by mouth 2 (two) times daily.   folic acid 1 MG tablet Commonly known as:  FOLVITE TAKE ONE TABLET (2 MG TOTAL) BY MOUTH  TWICE DAILY   furosemide 20 MG tablet Commonly known as:  LASIX TAKE 1 TABLET (20 MG TOTAL) BY MOUTH DAILY.   indomethacin 75 MG CR capsule Commonly known as:  INDOCIN SR TAKE 1 CAPSULE BY MOUTH ONCE DAILY   irbesartan 300 MG tablet Commonly known as:  AVAPRO Take 1 tablet (300 mg total) by mouth daily.   methotrexate 25 MG/ML injection Inject 25 mg into the skin once a week. 15 mg=0.8 ml  Every Wednesday   multivitamin capsule Take 1 capsule by mouth daily.   omeprazole 40 MG capsule Commonly known as:  PRILOSEC TAKE ONE CAPSULE BEFORE BREAKFAST DAILY   predniSONE 10 MG tablet Commonly known as:  DELTASONE Take in tapering course as directed 6-5-4-3-2-1   PROAIR HFA 108 (90 Base) MCG/ACT inhaler Generic drug:  albuterol INHALE TWO PUFFS INTO THE LUNGS EVERY 4 HOURS AS NEEDED  FOR WHEEZING OR SHORTNESS OF BREATH.   ranitidine 300 MG tablet Commonly known as:  ZANTAC TAKE ONE TABLET BY MOUTH AT BEDTIME DAILY   STOOL SOFTENER PO Take 1 capsule by mouth as needed (CONSTIPATION).   valACYclovir 1000 MG tablet Commonly known as:  VALTREX Take 1,000 mg by mouth daily.   vitamin C 1000 MG tablet Take 1,000 mg by mouth daily.   Vitamin D3 1000 units Caps Take 5,000 Units by mouth daily.       Past Medical History:  Diagnosis Date  . Adenomatous polyp   . Anxiety    takes Ativan daily as needed  . Arthritis    inflammatory arthritis (Dr. Dagoberto Ligas)  . Asthma   . Chronic back pain    scoliosis   .  Constipation    takes miralax every other day  . Depression    takes Lexapro daily  . Essential hypertension, benign    takes Hyzaar and Metoprolol daily  . History of blood transfusion   . History of bronchitis 5 rys ago  . History of colon polyps   . History of shingles   . Hyperlipidemia    takes Atorvastatin daily  . Insomnia    takes Melatonin nightly  . Joint pain   . Pneumonia as a child  . PSVT (paroxysmal supraventricular tachycardia) ([place])   . Rhinitis, allergic    uses Flonase daily  . Urinary urgency     Past Surgical History:  Procedure Laterality Date  . APPENDECTOMY    . BACK SURGERY  2012   at age 30 d/t scoliosis  . BREAST BIOPSY    . cataract surgery    . COLONOSCOPY    . KNEE ARTHROSCOPY Bilateral   . neuroma removed from foot     unsure of which foot  . SHOULDER SURGERY Right   . TONSILLECTOMY    . TOTAL HIP ARTHROPLASTY Right 04/15/2014   Procedure: RIGHT TOTAL HIP ARTHROPLASTY ANTERIOR APPROACH;  Surgeon: Renette Butters, MD;  Location: [facility];  Service: Orthopedics;  Laterality: Right;    Review of systems negative except as noted in HPI / PMHx or noted below:  Review of Systems  Constitutional: Negative.   HENT: Negative.   Eyes: Negative.   Respiratory: Negative.   Cardiovascular: Negative.   Gastrointestinal: Negative.   Genitourinary: Negative.   Musculoskeletal: Negative.   Skin: Negative.   Neurological: Negative.   Endo/Heme/Allergies: Negative.   Psychiatric/Behavioral: Negative.      Objective:   Vitals:   08/22/17 1738  BP: 140/70  Pulse: 60  Resp: 16          Physical Exam  HENT:  Head: Normocephalic.  Right Ear: Tympanic membrane, external ear and ear canal normal.  Left Ear: Tympanic membrane, external ear and ear canal normal.  Nose: Nose normal. No mucosal edema or rhinorrhea.  Mouth/Throat: Uvula is midline, oropharynx is clear and moist and mucous membranes are normal. No oropharyngeal exudate.  Eyes:  Conjunctivae are normal.  Neck: Trachea normal. No tracheal tenderness present. No tracheal deviation present. No thyromegaly present.  Cardiovascular: Normal rate, regular rhythm, S1 normal, S2 normal and normal heart sounds.  No murmur heard. Pulmonary/Chest: Breath sounds normal. No stridor. No respiratory distress. She has no wheezes. She has no rales.  Musculoskeletal: She exhibits no edema.  Lymphadenopathy:       Head (right side): No tonsillar adenopathy present.       Head (left side): No tonsillar  adenopathy present.    She has no cervical adenopathy.  Neurological: She is alert.  Skin: No rash noted. She is not diaphoretic. No erythema. Nails show no clubbing.    Diagnostics:    Spirometry was performed and demonstrated an FEV1 of 2.27 at 94 % of predicted.  The patient had an Asthma Control Test with the following results: ACT Total Score: 25.    Assessment and Plan:   1. Severe persistent asthma without complication   2. Other allergic rhinitis   3. LPRD (laryngopharyngeal reflux disease)   4. Long term systemic steroid user      1. Continue immunotherapy and Epi-Pen  2. Continue the following:   A. nasal fluticasone one spray each nostril twice a day  B. Nasal azelastine one spray each nostril one time per day   3. Treat reflux:   A. consolidate all caffeine and chocolate consumption  B. omeprazole 40 mg one tablet in AM  C. ranitidine 300 mg one tablet in PM  4. If needed:   A. ProAir HFA 2 puffs every 4-6 hours  B. OTC antihistamine  5. Return to clinic in 6 months or earlier if problem  We will keep Marzella on immunotherapy and as well some therapy for upper airway inflammation as noted above and she will continue to treat reflux.  Once she eliminates her systemic steroid use for polymyalgia rheumatica which will probably be sometime over the course of the next 8 months we will see what happens regarding control of her atopic disease.  I will regroup with  her in 6 months or earlier if there is a problem.  Allena Katz, MD Allergy / Immunology [facility]

## 2017-08-22 NOTE — Patient Instructions (Signed)
  1. Continue immunotherapy and Epi-Pen  2. Continue the following:   A. nasal fluticasone one spray each nostril twice a day  B. Nasal azelastine one spray each nostril one time per day   3. Treat reflux:   A. consolidate all caffeine and chocolate consumption  B. omeprazole 40 mg one tablet in AM  C. ranitidine 300 mg one tablet in PM  4. If needed:   A. ProAir HFA 2 puffs every 4-6 hours  B. OTC antihistamine  5. Return to clinic in 6 months or earlier if problem

## 2017-08-23 ENCOUNTER — Encounter: Payer: Self-pay | Admitting: Allergy and Immunology

## 2017-08-24 DIAGNOSIS — J3081 Allergic rhinitis due to animal (cat) (dog) hair and dander: Secondary | ICD-10-CM | POA: Diagnosis not present

## 2017-08-24 NOTE — Progress Notes (Signed)
VIALS EXP 08-26-18 

## 2017-08-25 DIAGNOSIS — J3089 Other allergic rhinitis: Secondary | ICD-10-CM | POA: Diagnosis not present

## 2017-08-31 ENCOUNTER — Ambulatory Visit (INDEPENDENT_AMBULATORY_CARE_PROVIDER_SITE_OTHER): Payer: Medicare Other

## 2017-08-31 DIAGNOSIS — J3089 Other allergic rhinitis: Secondary | ICD-10-CM

## 2017-09-04 DIAGNOSIS — Z8679 Personal history of other diseases of the circulatory system: Secondary | ICD-10-CM | POA: Diagnosis not present

## 2017-09-04 DIAGNOSIS — I10 Essential (primary) hypertension: Secondary | ICD-10-CM | POA: Diagnosis not present

## 2017-09-05 ENCOUNTER — Other Ambulatory Visit: Payer: Self-pay | Admitting: Allergy

## 2017-09-08 DIAGNOSIS — M1711 Unilateral primary osteoarthritis, right knee: Secondary | ICD-10-CM | POA: Diagnosis not present

## 2017-09-08 DIAGNOSIS — M1712 Unilateral primary osteoarthritis, left knee: Secondary | ICD-10-CM | POA: Diagnosis not present

## 2017-09-08 DIAGNOSIS — M25562 Pain in left knee: Secondary | ICD-10-CM | POA: Diagnosis not present

## 2017-09-08 DIAGNOSIS — M25561 Pain in right knee: Secondary | ICD-10-CM | POA: Diagnosis not present

## 2017-09-14 ENCOUNTER — Ambulatory Visit (INDEPENDENT_AMBULATORY_CARE_PROVIDER_SITE_OTHER): Payer: Medicare Other | Admitting: *Deleted

## 2017-09-14 DIAGNOSIS — J3089 Other allergic rhinitis: Secondary | ICD-10-CM

## 2017-09-20 DIAGNOSIS — I10 Essential (primary) hypertension: Secondary | ICD-10-CM | POA: Diagnosis not present

## 2017-09-20 DIAGNOSIS — M4310 Spondylolisthesis, site unspecified: Secondary | ICD-10-CM | POA: Diagnosis not present

## 2017-09-20 DIAGNOSIS — M4126 Other idiopathic scoliosis, lumbar region: Secondary | ICD-10-CM | POA: Diagnosis not present

## 2017-09-20 DIAGNOSIS — M545 Low back pain: Secondary | ICD-10-CM | POA: Diagnosis not present

## 2017-09-20 DIAGNOSIS — M5416 Radiculopathy, lumbar region: Secondary | ICD-10-CM | POA: Diagnosis not present

## 2017-09-26 ENCOUNTER — Encounter: Payer: Self-pay | Admitting: Allergy and Immunology

## 2017-09-26 ENCOUNTER — Ambulatory Visit: Payer: Medicare Other | Admitting: Allergy and Immunology

## 2017-09-26 VITALS — BP 136/70 | HR 62 | Resp 12

## 2017-09-26 DIAGNOSIS — J4540 Moderate persistent asthma, uncomplicated: Secondary | ICD-10-CM | POA: Diagnosis not present

## 2017-09-26 DIAGNOSIS — K219 Gastro-esophageal reflux disease without esophagitis: Secondary | ICD-10-CM | POA: Diagnosis not present

## 2017-09-26 DIAGNOSIS — J3089 Other allergic rhinitis: Secondary | ICD-10-CM

## 2017-09-26 DIAGNOSIS — Z7952 Long term (current) use of systemic steroids: Secondary | ICD-10-CM

## 2017-09-26 DIAGNOSIS — M5400 Panniculitis affecting regions of neck and back, site unspecified: Secondary | ICD-10-CM | POA: Diagnosis not present

## 2017-09-26 MED ORDER — BUDESONIDE-FORMOTEROL FUMARATE 160-4.5 MCG/ACT IN AERO: 2.0000 | INHALATION_SPRAY | Freq: Two times a day (BID) | RESPIRATORY_TRACT | 3 refills | Status: DC

## 2017-09-26 NOTE — Progress Notes (Signed)
Follow-up Note  Referring Provider: Elby Showers, MD Primary Provider: Elby Showers, MD Date of Office Visit: 09/26/2017  Subjective:   Vanessa Harmon (DOB: 05/05/1950) is a 67 y.o. female who returns to the [facility] on 09/26/2017 in re-evaluation of the following:  HPI: Vanessa Harmon presents to this clinic in reevaluation of her asthma and allergic rhinitis and reflux.  Her last visit to this clinic was 22 Aug 2017 at which point in time she was doing relatively well while using immunotherapy and systemic steroid use for her polymyalgia rheumatica.  Currently she is on 7 mg of prednisone.  She has noticed over the course of the past 2 weeks or so that she has developed nasal congestion and coughing and wheezing and chest tightness and shortness of breath requiring her to use a bronchodilator about 3 times a day for the past week or so.  This is been a slowly progressive issue.  She has not had any anosmia or ugly nasal discharge or fever or chest pain or sputum production.  She believes that her reflux is under very good control at this point in time.  Vanessa Harmon had the administration of verapamil on 04 Sep 2017 and it was shortly after this drug administration that she developed her respiratory symptoms.  Allergies as of 09/26/2017      Reactions   Breo Ellipta [fluticasone Furoate-vilanterol]    unknown   Singulair [montelukast Sodium] Other (See Comments)   Suicide thoughts   Spiriva Handihaler [tiotropium Bromide Monohydrate]    Tape Rash   PAPER TAPE: Causes severe rash      Medication List      amitriptyline 10 MG tablet Commonly known as:  ELAVIL TAKE 1 TABLET (10 MG TOTAL) BY MOUTH AT BEDTIME.   aspirin 81 MG tablet Take 81 mg by mouth daily.   atorvastatin 80 MG tablet Commonly known as:  LIPITOR TAKE 1/2 BY MOUTH ONCE DAILY   Azelastine HCl 137 MCG/SPRAY Soln Place 2 sprays into both nostrils 2 (two) times daily.   budesonide-formoterol 160-4.5  MCG/ACT inhaler Commonly known as:  SYMBICORT Inhale 2 puffs into the lungs 2 (two) times daily.   dextromethorphan-guaiFENesin 30-600 MG 12hr tablet Commonly known as:  MUCINEX DM Take 1 tablet by mouth daily as needed for cough.   escitalopram 20 MG tablet Commonly known as:  LEXAPRO TAKE 1 TABLET BY MOUTH DAILY   FIBER CHOICE PO Take 2 capsules by mouth daily.   fish oil-omega-3 fatty acids 1000 MG capsule Take 1 g by mouth 2 (two) times daily.   folic acid 1 MG tablet Commonly known as:  FOLVITE TAKE ONE TABLET (2 MG TOTAL) BY MOUTH  TWICE DAILY   furosemide 20 MG tablet Commonly known as:  LASIX TAKE 1 TABLET (20 MG TOTAL) BY MOUTH DAILY.   indomethacin 75 MG CR capsule Commonly known as:  INDOCIN SR TAKE 1 CAPSULE BY MOUTH ONCE DAILY   methotrexate 25 MG/ML injection Inject 25 mg into the skin once a week. 15 mg=0.8 ml  Every Wednesday   multivitamin capsule Take 1 capsule by mouth daily.   omeprazole 40 MG capsule Commonly known as:  PRILOSEC TAKE ONE CAPSULE BY MOUTH BEFORE BREAKFAST DAILY   predniSONE 10 MG tablet Commonly known as:  DELTASONE Take in tapering course as directed 6-5-4-3-2-1   PROAIR HFA 108 (90 Base) MCG/ACT inhaler Generic drug:  albuterol INHALE TWO PUFFS INTO THE LUNGS EVERY 4 HOURS AS  NEEDED FOR WHEEZING OR SHORTNESS OF BREATH.   ranitidine 300 MG tablet Commonly known as:  ZANTAC TAKE ONE TABLET BY MOUTH AT BEDTIME DAILY   STOOL SOFTENER PO Take 1 capsule by mouth as needed (CONSTIPATION).   valACYclovir 1000 MG tablet Commonly known as:  VALTREX Take 1,000 mg by mouth daily.   verapamil 120 MG CR tablet Commonly known as:  CALAN-SR Take by mouth.   vitamin C 1000 MG tablet Take 1,000 mg by mouth daily.   Vitamin D3 1000 units Caps Take 5,000 Units by mouth daily.       Past Medical History:  Diagnosis Date  . Adenomatous polyp   . Anxiety    takes Ativan daily as needed  . Arthritis    inflammatory arthritis  (Dr. Dagoberto Ligas)  . Asthma   . Chronic back pain    scoliosis   . Constipation    takes miralax every other day  . Depression    takes Lexapro daily  . Essential hypertension, benign    takes Hyzaar and Metoprolol daily  . History of blood transfusion   . History of bronchitis 5 rys ago  . History of colon polyps   . History of shingles   . Hyperlipidemia    takes Atorvastatin daily  . Insomnia    takes Melatonin nightly  . Joint pain   . Pneumonia as a child  . PSVT (paroxysmal supraventricular tachycardia) ([place])   . Rhinitis, allergic    uses Flonase daily  . Urinary urgency     Past Surgical History:  Procedure Laterality Date  . APPENDECTOMY    . BACK SURGERY  2012   at age 23 d/t scoliosis  . BREAST BIOPSY    . cataract surgery    . COLONOSCOPY    . KNEE ARTHROSCOPY Bilateral   . neuroma removed from foot     unsure of which foot  . SHOULDER SURGERY Right   . TONSILLECTOMY    . TOTAL HIP ARTHROPLASTY Right 04/15/2014   Procedure: RIGHT TOTAL HIP ARTHROPLASTY ANTERIOR APPROACH;  Surgeon: Renette Butters, MD;  Location: [facility];  Service: Orthopedics;  Laterality: Right;    Review of systems negative except as noted in HPI / PMHx or noted below:  Review of Systems  Constitutional: Negative.   HENT: Negative.   Eyes: Negative.   Respiratory: Negative.   Cardiovascular: Negative.   Gastrointestinal: Negative.   Genitourinary: Negative.   Musculoskeletal: Negative.   Skin: Negative.   Neurological: Negative.   Endo/Heme/Allergies: Negative.   Psychiatric/Behavioral: Negative.      Objective:   Vitals:   09/26/17 1719  BP: 136/70  Pulse: 62  Resp: 12  SpO2: 94%          Physical Exam  HENT:  Head: Normocephalic.  Right Ear: Tympanic membrane, external ear and ear canal normal.  Left Ear: Tympanic membrane, external ear and ear canal normal.  Nose: Nose normal. No mucosal edema or rhinorrhea.  Mouth/Throat: Uvula is midline,  oropharynx is clear and moist and mucous membranes are normal. No oropharyngeal exudate.  Eyes: Conjunctivae are normal.  Neck: Trachea normal. No tracheal tenderness present. No tracheal deviation present. No thyromegaly present.  Cardiovascular: Normal rate, regular rhythm, S1 normal, S2 normal and normal heart sounds.  No murmur heard. Pulmonary/Chest: Breath sounds normal. No stridor. No respiratory distress. She has no wheezes. She has no rales.  Musculoskeletal: She exhibits no edema.  Lymphadenopathy:  Head (right side): No tonsillar adenopathy present.       Head (left side): No tonsillar adenopathy present.    She has no cervical adenopathy.  Neurological: She is alert.  Skin: No rash noted. She is not diaphoretic. No erythema. Nails show no clubbing.    Diagnostics:    Spirometry was performed and demonstrated an FEV1 of 2.54 at 105 % of predicted.  The patient had an Asthma Control Test with the following results: ACT Total Score: 11.    Assessment and Plan:   1. Not well controlled moderate persistent asthma   2. Other allergic rhinitis   3. LPRD (laryngopharyngeal reflux disease)   4. Long term systemic steroid user       1. Continue immunotherapy and Epi-Pen  2. Treat inflammation:   A. nasal fluticasone one spray each nostril twice a day  B. Nasal azelastine one spray each nostril one time per day  C. Symbicort 160 - 2 inhalations two times per day with spacer   3. Treat reflux:   A. consolidate all caffeine and chocolate consumption  B. omeprazole 40 mg one tablet in AM  C. ranitidine 300 mg one tablet in PM  4. If needed:   A. ProAir HFA 2 puffs every 4-6 hours  B. OTC antihistamine  C. Nasal saline  5. Verapamil?  6. Return to clinic in 2 weeks or earlier if problem  Vanessa Harmon appears to have some inflammation of her airway and we will treat her with the combination of therapy noted above.  The cause of this inflammation is not entirely clear.   She may have just contracted a viral respiratory tract infection that hopefully will abate over the course of the next week or so.  However, she did start verapamil just a few days prior to the onset of her symptoms and it may be the case that she has a very unusual reaction to verapamil.  We will see how things go over the course the next 1 to 2 weeks with this plan.  Allena Katz, MD Allergy / Immunology [place]

## 2017-09-26 NOTE — Patient Instructions (Addendum)
  1. Continue immunotherapy and Epi-Pen  2. Treat inflammation:   A. nasal fluticasone one spray each nostril twice a day  B. Nasal azelastine one spray each nostril one time per day  C. Symbicort 160 - 2 inhalations two times per day with spacer   3. Treat reflux:   A. consolidate all caffeine and chocolate consumption  B. omeprazole 40 mg one tablet in AM  C. ranitidine 300 mg one tablet in PM  4. If needed:   A. ProAir HFA 2 puffs every 4-6 hours  B. OTC antihistamine  C. Nasal saline  5. Verapamil?  6. Return to clinic in 2 weeks or earlier if problem

## 2017-09-27 ENCOUNTER — Other Ambulatory Visit: Payer: Self-pay

## 2017-09-27 ENCOUNTER — Encounter: Payer: Self-pay | Admitting: Allergy and Immunology

## 2017-09-27 DIAGNOSIS — R7302 Impaired glucose tolerance (oral): Secondary | ICD-10-CM

## 2017-09-27 DIAGNOSIS — Z5181 Encounter for therapeutic drug level monitoring: Secondary | ICD-10-CM

## 2017-09-27 DIAGNOSIS — I10 Essential (primary) hypertension: Secondary | ICD-10-CM

## 2017-09-27 DIAGNOSIS — Z79899 Other long term (current) drug therapy: Secondary | ICD-10-CM

## 2017-09-28 DIAGNOSIS — M5126 Other intervertebral disc displacement, lumbar region: Secondary | ICD-10-CM | POA: Diagnosis not present

## 2017-09-28 DIAGNOSIS — M4310 Spondylolisthesis, site unspecified: Secondary | ICD-10-CM | POA: Diagnosis not present

## 2017-09-28 DIAGNOSIS — M47816 Spondylosis without myelopathy or radiculopathy, lumbar region: Secondary | ICD-10-CM | POA: Diagnosis not present

## 2017-10-02 ENCOUNTER — Other Ambulatory Visit: Payer: Self-pay | Admitting: Internal Medicine

## 2017-10-03 ENCOUNTER — Ambulatory Visit (INDEPENDENT_AMBULATORY_CARE_PROVIDER_SITE_OTHER): Payer: Medicare Other | Admitting: *Deleted

## 2017-10-03 ENCOUNTER — Telehealth: Payer: Self-pay | Admitting: *Deleted

## 2017-10-03 DIAGNOSIS — J3089 Other allergic rhinitis: Secondary | ICD-10-CM | POA: Diagnosis not present

## 2017-10-03 NOTE — Telephone Encounter (Signed)
Patient stopped all inhalers. She states that she started having dizziness, muscle weakness with the Symbicort. She said it also happened with the Spiriva. [name] did not help her. She wants to know if she can try Singulair again for her breathing. Please advise.

## 2017-10-03 NOTE — Telephone Encounter (Signed)
Please inform patient that she can start singulair 10mg  daily to see if this helps. But we need to be aware that her symptoms all started after starting verapamil and she may need to consider a replacement if she continues to have problems.

## 2017-10-04 ENCOUNTER — Other Ambulatory Visit: Payer: Self-pay | Admitting: *Deleted

## 2017-10-04 MED ORDER — MONTELUKAST SODIUM 10 MG PO TABS: 10.0000 mg | ORAL_TABLET | Freq: Every day | ORAL | 5 refills | Status: DC

## 2017-10-04 NOTE — Telephone Encounter (Signed)
Prescription has been sent in. I have called and spoke with the patient and explained everything, she will give [place] a call if symptoms do not improve.

## 2017-10-05 DIAGNOSIS — H40013 Open angle with borderline findings, low risk, bilateral: Secondary | ICD-10-CM | POA: Diagnosis not present

## 2017-10-05 DIAGNOSIS — H1851 Endothelial corneal dystrophy: Secondary | ICD-10-CM | POA: Diagnosis not present

## 2017-10-05 DIAGNOSIS — H04123 Dry eye syndrome of bilateral lacrimal glands: Secondary | ICD-10-CM | POA: Diagnosis not present

## 2017-10-05 DIAGNOSIS — Z961 Presence of intraocular lens: Secondary | ICD-10-CM | POA: Diagnosis not present

## 2017-10-05 DIAGNOSIS — H16213 Exposure keratoconjunctivitis, bilateral: Secondary | ICD-10-CM | POA: Diagnosis not present

## 2017-10-17 DIAGNOSIS — Z8679 Personal history of other diseases of the circulatory system: Secondary | ICD-10-CM | POA: Diagnosis not present

## 2017-10-17 DIAGNOSIS — R7302 Impaired glucose tolerance (oral): Secondary | ICD-10-CM | POA: Diagnosis not present

## 2017-10-17 DIAGNOSIS — E782 Mixed hyperlipidemia: Secondary | ICD-10-CM | POA: Diagnosis not present

## 2017-10-17 DIAGNOSIS — Z0000 Encounter for general adult medical examination without abnormal findings: Secondary | ICD-10-CM | POA: Diagnosis not present

## 2017-10-17 DIAGNOSIS — I10 Essential (primary) hypertension: Secondary | ICD-10-CM | POA: Diagnosis not present

## 2017-10-17 DIAGNOSIS — Z7189 Other specified counseling: Secondary | ICD-10-CM | POA: Diagnosis not present

## 2017-10-18 ENCOUNTER — Encounter: Payer: Self-pay | Admitting: Allergy and Immunology

## 2017-10-18 ENCOUNTER — Ambulatory Visit: Payer: Self-pay

## 2017-10-18 ENCOUNTER — Ambulatory Visit: Payer: Medicare Other | Admitting: Allergy and Immunology

## 2017-10-18 VITALS — BP 128/86 | HR 55 | Resp 16

## 2017-10-18 DIAGNOSIS — J3089 Other allergic rhinitis: Secondary | ICD-10-CM

## 2017-10-18 DIAGNOSIS — Z7952 Long term (current) use of systemic steroids: Secondary | ICD-10-CM | POA: Diagnosis not present

## 2017-10-18 DIAGNOSIS — K219 Gastro-esophageal reflux disease without esophagitis: Secondary | ICD-10-CM | POA: Diagnosis not present

## 2017-10-18 DIAGNOSIS — R7302 Impaired glucose tolerance (oral): Secondary | ICD-10-CM | POA: Diagnosis not present

## 2017-10-18 DIAGNOSIS — M4126 Other idiopathic scoliosis, lumbar region: Secondary | ICD-10-CM | POA: Diagnosis not present

## 2017-10-18 DIAGNOSIS — J4540 Moderate persistent asthma, uncomplicated: Secondary | ICD-10-CM

## 2017-10-18 DIAGNOSIS — M4310 Spondylolisthesis, site unspecified: Secondary | ICD-10-CM | POA: Diagnosis not present

## 2017-10-18 DIAGNOSIS — I10 Essential (primary) hypertension: Secondary | ICD-10-CM | POA: Diagnosis not present

## 2017-10-18 DIAGNOSIS — M545 Low back pain: Secondary | ICD-10-CM | POA: Diagnosis not present

## 2017-10-18 DIAGNOSIS — M5416 Radiculopathy, lumbar region: Secondary | ICD-10-CM | POA: Diagnosis not present

## 2017-10-18 DIAGNOSIS — E782 Mixed hyperlipidemia: Secondary | ICD-10-CM | POA: Diagnosis not present

## 2017-10-18 MED ORDER — FLUTICASONE PROPIONATE 50 MCG/ACT NA SUSP: 1.0000 | Freq: Two times a day (BID) | NASAL | 5 refills | Status: DC

## 2017-10-18 MED ORDER — EPINEPHRINE 0.3 MG/0.3ML IJ SOAJ: 0.3000 mg | Freq: Once | INTRAMUSCULAR | 2 refills | Status: AC

## 2017-10-18 MED ORDER — EPINEPHRINE 0.3 MG/0.3ML IJ SOAJ: 0.3000 mg | Freq: Once | INTRAMUSCULAR | 2 refills | Status: DC

## 2017-10-18 NOTE — Progress Notes (Signed)
Follow-up Note  Referring Provider: Elby Showers, MD Primary Provider: Elby Showers, MD Date of Office Visit: 10/18/2017  Subjective:   Vanessa Harmon (DOB: 12-Mar-1951) is a 67 y.o. female who returns to the [facility] on 10/18/2017 in re-evaluation of the following:  HPI: Vanessa Harmon returns to this clinic in reevaluation of her respiratory tract symptoms addressed during her last evaluation of 25 September 2017 at which point in time she was having coughing and wheezing and chest tightness and shortness of breath and nasal congestion requiring her to use a bronchodilator approximately 3 times per day that correlated with the recent administration of verapamil..  She had some problems utilizing Symbicort that was prescribed during her last visit as it gave rise to muscle weakness and dizziness and she tried Singulair which did help her breathing somewhat.  She did discuss with her cardiologist about the possibility that verapamil was causing this problem with her airway and she discontinued her verapamil as of yesterday and she is actually much better today.  She has no need to use a short acting bronchodilator at this point in time.  Her reflux remains under excellent control at this point.  She continues on her systemic steroid use for polymyalgia rheumatica.  Her immunotherapy is going well without any adverse effect.  Allergies as of 10/18/2017      Reactions   Breo Ellipta [fluticasone Furoate-vilanterol]    unknown   Spiriva Handihaler [tiotropium Bromide Monohydrate]    Tape Rash   PAPER TAPE: Causes severe rash      Medication List      amitriptyline 10 MG tablet Commonly known as:  ELAVIL TAKE 1 TABLET (10 MG TOTAL) BY MOUTH AT BEDTIME.   aspirin 81 MG tablet Take 81 mg by mouth daily.   atorvastatin 80 MG tablet Commonly known as:  LIPITOR TAKE 1/2 BY MOUTH ONCE DAILY   Azelastine HCl 137 MCG/SPRAY Soln Place 2 sprays into both nostrils 2 (two)  times daily.   budesonide-formoterol 160-4.5 MCG/ACT inhaler Commonly known as:  SYMBICORT Inhale 2 puffs into the lungs 2 (two) times daily.   dextromethorphan-guaiFENesin 30-600 MG 12hr tablet Commonly known as:  MUCINEX DM Take 1 tablet by mouth daily as needed for cough.   EPINEPHrine 0.3 mg/0.3 mL Soaj injection Commonly known as:  EPIPEN 2-PAK Inject 0.3 mLs (0.3 mg total) into the muscle once for 1 dose.   escitalopram 20 MG tablet Commonly known as:  LEXAPRO TAKE 1 TABLET BY MOUTH DAILY   FIBER CHOICE PO Take 2 capsules by mouth daily.   fish oil-omega-3 fatty acids 1000 MG capsule Take 1 g by mouth 2 (two) times daily.   fluticasone 50 MCG/ACT nasal spray Commonly known as:  FLONASE Place 1 spray into both nostrils 2 (two) times daily.   folic acid 1 MG tablet Commonly known as:  FOLVITE TAKE ONE TABLET (2 MG TOTAL) BY MOUTH  TWICE DAILY   furosemide 20 MG tablet Commonly known as:  LASIX TAKE 1 TABLET (20 MG TOTAL) BY MOUTH DAILY.   indomethacin 75 MG CR capsule Commonly known as:  INDOCIN SR TAKE 1 CAPSULE BY MOUTH ONCE DAILY   irbesartan 150 MG tablet Commonly known as:  AVAPRO Take 150 mg by mouth daily.   methotrexate 25 MG/ML injection Inject 25 mg into the skin once a week. 15 mg=0.8 ml  Every Wednesday   montelukast 10 MG tablet Commonly known as:  SINGULAIR Take 1  tablet (10 mg total) by mouth at bedtime.   multivitamin capsule Take 1 capsule by mouth daily.   omeprazole 40 MG capsule Commonly known as:  PRILOSEC TAKE ONE CAPSULE BY MOUTH BEFORE BREAKFAST DAILY   predniSONE 10 MG tablet Commonly known as:  DELTASONE Take in tapering course as directed 6-5-4-3-2-1   PROAIR HFA 108 (90 Base) MCG/ACT inhaler Generic drug:  albuterol INHALE TWO PUFFS INTO THE LUNGS EVERY 4 HOURS AS NEEDED FOR WHEEZING OR SHORTNESS OF BREATH.   ranitidine 300 MG tablet Commonly known as:  ZANTAC TAKE ONE TABLET BY MOUTH AT BEDTIME DAILY   STOOL  SOFTENER PO Take 1 capsule by mouth as needed (CONSTIPATION).   traZODone 50 MG tablet Commonly known as:  DESYREL Take 50 mg by mouth at bedtime.   valACYclovir 1000 MG tablet Commonly known as:  VALTREX Take 1,000 mg by mouth daily.   verapamil 120 MG CR tablet Commonly known as:  CALAN-SR Take by mouth.   vitamin C 1000 MG tablet Take 1,000 mg by mouth daily.   Vitamin D3 1000 units Caps Take 5,000 Units by mouth daily.       Past Medical History:  Diagnosis Date  . Adenomatous polyp   . Anxiety    takes Ativan daily as needed  . Arthritis    inflammatory arthritis (Dr. Dagoberto Ligas)  . Asthma   . Chronic back pain    scoliosis   . Constipation    takes miralax every other day  . Depression    takes Lexapro daily  . Eczema   . Essential hypertension, benign    takes Hyzaar and Metoprolol daily  . History of blood transfusion   . History of bronchitis 5 rys ago  . History of colon polyps   . History of shingles   . Hyperlipidemia    takes Atorvastatin daily  . Insomnia    takes Melatonin nightly  . Joint pain   . Pneumonia as a child  . PSVT (paroxysmal supraventricular tachycardia) ([place])   . Recurrent upper respiratory infection (URI)   . Rhinitis, allergic    uses Flonase daily  . Urinary urgency   . Urticaria     Past Surgical History:  Procedure Laterality Date  . ADENOIDECTOMY    . APPENDECTOMY    . BACK SURGERY  2012   at age 61 d/t scoliosis  . BREAST BIOPSY    . cataract surgery    . COLONOSCOPY    . KNEE ARTHROSCOPY Bilateral   . neuroma removed from foot     unsure of which foot  . SHOULDER SURGERY Right   . TONSILLECTOMY    . TOTAL HIP ARTHROPLASTY Right 04/15/2014   Procedure: RIGHT TOTAL HIP ARTHROPLASTY ANTERIOR APPROACH;  Surgeon: Renette Butters, MD;  Location: [facility];  Service: Orthopedics;  Laterality: Right;    Review of systems negative except as noted in HPI / PMHx or noted below:  Review of Systems    Constitutional: Negative.   HENT: Negative.   Eyes: Negative.   Respiratory: Negative.   Cardiovascular: Negative.   Gastrointestinal: Negative.   Genitourinary: Negative.   Musculoskeletal: Negative.   Skin: Negative.   Neurological: Negative.   Endo/Heme/Allergies: Negative.   Psychiatric/Behavioral: Negative.      Objective:   Vitals:   10/18/17 1120  BP: 128/86  Pulse: (!) 55  Resp: 16  SpO2: 96%          Physical Exam  HENT:  Head: Normocephalic.  Right Ear: Tympanic membrane, external ear and ear canal normal.  Left Ear: Tympanic membrane, external ear and ear canal normal.  Nose: Nose normal. No mucosal edema or rhinorrhea.  Mouth/Throat: Uvula is midline, oropharynx is clear and moist and mucous membranes are normal. No oropharyngeal exudate.  Eyes: Conjunctivae are normal.  Neck: Trachea normal. No tracheal tenderness present. No tracheal deviation present. No thyromegaly present.  Cardiovascular: Normal rate, regular rhythm, S1 normal, S2 normal and normal heart sounds.  No murmur heard. Pulmonary/Chest: Breath sounds normal. No stridor. No respiratory distress. She has no wheezes. She has no rales.  Musculoskeletal: She exhibits no edema.  Lymphadenopathy:       Head (right side): No tonsillar adenopathy present.       Head (left side): No tonsillar adenopathy present.    She has no cervical adenopathy.  Neurological: She is alert.  Skin: No rash noted. She is not diaphoretic. No erythema. Nails show no clubbing.    Diagnostics:    Spirometry was performed and demonstrated an FEV1 of 2.44 at 96 % of predicted.  Assessment and Plan:   1. Asthma, moderate persistent, well-controlled   2. Other allergic rhinitis   3. LPRD (laryngopharyngeal reflux disease)   4. Long term systemic steroid user       1. Continue immunotherapy and Epi-Pen  2.  Continue to treat inflammation:   A. nasal fluticasone one spray each nostril twice a day  B. Nasal  azelastine one spray each nostril one time per day  C. Singulair 10mg  tablet one time per day   3.  Continue to treat reflux:   A. consolidate all caffeine and chocolate consumption  B. omeprazole 40 mg one tablet in AM  C. ranitidine 300 mg one tablet in PM  4. If needed:   A. ProAir HFA 2 puffs every 4-6 hours  B. OTC antihistamine  C. Nasal saline  6.  Return to clinic in 6 months or earlier if problem  7.  Obtain fall flu vaccine  Zilda appears to be doing quite well on her current therapy which includes a leukotriene modifier and a nasal steroid and immunotherapy and she will continue on this plan while also addressing the issue of her reflux induced respiratory disease and I will see her back in this clinic in 6 months or earlier if there is a problem.  Given the fact that she is doing much better since she discontinued her verapamil it does appear as though that pharmacological agent may have been responsible for some of her respiratory tract symptoms.  I would expect that over the course of the next week or 2 any effect that she has received from that verapamil administration would be completely eliminated.  Allena Katz, MD Allergy / Immunology [place]

## 2017-10-18 NOTE — Patient Instructions (Addendum)
  1. Continue immunotherapy and Epi-Pen  2.  Continue to treat inflammation:   A. nasal fluticasone one spray each nostril twice a day  B. Nasal azelastine one spray each nostril one time per day  C. Singulair 10mg  tablet one time per day   3.  Continue to treat reflux:   A. consolidate all caffeine and chocolate consumption  B. omeprazole 40 mg one tablet in AM  C. ranitidine 300 mg one tablet in PM  4. If needed:   A. ProAir HFA 2 puffs every 4-6 hours  B. OTC antihistamine  C. Nasal saline  6.  Return to clinic in 6 months or earlier if problem  7.  Obtain fall flu vaccine

## 2017-10-23 ENCOUNTER — Other Ambulatory Visit: Payer: Medicare Other | Admitting: Internal Medicine

## 2017-10-23 ENCOUNTER — Encounter: Payer: Self-pay | Admitting: Allergy and Immunology

## 2017-10-24 DIAGNOSIS — M5416 Radiculopathy, lumbar region: Secondary | ICD-10-CM | POA: Diagnosis not present

## 2017-10-26 ENCOUNTER — Ambulatory Visit: Payer: Medicare Other | Admitting: Internal Medicine

## 2017-10-26 ENCOUNTER — Ambulatory Visit (INDEPENDENT_AMBULATORY_CARE_PROVIDER_SITE_OTHER): Payer: Medicare Other | Admitting: *Deleted

## 2017-10-26 DIAGNOSIS — J309 Allergic rhinitis, unspecified: Secondary | ICD-10-CM | POA: Diagnosis not present

## 2017-11-13 ENCOUNTER — Other Ambulatory Visit: Payer: Self-pay | Admitting: Allergy

## 2017-11-15 ENCOUNTER — Ambulatory Visit (INDEPENDENT_AMBULATORY_CARE_PROVIDER_SITE_OTHER): Payer: Medicare Other | Admitting: *Deleted

## 2017-11-15 DIAGNOSIS — J309 Allergic rhinitis, unspecified: Secondary | ICD-10-CM

## 2017-11-16 DIAGNOSIS — M1712 Unilateral primary osteoarthritis, left knee: Secondary | ICD-10-CM | POA: Diagnosis not present

## 2017-11-16 DIAGNOSIS — M25561 Pain in right knee: Secondary | ICD-10-CM | POA: Diagnosis not present

## 2017-11-16 DIAGNOSIS — M7061 Trochanteric bursitis, right hip: Secondary | ICD-10-CM | POA: Diagnosis not present

## 2017-11-16 DIAGNOSIS — M25562 Pain in left knee: Secondary | ICD-10-CM | POA: Diagnosis not present

## 2017-11-21 DIAGNOSIS — M25562 Pain in left knee: Secondary | ICD-10-CM | POA: Diagnosis not present

## 2017-11-21 DIAGNOSIS — M25561 Pain in right knee: Secondary | ICD-10-CM | POA: Diagnosis not present

## 2017-11-21 DIAGNOSIS — M25551 Pain in right hip: Secondary | ICD-10-CM | POA: Diagnosis not present

## 2017-11-21 DIAGNOSIS — I10 Essential (primary) hypertension: Secondary | ICD-10-CM | POA: Diagnosis not present

## 2017-11-22 ENCOUNTER — Ambulatory Visit (INDEPENDENT_AMBULATORY_CARE_PROVIDER_SITE_OTHER): Payer: Medicare Other | Admitting: *Deleted

## 2017-11-22 DIAGNOSIS — L659 Nonscarring hair loss, unspecified: Secondary | ICD-10-CM | POA: Diagnosis not present

## 2017-11-22 DIAGNOSIS — J309 Allergic rhinitis, unspecified: Secondary | ICD-10-CM

## 2017-11-22 DIAGNOSIS — M1990 Unspecified osteoarthritis, unspecified site: Secondary | ICD-10-CM | POA: Diagnosis not present

## 2017-11-22 DIAGNOSIS — M150 Primary generalized (osteo)arthritis: Secondary | ICD-10-CM | POA: Diagnosis not present

## 2017-11-22 DIAGNOSIS — M5136 Other intervertebral disc degeneration, lumbar region: Secondary | ICD-10-CM | POA: Diagnosis not present

## 2017-11-22 DIAGNOSIS — M5416 Radiculopathy, lumbar region: Secondary | ICD-10-CM | POA: Diagnosis not present

## 2017-11-22 DIAGNOSIS — R7989 Other specified abnormal findings of blood chemistry: Secondary | ICD-10-CM | POA: Diagnosis not present

## 2017-11-22 DIAGNOSIS — I10 Essential (primary) hypertension: Secondary | ICD-10-CM | POA: Diagnosis not present

## 2017-11-22 DIAGNOSIS — M545 Low back pain: Secondary | ICD-10-CM | POA: Diagnosis not present

## 2017-11-27 ENCOUNTER — Ambulatory Visit (INDEPENDENT_AMBULATORY_CARE_PROVIDER_SITE_OTHER): Payer: Medicare Other

## 2017-11-27 DIAGNOSIS — M5416 Radiculopathy, lumbar region: Secondary | ICD-10-CM | POA: Diagnosis not present

## 2017-11-27 DIAGNOSIS — J309 Allergic rhinitis, unspecified: Secondary | ICD-10-CM | POA: Diagnosis not present

## 2018-01-05 DIAGNOSIS — M4310 Spondylolisthesis, site unspecified: Secondary | ICD-10-CM | POA: Diagnosis not present

## 2018-01-05 DIAGNOSIS — M5416 Radiculopathy, lumbar region: Secondary | ICD-10-CM | POA: Diagnosis not present

## 2018-01-05 DIAGNOSIS — M4135 Thoracogenic scoliosis, thoracolumbar region: Secondary | ICD-10-CM | POA: Diagnosis not present

## 2018-01-05 DIAGNOSIS — I10 Essential (primary) hypertension: Secondary | ICD-10-CM | POA: Diagnosis not present

## 2018-01-05 DIAGNOSIS — M4805 Spinal stenosis, thoracolumbar region: Secondary | ICD-10-CM | POA: Diagnosis not present

## 2018-01-08 ENCOUNTER — Other Ambulatory Visit: Payer: Self-pay | Admitting: Neurosurgery

## 2018-01-23 NOTE — H&P (Signed)
Patient ID:   [id number] Patient: Vanessa Harmon  Date of Birth: [birth year] Visit Type: Office Visit   Date: 01/05/2018 02:00 PM Provider: Marchia Meiers. Vertell Limber MD   This 67 year old female presents for back pain.  HISTORY OF PRESENT ILLNESS:  1.  back pain  11/27/2017 left L1-2 transforaminal ESI 10/24/2017 right L1-2 transforaminal ESI  Patient returns as scheduled reporting no relief following injections.  Norco was prescribed for her trip by Simeon Craft PA-C, which offered some pain relief.  She continues to take 2 3 tabs per day.  She reports bilateral lumbar pain into the buttocks with right hip and thigh pain intermittently, increasing through the day.  MRI and plain films from June on canopy.  The patient has retrolisthesis of L1 on L2 with severe biforaminal stenosis and disc protrusion at this level.  This is causing significant nerve root compression.  Injections have not given her any significant relief.  She has progression of scoliosis at the thoracolumbar junction and will require decompression and fusion to the level of T9 or T10 through the L2 levels.  We are performing computer modeling of her thoraco-lumbar spine to determine the proper reconstructive surgical procedure.  I do not believe that she will require extension to the pelvis given her prior surgery by Dr. Luiz Ochoa which included decompression and fusion from the L3 through sacral levels and which appears to have healed well.         Medical/Surgical/Interim History Reviewed, no change.  Last detailed document date:07/31/2013.     PAST MEDICAL HISTORY, SURGICAL HISTORY, FAMILY HISTORY, SOCIAL HISTORY AND REVIEW OF SYSTEMS I have reviewed the patient's past medical, surgical, family and social history as well as the comprehensive review of systems as included on the [facility] history form dated 10/26/2017, which I have signed.  Family History:  Reviewed, no changes.  Last  detailed document date:07/31/2013.   Social History: Reviewed, no changes. Last detailed document date: 07/31/2013.    MEDICATIONS: (added, continued or stopped this visit) Started Medication Directions Instruction Stopped   amitriptyline 10 mg tablet take 1 tablet by oral route  every bedtime     aspirin 81 mg tablet,delayed release take 1 tablet by oral route  every day     ASTELIN NASAL SPRAY  NASAL      atorvastatin 80 mg tablet take 1 tablet by oral route  every day     chlorthalidone 25 mg tablet take 1 tablet by oral route  every day     Fish Oil take 1 tablet by mouth twice daily     FLUTICASONE PROPIONATE inhale 2 spray by intranasal route 2 times every day in each nostril     folic acid 1 mg tablet take 1 tablet by oral route 3 times every day    01/05/2018 hydrocodone 5 mg-acetaminophen 325 mg tablet take 1 tablet by oral route  every 8 hours as needed for pain    11/22/2017 hydrocodone 5 mg-acetaminophen 325 mg tablet take 1 tablet by oral route  every 8 hours as needed for pain  01/05/2018   indomethacin ER 75 mg capsule,extended release take 1 capsule by oral route  every day     irbesartan 150 mg tablet take 1 tablet by oral route  every day     Lasix 20 mg tablet take 1 tablet by oral route  every day     Lexapro 20 mg tablet take 1 tablet by oral route  every  day     methotrexate      Mucinex DM      multivitamin tablet 1 tablet daily     omeprazole 40 mg capsule,delayed release take 1 capsule by oral route  every day before a meal     prednisone 5 mg tablet take 1.5 tablet by oral route  every day     ProAir HFA 90 mcg/actuation aerosol inhaler inhale 2 puff by inhalation route  every 4 - 6 hours as needed     ranitidine 300 mg tablet take 1 tablet by oral route  every day at bedtime     Rasuvo (PF) 30 mg/0.6 mL subcutaneous auto-injector inject 0.6 milliliter by subcutaneous route  every week     Singulair 10 mg tablet take 1 tablet by oral route  every day in the  evening     Stool Softener take 1 capsule by oral route  every day at bedtime as needed     trazodone 50 mg tablet take 1 tablet by oral route  every day after meals     valacyclovir 1 gram tablet take 1 tablet by oral route  every day     Vitamin C ER 1,000 mg tablet,extended release take 1 tablet by mouth daily     Vitamin D3      Zyrtec        ALLERGIES: Ingredient Reaction Medication Name Comment  LEVOFLOXACIN  Levaquin   VERAPAMIL     DOXYCYCLINE MONOHYDRATE  Vibramycin Rash  DOXYCYCLINE HYCLATE  Vibramycin Rash  DOXYCYCLINE CALCIUM  Vibramycin Rash      PHYSICAL EXAM:   Vitals Date Temp F BP Pulse Ht In Wt Lb BMI BSA Pain Score  01/05/2018  132/78 60 67 169 26.47  8/10      IMPRESSION:   The patient has gone on to have progression of scoliosis and spinal stenosis with retrolisthesis of L1 on L2.  She is not getting any relief with injections and conservative management.  She says her pain is not improving.  She is having to take narcotic analgesics and regards her pain level in her back at a 7/10 at this point.  She is unable to perform any of the normal activities that she wants to and had to cut her trip [place] short because of significant pain.  PLAN:  Proceed with extension of fusion and decompression L1-2 level to across the thoracolumbar junction.  Risks and benefits were discussed in detail with patient.  She only has and TLSO brace.  She wished to proceed with surgery.  This has been scheduled for October 31st at [facility]  She will be cleared for surgery by her cardiologist in [facility] prior to proceeding with surgery.  Orders: Diagnostic Procedures: Assessment Procedure  M54.16 Scoliosis- AP/Lat  Instruction(s)/Education: Assessment Instruction  I10 Hypertension education  Z68.26 Lifestyle education regarding diet   Completed Orders (this encounter) Order Details Reason Side Interpretation Result Initial Treatment Date Region  Hypertension  education Continue to monitor blood pressure , if blood pressure continues elevated contact primary care        Lifestyle education regarding diet Patient encouraged to eat a well balance diet         Assessment/Plan   # Detail Type Description   1. Assessment Spondylolisthesis, site unspecified (M43.10).       2. Assessment Thoracogenic scoliosis of thoracolumbar region (M41.35).       3. Assessment Spinal stenosis, thoracolumbar region (M48.05).  4. Assessment Lumbar radiculopathy (M54.16).       5. Assessment Essential (primary) hypertension (I10).       6. Assessment Body mass index (BMI) 26.0-26.9, adult (B84.66).   Plan Orders Today's instructions / counseling include(s) Lifestyle education regarding diet. Clinical information/comments: Patient encouraged to eat a well balance diet.         Pain Management Plan Pain Scale: 8/10. Method: Numeric Pain Intensity Scale. Location: back. Onset: 01/30/2013. Duration: varies. Quality: discomforting. Pain management follow-up plan of care: Patient taking medication as prescribed.  Fall Risk Plan The patient has not fallen in the last year.     MEDICATIONS PRESCRIBED TODAY    Rx Quantity Refills  HYDROCODONE-ACETAMINOPHEN 5 mg-325 mg  60 0            Provider:  Marchia Meiers. Vertell Limber MD  01/06/2018 12:56 PM Dictation edited by: Marchia Meiers. Vertell Limber    CC Providers: [facility] [address] [place],  [place]  [address]-   [facility]  [place], [place] [address]-               Electronically signed by Marchia Meiers. Vertell Limber MD on 01/06/2018 12:57 PM

## 2018-01-25 ENCOUNTER — Ambulatory Visit (INDEPENDENT_AMBULATORY_CARE_PROVIDER_SITE_OTHER): Payer: Medicare Other | Admitting: *Deleted

## 2018-01-25 DIAGNOSIS — J309 Allergic rhinitis, unspecified: Secondary | ICD-10-CM

## 2018-01-30 ENCOUNTER — Other Ambulatory Visit: Payer: Self-pay | Admitting: Allergy

## 2018-01-31 ENCOUNTER — Ambulatory Visit (INDEPENDENT_AMBULATORY_CARE_PROVIDER_SITE_OTHER): Payer: Medicare Other | Admitting: *Deleted

## 2018-01-31 DIAGNOSIS — J309 Allergic rhinitis, unspecified: Secondary | ICD-10-CM

## 2018-02-06 NOTE — Pre-Procedure Instructions (Signed)
Vanessa Harmon  02/06/2018      [place], [place] [address] [place] [address] Phone: [phone] Fax: [phone]    Your procedure is scheduled on Oct. 31  Report to [facility] Admitting at 8:00  A.M.  Call this number if you have problems the morning of surgery:  [phone]   Remember:  Do not eat or drink after midnight.      Take these medicines the morning of surgery with A SIP OF WATER :             Azelastine nasal spray            Cetirizine (zyrtec) if needed            escitalopram (lexapro)            flonase nasal spray            Hydrocodone if needed            Omeprazole (prilosec)            Prednisone (deltasone)            proair--bring to hospital            Valacyclovir (valtrex)              7 days prior to surgery STOP taking any Aspirin(unless otherwise instructed by your surgeon), Aleve, Naproxen, Ibuprofen, Motrin, Advil, Goody's, BC's, all herbal medications, fish oil, and all vitamins              Follow your surgeon's instructions on when to stop Asprin.  If no instructions were given by your surgeon then you will need to call the office to get those instructions.       Do not wear jewelry, make-up or nail polish.  Do not wear lotions, powders, or perfumes, or deodorant.  Do not shave 48 hours prior to surgery.  Men may shave face and neck.  Do not bring valuables to the hospital.  [facility] is not responsible for any belongings or valuables.  Contacts, dentures or bridgework may not be worn into surgery.  Leave your suitcase in the car.  After surgery it may be brought to your room.  For patients admitted to the hospital, discharge time will be determined by your treatment team.  Patients discharged the day of surgery will not be allowed to drive home.   Special instructions:  - Preparing For Surgery  Before surgery, you can play an  important role. Because skin is not sterile, your skin needs to be as free of germs as possible. You can reduce the number of germs on your skin by washing with CHG (chlorahexidine gluconate) Soap before surgery.  CHG is an antiseptic cleaner which kills germs and bonds with the skin to continue killing germs even after washing.    Oral Hygiene is also important to reduce your risk of infection.  Remember - BRUSH YOUR TEETH THE MORNING OF SURGERY WITH YOUR REGULAR TOOTHPASTE  Please do not use if you have an allergy to CHG or antibacterial soaps. If your skin becomes reddened/irritated stop using the CHG.  Do not shave (including legs and underarms) for at least 48 hours prior to first CHG shower. It is OK to shave your face.  Please follow these instructions carefully.   1. Shower the NIGHT BEFORE SURGERY and the MORNING OF  SURGERY with CHG.   2. If you chose to wash your hair, wash your hair first as usual with your normal shampoo.  3. After you shampoo, rinse your hair and body thoroughly to remove the shampoo.  4. Use CHG as you would any other liquid soap. You can apply CHG directly to the skin and wash gently with a scrungie or a clean washcloth.   5. Apply the CHG Soap to your body ONLY FROM THE NECK DOWN.  Do not use on open wounds or open sores. Avoid contact with your eyes, ears, mouth and genitals (private parts). Wash Face and genitals (private parts)  with your normal soap.  6. Wash thoroughly, paying special attention to the area where your surgery will be performed.  7. Thoroughly rinse your body with warm water from the neck down.  8. DO NOT shower/wash with your normal soap after using and rinsing off the CHG Soap.  9. Pat yourself dry with a CLEAN TOWEL.  10. Wear CLEAN PAJAMAS to bed the night before surgery, wear comfortable clothes the morning of surgery  11. Place CLEAN SHEETS on your bed the night of your first shower and DO NOT SLEEP WITH PETS.    Day of  Surgery:  Do not apply any deodorants/lotions.  Please wear clean clothes to the hospital/surgery center.   Remember to brush your teeth WITH YOUR REGULAR TOOTHPASTE.    Please read over the following fact sheets that you were given. Coughing and Deep Breathing, MRSA Information and Surgical Site Infection Prevention

## 2018-02-07 ENCOUNTER — Encounter (HOSPITAL_COMMUNITY): Payer: Self-pay

## 2018-02-07 ENCOUNTER — Other Ambulatory Visit: Payer: Self-pay

## 2018-02-07 ENCOUNTER — Ambulatory Visit (INDEPENDENT_AMBULATORY_CARE_PROVIDER_SITE_OTHER): Payer: Medicare Other

## 2018-02-07 ENCOUNTER — Encounter (HOSPITAL_COMMUNITY)
Admission: RE | Admit: 2018-02-07 | Discharge: 2018-02-07 | Disposition: A | Discharge status: Home / Self Care | Payer: Medicare Other | Source: Ambulatory Visit | Attending: Neurosurgery | Admitting: Neurosurgery

## 2018-02-07 DIAGNOSIS — J309 Allergic rhinitis, unspecified: Secondary | ICD-10-CM | POA: Diagnosis not present

## 2018-02-07 DIAGNOSIS — Z01810 Encounter for preprocedural cardiovascular examination: Secondary | ICD-10-CM | POA: Diagnosis not present

## 2018-02-07 DIAGNOSIS — Z01812 Encounter for preprocedural laboratory examination: Secondary | ICD-10-CM | POA: Insufficient documentation

## 2018-02-07 DIAGNOSIS — E119 Type 2 diabetes mellitus without complications: Secondary | ICD-10-CM | POA: Insufficient documentation

## 2018-02-07 HISTORY — DX: Cardiac arrhythmia, unspecified: I49.9

## 2018-02-07 HISTORY — DX: Gastro-esophageal reflux disease without esophagitis: K21.9

## 2018-02-07 LAB — BASIC METABOLIC PANEL
Anion gap: 7 (ref 5–15)
BUN: 25 mg/dL — ABNORMAL HIGH (ref 8–23)
CO2: 27 mmol/L (ref 22–32)
Calcium: 9.7 mg/dL (ref 8.9–10.3)
Chloride: 102 mmol/L (ref 98–111)
Creatinine, Ser: 1.13 mg/dL — ABNORMAL HIGH (ref 0.44–1.00)
GFR calc Af Amer: 57 mL/min — ABNORMAL LOW (ref >60)
GFR calc non Af Amer: 49 mL/min — ABNORMAL LOW (ref >60)
Glucose, Bld: 129 mg/dL — ABNORMAL HIGH (ref 70–99)
Potassium: 3.9 mmol/L (ref 3.5–5.1)
Sodium: 136 mmol/L (ref 135–145)

## 2018-02-07 LAB — TYPE AND SCREEN
ABO/RH(D): O POS
Antibody Screen: NEGATIVE

## 2018-02-07 LAB — SURGICAL PCR SCREEN
MRSA, PCR: NEGATIVE
Staphylococcus aureus: NEGATIVE

## 2018-02-07 LAB — CBC
HCT: 42.1 % (ref 36.0–46.0)
Hemoglobin: 13.4 g/dL (ref 12.0–15.0)
MCH: 32.8 pg (ref 26.0–34.0)
MCHC: 31.8 g/dL (ref 30.0–36.0)
MCV: 102.9 fL — ABNORMAL HIGH (ref 80.0–100.0)
Platelets: 266 10*3/uL (ref 150–400)
RBC: 4.09 MIL/uL (ref 3.87–5.11)
RDW: 13.7 % (ref 11.5–15.5)
WBC: 13.3 10*3/uL — ABNORMAL HIGH (ref 4.0–10.5)
nRBC: 0 % (ref 0.0–0.2)

## 2018-02-07 NOTE — Progress Notes (Signed)
[facility] Cardiologist - Dr. Jacques Earthly- [facility]  Chest x-ray - 03/2017 EKG - 07/2017- records requested Stress Test - 07/2017 ECHO - 07/2017 Cardiac Cath - denies  Sleep Study - denies  Aspirin Instructions: Discontinue 7 days prior to surgery per cardiology. Pt states she has also discontinued her methotrexate for surgery.  Anesthesia review: yes- cardiac history  Patient denies shortness of breath, fever, cough and chest pain at PAT appointment   Patient verbalized understanding of instructions that were given to them at the PAT appointment. Patient was also instructed that they will need to review over the PAT instructions again at home before surgery.

## 2018-02-08 NOTE — Progress Notes (Signed)
Anesthesia Chart Review:  Case:  [id number] Date/Time:  02/15/18 0953   Procedure:  Decompression and fusion Thoracic 9 to Lumbar 2 with exploration of previous fusion (N/A Back) - Decompression and fusion Thoracic 9 to Lumbar 2 with exploration of previous fusion   Anesthesia type:  General   Pre-op diagnosis:  Spinal stenosis, Thoracolumbar region   Location:  [facility] OR ROOM 21 / [place] OR   Surgeon:  Erline Levine, MD      DISCUSSION: Patient is a 67 year old female scheduled for the above procedure.   History includes PSVT/atrial tachycardia, HTN, HLD, scoliosis, GERD, inflammatory arthritis (Dr. Thyra Breed), former smoker. S/p exploration and extension of T10 - L3 fusion with L2-3 PLIF on 11/28/13. - She had two episodes of SVT ~ age 42 that required treatment with adenosine. Was referred to cardiology (Dr. Danny Lawless) in 2019 for evaluation of dyspnea in the setting of significant coronary calcifications on chest CT. She ultimately underwent stress, echo, and Zio patch that showed non-ischemia, normal EF, with event monitor showing short runs of SVT/atrial tachycardia. She was minimally symptomatic of her palpitations and had not tolerated b-blockers or CCBs in the past. She could consider ablation or antiarrhythmic medications in the future should she experience worsening symptoms.   She has cardiac clearance by Dr. Gennette Pac. He categorized her as "low risk" and gave permission to hold ASA for 7 days prior to surgery. EKG requested from his office, but is still pending. If not received then plan EKG on the day of surgery.   WBC 13.3K called to Janett Billow at Dr. Melven Sartorius office. Patient denied SOB, fever, cough, and chest pain at PAT. Defer additional orders, if any, to surgeon. If no acute changes then I anticipate that she can proceed as planned. ASA and methotrexate on hold for surgery. She is on daily prednisone (5 mg).   VS: BP 130/72   Pulse 66   Temp 36.6 C   Resp 20   Ht 5' 6.5"  (1.689 m)   Wt 74.8 kg   SpO2 99%   BMI 26.23 kg/m     PROVIDERS: Juanell Fairly, MD is PCP ([place]). Last visit 11/21/17. Domenica Reamer, MD is cardiologist ([place]). Last visit 10/17/17.  LABS: Preoperative labs noted. WBC 13.3. A1c 5.7 on 10/18/17 ([place]). (all labs ordered are listed, but only abnormal results are displayed)  Labs Reviewed  BASIC METABOLIC PANEL - Abnormal; Notable for the following components:      Result Value   Glucose, Bld 129 (*)    BUN 25 (*)    Creatinine, Ser 1.13 (*)    GFR calc non Af Amer 49 (*)    GFR calc Af Amer 57 (*)    All other components within normal limits  CBC - Abnormal; Notable for the following components:   WBC 13.3 (*)    MCV 102.9 (*)    All other components within normal limits  SURGICAL PCR SCREEN  TYPE AND SCREEN    IMAGES: CT Chest High Resolution 05/30/17: IMPRESSION: 1. No evidence of interstitial lung disease. No pulmonary parenchymal findings to explain the patient's symptoms. 2. Aortic atherosclerosis (ICD10-170.0). Coronary artery calcification.   EKG: 07/23/17 [facility], Dr. Gennette Pac): Tracing requested. According to Result Narrative: SINUS BRADYCARDIA (55 BPM) INCOMPLETE RIGHT BUNDLE BRANCH BLOCK BORDERLINE ECG NO PREVIOUS ECGS AVAILABLE Confirmed by Aliene Beams ([id number]) on 07/23/2017 12:07:37 PM   CV: 7 day Cardiac Monitor 08/13/17 [facility]  Health Care Everywhere): Result Impression: The patient was monitored for 7 days and 21 hours Patient had a min HR of 49 bpm, max HR of 193 bpm, and avg HR of 66 bpm. Predominant underlying rhythm was Sinus Rhythm.  155 Supraventricular Tachycardia runs occurred, the run with the fastest interval lasting 7 beats with a max rate of 193 bpm, the longest lasting 13 beats with an avg rate of 122 bpm.  Some episodes of Supraventricular Tachycardia may be possible Atrial Tachycardia with variable block.  Isolated SVEs were rare  (<1.0%), SVE Couplets were rare (<1.0%), and SVE Triplets were rare (<1.0%).  Isolated VEs were rare (<1.0%), and no VE Couplets or VE Triplets were present.  Nuclear stress test 07/28/17 ([place]): Impressions: - Normal myocardial perfusion study - No evidence for significant ischemia or scar is noted. - Post stress: Global systolic function is hyperdynamic. The ejection  fraction was greater than 65%. Left ventricular chamber size is relatively  small. - Mild coronary calcifications are noted - Noted on attenuation CT is non-specific diffuse ground glass apperance  of the lungs  Echo 07/20/17 ([place]):  Normal left ventricular systolic function, ejection fraction 60 to 16%  Diastolic dysfunction - grade I (normal filling pressures)  Dilated left atrium - mild  Aortic regurgitation - mild  Normal right ventricular systolic function   Past Medical History:  Diagnosis Date  . Adenomatous polyp   . Anxiety    takes Ativan daily as needed  . Arthritis    inflammatory arthritis (Dr. Dagoberto Ligas)  . Asthma   . Chronic back pain    scoliosis   . Constipation    takes miralax every other day  . Depression    takes Lexapro daily  . Dysrhythmia   . Eczema   . Essential hypertension, benign    takes Hyzaar and Metoprolol daily  . GERD (gastroesophageal reflux disease)   . History of blood transfusion   . History of bronchitis 5 rys ago  . History of colon polyps   . History of shingles   . Hyperlipidemia    takes Atorvastatin daily  . Insomnia    takes Melatonin nightly  . Joint pain   . Pneumonia as a child  . PSVT (paroxysmal supraventricular tachycardia) ([place])   . Recurrent upper respiratory infection (URI)   . Rhinitis, allergic    uses Flonase daily  . Urinary urgency   . Urticaria     Past Surgical History:  Procedure Laterality Date  . ADENOIDECTOMY    . APPENDECTOMY    . BACK SURGERY  2012   at age 4 d/t  scoliosis  . BLEPHAROPLASTY  2018  . BREAST BIOPSY    . cataract surgery    . COLONOSCOPY    . KNEE ARTHROSCOPY Bilateral   . neuroma removed from foot     unsure of which foot  . SHOULDER SURGERY Right   . TONSILLECTOMY    . TOTAL HIP ARTHROPLASTY Right 04/15/2014   Procedure: RIGHT TOTAL HIP ARTHROPLASTY ANTERIOR APPROACH;  Surgeon: Renette Butters, MD;  Location: [facility];  Service: Orthopedics;  Laterality: Right;    MEDICATIONS: . amitriptyline (ELAVIL) 10 MG tablet  . aspirin 81 MG tablet  . atorvastatin (LIPITOR) 80 MG tablet  . Azelastine HCl 137 MCG/SPRAY SOLN  . cetirizine (ZYRTEC) 10 MG tablet  . chlorthalidone (HYGROTON) 25 MG tablet  . Cholecalciferol (VITAMIN D3) 5000 units CAPS  . dextromethorphan-guaiFENesin [facility]  DM) 30-600 MG 12hr tablet  . escitalopram (LEXAPRO) 20 MG tablet  . fish oil-omega-3 fatty acids 1000 MG capsule  . fluticasone (FLONASE) 50 MCG/ACT nasal spray  . folic acid (FOLVITE) 1 MG tablet  . furosemide (LASIX) 20 MG tablet  . HYDROcodone-acetaminophen (NORCO/VICODIN) 5-325 MG tablet  . indomethacin (INDOCIN SR) 75 MG CR capsule  . irbesartan (AVAPRO) 300 MG tablet  . methotrexate 25 MG/ML injection  . montelukast (SINGULAIR) 10 MG tablet  . Multiple Vitamin (MULTIVITAMIN) capsule  . omeprazole (PRILOSEC) 40 MG capsule  . polyethylene glycol (MIRALAX / GLYCOLAX) packet  . predniSONE (DELTASONE) 5 MG tablet  . PROAIR HFA 108 (90 Base) MCG/ACT inhaler  . pseudoephedrine (SUDAFED) 30 MG tablet  . ranitidine (ZANTAC) 300 MG tablet  . traZODone (DESYREL) 50 MG tablet  . valACYclovir (VALTREX) 1000 MG tablet  . vitamin C (ASCORBIC ACID) 500 MG tablet   . Benralizumab SOSY 30 mg  Benralizumab is Q 8 weeks.    George Hugh [facility] Short Stay Center/Anesthesiology Phone [phone] 02/08/2018 12:59 PM

## 2018-02-08 NOTE — Anesthesia Preprocedure Evaluation (Addendum)
Anesthesia Evaluation  Patient identified by MRN, date of birth, ID band Patient awake    Reviewed: Allergy & Precautions, NPO status , Patient's Chart, lab work & pertinent test results  History of Anesthesia Complications Negative for: history of anesthetic complications  Airway Mallampati: II  TM Distance: >3 FB Neck ROM: Full    Dental  (+) Edentulous Upper, Dental Advisory Given   Pulmonary asthma , former smoker,    Pulmonary exam normal        Cardiovascular hypertension, Normal cardiovascular exam  Nuclear stress test 07/28/17 ([place]): Impressions: - Normal myocardial perfusion study - No evidence for significant ischemia or scar is noted. - Post stress: Global systolic function is hyperdynamic. The ejection  fraction was greater than 65%. Left ventricular chamber size is relatively  small. - Mild coronary calcifications are noted - Noted on attenuation CT is non-specific diffuse ground glass apperance  of the lungs  Echo 07/20/17 ([place]):  Normal left ventricular systolic function, ejection fraction 60 to 95%  Diastolic dysfunction - grade I (normal filling pressures)  Dilated left atrium - mild  Aortic regurgitation - mild  Normal right ventricular systolic function    Neuro/Psych PSYCHIATRIC DISORDERS Anxiety Depression    GI/Hepatic Neg liver ROS, GERD  ,  Endo/Other  negative endocrine ROS  Renal/GU negative Renal ROS     Musculoskeletal   Abdominal   Peds  Hematology negative hematology ROS (+)   Anesthesia Other Findings   Reproductive/Obstetrics                                                             Anesthesia Evaluation  Patient identified by MRN, date of birth, ID band Patient awake    Reviewed: Allergy & Precautions, H&P , NPO status , Patient's Chart, lab work & pertinent test results  Airway Mallampati:  II       Dental  (+) Teeth Intact, Dental Advidsory Given   Pulmonary former smoker,          Cardiovascular hypertension,     Neuro/Psych    GI/Hepatic GERD-  ,  Endo/Other  diabetes, Type 2  Renal/GU      Musculoskeletal  (+) Arthritis -,   Abdominal   Peds  Hematology   Anesthesia Other Findings   Reproductive/Obstetrics                            Anesthesia Physical Anesthesia Plan  ASA: II  Anesthesia Plan: General   Post-op Pain Management:    Induction: Intravenous  Airway Management Planned: LMA and Oral ETT  Additional Equipment:   Intra-op Plan:   Post-operative Plan: Extubation in OR  Informed Consent: I have reviewed the patients History and Physical, chart, labs and discussed the procedure including the risks, benefits and alternatives for the proposed anesthesia with the patient or authorized representative who has indicated his/her understanding and acceptance.   Dental Advisory Given  Plan Discussed with: CRNA, Anesthesiologist and Surgeon  Anesthesia Plan Comments:        Anesthesia Quick Evaluation  Anesthesia Physical Anesthesia Plan  ASA: II  Anesthesia Plan: General   Post-op Pain Management:    Induction: Intravenous  PONV Risk Score and Plan: 4  or greater and Ondansetron, Dexamethasone, Scopolamine patch - Pre-op and Diphenhydramine  Airway Management Planned: Oral ETT  Additional Equipment:   Intra-op Plan:   Post-operative Plan: Extubation in OR  Informed Consent: I have reviewed the patients History and Physical, chart, labs and discussed the procedure including the risks, benefits and alternatives for the proposed anesthesia with the patient or authorized representative who has indicated his/her understanding and acceptance.   Dental advisory given  Plan Discussed with: CRNA and Anesthesiologist  Anesthesia Plan Comments: (See PAT note written 02/08/2018 by  Myra Gianotti, PA-C. )       Anesthesia Quick Evaluation

## 2018-02-13 ENCOUNTER — Ambulatory Visit (INDEPENDENT_AMBULATORY_CARE_PROVIDER_SITE_OTHER): Payer: Medicare Other | Admitting: *Deleted

## 2018-02-13 DIAGNOSIS — J309 Allergic rhinitis, unspecified: Secondary | ICD-10-CM

## 2018-02-13 DIAGNOSIS — J4540 Moderate persistent asthma, uncomplicated: Secondary | ICD-10-CM | POA: Diagnosis not present

## 2018-02-13 DIAGNOSIS — I10 Essential (primary) hypertension: Secondary | ICD-10-CM | POA: Diagnosis not present

## 2018-02-15 ENCOUNTER — Inpatient Hospital Stay (HOSPITAL_COMMUNITY): Payer: Medicare Other | Admitting: Vascular Surgery

## 2018-02-15 ENCOUNTER — Inpatient Hospital Stay (HOSPITAL_COMMUNITY): Payer: Medicare Other | Admitting: Certified Registered"

## 2018-02-15 ENCOUNTER — Encounter (HOSPITAL_COMMUNITY): Payer: Self-pay | Admitting: Urology

## 2018-02-15 ENCOUNTER — Inpatient Hospital Stay (HOSPITAL_COMMUNITY)
Admission: RE | Admit: 2018-02-15 | Discharge: 2018-02-17 | DRG: 460 | Disposition: A | Discharge status: Home / Self Care | Payer: Medicare Other | Source: Ambulatory Visit | Attending: Neurosurgery | Admitting: Neurosurgery

## 2018-02-15 ENCOUNTER — Encounter (HOSPITAL_COMMUNITY)
Admission: RE | Disposition: A | Discharge status: Home / Self Care | Payer: Self-pay | Source: Ambulatory Visit | Attending: Neurosurgery

## 2018-02-15 ENCOUNTER — Inpatient Hospital Stay (HOSPITAL_COMMUNITY): Payer: Medicare Other

## 2018-02-15 DIAGNOSIS — M4125 Other idiopathic scoliosis, thoracolumbar region: Secondary | ICD-10-CM | POA: Diagnosis not present

## 2018-02-15 DIAGNOSIS — Z91048 Other nonmedicinal substance allergy status: Secondary | ICD-10-CM | POA: Diagnosis not present

## 2018-02-15 DIAGNOSIS — M48061 Spinal stenosis, lumbar region without neurogenic claudication: Secondary | ICD-10-CM | POA: Diagnosis not present

## 2018-02-15 DIAGNOSIS — M4325 Fusion of spine, thoracolumbar region: Secondary | ICD-10-CM | POA: Diagnosis not present

## 2018-02-15 DIAGNOSIS — Z981 Arthrodesis status: Secondary | ICD-10-CM

## 2018-02-15 DIAGNOSIS — R402363 Coma scale, best motor response, obeys commands, at hospital admission: Secondary | ICD-10-CM | POA: Diagnosis present

## 2018-02-15 DIAGNOSIS — R402253 Coma scale, best verbal response, oriented, at hospital admission: Secondary | ICD-10-CM | POA: Diagnosis present

## 2018-02-15 DIAGNOSIS — Z791 Long term (current) use of non-steroidal anti-inflammatories (NSAID): Secondary | ICD-10-CM

## 2018-02-15 DIAGNOSIS — I10 Essential (primary) hypertension: Secondary | ICD-10-CM | POA: Diagnosis not present

## 2018-02-15 DIAGNOSIS — M5116 Intervertebral disc disorders with radiculopathy, lumbar region: Secondary | ICD-10-CM | POA: Diagnosis present

## 2018-02-15 DIAGNOSIS — M4805 Spinal stenosis, thoracolumbar region: Secondary | ICD-10-CM | POA: Diagnosis not present

## 2018-02-15 DIAGNOSIS — M4310 Spondylolisthesis, site unspecified: Secondary | ICD-10-CM | POA: Diagnosis not present

## 2018-02-15 DIAGNOSIS — Z7952 Long term (current) use of systemic steroids: Secondary | ICD-10-CM | POA: Diagnosis not present

## 2018-02-15 DIAGNOSIS — Z79899 Other long term (current) drug therapy: Secondary | ICD-10-CM | POA: Diagnosis not present

## 2018-02-15 DIAGNOSIS — Z9181 History of falling: Secondary | ICD-10-CM | POA: Diagnosis not present

## 2018-02-15 DIAGNOSIS — M545 Low back pain: Secondary | ICD-10-CM | POA: Diagnosis not present

## 2018-02-15 DIAGNOSIS — M4135 Thoracogenic scoliosis, thoracolumbar region: Secondary | ICD-10-CM | POA: Diagnosis not present

## 2018-02-15 DIAGNOSIS — R402143 Coma scale, eyes open, spontaneous, at hospital admission: Secondary | ICD-10-CM | POA: Diagnosis not present

## 2018-02-15 DIAGNOSIS — M4315 Spondylolisthesis, thoracolumbar region: Secondary | ICD-10-CM | POA: Diagnosis not present

## 2018-02-15 DIAGNOSIS — M4316 Spondylolisthesis, lumbar region: Secondary | ICD-10-CM | POA: Diagnosis not present

## 2018-02-15 DIAGNOSIS — Z881 Allergy status to other antibiotic agents status: Secondary | ICD-10-CM

## 2018-02-15 DIAGNOSIS — Z888 Allergy status to other drugs, medicaments and biological substances status: Secondary | ICD-10-CM

## 2018-02-15 DIAGNOSIS — Z419 Encounter for procedure for purposes other than remedying health state, unspecified: Secondary | ICD-10-CM

## 2018-02-15 DIAGNOSIS — M5416 Radiculopathy, lumbar region: Secondary | ICD-10-CM | POA: Diagnosis not present

## 2018-02-15 DIAGNOSIS — M5415 Radiculopathy, thoracolumbar region: Secondary | ICD-10-CM | POA: Diagnosis not present

## 2018-02-15 DIAGNOSIS — M4186 Other forms of scoliosis, lumbar region: Secondary | ICD-10-CM | POA: Diagnosis not present

## 2018-02-15 HISTORY — PX: POSTERIOR LUMBAR FUSION 4 LEVEL: SHX6037

## 2018-02-15 SURGERY — POSTERIOR LUMBAR FUSION 4 LEVEL
Anesthesia: General | Site: Back

## 2018-02-15 MED ORDER — FAMOTIDINE 20 MG PO TABS
20.0000 mg | ORAL_TABLET | Freq: Two times a day (BID) | ORAL | Status: DC
  Administered 2018-02-15 – 2018-02-17 (×4): 20 mg via ORAL
  Filled 2018-02-15 (×4): qty 1

## 2018-02-15 MED ORDER — SODIUM CHLORIDE 0.9% FLUSH
3.0000 mL | Freq: Two times a day (BID) | INTRAVENOUS | Status: DC
  Administered 2018-02-15 – 2018-02-16 (×2): 3 mL via INTRAVENOUS

## 2018-02-15 MED ORDER — POLYVINYL ALCOHOL 1.4 % OP SOLN
1.0000 [drp] | Freq: Three times a day (TID) | OPHTHALMIC | Status: DC | PRN
  Filled 2018-02-15: qty 15

## 2018-02-15 MED ORDER — FOLIC ACID 1 MG PO TABS
3.0000 mg | ORAL_TABLET | Freq: Every day | ORAL | Status: DC
  Administered 2018-02-16 – 2018-02-17 (×2): 3 mg via ORAL
  Filled 2018-02-15 (×2): qty 3

## 2018-02-15 MED ORDER — VITAMIN D 1000 UNITS PO TABS
5000.0000 [IU] | ORAL_TABLET | Freq: Every day | ORAL | Status: DC
  Administered 2018-02-16 – 2018-02-17 (×2): 5000 [IU] via ORAL
  Filled 2018-02-15 (×2): qty 5

## 2018-02-15 MED ORDER — CEFAZOLIN SODIUM-DEXTROSE 2-4 GM/100ML-% IV SOLN
2.0000 g | Freq: Three times a day (TID) | INTRAVENOUS | Status: AC
  Administered 2018-02-15 – 2018-02-16 (×2): 2 g via INTRAVENOUS
  Filled 2018-02-15 (×2): qty 100

## 2018-02-15 MED ORDER — CHLORHEXIDINE GLUCONATE CLOTH 2 % EX PADS: 6.0000 | MEDICATED_PAD | Freq: Once | CUTANEOUS | Status: DC

## 2018-02-15 MED ORDER — LIDOCAINE 2% (20 MG/ML) 5 ML SYRINGE
INTRAMUSCULAR | Status: DC | PRN
  Administered 2018-02-15: 80 mg via INTRAVENOUS

## 2018-02-15 MED ORDER — POLYETHYLENE GLYCOL 3350 17 G PO PACK: 17.0000 g | PACK | Freq: Every day | ORAL | Status: DC | PRN

## 2018-02-15 MED ORDER — FLUTICASONE PROPIONATE 50 MCG/ACT NA SUSP
1.0000 | Freq: Two times a day (BID) | NASAL | Status: DC
  Administered 2018-02-15 – 2018-02-17 (×4): 1 via NASAL
  Filled 2018-02-15: qty 16

## 2018-02-15 MED ORDER — ONDANSETRON HCL 4 MG/2ML IJ SOLN: 4.0000 mg | Freq: Four times a day (QID) | INTRAMUSCULAR | Status: DC | PRN

## 2018-02-15 MED ORDER — MORPHINE SULFATE (PF) 2 MG/ML IV SOLN
2.0000 mg | INTRAVENOUS | Status: DC | PRN
  Administered 2018-02-15: 2 mg via INTRAVENOUS
  Filled 2018-02-15: qty 1

## 2018-02-15 MED ORDER — ESCITALOPRAM OXALATE 10 MG PO TABS
20.0000 mg | ORAL_TABLET | Freq: Every day | ORAL | Status: DC
  Administered 2018-02-16 – 2018-02-17 (×2): 20 mg via ORAL
  Filled 2018-02-15 (×2): qty 2

## 2018-02-15 MED ORDER — SODIUM CHLORIDE 0.9 % IJ SOLN
INTRAMUSCULAR | Status: DC | PRN
  Administered 2018-02-15: 20 mL

## 2018-02-15 MED ORDER — GLYCOPYRROLATE PF 0.2 MG/ML IJ SOSY
PREFILLED_SYRINGE | INTRAMUSCULAR | Status: DC | PRN
  Administered 2018-02-15: .2 mg via INTRAVENOUS

## 2018-02-15 MED ORDER — INDOMETHACIN ER 75 MG PO CPCR
75.0000 mg | ORAL_CAPSULE | Freq: Every day | ORAL | Status: DC
  Administered 2018-02-16 – 2018-02-17 (×2): 75 mg via ORAL
  Filled 2018-02-15 (×2): qty 1

## 2018-02-15 MED ORDER — ROCURONIUM BROMIDE 10 MG/ML (PF) SYRINGE
PREFILLED_SYRINGE | INTRAVENOUS | Status: DC | PRN
  Administered 2018-02-15: 50 mg via INTRAVENOUS

## 2018-02-15 MED ORDER — BUPIVACAINE LIPOSOME 1.3 % IJ SUSP
20.0000 mL | INTRAMUSCULAR | Status: AC
  Administered 2018-02-15: 20 mL
  Filled 2018-02-15: qty 20

## 2018-02-15 MED ORDER — POLYETHYLENE GLYCOL 3350 17 G PO PACK
17.0000 g | PACK | Freq: Every day | ORAL | Status: DC
  Administered 2018-02-16 – 2018-02-17 (×2): 17 g via ORAL
  Filled 2018-02-15 (×2): qty 1

## 2018-02-15 MED ORDER — FLEET ENEMA 7-19 GM/118ML RE ENEM: 1.0000 | ENEMA | Freq: Once | RECTAL | Status: DC | PRN

## 2018-02-15 MED ORDER — OXYCODONE HCL 5 MG PO TABS
ORAL_TABLET | ORAL | Status: AC
  Filled 2018-02-15: qty 1

## 2018-02-15 MED ORDER — EPHEDRINE 5 MG/ML INJ
INTRAVENOUS | Status: AC
  Filled 2018-02-15: qty 10

## 2018-02-15 MED ORDER — CEFAZOLIN SODIUM-DEXTROSE 2-4 GM/100ML-% IV SOLN
2.0000 g | INTRAVENOUS | Status: AC
  Administered 2018-02-15: 2 g via INTRAVENOUS

## 2018-02-15 MED ORDER — ROCURONIUM BROMIDE 50 MG/5ML IV SOSY
PREFILLED_SYRINGE | INTRAVENOUS | Status: AC
  Filled 2018-02-15: qty 5

## 2018-02-15 MED ORDER — PHENYLEPHRINE 40 MCG/ML (10ML) SYRINGE FOR IV PUSH (FOR BLOOD PRESSURE SUPPORT)
PREFILLED_SYRINGE | INTRAVENOUS | Status: AC
  Filled 2018-02-15: qty 30

## 2018-02-15 MED ORDER — THROMBIN 5000 UNITS EX SOLR
CUTANEOUS | Status: AC
  Filled 2018-02-15: qty 10000

## 2018-02-15 MED ORDER — HYDROMORPHONE HCL 1 MG/ML IJ SOLN
INTRAMUSCULAR | Status: AC
  Filled 2018-02-15: qty 1

## 2018-02-15 MED ORDER — SUFENTANIL CITRATE 50 MCG/ML IV SOLN
INTRAVENOUS | Status: AC
  Filled 2018-02-15: qty 1

## 2018-02-15 MED ORDER — CEFAZOLIN SODIUM-DEXTROSE 2-4 GM/100ML-% IV SOLN
INTRAVENOUS | Status: AC
  Filled 2018-02-15: qty 100

## 2018-02-15 MED ORDER — METHOCARBAMOL 500 MG PO TABS
500.0000 mg | ORAL_TABLET | Freq: Four times a day (QID) | ORAL | Status: DC | PRN
  Administered 2018-02-15 – 2018-02-17 (×5): 500 mg via ORAL
  Filled 2018-02-15 (×4): qty 1

## 2018-02-15 MED ORDER — SODIUM CHLORIDE 0.9 % IV SOLN: 250.0000 mL | INTRAVENOUS | Status: DC

## 2018-02-15 MED ORDER — CHLORTHALIDONE 25 MG PO TABS
12.5000 mg | ORAL_TABLET | Freq: Every day | ORAL | Status: DC
  Administered 2018-02-15 – 2018-02-17 (×3): 12.5 mg via ORAL
  Filled 2018-02-15 (×3): qty 1

## 2018-02-15 MED ORDER — PREDNISONE 5 MG PO TABS
5.0000 mg | ORAL_TABLET | Freq: Every day | ORAL | Status: DC
  Administered 2018-02-16 – 2018-02-17 (×2): 5 mg via ORAL
  Filled 2018-02-15 (×2): qty 1

## 2018-02-15 MED ORDER — BENRALIZUMAB 30 MG/ML ~~LOC~~ SOSY: 30.0000 mg | PREFILLED_SYRINGE | SUBCUTANEOUS | Status: DC

## 2018-02-15 MED ORDER — ONDANSETRON HCL 4 MG PO TABS: 4.0000 mg | ORAL_TABLET | Freq: Four times a day (QID) | ORAL | Status: DC | PRN

## 2018-02-15 MED ORDER — ASPIRIN EC 81 MG PO TBEC
81.0000 mg | DELAYED_RELEASE_TABLET | Freq: Every day | ORAL | Status: DC
  Administered 2018-02-15 – 2018-02-17 (×3): 81 mg via ORAL
  Filled 2018-02-15 (×3): qty 1

## 2018-02-15 MED ORDER — ACETAMINOPHEN 325 MG PO TABS
650.0000 mg | ORAL_TABLET | ORAL | Status: DC | PRN
  Administered 2018-02-17: 650 mg via ORAL
  Filled 2018-02-15: qty 2

## 2018-02-15 MED ORDER — OMEGA-3-ACID ETHYL ESTERS 1 G PO CAPS
1.0000 g | ORAL_CAPSULE | Freq: Every day | ORAL | Status: DC
  Administered 2018-02-15 – 2018-02-17 (×3): 1 g via ORAL
  Filled 2018-02-15 (×3): qty 1

## 2018-02-15 MED ORDER — PHENYLEPHRINE 40 MCG/ML (10ML) SYRINGE FOR IV PUSH (FOR BLOOD PRESSURE SUPPORT)
PREFILLED_SYRINGE | INTRAVENOUS | Status: DC | PRN
  Administered 2018-02-15 (×3): 80 ug via INTRAVENOUS

## 2018-02-15 MED ORDER — VALACYCLOVIR HCL 500 MG PO TABS
1000.0000 mg | ORAL_TABLET | Freq: Every day | ORAL | Status: DC
  Administered 2018-02-15 – 2018-02-17 (×3): 1000 mg via ORAL
  Filled 2018-02-15 (×3): qty 2

## 2018-02-15 MED ORDER — LORATADINE 10 MG PO TABS
10.0000 mg | ORAL_TABLET | Freq: Every day | ORAL | Status: DC
  Administered 2018-02-15 – 2018-02-17 (×3): 10 mg via ORAL
  Filled 2018-02-15 (×3): qty 1

## 2018-02-15 MED ORDER — ZOLPIDEM TARTRATE 5 MG PO TABS: 5.0000 mg | ORAL_TABLET | Freq: Every evening | ORAL | Status: DC | PRN

## 2018-02-15 MED ORDER — KCL IN DEXTROSE-NACL 20-5-0.45 MEQ/L-%-% IV SOLN
INTRAVENOUS | Status: DC
  Administered 2018-02-15: 18:00:00 via INTRAVENOUS
  Filled 2018-02-15: qty 1000

## 2018-02-15 MED ORDER — FUROSEMIDE 20 MG PO TABS
10.0000 mg | ORAL_TABLET | Freq: Every day | ORAL | Status: DC
  Administered 2018-02-15 – 2018-02-17 (×3): 10 mg via ORAL
  Filled 2018-02-15 (×3): qty 1

## 2018-02-15 MED ORDER — DEXAMETHASONE SODIUM PHOSPHATE 10 MG/ML IJ SOLN
INTRAMUSCULAR | Status: DC | PRN
  Administered 2018-02-15: 4 mg via INTRAVENOUS

## 2018-02-15 MED ORDER — METHOCARBAMOL 500 MG PO TABS
ORAL_TABLET | ORAL | Status: AC
  Filled 2018-02-15: qty 1

## 2018-02-15 MED ORDER — OXYCODONE HCL 5 MG PO TABS
5.0000 mg | ORAL_TABLET | ORAL | Status: DC | PRN
  Administered 2018-02-15 – 2018-02-17 (×6): 5 mg via ORAL
  Filled 2018-02-15 (×6): qty 1

## 2018-02-15 MED ORDER — PHENOL 1.4 % MT LIQD: 1.0000 | OROMUCOSAL | Status: DC | PRN

## 2018-02-15 MED ORDER — PROPOFOL 10 MG/ML IV BOLUS
INTRAVENOUS | Status: AC
  Filled 2018-02-15: qty 20

## 2018-02-15 MED ORDER — ONDANSETRON HCL 4 MG/2ML IJ SOLN
INTRAMUSCULAR | Status: DC | PRN
  Administered 2018-02-15: 4 mg via INTRAVENOUS

## 2018-02-15 MED ORDER — ADULT MULTIVITAMIN W/MINERALS CH
1.0000 | ORAL_TABLET | Freq: Every day | ORAL | Status: DC
  Administered 2018-02-16 – 2018-02-17 (×2): 1 via ORAL
  Filled 2018-02-15 (×3): qty 1

## 2018-02-15 MED ORDER — SCOPOLAMINE 1 MG/3DAYS TD PT72
MEDICATED_PATCH | TRANSDERMAL | Status: AC
  Filled 2018-02-15: qty 1

## 2018-02-15 MED ORDER — PROPOFOL 10 MG/ML IV BOLUS
INTRAVENOUS | Status: DC | PRN
  Administered 2018-02-15: 160 mg via INTRAVENOUS

## 2018-02-15 MED ORDER — TRAZODONE HCL 100 MG PO TABS
100.0000 mg | ORAL_TABLET | Freq: Every day | ORAL | Status: DC
  Administered 2018-02-15 – 2018-02-16 (×2): 100 mg via ORAL
  Filled 2018-02-15 (×2): qty 1

## 2018-02-15 MED ORDER — ATORVASTATIN CALCIUM 40 MG PO TABS
40.0000 mg | ORAL_TABLET | Freq: Every day | ORAL | Status: DC
  Administered 2018-02-15 – 2018-02-17 (×3): 40 mg via ORAL
  Filled 2018-02-15 (×3): qty 1

## 2018-02-15 MED ORDER — SUFENTANIL CITRATE 50 MCG/ML IV SOLN
INTRAVENOUS | Status: DC | PRN
  Administered 2018-02-15 (×3): 5 ug via INTRAVENOUS
  Administered 2018-02-15: 10 ug via INTRAVENOUS
  Administered 2018-02-15: 5 ug via INTRAVENOUS
  Administered 2018-02-15: 20 ug via INTRAVENOUS

## 2018-02-15 MED ORDER — SODIUM CHLORIDE 0.9% FLUSH: 3.0000 mL | INTRAVENOUS | Status: DC | PRN

## 2018-02-15 MED ORDER — THROMBIN 5000 UNITS EX SOLR
OROMUCOSAL | Status: DC | PRN
  Administered 2018-02-15: 12:00:00 via TOPICAL

## 2018-02-15 MED ORDER — LIDOCAINE-EPINEPHRINE 1 %-1:100000 IJ SOLN
INTRAMUSCULAR | Status: AC
  Filled 2018-02-15: qty 1

## 2018-02-15 MED ORDER — ALUM & MAG HYDROXIDE-SIMETH 200-200-20 MG/5ML PO SUSP: 30.0000 mL | Freq: Four times a day (QID) | ORAL | Status: DC | PRN

## 2018-02-15 MED ORDER — LACTATED RINGERS IV SOLN
INTRAVENOUS | Status: DC
  Administered 2018-02-15 (×2): via INTRAVENOUS

## 2018-02-15 MED ORDER — LIDOCAINE-EPINEPHRINE 1 %-1:100000 IJ SOLN
INTRAMUSCULAR | Status: DC | PRN
  Administered 2018-02-15: 10 mL

## 2018-02-15 MED ORDER — VITAMIN C 500 MG PO TABS
500.0000 mg | ORAL_TABLET | Freq: Every day | ORAL | Status: DC
  Administered 2018-02-16 – 2018-02-17 (×2): 500 mg via ORAL
  Filled 2018-02-15 (×2): qty 1

## 2018-02-15 MED ORDER — DOCUSATE SODIUM 100 MG PO CAPS
100.0000 mg | ORAL_CAPSULE | Freq: Two times a day (BID) | ORAL | Status: DC
  Administered 2018-02-15 – 2018-02-17 (×4): 100 mg via ORAL
  Filled 2018-02-15 (×4): qty 1

## 2018-02-15 MED ORDER — MENTHOL 3 MG MT LOZG: 1.0000 | LOZENGE | OROMUCOSAL | Status: DC | PRN

## 2018-02-15 MED ORDER — BISACODYL 10 MG RE SUPP: 10.0000 mg | Freq: Every day | RECTAL | Status: DC | PRN

## 2018-02-15 MED ORDER — THROMBIN (RECOMBINANT) 20000 UNITS EX SOLR
CUTANEOUS | Status: AC
  Filled 2018-02-15: qty 20000

## 2018-02-15 MED ORDER — HYDROCODONE-ACETAMINOPHEN 5-325 MG PO TABS: 1.0000 | ORAL_TABLET | Freq: Four times a day (QID) | ORAL | Status: DC | PRN

## 2018-02-15 MED ORDER — PANTOPRAZOLE SODIUM 40 MG IV SOLR
40.0000 mg | Freq: Every day | INTRAVENOUS | Status: DC
  Administered 2018-02-15 – 2018-02-16 (×2): 40 mg via INTRAVENOUS
  Filled 2018-02-15 (×2): qty 40

## 2018-02-15 MED ORDER — AZELASTINE HCL 0.1 % NA SOLN
1.0000 | Freq: Two times a day (BID) | NASAL | Status: DC
  Administered 2018-02-15 – 2018-02-17 (×4): 1 via NASAL
  Filled 2018-02-15: qty 30

## 2018-02-15 MED ORDER — PANTOPRAZOLE SODIUM 40 MG PO TBEC: 40.0000 mg | DELAYED_RELEASE_TABLET | Freq: Every day | ORAL | Status: DC

## 2018-02-15 MED ORDER — DM-GUAIFENESIN ER 30-600 MG PO TB12
1.0000 | ORAL_TABLET | Freq: Every day | ORAL | Status: DC | PRN
  Filled 2018-02-15: qty 1

## 2018-02-15 MED ORDER — LIDOCAINE 2% (20 MG/ML) 5 ML SYRINGE
INTRAMUSCULAR | Status: AC
  Filled 2018-02-15: qty 5

## 2018-02-15 MED ORDER — IRBESARTAN 300 MG PO TABS
300.0000 mg | ORAL_TABLET | Freq: Every day | ORAL | Status: DC
  Administered 2018-02-15 – 2018-02-17 (×3): 300 mg via ORAL
  Filled 2018-02-15 (×3): qty 1

## 2018-02-15 MED ORDER — 0.9 % SODIUM CHLORIDE (POUR BTL) OPTIME
TOPICAL | Status: DC | PRN
  Administered 2018-02-15: 1000 mL

## 2018-02-15 MED ORDER — AMITRIPTYLINE HCL 10 MG PO TABS
10.0000 mg | ORAL_TABLET | Freq: Every day | ORAL | Status: DC
  Administered 2018-02-15 – 2018-02-16 (×2): 10 mg via ORAL
  Filled 2018-02-15 (×2): qty 1

## 2018-02-15 MED ORDER — BUPIVACAINE HCL (PF) 0.5 % IJ SOLN
INTRAMUSCULAR | Status: AC
  Filled 2018-02-15: qty 30

## 2018-02-15 MED ORDER — ACETAMINOPHEN 650 MG RE SUPP: 650.0000 mg | RECTAL | Status: DC | PRN

## 2018-02-15 MED ORDER — GLYCOPYRROLATE PF 0.2 MG/ML IJ SOSY
PREFILLED_SYRINGE | INTRAMUSCULAR | Status: AC
  Filled 2018-02-15: qty 1

## 2018-02-15 MED ORDER — BUPIVACAINE HCL (PF) 0.5 % IJ SOLN
INTRAMUSCULAR | Status: DC | PRN
  Administered 2018-02-15: 10 mL

## 2018-02-15 MED ORDER — PSEUDOEPHEDRINE HCL 30 MG PO TABS
30.0000 mg | ORAL_TABLET | Freq: Every day | ORAL | Status: DC | PRN
  Filled 2018-02-15: qty 1

## 2018-02-15 MED ORDER — ONDANSETRON HCL 4 MG/2ML IJ SOLN
INTRAMUSCULAR | Status: AC
  Filled 2018-02-15: qty 2

## 2018-02-15 MED ORDER — MIDAZOLAM HCL 2 MG/2ML IJ SOLN
INTRAMUSCULAR | Status: AC
  Filled 2018-02-15: qty 2

## 2018-02-15 MED ORDER — MONTELUKAST SODIUM 10 MG PO TABS
10.0000 mg | ORAL_TABLET | Freq: Every day | ORAL | Status: DC
  Administered 2018-02-15 – 2018-02-16 (×2): 10 mg via ORAL
  Filled 2018-02-15 (×2): qty 1

## 2018-02-15 MED ORDER — HYDROCODONE-ACETAMINOPHEN 5-325 MG PO TABS
2.0000 | ORAL_TABLET | ORAL | Status: DC | PRN
  Administered 2018-02-15 – 2018-02-17 (×5): 2 via ORAL
  Filled 2018-02-15 (×5): qty 2

## 2018-02-15 MED ORDER — PROMETHAZINE HCL 25 MG/ML IJ SOLN: 6.2500 mg | INTRAMUSCULAR | Status: DC | PRN

## 2018-02-15 MED ORDER — THROMBIN 20000 UNITS EX SOLR
CUTANEOUS | Status: DC | PRN
  Administered 2018-02-15: 12:00:00 via TOPICAL

## 2018-02-15 MED ORDER — METHOCARBAMOL 1000 MG/10ML IJ SOLN
500.0000 mg | Freq: Four times a day (QID) | INTRAVENOUS | Status: DC | PRN
  Filled 2018-02-15: qty 5

## 2018-02-15 MED ORDER — PHENYLEPHRINE HCL 10 MG/ML IJ SOLN
INTRAMUSCULAR | Status: AC
  Filled 2018-02-15: qty 1

## 2018-02-15 MED ORDER — SODIUM CHLORIDE 0.9 % IV SOLN
INTRAVENOUS | Status: DC | PRN
  Administered 2018-02-15: 50 ug/min via INTRAVENOUS

## 2018-02-15 MED ORDER — MIDAZOLAM HCL 5 MG/5ML IJ SOLN
INTRAMUSCULAR | Status: DC | PRN
  Administered 2018-02-15: 2 mg via INTRAVENOUS

## 2018-02-15 MED ORDER — ALBUTEROL SULFATE (2.5 MG/3ML) 0.083% IN NEBU: 2.5000 mg | INHALATION_SOLUTION | RESPIRATORY_TRACT | Status: DC | PRN

## 2018-02-15 MED ORDER — SCOPOLAMINE 1 MG/3DAYS TD PT72
1.0000 | MEDICATED_PATCH | TRANSDERMAL | Status: DC
  Administered 2018-02-15: 1.5 mg via TRANSDERMAL

## 2018-02-15 MED ORDER — HYDROMORPHONE HCL 1 MG/ML IJ SOLN
0.2500 mg | INTRAMUSCULAR | Status: DC | PRN
  Administered 2018-02-15 (×4): 0.5 mg via INTRAVENOUS

## 2018-02-15 MED ORDER — DEXAMETHASONE SODIUM PHOSPHATE 10 MG/ML IJ SOLN
INTRAMUSCULAR | Status: AC
  Filled 2018-02-15: qty 1

## 2018-02-15 SURGICAL SUPPLY — 88 items
ADH SKN CLS APL DERMABOND .7 (GAUZE/BANDAGES/DRESSINGS) ×2
BASKET BONE COLLECTION (BASKET) ×2 IMPLANT
BLADE CLIPPER SURG (BLADE) IMPLANT
BONE CANC CHIPS 40CC CAN1/2 (Bone Implant) ×4 IMPLANT
BUR MATCHSTICK NEURO 3.0 LAGG (BURR) ×2 IMPLANT
BUR PRECISION FLUTE 5.0 (BURR) ×2 IMPLANT
CANISTER SUCT 3000ML PPV (MISCELLANEOUS) ×2 IMPLANT
CARTRIDGE OIL MAESTRO DRILL (MISCELLANEOUS) ×1 IMPLANT
CHIPS CANC BONE 40CC CAN1/2 (Bone Implant) ×2 IMPLANT
CONT SPEC 4OZ CLIKSEAL STRL BL (MISCELLANEOUS) ×3 IMPLANT
COVER BACK TABLE 60X90IN (DRAPES) ×2 IMPLANT
COVER WAND RF STERILE (DRAPES) ×2 IMPLANT
DECANTER SPIKE VIAL GLASS SM (MISCELLANEOUS) ×2 IMPLANT
DERMABOND ADVANCED (GAUZE/BANDAGES/DRESSINGS) ×2
DERMABOND ADVANCED .7 DNX12 (GAUZE/BANDAGES/DRESSINGS) ×1 IMPLANT
DIFFUSER DRILL AIR PNEUMATIC (MISCELLANEOUS) ×2 IMPLANT
DRAPE C-ARM 42X72 X-RAY (DRAPES) ×2 IMPLANT
DRAPE C-ARMOR (DRAPES) ×2 IMPLANT
DRAPE LAPAROTOMY 100X72X124 (DRAPES) ×2 IMPLANT
DRAPE SURG 17X23 STRL (DRAPES) ×2 IMPLANT
DRSG OPSITE POSTOP 4X10 (GAUZE/BANDAGES/DRESSINGS) ×2 IMPLANT
DURAPREP 26ML APPLICATOR (WOUND CARE) ×2 IMPLANT
ELECT REM PT RETURN 9FT ADLT (ELECTROSURGICAL) ×2
ELECTRODE REM PT RTRN 9FT ADLT (ELECTROSURGICAL) ×1 IMPLANT
EVACUATOR 1/8 PVC DRAIN (DRAIN) ×1 IMPLANT
GAUZE 4X4 16PLY RFD (DISPOSABLE) IMPLANT
GAUZE SPONGE 4X4 12PLY STRL (GAUZE/BANDAGES/DRESSINGS) ×1 IMPLANT
GLOVE BIO SURGEON STRL SZ8 (GLOVE) ×4 IMPLANT
GLOVE BIOGEL PI IND STRL 6.5 (GLOVE) IMPLANT
GLOVE BIOGEL PI IND STRL 7.5 (GLOVE) IMPLANT
GLOVE BIOGEL PI IND STRL 8 (GLOVE) ×2 IMPLANT
GLOVE BIOGEL PI IND STRL 8.5 (GLOVE) ×2 IMPLANT
GLOVE BIOGEL PI INDICATOR 6.5 (GLOVE) ×1
GLOVE BIOGEL PI INDICATOR 7.5 (GLOVE) ×3
GLOVE BIOGEL PI INDICATOR 8 (GLOVE) ×2
GLOVE BIOGEL PI INDICATOR 8.5 (GLOVE) ×2
GLOVE ECLIPSE 6.5 STRL STRAW (GLOVE) ×1 IMPLANT
GLOVE ECLIPSE 8.0 STRL XLNG CF (GLOVE) ×4 IMPLANT
GLOVE EXAM NITRILE XL STR (GLOVE) IMPLANT
GLOVE SURG SS PI 6.5 STRL IVOR (GLOVE) ×2 IMPLANT
GLOVE SURG SS PI 7.0 STRL IVOR (GLOVE) ×4 IMPLANT
GOWN STRL REUS W/ TWL LRG LVL3 (GOWN DISPOSABLE) IMPLANT
GOWN STRL REUS W/ TWL XL LVL3 (GOWN DISPOSABLE) ×2 IMPLANT
GOWN STRL REUS W/TWL 2XL LVL3 (GOWN DISPOSABLE) ×4 IMPLANT
GOWN STRL REUS W/TWL LRG LVL3 (GOWN DISPOSABLE) ×8
GOWN STRL REUS W/TWL XL LVL3 (GOWN DISPOSABLE) ×4
GRAFT BNE CHIP CANC 1-8 40 (Bone Implant) IMPLANT
HEMOSTAT POWDER KIT SURGIFOAM (HEMOSTASIS) ×2 IMPLANT
KIT BASIN OR (CUSTOM PROCEDURE TRAY) ×2 IMPLANT
KIT INFUSE SMALL (Orthopedic Implant) ×1 IMPLANT
KIT POSITION SURG JACKSON T1 (MISCELLANEOUS) ×2 IMPLANT
KIT TURNOVER KIT B (KITS) ×2 IMPLANT
MILL MEDIUM DISP (BLADE) ×2 IMPLANT
NDL HYPO 21X1.5 SAFETY (NEEDLE) IMPLANT
NDL HYPO 25X1 1.5 SAFETY (NEEDLE) ×1 IMPLANT
NDL SPNL 18GX3.5 QUINCKE PK (NEEDLE) IMPLANT
NEEDLE HYPO 21X1.5 SAFETY (NEEDLE) ×2 IMPLANT
NEEDLE HYPO 25X1 1.5 SAFETY (NEEDLE) ×2 IMPLANT
NEEDLE SPNL 18GX3.5 QUINCKE PK (NEEDLE) IMPLANT
NS IRRIG 1000ML POUR BTL (IV SOLUTION) ×2 IMPLANT
OIL CARTRIDGE MAESTRO DRILL (MISCELLANEOUS) ×2
PACK LAMINECTOMY NEURO (CUSTOM PROCEDURE TRAY) ×2 IMPLANT
PAD ARMBOARD 7.5X6 YLW CONV (MISCELLANEOUS) ×6 IMPLANT
PATTIES SURGICAL .5 X.5 (GAUZE/BANDAGES/DRESSINGS) IMPLANT
PATTIES SURGICAL .5 X1 (DISPOSABLE) IMPLANT
PATTIES SURGICAL 1X1 (DISPOSABLE) IMPLANT
ROD ARM15T 300MM SPINAL (Rod) ×1 IMPLANT
ROD RELINE TI LATERAL MED OFF (Rod) ×1 IMPLANT
SCREW LOCK (Screw) ×28 IMPLANT
SCREW LOCK 100X5.5X OPN (Screw) IMPLANT
SCREW POLY 5.5X45MM (Screw) ×4 IMPLANT
SCREW POST PA ARMADA 4.5X45 (Screw) ×2 IMPLANT
SCREW POST PA ARMADA 4X35 (Screw) ×2 IMPLANT
SCREW POST PA ARMADA 4X40 (Screw) ×2 IMPLANT
SPONGE LAP 4X18 RFD (DISPOSABLE) IMPLANT
SPONGE SURGIFOAM ABS GEL 100 (HEMOSTASIS) ×1 IMPLANT
STAPLER SKIN PROX WIDE 3.9 (STAPLE) IMPLANT
SUT VIC AB 1 CT1 18XBRD ANBCTR (SUTURE) ×2 IMPLANT
SUT VIC AB 1 CT1 8-18 (SUTURE) ×6
SUT VIC AB 2-0 CT1 18 (SUTURE) ×5 IMPLANT
SUT VIC AB 3-0 SH 8-18 (SUTURE) ×5 IMPLANT
SYR 5ML LL (SYRINGE) IMPLANT
SYRINGE 20CC LL (MISCELLANEOUS) ×2 IMPLANT
TOWEL GREEN STERILE (TOWEL DISPOSABLE) ×2 IMPLANT
TOWEL GREEN STERILE FF (TOWEL DISPOSABLE) ×2 IMPLANT
TRAP SPECIMEN MUCOUS 40CC (MISCELLANEOUS) ×1 IMPLANT
TRAY FOLEY MTR SLVR 16FR STAT (SET/KITS/TRAYS/PACK) ×2 IMPLANT
WATER STERILE IRR 1000ML POUR (IV SOLUTION) ×2 IMPLANT

## 2018-02-15 NOTE — Anesthesia Procedure Notes (Signed)
Procedure Name: Intubation Date/Time: 02/15/2018 10:22 AM Performed by: Moshe Salisbury, CRNA Pre-anesthesia Checklist: Patient identified, Emergency Drugs available, Suction available and Patient being monitored Patient Re-evaluated:Patient Re-evaluated prior to induction Oxygen Delivery Method: Circle System Utilized Preoxygenation: Pre-oxygenation with 100% oxygen Induction Type: IV induction Ventilation: Mask ventilation without difficulty Laryngoscope Size: Mac and 3 Grade View: Grade I Tube type: Oral Tube size: 7.5 mm Number of attempts: 1 Airway Equipment and Method: Stylet Placement Confirmation: ETT inserted through vocal cords under direct vision,  positive ETCO2 and breath sounds checked- equal and bilateral Secured at: 20 cm Tube secured with: Tape Dental Injury: Teeth and Oropharynx as per pre-operative assessment

## 2018-02-15 NOTE — Transfer of Care (Signed)
Immediate Anesthesia Transfer of Care Note  Patient: Vanessa Harmon  Procedure(s) Performed: Decompression and fusion Thoracic nine to Lumbar two with exploration of previous fusion (N/A Back)  Patient Location: PACU  Anesthesia Type:General  Level of Consciousness: awake and patient cooperative  Airway & Oxygen Therapy: Patient Spontanous Breathing and Patient connected to nasal cannula oxygen  Post-op Assessment: Report given to RN, Post -op Vital signs reviewed and stable and Patient moving all extremities  Post vital signs: Reviewed and stable  Last Vitals:  Vitals Value Taken Time  BP 101/91 02/15/2018  2:22 PM  Temp 36.7 C 02/15/2018  2:22 PM  Pulse 63 02/15/2018  2:26 PM  Resp 8 02/15/2018  2:26 PM  SpO2 95 % 02/15/2018  2:26 PM  Vitals shown include unvalidated device data.  Last Pain:  Vitals:   02/15/18 0840  TempSrc:   PainSc: 4          Complications: No apparent anesthesia complications

## 2018-02-15 NOTE — Brief Op Note (Signed)
02/15/2018  2:23 PM  PATIENT:  Vanessa Harmon  67 y.o. female  PRE-OPERATIVE DIAGNOSIS:  Spinal stenosis, Thoracolumbar region, thoracolumbar scoliosis, spondylolisthesis, radiculopathy, lumbago  POST-OPERATIVE DIAGNOSIS:  Spinal stenosis, Thoracolumbar region, thoracolumbar scoliosis, spondylolisthesis, radiculopathy, lumbago  PROCEDURE:  Procedure(s): Decompression and fusion Thoracic nine to Lumbar two with exploration of previous fusion (N/A) with pedicle screw fixation and posterolateral arthrodesis with autograft, allograft, BMP  SURGEON:  Surgeon(s) and Role:    Erline Levine, MD - Primary    * Ashok Pall, MD - Assisting  PHYSICIAN ASSISTANT:   ASSISTANTS: Poteat, RN   ANESTHESIA:   general  EBL:  150 mL   BLOOD ADMINISTERED:none  DRAINS: (Medium) Hemovact drain(s) in the paravertebral space with  Suction Open   LOCAL MEDICATIONS USED:  MARCAINE     SPECIMEN:  No Specimen  DISPOSITION OF SPECIMEN:  N/A  COUNTS:  YES  TOURNIQUET:  * No tourniquets in log *  DICTATION: Patient is 67 year old woman with lumbar scoliosis and previous fusion L 2-S1 level with stenosis at  L 12 and retrolisthesis L 12 and spinal stenosis and a long history of severe back and bilateral leg pain.  It was elected to take her to surgery for exploration of previous fusion with decompression and fusion from T 9 through L 2 levels.   Procedure: Patient was placed in a prone position on the [place] table after smooth and uncomplicated induction of general endotracheal anesthesia. Her low back was prepped and draped in usual sterile fashion with betadine scrub and DuraPrep. Area of incision was infiltrated with local lidocaine. Incision was made to the lumbodorsal fascia was incised and exposure was performed of the T 9 - L 2 spinous processes laminae facet joint and transverse processes. Previous hardware was exposed.  This was covered with dense bone growth and required a chisel to remove  the bone to expose the previously placed hardware. The bone screws were exposed and appeared to be solidly in bone and we disconnected the rods from the previously placed extensions.  Intraoperative x-ray was obtained which confirmed correct orientation with marker probes at L1  Through L 4 levles. A total laminectomy of L 1 through L 2  levels was performed with disarticulation of the facet joints and thorough decompression was performed of both L1 , L 2  nerve roots along with the common dural tube. There was good correction of scoliotic curvature with fixation. The posterolateral region was extensively decorticated and facetectomies were performed from T 9 - L 1 levels with osteotomes. Pedicle probes were placed at T 9  through L1 bilaterally. Intraoperative fluoroscopy confirmed correct orientationin the AP and lateral plane. 45 x 4.5 mm pedicle screws were placed at L 1 bilaterally, 5.5 x 45 mm screws at T 12 and T 11 bilaterally, 4.5 x 40 mm screws at T 10 bilaterally and 4.5 x 35 mm screws at T 9 bilaterally. Z rod was placed on the right and a straight rod was placed on the left locked down in situ and the posterolateral region was packed with 80 cc bone graft on both sides along with small BMP and autgraft. The wound was irrigated. Long-acting Marcaine was injected in deep musculature.   A medium Hemovac drain was placed. Fascia was closed with 1 Vicryl sutures skin edges were reapproximated 2 and 3-0 Vicryl sutures. The wound was dressed with Dermabond and  an occlusive dressing the patient was extubated in the operating room and taken to recovery in  stable satisfactory condition she tolerated traction well counts were correct at the end of the case.    PLAN OF CARE: Admit to inpatient   PATIENT DISPOSITION:  PACU - hemodynamically stable.   Delay start of Pharmacological VTE agent (>24hrs) due to surgical blood loss or risk of bleeding: yes

## 2018-02-15 NOTE — Anesthesia Postprocedure Evaluation (Signed)
Anesthesia Post Note  Patient: Vanessa Harmon  Procedure(s) Performed: Decompression and fusion Thoracic nine to Lumbar two with exploration of previous fusion (N/A Back)     Patient location during evaluation: PACU Anesthesia Type: General Level of consciousness: sedated Pain management: pain level controlled Vital Signs Assessment: post-procedure vital signs reviewed and stable Respiratory status: spontaneous breathing and respiratory function stable Cardiovascular status: stable Postop Assessment: no apparent nausea or vomiting Anesthetic complications: no    Last Vitals:  Vitals:   02/15/18 1607 02/15/18 1622  BP: 132/64 135/74  Pulse: 69 68  Resp: 16 12  Temp:    SpO2: 97% 98%    Last Pain:  Vitals:   02/15/18 1630  TempSrc:   PainSc: 4                  Sheza Strickland DANIEL

## 2018-02-15 NOTE — Progress Notes (Signed)
Awake, alert, conversant.  Full strength bilateral legs.  No numbness.  Sore in back as expected.

## 2018-02-15 NOTE — Op Note (Signed)
02/15/2018  2:23 PM  PATIENT:  Vanessa Harmon  67 y.o. female  PRE-OPERATIVE DIAGNOSIS:  Spinal stenosis, Thoracolumbar region, thoracolumbar scoliosis, spondylolisthesis, radiculopathy, lumbago  POST-OPERATIVE DIAGNOSIS:  Spinal stenosis, Thoracolumbar region, thoracolumbar scoliosis, spondylolisthesis, radiculopathy, lumbago  PROCEDURE:  Procedure(s): Decompression and fusion Thoracic nine to Lumbar two with exploration of previous fusion (N/A) with pedicle screw fixation and posterolateral arthrodesis with autograft, allograft, BMP  SURGEON:  Surgeon(s) and Role:    Erline Levine, MD - Primary    * Ashok Pall, MD - Assisting  PHYSICIAN ASSISTANT:   ASSISTANTS: Poteat, RN   ANESTHESIA:   general  EBL:  150 mL   BLOOD ADMINISTERED:none  DRAINS: (Medium) Hemovact drain(s) in the paravertebral space with  Suction Open   LOCAL MEDICATIONS USED:  MARCAINE     SPECIMEN:  No Specimen  DISPOSITION OF SPECIMEN:  N/A  COUNTS:  YES  TOURNIQUET:  * No tourniquets in log *  DICTATION: Patient is 67 year old woman with lumbar scoliosis and previous fusion L 2-S1 level with stenosis at  L 12 and retrolisthesis L 12 and spinal stenosis and a long history of severe back and bilateral leg pain.  It was elected to take her to surgery for exploration of previous fusion with decompression and fusion from T 9 through L 2 levels.   Procedure: Patient was placed in a prone position on the [place] table after smooth and uncomplicated induction of general endotracheal anesthesia. Her low back was prepped and draped in usual sterile fashion with betadine scrub and DuraPrep. Area of incision was infiltrated with local lidocaine. Incision was made to the lumbodorsal fascia was incised and exposure was performed of the T 9 - L 2 spinous processes laminae facet joint and transverse processes. Previous hardware was exposed.  This was covered with dense bone growth and required a chisel to remove  the bone to expose the previously placed hardware. The bone screws were exposed and appeared to be solidly in bone and we disconnected the rods from the previously placed extensions.  Intraoperative x-ray was obtained which confirmed correct orientation with marker probes at L1  Through L 4 levles. A total laminectomy of L 1 through L 2  levels was performed with disarticulation of the facet joints and thorough decompression was performed of both L1 , L 2  nerve roots along with the common dural tube. There was good correction of scoliotic curvature with fixation. The posterolateral region was extensively decorticated and facetectomies were performed from T 9 - L 1 levels with osteotomes. Pedicle probes were placed at T 9  through L1 bilaterally. Intraoperative fluoroscopy confirmed correct orientationin the AP and lateral plane. 45 x 4.5 mm pedicle screws were placed at L 1 bilaterally, 5.5 x 45 mm screws at T 12 and T 11 bilaterally, 4.5 x 40 mm screws at T 10 bilaterally and 4.5 x 35 mm screws at T 9 bilaterally. Z rod was placed on the right and a straight rod was placed on the left locked down in situ and the posterolateral region was packed with 80 cc bone graft on both sides along with small BMP and autgraft. The wound was irrigated. Long-acting Marcaine was injected in deep musculature.   A medium Hemovac drain was placed. Fascia was closed with 1 Vicryl sutures skin edges were reapproximated 2 and 3-0 Vicryl sutures. The wound was dressed with Dermabond and  an occlusive dressing the patient was extubated in the operating room and taken to recovery in  stable satisfactory condition she tolerated traction well counts were correct at the end of the case.    PLAN OF CARE: Admit to inpatient   PATIENT DISPOSITION:  PACU - hemodynamically stable.   Delay start of Pharmacological VTE agent (>24hrs) due to surgical blood loss or risk of bleeding: yes

## 2018-02-15 NOTE — Interval H&P Note (Signed)
History and Physical Interval Note:  02/15/2018 9:50 AM  Vanessa Harmon  has presented today for surgery, with the diagnosis of Spinal stenosis, Thoracolumbar region  The various methods of treatment have been discussed with the patient and family. After consideration of risks, benefits and other options for treatment, the patient has consented to  Procedure(s) with comments: Decompression and fusion Thoracic 9 to Lumbar 2 with exploration of previous fusion (N/A) - Decompression and fusion Thoracic 9 to Lumbar 2 with exploration of previous fusion as a surgical intervention .  The patient's history has been reviewed, patient examined, no change in status, stable for surgery.  I have reviewed the patient's chart and labs.  Questions were answered to the patient's satisfaction.     Peggyann Shoals

## 2018-02-16 ENCOUNTER — Encounter (HOSPITAL_COMMUNITY): Payer: Self-pay | Admitting: Neurosurgery

## 2018-02-16 MED FILL — Heparin Sodium (Porcine) Inj 1000 Unit/ML: INTRAMUSCULAR | Qty: 30 | Status: AC

## 2018-02-16 MED FILL — Thrombin (Recombinant) For Soln 20000 Unit: CUTANEOUS | Qty: 1 | Status: AC

## 2018-02-16 MED FILL — Sodium Chloride IV Soln 0.9%: INTRAVENOUS | Qty: 1000 | Status: AC

## 2018-02-16 NOTE — Evaluation (Signed)
Physical Therapy Evaluation Patient Details Name: Vanessa Harmon MRN: [id number] DOB: 1951-03-25 Today's Date: 02/16/2018   History of Present Illness  pt is a 67 y/o female with pmh significant for PSVT, HTN, admitted with radicular pain due to stenosis, thoracolumbar scoliosis and spondyloslisthesis, pt s/p decompression and fusion T9-L2 with pedical scress and PLA with grafting.  Clinical Impression  Pt admitted with/for elective lumbar fusion surgery.  Pt currently limited functionally due to the problems listed below.  (see problems list.)  Pt will benefit from PT to maximize function and safety to be able to get home safely with available assist .     Follow Up Recommendations No PT follow up;Supervision/Assistance - 24 hour    Equipment Recommendations  None recommended by PT    Recommendations for Other Services       Precautions / Restrictions Precautions Precautions: Back Precaution Booklet Issued: No Required Braces or Orthoses: Spinal Brace Spinal Brace: Thoracolumbosacral orthotic;Applied in sitting position      Mobility  Bed Mobility Overal bed mobility: Needs Assistance Bed Mobility: Rolling;Sidelying to Sit;Sit to Sidelying Rolling: Min guard Sidelying to sit: Min guard     Sit to sidelying: Min assist General bed mobility comments: Cued for ways to build more momentum to get back in bed without min assist at Right leg  Transfers Overall transfer level: Needs assistance   Transfers: Sit to/from Stand Sit to Stand: Supervision         General transfer comment: cues for hand placement safety  Ambulation/Gait Ambulation/Gait assistance: Supervision Gait Distance (Feet): 140 Feet Assistive device: Rolling walker (2 wheeled) Gait Pattern/deviations: Step-through pattern   Gait velocity interpretation: 1.31 - 2.62 ft/sec, indicative of limited community ambulator General Gait Details: steady, moderate speed  Stairs            Wheelchair  Mobility    Modified Rankin (Stroke Patients Only)       Balance Overall balance assessment: No apparent balance deficits (not formally assessed)                                           Pertinent Vitals/Pain Pain Assessment: Faces Faces Pain Scale: Hurts little more Pain Location: back Pain Descriptors / Indicators: Guarding;Sore Pain Intervention(s): Monitored during session    Home Living Family/patient expects to be discharged to:: Private residence Living Arrangements: Spouse/significant other Available Help at Discharge: Family;Available 24 hours/day Type of Home: House Home Access: Stairs to enter Entrance Stairs-Rails: Right;Left Entrance Stairs-Number of Steps: several Home Layout: Multi-level;Able to live on main level with bedroom/bathroom Home Equipment: Gilford Rile - 2 wheels;Manufacturing systems engineer)      Prior Function Level of Independence: Independent         Comments: drove, ran errands, Independent with ADL's     Hand Dominance        Extremity/Trunk Assessment   Upper Extremity Assessment Upper Extremity Assessment: Overall WFL for tasks assessed    Lower Extremity Assessment Lower Extremity Assessment: Overall WFL for tasks assessed    Cervical / Trunk Assessment Cervical / Trunk Assessment: Other exceptions Cervical / Trunk Exceptions: surgical back  Communication   Communication: No difficulties  Cognition Arousal/Alertness: Awake/alert Behavior During Therapy: WFL for tasks assessed/performed Overall Cognitive Status: Within Functional Limits for tasks assessed  General Comments General comments (skin integrity, edema, etc.): pt instructed in back care/prec, improvements to logroll, bracing issues, lifting restrictions, progression of activity.    Exercises     Assessment/Plan    PT Assessment Patient needs continued PT services  PT Problem List Decreased  activity tolerance;Decreased mobility;Decreased knowledge of use of DME;Cardiopulmonary status limiting activity;Pain       PT Treatment Interventions Gait training;Stair training;Functional mobility training;Therapeutic activities;Patient/family education    PT Goals (Current goals can be found in the Care Plan section)  Acute Rehab PT Goals Patient Stated Goal: home and independent PT Goal Formulation: With patient Time For Goal Achievement: 02/18/18 Potential to Achieve Goals: Good    Frequency Min 5X/week   Barriers to discharge        Co-evaluation               AM-PAC PT "6 Clicks" Daily Activity  Outcome Measure Difficulty turning over in bed (including adjusting bedclothes, sheets and blankets)?: None Difficulty moving from lying on back to sitting on the side of the bed? : None Difficulty sitting down on and standing up from a chair with arms (e.g., wheelchair, bedside commode, etc,.)?: A Little Help needed moving to and from a bed to chair (including a wheelchair)?: A Little Help needed walking in hospital room?: A Little Help needed climbing 3-5 steps with a railing? : A Little 6 Click Score: 20    End of Session Equipment Utilized During Treatment: Back brace Activity Tolerance: Patient tolerated treatment well Patient left: in bed;with call bell/phone within reach Nurse Communication: Mobility status PT Visit Diagnosis: Other abnormalities of gait and mobility (R26.89);Pain Pain - part of body: (back)    Time: 9371-6967 PT Time Calculation (min) (ACUTE ONLY): 24 min   Charges:   PT Evaluation $PT Eval Low Complexity: 1 Low PT Treatments $Gait Training: 8-22 mins        02/16/2018  Donnella Sham, PT Acute Rehabilitation Services [phone]  (pager) [phone]  (office)  Tessie Fass Porsche Noguchi 02/16/2018, 10:52 AM

## 2018-02-16 NOTE — Care Management Note (Addendum)
Case Management Note  Patient Details  Name: Vanessa Harmon MRN: [id number] Date of Birth: [birth year]  Subjective/Objective:  Pt s/p thoracic-lumbar fusion. She is from home with her spouse. Pt states he is able to provide assistance at home.  DME: walker, 3 in 1, tub bench Pt denies any issues obtaining her medications. Pt's spouse provides transportation.                  Action/Plan: No f/u per PT/OT. CM following for d/c needs, physician orders.   Expected Discharge Date:                  Expected Discharge Plan:  Home/Self Care  In-House Referral:     Discharge planning Services     Post Acute Care Choice:    Choice offered to:     DME Arranged:    DME Agency:     HH Arranged:    HH Agency:     Status of Service:  In process, will continue to follow  If discussed at Long Length of Stay Meetings, dates discussed:    Additional Comments:  Pollie Friar, RN 02/16/2018, 2:29 PM

## 2018-02-16 NOTE — Evaluation (Signed)
Occupational Therapy Evaluation Patient Details Name: Vanessa Harmon MRN: [id number] DOB: 1950-10-15 Today's Date: 02/16/2018    History of Present Illness pt is a 67 y/o female with pmh significant for PSVT, HTN, admitted with radicular pain due to stenosis, thoracolumbar scoliosis and spondyloslisthesis, pt s/p decompression and fusion T9-L2 with pedical scress and PLA with grafting.   Clinical Impression   This 67 y/o female presents with the above. At baseline pt is independent with ADLs, iADLs and functional mobility. Pt completing functional mobility using RW with minguard assist this session. Demonstrates standing grooming ADLs with minguard assist, requires light minA for brace management, min-modA for LB ADLs secondary to adhering to back precautions. Pt reports she will return home with spouse who is able to assist with ADLs PRN. Reviewed back precautions, safety and compensatory strategies for performing ADLs while maintaining precautions with pt verbalizing/return demonstrating understanding. Pt will benefit from continued OT services while in acute setting to maximize her safety and independence with ADLs and mobility prior to discharge home. Will follow.     Follow Up Recommendations  No OT follow up;Supervision/Assistance - 24 hour(24hr initially)    Equipment Recommendations  None recommended by OT(pt's DME needs are met)           Precautions / Restrictions Precautions Precautions: Back Precaution Booklet Issued: No Required Braces or Orthoses: Spinal Brace Spinal Brace: Thoracolumbosacral orthotic;Applied in sitting position Restrictions Weight Bearing Restrictions: No      Mobility Bed Mobility Overal bed mobility: Needs Assistance Bed Mobility: Rolling;Sidelying to Sit Rolling: Min guard Sidelying to sit: Min guard     Sit to sidelying: Min assist General bed mobility comments: pt with good recall of log roll technique  Transfers Overall transfer  level: Needs assistance   Transfers: Sit to/from Stand Sit to Stand: Min guard         General transfer comment: cues for hand placement, safety; minguard for safety; increased effort     Balance Overall balance assessment: Mild deficits observed, not formally tested                                         ADL either performed or assessed with clinical judgement   ADL Overall ADL's : Needs assistance/impaired Eating/Feeding: Modified independent;Sitting   Grooming: Wash/dry hands;Min guard;Wash/dry face;Set up;Standing;Sitting Grooming Details (indicate cue type and reason): washing hands standing at sink during session; pt washing face after return to sitting in recliner Upper Body Bathing: Min guard;Sitting   Lower Body Bathing: Minimal assistance;Sit to/from stand;With adaptive equipment   Upper Body Dressing : Minimal assistance;Sitting Upper Body Dressing Details (indicate cue type and reason): to position TLSO, once positioned pt able to fasten given increased time to perform Lower Body Dressing: Minimal assistance;With adaptive equipment;Sit to/from stand Lower Body Dressing Details (indicate cue type and reason): pt reports familiarity with using AE to complete LB dressing; unsure if she still has sock aide at home but has Architectural technologist Transfer: Ambulation;Regular Toilet;Grab bars;Minimal assistance Toilet Transfer Details (indicate cue type and reason): minA due to lower surface height; positioned BSC over toilet end of session for increased independence Toileting- Clothing Manipulation and Hygiene: Min guard;Sit to/from stand;Sitting/lateral lean Toileting - Clothing Manipulation Details (indicate cue type and reason): pt performing peri-care after voiding bladder using lateral leans     Functional mobility during ADLs: Min guard;Rolling walker General ADL Comments: minguard  for safety as pt with intermittent mild unsteadiness during mobility with  RW; reviewed back precautions, safety and compensatory strategies for completing ADLs     Vision         Perception     Praxis      Pertinent Vitals/Pain Pain Assessment: 0-10 Pain Score: 2  Faces Pain Scale: Hurts little more Pain Location: back Pain Descriptors / Indicators: Guarding;Sore Pain Intervention(s): Monitored during session;Repositioned     Hand Dominance     Extremity/Trunk Assessment Upper Extremity Assessment Upper Extremity Assessment: Overall WFL for tasks assessed   Lower Extremity Assessment Lower Extremity Assessment: Defer to PT evaluation   Cervical / Trunk Assessment Cervical / Trunk Assessment: Other exceptions Cervical / Trunk Exceptions: surgical back   Communication Communication Communication: No difficulties   Cognition Arousal/Alertness: Awake/alert Behavior During Therapy: WFL for tasks assessed/performed Overall Cognitive Status: Within Functional Limits for tasks assessed                                     General Comments  pt instructed in back care/prec, improvements to logroll, bracing issues, lifting restrictions, progression of activity.    Exercises     Shoulder Instructions      Home Living Family/patient expects to be discharged to:: Private residence Living Arrangements: Spouse/significant other Available Help at Discharge: Family;Available 24 hours/day Type of Home: House Home Access: Stairs to enter [employer] of Steps: several Entrance Stairs-Rails: Right;Left Home Layout: Multi-level;Able to live on main level with bedroom/bathroom     Bathroom Shower/Tub: Tub/shower unit   Bathroom Toilet: Handicapped height Bathroom Accessibility: Yes   Home Equipment: Environmental consultant - 2 wheels;Manufacturing systems engineer)          Prior Functioning/Environment Level of Independence: Independent        Comments: drove, ran errands, Independent with ADL's        OT Problem List: Decreased  strength;Decreased range of motion;Decreased activity tolerance;Impaired balance (sitting and/or standing)      OT Treatment/Interventions: Self-care/ADL training;Therapeutic exercise;DME and/or AE instruction;Therapeutic activities;Patient/family education;Balance training    OT Goals(Current goals can be found in the care plan section) Acute Rehab OT Goals Patient Stated Goal: home and independent OT Goal Formulation: With patient Time For Goal Achievement: 03/02/18 Potential to Achieve Goals: Good  OT Frequency: Min 2X/week   Barriers to D/C:            Co-evaluation              AM-PAC PT "6 Clicks" Daily Activity     Outcome Measure Help from another person eating meals?: None Help from another person taking care of personal grooming?: None Help from another person toileting, which includes using toliet, bedpan, or urinal?: A Little Help from another person bathing (including washing, rinsing, drying)?: A Little Help from another person to put on and taking off regular upper body clothing?: None Help from another person to put on and taking off regular lower body clothing?: A Little 6 Click Score: 21   End of Session Equipment Utilized During Treatment: Gait belt;Rolling walker;Back brace Nurse Communication: Mobility status  Activity Tolerance: Patient tolerated treatment well Patient left: in chair;with call bell/phone within reach  OT Visit Diagnosis: Other abnormalities of gait and mobility (R26.89)                Time: 2409-7353 OT Time Calculation (min): 28 min Charges:  OT General Charges $  OT Visit: 1 Visit OT Evaluation $OT Eval Low Complexity: 1 Low OT Treatments $Self Care/Home Management : 8-22 mins  Lou Cal, OT Supplemental Rehabilitation Services Pager [phone] Office [phone]   Raymondo Band 02/16/2018, 2:21 PM

## 2018-02-16 NOTE — Progress Notes (Signed)
Subjective: Patient reports sore in back  Objective: Vital signs in last 24 hours: Temp:  [98 F (36.7 C)-99.6 F (37.6 C)] 99.6 F (37.6 C) (11/01 0813) Pulse Rate:  [56-78] 58 (11/01 0813) Resp:  [12-20] 18 (11/01 0813) BP: (101-150)/(48-91) 108/63 (11/01 0813) SpO2:  [90 %-99 %] 97 % (11/01 0813)  Intake/Output from previous day: 10/31 0701 - 11/01 0700 In: 2715.5 [P.O.:320; I.V.:2295.5; IV Piggyback:100] Out: 9628 [Urine:1520; Drains:150; Blood:150] Intake/Output this shift: No intake/output data recorded.  Physical Exam: Strength full.  Dressing CDI.  Lab Results: No results for input(s): WBC, HGB, HCT, PLT in the last 72 hours. BMET No results for input(s): NA, K, CL, CO2, GLUCOSE, BUN, CREATININE, CALCIUM in the last 72 hours.  Studies/Results: Dg Thoracolumabar Spine  Result Date: 02/15/2018 CLINICAL DATA:  Decompression and fusion of the thoracic spine, T9, to the lumbar spine, L2. EXAM: DG C-ARM 61-120 MIN; THORACOLUMBAR SPINE - 2 VIEW COMPARISON:  Lumbar MRI, 09/28/2017 FINDINGS: Portable operative imaging demonstrates placement of pedicle screws along the mid to lower thoracic spine to the L2 level. Screws appear well-seated and well-positioned. IMPRESSION: Imaging provided for thoracolumbar posterior fusion. Visualized orthopedic hardware appears well positioned. Electronically Signed   By: Lajean Manes M.D.   On: 02/15/2018 13:55   Dg C-arm 1-60 Min  Result Date: 02/15/2018 CLINICAL DATA:  Decompression and fusion of the thoracic spine, T9, to the lumbar spine, L2. EXAM: DG C-ARM 61-120 MIN; THORACOLUMBAR SPINE - 2 VIEW COMPARISON:  Lumbar MRI, 09/28/2017 FINDINGS: Portable operative imaging demonstrates placement of pedicle screws along the mid to lower thoracic spine to the L2 level. Screws appear well-seated and well-positioned. IMPRESSION: Imaging provided for thoracolumbar posterior fusion. Visualized orthopedic hardware appears well positioned.  Electronically Signed   By: Lajean Manes M.D.   On: 02/15/2018 13:55    Assessment/Plan: Mobilize today.  Continue drain.  Anticipate discharge in AM if pain is well controlled.    LOS: 1 day    Peggyann Shoals, MD 02/16/2018, 8:45 AM

## 2018-02-17 MED ORDER — OXYCODONE HCL 5 MG PO TABS: 5.0000 mg | ORAL_TABLET | ORAL | 0 refills | Status: DC | PRN

## 2018-02-17 MED ORDER — METHOCARBAMOL 500 MG PO TABS: 500.0000 mg | ORAL_TABLET | Freq: Four times a day (QID) | ORAL | 1 refills | Status: DC | PRN

## 2018-02-17 NOTE — Progress Notes (Signed)
Subjective: Patient reports doing well  Objective: Vital signs in last 24 hours: Temp:  [98.6 F (37 C)-99.6 F (37.6 C)] 98.6 F (37 C) (11/02 0415) Pulse Rate:  [57-61] 61 (11/02 0415) Resp:  [18] 18 (11/02 0415) BP: (89-113)/(42-63) 97/48 (11/02 0415) SpO2:  [92 %-97 %] 95 % (11/02 0415)  Intake/Output from previous day: 11/01 0701 - 11/02 0700 In: 480 [P.O.:480] Out: 200 [Drains:200] Intake/Output this shift: No intake/output data recorded.  Physical Exam: Strength full.  Dressing CDI.  Lab Results: No results for input(s): WBC, HGB, HCT, PLT in the last 72 hours. BMET No results for input(s): NA, K, CL, CO2, GLUCOSE, BUN, CREATININE, CALCIUM in the last 72 hours.  Studies/Results: Dg Thoracolumabar Spine  Result Date: 02/15/2018 CLINICAL DATA:  Decompression and fusion of the thoracic spine, T9, to the lumbar spine, L2. EXAM: DG C-ARM 61-120 MIN; THORACOLUMBAR SPINE - 2 VIEW COMPARISON:  Lumbar MRI, 09/28/2017 FINDINGS: Portable operative imaging demonstrates placement of pedicle screws along the mid to lower thoracic spine to the L2 level. Screws appear well-seated and well-positioned. IMPRESSION: Imaging provided for thoracolumbar posterior fusion. Visualized orthopedic hardware appears well positioned. Electronically Signed   By: Lajean Manes M.D.   On: 02/15/2018 13:55   Dg C-arm 1-60 Min  Result Date: 02/15/2018 CLINICAL DATA:  Decompression and fusion of the thoracic spine, T9, to the lumbar spine, L2. EXAM: DG C-ARM 61-120 MIN; THORACOLUMBAR SPINE - 2 VIEW COMPARISON:  Lumbar MRI, 09/28/2017 FINDINGS: Portable operative imaging demonstrates placement of pedicle screws along the mid to lower thoracic spine to the L2 level. Screws appear well-seated and well-positioned. IMPRESSION: Imaging provided for thoracolumbar posterior fusion. Visualized orthopedic hardware appears well positioned. Electronically Signed   By: Lajean Manes M.D.   On: 02/15/2018 13:55     Assessment/Plan: Discontinue drain.  Discharge home.    LOS: 2 days    Peggyann Shoals, MD 02/17/2018, 6:42 AM

## 2018-02-17 NOTE — Progress Notes (Signed)
Occupational Therapy Treatment Patient Details Name: Vanessa Harmon MRN: [id number] DOB: 1950-06-07 Today's Date: 02/17/2018    History of present illness pt is a 66 y/o female with pmh significant for PSVT, HTN, admitted with radicular pain due to stenosis, thoracolumbar scoliosis and spondyloslisthesis, pt s/p decompression and fusion T9-L2 with pedical scress and PLA with grafting.   OT comments  Pt completed OT session today and dressed for d/c. Rn notified of pending family arrival for discharge. Pt is at adequate level from OT standpoint to d/c home. Pt with review of adls with precautions with min cues.    Follow Up Recommendations  No OT follow up;Supervision/Assistance - 24 hour    Equipment Recommendations  None recommended by OT    Recommendations for Other Services      Precautions / Restrictions Precautions Precautions: Back Precaution Comments: reviewed precautiosn with adls Required Braces or Orthoses: Spinal Brace Spinal Brace: Thoracolumbosacral orthotic;Applied in sitting position       Mobility Bed Mobility Overal bed mobility: Needs Assistance Bed Mobility: Rolling;Supine to Sit;Sit to Supine Rolling: Modified independent (Device/Increase time)   Supine to sit: Modified independent (Device/Increase time) Sit to supine: Min assist   General bed mobility comments: pt able to progress to eob but requires min (A) for return to supine. pt educated on use of belt to help (A) R Le onto bed surface. pt nearly able to pull R LE onto bed without (A). pt ultimately requires (A) thus min (A)  Transfers Overall transfer level: Needs assistance Equipment used: Rolling walker (2 wheeled) Transfers: Sit to/from Stand Sit to Stand: Supervision         General transfer comment: cues for safe hand placement    Balance Overall balance assessment: No apparent balance deficits (not formally assessed)                                         ADL  either performed or assessed with clinical judgement   ADL Overall ADL's : Needs assistance/impaired Eating/Feeding: Independent   Grooming: Independent       Lower Body Bathing: Minimal assistance   Upper Body Dressing : Min guard Upper Body Dressing Details (indicate cue type and reason): pt able to don brace and educated to leave one side clipped to make don doff easier. pt with poor recall and attempting to unclip. pt states "oh yes you said that" pt able to dress UB. pt insiste on bra don initially and then after don decided to remove. pt prefers cami top then brace then shirt over brace itself.  Lower Body Dressing: Minimal assistance Lower Body Dressing Details (indicate cue type and reason): pt has AE at home and needed redirect x3 to dress R LE first due to decr knee flexion for figure 4 positioning. pt using reacher and sock aide.          Tub/ Shower Transfer: English as a second language teacher Details (indicate cue type and reason): pt educated to complete with spouse x2 times and use of warm water and avoiding steam. pt expressed understanding Functional mobility during ADLs: Supervision/safety;Rolling walker General ADL Comments: pt able to complete dressing at EOB with elevated surface to simulate home setup. pt has all AE at home used today     Vision       Perception     Praxis      Cognition Arousal/Alertness: Awake/alert Behavior During  Therapy: WFL for tasks assessed/performed Overall Cognitive Status: Impaired/Different from baseline                                 General Comments: pt requires redirection for precautions with adls. pt able to verbalize but with poor return demo during functional task         Exercises     Shoulder Instructions       General Comments pt with dressing intact and a few areas of small specs of blood dried on dressing. overall dressing appears in excellent condition    Pertinent Vitals/ Pain        Pain Assessment: Faces Faces Pain Scale: Hurts little more Pain Location: back Pain Descriptors / Indicators: Operative site guarding Pain Intervention(s): Monitored during session;Premedicated before session;Repositioned  Home Living                                          Prior Functioning/Environment              Frequency  Min 2X/week        Progress Toward Goals  OT Goals(current goals can now be found in the care plan section)  Progress towards OT goals: Progressing toward goals  Acute Rehab OT Goals Patient Stated Goal: to return home today OT Goal Formulation: With patient Time For Goal Achievement: 03/02/18 Potential to Achieve Goals: Good ADL Goals Pt Will Perform Upper Body Dressing: with modified independence;sitting Pt Will Perform Lower Body Dressing: with supervision;sit to/from stand;with adaptive equipment Pt Will Perform Toileting - Clothing Manipulation and hygiene: with modified independence;sit to/from stand Pt Will Perform Tub/Shower Transfer: Tub transfer;with supervision;ambulating;shower seat;rolling walker  Plan Discharge plan remains appropriate    Co-evaluation                 AM-PAC PT "6 Clicks" Daily Activity     Outcome Measure   Help from another person eating meals?: None Help from another person taking care of personal grooming?: None Help from another person toileting, which includes using toliet, bedpan, or urinal?: A Little Help from another person bathing (including washing, rinsing, drying)?: A Little Help from another person to put on and taking off regular upper body clothing?: None Help from another person to put on and taking off regular lower body clothing?: A Little 6 Click Score: 21    End of Session Equipment Utilized During Treatment: Rolling walker;Back brace  OT Visit Diagnosis: Other abnormalities of gait and mobility (R26.89)   Activity Tolerance Patient tolerated treatment well    Patient Left in bed;with call bell/phone within reach   Nurse Communication Mobility status;Precautions        Time: 8088-1103 OT Time Calculation (min): 24 min  Charges: OT General Charges $OT Visit: 1 Visit OT Treatments $Self Care/Home Management : 23-37 mins   Jeri Modena, OTR/L  Acute Rehabilitation Services Pager: [phone] Office: [phone] .    Parke Poisson B 02/17/2018, 10:07 AM

## 2018-02-17 NOTE — Progress Notes (Signed)
Physical Therapy Treatment Patient Details Name: Vanessa Harmon MRN: [id number] DOB: 04/12/1951 Today's Date: 02/17/2018    History of Present Illness pt is a 67 y/o female with pmh significant for PSVT, HTN, admitted with radicular pain due to stenosis, thoracolumbar scoliosis and spondyloslisthesis, pt s/p decompression and fusion T9-L2 with pedical scress and PLA with grafting.    PT Comments    Patient seen for mobility progression. Upon arrival, pt was ambulating out of bathroom without TLSO and pt educated on use of brace for mobility. Pt tolerated gait and stair training well and able to increase gait distance. Pt able to recall 1/3 precautions and all precautions reviewed with pt. Continue to progress as tolerated.    Follow Up Recommendations  No PT follow up;Supervision/Assistance - 24 hour     Equipment Recommendations  None recommended by PT    Recommendations for Other Services       Precautions / Restrictions Precautions Precautions: Back Precaution Comments: reviewed precautions with pt; pt able to recall 1/3 precautions  Required Braces or Orthoses: Spinal Brace Spinal Brace: Thoracolumbosacral orthotic;Applied in sitting position    Mobility  Bed Mobility               General bed mobility comments: pt in ambulating out of bathroom upon arrival without TLSO; educated on use of TLSO for mobility; verbally reviewed log roll technique   Transfers Overall transfer level: Needs assistance   Transfers: Sit to/from Stand Sit to Stand: Supervision         General transfer comment: cues for safe hand placement  Ambulation/Gait Ambulation/Gait assistance: Supervision Gait Distance (Feet): 250 Feet Assistive device: Rolling walker (2 wheeled) Gait Pattern/deviations: Step-through pattern Gait velocity: decreased   General Gait Details: cues for posture and stride length; safe use of AD    Stairs Stairs: Yes Stairs assistance: Min guard Stair  Management: Two rails;Step to pattern;Forwards Number of Stairs: 5 General stair comments: cues for sequencing and technique   Wheelchair Mobility    Modified Rankin (Stroke Patients Only)       Balance Overall balance assessment: No apparent balance deficits (not formally assessed)                                          Cognition Arousal/Alertness: Awake/alert Behavior During Therapy: WFL for tasks assessed/performed Overall Cognitive Status: Within Functional Limits for tasks assessed                                        Exercises      General Comments        Pertinent Vitals/Pain Pain Assessment: Faces Faces Pain Scale: Hurts little more Pain Location: back and R LE Pain Descriptors / Indicators: Guarding;Sore Pain Intervention(s): Monitored during session;Repositioned    Home Living                      Prior Function            PT Goals (current goals can now be found in the care plan section) Progress towards PT goals: Progressing toward goals    Frequency    Min 5X/week      PT Plan Current plan remains appropriate    Co-evaluation  AM-PAC PT "6 Clicks" Daily Activity  Outcome Measure  Difficulty turning over in bed (including adjusting bedclothes, sheets and blankets)?: None Difficulty moving from lying on back to sitting on the side of the bed? : None Difficulty sitting down on and standing up from a chair with arms (e.g., wheelchair, bedside commode, etc,.)?: A Little Help needed moving to and from a bed to chair (including a wheelchair)?: A Little Help needed walking in hospital room?: A Little Help needed climbing 3-5 steps with a railing? : A Little 6 Click Score: 20    End of Session Equipment Utilized During Treatment: Back brace;Gait belt Activity Tolerance: Patient tolerated treatment well Patient left: in bed;with call bell/phone within reach;Other (comment)(pt  sitting EOB and eating breakfast) Nurse Communication: Mobility status PT Visit Diagnosis: Other abnormalities of gait and mobility (R26.89);Pain Pain - part of body: (back)     Time: 0750-0800 PT Time Calculation (min) (ACUTE ONLY): 10 min  Charges:  $Gait Training: 8-22 mins                     Earney Navy, PTA Acute Rehabilitation Services Pager: [phone] Office: [phone]     Darliss Cheney 02/17/2018, 8:56 AM

## 2018-02-17 NOTE — Discharge Summary (Signed)
Physician Discharge Summary  Patient ID: Vanessa Harmon MRN: [id number] DOB/AGE: 1950/09/08 67 y.o.  Admit date: 02/15/2018 Discharge date: 02/17/2018  Admission Diagnoses:Spinal stenosis, Thoracolumbar region, thoracolumbar scoliosis, spondylolisthesis, radiculopathy, lumbago  Discharge Diagnoses: Spinal stenosis, Thoracolumbar region, thoracolumbar scoliosis, spondylolisthesis, radiculopathy, lumbago Active Problems:   Idiopathic scoliosis of thoracolumbar region   Discharged Condition: good  Hospital Course: Patient underwent: Decompression and fusion Thoracic nine to Lumbar two with exploration of previous fusion (N/A) with pedicle screw fixation and posterolateral arthrodesis with autograft, allograft, BMP She did well with surgery and mobilized well with PT.  She was discharged home on am of POD 2.  Consults: None  Significant Diagnostic Studies: None  Treatments: surgery: Decompression and fusion Thoracic nine to Lumbar two with exploration of previous fusion (N/A) with pedicle screw fixation and posterolateral arthrodesis with autograft, allograft, BMP  Discharge Exam: Blood pressure (!) 97/48, pulse 61, temperature 98.6 F (37 C), temperature source Oral, resp. rate 18, height 5' 6.5" (1.689 m), weight 73.5 kg, SpO2 95 %. Neurologic: Alert and oriented X 3, normal strength and tone. Normal symmetric reflexes. Normal coordination and gait Wound:CDI  Disposition: Home  Discharge Instructions    Diet - low sodium heart healthy   Complete by:  As directed    Increase activity slowly   Complete by:  As directed      Allergies as of 02/17/2018      Reactions   Breo Ellipta [fluticasone Furoate-vilanterol] Other (See Comments)   Severe hoarseness    Levaquin [levofloxacin] Other (See Comments)   Lightheadedness, not feeling well, ear pain   Spiriva Handihaler [tiotropium Bromide Monohydrate]    UNSPECIFIED REACTION    Tape Rash   PAPER TAPE: Causes severe rash       Medication List    TAKE these medications   amitriptyline 10 MG tablet Commonly known as:  ELAVIL TAKE 1 TABLET (10 MG TOTAL) BY MOUTH AT BEDTIME.   aspirin 81 MG tablet Take 81 mg by mouth daily.   atorvastatin 80 MG tablet Commonly known as:  LIPITOR TAKE 1/2 BY MOUTH ONCE DAILY What changed:    how much to take  how to take this  when to take this  additional instructions   Azelastine HCl 137 MCG/SPRAY Soln Place 2 sprays into both nostrils 2 (two) times daily. What changed:  how much to take   carboxymethylcellulose 0.5 % Soln Commonly known as:  REFRESH PLUS 1 drop 3 (three) times daily as needed.   cetirizine 10 MG tablet Commonly known as:  ZYRTEC Take 10 mg by mouth daily as needed for allergies.   chlorthalidone 25 MG tablet Commonly known as:  HYGROTON Take 12.5 mg by mouth daily.   dextromethorphan-guaiFENesin 30-600 MG 12hr tablet Commonly known as:  MUCINEX DM Take 1 tablet by mouth daily as needed for cough.   escitalopram 20 MG tablet Commonly known as:  LEXAPRO TAKE 1 TABLET BY MOUTH DAILY   fish oil-omega-3 fatty acids 1000 MG capsule Take 1 g by mouth daily.   fluticasone 50 MCG/ACT nasal spray Commonly known as:  FLONASE Place 1 spray into both nostrils 2 (two) times daily.   folic acid 1 MG tablet Commonly known as:  FOLVITE Take 3 mg by mouth daily.   furosemide 20 MG tablet Commonly known as:  LASIX TAKE 1 TABLET (20 MG TOTAL) BY MOUTH DAILY. What changed:  how much to take   HYDROcodone-acetaminophen 5-325 MG tablet Commonly known as:  NORCO/VICODIN Take 1  tablet by mouth every 6 (six) hours as needed for moderate pain.   indomethacin 75 MG CR capsule Commonly known as:  INDOCIN SR TAKE 1 CAPSULE BY MOUTH ONCE DAILY   irbesartan 300 MG tablet Commonly known as:  AVAPRO Take 300 mg by mouth daily.   methocarbamol 500 MG tablet Commonly known as:  ROBAXIN Take 1 tablet (500 mg total) by mouth every 6 (six) hours as  needed for muscle spasms.   methotrexate 25 MG/ML injection Inject 15 mg into the skin once a week. 15 mg=0.8 ml  Every Wednesday   montelukast 10 MG tablet Commonly known as:  SINGULAIR Take 1 tablet (10 mg total) by mouth at bedtime.   multivitamin capsule Take 1 capsule by mouth daily.   omeprazole 40 MG capsule Commonly known as:  PRILOSEC TAKE ONE CAPSULE BY MOUTH BEFORE BREAKFAST DAILY   oxyCODONE 5 MG immediate release tablet Commonly known as:  Oxy IR/ROXICODONE Take 1-2 tablets (5-10 mg total) by mouth every 4 (four) hours as needed for moderate pain ((score 4 to 6)).   polyethylene glycol packet Commonly known as:  MIRALAX / GLYCOLAX Take 17 g by mouth daily.   predniSONE 5 MG tablet Commonly known as:  DELTASONE Take 5 mg by mouth daily with breakfast.   PROAIR HFA 108 (90 Base) MCG/ACT inhaler Generic drug:  albuterol INHALE TWO PUFFS INTO THE LUNGS EVERY 4 HOURS AS NEEDED FOR WHEEZING OR SHORTNESS OF BREATH. What changed:  See the new instructions.   pseudoephedrine 30 MG tablet Commonly known as:  SUDAFED Take 30 mg by mouth daily as needed for congestion.   ranitidine 300 MG tablet Commonly known as:  ZANTAC TAKE ONE TABLET BY MOUTH AT BEDTIME DAILY   traZODone 50 MG tablet Commonly known as:  DESYREL Take 100 mg by mouth at bedtime.   valACYclovir 1000 MG tablet Commonly known as:  VALTREX Take 1,000 mg by mouth daily.   vitamin C 500 MG tablet Commonly known as:  ASCORBIC ACID Take 500 mg by mouth daily.   Vitamin D3 5000 units Caps Take 5,000 Units by mouth daily.        Signed: Peggyann Shoals, MD 02/17/2018, 6:43 AM

## 2018-02-20 ENCOUNTER — Ambulatory Visit: Payer: Medicare Other | Admitting: Allergy and Immunology

## 2018-02-28 ENCOUNTER — Ambulatory Visit (INDEPENDENT_AMBULATORY_CARE_PROVIDER_SITE_OTHER): Payer: Medicare Other

## 2018-02-28 DIAGNOSIS — J309 Allergic rhinitis, unspecified: Secondary | ICD-10-CM

## 2018-03-07 ENCOUNTER — Ambulatory Visit (INDEPENDENT_AMBULATORY_CARE_PROVIDER_SITE_OTHER): Payer: Medicare Other | Admitting: *Deleted

## 2018-03-07 DIAGNOSIS — J309 Allergic rhinitis, unspecified: Secondary | ICD-10-CM

## 2018-03-13 ENCOUNTER — Other Ambulatory Visit: Payer: Self-pay | Admitting: *Deleted

## 2018-03-13 MED ORDER — MONTELUKAST SODIUM 10 MG PO TABS: 10.0000 mg | ORAL_TABLET | Freq: Every day | ORAL | 1 refills | Status: DC

## 2018-03-21 ENCOUNTER — Ambulatory Visit (INDEPENDENT_AMBULATORY_CARE_PROVIDER_SITE_OTHER): Payer: Medicare Other | Admitting: *Deleted

## 2018-03-21 DIAGNOSIS — J309 Allergic rhinitis, unspecified: Secondary | ICD-10-CM

## 2018-03-26 ENCOUNTER — Other Ambulatory Visit: Payer: Self-pay | Admitting: *Deleted

## 2018-03-26 DIAGNOSIS — J4540 Moderate persistent asthma, uncomplicated: Secondary | ICD-10-CM

## 2018-03-26 MED ORDER — ALBUTEROL SULFATE HFA 108 (90 BASE) MCG/ACT IN AERS: INHALATION_SPRAY | RESPIRATORY_TRACT | 1 refills | Status: AC

## 2018-03-27 NOTE — Progress Notes (Signed)
Vials exp 03-28-19

## 2018-04-02 DIAGNOSIS — J3081 Allergic rhinitis due to animal (cat) (dog) hair and dander: Secondary | ICD-10-CM

## 2018-04-03 DIAGNOSIS — J3089 Other allergic rhinitis: Secondary | ICD-10-CM | POA: Diagnosis not present

## 2018-04-04 ENCOUNTER — Ambulatory Visit (INDEPENDENT_AMBULATORY_CARE_PROVIDER_SITE_OTHER): Payer: Medicare Other | Admitting: *Deleted

## 2018-04-04 DIAGNOSIS — J309 Allergic rhinitis, unspecified: Secondary | ICD-10-CM | POA: Diagnosis not present

## 2018-04-13 ENCOUNTER — Telehealth: Payer: Self-pay

## 2018-04-13 MED ORDER — OMEPRAZOLE 40 MG PO CPDR: DELAYED_RELEASE_CAPSULE | ORAL | 1 refills | Status: DC

## 2018-04-13 NOTE — Telephone Encounter (Signed)
Refills sent in

## 2018-04-13 NOTE — Telephone Encounter (Signed)
  Patient is calling requesting a refill on Omeprazole. She states the pharmacy sent a request.   [place]

## 2018-04-20 ENCOUNTER — Other Ambulatory Visit: Payer: Self-pay | Admitting: Allergy

## 2018-04-24 ENCOUNTER — Encounter: Payer: Self-pay | Admitting: Allergy and Immunology

## 2018-04-24 ENCOUNTER — Ambulatory Visit: Payer: Medicare Other | Admitting: Allergy and Immunology

## 2018-04-24 ENCOUNTER — Ambulatory Visit: Payer: Self-pay | Admitting: *Deleted

## 2018-04-24 VITALS — BP 102/64 | HR 64 | Resp 18

## 2018-04-24 DIAGNOSIS — K219 Gastro-esophageal reflux disease without esophagitis: Secondary | ICD-10-CM

## 2018-04-24 DIAGNOSIS — J4540 Moderate persistent asthma, uncomplicated: Secondary | ICD-10-CM | POA: Diagnosis not present

## 2018-04-24 DIAGNOSIS — J3089 Other allergic rhinitis: Secondary | ICD-10-CM

## 2018-04-24 DIAGNOSIS — J309 Allergic rhinitis, unspecified: Secondary | ICD-10-CM

## 2018-04-24 NOTE — Patient Instructions (Addendum)
  1. Continue immunotherapy and Epi-Pen  2.  Continue to treat inflammation:   A. nasal fluticasone one spray each nostril twice a day  B. Nasal azelastine one spray each nostril one time per day  C. Singulair 10mg  tablet one time per day   3.  Continue to treat reflux:   A. consolidate all caffeine and chocolate consumption  B. omeprazole 40 mg one tablet in AM  C. ranitidine 300 mg one tablet in PM  4. If needed:   A. ProAir HFA 2 puffs every 4-6 hours  B. OTC antihistamine  C. Nasal saline  6.  Return to clinic in 12 months or earlier if problem

## 2018-04-24 NOTE — Progress Notes (Signed)
Follow-up Note  Referring Provider: Elby Showers, MD Primary Provider: Juanell Fairly, MD Date of Office Visit: 04/24/2018  Subjective:   Vanessa Harmon (DOB: 13-Mar-1951) is a 68 y.o. female who returns to the [facility] on 04/24/2018 in re-evaluation of the following:  HPI: Deoni returns to this clinic in reevaluation of her asthma and allergic rhinitis and LPR.  I last saw her in this clinic 18 October 2017.  She has continued to use immunotherapy currently at every 2 weeks without any adverse effect.  She has very little airway symptoms at this point in time.  She has not required a systemic steroid or a antibiotic for any type of respiratory tract issue during the interval.  She rarely uses any short acting bronchodilator and can exert herself without any problem.  She continues to use nasal fluticasone and nasal antihistamine and montelukast on a consistent basis.  Her reflux has been under excellent control and she has had very little problems with her throat.  She continues on a combination of a proton pump inhibitor and H2 receptor blocker.  Her current dose of systemic steroid is prednisone 5 mg daily utilized in the treatment of her polymyalgia rheumatica.  She has scheduled an appointment with her rheumatologist to discuss other options for therapy.  Allergies as of 04/24/2018      Reactions   Breo Ellipta [fluticasone Furoate-vilanterol] Other (See Comments)   Severe hoarseness    Levaquin [levofloxacin] Other (See Comments)   Lightheadedness, not feeling well, ear pain   Spiriva Handihaler [tiotropium Bromide Monohydrate]    UNSPECIFIED REACTION    Tape Rash   PAPER TAPE: Causes severe rash      Medication List      albuterol 108 (90 Base) MCG/ACT inhaler Commonly known as:  PROAIR HFA Inhale two puffs every 4-6 hours if needed for cough or wheeze.   amitriptyline 10 MG tablet Commonly known as:  ELAVIL TAKE 1 TABLET (10 MG TOTAL) BY MOUTH AT  BEDTIME.   aspirin 81 MG tablet Take 81 mg by mouth daily.   atorvastatin 80 MG tablet Commonly known as:  LIPITOR TAKE 1/2 BY MOUTH ONCE DAILY   Azelastine HCl 137 MCG/SPRAY Soln Place 2 sprays into both nostrils 2 (two) times daily.   carboxymethylcellulose 0.5 % Soln Commonly known as:  REFRESH PLUS 1 drop 3 (three) times daily as needed.   cetirizine 10 MG tablet Commonly known as:  ZYRTEC Take 10 mg by mouth daily as needed for allergies.   chlorthalidone 25 MG tablet Commonly known as:  HYGROTON Take 12.5 mg by mouth daily.   dextromethorphan-guaiFENesin 30-600 MG 12hr tablet Commonly known as:  MUCINEX DM Take 1 tablet by mouth daily as needed for cough.   escitalopram 20 MG tablet Commonly known as:  LEXAPRO TAKE 1 TABLET BY MOUTH DAILY   fish oil-omega-3 fatty acids 1000 MG capsule Take 1 g by mouth daily.   fluticasone 50 MCG/ACT nasal spray Commonly known as:  FLONASE Place 1 spray into both nostrils 2 (two) times daily.   furosemide 20 MG tablet Commonly known as:  LASIX TAKE 1 TABLET (20 MG TOTAL) BY MOUTH DAILY.   indomethacin 75 MG CR capsule Commonly known as:  INDOCIN SR TAKE 1 CAPSULE BY MOUTH ONCE DAILY   irbesartan 300 MG tablet Commonly known as:  AVAPRO Take 300 mg by mouth daily.   montelukast 10 MG tablet Commonly known as:  SINGULAIR Take 1  tablet (10 mg total) by mouth at bedtime.   multivitamin capsule Take 1 capsule by mouth daily.   omeprazole 40 MG capsule Commonly known as:  PRILOSEC TAKE ONE CAPSULE BY MOUTH BEFORE BREAKFAST DAILY   polyethylene glycol packet Commonly known as:  MIRALAX / GLYCOLAX Take 17 g by mouth daily.   predniSONE 5 MG tablet Commonly known as:  DELTASONE Take 5 mg by mouth daily with breakfast.   pseudoephedrine 30 MG tablet Commonly known as:  SUDAFED Take 30 mg by mouth daily as needed for congestion.   ranitidine 300 MG tablet Commonly known as:  ZANTAC TAKE ONE TABLET BY MOUTH AT  BEDTIME DAILY   traZODone 50 MG tablet Commonly known as:  DESYREL Take 100 mg by mouth at bedtime.   valACYclovir 1000 MG tablet Commonly known as:  VALTREX Take 1,000 mg by mouth daily.   vitamin C 500 MG tablet Commonly known as:  ASCORBIC ACID Take 500 mg by mouth daily.   Vitamin D3 125 MCG (5000 UT) Caps Take 5,000 Units by mouth daily.       Past Medical History:  Diagnosis Date  . Adenomatous polyp   . Anxiety    takes Ativan daily as needed  . Arthritis    inflammatory arthritis (Dr. Dagoberto Ligas)  . Asthma   . Chronic back pain    scoliosis   . Constipation    takes miralax every other day  . Depression    takes Lexapro daily  . Dysrhythmia   . Eczema   . Essential hypertension, benign    takes Hyzaar and Metoprolol daily  . GERD (gastroesophageal reflux disease)   . History of blood transfusion   . History of bronchitis 5 rys ago  . History of colon polyps   . History of shingles   . Hyperlipidemia    takes Atorvastatin daily  . Insomnia    takes Melatonin nightly  . Joint pain   . Pneumonia as a child  . PSVT (paroxysmal supraventricular tachycardia) ([place])   . Recurrent upper respiratory infection (URI)   . Rhinitis, allergic    uses Flonase daily  . Urinary urgency   . Urticaria     Past Surgical History:  Procedure Laterality Date  . ADENOIDECTOMY    . APPENDECTOMY    . BACK SURGERY  2012   at age 78 d/t scoliosis  . BLEPHAROPLASTY  2018  . BREAST BIOPSY    . cataract surgery    . COLONOSCOPY    . KNEE ARTHROSCOPY Bilateral   . neuroma removed from foot     unsure of which foot  . POSTERIOR LUMBAR FUSION 4 LEVEL N/A 02/15/2018   Procedure: Decompression and fusion Thoracic nine to Lumbar two with exploration of previous fusion;  Surgeon: Erline Levine, MD;  Location: [facility];  Service: Neurosurgery;  Laterality: N/A;  . SHOULDER SURGERY Right   . TONSILLECTOMY    . TOTAL HIP ARTHROPLASTY Right 04/15/2014   Procedure: RIGHT  TOTAL HIP ARTHROPLASTY ANTERIOR APPROACH;  Surgeon: Renette Butters, MD;  Location: [facility];  Service: Orthopedics;  Laterality: Right;    Review of systems negative except as noted in HPI / PMHx or noted below:  Review of Systems  Constitutional: Negative.   HENT: Negative.   Eyes: Negative.   Respiratory: Negative.   Cardiovascular: Negative.   Gastrointestinal: Negative.   Genitourinary: Negative.   Musculoskeletal: Negative.   Skin: Negative.   Neurological: Negative.   Endo/Heme/Allergies:  Negative.   Psychiatric/Behavioral: Negative.      Objective:   Vitals:   04/24/18 1114  BP: 102/64  Pulse: 64  Resp: 18  SpO2: 98%          Physical Exam Constitutional:      Appearance: She is not diaphoretic.  HENT:     Head: Normocephalic.     Right Ear: Tympanic membrane, ear canal and external ear normal.     Left Ear: Tympanic membrane, ear canal and external ear normal.     Nose: Nose normal. No mucosal edema or rhinorrhea.     Mouth/Throat:     Pharynx: Uvula midline. No oropharyngeal exudate.  Eyes:     Conjunctiva/sclera: Conjunctivae normal.  Neck:     Thyroid: No thyromegaly.     Trachea: Trachea normal. No tracheal tenderness or tracheal deviation.  Cardiovascular:     Rate and Rhythm: Normal rate and regular rhythm.     Heart sounds: Normal heart sounds, S1 normal and S2 normal. No murmur.  Pulmonary:     Effort: No respiratory distress.     Breath sounds: Normal breath sounds. No stridor. No wheezing or rales.  Lymphadenopathy:     Head:     Right side of head: No tonsillar adenopathy.     Left side of head: No tonsillar adenopathy.     Cervical: No cervical adenopathy.  Skin:    Findings: No erythema or rash.     Nails: There is no clubbing.   Neurological:     Mental Status: She is alert.     Diagnostics:    Spirometry was performed and demonstrated an FEV1 of 2.46 at 97 % of predicted.  The patient had an Asthma Control Test with the  following results: ACT Total Score: 24.    Assessment and Plan:   1. Asthma, moderate persistent, well-controlled   2. Other allergic rhinitis   3. LPRD (laryngopharyngeal reflux disease)       1. Continue immunotherapy and Epi-Pen  2.  Continue to treat inflammation:   A. nasal fluticasone one spray each nostril twice a day  B. Nasal azelastine one spray each nostril one time per day  C. Singulair 10mg  tablet one time per day   3.  Continue to treat reflux:   A. consolidate all caffeine and chocolate consumption  B. omeprazole 40 mg one tablet in AM  C. ranitidine 300 mg one tablet in PM  4. If needed:   A. ProAir HFA 2 puffs every 4-6 hours  B. OTC antihistamine  C. Nasal saline  6.  Return to clinic in 12 months or earlier if problem  Overall Jenell has really done very well on her current plan of therapy which includes immunotherapy and anti-inflammatory agents for her airway and therapy directed against reflux.  She will remain on this plan and I will see her back in this clinic in 12 months or earlier if there is a problem.  Allena Katz, MD [facility]

## 2018-04-25 ENCOUNTER — Encounter: Payer: Self-pay | Admitting: Allergy and Immunology

## 2018-04-27 ENCOUNTER — Other Ambulatory Visit: Payer: Self-pay | Admitting: Internal Medicine

## 2018-04-27 NOTE — Telephone Encounter (Signed)
Please call pharmacy. I think she has changed to another physician in [facility]. Please ask them to check with pt and do not approve this refill

## 2018-05-09 ENCOUNTER — Ambulatory Visit (INDEPENDENT_AMBULATORY_CARE_PROVIDER_SITE_OTHER): Payer: Medicare Other

## 2018-05-09 DIAGNOSIS — J309 Allergic rhinitis, unspecified: Secondary | ICD-10-CM

## 2018-05-12 ENCOUNTER — Other Ambulatory Visit: Payer: Self-pay | Admitting: Allergy and Immunology

## 2018-05-16 ENCOUNTER — Telehealth: Payer: Self-pay | Admitting: *Deleted

## 2018-05-16 ENCOUNTER — Ambulatory Visit (INDEPENDENT_AMBULATORY_CARE_PROVIDER_SITE_OTHER): Payer: Medicare Other | Admitting: *Deleted

## 2018-05-16 DIAGNOSIS — J309 Allergic rhinitis, unspecified: Secondary | ICD-10-CM | POA: Diagnosis not present

## 2018-05-16 NOTE — Telephone Encounter (Signed)
Please make sure she is using this plan:   Continue to treat inflammation:              A. nasal fluticasone one spray each nostril twice a day             B. Nasal azelastine one spray each nostril one time per day             C. Singulair 10mg  tablet one time per day  And she can add in lots of nasal saline and cetirizine 10mg  1-2 times per day. If still with problems, and it does appear that she has contracted a viral upper respiratory infection, which can take 3 weeks to clear, then we need to see her in clinic.

## 2018-05-16 NOTE — Telephone Encounter (Signed)
Patient came in to get her allergy injection today and stated that her allergies have been giving her issues this month. She states that she has been having nasal congestion and runny nose and has been clearing her throat frequently. She states that in the evening her head feels "foggy" and slightly "spinning". She states that she has tried Zyrtec, Mucinex, and Sudafed and none have been helping. Please advise.

## 2018-05-16 NOTE — Telephone Encounter (Signed)
Information discussed with pt. Pt verbalized understanding. FU Appointment scheduled.

## 2018-05-22 ENCOUNTER — Ambulatory Visit (INDEPENDENT_AMBULATORY_CARE_PROVIDER_SITE_OTHER): Payer: Medicare Other

## 2018-05-22 DIAGNOSIS — J309 Allergic rhinitis, unspecified: Secondary | ICD-10-CM

## 2018-05-28 ENCOUNTER — Other Ambulatory Visit: Payer: Self-pay | Admitting: Internal Medicine

## 2018-05-29 ENCOUNTER — Ambulatory Visit (INDEPENDENT_AMBULATORY_CARE_PROVIDER_SITE_OTHER): Payer: Medicare Other | Admitting: *Deleted

## 2018-05-29 DIAGNOSIS — J309 Allergic rhinitis, unspecified: Secondary | ICD-10-CM | POA: Diagnosis not present

## 2018-06-01 ENCOUNTER — Other Ambulatory Visit: Payer: Self-pay | Admitting: *Deleted

## 2018-06-01 MED ORDER — OMEPRAZOLE 40 MG PO CPDR: DELAYED_RELEASE_CAPSULE | ORAL | 10 refills | Status: DC

## 2018-06-01 MED ORDER — FLUTICASONE PROPIONATE 50 MCG/ACT NA SUSP: NASAL | 10 refills | Status: DC

## 2018-06-02 ENCOUNTER — Other Ambulatory Visit: Payer: Self-pay | Admitting: Allergy and Immunology

## 2018-06-05 ENCOUNTER — Ambulatory Visit (INDEPENDENT_AMBULATORY_CARE_PROVIDER_SITE_OTHER): Payer: Medicare Other | Admitting: *Deleted

## 2018-06-05 DIAGNOSIS — J309 Allergic rhinitis, unspecified: Secondary | ICD-10-CM

## 2018-06-19 ENCOUNTER — Ambulatory Visit: Payer: Medicare Other | Admitting: Allergy and Immunology

## 2018-06-19 ENCOUNTER — Other Ambulatory Visit: Payer: Self-pay

## 2018-06-19 VITALS — BP 128/62 | HR 70 | Resp 16

## 2018-06-19 DIAGNOSIS — K219 Gastro-esophageal reflux disease without esophagitis: Secondary | ICD-10-CM

## 2018-06-19 DIAGNOSIS — J4530 Mild persistent asthma, uncomplicated: Secondary | ICD-10-CM | POA: Diagnosis not present

## 2018-06-19 DIAGNOSIS — J3089 Other allergic rhinitis: Secondary | ICD-10-CM | POA: Diagnosis not present

## 2018-06-19 MED ORDER — AZELASTINE HCL 137 MCG/SPRAY NA SOLN: 1.0000 | Freq: Two times a day (BID) | NASAL | 5 refills | Status: DC

## 2018-06-19 MED ORDER — RANITIDINE HCL 300 MG PO TABS: 300.0000 mg | ORAL_TABLET | Freq: Every day | ORAL | 5 refills | Status: DC

## 2018-06-19 MED ORDER — MONTELUKAST SODIUM 10 MG PO TABS: 10.0000 mg | ORAL_TABLET | Freq: Every day | ORAL | 5 refills | Status: AC

## 2018-06-19 MED ORDER — OMEPRAZOLE 40 MG PO CPDR: DELAYED_RELEASE_CAPSULE | ORAL | 5 refills | Status: DC

## 2018-06-19 MED ORDER — FLUTICASONE PROPIONATE 50 MCG/ACT NA SUSP: NASAL | 5 refills | Status: DC

## 2018-06-19 NOTE — Patient Instructions (Addendum)
  1. Continue immunotherapy and Epi-Pen  2.  Continue to treat inflammation:   A. nasal fluticasone one spray each nostril twice a day  B. Nasal azelastine one spray each nostril one time per day  C. Singulair 10mg  tablet one time per day   3.  Continue to treat reflux:   A. consolidate all caffeine and chocolate consumption  B. omeprazole 40 mg one tablet in AM  C. ranitidine 300 mg one tablet in PM  4. If needed:   A. ProAir HFA 2 puffs every 4-6 hours  B. OTC antihistamine  C. Nasal saline  5.  For this recent episode can utilize the following:   A.  Discontinue pseudoephedrine  B.  Increase cetirizine to twice a day  C.  Mucinex twice a day  6.  Contact clinic next week with an update  7.  Return to clinic in 12 months or earlier if problem

## 2018-06-19 NOTE — Progress Notes (Signed)
[facility] [facility]   Follow-up Note  Referring Provider: Juanell Fairly, MD Primary Provider: Juanell Fairly, MD Date of Office Visit: 06/19/2018  Subjective:   Vanessa Harmon (DOB: April 11, 1951) is a 68 y.o. female who returns to [facility] on 06/19/2018 in re-evaluation of the following:  HPI: Veneda presents to this clinic in evaluation of an event that occurred over the course of the past 2 weeks or so.  I last saw her in this clinic on 24 April 2018 for an issue with asthma and allergic rhinitis and LPR which was under very good control.  Vanessa Harmon states that sometimes toward the tail end of January she developed an upper respiratory tract infection that completely cleared but sometime in February she noticed that she was becoming a little bit unbalanced.  It is hard for Vanessa Harmon to really nail down what is her feeling but she describes things such as "bad" and some form of "internal feeling" and "not good".  She does not have any ugly nasal discharge or anosmia or fever or headache.  She does not have any increase in her chronic tinnitus and she does not have any vertigo and she does not have any hearing loss.  She does think that her voice is a little bit raspy though and she did have some transient left ear pain.  He has added Sudafed into her medical regime which she thinks may help somewhat.  She continues on immunotherapy every 2 weeks without any adverse effect.  She thinks that her reflux is under excellent control at this point in time.  She has not had any significant respiratory tract symptoms and does not use a short acting bronchodilator.  She continues to use a chronic dose of prednisone at 5 mg daily in the treatment of her polymyalgia rheumatica for which she will be discussing with her rheumatologist in near future about possibly tapering some of this medication.  Allergies as of 06/19/2018      Reactions   Breo Ellipta  [fluticasone Furoate-vilanterol] Other (See Comments)   Severe hoarseness    Levaquin [levofloxacin] Other (See Comments)   Lightheadedness, not feeling well, ear pain   Spiriva Handihaler [tiotropium Bromide Monohydrate]    UNSPECIFIED REACTION    Tape Rash   PAPER TAPE: Causes severe rash      Medication List      albuterol 108 (90 Base) MCG/ACT inhaler Commonly known as:  PROAIR HFA Inhale two puffs every 4-6 hours if needed for cough or wheeze.   amitriptyline 10 MG tablet Commonly known as:  ELAVIL TAKE 1 TABLET (10 MG TOTAL) BY MOUTH AT BEDTIME.   aspirin 81 MG tablet Take 81 mg by mouth daily.   atorvastatin 80 MG tablet Commonly known as:  LIPITOR TAKE 1/2 BY MOUTH ONCE DAILY   Azelastine HCl 137 MCG/SPRAY Soln Place 2 sprays into both nostrils 2 (two) times daily.   carboxymethylcellulose 0.5 % Soln Commonly known as:  REFRESH PLUS 1 drop 3 (three) times daily as needed.   cetirizine 10 MG tablet Commonly known as:  ZYRTEC Take 10 mg by mouth daily as needed for allergies.   chlorthalidone 25 MG tablet Commonly known as:  HYGROTON Take 12.5 mg by mouth daily.   dextromethorphan-guaiFENesin 30-600 MG 12hr tablet Commonly known as:  MUCINEX DM Take 1 tablet by mouth daily as needed for cough.   escitalopram 20 MG tablet Commonly known as:  LEXAPRO  TAKE 1 TABLET BY MOUTH DAILY   fluticasone 50 MCG/ACT nasal spray Commonly known as:  FLONASE Use one spray in each nostril twice daily   furosemide 20 MG tablet Commonly known as:  LASIX TAKE 1 TABLET (20 MG TOTAL) BY MOUTH DAILY.   indomethacin 75 MG CR capsule Commonly known as:  INDOCIN SR TAKE 1 CAPSULE BY MOUTH ONCE DAILY   irbesartan 300 MG tablet Commonly known as:  AVAPRO Take 300 mg by mouth daily.   montelukast 10 MG tablet Commonly known as:  SINGULAIR TAKE ONE TABLET BY MOUTH AT BEDTIME   multivitamin capsule Take 1 capsule by mouth daily.   omeprazole 40 MG capsule Commonly  known as:  PRILOSEC TAKE ONE CAPSULE BY MOUTH BEFORE BREAKFAST DAILY   polyethylene glycol packet Commonly known as:  MIRALAX / GLYCOLAX Take 17 g by mouth daily.   predniSONE 5 MG tablet Commonly known as:  DELTASONE Take 5 mg by mouth daily with breakfast.   pseudoephedrine 30 MG tablet Commonly known as:  SUDAFED Take 30 mg by mouth daily as needed for congestion.   ranitidine 300 MG tablet Commonly known as:  ZANTAC TAKE ONE TABLET BY MOUTH AT BEDTIME DAILY   traZODone 50 MG tablet Commonly known as:  DESYREL Take 100 mg by mouth at bedtime.   valACYclovir 1000 MG tablet Commonly known as:  VALTREX Take 1,000 mg by mouth daily.   vitamin C 500 MG tablet Commonly known as:  ASCORBIC ACID Take 500 mg by mouth daily.   Vitamin D3 125 MCG (5000 UT) Caps Take 5,000 Units by mouth daily.       Past Medical History:  Diagnosis Date  . Adenomatous polyp   . Anxiety    takes Ativan daily as needed  . Arthritis    inflammatory arthritis (Dr. Dagoberto Ligas)  . Asthma   . Chronic back pain    scoliosis   . Constipation    takes miralax every other day  . Depression    takes Lexapro daily  . Dysrhythmia   . Eczema   . Essential hypertension, benign    takes Hyzaar and Metoprolol daily  . GERD (gastroesophageal reflux disease)   . History of blood transfusion   . History of bronchitis 5 rys ago  . History of colon polyps   . History of shingles   . Hyperlipidemia    takes Atorvastatin daily  . Insomnia    takes Melatonin nightly  . Joint pain   . Pneumonia as a child  . PSVT (paroxysmal supraventricular tachycardia) ([place])   . Recurrent upper respiratory infection (URI)   . Rhinitis, allergic    uses Flonase daily  . Urinary urgency   . Urticaria     Past Surgical History:  Procedure Laterality Date  . ADENOIDECTOMY    . APPENDECTOMY    . BACK SURGERY  2012   at age 37 d/t scoliosis  . BLEPHAROPLASTY  2018  . BREAST BIOPSY    . cataract  surgery    . COLONOSCOPY    . KNEE ARTHROSCOPY Bilateral   . neuroma removed from foot     unsure of which foot  . POSTERIOR LUMBAR FUSION 4 LEVEL N/A 02/15/2018   Procedure: Decompression and fusion Thoracic nine to Lumbar two with exploration of previous fusion;  Surgeon: Erline Levine, MD;  Location: [facility];  Service: Neurosurgery;  Laterality: N/A;  . SHOULDER SURGERY Right   . TONSILLECTOMY    .  TOTAL HIP ARTHROPLASTY Right 04/15/2014   Procedure: RIGHT TOTAL HIP ARTHROPLASTY ANTERIOR APPROACH;  Surgeon: Renette Butters, MD;  Location: [facility];  Service: Orthopedics;  Laterality: Right;    Review of systems negative except as noted in HPI / PMHx or noted below:  Review of Systems  Constitutional: Negative.   HENT: Negative.   Eyes: Negative.   Respiratory: Negative.   Cardiovascular: Negative.   Gastrointestinal: Negative.   Genitourinary: Negative.   Musculoskeletal: Negative.   Skin: Negative.   Neurological: Negative.   Endo/Heme/Allergies: Negative.   Psychiatric/Behavioral: Negative.      Objective:   Vitals:   06/19/18 1556  BP: 128/62  Pulse: 70  Resp: 16  SpO2: 98%          Physical Exam Constitutional:      Appearance: She is not diaphoretic.  HENT:     Head: Normocephalic.     Right Ear: Ear canal and external ear normal. No middle ear effusion.     Left Ear: Ear canal and external ear normal.  No middle ear effusion (Good pneumatic movement).     Nose: Nose normal. No mucosal edema or rhinorrhea.     Mouth/Throat:     Pharynx: Uvula midline. No oropharyngeal exudate.  Eyes:     Conjunctiva/sclera: Conjunctivae normal.  Neck:     Thyroid: No thyromegaly.     Trachea: Trachea normal. No tracheal tenderness or tracheal deviation.  Cardiovascular:     Rate and Rhythm: Normal rate and regular rhythm.     Heart sounds: Normal heart sounds, S1 normal and S2 normal. No murmur.  Pulmonary:     Effort: No respiratory distress.     Breath sounds:  Normal breath sounds. No stridor. No wheezing or rales.  Lymphadenopathy:     Head:     Right side of head: No tonsillar adenopathy.     Left side of head: No tonsillar adenopathy.     Cervical: No cervical adenopathy.  Skin:    Findings: No erythema or rash.     Nails: There is no clubbing.   Neurological:     Mental Status: She is alert.     Diagnostics:    Spirometry was performed and demonstrated an FEV1 of 2.10 at 84 % of predicted.  The patient had an Asthma Control Test with the following results: ACT Total Score: 25.    Assessment and Plan:   1. Perennial allergic rhinitis   2. Asthma, well controlled, mild persistent   3. LPRD (laryngopharyngeal reflux disease)       1. Continue immunotherapy and Epi-Pen  2.  Continue to treat inflammation:   A. nasal fluticasone one spray each nostril twice a day  B. Nasal azelastine one spray each nostril one time per day  C. Singulair 10mg  tablet one time per day   3.  Continue to treat reflux:   A. consolidate all caffeine and chocolate consumption  B. omeprazole 40 mg one tablet in AM  C. ranitidine 300 mg one tablet in PM  4. If needed:   A. ProAir HFA 2 puffs every 4-6 hours  B. OTC antihistamine  C. Nasal saline  5.  For this recent episode can utilize the following:   A.  Discontinue pseudoephedrine  B.  Increase cetirizine to twice a day  C.  Mucinex twice a day  6.  Contact clinic next week with an update  7.  Return to clinic in 12 months or earlier if problem  I am not really sure why Vanessa Harmon is having her symptoms over the course of the past few weeks.  There does not appear to be any evidence of infection or significant inflammation of her respiratory tract with this event.  We will treat her with some symptomatic medications and assume that this will resolve.  She may be developing a side effect from the use of her pseudoephedrine will have her discontinue this agent.  I do not want to give her any  additional systemic steroids as she will soon be discussing with her rheumatologist about slowly tapering off her systemic steroids in the treatment of polymyalgia rheumatica.  If she does well I will see her back in this clinic in 12 months.  She will contact this clinic next week with an update regarding her acute symptoms.  Allena Katz, MD [facility] [place]

## 2018-06-20 ENCOUNTER — Encounter: Payer: Self-pay | Admitting: Allergy and Immunology

## 2018-07-03 ENCOUNTER — Telehealth: Payer: Self-pay | Admitting: *Deleted

## 2018-07-03 ENCOUNTER — Encounter: Payer: Self-pay | Admitting: *Deleted

## 2018-07-03 ENCOUNTER — Other Ambulatory Visit: Payer: Self-pay | Admitting: *Deleted

## 2018-07-03 MED ORDER — PREDNISONE 10 MG PO TABS: ORAL_TABLET | ORAL | 0 refills | Status: DC

## 2018-07-03 NOTE — Progress Notes (Signed)
This encounter was created in error - please disregard.

## 2018-07-03 NOTE — Telephone Encounter (Signed)
Can use Prednisone 20 mg daily for 5 days, then 15mg  daily for 5 days, then 10 mg daily for 5 days, then back to usual chronic dose.

## 2018-07-03 NOTE — Telephone Encounter (Signed)
Patient came in to get allergy shot states she is the same as she was on 06/19/2018 when she saw you. Patient wants to get prednisone as you guys discussed at her appt. States she saw another allergist and they did nothing with her daily plan. Dr Neldon Mc please advise

## 2018-07-03 NOTE — Telephone Encounter (Signed)
Prescription has been sent to requested pharmacy. Called patient and informed. Patient verbalized understanding.

## 2018-07-10 ENCOUNTER — Ambulatory Visit (INDEPENDENT_AMBULATORY_CARE_PROVIDER_SITE_OTHER): Payer: Medicare Other | Admitting: *Deleted

## 2018-07-10 DIAGNOSIS — J3089 Other allergic rhinitis: Secondary | ICD-10-CM

## 2018-07-25 ENCOUNTER — Telehealth: Payer: Self-pay | Admitting: Allergy and Immunology

## 2018-07-25 DIAGNOSIS — J329 Chronic sinusitis, unspecified: Secondary | ICD-10-CM

## 2018-07-25 NOTE — Telephone Encounter (Signed)
Patient is calling with sinus issues Patient feels it could be allergies or the start of a sinus infection What can she do?? Please call

## 2018-07-25 NOTE — Telephone Encounter (Signed)
Please advise 

## 2018-07-25 NOTE — Telephone Encounter (Signed)
Please inform patient that she may have a respiratory tract infection but it is probably viral in nature.  We usually do not start with antibiotics until 7 to 10 days of symptoms because most viral infections will start to improve by that point in time.  We used the standard therapy of lots of nasal wash and ibuprofen and continuing on all other medications as previously prescribed.  If she spikes a high fever and starts developing a cough and flulike symptoms then she needs to self isolate herself from other people inside her house assuming she may have covid and if she gets worse from that point on for then she will need to go to the emergency room.  She should attempt to avoid the emergency room if possible.

## 2018-07-25 NOTE — Telephone Encounter (Signed)
Patient called again requesting a call from Dr. Neldon Mc about her current sx. She stated she is feeling worse and the day goes by. Sinus pressure, yellow mucus, nasal congestion, no c/o fever, chills, N&V, patient has tried all her meds and still no relief.

## 2018-07-25 NOTE — Telephone Encounter (Signed)
Spoke with patient.  She was very upset.  She stated that she has been having these issues since January, and that they are NOT viral in nature. She stated "something has got to give".  The dose of Prednisone, did not work nor has Mucinex or any of the nasal sprays or rinses. She states that she does not accept this plan of treatment because she has tried it and it has not worked".   Please advise.

## 2018-07-26 ENCOUNTER — Telehealth: Payer: Self-pay

## 2018-07-26 ENCOUNTER — Ambulatory Visit (HOSPITAL_BASED_OUTPATIENT_CLINIC_OR_DEPARTMENT_OTHER)
Admission: RE | Admit: 2018-07-26 | Discharge: 2018-07-26 | Disposition: A | Discharge status: Home / Self Care | Payer: Medicare Other | Source: Ambulatory Visit | Attending: Allergy and Immunology | Admitting: Allergy and Immunology

## 2018-07-26 ENCOUNTER — Other Ambulatory Visit: Payer: Self-pay

## 2018-07-26 DIAGNOSIS — J329 Chronic sinusitis, unspecified: Secondary | ICD-10-CM | POA: Diagnosis present

## 2018-07-26 NOTE — Addendum Note (Signed)
Addended by: Valere Dross on: 07/26/2018 09:17 AM   Modules accepted: Orders

## 2018-07-26 NOTE — Telephone Encounter (Signed)
CT appt made and patient made aware

## 2018-07-26 NOTE — Telephone Encounter (Signed)
CT ordered will need to contact insurance to get CT approval and schedule patient for CT scan

## 2018-07-26 NOTE — Telephone Encounter (Signed)
Error

## 2018-07-26 NOTE — Telephone Encounter (Signed)
Please inform patient that we will get a sinus CT scan (Chronic Sinusitis) to find out exactly what is going on so we can provide the correct therapy.

## 2018-07-26 NOTE — Telephone Encounter (Signed)
Ordered placed for CT Appt made for 11:30 today 07/26/2018

## 2018-08-01 ENCOUNTER — Ambulatory Visit (INDEPENDENT_AMBULATORY_CARE_PROVIDER_SITE_OTHER): Payer: Medicare Other

## 2018-08-01 DIAGNOSIS — J309 Allergic rhinitis, unspecified: Secondary | ICD-10-CM | POA: Diagnosis not present

## 2018-08-06 IMAGING — DX DG FOOT COMPLETE 3+V*R*
3 series · 3 of 3 positions shown · non-contrast
Comparison: 11/16/2015

CLINICAL DATA: [place] 3 weeks ago injured 2nd digit; swelling
anterior of foot around metatarsal region x 3 days; caught 5th toe
on edge of suitcase and pain and discoloration

EXAM:
RIGHT FOOT COMPLETE - 3+ VIEW

[foot ap]
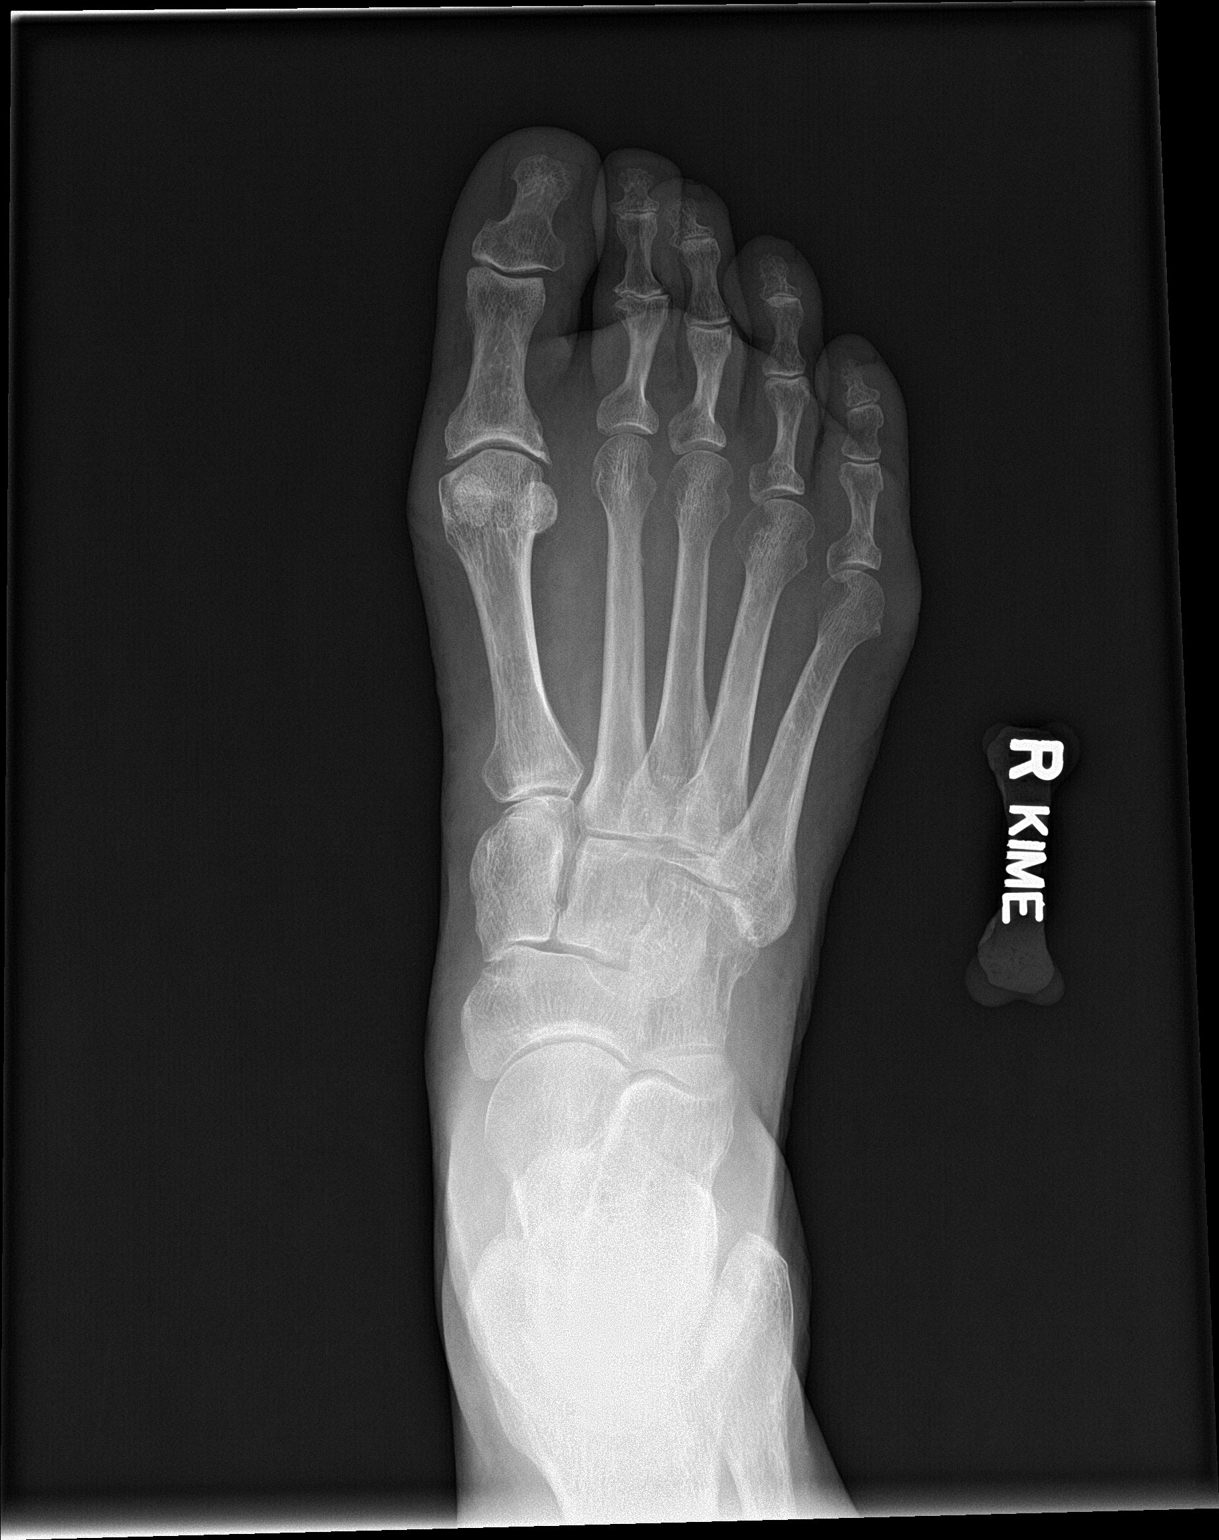

[foot obl]
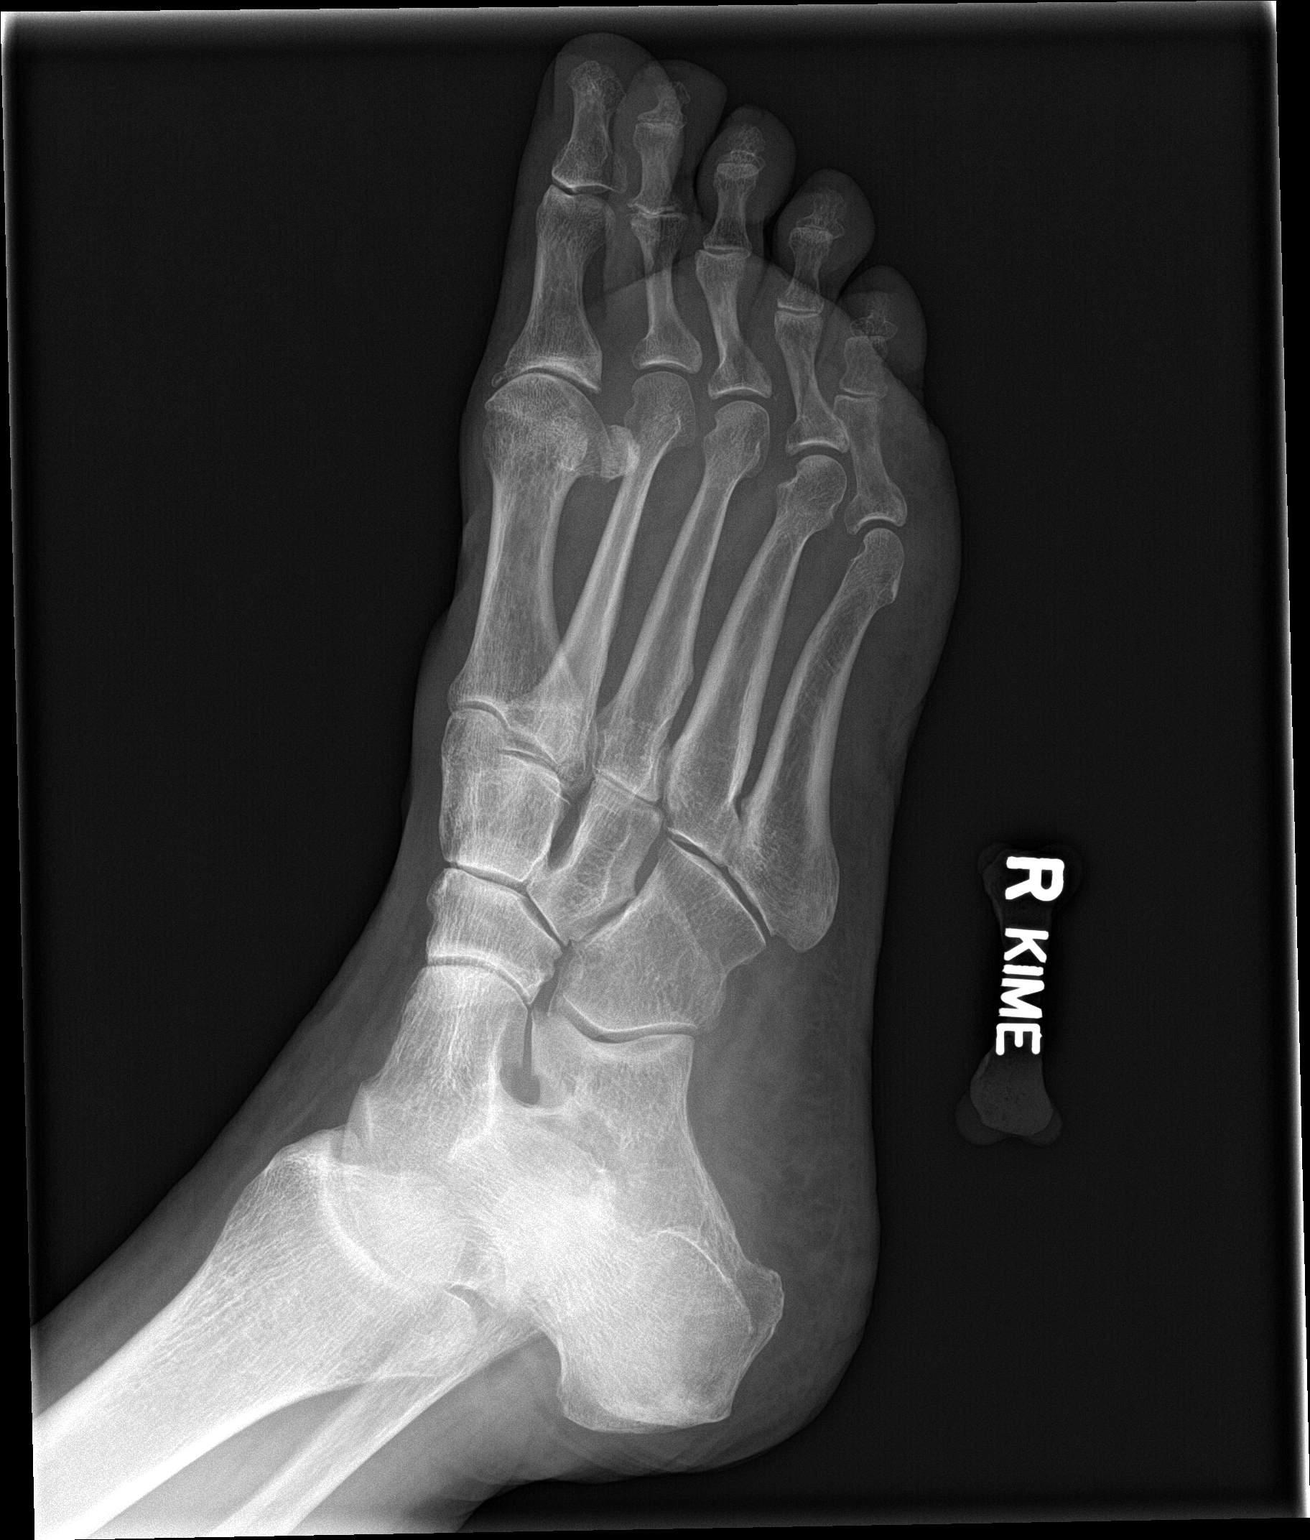

[foot lat]
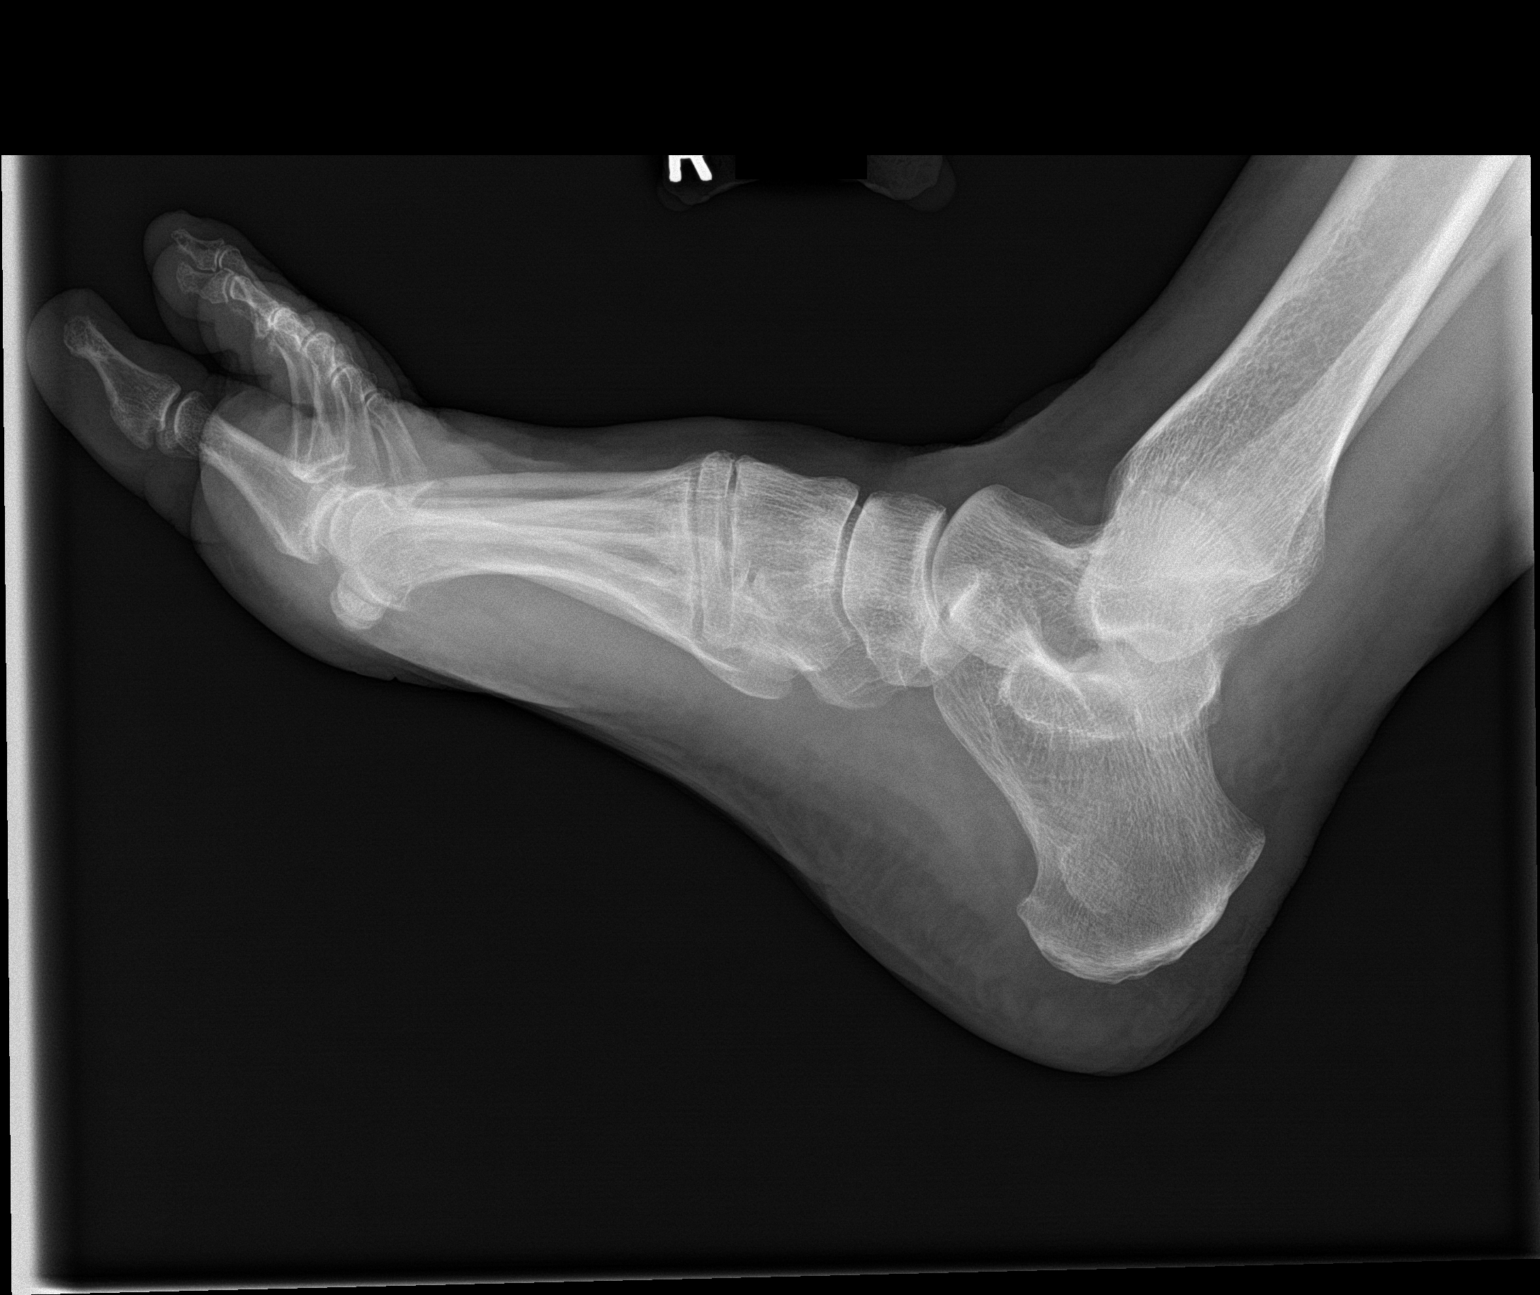

[3 of 3 positions shown; findings below may reference images not displayed]

FINDINGS: There is no evidence of fracture or dislocation. There is no
evidence of arthropathy or other focal bone abnormality. Soft
tissues are unremarkable.
IMPRESSION: Negative.

## 2018-08-29 ENCOUNTER — Telehealth: Payer: Self-pay | Admitting: Allergy and Immunology

## 2018-08-29 MED ORDER — FAMOTIDINE 40 MG PO TABS: 40.0000 mg | ORAL_TABLET | Freq: Every day | ORAL | 5 refills | Status: DC

## 2018-08-29 NOTE — Telephone Encounter (Signed)
Please advise Dr.Kozlow

## 2018-08-29 NOTE — Telephone Encounter (Signed)
Please provide famotidine 40 mg tablet to replace ranitidine 300 mg tablet

## 2018-08-29 NOTE — Telephone Encounter (Signed)
Patient said she went to fill her ranitidine and was told that it no longer exists. She said she took that and omeprazole each, once a day. She would like an alternative for the ranitidine, or if there is a medication that would cover both of these in one, she would like that. [employer].

## 2018-08-29 NOTE — Telephone Encounter (Signed)
New prescription has been sent in. Called patient and informed. Patient verbalized understanding.

## 2018-08-30 ENCOUNTER — Ambulatory Visit (INDEPENDENT_AMBULATORY_CARE_PROVIDER_SITE_OTHER): Payer: Medicare Other

## 2018-08-30 DIAGNOSIS — J309 Allergic rhinitis, unspecified: Secondary | ICD-10-CM | POA: Diagnosis not present

## 2018-09-18 DIAGNOSIS — J3081 Allergic rhinitis due to animal (cat) (dog) hair and dander: Secondary | ICD-10-CM

## 2018-09-18 NOTE — Progress Notes (Signed)
VIALS EXP 09-18-2019

## 2018-09-19 DIAGNOSIS — J3089 Other allergic rhinitis: Secondary | ICD-10-CM

## 2018-09-20 ENCOUNTER — Ambulatory Visit (INDEPENDENT_AMBULATORY_CARE_PROVIDER_SITE_OTHER): Payer: Medicare Other

## 2018-09-20 DIAGNOSIS — J309 Allergic rhinitis, unspecified: Secondary | ICD-10-CM

## 2018-10-09 ENCOUNTER — Ambulatory Visit (INDEPENDENT_AMBULATORY_CARE_PROVIDER_SITE_OTHER): Payer: Medicare Other | Admitting: *Deleted

## 2018-10-09 DIAGNOSIS — J309 Allergic rhinitis, unspecified: Secondary | ICD-10-CM | POA: Diagnosis not present

## 2018-11-02 ENCOUNTER — Ambulatory Visit (INDEPENDENT_AMBULATORY_CARE_PROVIDER_SITE_OTHER): Payer: Medicare Other | Admitting: *Deleted

## 2018-11-02 DIAGNOSIS — J309 Allergic rhinitis, unspecified: Secondary | ICD-10-CM | POA: Diagnosis not present

## 2018-11-12 ENCOUNTER — Ambulatory Visit (INDEPENDENT_AMBULATORY_CARE_PROVIDER_SITE_OTHER): Payer: Medicare Other

## 2018-11-12 DIAGNOSIS — J309 Allergic rhinitis, unspecified: Secondary | ICD-10-CM | POA: Diagnosis not present

## 2018-11-21 ENCOUNTER — Other Ambulatory Visit: Payer: Self-pay | Admitting: Allergy and Immunology

## 2018-11-23 ENCOUNTER — Ambulatory Visit (INDEPENDENT_AMBULATORY_CARE_PROVIDER_SITE_OTHER): Payer: Medicare Other | Admitting: *Deleted

## 2018-11-23 DIAGNOSIS — J309 Allergic rhinitis, unspecified: Secondary | ICD-10-CM

## 2018-12-07 ENCOUNTER — Ambulatory Visit (INDEPENDENT_AMBULATORY_CARE_PROVIDER_SITE_OTHER): Payer: Medicare Other

## 2018-12-07 DIAGNOSIS — J309 Allergic rhinitis, unspecified: Secondary | ICD-10-CM

## 2018-12-14 ENCOUNTER — Ambulatory Visit (INDEPENDENT_AMBULATORY_CARE_PROVIDER_SITE_OTHER): Payer: Medicare Other

## 2018-12-14 DIAGNOSIS — J309 Allergic rhinitis, unspecified: Secondary | ICD-10-CM

## 2018-12-25 ENCOUNTER — Ambulatory Visit (INDEPENDENT_AMBULATORY_CARE_PROVIDER_SITE_OTHER): Payer: Medicare Other | Admitting: *Deleted

## 2018-12-25 DIAGNOSIS — J309 Allergic rhinitis, unspecified: Secondary | ICD-10-CM

## 2018-12-31 ENCOUNTER — Other Ambulatory Visit: Payer: Self-pay | Admitting: Allergy and Immunology

## 2019-01-03 ENCOUNTER — Institutional Professional Consult (permissible substitution): Payer: Medicare Other | Admitting: Neurology

## 2019-01-08 ENCOUNTER — Other Ambulatory Visit: Payer: Self-pay

## 2019-01-08 ENCOUNTER — Other Ambulatory Visit: Payer: Self-pay | Admitting: Internal Medicine

## 2019-01-08 ENCOUNTER — Ambulatory Visit
Admission: RE | Admit: 2019-01-08 | Discharge: 2019-01-08 | Disposition: A | Discharge status: Home / Self Care | Payer: Medicare Other | Source: Ambulatory Visit | Attending: Internal Medicine | Admitting: Internal Medicine

## 2019-01-08 DIAGNOSIS — R059 Cough, unspecified: Secondary | ICD-10-CM

## 2019-01-08 DIAGNOSIS — R05 Cough: Secondary | ICD-10-CM

## 2019-01-10 ENCOUNTER — Encounter: Payer: Self-pay | Admitting: Allergy

## 2019-01-10 ENCOUNTER — Telehealth: Payer: Self-pay

## 2019-01-10 ENCOUNTER — Ambulatory Visit (INDEPENDENT_AMBULATORY_CARE_PROVIDER_SITE_OTHER): Payer: Medicare Other | Admitting: Allergy

## 2019-01-10 ENCOUNTER — Other Ambulatory Visit: Payer: Self-pay

## 2019-01-10 VITALS — BP 122/60 | HR 69 | Temp 97.3°F | Resp 16 | Ht 66.0 in | Wt 151.2 lb

## 2019-01-10 DIAGNOSIS — J4531 Mild persistent asthma with (acute) exacerbation: Secondary | ICD-10-CM

## 2019-01-10 DIAGNOSIS — K219 Gastro-esophageal reflux disease without esophagitis: Secondary | ICD-10-CM | POA: Diagnosis not present

## 2019-01-10 DIAGNOSIS — J3089 Other allergic rhinitis: Secondary | ICD-10-CM | POA: Diagnosis not present

## 2019-01-10 NOTE — Telephone Encounter (Signed)
Pt calling today.  Not feeling well today, has not been feeling well for the last couple of weeks.  Pt is requesting to be seen.  Having trouble breathing, has been COVID tested, negative.  Had a CXR, done at [facility], pt states that it was negative.  Suggested that if she is still having trouble breathing she can use her albuterol with her spacer, starting with 2 puffs every 4 hours prn, she can increase to 4 puffs if this is not working.  Offered her an appointment today to see Dr Nelva Bush @ 1650.  Pt accepted appointment.  Explained to patient that if she could not make it, please call us.

## 2019-01-10 NOTE — Patient Instructions (Addendum)
  1. Continue immunotherapy and Epi-Pen  2.  Continue to treat inflammation:   A. nasal fluticasone one spray each nostril twice a day -- hold your regular fluticasone while on trial of Xhance nasal spray (this is fluticasone in stronger strength and allows for deeper deposition of the spray into the sinus).  Use Xhance 2 sprays each nostril twice a day at this time.  Samples provided.    B. Nasal azelastine one spray each nostril one time per day  C. Singulair 10mg  tablet one time per day  D. Cetirizine 10mg  daily  E.  Use Symbicort 160mg  2 puffs twice a day with spacer while symptomatic with cough and shortness of breath.  Use until symptoms improve and then can stop.      3.  Continue to treat reflux:   A. consolidate all caffeine and chocolate consumption  B. omeprazole 40 mg one tablet in AM  C. ranitidine 300 mg one tablet in PM  4. If needed:   A. ProAir HFA 2 puffs every 4-6 hours  B. OTC antihistamine  C. Nasal saline  D.  Mucinex DM twice a day with plenty of water.  Samples provided  5.  Return to clinic in 12 months or earlier if needed with Dr. Neldon Mc

## 2019-01-10 NOTE — Progress Notes (Signed)
Follow-up Note  RE: Vanessa Harmon MRN: [id number] DOB: 01-28-1951 Date of Office Visit: 01/10/2019   History of present illness: Vanessa Harmon is a 68 y.o. female presenting today for sick visit.  She was last seen in the office on 06/19/2018 by Dr. Neldon Mc.      She states she has been miserable over the past week with productive cough, SOB and increased nasal congestion.  She states about 3-4 weeks ago she also was feel quite bad and had low grade fevers, chills, cough and did have her chronic daily prednisone increased and tapered back down over a week to her daily dose of 5mg .  She also was prescribed a course of cefdinir that she completed.  She thought symptoms had improved until she had a return of symptoms last week.     She had a televisit by her PCP for current symptoms on 01/08/2019 and diagnosed with viral bronchitis.  CXR was done on 01/08/2019 at a [facility] facility that was negative per notes.  I do not have access to the imaging at time of visit.  She has had Covid testing that was negative on 01/03/19.  She was advised to use her albuterol every 4 hours which she is not sure if it is helping that much.     She is taking her regular medications including fluticasone and azelastine and singulair.   She states the fluticasone helps some with her congestion but she stays chronically congested.  She takes cetirizine daily.   She has not tried use of mucinex or any other cough medications.     Review of systems: Review of Systems  Constitutional: Positive for chills, fever and malaise/fatigue.  Respiratory: Positive for cough.   Skin: Negative for itching and rash.    All other systems negative unless noted above in HPI  Past medical/social/surgical/family history have been reviewed and are unchanged unless specifically indicated below.  No changes  Medication List: Allergies as of 01/10/2019      Reactions   Breo Ellipta [fluticasone Furoate-vilanterol] Other (See Comments)   Severe hoarseness    Levaquin [levofloxacin] Other (See Comments)   Lightheadedness, not feeling well, ear pain   Spiriva Handihaler [tiotropium Bromide Monohydrate]    UNSPECIFIED REACTION    Tape Rash   PAPER TAPE: Causes severe rash      Medication List       Accurate as of January 10, 2019  6:17 PM. If you have any questions, ask your nurse or doctor.        STOP taking these medications   amitriptyline 10 MG tablet Commonly known as: ELAVIL Stopped by: Ariela Mochizuki Charmian Muff, MD   ranitidine 300 MG tablet Commonly known as: ZANTAC Stopped by: Jene Oravec Charmian Muff, MD     TAKE these medications   albuterol 108 (90 Base) MCG/ACT inhaler Commonly known as: ProAir HFA Inhale two puffs every 4-6 hours if needed for cough or wheeze.   aspirin 81 MG tablet Take 81 mg by mouth daily.   atorvastatin 80 MG tablet Commonly known as: LIPITOR TAKE 1/2 BY MOUTH ONCE DAILY What changed:   how much to take  how to take this  when to take this  additional instructions   Azelastine HCl 137 MCG/SPRAY Soln Place 1 spray into both nostrils 2 (two) times daily.   carboxymethylcellulose 0.5 % Soln Commonly known as: REFRESH PLUS 1 drop 3 (three) times daily as needed.   cetirizine 10 MG tablet Commonly known  as: ZYRTEC Take 10 mg by mouth daily as needed for allergies.   chlorthalidone 25 MG tablet Commonly known as: HYGROTON Take 12.5 mg by mouth daily.   dextromethorphan-guaiFENesin 30-600 MG 12hr tablet Commonly known as: MUCINEX DM Take 1 tablet by mouth daily as needed for cough.   escitalopram 20 MG tablet Commonly known as: LEXAPRO TAKE 1 TABLET BY MOUTH DAILY   famotidine 40 MG tablet Commonly known as: PEPCID TAKE 1 TABLET (40 MG TOTAL) BY MOUTH AT BEDTIME.   fluticasone 50 MCG/ACT nasal spray Commonly known as: FLONASE Use one spray in each nostril twice daily   furosemide 20 MG tablet Commonly known as: LASIX TAKE 1 TABLET (20 MG TOTAL)  BY MOUTH DAILY. What changed: how much to take   indomethacin 75 MG CR capsule Commonly known as: INDOCIN SR TAKE 1 CAPSULE BY MOUTH ONCE DAILY   INFLIXIMAB IV Inject into the vein.   irbesartan 300 MG tablet Commonly known as: AVAPRO Take 300 mg by mouth daily.   montelukast 10 MG tablet Commonly known as: SINGULAIR Take 1 tablet (10 mg total) by mouth at bedtime.   multivitamin capsule Take 1 capsule by mouth daily.   omeprazole 40 MG capsule Commonly known as: PRILOSEC TAKE ONE CAPSULE BY MOUTH DAILY BEFORE BREAKFAST DAILY   polyethylene glycol 17 g packet Commonly known as: MIRALAX / GLYCOLAX Take 17 g by mouth daily.   predniSONE 5 MG tablet Commonly known as: DELTASONE Take 5 mg by mouth daily with breakfast. What changed: Another medication with the same name was removed. Continue taking this medication, and follow the directions you see here. Changed by: Igor Bishop Charmian Muff, MD   pseudoephedrine 30 MG tablet Commonly known as: SUDAFED Take 30 mg by mouth daily as needed for congestion.   traZODone 50 MG tablet Commonly known as: DESYREL Take 100 mg by mouth at bedtime.   valACYclovir 1000 MG tablet Commonly known as: VALTREX Take 1,000 mg by mouth daily.   vitamin C 500 MG tablet Commonly known as: ASCORBIC ACID Take 500 mg by mouth daily.   Vitamin D3 125 MCG (5000 UT) Caps Take 5,000 Units by mouth daily.       Known medication allergies: Allergies  Allergen Reactions  . Breo Ellipta [Fluticasone Furoate-Vilanterol] Other (See Comments)    Severe hoarseness   . Levaquin [Levofloxacin] Other (See Comments)    Lightheadedness, not feeling well, ear pain  . Spiriva Handihaler [Tiotropium Bromide Monohydrate]     UNSPECIFIED REACTION   . Tape Rash    PAPER TAPE: Causes severe rash     Physical examination: Blood pressure 122/60, pulse 69, temperature (!) 97.3 F (36.3 C), temperature source Temporal, resp. rate 16, height 5\' 6"   (1.676 m), weight 151 lb 3.2 oz (68.6 kg), SpO2 95 %.  General: Alert, interactive, in no acute distress. HEENT: PERRLA, TMs pearly gray, turbinates moderately edematous with clear discharge, post-pharynx non erythematous. Neck: Supple without lymphadenopathy. Lungs: Mildly decreased breath sounds bilaterally without wheezing, rhonchi or rales. {no increased work of breathing.  Productive coughing during encounter CV: Normal S1, S2 without murmurs. Abdomen: Nondistended, nontender. Skin: Warm and dry, without lesions or rashes. Extremities:  No clubbing, cyanosis or edema. Neuro:   Grossly intact.  Diagnositics/Labs:  Spirometry: FEV1: 1.48L 59%, FVC: 1.51L 46%.  Each blow consisted of some degree of cough.    Assessment and plan: Asthma, mild persistent - with exacerbation due to likely viral illness (Covid neg).  In an attempt  to not increase her daily prednisone dose will have her initiate Symbicort during this time and once improved can discontinue.   Allergic rhinitis - chronic congestion reported with mild relief with fluticasone use thus will have her try Xhance and see if this is more effective LPRD - continue current regimen     1. Continue immunotherapy and Epi-Pen  2.  Continue to treat inflammation:   A. nasal fluticasone one spray each nostril twice a day -- hold your regular fluticasone while on trial of Xhance nasal spray (this is fluticasone in stronger strength and allows for deeper deposition of the spray into the sinus).  Use Xhance 2 sprays each nostril twice a day at this time.  Samples provided.    B. Nasal azelastine one spray each nostril one time per day  C. Singulair 10mg  tablet one time per day  D. Cetirizine 10mg  daily  E.  Use Symbicort 160mg  2 puffs twice a day with spacer while symptomatic with cough and shortness of breath.  Use until symptoms improve and then can stop.      3.  Continue to treat reflux:   A. consolidate all caffeine and chocolate  consumption  B. omeprazole 40 mg one tablet in AM  C. ranitidine 300 mg one tablet in PM  4. If needed:   A. ProAir HFA 2 puffs every 4-6 hours  B. OTC antihistamine  C. Nasal saline  D.  Mucinex DM twice a day with plenty of water.  Samples provided  5.  Return to clinic in 12 months or earlier if needed with Dr. Neldon Mc    I appreciate the opportunity to take part in Vanessa Harmon's care. Please do not hesitate to contact me with questions.  Sincerely,   Prudy Feeler, MD Allergy/Immunology [facility]

## 2019-01-14 DIAGNOSIS — G629 Polyneuropathy, unspecified: Secondary | ICD-10-CM | POA: Insufficient documentation

## 2019-01-14 DIAGNOSIS — M4135 Thoracogenic scoliosis, thoracolumbar region: Secondary | ICD-10-CM | POA: Insufficient documentation

## 2019-01-14 DIAGNOSIS — M4805 Spinal stenosis, thoracolumbar region: Secondary | ICD-10-CM | POA: Insufficient documentation

## 2019-01-25 DIAGNOSIS — M069 Rheumatoid arthritis, unspecified: Secondary | ICD-10-CM | POA: Insufficient documentation

## 2019-01-25 DIAGNOSIS — M059 Rheumatoid arthritis with rheumatoid factor, unspecified: Secondary | ICD-10-CM | POA: Insufficient documentation

## 2019-01-25 DIAGNOSIS — M0600 Rheumatoid arthritis without rheumatoid factor, unspecified site: Secondary | ICD-10-CM | POA: Insufficient documentation

## 2019-02-06 ENCOUNTER — Encounter: Payer: Self-pay | Admitting: *Deleted

## 2019-02-06 ENCOUNTER — Encounter: Payer: Self-pay | Admitting: Neurology

## 2019-02-06 ENCOUNTER — Ambulatory Visit: Payer: Medicare Other | Admitting: Neurology

## 2019-02-06 ENCOUNTER — Other Ambulatory Visit: Payer: Self-pay

## 2019-02-06 VITALS — BP 124/74 | HR 59 | Temp 98.0°F | Ht 66.0 in | Wt 152.5 lb

## 2019-02-06 DIAGNOSIS — R200 Anesthesia of skin: Secondary | ICD-10-CM | POA: Diagnosis not present

## 2019-02-06 DIAGNOSIS — G603 Idiopathic progressive neuropathy: Secondary | ICD-10-CM | POA: Diagnosis not present

## 2019-02-06 MED ORDER — ALPHA-LIPOIC ACID 600 MG PO CAPS: 600.0000 mg | ORAL_CAPSULE | Freq: Every day | ORAL | 0 refills | Status: DC

## 2019-02-06 NOTE — Patient Instructions (Addendum)
-May consider daily alpha lipoic acid which is an antioxidant that may reduce free radical oxidative stress associated with diabetic polyneuropathy, existing evidence suggests that alpha lipoic acid significantly reduces stabbing, lancinating and burning pain and diabetic neuropathy with its onset of action as early as 1-2 weeks. May also help repeair nerve ie numbness. 400-600mg  a day     Peripheral Neuropathy Peripheral neuropathy is a type of nerve damage. It affects nerves that carry signals between the spinal cord and the arms, legs, and the rest of the body (peripheral nerves). It does not affect nerves in the spinal cord or brain. In peripheral neuropathy, one nerve or a group of nerves may be damaged. Peripheral neuropathy is a broad category that includes many specific nerve disorders, like diabetic neuropathy, hereditary neuropathy, and carpal tunnel syndrome. What are the causes? This condition may be caused by:  Diabetes. This is the most common cause of peripheral neuropathy.  Nerve injury.  Pressure or stress on a nerve that lasts a long time.  Lack (deficiency) of B vitamins. This can result from alcoholism, poor diet, or a restricted diet.  Infections.  Autoimmune diseases, such as rheumatoid arthritis and systemic lupus erythematosus.  Nerve diseases that are passed from parent to child (inherited).  Some medicines, such as cancer medicines (chemotherapy).  Poisonous (toxic) substances, such as lead and mercury.  Too little blood flowing to the legs.  Kidney disease.  Thyroid disease. In some cases, the cause of this condition is not known. What are the signs or symptoms? Symptoms of this condition depend on which of your nerves is damaged. Common symptoms include:  Loss of feeling (numbness) in the feet, hands, or both.  Tingling in the feet, hands, or both.  Burning pain.  Very sensitive skin.  Weakness.  Not being able to move a part of the body  (paralysis).  Muscle twitching.  Clumsiness or poor coordination.  Loss of balance.  Not being able to control your bladder.  Feeling dizzy.  Sexual problems. How is this diagnosed? Diagnosing and finding the cause of peripheral neuropathy can be difficult. Your health care provider will take your medical history and do a physical exam. A neurological exam will also be done. This involves checking things that are affected by your brain, spinal cord, and nerves (nervous system). For example, your health care provider will check your reflexes, how you move, and what you can feel. You may have other tests, such as:  Blood tests.  Electromyogram (EMG) and nerve conduction tests. These tests check nerve function and how well the nerves are controlling the muscles.  Imaging tests, such as CT scans or MRI to rule out other causes of your symptoms.  Removing a small piece of nerve to be examined in a lab (nerve biopsy). This is rare.  Removing and examining a small amount of the fluid that surrounds the brain and spinal cord (lumbar puncture). This is rare. How is this treated? Treatment for this condition may involve:  Treating the underlying cause of the neuropathy, such as diabetes, kidney disease, or vitamin deficiencies.  Stopping medicines that can cause neuropathy, such as chemotherapy.  Medicine to relieve pain. Medicines may include: ? Prescription or over-the-counter pain medicine. ? Antiseizure medicine. ? Antidepressants. ? Pain-relieving patches that are applied to painful areas of skin.  Surgery to relieve pressure on a nerve or to destroy a nerve that is causing pain.  Physical therapy to help improve movement and balance.  Devices to help  you move around (assistive devices). Follow these instructions at home: Medicines  Take over-the-counter and prescription medicines only as told by your health care provider. Do not take any other medicines without first asking  your health care provider.  Do not drive or use heavy machinery while taking prescription pain medicine. Lifestyle   Do not use any products that contain nicotine or tobacco, such as cigarettes and e-cigarettes. Smoking keeps blood from reaching damaged nerves. If you need help quitting, ask your health care provider.  Avoid or limit alcohol. Too much alcohol can cause a vitamin B deficiency, and vitamin B is needed for healthy nerves.  Eat a healthy diet. This includes: ? Eating foods that are high in fiber, such as fresh fruits and vegetables, whole grains, and beans. ? Limiting foods that are high in fat and processed sugars, such as fried or sweet foods. General instructions   If you have diabetes, work closely with your health care provider to keep your blood sugar under control.  If you have numbness in your feet: ? Check every day for signs of injury or infection. Watch for redness, warmth, and swelling. ? Wear padded socks and comfortable shoes. These help protect your feet.  Develop a good support system. Living with peripheral neuropathy can be stressful. Consider talking with a mental health specialist or joining a support group.  Use assistive devices and attend physical therapy as told by your health care provider. This may include using a walker or a cane.  Keep all follow-up visits as told by your health care provider. This is important. Contact a health care provider if:  You have new signs or symptoms of peripheral neuropathy.  You are struggling emotionally from dealing with peripheral neuropathy.  Your pain is not well-controlled. Get help right away if:  You have an injury or infection that is not healing normally.  You develop new weakness in an arm or leg.  You fall frequently. Summary  Peripheral neuropathy is when the nerves in the arms, or legs are damaged, resulting in numbness, weakness, or pain.  There are many causes of peripheral neuropathy,  including diabetes, pinched nerves, vitamin deficiencies, autoimmune disease, and hereditary conditions.  Diagnosing and finding the cause of peripheral neuropathy can be difficult. Your health care provider will take your medical history, do a physical exam, and do tests, including blood tests and nerve function tests.  Treatment involves treating the underlying cause of the neuropathy and taking medicines to help control pain. Physical therapy and assistive devices may also help. This information is not intended to replace advice given to you by your health care provider. Make sure you discuss any questions you have with your health care provider. Document Released: 03/25/2002 Document Revised: 03/17/2017 Document Reviewed: 06/13/2016 Elsevier Patient Education  2020 [employer].

## 2019-02-06 NOTE — Progress Notes (Signed)
[facility]    Provider:  Dr Jaynee Eagles Requesting Provider: Erline Levine, MD Primary Care Provider:  Juanell Fairly, MD  CC:  Numbness in the feet  HPI:  Vanessa Harmon is a 68 y.o. female here as requested by Erline Levine, MD for polyneuropathy.  Past medical history rheumatoid arthritis not well controlled, peripheral polyneuropathy, radiculopathy of the lumbar region mild sciatica, pre-diabetes in the past but now lost weight and HgbA1c is great. prior thoracogenic scoliosis of thoracolumbar region status post surgery, elevated blood pressure reading without diagnosis of hypertension, October 2019 T9-L2 decompression and fusion with exploration of previous fusion.  Deficits from prior surgery include thoracolumbar discomfort and stiffness persist although improved with PT, right hip flexor strength improved but not yet returned to full strength, she has been on multiple medications for her RA including methotrexate, leflunomide, steroids. She does not know how long she has had numbness. She noticed at night, worse at night, she feels numbness and can't feel the sheets. No pain, numbness. She feels it in her toes and forefoot, a little worse on the right but bilateral, no pain, no weakness, no significant imbalance. She has sciatica which is mild, She has very narrow feet which she says she has always had, sisters and other family are wide, no hx of neuropathy in the family, she feels it is slowly progressive. Right hand is still numb and worse with RA symptoms.   Reviewed notes, labs and imaging from outside physicians, which showed:  I reviewed notes from Dr. Vertell Limber at [facility] who requested patient be seen.  On January 28, 2018 patient had T9-L2 decompression and fusion with exploration of adjacent level fusion.  Patient returned last seen November 19, 2018 with right-sided sciatic pain and numbness in the toes on both feet.  Right-sided lumbar buttocks hip pain with  occasional right leg pain typically every morning, stretching occasionally offer some relief, she noted numbness in the toes of both feet for the last several months, no injuries, rheumatoid arthritis remains an issue, now on prednisone for RA flares as well as leflunomide.  She does have some right-sided discomfort but intermittent not terribly severe.  She is taking Remicade but had a skin rash from its and is changing medications.  Minimal sciatica on examination and negative seated straight leg raise.  She has a stocking distribution numbness in both of her feet worse on the right than the left and increased pinprick at the level of mid shin on the right and just below the ankle on the left.  Her vibratory sensation is significantly diminished in both feet.  She is on amitriptyline, and atorvastatin as well as chlorthalidone and fish oil with hydrocodone, indomethacin, irbesartan, Lasix, leflunomide, Lexapro.  Given peripheral neuropathy she was referred here.  Diagnosed with peripheral neuropathy and radiculopathy, therapy genic scoliosis of thoracolumbar region.  01/25/2019: CBC nml, CMP unremarkable BUN 19, CReat 0.84, TSH nml,   10/18/2017 5.7 but 11/2018 5.5  Will request most recent MRI lumbar spine   Review of Systems: Patient complains of symptoms per HPI as well as the following symptoms: numbness, RA. Pertinent negatives and positives per HPI. All others negative.   Social History   Socioeconomic History  . Marital status: Married    Spouse name: Not on file  . Number of children: Not on file  . Years of education: Not on file  . Highest education level: Not on file  Occupational History  . Not on file  Social  Needs  . Financial resource strain: Not on file  . Food insecurity    Worry: Not on file    Inability: Not on file  . Transportation needs    Medical: Not on file    Non-medical: Not on file  Tobacco Use  . Smoking status: Former Smoker    Quit date: 1998    Years  since quitting: 22.8  . Smokeless tobacco: Never Used  . Tobacco comment: hasn't smoked in 22yrs  Substance and Sexual Activity  . Alcohol use: Yes    Comment: glass of wine occasionally if dining out   . Drug use: No  . Sexual activity: Yes    Birth control/protection: Post-menopausal  Lifestyle  . Physical activity    Days per week: Not on file    Minutes per session: Not on file  . Stress: Not on file  Relationships  . Social Herbalist on phone: Not on file    Gets together: Not on file    Attends religious service: Not on file    Active member of club or organization: Not on file    Attends meetings of clubs or organizations: Not on file    Relationship status: Not on file  . Intimate partner violence    Fear of current or ex partner: Not on file    Emotionally abused: Not on file    Physically abused: Not on file    Forced sexual activity: Not on file  Other Topics Concern  . Not on file  Social History Narrative   Lives at home with spouse   Right handed    Family History  Problem Relation Age of Onset  . Cancer Mother   . Stroke Mother   . Allergic rhinitis Mother   . Allergic rhinitis Father   . AAA (abdominal aortic aneurysm) Father   . COPD Father   . Hypertension Other        unspecified grandmother  . AAA (abdominal aortic aneurysm) Other        unspecified grandfather  . Cancer Other        unspecified grandfather  . Neuropathy Neg Hx     Past Medical History:  Diagnosis Date  . Adenomatous polyp   . Anxiety    takes Ativan daily as needed  . Arthritis    inflammatory arthritis (Dr. Dagoberto Ligas)  . Asthma   . Chronic back pain    scoliosis   . Constipation    takes miralax every other day  . Depression    takes Lexapro daily  . Dysrhythmia   . Eczema   . Essential hypertension, benign    takes Hyzaar and Metoprolol daily  . Fibromyalgia   . GERD (gastroesophageal reflux disease)   . History of blood transfusion   .  History of bronchitis 5 rys ago  . History of colon polyps   . History of shingles   . Hyperlipidemia    takes Atorvastatin daily  . Insomnia    takes Melatonin nightly  . Joint pain   . Osteoarthritis   . Pneumonia as a child  . PSVT (paroxysmal supraventricular tachycardia) ([place])   . Recurrent upper respiratory infection (URI)   . Rheumatoid arthritis ([place])   . Rhinitis, allergic    uses Flonase daily  . Urinary urgency   . Urticaria     Patient Active Problem List   Diagnosis Date Noted  . Idiopathic scoliosis of thoracolumbar region 02/15/2018  .  Asthma with acute exacerbation 05/10/2016  . Acute sinusitis 05/10/2016  . Chronic sinusitis 12/08/2015  . Polymyalgia rheumatica ([place]) 02/15/2015  . DJD (degenerative joint disease) 04/15/2014  . Lumbar spine scoliosis 11/28/2013  . Impaired glucose tolerance 12/19/2011  . Lumbar back pain 12/19/2011  . Non-seasonal allergic rhinitis 12/19/2011  . Hyperlipidemia 10/16/2010  . Depression 10/16/2010  . Lumbar spondylosis 10/16/2010  . GE reflux 10/16/2010  . Adenomatous colon polyp 10/16/2010  . History of paroxysmal supraventricular tachycardia 10/16/2010  . Hypertension 10/16/2010    Past Surgical History:  Procedure Laterality Date  . ADENOIDECTOMY    . APPENDECTOMY    . BACK SURGERY  2012   at age 48 d/t scoliosis  . BLEPHAROPLASTY  2018  . BREAST BIOPSY    . cataract surgery    . COLONOSCOPY    . KNEE ARTHROSCOPY Bilateral   . neuroma removed from foot     unsure of which foot  . POSTERIOR LUMBAR FUSION 4 LEVEL N/A 02/15/2018   Procedure: Decompression and fusion Thoracic nine to Lumbar two with exploration of previous fusion;  Surgeon: Erline Levine, MD;  Location: [facility];  Service: Neurosurgery;  Laterality: N/A;  . SHOULDER SURGERY Right   . TONSILLECTOMY    . TOTAL HIP ARTHROPLASTY Right 04/15/2014   Procedure: RIGHT TOTAL HIP ARTHROPLASTY ANTERIOR APPROACH;  Surgeon: Renette Butters, MD;  Location: [facility];  Service: Orthopedics;  Laterality: Right;    Current Outpatient Medications  Medication Sig Dispense Refill  . albuterol (PROAIR HFA) 108 (90 Base) MCG/ACT inhaler Inhale two puffs every 4-6 hours if needed for cough or wheeze. 8.5 Inhaler 1  . aspirin 81 MG tablet Take 81 mg by mouth daily.    Marland Kitchen atorvastatin (LIPITOR) 80 MG tablet TAKE 1/2 BY MOUTH ONCE DAILY (Patient taking differently: Take 80 mg by mouth daily. ) 90 tablet 1  . Azelastine HCl 137 MCG/SPRAY SOLN Place 1 spray into both nostrils 2 (two) times daily. 30 mL 5  . chlorthalidone (HYGROTON) 25 MG tablet Take 12.5 mg by mouth daily.    . Cholecalciferol (VITAMIN D3 PO) Take 50 mcg by mouth daily.    . cycloSPORINE (RESTASIS OP) Place 1 drop into both eyes 2 (two) times daily.    Marland Kitchen escitalopram (LEXAPRO) 20 MG tablet TAKE 1 TABLET BY MOUTH DAILY 90 tablet 3  . famotidine (PEPCID) 40 MG tablet TAKE 1 TABLET (40 MG TOTAL) BY MOUTH AT BEDTIME. 30 tablet 5  . fluticasone (FLONASE) 50 MCG/ACT nasal spray Use one spray in each nostril twice daily 16 g 5  . furosemide (LASIX) 20 MG tablet TAKE 1 TABLET (20 MG TOTAL) BY MOUTH DAILY. (Patient taking differently: Take 10 mg by mouth daily. ) 90 tablet 1  . gabapentin (NEURONTIN) 300 MG capsule Take 300 mg by mouth 3 (three) times daily.    . indomethacin (INDOCIN SR) 75 MG CR capsule TAKE 1 CAPSULE BY MOUTH ONCE DAILY 90 capsule 2  . INFLIXIMAB IV Inject into the vein every 8 (eight) weeks.     . irbesartan (AVAPRO) 300 MG tablet Take 300 mg by mouth daily.     Marland Kitchen leflunomide (ARAVA) 20 MG tablet Take 20 mg by mouth daily.    . montelukast (SINGULAIR) 10 MG tablet Take 1 tablet (10 mg total) by mouth at bedtime. 30 tablet 5  . Multiple Vitamin (MULTIVITAMIN) capsule Take 1 capsule by mouth daily.    . Omega-3 Fatty Acids (FISH OIL PO) Take 1,000  mg by mouth daily.    Marland Kitchen omeprazole (PRILOSEC) 40 MG capsule TAKE ONE CAPSULE BY MOUTH DAILY BEFORE BREAKFAST DAILY 30 capsule 4  . predniSONE  (DELTASONE) 5 MG tablet Take 5 mg by mouth daily with breakfast.    . traZODone (DESYREL) 50 MG tablet Take 100 mg by mouth at bedtime.     . valACYclovir (VALTREX) 1000 MG tablet Take 1,000 mg by mouth daily.     . vitamin C (ASCORBIC ACID) 500 MG tablet Take 1,000 mg by mouth daily.     . Alpha-Lipoic Acid 600 MG CAPS Take 1 capsule (600 mg total) by mouth daily. 30 capsule 0  . carboxymethylcellulose (REFRESH PLUS) 0.5 % SOLN 1 drop 3 (three) times daily as needed.    . cetirizine (ZYRTEC) 10 MG tablet Take 10 mg by mouth daily as needed for allergies.    Marland Kitchen dextromethorphan-guaiFENesin (MUCINEX DM) 30-600 MG 12hr tablet Take 1 tablet by mouth daily as needed for cough.    . polyethylene glycol (MIRALAX / GLYCOLAX) packet Take 17 g by mouth daily.    . pseudoephedrine (SUDAFED) 30 MG tablet Take 30 mg by mouth daily as needed for congestion.      Current Facility-Administered Medications  Medication Dose Route Frequency Provider Last Rate Last Dose  . Benralizumab SOSY 30 mg  30 mg Subcutaneous Q8 Weeks Kennith Gain, MD   30 mg at 04/04/17 1823    Allergies as of 02/06/2019 - Review Complete 02/06/2019  Allergen Reaction Noted  . Breo ellipta [fluticasone furoate-vilanterol] Other (See Comments) 03/07/2016  . Levaquin [levofloxacin] Other (See Comments) 02/02/2018  . Fosamax [alendronate sodium]  02/06/2019  . Spiriva handihaler [tiotropium bromide monohydrate]  03/23/2017  . Verapamil  02/06/2019  . Tape Rash 11/18/2013    Vitals: BP 124/74 (BP Location: Right Arm, Patient Position: Sitting)   Pulse (!) 59   Temp 98 F (36.7 C) Comment: taken at front door  Ht 5\' 6"  (1.676 m)   Wt 152 lb 8 oz (69.2 kg) Comment: pt reported from this morning  BMI 24.61 kg/m  Last Weight:  Wt Readings from Last 1 Encounters:  02/06/19 152 lb 8 oz (69.2 kg)   Last Height:   Ht Readings from Last 1 Encounters:  02/06/19 5\' 6"  (1.676 m)     Physical exam: Exam: Gen: NAD,  conversant, well nourised, well groomed                     CV: RRR, no MRG. No Carotid Bruits. No peripheral edema, warm, nontender Eyes: Conjunctivae clear without exudates or hemorrhage  Neuro: Detailed Neurologic Exam  Speech:    Speech is normal; fluent and spontaneous with normal comprehension.  Cognition:    The patient is oriented to person, place, and time;     recent and remote memory intact;     language fluent;     normal attention, concentration,     fund of knowledge Cranial Nerves:    The pupils are equal, round, and reactive to light. Cannot eval fundi small pupils.  Visual fields are full to finger confrontation. Extraocular movements are intact. Trigeminal sensation is intact and the muscles of mastication are normal. The face is symmetric. The palate elevates in the midline. Hearing intact. Voice is normal. Shoulder shrug is normal. The tongue has normal motion without fasciculations.   Coordination:    Normal finger to nose and heel to shin. Normal rapid alternating movements.   Gait:  antalgic  Motor Observation:    No asymmetry, no atrophy, and no involuntary movements noted. Tone:    Normal muscle tone.    Posture:    Posture is normal. normal erect    Strength: right hip flexion 3+/5, otherwise strength is V/V in the upper and lower limbs.      Sensation: Decreased pin prick to the ankle on the right and forfoot on the left, absent vibration to the medial malleolus     Reflex Exam:  DTR's:    Trace to 1+ AJs.  Toes:    The toes are downgoing bilaterally.   Clonus:    Clonus is absent.    Assessment/Plan:  37 68 year old with progressive polyneuropathy in the feet likely due to Rheumatoid Arthritis also risk factor has been elevated glucose in the past. Will order serum testing for other causes such as B12 deficiency. Cont Gabapentin. May try alpha-lipoic acid.  (Addendum B12 260, technically normal but extremely low-normal. Await  MethylMalonic Acid to confirm B12 deficiency)  Orders Placed This Encounter  Procedures  . B12 and Folate Panel  . Heavy metals, blood  . Vitamin B6  . Vitamin B1  . Methylmalonic acid, serum  . B. burgdorfi Antibody  . Tissue transglutaminase, IgA  . Gliadin antibodies, serum   Meds ordered this encounter  Medications  . Alpha-Lipoic Acid 600 MG CAPS    Sig: Take 1 capsule (600 mg total) by mouth daily.    Dispense:  30 capsule    Refill:  0    Cc: Juanell Fairly, MD,  Dr. Charm Barges, [facility] [address] [place], [place] [address]  Phone [phone] Fax [phone]

## 2019-02-07 ENCOUNTER — Encounter: Payer: Self-pay | Admitting: Neurology

## 2019-02-07 DIAGNOSIS — R200 Anesthesia of skin: Secondary | ICD-10-CM | POA: Insufficient documentation

## 2019-02-13 LAB — B12 AND FOLATE PANEL
Folate: 18.5 ng/mL (ref >3.0)
Vitamin B-12: 260 pg/mL (ref 232–1245)

## 2019-02-13 LAB — B. BURGDORFI ANTIBODIES: Lyme IgG/IgM Ab: <0.91 {ISR} (ref 0.00–0.90)

## 2019-02-13 LAB — VITAMIN B1: Thiamine: 134.3 nmol/L (ref 66.5–200.0)

## 2019-02-13 LAB — VITAMIN B6: Vitamin B6: 12.2 ug/L (ref 2.0–32.8)

## 2019-02-13 LAB — GLIADIN ANTIBODIES, SERUM
Antigliadin Abs, IgA: 4 units (ref 0–19)
Gliadin IgG: 1 units (ref 0–19)

## 2019-02-13 LAB — METHYLMALONIC ACID, SERUM: Methylmalonic Acid: 360 nmol/L (ref 0–378)

## 2019-02-13 LAB — HEAVY METALS, BLOOD
Arsenic: 7 ug/L (ref 2–23)
Lead, Blood: 1 ug/dL (ref 0–4)
Mercury: 1.9 ug/L (ref 0.0–14.9)

## 2019-02-13 LAB — TISSUE TRANSGLUTAMINASE, IGA: Transglutaminase IgA: <2 U/mL (ref 0–3)

## 2019-03-22 ENCOUNTER — Other Ambulatory Visit: Payer: Self-pay | Admitting: Neurosurgery

## 2019-03-22 DIAGNOSIS — M5416 Radiculopathy, lumbar region: Secondary | ICD-10-CM

## 2019-03-22 DIAGNOSIS — M4120 Other idiopathic scoliosis, site unspecified: Secondary | ICD-10-CM

## 2019-03-22 DIAGNOSIS — M4135 Thoracogenic scoliosis, thoracolumbar region: Secondary | ICD-10-CM

## 2019-03-25 ENCOUNTER — Telehealth: Payer: Self-pay | Admitting: Nurse Practitioner

## 2019-03-25 NOTE — Telephone Encounter (Signed)
Phone call to patient to verify medication list and allergies for myelogram procedure. Pt instructed to hold Lexapro, Trazodone for 48hrs prior to myelogram appointment time. Pt verbalized understanding. Pre and post procedure instructions reviewed with pt.

## 2019-04-02 ENCOUNTER — Ambulatory Visit
Admission: RE | Admit: 2019-04-02 | Discharge: 2019-04-02 | Disposition: A | Discharge status: Home / Self Care | Payer: Medicare Other | Source: Ambulatory Visit | Attending: Neurosurgery | Admitting: Neurosurgery

## 2019-04-02 VITALS — BP 164/78 | HR 68

## 2019-04-02 DIAGNOSIS — M4135 Thoracogenic scoliosis, thoracolumbar region: Secondary | ICD-10-CM

## 2019-04-02 DIAGNOSIS — M4120 Other idiopathic scoliosis, site unspecified: Secondary | ICD-10-CM

## 2019-04-02 DIAGNOSIS — M5416 Radiculopathy, lumbar region: Secondary | ICD-10-CM

## 2019-04-02 DIAGNOSIS — M4125 Other idiopathic scoliosis, thoracolumbar region: Secondary | ICD-10-CM

## 2019-04-02 DIAGNOSIS — M47816 Spondylosis without myelopathy or radiculopathy, lumbar region: Secondary | ICD-10-CM

## 2019-04-02 DIAGNOSIS — M545 Low back pain, unspecified: Secondary | ICD-10-CM

## 2019-04-02 MED ORDER — IOPAMIDOL (ISOVUE-M 300) INJECTION 61%
10.0000 mL | Freq: Once | INTRAMUSCULAR | Status: AC | PRN
  Administered 2019-04-02: 14:00:00 10 mL via INTRATHECAL

## 2019-04-02 MED ORDER — DIAZEPAM 5 MG PO TABS
5.0000 mg | ORAL_TABLET | Freq: Once | ORAL | Status: AC
  Administered 2019-04-02: 13:00:00 5 mg via ORAL

## 2019-04-02 NOTE — Progress Notes (Signed)
Patient states she has been off Lexapro and Trazodone for at least the past 48 hours.

## 2019-04-02 NOTE — Discharge Instructions (Signed)
Myelogram Discharge Instructions  1. Go home and rest quietly for the next 24 hours.  It is important to lie flat for the next 24 hours.  Get up only to go to the restroom.  You may lie in the bed or on a couch on your back, your stomach, your left side or your right side.  You may have one pillow under your head.  You may have pillows between your knees while you are on your side or under your knees while you are on your back.  2. DO NOT drive today.  Recline the seat as far back as it will go, while still wearing your seat belt, on the way home.  3. You may get up to go to the bathroom as needed.  You may sit up for 10 minutes to eat.  You may resume your normal diet and medications unless otherwise indicated.  Drink lots of extra fluids today and tomorrow.  4. The incidence of headache, nausea, or vomiting is about 5% (one in 20 patients).  If you develop a headache, lie flat and drink plenty of fluids until the headache goes away.  Caffeinated beverages may be helpful.  If you develop severe nausea and vomiting or a headache that does not go away with flat bed rest, call [phone].  5. You may resume normal activities after your 24 hours of bed rest is over; however, do not exert yourself strongly or do any heavy lifting tomorrow. If when you get up you have a headache when standing, go back to bed and force fluids for another 24 hours.  6. Call your physician for a follow-up appointment.  The results of your myelogram will be sent directly to your physician by the following day.  7. If you have any questions or if complications develop after you arrive home, please call [phone].  Discharge instructions have been explained to the patient.  The patient, or the person responsible for the patient, fully understands these instructions.  YOU MAY RESTART YOUR LEXAPRO AND TRAZODONE TOMORROW 04/03/2019 AT 1:00PM.

## 2019-08-08 ENCOUNTER — Emergency Department (HOSPITAL_BASED_OUTPATIENT_CLINIC_OR_DEPARTMENT_OTHER): Payer: Medicare Other

## 2019-08-08 ENCOUNTER — Emergency Department (HOSPITAL_BASED_OUTPATIENT_CLINIC_OR_DEPARTMENT_OTHER)
Admission: EM | Admit: 2019-08-08 | Discharge: 2019-08-08 | Disposition: A | Discharge status: Home / Self Care | Payer: Medicare Other | Attending: Emergency Medicine | Admitting: Emergency Medicine

## 2019-08-08 ENCOUNTER — Other Ambulatory Visit: Payer: Self-pay

## 2019-08-08 ENCOUNTER — Encounter (HOSPITAL_BASED_OUTPATIENT_CLINIC_OR_DEPARTMENT_OTHER): Payer: Self-pay | Admitting: *Deleted

## 2019-08-08 DIAGNOSIS — Y999 Unspecified external cause status: Secondary | ICD-10-CM | POA: Diagnosis not present

## 2019-08-08 DIAGNOSIS — S52502A Unspecified fracture of the lower end of left radius, initial encounter for closed fracture: Secondary | ICD-10-CM

## 2019-08-08 DIAGNOSIS — I10 Essential (primary) hypertension: Secondary | ICD-10-CM | POA: Insufficient documentation

## 2019-08-08 DIAGNOSIS — Y9301 Activity, walking, marching and hiking: Secondary | ICD-10-CM | POA: Insufficient documentation

## 2019-08-08 DIAGNOSIS — Z881 Allergy status to other antibiotic agents status: Secondary | ICD-10-CM | POA: Diagnosis not present

## 2019-08-08 DIAGNOSIS — Z87891 Personal history of nicotine dependence: Secondary | ICD-10-CM | POA: Diagnosis not present

## 2019-08-08 DIAGNOSIS — W010XXA Fall on same level from slipping, tripping and stumbling without subsequent striking against object, initial encounter: Secondary | ICD-10-CM | POA: Insufficient documentation

## 2019-08-08 DIAGNOSIS — Y929 Unspecified place or not applicable: Secondary | ICD-10-CM | POA: Insufficient documentation

## 2019-08-08 DIAGNOSIS — S6992XA Unspecified injury of left wrist, hand and finger(s), initial encounter: Secondary | ICD-10-CM | POA: Diagnosis present

## 2019-08-08 DIAGNOSIS — Z888 Allergy status to other drugs, medicaments and biological substances status: Secondary | ICD-10-CM | POA: Diagnosis not present

## 2019-08-08 DIAGNOSIS — M25532 Pain in left wrist: Secondary | ICD-10-CM

## 2019-08-08 DIAGNOSIS — E785 Hyperlipidemia, unspecified: Secondary | ICD-10-CM | POA: Diagnosis not present

## 2019-08-08 DIAGNOSIS — Z79899 Other long term (current) drug therapy: Secondary | ICD-10-CM | POA: Diagnosis not present

## 2019-08-08 DIAGNOSIS — Z7982 Long term (current) use of aspirin: Secondary | ICD-10-CM | POA: Insufficient documentation

## 2019-08-08 DIAGNOSIS — S52592A Other fractures of lower end of left radius, initial encounter for closed fracture: Secondary | ICD-10-CM | POA: Diagnosis not present

## 2019-08-08 DIAGNOSIS — M25539 Pain in unspecified wrist: Secondary | ICD-10-CM

## 2019-08-08 MED ORDER — LIDOCAINE HCL 2 % IJ SOLN
20.0000 mL | Freq: Once | INTRAMUSCULAR | Status: AC
  Administered 2019-08-08: 400 mg
  Filled 2019-08-08: qty 20

## 2019-08-08 MED ORDER — HYDROCODONE-ACETAMINOPHEN 5-325 MG PO TABS: 1.0000 | ORAL_TABLET | Freq: Four times a day (QID) | ORAL | 0 refills | Status: DC | PRN

## 2019-08-08 MED ORDER — HYDROCODONE-ACETAMINOPHEN 5-325 MG PO TABS
2.0000 | ORAL_TABLET | Freq: Once | ORAL | Status: AC
  Administered 2019-08-08: 2 via ORAL
  Filled 2019-08-08: qty 2

## 2019-08-08 NOTE — Discharge Instructions (Signed)
You have a wrist fracture.  Take tylenol for pain and take Vicodin for severe pain  Call Dr. Levell July office tomorrow for appointment.  Return to ER if you have worse wrist pain, fingers turning blue

## 2019-08-08 NOTE — ED Triage Notes (Signed)
Pt c/o fall from standing landing on concrete injury left wrist , obvious deformity

## 2019-08-08 NOTE — ED Provider Notes (Addendum)
[facility] EMERGENCY DEPARTMENT Provider Note   CSN: [id number] Arrival date & time: 08/08/19  1633     History Chief Complaint  Patient presents with  . Wrist Injury    Vanessa Harmon is a 69 y.o. female here presenting with wrist injury.  Patient has history of arthritis and was walking and tripped over a step and landed with outstretched hand.  She states that she noticed left wrist deformity right away. Patient states that she hit her face as well but denies any head injury or loss of consciousness.  Patient denies being on blood thinners.  The history is provided by the patient.       Past Medical History:  Diagnosis Date  . Adenomatous polyp   . Anxiety    takes Ativan daily as needed  . Arthritis    inflammatory arthritis (Dr. Dagoberto Ligas)  . Asthma   . Chronic back pain    scoliosis   . Constipation    takes miralax every other day  . Depression    takes Lexapro daily  . Dysrhythmia   . Eczema   . Essential hypertension, benign    takes Hyzaar and Metoprolol daily  . Fibromyalgia   . GERD (gastroesophageal reflux disease)   . History of blood transfusion   . History of bronchitis 5 rys ago  . History of colon polyps   . History of shingles   . Hyperlipidemia    takes Atorvastatin daily  . Insomnia    takes Melatonin nightly  . Joint pain   . Osteoarthritis   . Pneumonia as a child  . PSVT (paroxysmal supraventricular tachycardia) ([place])   . Recurrent upper respiratory infection (URI)   . Rheumatoid arthritis ([place])   . Rhinitis, allergic    uses Flonase daily  . Urinary urgency   . Urticaria     Patient Active Problem List   Diagnosis Date Noted  . Numbness in feet 02/07/2019  . Idiopathic scoliosis of thoracolumbar region 02/15/2018  . Asthma with acute exacerbation 05/10/2016  . Acute sinusitis 05/10/2016  . Chronic sinusitis 12/08/2015  . Polymyalgia rheumatica ([place]) 02/15/2015  . DJD (degenerative joint disease)  04/15/2014  . Lumbar spine scoliosis 11/28/2013  . Impaired glucose tolerance 12/19/2011  . Lumbar back pain 12/19/2011  . Non-seasonal allergic rhinitis 12/19/2011  . Hyperlipidemia 10/16/2010  . Depression 10/16/2010  . Lumbar spondylosis 10/16/2010  . GE reflux 10/16/2010  . Adenomatous colon polyp 10/16/2010  . History of paroxysmal supraventricular tachycardia 10/16/2010  . Hypertension 10/16/2010    Past Surgical History:  Procedure Laterality Date  . ADENOIDECTOMY    . APPENDECTOMY    . BACK SURGERY  2012   at age 89 d/t scoliosis  . BLEPHAROPLASTY  2018  . BREAST BIOPSY    . cataract surgery    . COLONOSCOPY    . KNEE ARTHROSCOPY Bilateral   . neuroma removed from foot     unsure of which foot  . POSTERIOR LUMBAR FUSION 4 LEVEL N/A 02/15/2018   Procedure: Decompression and fusion Thoracic nine to Lumbar two with exploration of previous fusion;  Surgeon: Erline Levine, MD;  Location: [facility];  Service: Neurosurgery;  Laterality: N/A;  . SHOULDER SURGERY Right   . TONSILLECTOMY    . TOTAL HIP ARTHROPLASTY Right 04/15/2014   Procedure: RIGHT TOTAL HIP ARTHROPLASTY ANTERIOR APPROACH;  Surgeon: Renette Butters, MD;  Location: [facility];  Service: Orthopedics;  Laterality: Right;     OB  History   No obstetric history on file.     Family History  Problem Relation Age of Onset  . Cancer Mother   . Stroke Mother   . Allergic rhinitis Mother   . Allergic rhinitis Father   . AAA (abdominal aortic aneurysm) Father   . COPD Father   . Hypertension Other        unspecified grandmother  . AAA (abdominal aortic aneurysm) Other        unspecified grandfather  . Cancer Other        unspecified grandfather  . Neuropathy Neg Hx     Social History   Tobacco Use  . Smoking status: Former Smoker    Quit date: 1998    Years since quitting: 23.3  . Smokeless tobacco: Never Used  . Tobacco comment: hasn't smoked in 64yrs  Substance Use Topics  . Alcohol use: Yes     Comment: glass of wine occasionally if dining out   . Drug use: No    Home Medications Prior to Admission medications   Medication Sig Start Date End Date Taking? Authorizing Provider  albuterol (PROAIR HFA) 108 (90 Base) MCG/ACT inhaler Inhale two puffs every 4-6 hours if needed for cough or wheeze. 03/26/18   Kozlow, Donnamarie Poag, MD  Alpha-Lipoic Acid 600 MG CAPS Take 1 capsule (600 mg total) by mouth daily. 02/06/19   Melvenia Beam, MD  aspirin 81 MG tablet Take 81 mg by mouth daily.    [provider]  atorvastatin (LIPITOR) 80 MG tablet TAKE 1/2 BY MOUTH ONCE DAILY Patient taking differently: Take 80 mg by mouth daily.  07/14/17   Elby Showers, MD  Azelastine HCl 137 MCG/SPRAY SOLN Place 1 spray into both nostrils 2 (two) times daily. 06/19/18   Kozlow, Donnamarie Poag, MD  carboxymethylcellulose (REFRESH PLUS) 0.5 % SOLN 1 drop 3 (three) times daily as needed.    [provider]  cetirizine (ZYRTEC) 10 MG tablet Take 10 mg by mouth daily as needed for allergies.    [provider]  chlorthalidone (HYGROTON) 25 MG tablet Take 12.5 mg by mouth daily.    [provider]  Cholecalciferol (VITAMIN D3 PO) Take 50 mcg by mouth daily.    [provider]  cycloSPORINE (RESTASIS OP) Place 1 drop into both eyes 2 (two) times daily.    [provider]  dextromethorphan-guaiFENesin (MUCINEX DM) 30-600 MG 12hr tablet Take 1 tablet by mouth daily as needed for cough.    [provider]  escitalopram (LEXAPRO) 20 MG tablet TAKE 1 TABLET BY MOUTH DAILY 08/12/17   Elby Showers, MD  famotidine (PEPCID) 40 MG tablet TAKE 1 TABLET (40 MG TOTAL) BY MOUTH AT BEDTIME. 11/21/18   Kozlow, Donnamarie Poag, MD  fluticasone [facility]) 50 MCG/ACT nasal spray Use one spray in each nostril twice daily 06/19/18   Kozlow, Donnamarie Poag, MD  furosemide (LASIX) 20 MG tablet TAKE 1 TABLET (20 MG TOTAL) BY MOUTH DAILY. Patient taking differently: Take 10 mg by mouth daily.  07/05/17   Elby Showers, MD  gabapentin (NEURONTIN) 300 MG capsule Take 300 mg by mouth 3 (three) times daily. 01/14/19   [provider]  indomethacin (INDOCIN SR) 75 MG CR capsule TAKE 1 CAPSULE BY MOUTH ONCE DAILY 08/14/17   Elby Showers, MD  INFLIXIMAB IV Inject into the vein every 8 (eight) weeks.     [provider]  irbesartan (AVAPRO) 300 MG tablet Take 300 mg  by mouth daily.     [provider]  leflunomide (ARAVA) 20 MG tablet Take 20 mg by mouth daily.    [provider]  montelukast (SINGULAIR) 10 MG tablet Take 1 tablet (10 mg total) by mouth at bedtime. 06/19/18   Kozlow, Donnamarie Poag, MD  Multiple Vitamin (MULTIVITAMIN) capsule Take 1 capsule by mouth daily.    [provider]  Omega-3 Fatty Acids (FISH OIL PO) Take 1,000 mg by mouth daily.    [provider]  omeprazole (PRILOSEC) 40 MG capsule TAKE ONE CAPSULE BY MOUTH DAILY BEFORE BREAKFAST DAILY 12/31/18   Kozlow, Donnamarie Poag, MD  polyethylene glycol (MIRALAX / GLYCOLAX) packet Take 17 g by mouth daily.    [provider]  predniSONE (DELTASONE) 5 MG tablet Take 5 mg by mouth daily with breakfast.    [provider]  pseudoephedrine (SUDAFED) 30 MG tablet Take 30 mg by mouth daily as needed for congestion.     [provider]  traZODone (DESYREL) 50 MG tablet Take 100 mg by mouth at bedtime.     [provider]  valACYclovir (VALTREX) 1000 MG tablet Take 1,000 mg by mouth daily.  09/02/16   [provider]  vitamin C (ASCORBIC ACID) 500 MG tablet Take 1,000 mg by mouth daily.     [provider]    Allergies    Breo ellipta [fluticasone furoate-vilanterol], Fosamax [alendronate sodium], Levaquin [levofloxacin], Spiriva handihaler [tiotropium bromide monohydrate], Verapamil, and Tape  Review of Systems   Review of Systems  Musculoskeletal:       L wrist pain   All other systems reviewed and are negative.   Physical Exam Updated Vital Signs BP  (!) 142/82   Pulse 64   Temp 98.3 F (36.8 C) (Oral)   Resp 18   Ht 5\' 6"  (1.676 m)   Wt 72.6 kg   SpO2 100%   BMI 25.82 kg/m   Physical Exam Vitals and nursing note reviewed.  Constitutional:      Appearance: Normal appearance.  HENT:     Head: Normocephalic and atraumatic.     Mouth/Throat:     Mouth: Mucous membranes are moist.     Comments: No missing teeth. No jaw tenderness  Eyes:     Extraocular Movements: Extraocular movements intact.     Pupils: Pupils are equal, round, and reactive to light.  Cardiovascular:     Rate and Rhythm: Normal rate.     Pulses: Normal pulses.  Pulmonary:     Effort: Pulmonary effort is normal.  Abdominal:     General: Abdomen is flat.  Musculoskeletal:     Cervical back: Normal range of motion.     Comments: L wrist obvious deformity with dorsal angulation.  She has good radial pulses and able to hand grasp.  Normal capillary refill.  Skin:    General: Skin is warm.     Capillary Refill: Capillary refill takes less than 2 seconds.  Neurological:     General: No focal deficit present.     Mental Status: She is alert and oriented to person, place, and time.  Psychiatric:        Mood and Affect: Mood normal.        Behavior: Behavior normal.     ED Results / Procedures / Treatments   Labs (all labs ordered are listed, but only abnormal results are displayed) Labs Reviewed - No data to display  EKG None  Radiology DG Wrist 2  Views Left  Result Date: 08/08/2019 CLINICAL DATA:  Post reduction imaging of the left wrist. EXAM: LEFT WRIST - 2 VIEW COMPARISON:  Pre reduction images obtained earlier today. FINDINGS: There has been significant improvement in fracture alignment following closed reduction. Radial length has been partly restored. There is improvement in the radial inclination and the dorsal angulation of the radial articular surface now measures approximately 9 degrees. Wrist joints are normally aligned. IMPRESSION:  Significant improvement in the alignment of the distal left radial fracture following closed reduction. Electronically Signed   By: Lajean Manes M.D.   On: 08/08/2019 19:58   DG Wrist Complete Left  Result Date: 08/08/2019 CLINICAL DATA:  69 year old who fell from a standing position and injured the LEFT wrist. Initial encounter. EXAM: LEFT WRIST - COMPLETE 3+ VIEW COMPARISON:  None. FINDINGS: Acute comminuted intra-articular fracture involving the distal radius with dorsal angulation and mild impaction. No other fractures. Osseous demineralization. Moderate narrowing of the trapezium-first metacarpal joint space. IMPRESSION: 1. Acute comminuted intra-articular fracture involving the distal radius with dorsal angulation and mild impaction. 2. Osseous demineralization. Electronically Signed   By: Evangeline Dakin M.D.   On: 08/08/2019 17:20    Procedures Reduction of fracture  Date/Time: 08/08/2019 8:44 PM Performed by: Drenda Freeze, MD Authorized by: Drenda Freeze, MD  Consent: Verbal consent obtained. Risks and benefits: risks, benefits and alternatives were discussed Consent given by: patient Patient understanding: patient states understanding of the procedure being performed Patient consent: the patient's understanding of the procedure matches consent given Procedure consent: procedure consent matches procedure scheduled Relevant documents: relevant documents present and verified Test results: test results available and properly labeled Site marked: the operative site was marked Required items: required blood products, implants, devices, and special equipment available Patient identity confirmed: verbally with patient Local anesthesia used: yes Anesthesia: hematoma block  Anesthesia: Local anesthesia used: yes Local Anesthetic: lidocaine 2% without epinephrine Anesthetic total: 10 mL  Sedation: Patient sedated: no  Patient tolerance: patient tolerated the procedure  well with no immediate complications    (including critical care time)  SPLINT APPLICATION Date/Time: 123XX123 PM Authorized by: Wandra Arthurs Consent: Verbal consent obtained. Risks and benefits: risks, benefits and alternatives were discussed Consent given by: patient Splint applied by: me Location details: L wrist Splint type: sugar tongue Supplies used: web roll, fiber glass Post-procedure: The splinted body part was neurovascularly unchanged following the procedure. Patient tolerance: Patient tolerated the procedure well with no immediate complications.     Medications Ordered in ED Medications  HYDROcodone-acetaminophen (NORCO/VICODIN) 5-325 MG per tablet 2 tablet (2 tablets Oral Given 08/08/19 1758)  lidocaine (XYLOCAINE) 2 % (with pres) injection 400 mg (400 mg Other Given by Other 08/08/19 1858)    ED Course  I have reviewed the triage vital signs and the nursing notes.  Pertinent labs & imaging results that were available during my care of the patient were reviewed by me and considered in my medical decision making (see chart for details).    MDM Rules/Calculators/A&P                      Vanessa Harmon is a 69 y.o. female here presenting with left wrist injury.  Patient did hit her face as well but there is no signs of missing teeth or head injury.  Patient does have a obvious deformity to left wrist.  X-ray showed impacted distal radius fracture. I talked to Dr. Fredna Dow from hand surgery.  He recommend hematoma block with reduction at bedside.  Reduction performed and postreduction x-ray showed much improved alignment.  She will call Dr. Levell July office tomorrow for appointment to schedule for surgery.  Final Clinical Impression(s) / ED Diagnoses Final diagnoses:  Wrist pain  Left wrist pain    Rx / DC Orders ED Discharge Orders    None       Drenda Freeze, MD 08/08/19 2046    Drenda Freeze, MD 08/08/19 2046

## 2019-08-08 NOTE — ED Notes (Signed)
Applied cardboard bracing to left wrist/ arm

## 2019-08-09 ENCOUNTER — Encounter (HOSPITAL_BASED_OUTPATIENT_CLINIC_OR_DEPARTMENT_OTHER): Payer: Self-pay | Admitting: Orthopedic Surgery

## 2019-08-09 ENCOUNTER — Other Ambulatory Visit: Payer: Self-pay | Admitting: Orthopedic Surgery

## 2019-08-12 ENCOUNTER — Other Ambulatory Visit (HOSPITAL_COMMUNITY)
Admission: RE | Admit: 2019-08-12 | Discharge: 2019-08-12 | Disposition: A | Discharge status: Home / Self Care | Payer: Medicare Other | Source: Ambulatory Visit | Attending: Orthopedic Surgery | Admitting: Orthopedic Surgery

## 2019-08-12 ENCOUNTER — Encounter (HOSPITAL_BASED_OUTPATIENT_CLINIC_OR_DEPARTMENT_OTHER)
Admission: RE | Admit: 2019-08-12 | Discharge: 2019-08-12 | Disposition: A | Discharge status: Home / Self Care | Payer: Medicare Other | Source: Ambulatory Visit | Attending: Orthopedic Surgery | Admitting: Orthopedic Surgery

## 2019-08-12 DIAGNOSIS — Z20822 Contact with and (suspected) exposure to covid-19: Secondary | ICD-10-CM | POA: Diagnosis not present

## 2019-08-12 DIAGNOSIS — R001 Bradycardia, unspecified: Secondary | ICD-10-CM | POA: Insufficient documentation

## 2019-08-12 DIAGNOSIS — Z01818 Encounter for other preprocedural examination: Secondary | ICD-10-CM | POA: Insufficient documentation

## 2019-08-12 LAB — BASIC METABOLIC PANEL
Anion gap: 6 (ref 5–15)
BUN: 16 mg/dL (ref 8–23)
CO2: 29 mmol/L (ref 22–32)
Calcium: 8.6 mg/dL — ABNORMAL LOW (ref 8.9–10.3)
Chloride: 97 mmol/L — ABNORMAL LOW (ref 98–111)
Creatinine, Ser: 0.89 mg/dL (ref 0.44–1.00)
GFR calc Af Amer: >60 mL/min (ref >60)
GFR calc non Af Amer: >60 mL/min (ref >60)
Glucose, Bld: 153 mg/dL — ABNORMAL HIGH (ref 70–99)
Potassium: 4.5 mmol/L (ref 3.5–5.1)
Sodium: 132 mmol/L — ABNORMAL LOW (ref 135–145)

## 2019-08-12 LAB — SARS CORONAVIRUS 2 (TAT 6-24 HRS): SARS Coronavirus 2: NEGATIVE

## 2019-08-12 NOTE — Progress Notes (Signed)

## 2019-08-14 NOTE — Progress Notes (Addendum)
Chart reviewed with Dr. Sabra Heck.  Will proceed with surgery as scheduled without any further clearance/testing needed.

## 2019-08-15 ENCOUNTER — Ambulatory Visit (HOSPITAL_BASED_OUTPATIENT_CLINIC_OR_DEPARTMENT_OTHER): Payer: Medicare Other | Admitting: Anesthesiology

## 2019-08-15 ENCOUNTER — Encounter (HOSPITAL_BASED_OUTPATIENT_CLINIC_OR_DEPARTMENT_OTHER)
Admission: RE | Disposition: A | Discharge status: Home / Self Care | Payer: Self-pay | Source: Home / Self Care | Attending: Orthopedic Surgery

## 2019-08-15 ENCOUNTER — Encounter (HOSPITAL_BASED_OUTPATIENT_CLINIC_OR_DEPARTMENT_OTHER): Payer: Self-pay | Admitting: Orthopedic Surgery

## 2019-08-15 ENCOUNTER — Ambulatory Visit (HOSPITAL_BASED_OUTPATIENT_CLINIC_OR_DEPARTMENT_OTHER)
Admission: RE | Admit: 2019-08-15 | Discharge: 2019-08-15 | Disposition: A | Discharge status: Home / Self Care | Payer: Medicare Other | Attending: Orthopedic Surgery | Admitting: Orthopedic Surgery

## 2019-08-15 DIAGNOSIS — K219 Gastro-esophageal reflux disease without esophagitis: Secondary | ICD-10-CM | POA: Diagnosis not present

## 2019-08-15 DIAGNOSIS — Z7951 Long term (current) use of inhaled steroids: Secondary | ICD-10-CM | POA: Diagnosis not present

## 2019-08-15 DIAGNOSIS — F419 Anxiety disorder, unspecified: Secondary | ICD-10-CM | POA: Diagnosis not present

## 2019-08-15 DIAGNOSIS — J45909 Unspecified asthma, uncomplicated: Secondary | ICD-10-CM | POA: Insufficient documentation

## 2019-08-15 DIAGNOSIS — Z7952 Long term (current) use of systemic steroids: Secondary | ICD-10-CM | POA: Diagnosis not present

## 2019-08-15 DIAGNOSIS — E785 Hyperlipidemia, unspecified: Secondary | ICD-10-CM | POA: Diagnosis not present

## 2019-08-15 DIAGNOSIS — G4700 Insomnia, unspecified: Secondary | ICD-10-CM | POA: Diagnosis not present

## 2019-08-15 DIAGNOSIS — Z79899 Other long term (current) drug therapy: Secondary | ICD-10-CM | POA: Diagnosis not present

## 2019-08-15 DIAGNOSIS — F329 Major depressive disorder, single episode, unspecified: Secondary | ICD-10-CM | POA: Diagnosis not present

## 2019-08-15 DIAGNOSIS — W1840XA Slipping, tripping and stumbling without falling, unspecified, initial encounter: Secondary | ICD-10-CM | POA: Insufficient documentation

## 2019-08-15 DIAGNOSIS — S52572A Other intraarticular fracture of lower end of left radius, initial encounter for closed fracture: Secondary | ICD-10-CM | POA: Insufficient documentation

## 2019-08-15 DIAGNOSIS — G8929 Other chronic pain: Secondary | ICD-10-CM | POA: Diagnosis not present

## 2019-08-15 DIAGNOSIS — Z87891 Personal history of nicotine dependence: Secondary | ICD-10-CM | POA: Insufficient documentation

## 2019-08-15 DIAGNOSIS — M797 Fibromyalgia: Secondary | ICD-10-CM | POA: Diagnosis not present

## 2019-08-15 DIAGNOSIS — Y92481 Parking lot as the place of occurrence of the external cause: Secondary | ICD-10-CM | POA: Diagnosis not present

## 2019-08-15 DIAGNOSIS — M549 Dorsalgia, unspecified: Secondary | ICD-10-CM | POA: Insufficient documentation

## 2019-08-15 DIAGNOSIS — K5900 Constipation, unspecified: Secondary | ICD-10-CM | POA: Insufficient documentation

## 2019-08-15 DIAGNOSIS — M069 Rheumatoid arthritis, unspecified: Secondary | ICD-10-CM | POA: Diagnosis not present

## 2019-08-15 DIAGNOSIS — Z7982 Long term (current) use of aspirin: Secondary | ICD-10-CM | POA: Insufficient documentation

## 2019-08-15 DIAGNOSIS — I10 Essential (primary) hypertension: Secondary | ICD-10-CM | POA: Diagnosis not present

## 2019-08-15 DIAGNOSIS — Z96641 Presence of right artificial hip joint: Secondary | ICD-10-CM | POA: Insufficient documentation

## 2019-08-15 HISTORY — PX: OPEN REDUCTION INTERNAL FIXATION (ORIF) DISTAL RADIAL FRACTURE: SHX5989

## 2019-08-15 SURGERY — OPEN REDUCTION INTERNAL FIXATION (ORIF) DISTAL RADIUS FRACTURE
Anesthesia: Monitor Anesthesia Care | Site: Wrist | Laterality: Left

## 2019-08-15 MED ORDER — FENTANYL CITRATE (PF) 100 MCG/2ML IJ SOLN: 25.0000 ug | INTRAMUSCULAR | Status: DC | PRN

## 2019-08-15 MED ORDER — ACETAMINOPHEN 500 MG PO TABS: 1000.0000 mg | ORAL_TABLET | Freq: Once | ORAL | Status: DC

## 2019-08-15 MED ORDER — CEFAZOLIN SODIUM-DEXTROSE 2-4 GM/100ML-% IV SOLN
2.0000 g | INTRAVENOUS | Status: AC
  Administered 2019-08-15: 2 g via INTRAVENOUS

## 2019-08-15 MED ORDER — PHENYLEPHRINE HCL (PRESSORS) 10 MG/ML IV SOLN
INTRAVENOUS | Status: DC | PRN
  Administered 2019-08-15: 80 ug via INTRAVENOUS
  Administered 2019-08-15: 120 ug via INTRAVENOUS
  Administered 2019-08-15: 80 ug via INTRAVENOUS
  Administered 2019-08-15 (×2): 40 ug via INTRAVENOUS

## 2019-08-15 MED ORDER — LIDOCAINE 2% (20 MG/ML) 5 ML SYRINGE
INTRAMUSCULAR | Status: AC
  Filled 2019-08-15: qty 5

## 2019-08-15 MED ORDER — ROPIVACAINE HCL 7.5 MG/ML IJ SOLN
INTRAMUSCULAR | Status: DC | PRN
Start: 2019-08-15 — End: 2019-08-15
  Administered 2019-08-15: 20 mL via PERINEURAL

## 2019-08-15 MED ORDER — SUCCINYLCHOLINE CHLORIDE 200 MG/10ML IV SOSY
PREFILLED_SYRINGE | INTRAVENOUS | Status: AC
  Filled 2019-08-15: qty 10

## 2019-08-15 MED ORDER — MIDAZOLAM HCL 2 MG/2ML IJ SOLN
1.0000 mg | INTRAMUSCULAR | Status: DC | PRN
  Administered 2019-08-15: 2 mg via INTRAVENOUS

## 2019-08-15 MED ORDER — HYDROCODONE-ACETAMINOPHEN 5-325 MG PO TABS: ORAL_TABLET | ORAL | 0 refills | Status: DC

## 2019-08-15 MED ORDER — FENTANYL CITRATE (PF) 100 MCG/2ML IJ SOLN
INTRAMUSCULAR | Status: AC
  Filled 2019-08-15: qty 2

## 2019-08-15 MED ORDER — CEFAZOLIN SODIUM-DEXTROSE 2-4 GM/100ML-% IV SOLN
INTRAVENOUS | Status: AC
  Filled 2019-08-15: qty 100

## 2019-08-15 MED ORDER — ONDANSETRON HCL 4 MG/2ML IJ SOLN
INTRAMUSCULAR | Status: AC
  Filled 2019-08-15: qty 2

## 2019-08-15 MED ORDER — DEXAMETHASONE SODIUM PHOSPHATE 10 MG/ML IJ SOLN
INTRAMUSCULAR | Status: DC | PRN
Start: 2019-08-15 — End: 2019-08-15
  Administered 2019-08-15: 5 mg

## 2019-08-15 MED ORDER — LACTATED RINGERS IV SOLN: INTRAVENOUS | Status: DC

## 2019-08-15 MED ORDER — EPHEDRINE 5 MG/ML INJ
INTRAVENOUS | Status: AC
  Filled 2019-08-15: qty 10

## 2019-08-15 MED ORDER — EPHEDRINE SULFATE-NACL 50-0.9 MG/10ML-% IV SOSY
PREFILLED_SYRINGE | INTRAVENOUS | Status: DC | PRN
  Administered 2019-08-15: 15 mg via INTRAVENOUS
  Administered 2019-08-15: 5 mg via INTRAVENOUS

## 2019-08-15 MED ORDER — MIDAZOLAM HCL 2 MG/2ML IJ SOLN
INTRAMUSCULAR | Status: AC
  Filled 2019-08-15: qty 2

## 2019-08-15 MED ORDER — FENTANYL CITRATE (PF) 100 MCG/2ML IJ SOLN
50.0000 ug | INTRAMUSCULAR | Status: DC | PRN
  Administered 2019-08-15: 50 ug via INTRAVENOUS

## 2019-08-15 MED ORDER — PHENYLEPHRINE 40 MCG/ML (10ML) SYRINGE FOR IV PUSH (FOR BLOOD PRESSURE SUPPORT)
PREFILLED_SYRINGE | INTRAVENOUS | Status: AC
  Filled 2019-08-15: qty 10

## 2019-08-15 MED ORDER — PROPOFOL 500 MG/50ML IV EMUL
INTRAVENOUS | Status: DC | PRN
  Administered 2019-08-15: 100 ug/kg/min via INTRAVENOUS

## 2019-08-15 MED ORDER — ONDANSETRON HCL 4 MG/2ML IJ SOLN
INTRAMUSCULAR | Status: DC | PRN
  Administered 2019-08-15: 4 mg via INTRAVENOUS

## 2019-08-15 SURGICAL SUPPLY — 63 items
APL PRP STRL LF DISP 70% ISPRP (MISCELLANEOUS) ×1
BIT DRILL 2.0 LNG QUCK RELEASE (BIT) IMPLANT
BIT DRILL 2.8 QUICK RELEASE (BIT) IMPLANT
BLADE SURG 15 STRL LF DISP TIS (BLADE) ×2 IMPLANT
BLADE SURG 15 STRL SS (BLADE) ×4
BNDG CMPR 9X4 STRL LF SNTH (GAUZE/BANDAGES/DRESSINGS) ×1
BNDG ELASTIC 3X5.8 VLCR STR LF (GAUZE/BANDAGES/DRESSINGS) ×2 IMPLANT
BNDG ESMARK 4X9 LF (GAUZE/BANDAGES/DRESSINGS) ×2 IMPLANT
BNDG GAUZE ELAST 4 BULKY (GAUZE/BANDAGES/DRESSINGS) ×2 IMPLANT
BNDG PLASTER X FAST 3X3 WHT LF (CAST SUPPLIES) ×20 IMPLANT
BNDG PLSTR 9X3 FST ST WHT (CAST SUPPLIES) ×10
CHLORAPREP W/TINT 26 (MISCELLANEOUS) ×2 IMPLANT
CORD BIPOLAR FORCEPS 12FT (ELECTRODE) ×2 IMPLANT
COVER BACK TABLE 60X90IN (DRAPES) ×2 IMPLANT
COVER MAYO STAND STRL (DRAPES) ×2 IMPLANT
COVER WAND RF STERILE (DRAPES) IMPLANT
CUFF TOURN SGL QUICK 18X4 (TOURNIQUET CUFF) IMPLANT
CUFF TOURN SGL QUICK 24 (TOURNIQUET CUFF)
CUFF TRNQT CYL 24X4X16.5-23 (TOURNIQUET CUFF) IMPLANT
DRAPE EXTREMITY T 121X128X90 (DISPOSABLE) ×2 IMPLANT
DRAPE OEC MINIVIEW 54X84 (DRAPES) ×2 IMPLANT
DRAPE SURG 17X23 STRL (DRAPES) ×2 IMPLANT
DRILL 2.0 LNG QUICK RELEASE (BIT) ×2
DRILL 2.8 QUICK RELEASE (BIT) ×2
GAUZE SPONGE 4X4 12PLY STRL (GAUZE/BANDAGES/DRESSINGS) ×2 IMPLANT
GAUZE XEROFORM 1X8 LF (GAUZE/BANDAGES/DRESSINGS) ×2 IMPLANT
GLOVE BIO SURGEON STRL SZ7.5 (GLOVE) ×2 IMPLANT
GLOVE BIOGEL PI IND STRL 8 (GLOVE) ×1 IMPLANT
GLOVE BIOGEL PI IND STRL 8.5 (GLOVE) IMPLANT
GLOVE BIOGEL PI INDICATOR 8 (GLOVE) ×1
GLOVE BIOGEL PI INDICATOR 8.5 (GLOVE)
GLOVE SURG ORTHO 8.0 STRL STRW (GLOVE) IMPLANT
GOWN STRL REUS W/ TWL LRG LVL3 (GOWN DISPOSABLE) ×1 IMPLANT
GOWN STRL REUS W/TWL LRG LVL3 (GOWN DISPOSABLE) ×2
GOWN STRL REUS W/TWL XL LVL3 (GOWN DISPOSABLE) ×2 IMPLANT
GUIDEWIRE ORTHO 0.054X6 (WIRE) ×3 IMPLANT
NDL HYPO 25X1 1.5 SAFETY (NEEDLE) IMPLANT
NEEDLE HYPO 25X1 1.5 SAFETY (NEEDLE) IMPLANT
NS IRRIG 1000ML POUR BTL (IV SOLUTION) ×2 IMPLANT
PAD CAST 3X4 CTTN HI CHSV (CAST SUPPLIES) ×1 IMPLANT
PADDING CAST COTTON 3X4 STRL (CAST SUPPLIES) ×2
PLATE ACU LOC PROX STD LEFT (Plate) ×1 IMPLANT
SCREW CORT FT 20X2.3XLCK HEX (Screw) IMPLANT
SCREW CORTICAL LOCKING 2.3X16M (Screw) ×2 IMPLANT
SCREW CORTICAL LOCKING 2.3X18M (Screw) ×4 IMPLANT
SCREW CORTICAL LOCKING 2.3X20M (Screw) ×4 IMPLANT
SCREW FX16X2.3XLCK SMTH NS CRT (Screw) IMPLANT
SCREW FX18X2.3XSMTH LCK NS CRT (Screw) IMPLANT
SCREW FX20X2.3XSMTH LCK NS CRT (Screw) IMPLANT
SCREW NLCKG 13 3.5X13 HEXA (Screw) IMPLANT
SCREW NON LOCK 3.5X10MM (Screw) ×1 IMPLANT
SCREW NON TOGG 2.3X16MM (Screw) ×1 IMPLANT
SCREW NON-LOCK 3.5X13 (Screw) ×2 IMPLANT
SCREW NONLOCK HEX 3.5X12 (Screw) ×1 IMPLANT
SET BASIN DAY SURGERY F.S. (CUSTOM PROCEDURE TRAY) ×2 IMPLANT
SLEEVE SCD COMPRESS KNEE MED (MISCELLANEOUS) IMPLANT
STOCKINETTE 4X48 STRL (DRAPES) ×2 IMPLANT
SUT ETHILON 4 0 PS 2 18 (SUTURE) ×2 IMPLANT
SUT VICRYL 4-0 PS2 18IN ABS (SUTURE) ×2 IMPLANT
SYR BULB EAR ULCER 3OZ GRN STR (SYRINGE) ×2 IMPLANT
SYR CONTROL 10ML LL (SYRINGE) IMPLANT
TOWEL GREEN STERILE FF (TOWEL DISPOSABLE) ×4 IMPLANT
UNDERPAD 30X36 HEAVY ABSORB (UNDERPADS AND DIAPERS) ×2 IMPLANT

## 2019-08-15 NOTE — H&P (Signed)
Vanessa Harmon is an 69 y.o. female.   Chief Complaint: left wrist fracture HPI: 69 year old right-hand-dominant female states she tripped in a parking lot 1 week ago injuring her left wrist.  She was seen at [facility] where radiographs were taken revealing a distal radius fracture.  She was splinted and followed up in the office.  She wishes to proceed with operative fixation.  Allergies:  Allergies  Allergen Reactions  . Breo Ellipta [Fluticasone Furoate-Vilanterol] Other (See Comments)    Severe hoarseness   . Fosamax [Alendronate Sodium] Other (See Comments)    myalgias  . Levaquin [Levofloxacin] Other (See Comments)    Lightheadedness, not feeling well, ear pain  . Infliximab Other (See Comments)    Skin reaction  . Spiriva Handihaler [Tiotropium Bromide Monohydrate]     UNSPECIFIED REACTION   . Verapamil   . Tape Rash    PAPER TAPE: Causes severe rash and weeping    Past Medical History:  Diagnosis Date  . Adenomatous polyp   . Anxiety    takes Ativan daily as needed  . Arthritis    inflammatory arthritis (Dr. Dagoberto Ligas)  . Asthma   . Chronic back pain    scoliosis   . Constipation    takes miralax every other day  . Depression    takes Lexapro daily  . Dysrhythmia   . Eczema   . Essential hypertension, benign    takes Hyzaar and Metoprolol daily  . Fibromyalgia   . GERD (gastroesophageal reflux disease)   . History of blood transfusion   . History of bronchitis 5 rys ago  . History of colon polyps   . History of shingles   . Hyperlipidemia    takes Atorvastatin daily  . Insomnia    takes Melatonin nightly  . Joint pain   . Osteoarthritis   . Pneumonia as a child  . PSVT (paroxysmal supraventricular tachycardia) ([place])   . Recurrent upper respiratory infection (URI)   . Rheumatoid arthritis ([place])   . Rhinitis, allergic    uses Flonase daily  . Urinary urgency   . Urticaria     Past Surgical History:  Procedure Laterality Date   . ADENOIDECTOMY    . APPENDECTOMY    . BACK SURGERY  2012   at age 35 d/t scoliosis  . BLEPHAROPLASTY  2018  . BREAST BIOPSY    . cataract surgery    . COLONOSCOPY    . KNEE ARTHROSCOPY Bilateral   . neuroma removed from foot     unsure of which foot  . POSTERIOR LUMBAR FUSION 4 LEVEL N/A 02/15/2018   Procedure: Decompression and fusion Thoracic nine to Lumbar two with exploration of previous fusion;  Surgeon: Erline Levine, MD;  Location: [facility];  Service: Neurosurgery;  Laterality: N/A;  . SHOULDER SURGERY Right   . TONSILLECTOMY    . TOTAL HIP ARTHROPLASTY Right 04/15/2014   Procedure: RIGHT TOTAL HIP ARTHROPLASTY ANTERIOR APPROACH;  Surgeon: Renette Butters, MD;  Location: [facility];  Service: Orthopedics;  Laterality: Right;    Family History: Family History  Problem Relation Age of Onset  . Cancer Mother   . Stroke Mother   . Allergic rhinitis Mother   . Allergic rhinitis Father   . AAA (abdominal aortic aneurysm) Father   . COPD Father   . Hypertension Other        unspecified grandmother  . AAA (abdominal aortic aneurysm) Other  unspecified grandfather  . Cancer Other        unspecified grandfather  . Neuropathy Neg Hx     Social History:   reports that she quit smoking about 23 years ago. She has never used smokeless tobacco. She reports current alcohol use. She reports that she does not use drugs.  Medications: Facility-Administered Medications Prior to Admission  Medication Dose Route Frequency Provider Last Rate Last Admin  . Benralizumab SOSY 30 mg  30 mg Subcutaneous Q8 Weeks Kennith Gain, MD   30 mg at 04/04/17 1823   Medications Prior to Admission  Medication Sig Dispense Refill  . Abatacept (ORENCIA IV) Inject into the vein.    Marland Kitchen acetaminophen (TYLENOL) 650 MG CR tablet Take 650 mg by mouth every 8 (eight) hours as needed for pain.    Marland Kitchen albuterol (PROAIR HFA) 108 (90 Base) MCG/ACT inhaler Inhale two puffs every 4-6 hours if needed for  cough or wheeze. 8.5 Inhaler 1  . Alpha-Lipoic Acid 600 MG CAPS Take 1 capsule (600 mg total) by mouth daily. 30 capsule 0  . aspirin 81 MG tablet Take 81 mg by mouth daily.    Marland Kitchen atorvastatin (LIPITOR) 80 MG tablet TAKE 1/2 BY MOUTH ONCE DAILY (Patient taking differently: Take 80 mg by mouth daily. ) 90 tablet 1  . beclomethasone (QVAR) 80 MCG/ACT inhaler Inhale into the lungs 2 (two) times daily.    . carboxymethylcellulose (REFRESH PLUS) 0.5 % SOLN 1 drop 3 (three) times daily as needed.    . chlorthalidone (HYGROTON) 25 MG tablet Take 12.5 mg by mouth daily.    . Cholecalciferol (VITAMIN D3 PO) Take 50 mcg by mouth daily.    Marland Kitchen dextromethorphan-guaiFENesin (MUCINEX DM) 30-600 MG 12hr tablet Take 1 tablet by mouth daily as needed for cough.    . escitalopram (LEXAPRO) 20 MG tablet TAKE 1 TABLET BY MOUTH DAILY 90 tablet 3  . famotidine (PEPCID) 40 MG tablet TAKE 1 TABLET (40 MG TOTAL) BY MOUTH AT BEDTIME. 30 tablet 5  . fluticasone (FLONASE) 50 MCG/ACT nasal spray Use one spray in each nostril twice daily 16 g 5  . furosemide (LASIX) 20 MG tablet TAKE 1 TABLET (20 MG TOTAL) BY MOUTH DAILY. (Patient taking differently: Take 10 mg by mouth daily. ) 90 tablet 1  . gabapentin (NEURONTIN) 300 MG capsule Take 300 mg by mouth 3 (three) times daily.    Marland Kitchen HYDROcodone-acetaminophen (NORCO/VICODIN) 5-325 MG tablet Take 1 tablet by mouth every 6 (six) hours as needed. 10 tablet 0  . indomethacin (INDOCIN SR) 75 MG CR capsule TAKE 1 CAPSULE BY MOUTH ONCE DAILY 90 capsule 2  . irbesartan (AVAPRO) 300 MG tablet Take 300 mg by mouth daily.     Marland Kitchen Lifitegrast (XIIDRA) 5 % SOLN Apply to eye in the morning and at bedtime.    . montelukast (SINGULAIR) 10 MG tablet Take 1 tablet (10 mg total) by mouth at bedtime. 30 tablet 5  . Multiple Vitamin (MULTIVITAMIN) capsule Take 1 capsule by mouth daily.    . Omega-3 Fatty Acids (FISH OIL PO) Take 1,000 mg by mouth daily.    Marland Kitchen omeprazole (PRILOSEC) 40 MG capsule TAKE ONE  CAPSULE BY MOUTH DAILY BEFORE BREAKFAST DAILY 30 capsule 4  . predniSONE (DELTASONE) 5 MG tablet Take 10 mg by mouth daily with breakfast.     . pseudoephedrine (SUDAFED) 30 MG tablet Take 30 mg by mouth daily as needed for congestion.     . traZODone (DESYREL) 50  MG tablet Take 100 mg by mouth at bedtime.     . valACYclovir (VALTREX) 1000 MG tablet Take 1,000 mg by mouth daily.     . vitamin C (ASCORBIC ACID) 500 MG tablet Take 1,000 mg by mouth daily.     . cetirizine (ZYRTEC) 10 MG tablet Take 10 mg by mouth daily as needed for allergies.    . polyethylene glycol (MIRALAX / GLYCOLAX) packet Take 17 g by mouth daily.      No results found for this or any previous visit (from the past 48 hour(s)).  No results found.   A comprehensive review of systems was negative.  Blood pressure (!) 94/48, pulse (!) 59, temperature (!) 97.3 F (36.3 C), temperature source Temporal, resp. rate 13, height 5\' 6"  (1.676 m), weight 76.3 kg, SpO2 95 %.  General appearance: alert, cooperative and appears stated age Head: Normocephalic, without obvious abnormality, atraumatic Neck: supple, symmetrical, trachea midline Cardio: regular rate and rhythm Resp: clear to auscultation bilaterally Extremities: Intact sensation and capillary refill all digits.  +epl/fpl/io.  No wounds.  Pulses: 2+ and symmetric Skin: Skin color, texture, turgor normal. No rashes or lesions Neurologic: Grossly normal Incision/Wound: none  Assessment/Plan Left distal radius fracture.  Plan open reduction internal fixation.  Non operative and operative treatment options have been discussed with the patient and patient wishes to proceed with operative treatment. Risks, benefits, and alternatives of surgery have been discussed and the patient agrees with the plan of care.   Leanora Cover 08/15/2019, 12:07 PM

## 2019-08-15 NOTE — Progress Notes (Signed)
Assisted Dr. Lanetta Inch with left, ultrasound guided, supraclavicular block. Side rails up, monitors on throughout procedure. See vital signs in flow sheet. Tolerated Procedure well.

## 2019-08-15 NOTE — Anesthesia Postprocedure Evaluation (Signed)
Anesthesia Post Note  Patient: Vanessa Harmon  Procedure(s) Performed: OPEN REDUCTION INTERNAL FIXATION (ORIF) DISTAL RADIAL FRACTURE (Left Wrist)     Patient location during evaluation: PACU Anesthesia Type: Regional and General Level of consciousness: awake and alert Pain management: pain level controlled Vital Signs Assessment: post-procedure vital signs reviewed and stable Respiratory status: spontaneous breathing, nonlabored ventilation, respiratory function stable and patient connected to nasal cannula oxygen Cardiovascular status: blood pressure returned to baseline and stable Postop Assessment: no apparent nausea or vomiting Anesthetic complications: no    Last Vitals:  Vitals:   08/15/19 1400 08/15/19 1430  BP: 130/66 (!) 143/64  Pulse: 77 77  Resp: 17 16  Temp:  36.5 C  SpO2: 98% 96%    Last Pain:  Vitals:   08/15/19 1430  TempSrc: Oral  PainSc: 0-No pain                 Dominic Mahaney

## 2019-08-15 NOTE — Op Note (Signed)
08/15/2019 [facility]  Operative Note  Pre Op Diagnosis: Left comminuted intraarticular distal radius fracture  Post Op Diagnosis: Left comminuted intraarticular distal radius fracture  Procedure:  1. ORIF Left comminuted intraarticular distal radius fracture, 3 intraarticular fragments 2. Left brachioradialis release  Surgeon: Leanora Cover, MD  Assistant: Daryll Brod, MD  Anesthesia: Regional with sedation  Fluids: Per anesthesia flow sheet  EBL: minimal  Complications: None  Specimen: None  Tourniquet Time:  Total Tourniquet Time Documented: Upper Arm (Left) - 57 minutes Total: Upper Arm (Left) - 57 minutes   Disposition: Stable to PACU  INDICATIONS:  Vanessa Harmon is a 69 y.o. female states she fell in a parking lot one week ago injuring her left wrist.  Seen at [facility] where XR revealed left distal radius fracture.  Splinted and followed up in office.  We discussed nonoperative and operative treatment options.  She wished to proceed with operative fixation.  Risks, benefits, and alternatives of surgery were discussed including the risk of blood loss; infection; damage to nerves, vessels, tendons, ligaments, bone; failure of surgery; need for additional surgery; complications with wound healing; continued pain; nonunion; malunion; stiffness.  We also discussed the possible need for bone graft and the benefits and risks including the possibility of disease transmission.  She voiced understanding of these risks and elected to proceed.    OPERATIVE COURSE:  After being identified preoperatively by myself, the patient and I agreed upon the procedure and site of procedure.  Surgical site was marked.  The risks, benefits and alternatives of the surgery were reviewed and she wished to proceed.  Surgical consent had been signed.  She was given IV Ancef as preoperative antibiotic prophylaxis.  She was transferred to the operating room and placed on the operating room  table in supine position with the Left upper extremity on an armboard. Sedation was induced by the anesthesiologist.  A regional block had been performed by anesthesia in preoperative holding.  The Left upper extremity was prepped and draped in normal sterile orthopedic fashion.  A surgical pause was performed between the surgeons, anesthesia and operating room staff, and all were in agreement as to the patient, procedure and site of procedure.  Tourniquet at the proximal aspect of the extremity was inflated to 250 mmHg after exsanguination of the limb with an Esmarch bandage.  Standard volar Mallie Mussel approach was used.  The bipolar electrocautery was used to obtain hemostasis.  The superficial and deep portions of the FCR tendon sheath were incised, and the FCR and FPL were swept ulnarly to protect the palmar cutaneous branch of the median nerve.  The brachioradialis was released at the radial side of the radius.  The pronator quadratus was released and elevated with the periosteal elevator.  The fracture site was identified and cleared of soft tissue interposition and hematoma.  It was reduced under direct visualization.  There was intraarticular extension creating three intraarticular fragments. The radial styloid fragment was comminuted.  An AcuMed volar distal radial locking plate was selected.  It was secured to the bone with the guidepins.  C-arm was used in AP and lateral projections to ensure appropriate reduction and position of the hardware and adjustments made as necessary.  Standard AO drilling and measuring technique was used.  A single screw was placed in the slotted hole in the shaft of the plate.  The distal holes were filled with locking pegs with the exception of the styloid holes, which were filled with locking  screws.  The remaining holes in the shaft of the plate were filled with nonlocking screws.  Good purchase was obtained.  C-arm was used in AP, lateral and oblique projections to ensure  appropriate reduction and position of hardware, which was the case.  There was no intra-articular penetration of hardware.  The wound was copiously irrigated with sterile saline.  Pronator quadratus was repaired back over top of the plate using 4-0 Vicryl suture.  Vicryl suture was placed in the subcutaneous tissues in an inverted interrupted fashion and the skin was closed with 4-0 nylon in a horizontal mattress fashion.  There was good pronation and supination of the wrist without crepitance.  The wound was then dressed with sterile Xeroform, 4x4s, and wrapped with a Kerlix bandage.  A volar splint was placed and wrapped with Kerlix and Ace bandage.  Tourniquet was deflated at 57 minutes.  Fingertips were pink with brisk capillary refill after deflation of the tourniquet.  Operative drapes were broken down.  The patient was awoken from anesthesia safely.  She was transferred back to the stretcher and taken to the PACU in stable condition.  I will see her back in the office in one week for postoperative followup.  I will give her a prescription for Norco 5/325 1-2 tabs PO q6 hours prn pain, dispense # 30.    Leanora Cover, MD Electronically signed, 08/15/19

## 2019-08-15 NOTE — Op Note (Signed)
I assisted Surgeon(s) and Role:    * Leanora Cover, MD - Primary    Daryll Brod, MD - Assisting on the Procedure(s): OPEN REDUCTION INTERNAL FIXATION (ORIF) DISTAL RADIAL FRACTURE on 08/15/2019.  I provided assistance on this case as follows: setup. approach, debridement, reduction, stabilization and fixation of the the fracture, closure of the wound and application of the dressings and splint.  Electronically signed by: Daryll Brod, MD Date: 08/15/2019 Time: 1:44 PM

## 2019-08-15 NOTE — Anesthesia Procedure Notes (Signed)
Anesthesia Regional Block: Supraclavicular block   Pre-Anesthetic Checklist: ,, timeout performed, Correct Patient, Correct Site, Correct Laterality, Correct Procedure, Correct Position, site marked, Risks and benefits discussed,  Surgical consent,  Pre-op evaluation,  At surgeon's request and post-op pain management  Laterality: Left  Prep: Maximum Sterile Barrier Precautions used, chloraprep       Needles:  Injection technique: Single-shot  Needle Type: Echogenic Stimulator Needle     Needle Length: 4cm  Needle Gauge: 22     Additional Needles:   Procedures:,,,, ultrasound used (permanent image in chart),,,,  Narrative:  Start time: 08/15/2019 11:29 AM End time: 08/15/2019 11:39 AM Injection made incrementally with aspirations every 5 mL.  Performed by: Personally  Anesthesiologist: Freddrick March, MD  Additional Notes: Monitors applied. No increased pain on injection. No increased resistance to injection. Injection made in 5cc increments. Good needle visualization. Patient tolerated procedure well.

## 2019-08-15 NOTE — Discharge Instructions (Addendum)

## 2019-08-15 NOTE — Anesthesia Preprocedure Evaluation (Addendum)
Anesthesia Evaluation  Patient identified by MRN, date of birth, ID band Patient awake    Reviewed: Allergy & Precautions, NPO status , Patient's Chart, lab work & pertinent test results  Airway Mallampati: II  TM Distance: >3 FB Neck ROM: Full    Dental no notable dental hx. (+) Teeth Intact, Dental Advisory Given   Pulmonary asthma , former smoker,    Pulmonary exam normal breath sounds clear to auscultation       Cardiovascular hypertension, Pt. on medications Normal cardiovascular exam+ dysrhythmias Supra Ventricular Tachycardia  Rhythm:Regular Rate:Normal  HLD  TTE 2014 EF 55-60%, G1DD, mild MR   Neuro/Psych PSYCHIATRIC DISORDERS Anxiety Depression negative neurological ROS     GI/Hepatic Neg liver ROS, GERD  Medicated and Controlled,  Endo/Other  negative endocrine ROS  Renal/GU negative Renal ROS  negative genitourinary   Musculoskeletal  (+) Arthritis , Rheumatoid disorders,  Fibromyalgia -, narcotic dependent  Abdominal   Peds  Hematology negative hematology ROS (+)   Anesthesia Other Findings On prednisone 64m  Reproductive/Obstetrics                            Anesthesia Physical Anesthesia Plan  ASA: III  Anesthesia Plan: MAC and Regional   Post-op Pain Management:  Regional for Post-op pain   Induction: Intravenous  PONV Risk Score and Plan: 2 and Propofol infusion, Treatment may vary due to age or medical condition and Ondansetron  Airway Management Planned: Natural Airway  Additional Equipment:   Intra-op Plan:   Post-operative Plan:   Informed Consent: I have reviewed the patients History and Physical, chart, labs and discussed the procedure including the risks, benefits and alternatives for the proposed anesthesia with the patient or authorized representative who has indicated his/her understanding and acceptance.     Dental advisory given  Plan  Discussed with: CRNA  Anesthesia Plan Comments:         Anesthesia Quick Evaluation

## 2019-08-15 NOTE — Transfer of Care (Signed)
Immediate Anesthesia Transfer of Care Note  Patient: Vanessa Harmon  Procedure(s) Performed: OPEN REDUCTION INTERNAL FIXATION (ORIF) DISTAL RADIAL FRACTURE (Left Wrist)  Patient Location: PACU  Anesthesia Type:MAC combined with regional for post-op pain  Level of Consciousness: awake, alert , oriented and patient cooperative  Airway & Oxygen Therapy: Patient Spontanous Breathing and Patient connected to face mask oxygen  Post-op Assessment: Report given to RN and Post -op Vital signs reviewed and stable  Post vital signs: Reviewed and stable  Last Vitals:  Vitals Value Taken Time  BP 109/55 08/15/19 1348  Temp    Pulse 78 08/15/19 1350  Resp 17 08/15/19 1350  SpO2 97 % 08/15/19 1350  Vitals shown include unvalidated device data.  Last Pain:  Vitals:   08/15/19 1039  TempSrc: Temporal  PainSc: 0-No pain         Complications: No apparent anesthesia complications

## 2019-11-12 NOTE — H&P (Signed)
TOTAL KNEE ADMISSION H&P  Patient is being admitted for left total knee arthroplasty.  Subjective:  Chief Complaint:   Left knee OA / pain  HPI: Vanessa Harmon, 69 y.o. female, has a history of pain and functional disability in the left knee due to arthritis and has failed non-surgical conservative treatments for greater than 12 weeks to include NSAID's and/or analgesics, corticosteriod injections and activity modification.  Onset of symptoms was gradual, starting years ago with gradually worsening course since that time. The patient noted prior procedures on the knee to include  arthroscopy on the left knee(s).  Patient currently rates pain in the left knee(s) at 5 out of 10 constantly. Patient has worsening of pain with activity and weight bearing, pain that interferes with activities of daily living, pain with passive range of motion, crepitus and joint swelling.  Patient has evidence of periarticular osteophytes and joint space narrowing by imaging studies.  There is no active infection.  Risks, benefits and expectations were discussed with the patient.  Risks including but not limited to the risk of anesthesia, blood clots, nerve damage, blood vessel damage, failure of the prosthesis, infection and up to and including death.  Patient understand the risks, benefits and expectations and wishes to proceed with surgery.   PCP: Juanell Fairly, MD  D/C Plans:       Home   Post-op Meds:       No Rx given   Tranexamic Acid:      To be given - IV   Decadron:      Is to be given  FYI:      ASA  Norco  DME:   Pt already has equipment   PT:   [place]: Rober Minion    Patient Active Problem List   Diagnosis Date Noted  . Numbness in feet 02/07/2019  . Idiopathic scoliosis of thoracolumbar region 02/15/2018  . Asthma with acute exacerbation 05/10/2016  . Acute sinusitis 05/10/2016  . Chronic sinusitis 12/08/2015  . Polymyalgia rheumatica ([place]) 02/15/2015  . DJD (degenerative  joint disease) 04/15/2014  . Lumbar spine scoliosis 11/28/2013  . Impaired glucose tolerance 12/19/2011  . Lumbar back pain 12/19/2011  . Non-seasonal allergic rhinitis 12/19/2011  . Hyperlipidemia 10/16/2010  . Depression 10/16/2010  . Lumbar spondylosis 10/16/2010  . GE reflux 10/16/2010  . Adenomatous colon polyp 10/16/2010  . History of paroxysmal supraventricular tachycardia 10/16/2010  . Hypertension 10/16/2010   Past Medical History:  Diagnosis Date  . Adenomatous polyp   . Anxiety    takes Ativan daily as needed  . Arthritis    inflammatory arthritis (Dr. Dagoberto Ligas)  . Asthma   . Chronic back pain    scoliosis   . Constipation    takes miralax every other day  . Depression    takes Lexapro daily  . Dysrhythmia   . Eczema   . Essential hypertension, benign    takes Hyzaar and Metoprolol daily  . Fibromyalgia   . GERD (gastroesophageal reflux disease)   . History of blood transfusion   . History of bronchitis 5 rys ago  . History of colon polyps   . History of shingles   . Hyperlipidemia    takes Atorvastatin daily  . Insomnia    takes Melatonin nightly  . Joint pain   . Osteoarthritis   . Pneumonia as a child  . PSVT (paroxysmal supraventricular tachycardia) ([place])   . Recurrent upper respiratory infection (URI)   .  Rheumatoid arthritis ([place])   . Rhinitis, allergic    uses Flonase daily  . Urinary urgency   . Urticaria     Past Surgical History:  Procedure Laterality Date  . ADENOIDECTOMY    . APPENDECTOMY    . BACK SURGERY  2012   at age 88 d/t scoliosis  . BLEPHAROPLASTY  2018  . BREAST BIOPSY    . cataract surgery    . COLONOSCOPY    . KNEE ARTHROSCOPY Bilateral   . neuroma removed from foot     unsure of which foot  . OPEN REDUCTION INTERNAL FIXATION (ORIF) DISTAL RADIAL FRACTURE Left 08/15/2019   Procedure: OPEN REDUCTION INTERNAL FIXATION (ORIF) DISTAL RADIAL FRACTURE;  Surgeon: Leanora Cover, MD;  Location: [place];  Service: Orthopedics;  Laterality: Left;  block in preop  . POSTERIOR LUMBAR FUSION 4 LEVEL N/A 02/15/2018   Procedure: Decompression and fusion Thoracic nine to Lumbar two with exploration of previous fusion;  Surgeon: Erline Levine, MD;  Location: [place];  Service: Neurosurgery;  Laterality: N/A;  . SHOULDER SURGERY Right   . TONSILLECTOMY    . TOTAL HIP ARTHROPLASTY Right 04/15/2014   Procedure: RIGHT TOTAL HIP ARTHROPLASTY ANTERIOR APPROACH;  Surgeon: Renette Butters, MD;  Location: [place];  Service: Orthopedics;  Laterality: Right;    No current facility-administered medications for this encounter.   Current Outpatient Medications  Medication Sig Dispense Refill Last Dose  . abatacept (ORENCIA) 250 MG injection Inject into the vein every 30 (thirty) days.      Marland Kitchen albuterol (PROAIR HFA) 108 (90 Base) MCG/ACT inhaler Inhale two puffs every 4-6 hours if needed for cough or wheeze. (Patient taking differently: Inhale 2 puffs into the lungs every 4 (four) hours as needed for wheezing or shortness of breath. Inhale two puffs every 4-6 hours if needed for cough or wheeze.) 8.5 Inhaler 1   . aspirin 81 MG tablet Take 81 mg by mouth daily.     Marland Kitchen atorvastatin (LIPITOR) 80 MG tablet TAKE 1/2 BY MOUTH ONCE DAILY (Patient taking differently: Take 80 mg by mouth at bedtime. ) 90 tablet 1   . beclomethasone (QVAR) 80 MCG/ACT inhaler Inhale 2 puffs into the lungs 2 (two) times daily.      . carboxymethylcellulose (REFRESH PLUS) 0.5 % SOLN Place 1 drop into both eyes 3 (three) times daily as needed (dry eyes).      . chlorthalidone (HYGROTON) 25 MG tablet Take 12.5 mg by mouth daily.     . Cholecalciferol (VITAMIN D3 PO) Take 50 mcg by mouth daily.     Marland Kitchen denosumab (PROLIA) 60 MG/ML SOSY injection Inject 60 mg into the skin every 6 (six) months.     . dextromethorphan-guaiFENesin (MUCINEX DM) 30-600 MG 12hr tablet Take 1 tablet by mouth daily as needed for cough.     . diphenhydrAMINE (BENADRYL) 25  MG tablet Take 25 mg by mouth daily as needed for allergies.     Marland Kitchen escitalopram (LEXAPRO) 20 MG tablet TAKE 1 TABLET BY MOUTH DAILY (Patient taking differently: Take 20 mg by mouth daily. ) 90 tablet 3   . famotidine (PEPCID) 40 MG tablet TAKE 1 TABLET (40 MG TOTAL) BY MOUTH AT BEDTIME. 30 tablet 5   . fluticasone (FLONASE) 50 MCG/ACT nasal spray Use one spray in each nostril twice daily (Patient taking differently: Place 1 spray into both nostrils in the morning and at bedtime. Use one spray in each nostril twice daily) 16 g  5   . furosemide (LASIX) 20 MG tablet TAKE 1 TABLET (20 MG TOTAL) BY MOUTH DAILY. (Patient taking differently: Take 10 mg by mouth daily. ) 90 tablet 1   . gabapentin (NEURONTIN) 400 MG capsule Take 400 mg by mouth 3 (three) times daily.      . indomethacin (INDOCIN SR) 75 MG CR capsule TAKE 1 CAPSULE BY MOUTH ONCE DAILY (Patient taking differently: Take 75 mg by mouth daily. ) 90 capsule 2   . irbesartan (AVAPRO) 300 MG tablet Take 300 mg by mouth daily.      Marland Kitchen Lifitegrast (XIIDRA) 5 % SOLN Place 1 drop into both eyes in the morning and at bedtime.      . montelukast (SINGULAIR) 10 MG tablet Take 1 tablet (10 mg total) by mouth at bedtime. 30 tablet 5   . Multiple Vitamin (MULTIVITAMIN) capsule Take 1 capsule by mouth daily.     . Olopatadine HCl 0.6 % SOLN Place 2 sprays into the nose in the morning and at bedtime.     . Omega-3 Fatty Acids (FISH OIL PO) Take 1,000 mg by mouth daily.     Marland Kitchen omeprazole (PRILOSEC) 40 MG capsule TAKE ONE CAPSULE BY MOUTH DAILY BEFORE BREAKFAST DAILY (Patient taking differently: Take 40 mg by mouth daily. TAKE ONE CAPSULE BY MOUTH DAILY BEFORE BREAKFAST DAILY) 30 capsule 4   . predniSONE (DELTASONE) 5 MG tablet Take 5 mg by mouth daily with breakfast.      . pseudoephedrine (SUDAFED) 30 MG tablet Take 30 mg by mouth daily as needed for congestion.      . traZODone (DESYREL) 50 MG tablet Take 100 mg by mouth at bedtime.      . valACYclovir  (VALTREX) 1000 MG tablet Take 1,000 mg by mouth daily.      . vitamin C (ASCORBIC ACID) 500 MG tablet Take 1,000 mg by mouth daily.       Allergies  Allergen Reactions  . Breo Ellipta [Fluticasone Furoate-Vilanterol] Other (See Comments)    Severe hoarseness   . Fosamax [Alendronate Sodium] Other (See Comments)    myalgias  . Levaquin [Levofloxacin] Other (See Comments)    Lightheadedness, not feeling well, ear pain  . Infliximab Other (See Comments)    Skin reaction  . Spiriva Handihaler [Tiotropium Bromide Monohydrate]     UNSPECIFIED REACTION   . Verapamil   . Tape Rash    PAPER TAPE: Causes severe rash and weeping    Social History   Tobacco Use  . Smoking status: Former Smoker    Quit date: 1998    Years since quitting: 23.5  . Smokeless tobacco: Never Used  . Tobacco comment: hasn't smoked in 80yrs  Substance Use Topics  . Alcohol use: Yes    Comment: rarely    Family History  Problem Relation Age of Onset  . Cancer Mother   . Stroke Mother   . Allergic rhinitis Mother   . Allergic rhinitis Father   . AAA (abdominal aortic aneurysm) Father   . COPD Father   . Hypertension Other        unspecified grandmother  . AAA (abdominal aortic aneurysm) Other        unspecified grandfather  . Cancer Other        unspecified grandfather  . Neuropathy Neg Hx        Review of Systems  Constitutional: Negative.   HENT: Negative.   Eyes: Negative.   Respiratory: Negative.   Cardiovascular: Negative.  Gastrointestinal: Negative.   Genitourinary: Negative.   Musculoskeletal: Positive for back pain and joint pain.  Skin: Negative.   Neurological: Negative.   Endo/Heme/Allergies: Negative.   Psychiatric/Behavioral: Negative.        Objective:  Physical Exam Constitutional:      Appearance: She is well-developed.  HENT:     Head: Normocephalic.  Eyes:     Pupils: Pupils are equal, round, and reactive to light.  Neck:     Thyroid: No thyromegaly.      Vascular: No JVD.     Trachea: No tracheal deviation.  Cardiovascular:     Rate and Rhythm: Normal rate and regular rhythm.  Pulmonary:     Effort: Pulmonary effort is normal. No respiratory distress.     Breath sounds: Normal breath sounds. No wheezing.  Abdominal:     Palpations: Abdomen is soft.     Tenderness: There is no abdominal tenderness. There is no guarding.  Musculoskeletal:     Cervical back: Neck supple.     Left knee: Swelling and bony tenderness present. No erythema or ecchymosis. Decreased range of motion. Tenderness present.  Lymphadenopathy:     Cervical: No cervical adenopathy.  Skin:    General: Skin is warm and dry.  Neurological:     Mental Status: She is alert and oriented to person, place, and time.     Sensory: Sensory deficit (neuropathy in the feet) present.       Labs:  Estimated body mass index is 27.15 kg/m as calculated from the following:   Height as of 08/15/19: 5\' 6"  (1.676 m).   Weight as of 08/15/19: 76.3 kg.   Imaging Review Plain radiographs demonstrate severe degenerative joint disease of the left knee.  The bone quality appears to be good for age and reported activity level.      Assessment/Plan:  End stage arthritis, left knee   The patient history, physical examination, clinical judgment of the provider and imaging studies are consistent with end stage degenerative joint disease of the left knee(s) and total knee arthroplasty is deemed medically necessary. The treatment options including medical management, injection therapy arthroscopy and arthroplasty were discussed at length. The risks and benefits of total knee arthroplasty were presented and reviewed. The risks due to aseptic loosening, infection, stiffness, patella tracking problems, thromboembolic complications and other imponderables were discussed. The patient acknowledged the explanation, agreed to proceed with the plan and consent was signed. Patient is being admitted for  treatment for surgery, pain control, PT, OT, prophylactic antibiotics, VTE prophylaxis, progressive ambulation and ADL's and discharge planning. The patient is planning to be discharged home.     Patient's anticipated LOS is less than 2 midnights, meeting these requirements: - Lives within 1 hour of care - Has a competent adult at home to recover with post-op recover - NO history of  - Chronic pain requiring opiods  - Diabetes  - Coronary Artery Disease  - Heart failure  - Heart attack  - Stroke  - DVT/VTE  - Cardiac arrhythmia  - Respiratory Failure/COPD  - Renal failure  - Anemia  - Advanced Liver disease

## 2019-11-20 NOTE — Patient Instructions (Addendum)
DUE TO COVID-19 ONLY ONE VISITOR IS ALLOWED TO COME WITH YOU AND STAY IN THE WAITING ROOM ONLY DURING PRE OP AND PROCEDURE DAY OF SURGERY. THE 2 VISITORS MAY VISIT WITH YOU AFTER SURGERY IN YOUR PRIVATE ROOM DURING VISITING HOURS ONLY!  YOU NEED TO HAVE A COVID 19 TEST ON: 11/22/19@        , THIS TEST MUST BE DONE BEFORE SURGERY,  COVID TESTING SITE [address] [place] [address], IT IS ON THE RIGHT GOING OUT [address] APPROXIMATELY  2 MINUTES PAST [employer] ON THE RIGHT. ONCE YOUR COVID TEST IS COMPLETED,  PLEASE BEGIN THE QUARANTINE INSTRUCTIONS AS OUTLINED IN YOUR HANDOUT.                CA[facility]    Your procedure is scheduled on: 11/26/19   Report to [facility] Main  Entrance   Report to admitting at: 7:30 AM     Call this number if you have problems the morning of surgery [phone]    Remember:   NO SOLID FOOD AFTER MIDNIGHT THE NIGHT PRIOR TO SURGERY. NOTHING BY MOUTH EXCEPT CLEAR LIQUIDS UNTIL: 7:00 am . PLEASE FINISH ENSURE DRINK PER SURGEON ORDER  WHICH NEEDS TO BE COMPLETED AT: 7:00 am .   CLEAR LIQUID DIET   Foods Allowed                                                                                           Foods Excluded  Coffee and tea, regular and decaf no creamer                                      liquids that you cannot  Plain Jell-O any favor except red or purple                                           see through such as: Fruit ices (not with fruit pulp)                                                       milk, soups, orange juice  Iced Popsicles                                                          All solid food Carbonated beverages, regular and diet                                    Cranberry, grape and apple juices  Sports drinks like Gatorade Lightly seasoned clear broth or consume(fat free) Sugar, honey syrup   _____________________________________________________________________   BRUSH YOUR  TEETH MORNING OF SURGERY AND RINSE YOUR MOUTH OUT, NO CHEWING GUM CANDY OR MINTS.     Take these medicines the morning of surgery with A SIP OF WATER: escitalopram,omeprazole,gabapentin,prednisone.Use inhalers,flonase and eye drops as usual.                                You may not have any metal on your body including hair pins and              piercings  Do not wear jewelry, make-up, lotions, powders or perfumes, deodorant             Do not wear nail polish on your fingernails.  Do not shave  48 hours prior to surgery.           Do not bring valuables to the hospital. [place].  Contacts, dentures or bridgework may not be worn into surgery.  Leave suitcase in the car. After surgery it may be brought to your room.     Patients discharged the day of surgery will not be allowed to drive home. IF YOU ARE HAVING SURGERY AND GOING HOME THE SAME DAY, YOU MUST HAVE AN ADULT TO DRIVE YOU HOME AND BE WITH YOU FOR 24 HOURS. YOU MAY GO HOME BY TAXI OR UBER OR ORTHERWISE, BUT AN ADULT MUST ACCOMPANY YOU HOME AND STAY WITH YOU FOR 24 HOURS.  Name and phone number of your driver:  Special Instructions: N/A              Please read over the following fact sheets you were given: _____________________________________________________________________          [facility] - Preparing for Surgery Before surgery, you can play an important role.  Because skin is not sterile, your skin needs to be as free of germs as possible.  You can reduce the number of germs on your skin by washing with CHG (chlorahexidine gluconate) soap before surgery.  CHG is an antiseptic cleaner which kills germs and bonds with the skin to continue killing germs even after washing. Please DO NOT use if you have an allergy to CHG or antibacterial soaps.  If your skin becomes reddened/irritated stop using the CHG and inform your nurse when you arrive at Short Stay. Do not shave  (including legs and underarms) for at least 48 hours prior to the first CHG shower.  You may shave your face/neck. Please follow these instructions carefully:  1.  Shower with CHG Soap the night before surgery and the  morning of Surgery.  2.  If you choose to wash your hair, wash your hair first as usual with your  normal  shampoo.  3.  After you shampoo, rinse your hair and body thoroughly to remove the  shampoo.                           4.  Use CHG as you would any other liquid soap.  You can apply chg directly  to the skin and wash                       Gently  with a scrungie or clean washcloth.  5.  Apply the CHG Soap to your body ONLY FROM THE NECK DOWN.   Do not use on face/ open                           Wound or open sores. Avoid contact with eyes, ears mouth and genitals (private parts).                       Wash face,  Genitals (private parts) with your normal soap.             6.  Wash thoroughly, paying special attention to the area where your surgery  will be performed.  7.  Thoroughly rinse your body with warm water from the neck down.  8.  DO NOT shower/wash with your normal soap after using and rinsing off  the CHG Soap.                9.  Pat yourself dry with a clean towel.            10.  Wear clean pajamas.            11.  Place clean sheets on your bed the night of your first shower and do not  sleep with pets. Day of Surgery : Do not apply any lotions/deodorants the morning of surgery.  Please wear clean clothes to the hospital/surgery center.  FAILURE TO FOLLOW THESE INSTRUCTIONS MAY RESULT IN THE CANCELLATION OF YOUR SURGERY PATIENT SIGNATURE_________________________________  NURSE SIGNATURE__________________________________  ________________________________________________________________________   Adam Phenix  An incentive spirometer is a tool that can help keep your lungs clear and active. This tool measures how well you are filling your lungs with  each breath. Taking long deep breaths may help reverse or decrease the chance of developing breathing (pulmonary) problems (especially infection) following:  A long period of time when you are unable to move or be active. BEFORE THE PROCEDURE   If the spirometer includes an indicator to show your best effort, your nurse or respiratory therapist will set it to a desired goal.  If possible, sit up straight or lean slightly forward. Try not to slouch.  Hold the incentive spirometer in an upright position. INSTRUCTIONS FOR USE  1. Sit on the edge of your bed if possible, or sit up as far as you can in bed or on a chair. 2. Hold the incentive spirometer in an upright position. 3. Breathe out normally. 4. Place the mouthpiece in your mouth and seal your lips tightly around it. 5. Breathe in slowly and as deeply as possible, raising the piston or the ball toward the top of the column. 6. Hold your breath for 3-5 seconds or for as long as possible. Allow the piston or ball to fall to the bottom of the column. 7. Remove the mouthpiece from your mouth and breathe out normally. 8. Rest for a few seconds and repeat Steps 1 through 7 at least 10 times every 1-2 hours when you are awake. Take your time and take a few normal breaths between deep breaths. 9. The spirometer may include an indicator to show your best effort. Use the indicator as a goal to work toward during each repetition. 10. After each set of 10 deep breaths, practice coughing to be sure your lungs are clear. If you have an incision (the cut made at the time of surgery), support  your incision when coughing by placing a pillow or rolled up towels firmly against it. Once you are able to get out of bed, walk around indoors and cough well. You may stop using the incentive spirometer when instructed by your caregiver.  RISKS AND COMPLICATIONS  Take your time so you do not get dizzy or light-headed.  If you are in pain, you may need to take or  ask for pain medication before doing incentive spirometry. It is harder to take a deep breath if you are having pain. AFTER USE  Rest and breathe slowly and easily.  It can be helpful to keep track of a log of your progress. Your caregiver can provide you with a simple table to help with this. If you are using the spirometer at home, follow these instructions: [place] IF:   You are having difficultly using the spirometer.  You have trouble using the spirometer as often as instructed.  Your pain medication is not giving enough relief while using the spirometer.  You develop fever of 100.5 F (38.1 C) or higher. SEEK IMMEDIATE MEDICAL CARE IF:   You cough up bloody sputum that had not been present before.  You develop fever of 102 F (38.9 C) or greater.  You develop worsening pain at or near the incision site. MAKE SURE YOU:   Understand these instructions.  Will watch your condition.  Will get help right away if you are not doing well or get worse. Document Released: 08/15/2006 Document Revised: 06/27/2011 Document Reviewed: 10/16/2006 [facility] Patient Information 2014 [place], [place].   ________________________________________________________________________

## 2019-11-21 ENCOUNTER — Other Ambulatory Visit: Payer: Self-pay

## 2019-11-21 ENCOUNTER — Encounter (HOSPITAL_COMMUNITY): Payer: Self-pay

## 2019-11-21 ENCOUNTER — Encounter (HOSPITAL_COMMUNITY)
Admission: RE | Admit: 2019-11-21 | Discharge: 2019-11-21 | Disposition: A | Discharge status: Home / Self Care | Payer: Medicare Other | Source: Ambulatory Visit | Attending: Orthopedic Surgery | Admitting: Orthopedic Surgery

## 2019-11-21 DIAGNOSIS — Z01812 Encounter for preprocedural laboratory examination: Secondary | ICD-10-CM | POA: Insufficient documentation

## 2019-11-21 HISTORY — DX: Family history of other specified conditions: Z84.89

## 2019-11-21 LAB — BASIC METABOLIC PANEL
Anion gap: 9 (ref 5–15)
BUN: 20 mg/dL (ref 8–23)
CO2: 28 mmol/L (ref 22–32)
Calcium: 8.8 mg/dL — ABNORMAL LOW (ref 8.9–10.3)
Chloride: 96 mmol/L — ABNORMAL LOW (ref 98–111)
Creatinine, Ser: 0.93 mg/dL (ref 0.44–1.00)
GFR calc Af Amer: >60 mL/min (ref >60)
GFR calc non Af Amer: >60 mL/min (ref >60)
Glucose, Bld: 110 mg/dL — ABNORMAL HIGH (ref 70–99)
Potassium: 4.3 mmol/L (ref 3.5–5.1)
Sodium: 133 mmol/L — ABNORMAL LOW (ref 135–145)

## 2019-11-21 LAB — CBC
HCT: 42.1 % (ref 36.0–46.0)
Hemoglobin: 13.5 g/dL (ref 12.0–15.0)
MCH: 32.1 pg (ref 26.0–34.0)
MCHC: 32.1 g/dL (ref 30.0–36.0)
MCV: 100.2 fL — ABNORMAL HIGH (ref 80.0–100.0)
Platelets: 280 10*3/uL (ref 150–400)
RBC: 4.2 MIL/uL (ref 3.87–5.11)
RDW: 13.8 % (ref 11.5–15.5)
WBC: 11.2 10*3/uL — ABNORMAL HIGH (ref 4.0–10.5)
nRBC: 0 % (ref 0.0–0.2)

## 2019-11-21 LAB — SURGICAL PCR SCREEN
MRSA, PCR: POSITIVE — AB
Staphylococcus aureus: POSITIVE — AB

## 2019-11-21 NOTE — Progress Notes (Addendum)
PCP - Dr. Juanell Fairly lov 11-18-19 epic Cardiologist - Dr. Susa Griffins 12-18-18  PPM/ICD -  Device Orders -  Rep Notified -   Chest x-ray - 01-08-19 epic EKG - 08-12-19 epic Stress Test -  ECHO -  Cardiac Cath -   Sleep Study -  CPAP -   Fasting Blood Sugar -  Checks Blood Sugar _____ times a day  Blood Thinner Instructions: Aspirin Instructions: 81mg   ERAS Protcol - PRE-SURGERY Ensure   COVID TEST-    Anesthesia review: HTN , PSVT  Patient denies shortness of breath, fever, cough and chest pain at PAT appointment   NONE  ACTIVITY-Able to walk up a flight of stairs without SOB  All instructions explained to the patient, with a verbal understanding of the material. Patient agrees to go over the instructions while at home for a better understanding. Patient also instructed to self quarantine after being tested for COVID-19. The opportunity to ask questions was provided.

## 2019-11-22 ENCOUNTER — Other Ambulatory Visit (HOSPITAL_COMMUNITY)
Admission: RE | Admit: 2019-11-22 | Discharge: 2019-11-22 | Disposition: A | Discharge status: Home / Self Care | Payer: Medicare Other | Source: Ambulatory Visit | Attending: Orthopedic Surgery | Admitting: Orthopedic Surgery

## 2019-11-22 DIAGNOSIS — Z20822 Contact with and (suspected) exposure to covid-19: Secondary | ICD-10-CM | POA: Diagnosis not present

## 2019-11-22 DIAGNOSIS — Z01812 Encounter for preprocedural laboratory examination: Secondary | ICD-10-CM | POA: Diagnosis present

## 2019-11-22 LAB — SARS CORONAVIRUS 2 (TAT 6-24 HRS): SARS Coronavirus 2: NEGATIVE

## 2019-11-26 ENCOUNTER — Other Ambulatory Visit: Payer: Self-pay

## 2019-11-26 ENCOUNTER — Encounter (HOSPITAL_COMMUNITY)
Admission: RE | Disposition: A | Discharge status: Home / Self Care | Payer: Self-pay | Source: Home / Self Care | Attending: Orthopedic Surgery

## 2019-11-26 ENCOUNTER — Ambulatory Visit (HOSPITAL_COMMUNITY): Payer: Medicare Other | Admitting: Physician Assistant

## 2019-11-26 ENCOUNTER — Encounter (HOSPITAL_COMMUNITY): Payer: Self-pay | Admitting: Orthopedic Surgery

## 2019-11-26 ENCOUNTER — Observation Stay (HOSPITAL_COMMUNITY)
Admission: RE | Admit: 2019-11-26 | Discharge: 2019-11-27 | Disposition: A | Discharge status: Home / Self Care | Payer: Medicare Other | Attending: Orthopedic Surgery | Admitting: Orthopedic Surgery

## 2019-11-26 ENCOUNTER — Ambulatory Visit (HOSPITAL_COMMUNITY): Payer: Medicare Other | Admitting: Certified Registered Nurse Anesthetist

## 2019-11-26 DIAGNOSIS — J45901 Unspecified asthma with (acute) exacerbation: Secondary | ICD-10-CM | POA: Diagnosis not present

## 2019-11-26 DIAGNOSIS — Z87891 Personal history of nicotine dependence: Secondary | ICD-10-CM | POA: Diagnosis not present

## 2019-11-26 DIAGNOSIS — M1712 Unilateral primary osteoarthritis, left knee: Principal | ICD-10-CM | POA: Diagnosis present

## 2019-11-26 DIAGNOSIS — Z7982 Long term (current) use of aspirin: Secondary | ICD-10-CM | POA: Insufficient documentation

## 2019-11-26 DIAGNOSIS — E663 Overweight: Secondary | ICD-10-CM | POA: Diagnosis present

## 2019-11-26 DIAGNOSIS — Z96641 Presence of right artificial hip joint: Secondary | ICD-10-CM | POA: Insufficient documentation

## 2019-11-26 DIAGNOSIS — I10 Essential (primary) hypertension: Secondary | ICD-10-CM | POA: Diagnosis not present

## 2019-11-26 DIAGNOSIS — Z96652 Presence of left artificial knee joint: Secondary | ICD-10-CM

## 2019-11-26 DIAGNOSIS — Z6828 Body mass index (BMI) 28.0-28.9, adult: Secondary | ICD-10-CM | POA: Diagnosis present

## 2019-11-26 HISTORY — PX: TOTAL KNEE ARTHROPLASTY: SHX125

## 2019-11-26 LAB — TYPE AND SCREEN
ABO/RH(D): O POS
Antibody Screen: NEGATIVE

## 2019-11-26 SURGERY — ARTHROPLASTY, KNEE, TOTAL
Anesthesia: General | Site: Knee | Laterality: Left

## 2019-11-26 MED ORDER — KETOROLAC TROMETHAMINE 30 MG/ML IJ SOLN
INTRAMUSCULAR | Status: AC
  Filled 2019-11-26: qty 1

## 2019-11-26 MED ORDER — METHOCARBAMOL 500 MG PO TABS
500.0000 mg | ORAL_TABLET | Freq: Four times a day (QID) | ORAL | Status: DC | PRN
  Administered 2019-11-26: 500 mg via ORAL
  Filled 2019-11-26: qty 1

## 2019-11-26 MED ORDER — HYDROCODONE-ACETAMINOPHEN 7.5-325 MG PO TABS: 1.0000 | ORAL_TABLET | ORAL | Status: DC | PRN

## 2019-11-26 MED ORDER — FAMOTIDINE 20 MG PO TABS
40.0000 mg | ORAL_TABLET | Freq: Every day | ORAL | Status: DC
  Administered 2019-11-26: 40 mg via ORAL
  Filled 2019-11-26: qty 2

## 2019-11-26 MED ORDER — KETOROLAC TROMETHAMINE 30 MG/ML IJ SOLN
INTRAMUSCULAR | Status: DC | PRN
  Administered 2019-11-26: 30 mg

## 2019-11-26 MED ORDER — ORAL CARE MOUTH RINSE: 15.0000 mL | Freq: Once | OROMUCOSAL | Status: AC

## 2019-11-26 MED ORDER — SODIUM CHLORIDE (PF) 0.9 % IJ SOLN
INTRAMUSCULAR | Status: DC | PRN
  Administered 2019-11-26: 30 mL

## 2019-11-26 MED ORDER — ESCITALOPRAM OXALATE 20 MG PO TABS
20.0000 mg | ORAL_TABLET | Freq: Every day | ORAL | Status: DC
  Administered 2019-11-27: 20 mg via ORAL
  Filled 2019-11-26: qty 1

## 2019-11-26 MED ORDER — DIPHENHYDRAMINE HCL 12.5 MG/5ML PO ELIX: 12.5000 mg | ORAL_SOLUTION | ORAL | Status: DC | PRN

## 2019-11-26 MED ORDER — BUPIVACAINE-EPINEPHRINE (PF) 0.25% -1:200000 IJ SOLN
INTRAMUSCULAR | Status: AC
  Filled 2019-11-26: qty 30

## 2019-11-26 MED ORDER — LIFITEGRAST 5 % OP SOLN: 1.0000 [drp] | Freq: Two times a day (BID) | OPHTHALMIC | Status: DC

## 2019-11-26 MED ORDER — IRBESARTAN 150 MG PO TABS
300.0000 mg | ORAL_TABLET | Freq: Every day | ORAL | Status: DC
  Administered 2019-11-26 – 2019-11-27 (×2): 300 mg via ORAL
  Filled 2019-11-26 (×2): qty 2

## 2019-11-26 MED ORDER — MAGNESIUM CITRATE PO SOLN: 1.0000 | Freq: Once | ORAL | Status: DC | PRN

## 2019-11-26 MED ORDER — EPHEDRINE 5 MG/ML INJ
INTRAVENOUS | Status: AC
  Filled 2019-11-26: qty 10

## 2019-11-26 MED ORDER — SODIUM CHLORIDE 0.9 % IR SOLN
Status: DC | PRN
  Administered 2019-11-26: 1000 mL

## 2019-11-26 MED ORDER — CARBOXYMETHYLCELLULOSE SODIUM 0.5 % OP SOLN: 1.0000 [drp] | Freq: Three times a day (TID) | OPHTHALMIC | Status: DC | PRN

## 2019-11-26 MED ORDER — ASPIRIN 81 MG PO CHEW
81.0000 mg | CHEWABLE_TABLET | Freq: Two times a day (BID) | ORAL | Status: DC
  Administered 2019-11-26 – 2019-11-27 (×2): 81 mg via ORAL
  Filled 2019-11-26 (×2): qty 1

## 2019-11-26 MED ORDER — PREDNISONE 5 MG PO TABS
5.0000 mg | ORAL_TABLET | Freq: Every day | ORAL | Status: DC
  Administered 2019-11-27: 5 mg via ORAL
  Filled 2019-11-26: qty 1

## 2019-11-26 MED ORDER — CHLORTHALIDONE 25 MG PO TABS
12.5000 mg | ORAL_TABLET | Freq: Every day | ORAL | Status: DC
  Administered 2019-11-27: 12.5 mg via ORAL
  Filled 2019-11-26: qty 1

## 2019-11-26 MED ORDER — HYDROMORPHONE HCL 1 MG/ML IJ SOLN
INTRAMUSCULAR | Status: AC
  Administered 2019-11-26: 0.5 mg via INTRAVENOUS
  Filled 2019-11-26: qty 1

## 2019-11-26 MED ORDER — BISACODYL 10 MG RE SUPP: 10.0000 mg | Freq: Every day | RECTAL | Status: DC | PRN

## 2019-11-26 MED ORDER — VANCOMYCIN HCL IN DEXTROSE 1-5 GM/200ML-% IV SOLN
1000.0000 mg | Freq: Once | INTRAVENOUS | Status: AC
  Administered 2019-11-26: 1000 mg via INTRAVENOUS
  Filled 2019-11-26: qty 200

## 2019-11-26 MED ORDER — POLYVINYL ALCOHOL 1.4 % OP SOLN: 1.0000 [drp] | OPHTHALMIC | Status: DC | PRN

## 2019-11-26 MED ORDER — OLOPATADINE HCL 0.6 % NA SOLN: 2.0000 | Freq: Two times a day (BID) | NASAL | Status: DC

## 2019-11-26 MED ORDER — METHOCARBAMOL 500 MG IVPB - SIMPLE MED
500.0000 mg | Freq: Four times a day (QID) | INTRAVENOUS | Status: DC | PRN
  Administered 2019-11-26: 500 mg via INTRAVENOUS
  Filled 2019-11-26: qty 50

## 2019-11-26 MED ORDER — LIDOCAINE 2% (20 MG/ML) 5 ML SYRINGE
INTRAMUSCULAR | Status: DC | PRN
  Administered 2019-11-26: 100 mg via INTRAVENOUS

## 2019-11-26 MED ORDER — ALUM & MAG HYDROXIDE-SIMETH 200-200-20 MG/5ML PO SUSP: 15.0000 mL | ORAL | Status: DC | PRN

## 2019-11-26 MED ORDER — FENTANYL CITRATE (PF) 100 MCG/2ML IJ SOLN
INTRAMUSCULAR | Status: AC
  Filled 2019-11-26: qty 2

## 2019-11-26 MED ORDER — TRANEXAMIC ACID-NACL 1000-0.7 MG/100ML-% IV SOLN
1000.0000 mg | Freq: Once | INTRAVENOUS | Status: AC
  Administered 2019-11-26: 1000 mg via INTRAVENOUS

## 2019-11-26 MED ORDER — TRANEXAMIC ACID-NACL 1000-0.7 MG/100ML-% IV SOLN
1000.0000 mg | INTRAVENOUS | Status: AC
  Administered 2019-11-26: 1000 mg via INTRAVENOUS
  Filled 2019-11-26: qty 100

## 2019-11-26 MED ORDER — ONDANSETRON HCL 4 MG/2ML IJ SOLN
INTRAMUSCULAR | Status: AC
  Filled 2019-11-26: qty 2

## 2019-11-26 MED ORDER — NON FORMULARY: 40.0000 mg | Freq: Every day | Status: DC

## 2019-11-26 MED ORDER — MIDAZOLAM HCL 2 MG/2ML IJ SOLN
1.0000 mg | Freq: Once | INTRAMUSCULAR | Status: AC
  Administered 2019-11-26: 2 mg via INTRAVENOUS
  Filled 2019-11-26: qty 2

## 2019-11-26 MED ORDER — GABAPENTIN 400 MG PO CAPS
400.0000 mg | ORAL_CAPSULE | Freq: Three times a day (TID) | ORAL | Status: DC
  Administered 2019-11-26 – 2019-11-27 (×3): 400 mg via ORAL
  Filled 2019-11-26 (×4): qty 1

## 2019-11-26 MED ORDER — FENTANYL CITRATE (PF) 100 MCG/2ML IJ SOLN
INTRAMUSCULAR | Status: DC | PRN
  Administered 2019-11-26 (×4): 25 ug via INTRAVENOUS
  Administered 2019-11-26 (×2): 50 ug via INTRAVENOUS

## 2019-11-26 MED ORDER — DEXAMETHASONE SODIUM PHOSPHATE 10 MG/ML IJ SOLN: 10.0000 mg | Freq: Once | INTRAMUSCULAR | Status: DC

## 2019-11-26 MED ORDER — HYDROMORPHONE HCL 1 MG/ML IJ SOLN
0.2500 mg | INTRAMUSCULAR | Status: DC | PRN
  Administered 2019-11-26 (×2): 0.5 mg via INTRAVENOUS

## 2019-11-26 MED ORDER — SODIUM CHLORIDE 0.9 % IV SOLN: INTRAVENOUS | Status: DC

## 2019-11-26 MED ORDER — CELECOXIB 200 MG PO CAPS
200.0000 mg | ORAL_CAPSULE | Freq: Two times a day (BID) | ORAL | Status: DC
  Administered 2019-11-26 – 2019-11-27 (×2): 200 mg via ORAL
  Filled 2019-11-26 (×2): qty 1

## 2019-11-26 MED ORDER — ONDANSETRON HCL 4 MG/2ML IJ SOLN
INTRAMUSCULAR | Status: DC | PRN
  Administered 2019-11-26: 4 mg via INTRAVENOUS

## 2019-11-26 MED ORDER — FERROUS SULFATE 325 (65 FE) MG PO TABS
325.0000 mg | ORAL_TABLET | Freq: Two times a day (BID) | ORAL | Status: DC
  Administered 2019-11-27: 325 mg via ORAL
  Filled 2019-11-26: qty 1

## 2019-11-26 MED ORDER — FLUTICASONE PROPIONATE 50 MCG/ACT NA SUSP
1.0000 | Freq: Two times a day (BID) | NASAL | Status: DC
  Administered 2019-11-26: 1 via NASAL
  Filled 2019-11-26: qty 16

## 2019-11-26 MED ORDER — ACETAMINOPHEN 325 MG PO TABS: 325.0000 mg | ORAL_TABLET | Freq: Four times a day (QID) | ORAL | Status: DC | PRN

## 2019-11-26 MED ORDER — DOCUSATE SODIUM 100 MG PO CAPS
100.0000 mg | ORAL_CAPSULE | Freq: Two times a day (BID) | ORAL | Status: DC
  Administered 2019-11-26 – 2019-11-27 (×2): 100 mg via ORAL
  Filled 2019-11-26 (×2): qty 1

## 2019-11-26 MED ORDER — CHLORHEXIDINE GLUCONATE 0.12 % MT SOLN
15.0000 mL | Freq: Once | OROMUCOSAL | Status: AC
  Administered 2019-11-26: 15 mL via OROMUCOSAL

## 2019-11-26 MED ORDER — POLYETHYLENE GLYCOL 3350 17 G PO PACK
17.0000 g | PACK | Freq: Two times a day (BID) | ORAL | Status: DC
  Administered 2019-11-26 – 2019-11-27 (×2): 17 g via ORAL
  Filled 2019-11-26 (×2): qty 1

## 2019-11-26 MED ORDER — PSEUDOEPHEDRINE HCL 30 MG PO TABS
30.0000 mg | ORAL_TABLET | Freq: Every day | ORAL | Status: DC | PRN
  Filled 2019-11-26: qty 1

## 2019-11-26 MED ORDER — METOCLOPRAMIDE HCL 5 MG/ML IJ SOLN: 5.0000 mg | Freq: Three times a day (TID) | INTRAMUSCULAR | Status: DC | PRN

## 2019-11-26 MED ORDER — MONTELUKAST SODIUM 10 MG PO TABS
10.0000 mg | ORAL_TABLET | Freq: Every day | ORAL | Status: DC
  Administered 2019-11-26: 10 mg via ORAL
  Filled 2019-11-26: qty 1

## 2019-11-26 MED ORDER — PROPOFOL 10 MG/ML IV BOLUS
INTRAVENOUS | Status: DC | PRN
  Administered 2019-11-26: 150 mg via INTRAVENOUS

## 2019-11-26 MED ORDER — AZELASTINE HCL 0.1 % NA SOLN
2.0000 | Freq: Two times a day (BID) | NASAL | Status: DC
  Administered 2019-11-26: 2 via NASAL
  Filled 2019-11-26: qty 30

## 2019-11-26 MED ORDER — LIDOCAINE 2% (20 MG/ML) 5 ML SYRINGE
INTRAMUSCULAR | Status: AC
  Filled 2019-11-26: qty 5

## 2019-11-26 MED ORDER — ROPIVACAINE HCL 7.5 MG/ML IJ SOLN
INTRAMUSCULAR | Status: DC | PRN
  Administered 2019-11-26: 20 mL via PERINEURAL

## 2019-11-26 MED ORDER — PHENOL 1.4 % MT LIQD: 1.0000 | OROMUCOSAL | Status: DC | PRN

## 2019-11-26 MED ORDER — HYDROCODONE-ACETAMINOPHEN 5-325 MG PO TABS
1.0000 | ORAL_TABLET | ORAL | Status: DC | PRN
  Administered 2019-11-26 – 2019-11-27 (×3): 2 via ORAL
  Filled 2019-11-26 (×3): qty 2

## 2019-11-26 MED ORDER — STERILE WATER FOR IRRIGATION IR SOLN
Status: DC | PRN
  Administered 2019-11-26 (×2): 1000 mL

## 2019-11-26 MED ORDER — ATORVASTATIN CALCIUM 40 MG PO TABS
80.0000 mg | ORAL_TABLET | Freq: Every day | ORAL | Status: DC
  Administered 2019-11-26: 80 mg via ORAL
  Filled 2019-11-26: qty 2

## 2019-11-26 MED ORDER — SODIUM CHLORIDE (PF) 0.9 % IJ SOLN
INTRAMUSCULAR | Status: AC
  Filled 2019-11-26: qty 50

## 2019-11-26 MED ORDER — ONDANSETRON HCL 4 MG/2ML IJ SOLN: 4.0000 mg | Freq: Four times a day (QID) | INTRAMUSCULAR | Status: DC | PRN

## 2019-11-26 MED ORDER — CEFAZOLIN SODIUM-DEXTROSE 2-4 GM/100ML-% IV SOLN
2.0000 g | INTRAVENOUS | Status: AC
  Administered 2019-11-26: 2 g via INTRAVENOUS
  Filled 2019-11-26: qty 100

## 2019-11-26 MED ORDER — CEFAZOLIN SODIUM-DEXTROSE 2-4 GM/100ML-% IV SOLN
2.0000 g | Freq: Four times a day (QID) | INTRAVENOUS | Status: AC
  Administered 2019-11-26 (×2): 2 g via INTRAVENOUS
  Filled 2019-11-26 (×2): qty 100

## 2019-11-26 MED ORDER — HYDROCODONE-ACETAMINOPHEN 5-325 MG PO TABS
ORAL_TABLET | ORAL | Status: AC
  Administered 2019-11-26: 2 via ORAL
  Filled 2019-11-26: qty 2

## 2019-11-26 MED ORDER — ONDANSETRON HCL 4 MG/2ML IJ SOLN: 4.0000 mg | Freq: Once | INTRAMUSCULAR | Status: DC | PRN

## 2019-11-26 MED ORDER — PROPOFOL 10 MG/ML IV BOLUS
INTRAVENOUS | Status: AC
  Filled 2019-11-26: qty 20

## 2019-11-26 MED ORDER — METHOCARBAMOL 500 MG IVPB - SIMPLE MED
INTRAVENOUS | Status: AC
  Filled 2019-11-26: qty 50

## 2019-11-26 MED ORDER — DM-GUAIFENESIN ER 30-600 MG PO TB12: 1.0000 | ORAL_TABLET | Freq: Every day | ORAL | Status: DC | PRN

## 2019-11-26 MED ORDER — ALBUTEROL SULFATE (2.5 MG/3ML) 0.083% IN NEBU: 2.5000 mg | INHALATION_SOLUTION | RESPIRATORY_TRACT | Status: DC | PRN

## 2019-11-26 MED ORDER — TRANEXAMIC ACID-NACL 1000-0.7 MG/100ML-% IV SOLN
INTRAVENOUS | Status: AC
  Filled 2019-11-26: qty 100

## 2019-11-26 MED ORDER — METOCLOPRAMIDE HCL 5 MG PO TABS: 5.0000 mg | ORAL_TABLET | Freq: Three times a day (TID) | ORAL | Status: DC | PRN

## 2019-11-26 MED ORDER — FENTANYL CITRATE (PF) 100 MCG/2ML IJ SOLN
50.0000 ug | Freq: Once | INTRAMUSCULAR | Status: AC
  Administered 2019-11-26: 100 ug via INTRAVENOUS
  Filled 2019-11-26: qty 2

## 2019-11-26 MED ORDER — VALACYCLOVIR HCL 500 MG PO TABS
1000.0000 mg | ORAL_TABLET | Freq: Every day | ORAL | Status: DC
  Administered 2019-11-26: 1000 mg via ORAL
  Filled 2019-11-26 (×2): qty 2

## 2019-11-26 MED ORDER — ONDANSETRON HCL 4 MG PO TABS: 4.0000 mg | ORAL_TABLET | Freq: Four times a day (QID) | ORAL | Status: DC | PRN

## 2019-11-26 MED ORDER — OMEPRAZOLE 20 MG PO CPDR
40.0000 mg | DELAYED_RELEASE_CAPSULE | Freq: Every day | ORAL | Status: DC
  Administered 2019-11-27: 40 mg via ORAL
  Filled 2019-11-26: qty 2

## 2019-11-26 MED ORDER — HYDROMORPHONE HCL 1 MG/ML IJ SOLN
0.5000 mg | INTRAMUSCULAR | Status: DC | PRN
  Administered 2019-11-27: 1 mg via INTRAVENOUS
  Filled 2019-11-26: qty 1

## 2019-11-26 MED ORDER — MEPERIDINE HCL 50 MG/ML IJ SOLN: 6.2500 mg | INTRAMUSCULAR | Status: DC | PRN

## 2019-11-26 MED ORDER — TRAZODONE HCL 100 MG PO TABS
100.0000 mg | ORAL_TABLET | Freq: Every day | ORAL | Status: DC
  Administered 2019-11-26: 100 mg via ORAL
  Filled 2019-11-26: qty 1

## 2019-11-26 MED ORDER — DEXAMETHASONE SODIUM PHOSPHATE 10 MG/ML IJ SOLN
INTRAMUSCULAR | Status: AC
  Filled 2019-11-26: qty 1

## 2019-11-26 MED ORDER — BUPIVACAINE-EPINEPHRINE (PF) 0.25% -1:200000 IJ SOLN
INTRAMUSCULAR | Status: DC | PRN
  Administered 2019-11-26: 30 mL

## 2019-11-26 MED ORDER — EPHEDRINE SULFATE-NACL 50-0.9 MG/10ML-% IV SOSY
PREFILLED_SYRINGE | INTRAVENOUS | Status: DC | PRN
  Administered 2019-11-26 (×6): 10 mg via INTRAVENOUS

## 2019-11-26 MED ORDER — FUROSEMIDE 20 MG PO TABS
10.0000 mg | ORAL_TABLET | Freq: Every day | ORAL | Status: DC
  Filled 2019-11-26: qty 1

## 2019-11-26 MED ORDER — BUDESONIDE 0.25 MG/2ML IN SUSP
0.2500 mg | Freq: Two times a day (BID) | RESPIRATORY_TRACT | Status: DC
  Administered 2019-11-26: 0.25 mg via RESPIRATORY_TRACT
  Filled 2019-11-26 (×2): qty 2

## 2019-11-26 MED ORDER — POVIDONE-IODINE 10 % EX SWAB: 2.0000 "application " | Freq: Once | CUTANEOUS | Status: DC

## 2019-11-26 MED ORDER — DEXAMETHASONE SODIUM PHOSPHATE 10 MG/ML IJ SOLN
10.0000 mg | Freq: Once | INTRAMUSCULAR | Status: AC
  Administered 2019-11-26: 10 mg via INTRAVENOUS

## 2019-11-26 MED ORDER — LACTATED RINGERS IV SOLN: INTRAVENOUS | Status: DC

## 2019-11-26 MED ORDER — MENTHOL 3 MG MT LOZG: 1.0000 | LOZENGE | OROMUCOSAL | Status: DC | PRN

## 2019-11-26 SURGICAL SUPPLY — 64 items
ADH SKN CLS APL DERMABOND .7 (GAUZE/BANDAGES/DRESSINGS) ×1
ATTUNE MED ANAT PAT 35 KNEE (Knees) ×1 IMPLANT
ATTUNE PSFEM LTSZ6 NARCEM KNEE (Femur) ×1 IMPLANT
ATTUNE PSRP INSR SZ6 8 KNEE (Insert) ×1 IMPLANT
BAG SPEC THK2 15X12 ZIP CLS (MISCELLANEOUS)
BAG ZIPLOCK 12X15 (MISCELLANEOUS) IMPLANT
BASE TIBIAL ROT PLAT SZ 5 KNEE (Knees) IMPLANT
BLADE SAW SGTL 11.0X1.19X90.0M (BLADE) IMPLANT
BLADE SAW SGTL 13.0X1.19X90.0M (BLADE) ×2 IMPLANT
BLADE SURG SZ10 CARB STEEL (BLADE) ×4 IMPLANT
BNDG ELASTIC 6X5.8 VLCR STR LF (GAUZE/BANDAGES/DRESSINGS) ×2 IMPLANT
BOWL SMART MIX CTS (DISPOSABLE) ×2 IMPLANT
BSPLAT TIB 5 CMNT ROT PLAT STR (Knees) ×1 IMPLANT
CEMENT HV SMART SET (Cement) ×2 IMPLANT
COVER SURGICAL LIGHT HANDLE (MISCELLANEOUS) ×2 IMPLANT
COVER WAND RF STERILE (DRAPES) IMPLANT
CUFF TOURN SGL QUICK 34 (TOURNIQUET CUFF) ×2
CUFF TRNQT CYL 34X4.125X (TOURNIQUET CUFF) ×1 IMPLANT
DECANTER SPIKE VIAL GLASS SM (MISCELLANEOUS) ×4 IMPLANT
DERMABOND ADVANCED (GAUZE/BANDAGES/DRESSINGS) ×1
DERMABOND ADVANCED .7 DNX12 (GAUZE/BANDAGES/DRESSINGS) ×1 IMPLANT
DRAPE U-SHAPE 47X51 STRL (DRAPES) ×2 IMPLANT
DRESSING AQUACEL AG SP 3.5X10 (GAUZE/BANDAGES/DRESSINGS) ×1 IMPLANT
DRSG AQUACEL AG ADV 3.5X10 (GAUZE/BANDAGES/DRESSINGS) ×1 IMPLANT
DRSG AQUACEL AG SP 3.5X10 (GAUZE/BANDAGES/DRESSINGS) ×2
DURAPREP 26ML APPLICATOR (WOUND CARE) ×4 IMPLANT
ELECT REM PT RETURN 15FT ADLT (MISCELLANEOUS) ×2 IMPLANT
GLOVE BIO SURGEON STRL SZ 6 (GLOVE) ×2 IMPLANT
GLOVE BIOGEL PI IND STRL 6.5 (GLOVE) ×1 IMPLANT
GLOVE BIOGEL PI IND STRL 7.5 (GLOVE) ×1 IMPLANT
GLOVE BIOGEL PI IND STRL 8.5 (GLOVE) ×1 IMPLANT
GLOVE BIOGEL PI INDICATOR 6.5 (GLOVE) ×1
GLOVE BIOGEL PI INDICATOR 7.5 (GLOVE) ×1
GLOVE BIOGEL PI INDICATOR 8.5 (GLOVE) ×1
GLOVE ECLIPSE 8.0 STRL XLNG CF (GLOVE) ×2 IMPLANT
GLOVE ORTHO TXT STRL SZ7.5 (GLOVE) ×2 IMPLANT
GOWN STRL REUS W/ TWL LRG LVL3 (GOWN DISPOSABLE) ×1 IMPLANT
GOWN STRL REUS W/TWL 2XL LVL3 (GOWN DISPOSABLE) ×2 IMPLANT
GOWN STRL REUS W/TWL LRG LVL3 (GOWN DISPOSABLE) ×4 IMPLANT
HANDPIECE INTERPULSE COAX TIP (DISPOSABLE) ×2
HOLDER FOLEY CATH W/STRAP (MISCELLANEOUS) IMPLANT
KIT TURNOVER KIT A (KITS) IMPLANT
MANIFOLD NEPTUNE II (INSTRUMENTS) ×2 IMPLANT
NDL SAFETY ECLIPSE 18X1.5 (NEEDLE) IMPLANT
NEEDLE HYPO 18GX1.5 SHARP (NEEDLE)
NS IRRIG 1000ML POUR BTL (IV SOLUTION) ×2 IMPLANT
PACK TOTAL KNEE CUSTOM (KITS) ×2 IMPLANT
PENCIL SMOKE EVACUATOR (MISCELLANEOUS) IMPLANT
PIN FIX SIGMA LCS THRD HI (PIN) ×1 IMPLANT
PIN STEINMAN FIXATION KNEE (PIN) ×1 IMPLANT
PROTECTOR NERVE ULNAR (MISCELLANEOUS) ×2 IMPLANT
SET HNDPC FAN SPRY TIP SCT (DISPOSABLE) ×1 IMPLANT
SET PAD KNEE POSITIONER (MISCELLANEOUS) ×2 IMPLANT
SUT MNCRL AB 4-0 PS2 18 (SUTURE) ×2 IMPLANT
SUT STRATAFIX PDS+ 0 24IN (SUTURE) ×2 IMPLANT
SUT VIC AB 1 CT1 36 (SUTURE) ×2 IMPLANT
SUT VIC AB 2-0 CT1 27 (SUTURE) ×6
SUT VIC AB 2-0 CT1 TAPERPNT 27 (SUTURE) ×3 IMPLANT
SYR 3ML LL SCALE MARK (SYRINGE) ×2 IMPLANT
TIBIAL BASE ROT PLAT SZ 5 KNEE (Knees) ×2 IMPLANT
TRAY FOLEY MTR SLVR 16FR STAT (SET/KITS/TRAYS/PACK) ×2 IMPLANT
WATER STERILE IRR 1000ML POUR (IV SOLUTION) ×4 IMPLANT
WRAP KNEE MAXI GEL POST OP (GAUZE/BANDAGES/DRESSINGS) ×2 IMPLANT
YANKAUER SUCT BULB TIP 10FT TU (MISCELLANEOUS) ×2 IMPLANT

## 2019-11-26 NOTE — Discharge Instructions (Signed)

## 2019-11-26 NOTE — Evaluation (Signed)
Physical Therapy Evaluation Patient Details Name: Vanessa Harmon MRN: [id number] DOB: [birth year] Today's Date: 11/26/2019   History of Present Illness  69 y.o. female admitted for L TKA on 11/26/19. PMH: recent L wrist fx for which she wears a splint and is NWB, polymyalgia rheumatica, HTN, scoliosis, asthma, numbness in feet, anxiety/depression, s/p spinal fusion T9-L2 2019.  Clinical Impression  Pt is s/p TKA resulting in the deficits listed below (see PT Problem List). Pt ambulated 35' with RW, no loss of balance. Initiated TKA HEP. Good progress expected.  Pt will benefit from skilled PT to increase their independence and safety with mobility to allow discharge to the venue listed below.      Follow Up Recommendations Follow surgeon's recommendation for DC plan and follow-up therapies;Outpatient PT    Equipment Recommendations  None recommended by PT    Recommendations for Other Services       Precautions / Restrictions Precautions Precautions: Knee Precaution Comments: instructed pt/spouse in no pillow under knee Required Braces or Orthoses: Splint/Cast Splint/Cast: L wrist Restrictions Weight Bearing Restrictions: Yes LUE Weight Bearing: Non weight bearing Other Position/Activity Restrictions: NWB LUE per pt 2* slow healing wrist fx      Mobility  Bed Mobility Overal bed mobility: Modified Independent             General bed mobility comments: used rail, HOB up  Transfers Overall transfer level: Needs assistance Equipment used: Rolling walker (2 wheeled);Left platform walker Transfers: Sit to/from Stand Sit to Stand: Min guard;From elevated surface         General transfer comment: VCs hand placement  Ambulation/Gait Ambulation/Gait assistance: Min guard Gait Distance (Feet): 60 Feet Assistive device: Left platform walker;Rolling walker (2 wheeled) Gait Pattern/deviations: Step-to pattern;Decreased stride length Gait velocity: decr   General Gait  Details: VCs sequencing, no loss of balance  Stairs            Wheelchair Mobility    Modified Rankin (Stroke Patients Only)       Balance Overall balance assessment: Modified Independent                                           Pertinent Vitals/Pain Pain Assessment: 0-10 Pain Score: 4  Pain Location: L knee Pain Descriptors / Indicators: Sore Pain Intervention(s): Limited activity within patient's tolerance;Monitored during session;Premedicated before session;Ice applied    Home Living Family/patient expects to be discharged to:: Private residence Living Arrangements: Spouse/significant other Available Help at Discharge: Family;Available 24 hours/day Type of Home: House Home Access: Stairs to enter Entrance Stairs-Rails: Left Entrance Stairs-Number of Steps: 3 Home Layout: Laundry or work area in basement;One level;Able to live on main level with bedroom/bathroom Home Equipment: Walker - 2 wheels;Grab bars - toilet;Toilet riser Additional Comments: pt has L platform for RW    Prior Function Level of Independence: Independent               Hand Dominance   Dominant Hand: Right    Extremity/Trunk Assessment   Upper Extremity Assessment Upper Extremity Assessment: LUE deficits/detail LUE Deficits / Details: L wrist in splint LUE: Unable to fully assess due to immobilization    Lower Extremity Assessment Lower Extremity Assessment: LLE deficits/detail LLE Deficits / Details: SLR 3/5 LLE Sensation: WNL LLE Coordination: WNL    Cervical / Trunk Assessment Cervical / Trunk Assessment: Normal  Communication   Communication:  No difficulties  Cognition Arousal/Alertness: Awake/alert Behavior During Therapy: WFL for tasks assessed/performed Overall Cognitive Status: Within Functional Limits for tasks assessed                                        General Comments      Exercises Total Joint Exercises Ankle  Circles/Pumps: AROM;Both;10 reps;Supine Long Arc Quad: AROM;Left;5 reps;Seated   Assessment/Plan    PT Assessment Patient needs continued PT services  PT Problem List Decreased strength;Decreased range of motion;Decreased activity tolerance;Decreased mobility;Pain       PT Treatment Interventions Gait training;Functional mobility training;Therapeutic exercise;Patient/family education;Therapeutic activities    PT Goals (Current goals can be found in the Care Plan section)  Acute Rehab PT Goals Patient Stated Goal: get a puppy, be able to squat to floor PT Goal Formulation: With patient/family Time For Goal Achievement: 12/03/19 Potential to Achieve Goals: Good    Frequency 7X/week   Barriers to discharge        Co-evaluation               AM-PAC PT "6 Clicks" Mobility  Outcome Measure Help needed turning from your back to your side while in a flat bed without using bedrails?: A Little Help needed moving from lying on your back to sitting on the side of a flat bed without using bedrails?: A Little Help needed moving to and from a bed to a chair (including a wheelchair)?: A Little Help needed standing up from a chair using your arms (e.g., wheelchair or bedside chair)?: A Little Help needed to walk in hospital room?: A Little Help needed climbing 3-5 steps with a railing? : A Lot 6 Click Score: 17    End of Session Equipment Utilized During Treatment: Gait belt Activity Tolerance: Patient tolerated treatment well Patient left: in chair;with call bell/phone within reach;with chair alarm set;with family/visitor present Nurse Communication: Mobility status PT Visit Diagnosis: Difficulty in walking, not elsewhere classified (R26.2);Pain;Muscle weakness (generalized) (M62.81) Pain - Right/Left: Left Pain - part of body: Knee    Time: 2197-5883 PT Time Calculation (min) (ACUTE ONLY): 55 min   Charges:   PT Evaluation $PT Eval Low Complexity: 1 Low PT  Treatments $Gait Training: 8-22 mins $Therapeutic Exercise: 8-22 mins $Therapeutic Activity: 8-22 mins      Blondell Reveal Kistler PT 11/26/2019  Acute Rehabilitation Services Pager [phone] Office [phone]

## 2019-11-26 NOTE — Op Note (Signed)
NAME:  Vanessa Harmon                      MEDICAL RECORD NO.:  [id number]                             FACILITY:  [facility]      PHYSICIAN:  Pietro Cassis. [name], M.D.  DATE OF BIRTH:  01-21-1951      DATE OF PROCEDURE:  11/26/2019                                     OPERATIVE REPORT         PREOPERATIVE DIAGNOSIS:  Left knee osteoarthritis.      POSTOPERATIVE DIAGNOSIS:  Left knee osteoarthritis.      FINDINGS:  The patient was noted to have complete loss of cartilage and   bone-on-bone arthritis with associated osteophytes in the lateral and patellofemoral compartments of   the knee with a significant synovitis and associated effusion.  The patient had failed months of conservative treatment including medications, injection therapy, activity modification.     PROCEDURE:  Left total knee replacement.      COMPONENTS USED:  DePuy Attune rotating platform posterior stabilized knee   system, a size 6N femur, 5 tibia, size 8 mm PS AOX insert, and 35 anatomic patellar   button.      SURGEON:  Pietro Cassis. [name], M.D.      ASSISTANT:  Griffith Citron, PA-C.      ANESTHESIA:  General and Regional.      SPECIMENS:  None.      COMPLICATION:  None.      DRAINS:  None.  EBL: <100cc      TOURNIQUET TIME:   Total Tourniquet Time Documented: Thigh (Left) - 27 minutes Total: Thigh (Left) - 27 minutes  .      The patient was stable to the recovery room.      INDICATION FOR PROCEDURE:  Vanessa Harmon is a 69 y.o. female patient of   mine.  The patient had been seen, evaluated, and treated for months conservatively in the   office with medication, activity modification, and injections.  The patient had   radiographic changes of bone-on-bone arthritis with endplate sclerosis and osteophytes noted.  Based on the radiographic changes and failed conservative measures, the patient   decided to proceed with definitive treatment, total knee replacement.  Risks of infection, DVT, component failure,  need for revision surgery, neurovascular injury were reviewed in the office setting.  The postop course was reviewed stressing the efforts to maximize post-operative satisfaction and function.  Consent was obtained for benefit of pain   relief.      PROCEDURE IN DETAIL:  The patient was brought to the operative theater.   Once adequate anesthesia, preoperative antibiotics, 2 gm of Ancef,1 gm of Tranexamic Acid, and 10 mg of Decadron administered, the patient was positioned supine with a left thigh tourniquet placed.  The  left lower extremity was prepped and draped in sterile fashion.  A time-   out was performed identifying the patient, planned procedure, and the appropriate extremity.      The left lower extremity was placed in the [facility] leg holder.  The leg was   exsanguinated, tourniquet elevated to 250 mmHg.  A midline incision was   made  followed by median parapatellar arthrotomy.  Following initial   exposure, attention was first directed to the patella.  Precut   measurement was noted to be 24 mm.  I resected down to 15 mm and used a   35 anatomic patellar button to restore patellar height as well as cover the cut surface.      The lug holes were drilled and a metal shim was placed to protect the   patella from retractors and saw blade during the procedure.      At this point, attention was now directed to the femur.  The femoral   canal was opened with a drill, irrigated to try to prevent fat emboli.  An   intramedullary rod was passed at 3 degrees valgus, 9 mm of bone was   resected off the distal femur.  Following this resection, the tibia was   subluxated anteriorly.  Using the extramedullary guide, 2 mm of bone was resected off   the proximal medial tibia.  We confirmed the gap would be   stable medially and laterally with a size 6 spacer block as well as confirmed that the tibial cut was perpendicular in the coronal plane, checking with an alignment rod.      Once this was  done, I sized the femur to be a size 6 in the anterior-   posterior dimension, chose a narrow component based on medial and   lateral dimension.  The size 6 rotation block was then pinned in   position anterior referenced using the C-clamp to set rotation.  The   anterior, posterior, and  chamfer cuts were made without difficulty nor   notching making certain that I was along the anterior cortex to help   with flexion gap stability.      The final box cut was made off the lateral aspect of distal femur.      At this point, the tibia was sized to be a size 5.  The size 5 tray was   then pinned in position through the medial third of the tubercle,   drilled, and keel punched.  Trial reduction was now carried with a 6 femur,  5 tibia, a size 8 mm PS insert, and the 35 anatomic patella botton.  The knee was brought to full extension with good flexion stability with the patella   tracking through the trochlea without application of pressure.  Given   all these findings the trial components removed.  Final components were   opened and cement was mixed.  The knee was irrigated with normal saline solution and pulse lavage.  The synovial lining was   then injected with 30 cc of 0.25% Marcaine with epinephrine, 1 cc of Toradol and 30 cc of NS for a total of 61 cc.     Final implants were then cemented onto cleaned and dried cut surfaces of bone with the knee brought to extension with a size 8 mm PS trial insert.      Once the cement had fully cured, excess cement was removed   throughout the knee.  I confirmed that I was satisfied with the range of   motion and stability, and the final size 8 mm PS AOX insert was chosen.  It was   placed into the knee.      The tourniquet had been let down at 27 minutes.  No significant   hemostasis was required.  The extensor mechanism was then reapproximated using #1 Vicryl and #  1 Stratafix sutures with the knee   in flexion.  The   remaining wound was closed  with 2-0 Vicryl and running 4-0 Monocryl.   The knee was cleaned, dried, dressed sterilely using Dermabond and   Aquacel dressing.  The patient was then   brought to recovery room in stable condition, tolerating the procedure   well.   Please note that Physician Assistant, Griffith Citron, PA-C was present for the entirety of the case, and was utilized for pre-operative positioning, peri-operative retractor management, general facilitation of the procedure and for primary wound closure at the end of the case.              Pietro Cassis [name], M.D.    11/26/2019 11:16 AM

## 2019-11-26 NOTE — Anesthesia Procedure Notes (Signed)
Procedure Name: LMA Insertion Date/Time: 11/26/2019 9:59 AM Performed by: Maxwell Caul, CRNA Pre-anesthesia Checklist: Patient identified, Emergency Drugs available, Suction available and Patient being monitored Patient Re-evaluated:Patient Re-evaluated prior to induction Oxygen Delivery Method: Circle system utilized Preoxygenation: Pre-oxygenation with 100% oxygen Induction Type: IV induction LMA: LMA inserted LMA Size: 4.0 Number of attempts: 1 Placement Confirmation: positive ETCO2 and breath sounds checked- equal and bilateral Tube secured with: Tape Dental Injury: Teeth and Oropharynx as per pre-operative assessment

## 2019-11-26 NOTE — Progress Notes (Signed)
Assisted Dr. Lillia Abed with Left Knee Adductor Canal  block. Side rails up, monitors on throughout procedure. See vital signs in flow sheet. Tolerated Procedure well.

## 2019-11-26 NOTE — Anesthesia Preprocedure Evaluation (Addendum)
Anesthesia Evaluation  Patient identified by MRN, date of birth, ID band Patient awake    Reviewed: Allergy & Precautions, NPO status , Patient's Chart, lab work & pertinent test results  Airway Mallampati: I  TM Distance: >3 FB Neck ROM: Full    Dental   Pulmonary asthma , former smoker,    Pulmonary exam normal        Cardiovascular hypertension, Pt. on medications Normal cardiovascular exam+ dysrhythmias Atrial Fibrillation      Neuro/Psych Anxiety Depression    GI/Hepatic GERD  Medicated and Controlled,  Endo/Other    Renal/GU      Musculoskeletal   Abdominal   Peds  Hematology   Anesthesia Other Findings   Reproductive/Obstetrics                             Anesthesia Physical Anesthesia Plan  ASA: II  Anesthesia Plan: General   Post-op Pain Management:  Regional for Post-op pain   Induction:   PONV Risk Score and Plan: 2 and Ondansetron and Midazolam  Airway Management Planned: LMA  Additional Equipment:   Intra-op Plan:   Post-operative Plan: Extubation in OR  Informed Consent: I have reviewed the patients History and Physical, chart, labs and discussed the procedure including the risks, benefits and alternatives for the proposed anesthesia with the patient or authorized representative who has indicated his/her understanding and acceptance.       Plan Discussed with: Surgeon and CRNA  Anesthesia Plan Comments:        Anesthesia Quick Evaluation

## 2019-11-26 NOTE — Anesthesia Postprocedure Evaluation (Signed)
Anesthesia Post Note  Patient: Vanessa Harmon  Procedure(s) Performed: TOTAL KNEE ARTHROPLASTY (Left Knee)     Patient location during evaluation: PACU Anesthesia Type: General Level of consciousness: awake and alert Pain management: pain level controlled Vital Signs Assessment: post-procedure vital signs reviewed and stable Respiratory status: spontaneous breathing, nonlabored ventilation, respiratory function stable and patient connected to nasal cannula oxygen Cardiovascular status: blood pressure returned to baseline and stable Postop Assessment: no apparent nausea or vomiting Anesthetic complications: no   No complications documented.  Last Vitals:  Vitals:   11/26/19 1300 11/26/19 1400  BP: (!) 141/68 119/60  Pulse: 69 66  Resp: 15 12  Temp: 36.6 C 36.6 C  SpO2: 91% 98%    Last Pain:  Vitals:   11/26/19 1400  TempSrc:   PainSc: 2                  Leann Mayweather DAVID

## 2019-11-26 NOTE — Anesthesia Procedure Notes (Signed)
Anesthesia Regional Block: Adductor canal block   Pre-Anesthetic Checklist: ,, timeout performed, Correct Patient, Correct Site, Correct Laterality, Correct Procedure, Correct Position, site marked, Risks and benefits discussed,  Surgical consent,  Pre-op evaluation,  At surgeon's request and post-op pain management  Laterality: Left  Prep: chloraprep       Needles:  Injection technique: Single-shot  Needle Type: Echogenic Stimulator Needle     Needle Length: 9cm  Needle Gauge: 21     Additional Needles:   Narrative:  Start time: 11/26/2019 9:15 AM End time: 11/26/2019 9:25 AM Injection made incrementally with aspirations every 5 mL.  Performed by: Personally  Anesthesiologist: Lillia Abed, MD  Additional Notes: Monitors applied. Patient sedated. Sterile prep and drape,hand hygiene and sterile gloves were used. Relevant anatomy identified.Needle position confirmed.Local anesthetic injected incrementally after negative aspiration. Local anesthetic spread visualized around nerve(s). Vascular puncture avoided. No complications. Image printed for medical record.The patient tolerated the procedure well.    Lillia Abed MD

## 2019-11-26 NOTE — Interval H&P Note (Signed)
History and Physical Interval Note:  11/26/2019 8:36 AM  Vanessa Harmon  has presented today for surgery, with the diagnosis of Left knee osteoarthritis.  The various methods of treatment have been discussed with the patient and family. After consideration of risks, benefits and other options for treatment, the patient has consented to  Procedure(s) with comments: TOTAL KNEE ARTHROPLASTY (Left) - 70 mins as a surgical intervention.  The patient's history has been reviewed, patient examined, no change in status, stable for surgery.  I have reviewed the patient's chart and labs.  Questions were answered to the patient's satisfaction.     Mauri Pole

## 2019-11-26 NOTE — Transfer of Care (Signed)
Immediate Anesthesia Transfer of Care Note  Patient: Vanessa Harmon  Procedure(s) Performed: TOTAL KNEE ARTHROPLASTY (Left Knee)  Patient Location: PACU  Anesthesia Type:General  Level of Consciousness: awake, alert  and oriented  Airway & Oxygen Therapy: Patient Spontanous Breathing and Patient connected to face mask oxygen  Post-op Assessment: Report given to RN and Post -op Vital signs reviewed and stable  Post vital signs: Reviewed and stable  Last Vitals:  Vitals Value Taken Time  BP 150/77 11/26/19 1135  Temp    Pulse 80 11/26/19 1137  Resp 12 11/26/19 1137  SpO2 96 % 11/26/19 1137  Vitals shown include unvalidated device data.  Last Pain:  Vitals:   11/26/19 0821  TempSrc:   PainSc: 0-No pain         Complications: No complications documented.

## 2019-11-27 ENCOUNTER — Encounter (HOSPITAL_COMMUNITY): Payer: Self-pay | Admitting: Orthopedic Surgery

## 2019-11-27 DIAGNOSIS — Z6828 Body mass index (BMI) 28.0-28.9, adult: Secondary | ICD-10-CM | POA: Diagnosis present

## 2019-11-27 DIAGNOSIS — M1712 Unilateral primary osteoarthritis, left knee: Secondary | ICD-10-CM | POA: Diagnosis not present

## 2019-11-27 DIAGNOSIS — E663 Overweight: Secondary | ICD-10-CM | POA: Diagnosis present

## 2019-11-27 LAB — BASIC METABOLIC PANEL
Anion gap: 6 (ref 5–15)
BUN: 12 mg/dL (ref 8–23)
CO2: 26 mmol/L (ref 22–32)
Calcium: 7.9 mg/dL — ABNORMAL LOW (ref 8.9–10.3)
Chloride: 97 mmol/L — ABNORMAL LOW (ref 98–111)
Creatinine, Ser: 0.7 mg/dL (ref 0.44–1.00)
GFR calc Af Amer: >60 mL/min (ref >60)
GFR calc non Af Amer: >60 mL/min (ref >60)
Glucose, Bld: 87 mg/dL (ref 70–99)
Potassium: 4.8 mmol/L (ref 3.5–5.1)
Sodium: 129 mmol/L — ABNORMAL LOW (ref 135–145)

## 2019-11-27 LAB — CBC
HCT: 28.9 % — ABNORMAL LOW (ref 36.0–46.0)
Hemoglobin: 9.5 g/dL — ABNORMAL LOW (ref 12.0–15.0)
MCH: 32.6 pg (ref 26.0–34.0)
MCHC: 32.9 g/dL (ref 30.0–36.0)
MCV: 99.3 fL (ref 80.0–100.0)
Platelets: 232 10*3/uL (ref 150–400)
RBC: 2.91 MIL/uL — ABNORMAL LOW (ref 3.87–5.11)
RDW: 13.5 % (ref 11.5–15.5)
WBC: 18.2 10*3/uL — ABNORMAL HIGH (ref 4.0–10.5)
nRBC: 0 % (ref 0.0–0.2)

## 2019-11-27 MED ORDER — DOCUSATE SODIUM 100 MG PO CAPS
100.0000 mg | ORAL_CAPSULE | Freq: Two times a day (BID) | ORAL | 0 refills | Status: DC
Start: 2019-11-27 — End: 2020-02-18

## 2019-11-27 MED ORDER — POLYETHYLENE GLYCOL 3350 17 G PO PACK
17.0000 g | PACK | Freq: Two times a day (BID) | ORAL | 0 refills | Status: DC
Start: 2019-11-27 — End: 2020-02-18

## 2019-11-27 MED ORDER — FERROUS SULFATE 325 (65 FE) MG PO TABS: 325.0000 mg | ORAL_TABLET | Freq: Three times a day (TID) | ORAL | 0 refills | Status: DC

## 2019-11-27 MED ORDER — METHOCARBAMOL 500 MG PO TABS: 500.0000 mg | ORAL_TABLET | Freq: Four times a day (QID) | ORAL | 0 refills | Status: DC | PRN

## 2019-11-27 MED ORDER — ASPIRIN 81 MG PO CHEW
81.0000 mg | CHEWABLE_TABLET | Freq: Two times a day (BID) | ORAL | 0 refills | Status: AC
Start: 2019-11-28 — End: 2019-12-28

## 2019-11-27 MED ORDER — HYDROCODONE-ACETAMINOPHEN 7.5-325 MG PO TABS: 1.0000 | ORAL_TABLET | ORAL | 0 refills | Status: DC | PRN

## 2019-11-27 NOTE — Discharge Summary (Signed)
Patient ID: Vanessa Harmon MRN: [id number] DOB/AGE: [birth year] 70 y.o.  Admit date: 11/26/2019 Discharge date: 11/27/2019  Admission Diagnoses:  Principal Problem:   Left knee OA Active Problems:   Status post total left knee replacement   Overweight (BMI 25.0-29.9)   Discharge Diagnoses:  Same  Past Medical History:  Diagnosis Date  . Adenomatous polyp   . Anxiety    takes Ativan daily as needed  . Arthritis    inflammatory arthritis (Dr. Dagoberto Ligas)  . Asthma   . Chronic back pain    scoliosis   . Constipation    takes miralax every other day  . Depression    takes Lexapro daily  . Dysrhythmia    PSVT  . Eczema   . Essential hypertension, benign    takes Hyzaar and Metoprolol daily  . Family history of adverse reaction to anesthesia    Sister PONV  . Fibromyalgia   . GERD (gastroesophageal reflux disease)   . History of blood transfusion    pt. denies  . History of bronchitis 5 rys ago  . History of colon polyps   . History of shingles   . Hyperlipidemia    takes Atorvastatin daily  . Insomnia    takes Melatonin nightly  . Joint pain   . Osteoarthritis   . Pneumonia as a child  . PSVT (paroxysmal supraventricular tachycardia) ([place])   . Recurrent upper respiratory infection (URI)   . Rheumatoid arthritis ([place])   . Rhinitis, allergic    uses Flonase daily  . Urinary urgency   . Urticaria     Surgeries: Procedure(s): LEFT TOTAL KNEE ARTHROPLASTY on 11/26/2019   Consultants: N/A  Discharged Condition: Improved  Hospital Course: Vanessa Harmon is an 69 y.o. female who was admitted 11/26/2019 for operative treatment ofOsteoarthritis of left knee. Patient has severe unremitting pain that affects sleep, daily activities, and work/hobbies. After pre-op clearance the patient was taken to the operating room on 11/26/2019 and underwent  Procedure(s):  LEFT TOTAL KNEE ARTHROPLASTY.    Patient was given perioperative antibiotics:  Anti-infectives  (From admission, onward)   Start     Dose/Rate Route Frequency Ordered Stop   11/26/19 1800  valACYclovir (VALTREX) tablet 1,000 mg     Discontinue     1,000 mg Oral Daily 11/26/19 1456     11/26/19 1600  ceFAZolin (ANCEF) IVPB 2g/100 mL premix        2 g 200 mL/hr over 30 Minutes Intravenous Every 6 hours 11/26/19 1455 11/26/19 2203   11/26/19 0730  ceFAZolin (ANCEF) IVPB 2g/100 mL premix        2 g 200 mL/hr over 30 Minutes Intravenous On call to O.R. 11/26/19 0728 11/26/19 1023   11/26/19 0730  vancomycin (VANCOCIN) IVPB 1000 mg/200 mL premix        1,000 mg 200 mL/hr over 60 Minutes Intravenous  Once 11/26/19 0727 11/26/19 1032       Patient was given sequential compression devices, early ambulation, and chemoprophylaxis to prevent DVT.  Patient benefited maximally from hospital stay and there were no complications.    Recent vital signs:  Patient Vitals for the past 24 hrs:  BP Temp Temp src Pulse Resp SpO2 Height Weight  11/27/19 0603 (!) 94/48 98.4 F (36.9 C) Oral 64 17 99 % -- --  11/27/19 0137 (!) 96/57 98.2 F (36.8 C) Oral 63 17 100 % -- --  11/26/19 2212 119/65 98.3 F (36.8 C) Oral 63 18  100 % -- --  11/26/19 2049 -- -- -- -- -- 99 % -- --  11/26/19 1734 -- -- -- -- -- -- 5\' 6"  (1.676 m) 75.3 kg  11/26/19 1640 138/67 98 F (36.7 C) Oral 64 16 99 % -- --  11/26/19 1536 (!) 114/50 98.3 F (36.8 C) Oral 66 16 100 % -- --  11/26/19 1435 (!) 121/54 -- -- 69 14 100 % -- --  11/26/19 1400 119/60 97.8 F (36.6 C) -- 66 12 98 % -- --  11/26/19 1300 (!) 141/68 97.8 F (36.6 C) -- 69 15 91 % -- --  11/26/19 1245 (!) 159/77 -- -- 71 18 95 % -- --  11/26/19 1230 (!) 156/70 97.8 F (36.6 C) -- 71 15 99 % -- --  11/26/19 1215 (!) 150/64 -- -- 73 13 90 % -- --  11/26/19 1206 -- -- -- 71 11 95 % -- --  11/26/19 1200 (!) 165/75 -- -- 72 10 100 % -- --  11/26/19 1145 (!) 158/70 -- -- 76 15 100 % -- --  11/26/19 1135 (!) 150/77 97.8 F (36.6 C) -- 77 15 (!) 86 % -- --   11/26/19 0946 115/65 -- -- 63 17 97 % -- --  11/26/19 0941 114/65 -- -- 65 11 97 % -- --  11/26/19 0940 -- -- -- 66 16 98 % -- --  11/26/19 0939 -- -- -- 63 16 98 % -- --  11/26/19 0938 -- -- -- 71 12 98 % -- --  11/26/19 0937 -- -- -- 70 11 97 % -- --  11/26/19 0936 114/62 -- -- 63 11 98 % -- --  11/26/19 0935 -- -- -- 61 15 98 % -- --  11/26/19 0934 -- -- -- 69 18 98 % -- --  11/26/19 0933 -- -- -- 60 10 96 % -- --  11/26/19 0932 -- -- -- 61 10 97 % -- --  11/26/19 0931 96/72 -- -- 85 18 96 % -- --  11/26/19 0930 -- -- -- 62 (!) 8 97 % -- --  11/26/19 0929 -- -- -- 60 (!) 7 93 % -- --  11/26/19 0928 -- -- -- (!) 45 12 90 % -- --  11/26/19 0927 -- -- -- 65 (!) 7 (!) 88 % -- --  11/26/19 0926 108/64 -- -- (!) 45 (!) 7 93 % -- --  11/26/19 0925 -- -- -- (!) 49 (!) 9 100 % -- --  11/26/19 0924 -- -- -- (!) 50 12 100 % -- --  11/26/19 0923 -- -- -- (!) 50 10 100 % -- --  11/26/19 0922 -- -- -- (!) 49 10 100 % -- --  11/26/19 0821 -- -- -- -- -- -- -- 75.3 kg     Recent laboratory studies:  Recent Labs    11/27/19 0244  WBC 18.2*  HGB 9.5*  HCT 28.9*  PLT 232  NA 129*  K 4.8  CL 97*  CO2 26  BUN 12  CREATININE 0.70  GLUCOSE 87  CALCIUM 7.9*     Discharge Medications:   Allergies as of 11/27/2019      Reactions   Breo Ellipta [fluticasone Furoate-vilanterol] Other (See Comments)   Severe hoarseness    Fosamax [alendronate Sodium] Other (See Comments)   myalgias   Levaquin [levofloxacin] Other (See Comments)   Lightheadedness, not feeling well, ear pain   Infliximab Other (See Comments)   Skin reaction  Spiriva Handihaler [tiotropium Bromide Monohydrate]    UNSPECIFIED REACTION    Verapamil    Tape Rash   PAPER TAPE: Causes severe rash and weeping      Medication List    STOP taking these medications   aspirin 81 MG tablet Replaced by: aspirin 81 MG chewable tablet   indomethacin 75 MG CR capsule Commonly known as: INDOCIN SR     TAKE these  medications   albuterol 108 (90 Base) MCG/ACT inhaler Commonly known as: ProAir HFA Inhale two puffs every 4-6 hours if needed for cough or wheeze. What changed:   how much to take  how to take this  when to take this  reasons to take this   aspirin 81 MG chewable tablet Commonly known as: Aspirin Childrens Chew 1 tablet (81 mg total) by mouth 2 (two) times daily. Take for 4 weeks, then resume regular dose. Start taking on: November 28, 2019 Replaces: aspirin 81 MG tablet   atorvastatin 80 MG tablet Commonly known as: LIPITOR TAKE 1/2 BY MOUTH ONCE DAILY What changed:   how much to take  how to take this  when to take this  additional instructions   beclomethasone 80 MCG/ACT inhaler Commonly known as: QVAR Inhale 2 puffs into the lungs 2 (two) times daily.   carboxymethylcellulose 0.5 % Soln Commonly known as: REFRESH PLUS Place 1 drop into both eyes 3 (three) times daily as needed (dry eyes).   celecoxib 400 MG capsule Commonly known as: CELEBREX Take 200 mg by mouth daily after breakfast.   chlorthalidone 25 MG tablet Commonly known as: HYGROTON Take 12.5 mg by mouth daily.   denosumab 60 MG/ML Sosy injection Commonly known as: PROLIA Inject 60 mg into the skin every 6 (six) months.   dextromethorphan-guaiFENesin 30-600 MG 12hr tablet Commonly known as: MUCINEX DM Take 1 tablet by mouth daily as needed for cough.   diphenhydrAMINE 25 MG tablet Commonly known as: BENADRYL Take 25 mg by mouth daily as needed for allergies.   docusate sodium 100 MG capsule Commonly known as: Colace Take 1 capsule (100 mg total) by mouth 2 (two) times daily.   escitalopram 20 MG tablet Commonly known as: LEXAPRO TAKE 1 TABLET BY MOUTH DAILY   famotidine 40 MG tablet Commonly known as: PEPCID TAKE 1 TABLET (40 MG TOTAL) BY MOUTH AT BEDTIME.   ferrous sulfate 325 (65 FE) MG tablet Commonly known as: FerrouSul Take 1 tablet (325 mg total) by mouth 3 (three) times  daily with meals for 14 days.   FISH OIL PO Take 1,000 mg by mouth daily.   fluticasone 50 MCG/ACT nasal spray Commonly known as: FLONASE Use one spray in each nostril twice daily What changed:   how much to take  how to take this  when to take this   furosemide 20 MG tablet Commonly known as: LASIX TAKE 1 TABLET (20 MG TOTAL) BY MOUTH DAILY. What changed: how much to take   gabapentin 400 MG capsule Commonly known as: NEURONTIN Take 400 mg by mouth 3 (three) times daily.   HYDROcodone-acetaminophen 7.5-325 MG tablet Commonly known as: Norco Take 1-2 tablets by mouth every 4 (four) hours as needed for moderate pain.   irbesartan 300 MG tablet Commonly known as: AVAPRO Take 300 mg by mouth daily.   methocarbamol 500 MG tablet Commonly known as: Robaxin Take 1 tablet (500 mg total) by mouth every 6 (six) hours as needed for muscle spasms.   montelukast 10 MG tablet  Commonly known as: SINGULAIR Take 1 tablet (10 mg total) by mouth at bedtime.   multivitamin capsule Take 1 capsule by mouth daily.   Olopatadine HCl 0.6 % Soln Place 2 sprays into the nose in the morning and at bedtime.   omeprazole 40 MG capsule Commonly known as: PRILOSEC TAKE ONE CAPSULE BY MOUTH DAILY BEFORE BREAKFAST DAILY What changed:   how much to take  how to take this  when to take this   Orencia 250 MG injection Generic drug: abatacept Inject into the vein every 30 (thirty) days.   polyethylene glycol 17 g packet Commonly known as: MIRALAX / GLYCOLAX Take 17 g by mouth 2 (two) times daily.   predniSONE 5 MG tablet Commonly known as: DELTASONE Take 5 mg by mouth daily with breakfast.   pseudoephedrine 30 MG tablet Commonly known as: SUDAFED Take 30 mg by mouth daily as needed for congestion.   traZODone 50 MG tablet Commonly known as: DESYREL Take 100 mg by mouth at bedtime.   valACYclovir 1000 MG tablet Commonly known as: VALTREX Take 1,000 mg by mouth daily.    vitamin C 500 MG tablet Commonly known as: ASCORBIC ACID Take 1,000 mg by mouth daily.   VITAMIN D3 PO Take 50 mcg by mouth daily.   Xiidra 5 % Soln Generic drug: Lifitegrast Place 1 drop into both eyes in the morning and at bedtime.            Discharge Care Instructions  (From admission, onward)         Start     Ordered   11/27/19 0000  Change dressing       Comments: Maintain surgical dressing until follow up in the clinic. If the edges start to pull up, may reinforce with tape. If the dressing is no longer working, may remove and cover with gauze and tape, but must keep the area dry and clean.  Call with any questions or concerns.   11/27/19 0756          Diagnostic Studies: No results found.  Disposition: Home  Discharge Instructions    Call MD / Call 911   Complete by: As directed    If you experience chest pain or shortness of breath, CALL 911 and be transported to the hospital emergency room.  If you develope a fever above 101 F, pus (white drainage) or increased drainage or redness at the wound, or calf pain, call your surgeon's office.   Change dressing   Complete by: As directed    Maintain surgical dressing until follow up in the clinic. If the edges start to pull up, may reinforce with tape. If the dressing is no longer working, may remove and cover with gauze and tape, but must keep the area dry and clean.  Call with any questions or concerns.   Constipation Prevention   Complete by: As directed    Drink plenty of fluids.  Prune juice may be helpful.  You may use a stool softener, such as Colace (over the counter) 100 mg twice a day.  Use MiraLax (over the counter) for constipation as needed.   Diet - low sodium heart healthy   Complete by: As directed    Discharge instructions   Complete by: As directed    Maintain surgical dressing until follow up in the clinic. If the edges start to pull up, may reinforce with tape. If the dressing is no longer  working, may remove and cover with gauze  and tape, but must keep the area dry and clean.  Follow up in 2 weeks at [facility]. Call with any questions or concerns.   Increase activity slowly as tolerated   Complete by: As directed    Weight bearing as tolerated with assist device (walker, cane, etc) as directed, use it as long as suggested by your surgeon or therapist, typically at least 4-6 weeks.   TED hose   Complete by: As directed    Use stockings (TED hose) for 2 weeks on both leg(s).  You may remove them at night for sleeping.       Follow-up Information    Paralee Cancel, MD. Schedule an appointment as soon as possible for a visit in 2 weeks.   Specialty: Orthopedic Surgery Contact information: [address] [place] [address] [phone]                Signed: Lucille Passy [facility] 11/27/2019, 7:56 AM

## 2019-11-27 NOTE — Progress Notes (Signed)
     Subjective: 1 Day Post-Op Procedure(s) (LRB): TOTAL KNEE ARTHROPLASTY (Left)   Patient reports pain as mild, pain controlled. No reported events throughout the night.  Dr. [name] discussed the procedure, findings and expectations moving forward.  Ready to be discharged home, if they do well with PT.  Follow up in the clinic in 2 weeks.  Knows to call with any questions or concerns.     Patient's anticipated LOS is less than 2 midnights, meeting these requirements: - Lives within 1 hour of care - Has a competent adult at home to recover with post-op recover - NO history of  - Chronic pain requiring opiods  - Diabetes  - Coronary Artery Disease  - Heart failure  - Heart attack  - Stroke  - DVT/VTE  - Cardiac arrhythmia  - Respiratory Failure/COPD  - Renal failure  - Anemia  - Advanced Liver disease    Objective:   VITALS:   Vitals:   11/27/19 0137 11/27/19 0603  BP: (!) 96/57 (!) 94/48  Pulse: 63 64  Resp: 17 17  Temp: 98.2 F (36.8 C) 98.4 F (36.9 C)  SpO2: 100% 99%    Dorsiflexion/Plantar flexion intact Incision: dressing C/D/I No cellulitis present Compartment soft  LABS Recent Labs    11/27/19 0244  HGB 9.5*  HCT 28.9*  WBC 18.2*  PLT 232    Recent Labs    11/27/19 0244  NA 129*  K 4.8  BUN 12  CREATININE 0.70  GLUCOSE 87     Assessment/Plan: 1 Day Post-Op Procedure(s) (LRB): TOTAL KNEE ARTHROPLASTY (Left) Foley cath d/c'ed Advance diet Up with therapy D/C IV fluids Discharge home Follow up in 2 weeks at [facility] Follow up with OLIN,Feliciana Narayan D in 2 weeks.  Contact information:  [facility] [address] [place] [address] [phone]    Overweight (BMI 25-29.9) Estimated body mass index is 26.79 kg/m as calculated from the following:   Height as of this encounter: 5\' 6"  (1.676 m).   Weight as of this encounter: 75.3 kg. Patient also counseled that weight may inhibit the healing  process Patient counseled that losing weight will help with future health issues       Danae Orleans PA-C  [facility]  [place] [address], [address], [place], [place] [address] Phone: [phone] [email or web address] Facebook  [employer]

## 2019-11-27 NOTE — Plan of Care (Signed)

## 2019-11-27 NOTE — Progress Notes (Signed)
Physical Therapy Treatment Patient Details Name: Vanessa Harmon MRN: [id number] DOB: July 07, 1950 Today's Date: 11/27/2019    History of Present Illness 69 y.o. female admitted for L TKA on 11/26/19. PMH: recent L wrist fx for which she wears a splint and is NWB, polymyalgia rheumatica, HTN, scoliosis, asthma, numbness in feet, anxiety/depression, s/p spinal fusion T9-L2 2019.    PT Comments    Pt ambulated 240' with L PFRW, no loss of balance. Stair training completed. Pt demonstrates good understanding of HEP. She is ready to DC home from PT standpoint.    Follow Up Recommendations  Follow surgeon's recommendation for DC plan and follow-up therapies;Outpatient PT     Equipment Recommendations  None recommended by PT    Recommendations for Other Services       Precautions / Restrictions Precautions Precautions: Knee Precaution Booklet Issued: Yes (comment) Precaution Comments: instructed pt/spouse in no pillow under knee Required Braces or Orthoses: Splint/Cast Splint/Cast: L wrist Restrictions LUE Weight Bearing:  (WBAT LLE) Other Position/Activity Restrictions: NWB LUE per pt 2* slow healing wrist fx    Mobility  Bed Mobility Overal bed mobility: Modified Independent             General bed mobility comments: used rail, HOB up  Transfers Overall transfer level: Modified independent Equipment used: Rolling walker (2 wheeled);Left platform walker Transfers: Sit to/from Stand Sit to Stand: Modified independent (Device/Increase time)            Ambulation/Gait Ambulation/Gait assistance: Modified independent (Device/Increase time) Gait Distance (Feet): 240 Feet Assistive device: Left platform walker;Rolling walker (2 wheeled) Gait Pattern/deviations: Step-to pattern;Decreased stride length Gait velocity: decr   General Gait Details: no loss of balance   Stairs Stairs: Yes Stairs assistance: Min guard;Supervision Stair Management: One rail  Left;Sideways;Step to pattern Number of Stairs: 3 General stair comments: VCs sequencing, spouse present for stair training   Wheelchair Mobility    Modified Rankin (Stroke Patients Only)       Balance Overall balance assessment: Modified Independent                                          Cognition Arousal/Alertness: Awake/alert Behavior During Therapy: WFL for tasks assessed/performed Overall Cognitive Status: Within Functional Limits for tasks assessed                                        Exercises Total Joint Exercises Ankle Circles/Pumps: AROM;Both;10 reps;Supine Quad Sets: AROM;Left;5 reps;Supine Short Arc Quad: AROM;Left;10 reps;Supine Heel Slides: AAROM;Left;10 reps;Supine Hip ABduction/ADduction: AROM;Left;10 reps;Supine Straight Leg Raises: AROM;Left;5 reps;Supine Long Arc Quad: AROM;Left;5 reps;Seated Knee Flexion: AAROM;Left;10 reps;Seated Goniometric ROM: 3-90* AAROM L knee    General Comments        Pertinent Vitals/Pain Pain Score: 4  Pain Location: L knee Pain Descriptors / Indicators: Sore Pain Intervention(s): Limited activity within patient's tolerance;Monitored during session;Premedicated before session;Ice applied    Home Living                      Prior Function            PT Goals (current goals can now be found in the care plan section) Acute Rehab PT Goals Patient Stated Goal: get a puppy, be able to squat to floor, hike PT Goal  Formulation: With patient/family Time For Goal Achievement: 12/03/19 Potential to Achieve Goals: Good Progress towards PT goals: Goals met/education completed, patient discharged from PT    Frequency    7X/week      PT Plan Current plan remains appropriate    Co-evaluation              AM-PAC PT "6 Clicks" Mobility   Outcome Measure  Help needed turning from your back to your side while in a flat bed without using bedrails?: None Help needed  moving from lying on your back to sitting on the side of a flat bed without using bedrails?: None Help needed moving to and from a bed to a chair (including a wheelchair)?: None Help needed standing up from a chair using your arms (e.g., wheelchair or bedside chair)?: None Help needed to walk in hospital room?: None Help needed climbing 3-5 steps with a railing? : None 6 Click Score: 24    End of Session Equipment Utilized During Treatment: Gait belt Activity Tolerance: Patient tolerated treatment well Patient left: in chair;with call bell/phone within reach;with chair alarm set;with family/visitor present Nurse Communication: Mobility status PT Visit Diagnosis: Difficulty in walking, not elsewhere classified (R26.2);Pain;Muscle weakness (generalized) (M62.81) Pain - Right/Left: Left Pain - part of body: Knee     Time: 5686-1683 PT Time Calculation (min) (ACUTE ONLY): 36 min  Charges:  $Gait Training: 8-22 mins $Therapeutic Exercise: 8-22 mins                     Blondell Reveal Kistler PT 11/27/2019  Acute Rehabilitation Services Pager [phone] Office [phone]

## 2020-01-09 ENCOUNTER — Encounter (INDEPENDENT_AMBULATORY_CARE_PROVIDER_SITE_OTHER): Payer: Medicare Other | Admitting: Ophthalmology

## 2020-01-21 ENCOUNTER — Encounter (INDEPENDENT_AMBULATORY_CARE_PROVIDER_SITE_OTHER): Payer: Self-pay | Admitting: Ophthalmology

## 2020-01-21 ENCOUNTER — Other Ambulatory Visit: Payer: Self-pay

## 2020-01-21 ENCOUNTER — Ambulatory Visit (INDEPENDENT_AMBULATORY_CARE_PROVIDER_SITE_OTHER): Payer: Medicare Other | Admitting: Ophthalmology

## 2020-01-21 DIAGNOSIS — H33311 Horseshoe tear of retina without detachment, right eye: Secondary | ICD-10-CM | POA: Diagnosis not present

## 2020-01-21 DIAGNOSIS — H43311 Vitreous membranes and strands, right eye: Secondary | ICD-10-CM | POA: Diagnosis not present

## 2020-01-21 DIAGNOSIS — H43312 Vitreous membranes and strands, left eye: Secondary | ICD-10-CM | POA: Diagnosis not present

## 2020-01-21 DIAGNOSIS — H43811 Vitreous degeneration, right eye: Secondary | ICD-10-CM

## 2020-01-21 DIAGNOSIS — H4311 Vitreous hemorrhage, right eye: Secondary | ICD-10-CM | POA: Diagnosis not present

## 2020-01-21 DIAGNOSIS — Z961 Presence of intraocular lens: Secondary | ICD-10-CM

## 2020-01-21 NOTE — Progress Notes (Signed)
01/21/2020     CHIEF COMPLAINT Patient presents for Retina Follow Up   HISTORY OF PRESENT ILLNESS: Vanessa Harmon is a 69 y.o. female who presents to the clinic today for:   HPI    Retina Follow Up    Patient presents with  Other.  In left eye.  This started 7 months ago.  Severity is mild.  Duration of 7 months.  Since onset it is stable.          Comments    7 Month F/U OU  Pt c/o intermittent blurriness OS in the mornings upon waking. OD VA stable. No other new symptoms reported OU.       Last edited by Rockie Neighbours, [place] on 01/21/2020  1:22 PM. (History)      Referring physician: Juanell Fairly, MD [place],  [place] [id number]  HISTORICAL INFORMATION:   Selected notes from the MEDICAL RECORD NUMBER    Lab Results  Component Value Date   HGBA1C 5.9 (H) 04/13/2017     CURRENT MEDICATIONS: Current Outpatient Medications (Ophthalmic Drugs)  Medication Sig  . carboxymethylcellulose (REFRESH PLUS) 0.5 % SOLN Place 1 drop into both eyes 3 (three) times daily as needed (dry eyes).   Marland Kitchen Lifitegrast (XIIDRA) 5 % SOLN Place 1 drop into both eyes in the morning and at bedtime.    No current facility-administered medications for this visit. (Ophthalmic Drugs)   Current Outpatient Medications (Other)  Medication Sig  . abatacept (ORENCIA) 250 MG injection Inject into the vein every 30 (thirty) days.   Marland Kitchen albuterol (PROAIR HFA) 108 (90 Base) MCG/ACT inhaler Inhale two puffs every 4-6 hours if needed for cough or wheeze. (Patient taking differently: Inhale 2 puffs into the lungs every 4 (four) hours as needed for wheezing or shortness of breath. Inhale two puffs every 4-6 hours if needed for cough or wheeze.)  . atorvastatin (LIPITOR) 80 MG tablet TAKE 1/2 BY MOUTH ONCE DAILY (Patient taking differently: Take 80 mg by mouth at bedtime. )  . beclomethasone (QVAR) 80 MCG/ACT inhaler Inhale 2 puffs into the lungs 2 (two) times daily.   .  celecoxib (CELEBREX) 400 MG capsule Take 200 mg by mouth daily after breakfast.  . chlorthalidone (HYGROTON) 25 MG tablet Take 12.5 mg by mouth daily.  . Cholecalciferol (VITAMIN D3 PO) Take 50 mcg by mouth daily.  Marland Kitchen denosumab (PROLIA) 60 MG/ML SOSY injection Inject 60 mg into the skin every 6 (six) months.  . dextromethorphan-guaiFENesin (MUCINEX DM) 30-600 MG 12hr tablet Take 1 tablet by mouth daily as needed for cough.  . diphenhydrAMINE (BENADRYL) 25 MG tablet Take 25 mg by mouth daily as needed for allergies.  Marland Kitchen docusate sodium (COLACE) 100 MG capsule Take 1 capsule (100 mg total) by mouth 2 (two) times daily.  Marland Kitchen escitalopram (LEXAPRO) 20 MG tablet TAKE 1 TABLET BY MOUTH DAILY (Patient taking differently: Take 20 mg by mouth daily. )  . famotidine (PEPCID) 40 MG tablet TAKE 1 TABLET (40 MG TOTAL) BY MOUTH AT BEDTIME.  . ferrous sulfate (FERROUSUL) 325 (65 FE) MG tablet Take 1 tablet (325 mg total) by mouth 3 (three) times daily with meals for 14 days.  . fluticasone (FLONASE) 50 MCG/ACT nasal spray Use one spray in each nostril twice daily (Patient taking differently: Place 1 spray into both nostrils in the morning and at bedtime. Use one spray in each nostril twice daily)  . furosemide (LASIX) 20 MG tablet  TAKE 1 TABLET (20 MG TOTAL) BY MOUTH DAILY. (Patient taking differently: Take 10 mg by mouth daily. )  . gabapentin (NEURONTIN) 400 MG capsule Take 400 mg by mouth 3 (three) times daily.   Marland Kitchen HYDROcodone-acetaminophen (NORCO) 7.5-325 MG tablet Take 1-2 tablets by mouth every 4 (four) hours as needed for moderate pain.  Marland Kitchen irbesartan (AVAPRO) 300 MG tablet Take 300 mg by mouth daily.   . methocarbamol (ROBAXIN) 500 MG tablet Take 1 tablet (500 mg total) by mouth every 6 (six) hours as needed for muscle spasms.  . montelukast (SINGULAIR) 10 MG tablet Take 1 tablet (10 mg total) by mouth at bedtime.  . Multiple Vitamin (MULTIVITAMIN) capsule Take 1 capsule by mouth daily.  . Olopatadine HCl 0.6  % SOLN Place 2 sprays into the nose in the morning and at bedtime.  . Omega-3 Fatty Acids (FISH OIL PO) Take 1,000 mg by mouth daily.  Marland Kitchen omeprazole (PRILOSEC) 40 MG capsule TAKE ONE CAPSULE BY MOUTH DAILY BEFORE BREAKFAST DAILY (Patient taking differently: Take 40 mg by mouth daily. TAKE ONE CAPSULE BY MOUTH DAILY BEFORE BREAKFAST DAILY)  . polyethylene glycol (MIRALAX / GLYCOLAX) 17 g packet Take 17 g by mouth 2 (two) times daily.  . predniSONE (DELTASONE) 5 MG tablet Take 5 mg by mouth daily with breakfast.   . pseudoephedrine (SUDAFED) 30 MG tablet Take 30 mg by mouth daily as needed for congestion.   . traZODone (DESYREL) 50 MG tablet Take 100 mg by mouth at bedtime.   . valACYclovir (VALTREX) 1000 MG tablet Take 1,000 mg by mouth daily.   . vitamin C (ASCORBIC ACID) 500 MG tablet Take 1,000 mg by mouth daily.    No current facility-administered medications for this visit. (Other)      REVIEW OF SYSTEMS:    ALLERGIES Allergies  Allergen Reactions  . Breo Ellipta [Fluticasone Furoate-Vilanterol] Other (See Comments)    Severe hoarseness   . Fosamax [Alendronate Sodium] Other (See Comments)    myalgias  . Levaquin [Levofloxacin] Other (See Comments)    Lightheadedness, not feeling well, ear pain  . Infliximab Other (See Comments)    Skin reaction  . Spiriva Handihaler [Tiotropium Bromide Monohydrate]     UNSPECIFIED REACTION   . Verapamil   . Tape Rash    PAPER TAPE: Causes severe rash and weeping    PAST MEDICAL HISTORY Past Medical History:  Diagnosis Date  . Adenomatous polyp   . Anxiety    takes Ativan daily as needed  . Arthritis    inflammatory arthritis (Dr. Dagoberto Ligas)  . Asthma   . Chronic back pain    scoliosis   . Constipation    takes miralax every other day  . Depression    takes Lexapro daily  . Dysrhythmia    PSVT  . Eczema   . Essential hypertension, benign    takes Hyzaar and Metoprolol daily  . Family history of adverse reaction to  anesthesia    Sister PONV  . Fibromyalgia   . GERD (gastroesophageal reflux disease)   . History of blood transfusion    pt. denies  . History of bronchitis 5 rys ago  . History of colon polyps   . History of shingles   . Hyperlipidemia    takes Atorvastatin daily  . Insomnia    takes Melatonin nightly  . Joint pain   . Osteoarthritis   . Pneumonia as a child  . PSVT (paroxysmal supraventricular tachycardia) ([place])   .  Recurrent upper respiratory infection (URI)   . Rheumatoid arthritis ([place])   . Rhinitis, allergic    uses Flonase daily  . Urinary urgency   . Urticaria    Past Surgical History:  Procedure Laterality Date  . ADENOIDECTOMY    . APPENDECTOMY    . BACK SURGERY  2012   at age 61 d/t scoliosis  . BLEPHAROPLASTY  2018  . BREAST BIOPSY    . cataract surgery    . COLONOSCOPY    . KNEE ARTHROSCOPY Bilateral   . neuroma removed from foot     unsure of which foot  . OPEN REDUCTION INTERNAL FIXATION (ORIF) DISTAL RADIAL FRACTURE Left 08/15/2019   Procedure: OPEN REDUCTION INTERNAL FIXATION (ORIF) DISTAL RADIAL FRACTURE;  Surgeon: Leanora Cover, MD;  Location: [facility];  Service: Orthopedics;  Laterality: Left;  block in preop  . POSTERIOR LUMBAR FUSION 4 LEVEL N/A 02/15/2018   Procedure: Decompression and fusion Thoracic nine to Lumbar two with exploration of previous fusion;  Surgeon: Erline Levine, MD;  Location: [facility];  Service: Neurosurgery;  Laterality: N/A;  . SHOULDER SURGERY Right   . TONSILLECTOMY    . TOTAL HIP ARTHROPLASTY Right 04/15/2014   Procedure: RIGHT TOTAL HIP ARTHROPLASTY ANTERIOR APPROACH;  Surgeon: Renette Butters, MD;  Location: [facility];  Service: Orthopedics;  Laterality: Right;  . TOTAL KNEE ARTHROPLASTY Left 11/26/2019   Procedure: TOTAL KNEE ARTHROPLASTY;  Surgeon: Paralee Cancel, MD;  Location: [facility];  Service: Orthopedics;  Laterality: Left;  70 mins    FAMILY HISTORY Family History  Problem Relation Age of Onset  .  Cancer Mother   . Stroke Mother   . Allergic rhinitis Mother   . Allergic rhinitis Father   . AAA (abdominal aortic aneurysm) Father   . COPD Father   . Hypertension Other        unspecified grandmother  . AAA (abdominal aortic aneurysm) Other        unspecified grandfather  . Cancer Other        unspecified grandfather  . Neuropathy Neg Hx     SOCIAL HISTORY Social History   Tobacco Use  . Smoking status: Former Smoker    Quit date: 1998    Years since quitting: 23.7  . Smokeless tobacco: Never Used  . Tobacco comment: hasn't smoked in 83yrs  Vaping Use  . Vaping Use: Never used  Substance Use Topics  . Alcohol use: Yes    Alcohol/week: 1.0 standard drink    Types: 1 Glasses of wine per week    Comment: rarely with dinner  . Drug use: No         OPHTHALMIC EXAM: Base Eye Exam    Visual Acuity (ETDRS)      Right Left   Dist cc 20/25 -2 20/25 +1   Correction: Glasses       Tonometry (Tonopen, 1:26 PM)      Right Left   Pressure 8 12       Pupils      Pupils Dark Light Shape React APD   Right PERRL 4 3 Round Brisk None   Left PERRL 4 3 Round Brisk None       Visual Fields (Counting fingers)      Left Right    Full Full       Extraocular Movement      Right Left    Full Full       Neuro/Psych    Oriented  x3: Yes   Mood/Affect: Normal       Dilation    Both eyes: 1.0% Mydriacyl, 2.5% Phenylephrine @ 1:26 PM          IMAGING AND PROCEDURES  Imaging and Procedures for 01/21/20  Color Fundus Photography Optos - OU - Both Eyes       Right Eye Progression has worsened.   Left Eye Progression has been stable. Disc findings include normal observations. Macula : normal observations. Vessels : normal observations. Periphery : normal observations.   Notes   OD, Chorioretinal scar centered at 7:00 meridian at the equator, and peripheral to this at the 8:00 meridian is a horseshoe flap retinal tear, with no subretinal fluid.   OS also  clear media yet with PVD.       Repair Retinal Breaks, Laser - OD - Right Eye       Tear locations include inferior.   Time Out Confirmed correct patient, procedure, site, and patient consented.   Anesthesia Topical anesthesia was used. Anesthetic medications included Proparacaine 0.5%.   Laser Information The type of laser was diode. Color was yellow. The duration in seconds was 0.03. The spot size was 390 microns. Laser power was 280. Total spots was 188.   Post-op The patient tolerated the procedure well. There were no complications. The patient received written and verbal post procedure care education.   Notes 3 rows of retinopexy applied around the retinal tear inferotemporally                ASSESSMENT/PLAN:  Vitreous membranes and strands, left The nature of vitreous floaters was discussed with the patient as well as their frequent occurrence with development of posterior vitreous detachment. The significance of flashes and field cuts was discussed with the patient. The need for a dilated fundus examination was discussed and advice given to return immediately for new or different floaters .  More uncommonly, vitreous floaters, debris, or clouds of haze may develop with impact on visual functioning.  If the personal impact to the patient is minor, observation usually results in diminishing symptoms.  If visual impact hampers activity of daily living over a period of time, medical interventions are available to change the condition, including laser vitreolysis or more commonly, surgical intervention to remove the visual impactful floaters.  OU, with no change in perceived defects in vision.  Hampered by her current symptoms.  Horseshoe retinal tear of right eye Asymptomatic retinal tear, horseshoe at the 8 o'clock position inferotemporal right eye.  Pigmentary reaction, will need to recommend laser retinopexy  Vitreous membranes and strands, right New recent symptoms  OU.      ICD-10-CM   1. Horseshoe retinal tear of right eye  H33.311 Color Fundus Photography Optos - OU - Both Eyes    Repair Retinal Breaks, Laser - OD - Right Eye  2. Vitreous hemorrhage of right eye ([place])  H43.11   3. Vitreous membranes and strands, right  H43.311   4. Vitreous membranes and strands, left  H43.312   5. Posterior vitreous detachment of right eye  H43.811   6. Pseudophakia  Z96.1     1.  2.  3.  Ophthalmic Meds Ordered this visit:  No orders of the defined types were placed in this encounter.      Return in about 6 weeks (around 03/03/2020), or Laser retinopexy of the right eye today recommended, for dilate, OD, COLOR FP.  Patient Instructions  Patient to report any new onset sparkling  lights, curtain of darkness or profound worsening current floaters in either eye    Explained the diagnoses, plan, and follow up with the patient and they expressed understanding.  Patient expressed understanding of the importance of proper follow up care.   Clent Demark Amram Maya M.D. Diseases & Surgery of the Retina and Vitreous Retina & Diabetic [place] 01/21/20     Abbreviations: M myopia (nearsighted); A astigmatism; H hyperopia (farsighted); P presbyopia; Mrx spectacle prescription;  CTL contact lenses; OD right eye; OS left eye; OU both eyes  XT exotropia; ET esotropia; PEK punctate epithelial keratitis; PEE punctate epithelial erosions; DES dry eye syndrome; MGD meibomian gland dysfunction; ATs artificial tears; PFAT's preservative free artificial tears; [place] nuclear sclerotic cataract; PSC posterior subcapsular cataract; ERM epi-retinal membrane; PVD posterior vitreous detachment; RD retinal detachment; DM diabetes mellitus; DR diabetic retinopathy; NPDR non-proliferative diabetic retinopathy; PDR proliferative diabetic retinopathy; CSME clinically significant macular edema; DME diabetic macular edema; dbh dot blot hemorrhages; CWS cotton wool spot; POAG primary open  angle glaucoma; C/D cup-to-disc ratio; HVF humphrey visual field; GVF goldmann visual field; OCT optical coherence tomography; IOP intraocular pressure; BRVO Branch retinal vein occlusion; CRVO central retinal vein occlusion; CRAO central retinal artery occlusion; BRAO branch retinal artery occlusion; RT retinal tear; SB scleral buckle; PPV pars plana vitrectomy; VH Vitreous hemorrhage; PRP panretinal laser photocoagulation; IVK intravitreal kenalog; VMT vitreomacular traction; MH Macular hole;  NVD neovascularization of the disc; NVE neovascularization elsewhere; AREDS age related eye disease study; ARMD age related macular degeneration; POAG primary open angle glaucoma; EBMD epithelial/anterior basement membrane dystrophy; ACIOL anterior chamber intraocular lens; IOL intraocular lens; PCIOL posterior chamber intraocular lens; Phaco/IOL phacoemulsification with intraocular lens placement; [place] photorefractive keratectomy; LASIK laser assisted in situ keratomileusis; HTN hypertension; DM diabetes mellitus; COPD chronic obstructive pulmonary disease

## 2020-01-21 NOTE — Assessment & Plan Note (Signed)
The nature of vitreous floaters was discussed with the patient as well as their frequent occurrence with development of posterior vitreous detachment. The significance of flashes and field cuts was discussed with the patient. The need for a dilated fundus examination was discussed and advice given to return immediately for new or different floaters .  More uncommonly, vitreous floaters, debris, or clouds of haze may develop with impact on visual functioning.  If the personal impact to the patient is minor, observation usually results in diminishing symptoms.  If visual impact hampers activity of daily living over a period of time, medical interventions are available to change the condition, including laser vitreolysis or more commonly, surgical intervention to remove the visual impactful floaters.  OU, with no change in perceived defects in vision.  Hampered by her current symptoms.

## 2020-01-21 NOTE — Patient Instructions (Signed)
Patient to report any new onset sparkling lights, curtain of darkness or profound worsening current floaters in either eye

## 2020-01-21 NOTE — Assessment & Plan Note (Signed)
New recent symptoms OU.

## 2020-01-21 NOTE — Assessment & Plan Note (Signed)
Asymptomatic retinal tear, horseshoe at the 8 o'clock position inferotemporal right eye.  Pigmentary reaction, will need to recommend laser retinopexy

## 2020-01-27 DIAGNOSIS — T84498A Other mechanical complication of other internal orthopedic devices, implants and grafts, initial encounter: Secondary | ICD-10-CM | POA: Insufficient documentation

## 2020-01-30 ENCOUNTER — Other Ambulatory Visit: Payer: Self-pay | Admitting: Orthopedic Surgery

## 2020-01-30 DIAGNOSIS — S52572K Other intraarticular fracture of lower end of left radius, subsequent encounter for closed fracture with nonunion: Secondary | ICD-10-CM

## 2020-02-03 ENCOUNTER — Other Ambulatory Visit: Payer: Self-pay | Admitting: Neurosurgery

## 2020-02-03 DIAGNOSIS — T84498A Other mechanical complication of other internal orthopedic devices, implants and grafts, initial encounter: Secondary | ICD-10-CM

## 2020-02-13 ENCOUNTER — Other Ambulatory Visit: Payer: Self-pay

## 2020-02-13 ENCOUNTER — Ambulatory Visit
Admission: RE | Admit: 2020-02-13 | Discharge: 2020-02-13 | Disposition: A | Discharge status: Home / Self Care | Payer: Medicare Other | Source: Ambulatory Visit | Attending: Orthopedic Surgery | Admitting: Orthopedic Surgery

## 2020-02-13 DIAGNOSIS — S52572K Other intraarticular fracture of lower end of left radius, subsequent encounter for closed fracture with nonunion: Secondary | ICD-10-CM

## 2020-02-18 ENCOUNTER — Other Ambulatory Visit: Payer: Self-pay | Admitting: Orthopedic Surgery

## 2020-02-18 ENCOUNTER — Other Ambulatory Visit: Payer: Self-pay

## 2020-02-18 ENCOUNTER — Encounter (HOSPITAL_BASED_OUTPATIENT_CLINIC_OR_DEPARTMENT_OTHER): Payer: Self-pay | Admitting: Orthopedic Surgery

## 2020-02-18 ENCOUNTER — Other Ambulatory Visit: Payer: Self-pay | Admitting: Neurosurgery

## 2020-02-18 DIAGNOSIS — T84498A Other mechanical complication of other internal orthopedic devices, implants and grafts, initial encounter: Secondary | ICD-10-CM

## 2020-02-19 ENCOUNTER — Ambulatory Visit
Admission: RE | Admit: 2020-02-19 | Discharge: 2020-02-19 | Disposition: A | Discharge status: Home / Self Care | Payer: Medicare Other | Source: Ambulatory Visit | Attending: Neurosurgery | Admitting: Neurosurgery

## 2020-02-19 DIAGNOSIS — T84498A Other mechanical complication of other internal orthopedic devices, implants and grafts, initial encounter: Secondary | ICD-10-CM

## 2020-02-20 ENCOUNTER — Encounter (HOSPITAL_BASED_OUTPATIENT_CLINIC_OR_DEPARTMENT_OTHER)
Admission: RE | Admit: 2020-02-20 | Discharge: 2020-02-20 | Disposition: A | Discharge status: Home / Self Care | Payer: Medicare Other | Source: Ambulatory Visit | Attending: Orthopedic Surgery | Admitting: Orthopedic Surgery

## 2020-02-20 ENCOUNTER — Other Ambulatory Visit (HOSPITAL_COMMUNITY)
Admission: RE | Admit: 2020-02-20 | Discharge: 2020-02-20 | Disposition: A | Discharge status: Home / Self Care | Payer: Medicare Other | Source: Ambulatory Visit | Attending: Orthopedic Surgery | Admitting: Orthopedic Surgery

## 2020-02-20 DIAGNOSIS — Z20822 Contact with and (suspected) exposure to covid-19: Secondary | ICD-10-CM | POA: Insufficient documentation

## 2020-02-20 DIAGNOSIS — Z01812 Encounter for preprocedural laboratory examination: Secondary | ICD-10-CM | POA: Diagnosis present

## 2020-02-20 DIAGNOSIS — Z01818 Encounter for other preprocedural examination: Secondary | ICD-10-CM | POA: Insufficient documentation

## 2020-02-20 LAB — BASIC METABOLIC PANEL
Anion gap: 11 (ref 5–15)
BUN: 14 mg/dL (ref 8–23)
CO2: 27 mmol/L (ref 22–32)
Calcium: 9.2 mg/dL (ref 8.9–10.3)
Chloride: 90 mmol/L — ABNORMAL LOW (ref 98–111)
Creatinine, Ser: 0.81 mg/dL (ref 0.44–1.00)
GFR, Estimated: >60 mL/min (ref >60)
Glucose, Bld: 140 mg/dL — ABNORMAL HIGH (ref 70–99)
Potassium: 4.8 mmol/L (ref 3.5–5.1)
Sodium: 128 mmol/L — ABNORMAL LOW (ref 135–145)

## 2020-02-20 LAB — SARS CORONAVIRUS 2 (TAT 6-24 HRS): SARS Coronavirus 2: NEGATIVE

## 2020-02-20 NOTE — Progress Notes (Signed)

## 2020-02-21 ENCOUNTER — Other Ambulatory Visit: Payer: Self-pay | Admitting: Neurosurgery

## 2020-02-24 ENCOUNTER — Ambulatory Visit (HOSPITAL_BASED_OUTPATIENT_CLINIC_OR_DEPARTMENT_OTHER): Payer: Medicare Other | Admitting: Certified Registered"

## 2020-02-24 ENCOUNTER — Encounter (HOSPITAL_BASED_OUTPATIENT_CLINIC_OR_DEPARTMENT_OTHER): Payer: Self-pay | Admitting: Orthopedic Surgery

## 2020-02-24 ENCOUNTER — Other Ambulatory Visit: Payer: Self-pay

## 2020-02-24 ENCOUNTER — Ambulatory Visit (HOSPITAL_BASED_OUTPATIENT_CLINIC_OR_DEPARTMENT_OTHER)
Admission: RE | Admit: 2020-02-24 | Discharge: 2020-02-24 | Disposition: A | Discharge status: Home / Self Care | Payer: Medicare Other | Attending: Orthopedic Surgery | Admitting: Orthopedic Surgery

## 2020-02-24 ENCOUNTER — Encounter (HOSPITAL_BASED_OUTPATIENT_CLINIC_OR_DEPARTMENT_OTHER)
Admission: RE | Disposition: A | Discharge status: Home / Self Care | Payer: Self-pay | Source: Home / Self Care | Attending: Orthopedic Surgery

## 2020-02-24 DIAGNOSIS — Z8249 Family history of ischemic heart disease and other diseases of the circulatory system: Secondary | ICD-10-CM | POA: Diagnosis not present

## 2020-02-24 DIAGNOSIS — Z823 Family history of stroke: Secondary | ICD-10-CM | POA: Insufficient documentation

## 2020-02-24 DIAGNOSIS — M069 Rheumatoid arthritis, unspecified: Secondary | ICD-10-CM | POA: Insufficient documentation

## 2020-02-24 DIAGNOSIS — Z9109 Other allergy status, other than to drugs and biological substances: Secondary | ICD-10-CM | POA: Diagnosis not present

## 2020-02-24 DIAGNOSIS — E785 Hyperlipidemia, unspecified: Secondary | ICD-10-CM | POA: Insufficient documentation

## 2020-02-24 DIAGNOSIS — Z7952 Long term (current) use of systemic steroids: Secondary | ICD-10-CM | POA: Insufficient documentation

## 2020-02-24 DIAGNOSIS — I10 Essential (primary) hypertension: Secondary | ICD-10-CM | POA: Insufficient documentation

## 2020-02-24 DIAGNOSIS — K219 Gastro-esophageal reflux disease without esophagitis: Secondary | ICD-10-CM | POA: Diagnosis not present

## 2020-02-24 DIAGNOSIS — M797 Fibromyalgia: Secondary | ICD-10-CM | POA: Diagnosis not present

## 2020-02-24 DIAGNOSIS — Z472 Encounter for removal of internal fixation device: Secondary | ICD-10-CM | POA: Diagnosis present

## 2020-02-24 DIAGNOSIS — Z87891 Personal history of nicotine dependence: Secondary | ICD-10-CM | POA: Insufficient documentation

## 2020-02-24 DIAGNOSIS — Z7982 Long term (current) use of aspirin: Secondary | ICD-10-CM | POA: Diagnosis not present

## 2020-02-24 DIAGNOSIS — Z888 Allergy status to other drugs, medicaments and biological substances status: Secondary | ICD-10-CM | POA: Diagnosis not present

## 2020-02-24 DIAGNOSIS — Z881 Allergy status to other antibiotic agents status: Secondary | ICD-10-CM | POA: Insufficient documentation

## 2020-02-24 DIAGNOSIS — J45909 Unspecified asthma, uncomplicated: Secondary | ICD-10-CM | POA: Insufficient documentation

## 2020-02-24 DIAGNOSIS — Z7951 Long term (current) use of inhaled steroids: Secondary | ICD-10-CM | POA: Insufficient documentation

## 2020-02-24 DIAGNOSIS — Z809 Family history of malignant neoplasm, unspecified: Secondary | ICD-10-CM | POA: Diagnosis not present

## 2020-02-24 HISTORY — PX: HARDWARE REMOVAL: SHX979

## 2020-02-24 SURGERY — REMOVAL, HARDWARE
Anesthesia: General | Laterality: Left

## 2020-02-24 MED ORDER — OXYCODONE HCL 5 MG PO TABS
5.0000 mg | ORAL_TABLET | Freq: Once | ORAL | Status: AC | PRN
  Administered 2020-02-24: 5 mg via ORAL

## 2020-02-24 MED ORDER — DEXAMETHASONE SODIUM PHOSPHATE 10 MG/ML IJ SOLN
INTRAMUSCULAR | Status: AC
  Filled 2020-02-24: qty 1

## 2020-02-24 MED ORDER — EPHEDRINE 5 MG/ML INJ
INTRAVENOUS | Status: AC
  Filled 2020-02-24: qty 10

## 2020-02-24 MED ORDER — LACTATED RINGERS IV SOLN: INTRAVENOUS | Status: DC

## 2020-02-24 MED ORDER — ONDANSETRON HCL 4 MG/2ML IJ SOLN
INTRAMUSCULAR | Status: AC
  Filled 2020-02-24: qty 2

## 2020-02-24 MED ORDER — OXYCODONE HCL 5 MG/5ML PO SOLN: 5.0000 mg | Freq: Once | ORAL | Status: AC | PRN

## 2020-02-24 MED ORDER — LIDOCAINE 2% (20 MG/ML) 5 ML SYRINGE
INTRAMUSCULAR | Status: AC
  Filled 2020-02-24: qty 5

## 2020-02-24 MED ORDER — ONDANSETRON HCL 4 MG/2ML IJ SOLN
INTRAMUSCULAR | Status: DC | PRN
  Administered 2020-02-24: 4 mg via INTRAVENOUS

## 2020-02-24 MED ORDER — HYDROMORPHONE HCL 1 MG/ML IJ SOLN: 0.2500 mg | INTRAMUSCULAR | Status: DC | PRN

## 2020-02-24 MED ORDER — EPHEDRINE SULFATE 50 MG/ML IJ SOLN
INTRAMUSCULAR | Status: DC | PRN
  Administered 2020-02-24 (×2): 5 mg via INTRAVENOUS

## 2020-02-24 MED ORDER — DEXAMETHASONE SODIUM PHOSPHATE 10 MG/ML IJ SOLN
INTRAMUSCULAR | Status: DC | PRN
  Administered 2020-02-24: 5 mg via INTRAVENOUS

## 2020-02-24 MED ORDER — FENTANYL CITRATE (PF) 100 MCG/2ML IJ SOLN
INTRAMUSCULAR | Status: DC | PRN
  Administered 2020-02-24 (×2): 25 ug via INTRAVENOUS

## 2020-02-24 MED ORDER — OXYCODONE HCL 5 MG PO TABS
ORAL_TABLET | ORAL | Status: AC
  Filled 2020-02-24: qty 1

## 2020-02-24 MED ORDER — BUPIVACAINE HCL (PF) 0.25 % IJ SOLN
INTRAMUSCULAR | Status: AC
  Filled 2020-02-24: qty 30

## 2020-02-24 MED ORDER — PROPOFOL 10 MG/ML IV BOLUS
INTRAVENOUS | Status: DC | PRN
  Administered 2020-02-24: 150 mg via INTRAVENOUS

## 2020-02-24 MED ORDER — HYDROCODONE-ACETAMINOPHEN 5-325 MG PO TABS: ORAL_TABLET | ORAL | 0 refills | Status: DC

## 2020-02-24 MED ORDER — LIDOCAINE HCL (CARDIAC) PF 100 MG/5ML IV SOSY
PREFILLED_SYRINGE | INTRAVENOUS | Status: DC | PRN
  Administered 2020-02-24: 60 mg via INTRAVENOUS

## 2020-02-24 MED ORDER — FENTANYL CITRATE (PF) 100 MCG/2ML IJ SOLN
INTRAMUSCULAR | Status: AC
  Filled 2020-02-24: qty 2

## 2020-02-24 MED ORDER — PROMETHAZINE HCL 25 MG/ML IJ SOLN: 6.2500 mg | INTRAMUSCULAR | Status: DC | PRN

## 2020-02-24 MED ORDER — CEFAZOLIN SODIUM-DEXTROSE 2-4 GM/100ML-% IV SOLN
2.0000 g | INTRAVENOUS | Status: AC
  Administered 2020-02-24: 2 g via INTRAVENOUS

## 2020-02-24 MED ORDER — BUPIVACAINE HCL (PF) 0.25 % IJ SOLN
INTRAMUSCULAR | Status: DC | PRN
  Administered 2020-02-24: 5 mL

## 2020-02-24 MED ORDER — AMISULPRIDE (ANTIEMETIC) 5 MG/2ML IV SOLN: 10.0000 mg | Freq: Once | INTRAVENOUS | Status: DC | PRN

## 2020-02-24 MED ORDER — PROPOFOL 10 MG/ML IV BOLUS
INTRAVENOUS | Status: AC
  Filled 2020-02-24: qty 20

## 2020-02-24 SURGICAL SUPPLY — 49 items
APL PRP STRL LF DISP 70% ISPRP (MISCELLANEOUS) ×1
BLADE MINI RND TIP GREEN BEAV (BLADE) IMPLANT
BLADE SURG 15 STRL LF DISP TIS (BLADE) ×2 IMPLANT
BLADE SURG 15 STRL SS (BLADE) ×4
BNDG CMPR 9X4 STRL LF SNTH (GAUZE/BANDAGES/DRESSINGS) ×1
BNDG COHESIVE 1X5 TAN STRL LF (GAUZE/BANDAGES/DRESSINGS) IMPLANT
BNDG ELASTIC 2X5.8 VLCR STR LF (GAUZE/BANDAGES/DRESSINGS) IMPLANT
BNDG ELASTIC 3X5.8 VLCR STR LF (GAUZE/BANDAGES/DRESSINGS) ×2 IMPLANT
BNDG ESMARK 4X9 LF (GAUZE/BANDAGES/DRESSINGS) ×1 IMPLANT
BNDG GAUZE ELAST 4 BULKY (GAUZE/BANDAGES/DRESSINGS) ×2 IMPLANT
CHLORAPREP W/TINT 26 (MISCELLANEOUS) ×2 IMPLANT
CORD BIPOLAR FORCEPS 12FT (ELECTRODE) ×2 IMPLANT
COVER BACK TABLE 60X90IN (DRAPES) ×2 IMPLANT
COVER MAYO STAND STRL (DRAPES) ×2 IMPLANT
COVER WAND RF STERILE (DRAPES) IMPLANT
CUFF TOURN SGL QUICK 18X4 (TOURNIQUET CUFF) ×2 IMPLANT
DECANTER SPIKE VIAL GLASS SM (MISCELLANEOUS) ×1 IMPLANT
DRAPE EXTREMITY T 121X128X90 (DISPOSABLE) ×2 IMPLANT
DRAPE SURG 17X23 STRL (DRAPES) ×2 IMPLANT
DRSG PAD ABDOMINAL 8X10 ST (GAUZE/BANDAGES/DRESSINGS) IMPLANT
GAUZE SPONGE 4X4 12PLY STRL (GAUZE/BANDAGES/DRESSINGS) ×2 IMPLANT
GAUZE XEROFORM 1X8 LF (GAUZE/BANDAGES/DRESSINGS) ×2 IMPLANT
GLOVE BIO SURGEON STRL SZ7.5 (GLOVE) ×2 IMPLANT
GLOVE BIOGEL PI IND STRL 8 (GLOVE) ×1 IMPLANT
GLOVE BIOGEL PI INDICATOR 8 (GLOVE) ×1
GOWN STRL REUS W/ TWL LRG LVL3 (GOWN DISPOSABLE) ×1 IMPLANT
GOWN STRL REUS W/TWL LRG LVL3 (GOWN DISPOSABLE) ×2
GOWN STRL REUS W/TWL XL LVL3 (GOWN DISPOSABLE) ×2 IMPLANT
NDL HYPO 25X1 1.5 SAFETY (NEEDLE) IMPLANT
NEEDLE HYPO 25X1 1.5 SAFETY (NEEDLE) ×2 IMPLANT
NS IRRIG 1000ML POUR BTL (IV SOLUTION) ×2 IMPLANT
PACK BASIN DAY SURGERY FS (CUSTOM PROCEDURE TRAY) ×2 IMPLANT
PAD CAST 3X4 CTTN HI CHSV (CAST SUPPLIES) IMPLANT
PADDING CAST ABS 4INX4YD NS (CAST SUPPLIES) ×1
PADDING CAST ABS COTTON 4X4 ST (CAST SUPPLIES) ×1 IMPLANT
PADDING CAST COTTON 3X4 STRL (CAST SUPPLIES) ×2
SPLINT PLASTER CAST XFAST 3X15 (CAST SUPPLIES) IMPLANT
SPLINT PLASTER XTRA FASTSET 3X (CAST SUPPLIES) ×10
STOCKINETTE 4X48 STRL (DRAPES) ×2 IMPLANT
STRIP CLOSURE SKIN 1/4X4 (GAUZE/BANDAGES/DRESSINGS) IMPLANT
SUT ETHILON 4 0 PS 2 18 (SUTURE) ×2 IMPLANT
SUT MNCRL AB 4-0 PS2 18 (SUTURE) IMPLANT
SUT VIC AB 3-0 FS2 27 (SUTURE) IMPLANT
SUT VIC AB 4-0 P-3 18XBRD (SUTURE) IMPLANT
SUT VIC AB 4-0 P3 18 (SUTURE) ×2
SYR BULB EAR ULCER 3OZ GRN STR (SYRINGE) ×2 IMPLANT
SYR CONTROL 10ML LL (SYRINGE) ×1 IMPLANT
TOWEL GREEN STERILE FF (TOWEL DISPOSABLE) ×4 IMPLANT
UNDERPAD 30X36 HEAVY ABSORB (UNDERPADS AND DIAPERS) ×2 IMPLANT

## 2020-02-24 NOTE — Op Note (Signed)
NAME: Vanessa Harmon MEDICAL RECORD NO: [id number] DATE OF BIRTH: Nov 25, 1950 FACILITY: [facility] LOCATION: [facility] PHYSICIAN: Tennis Must, MD   OPERATIVE REPORT   DATE OF PROCEDURE: 02/24/20    PREOPERATIVE DIAGNOSIS:   Left wrist retained hardware   POSTOPERATIVE DIAGNOSIS:   Left wrist retained hardware   PROCEDURE:   Removal of screws from left distal radius plate   SURGEON:  Leanora Cover, M.D.   ASSISTANT: none   ANESTHESIA:  General   INTRAVENOUS FLUIDS:  Per anesthesia flow sheet.   ESTIMATED BLOOD LOSS:  Minimal.   COMPLICATIONS:  None.   SPECIMENS:  none   TOURNIQUET TIME:    Total Tourniquet Time Documented: Upper Arm (Left) - 31 minutes Total: Upper Arm (Left) - 31 minutes    DISPOSITION:  Stable to PACU.   INDICATIONS: 69 year old female status post open reduction internal fixation left comminuted intra-articular distal radius fracture.  She has had development of prominence of the screws in the joint.  She wishes to proceed with removal of the prominent screws. Risks, benefits and alternatives of surgery were discussed including the risks of blood loss, infection, damage to nerves, vessels, tendons, ligaments, bone for surgery, need for additional surgery, complications with wound healing, continued pain, stiffness.  She voiced understanding of these risks and elected to proceed.  OPERATIVE COURSE:  After being identified preoperatively by myself,  the patient and I agreed on the procedure and site of the procedure.  The surgical site was marked.  Surgical consent had been signed. She was given IV antibiotics as preoperative antibiotic prophylaxis. She was transferred to the operating room and placed on the operating table in supine position with the Left upper extremity on an arm board.  General anesthesia was induced by the anesthesiologist.  Left upper extremity was prepped and draped in normal sterile orthopedic fashion.  A surgical pause  was performed between the surgeons, anesthesia, and operating room staff and all were in agreement as to the patient, procedure, and site of procedure.  Tourniquet at the proximal aspect of the extremity was inflated to 250 mmHg after exsanguination of the arm with an Esmarch bandage.    Previous incision was followed at its distal aspect.  This was carried into subcutaneous tissues by spreading technique.  The superficial and deep portions of the FCR tendon sheath were opened and the FCR and FPL swept ulnarly to protect the palmar tendons branch of the median nerve.  The plate was identified.  It had some prominence at the radial side and was palpable through the skin.  Was elevated slightly and a freer was able to be slipped underneath at the radial side.  The distalmost styloid screw was identified and removed.  The ulnar 3 pegs were removed.  C-arm was used in AP lateral and oblique projections to ensure removal of the appropriate screws.  This was the case.  There is no apparent remaining intra-articular penetration of the remaining 2 pegs.  The wound was copiously irrigated with sterile saline.  The soft tissue was repaired back over top of the plate with a 4-0 Vicryl suture in a figure-of-eight fashion.  The skin was closed with 4-0 nylon in a horizontal mattress fashion.  The wound was injected with quarter percent plain Marcaine to aid in postoperative analgesia.  Was dressed with sterile Xeroform 4 x 4's and wrapped with a Kerlix bandage.  Volar splints placed and wrapped with Kerlix and Ace bandage.  The tourniquet was  deflated at 31 minutes.  Fingertips were pink with brisk capillary refill after deflation of tourniquet.  The operative  drapes were broken down.  The patient was awoken from anesthesia safely.  She was transferred back to the stretcher and taken to PACU in stable condition.  I will see her back in the office in 1 week for postoperative followup.  I will give her a prescription for Norco  5/325 1-2 tabs PO q6 hours prn pain, dispense # 20.   Leanora Cover, MD Electronically signed, 02/24/20

## 2020-02-24 NOTE — Discharge Instructions (Signed)
  Post Anesthesia Home Care Instructions  Activity: Get plenty of rest for the remainder of the day. A responsible individual must stay with you for 24 hours following the procedure.  For the next 24 hours, DO NOT: -Drive a car -Operate machinery -Drink alcoholic beverages -Take any medication unless instructed by your physician -Make any legal decisions or sign important papers.  Meals: Start with liquid foods such as gelatin or soup. Progress to regular foods as tolerated. Avoid greasy, spicy, heavy foods. If nausea and/or vomiting occur, drink only clear liquids until the nausea and/or vomiting subsides. Call your physician if vomiting continues.  Special Instructions/Symptoms: Your throat may feel dry or sore from the anesthesia or the breathing tube placed in your throat during surgery. If this causes discomfort, gargle with warm salt water. The discomfort should disappear within 24 hours.  If you had a scopolamine patch placed behind your ear for the management of post- operative nausea and/or vomiting:  1. The medication in the patch is effective for 72 hours, after which it should be removed.  Wrap patch in a tissue and discard in the trash. Wash hands thoroughly with soap and water. 2. You may remove the patch earlier than 72 hours if you experience unpleasant side effects which may include dry mouth, dizziness or visual disturbances. 3. Avoid touching the patch. Wash your hands with soap and water after contact with the patch.       Call your surgeon if you experience:   1.  Fever over 101.0. 2.  Inability to urinate. 3.  Nausea and/or vomiting. 4.  Extreme swelling or bruising at the surgical site. 5.  Continued bleeding from the incision. 6.  Increased pain, redness or drainage from the incision. 7.  Problems related to your pain medication. 8.  Any problems and/or concerns  [facility] Instructions Hand Surgery  Wound Care: Keep your hand elevated above the level  of your heart.  Do not allow it to dangle by your side.  Keep the dressing dry and do not remove it unless your doctor advises you to do so.  He will usually change it at the time of your post-op visit.  Moving your fingers is advised to stimulate circulation but will depend on the site of your surgery.  If you have a splint applied, your doctor will advise you regarding movement.  Activity: Do not drive or operate machinery today.  Rest today and then you may return to your normal activity and work as indicated by your physician.  Diet:  Drink liquids today or eat a light diet.  You may resume a regular diet tomorrow.    General expectations: Pain for two to three days. Fingers may become slightly swollen.  Call your doctor if any of the following occur: Severe pain not relieved by pain medication. Elevated temperature. Dressing soaked with blood. Inability to move fingers. White or bluish color to fingers.  

## 2020-02-24 NOTE — Transfer of Care (Signed)
Immediate Anesthesia Transfer of Care Note  Patient: Vanessa Harmon  Procedure(s) Performed: HARDWARE REMOVAL LEFT DISTAL RADIUS (Left )  Patient Location: PACU  Anesthesia Type:General  Level of Consciousness: awake, alert  and oriented  Airway & Oxygen Therapy: Patient Spontanous Breathing and Patient connected to face mask oxygen  Post-op Assessment: Report given to RN and Post -op Vital signs reviewed and stable  Post vital signs: Reviewed and stable  Last Vitals:  Vitals Value Taken Time  BP 145/67 02/24/20 1409  Temp    Pulse 68 02/24/20 1410  Resp 9 02/24/20 1410  SpO2 100 % 02/24/20 1410  Vitals shown include unvalidated device data.  Last Pain:  Vitals:   02/24/20 1154  TempSrc: Oral  PainSc: 4       Patients Stated Pain Goal: 4 (12/20/99 4996)  Complications: No complications documented.

## 2020-02-24 NOTE — Anesthesia Procedure Notes (Signed)
Procedure Name: LMA Insertion Date/Time: 02/24/2020 1:19 PM Performed by: Lavonia Dana, CRNA Pre-anesthesia Checklist: Patient identified, Emergency Drugs available, Suction available and Patient being monitored Patient Re-evaluated:Patient Re-evaluated prior to induction Oxygen Delivery Method: Circle system utilized Preoxygenation: Pre-oxygenation with 100% oxygen Induction Type: IV induction Ventilation: Mask ventilation without difficulty LMA: LMA inserted LMA Size: 4.0 Number of attempts: 1 Airway Equipment and Method: Bite block Placement Confirmation: positive ETCO2 Tube secured with: Tape Dental Injury: Teeth and Oropharynx as per pre-operative assessment

## 2020-02-24 NOTE — Anesthesia Postprocedure Evaluation (Signed)
Anesthesia Post Note  Patient: KAYLINN DEDIC  Procedure(s) Performed: HARDWARE REMOVAL LEFT DISTAL RADIUS (Left )     Patient location during evaluation: PACU Anesthesia Type: General Level of consciousness: awake and alert Pain management: pain level controlled Vital Signs Assessment: post-procedure vital signs reviewed and stable Respiratory status: spontaneous breathing, nonlabored ventilation and respiratory function stable Cardiovascular status: blood pressure returned to baseline and stable Postop Assessment: no apparent nausea or vomiting Anesthetic complications: no   No complications documented.  Last Vitals:  Vitals:   02/24/20 1445 02/24/20 1515  BP: 128/65 (!) 150/73  Pulse: 65 69  Resp: 13 16  Temp:  36.8 C  SpO2: 97% 96%    Last Pain:  Vitals:   02/24/20 1515  TempSrc:   PainSc: 2                  Lynda Rainwater

## 2020-02-24 NOTE — H&P (Signed)
Vanessa Harmon is an 69 y.o. female.   Chief Complaint: retained hardware HPI: 69 yo female s/p ORIF left distal radius with retained hardware.  She wishes to proceed with removal of prominent pegs.  Allergies:  Allergies  Allergen Reactions  . Breo Ellipta [Fluticasone Furoate-Vilanterol] Other (See Comments)    Severe hoarseness   . Fosamax [Alendronate Sodium] Other (See Comments)    myalgias  . Levaquin [Levofloxacin] Other (See Comments)    Lightheadedness, not feeling well, ear pain  . Infliximab Other (See Comments)    Skin reaction  . Spiriva Handihaler [Tiotropium Bromide Monohydrate]     UNSPECIFIED REACTION   . Verapamil   . Tape Rash    PAPER TAPE: Causes severe rash and weeping    Past Medical History:  Diagnosis Date  . Adenomatous polyp   . Anxiety    takes Ativan daily as needed  . Arthritis    inflammatory arthritis (Dr. Dagoberto Ligas)  . Asthma   . Chronic back pain    scoliosis   . Constipation    takes miralax every other day  . Depression    takes Lexapro daily  . Dysrhythmia    PSVT  . Eczema   . Essential hypertension, benign    takes Hyzaar and Metoprolol daily  . Family history of adverse reaction to anesthesia    Sister PONV  . Fibromyalgia   . GERD (gastroesophageal reflux disease)   . History of blood transfusion    pt. denies  . History of bronchitis 5 rys ago  . History of colon polyps   . History of shingles   . Hyperlipidemia    takes Atorvastatin daily  . Insomnia    takes Melatonin nightly  . Joint pain   . Osteoarthritis   . Pneumonia as a child  . PSVT (paroxysmal supraventricular tachycardia) ([place])   . Recurrent upper respiratory infection (URI)   . Rheumatoid arthritis ([place])   . Rhinitis, allergic    uses Flonase daily  . Urinary urgency   . Urticaria     Past Surgical History:  Procedure Laterality Date  . ADENOIDECTOMY    . APPENDECTOMY    . BACK SURGERY  2012   at age 68 d/t scoliosis  .  BLEPHAROPLASTY  2018  . BREAST BIOPSY    . cataract surgery    . COLONOSCOPY    . KNEE ARTHROSCOPY Bilateral   . neuroma removed from foot     unsure of which foot  . OPEN REDUCTION INTERNAL FIXATION (ORIF) DISTAL RADIAL FRACTURE Left 08/15/2019   Procedure: OPEN REDUCTION INTERNAL FIXATION (ORIF) DISTAL RADIAL FRACTURE;  Surgeon: Leanora Cover, MD;  Location: [place];  Service: Orthopedics;  Laterality: Left;  block in preop  . POSTERIOR LUMBAR FUSION 4 LEVEL N/A 02/15/2018   Procedure: Decompression and fusion Thoracic nine to Lumbar two with exploration of previous fusion;  Surgeon: Erline Levine, MD;  Location: [place];  Service: Neurosurgery;  Laterality: N/A;  . SHOULDER SURGERY Right   . TONSILLECTOMY    . TOTAL HIP ARTHROPLASTY Right 04/15/2014   Procedure: RIGHT TOTAL HIP ARTHROPLASTY ANTERIOR APPROACH;  Surgeon: Renette Butters, MD;  Location: [place];  Service: Orthopedics;  Laterality: Right;  . TOTAL KNEE ARTHROPLASTY Left 11/26/2019   Procedure: TOTAL KNEE ARTHROPLASTY;  Surgeon: Paralee Cancel, MD;  Location: [facility];  Service: Orthopedics;  Laterality: Left;  70 mins    Family History: Family History  Problem Relation Age  of Onset  . Cancer Mother   . Stroke Mother   . Allergic rhinitis Mother   . Allergic rhinitis Father   . AAA (abdominal aortic aneurysm) Father   . COPD Father   . Hypertension Other        unspecified grandmother  . AAA (abdominal aortic aneurysm) Other        unspecified grandfather  . Cancer Other        unspecified grandfather  . Neuropathy Neg Hx     Social History:   reports that she quit smoking about 23 years ago. She has never used smokeless tobacco. She reports current alcohol use of about 1.0 standard drink of alcohol per week. She reports that she does not use drugs.  Medications: Medications Prior to Admission  Medication Sig Dispense Refill  . aspirin EC 81 MG tablet Take 81 mg by mouth daily. Swallow whole.    .  budesonide (PULMICORT) 0.5 MG/2ML nebulizer solution Take 0.5 mg by nebulization 2 (two) times daily.    . carboxymethylcellulose (REFRESH PLUS) 0.5 % SOLN Place 1 drop into both eyes 3 (three) times daily as needed (dry eyes).     . chlorthalidone (HYGROTON) 25 MG tablet Take 12.5 mg by mouth daily.    Marland Kitchen dextromethorphan-guaiFENesin (MUCINEX DM) 30-600 MG 12hr tablet Take 1 tablet by mouth daily as needed for cough.    . diphenhydrAMINE (BENADRYL) 25 MG tablet Take 25 mg by mouth daily as needed for allergies.    Marland Kitchen escitalopram (LEXAPRO) 20 MG tablet TAKE 1 TABLET BY MOUTH DAILY (Patient taking differently: Take 20 mg by mouth daily. ) 90 tablet 3  . famotidine (PEPCID) 40 MG tablet TAKE 1 TABLET (40 MG TOTAL) BY MOUTH AT BEDTIME. 30 tablet 5  . fluticasone (FLOVENT HFA) 220 MCG/ACT inhaler Inhale 2 puffs into the lungs 2 (two) times daily.    . furosemide (LASIX) 20 MG tablet TAKE 1 TABLET (20 MG TOTAL) BY MOUTH DAILY. (Patient taking differently: Take 10 mg by mouth daily. ) 90 tablet 1  . gabapentin (NEURONTIN) 400 MG capsule Take 400 mg by mouth daily.     Marland Kitchen HYDROcodone-acetaminophen (NORCO) 7.5-325 MG tablet Take 1-2 tablets by mouth every 4 (four) hours as needed for moderate pain. 60 tablet 0  . indomethacin (INDOCIN SR) 75 MG CR capsule Take 75 mg by mouth 2 (two) times daily with a meal.    . irbesartan (AVAPRO) 300 MG tablet Take 300 mg by mouth daily.     Marland Kitchen Lifitegrast (XIIDRA) 5 % SOLN Place 1 drop into both eyes in the morning and at bedtime.     Marland Kitchen loratadine (CLARITIN) 10 MG tablet Take 10 mg by mouth daily.    . montelukast (SINGULAIR) 10 MG tablet Take 1 tablet (10 mg total) by mouth at bedtime. 30 tablet 5  . Multiple Vitamin (MULTIVITAMIN) capsule Take 1 capsule by mouth daily.    . Olopatadine HCl 0.6 % SOLN Place 2 sprays into the nose in the morning and at bedtime.    . Omega-3 Fatty Acids (FISH OIL PO) Take 1,000 mg by mouth daily.    Marland Kitchen omeprazole (PRILOSEC) 40 MG capsule  TAKE ONE CAPSULE BY MOUTH DAILY BEFORE BREAKFAST DAILY (Patient taking differently: Take 40 mg by mouth daily. TAKE ONE CAPSULE BY MOUTH DAILY BEFORE BREAKFAST DAILY) 30 capsule 4  . predniSONE (DELTASONE) 5 MG tablet Take 5 mg by mouth daily with breakfast.     . pseudoephedrine (SUDAFED) 30  MG tablet Take 30 mg by mouth daily as needed for congestion.     . rosuvastatin (CRESTOR) 5 MG tablet Take 5 mg by mouth daily.    . traZODone (DESYREL) 50 MG tablet Take 150 mg by mouth at bedtime.     . valACYclovir (VALTREX) 1000 MG tablet Take 1,000 mg by mouth daily.     . vitamin C (ASCORBIC ACID) 500 MG tablet Take 1,000 mg by mouth daily.     Marland Kitchen abatacept (ORENCIA) 250 MG injection Inject into the vein every 30 (thirty) days.     Marland Kitchen albuterol (PROAIR HFA) 108 (90 Base) MCG/ACT inhaler Inhale two puffs every 4-6 hours if needed for cough or wheeze. (Patient taking differently: Inhale 2 puffs into the lungs every 4 (four) hours as needed for wheezing or shortness of breath. Inhale two puffs every 4-6 hours if needed for cough or wheeze.) 8.5 Inhaler 1  . denosumab (PROLIA) 60 MG/ML SOSY injection Inject 60 mg into the skin every 6 (six) months.    . fluticasone (FLONASE) 50 MCG/ACT nasal spray Use one spray in each nostril twice daily (Patient taking differently: Place 1 spray into both nostrils in the morning and at bedtime. Use one spray in each nostril twice daily) 16 g 5    No results found for this or any previous visit (from the past 48 hour(s)).  No results found.   A comprehensive review of systems was negative.  Blood pressure 127/63, pulse (!) 59, temperature 98.1 F (36.7 C), temperature source Oral, resp. rate 16, height 5\' 5"  (1.651 m), weight 77.3 kg, SpO2 100 %.  General appearance: alert, cooperative and appears stated age Head: Normocephalic, without obvious abnormality, atraumatic Neck: supple, symmetrical, trachea midline Cardio: regular rate and rhythm Resp: clear to  auscultation bilaterally Extremities: Intact sensation and capillary refill all digits.  +epl/fpl/io.  No wounds.  Pulses: 2+ and symmetric Skin: Skin color, texture, turgor normal. No rashes or lesions Neurologic: Grossly normal Incision/Wound: none  Assessment/Plan Retained hardware left wrist. Plan removal of prominent pegs in OR.  Non operative and operative treatment options have been discussed with the patient and patient wishes to proceed with operative treatment. Risks, benefits, and alternatives of surgery have been discussed and the patient agrees with the plan of care.   Leanora Cover 02/24/2020, 12:36 PM

## 2020-02-24 NOTE — Anesthesia Preprocedure Evaluation (Signed)
Anesthesia Evaluation  Patient identified by MRN, date of birth, ID band Patient awake    Reviewed: Allergy & Precautions, NPO status , Patient's Chart, lab work & pertinent test results  Airway Mallampati: I  TM Distance: >3 FB Neck ROM: Full    Dental no notable dental hx.    Pulmonary asthma , former smoker,    Pulmonary exam normal breath sounds clear to auscultation       Cardiovascular hypertension, Pt. on medications Normal cardiovascular exam+ dysrhythmias Atrial Fibrillation  Rhythm:Regular Rate:Normal     Neuro/Psych Anxiety Depression    GI/Hepatic GERD  Medicated and Controlled,  Endo/Other    Renal/GU      Musculoskeletal  (+) Arthritis , Osteoarthritis,  Fibromyalgia -  Abdominal   Peds  Hematology   Anesthesia Other Findings   Reproductive/Obstetrics                             Anesthesia Physical  Anesthesia Plan  ASA: II  Anesthesia Plan: General   Post-op Pain Management:    Induction: Intravenous  PONV Risk Score and Plan: 3 and Ondansetron, Midazolam, Dexamethasone and Treatment may vary due to age or medical condition  Airway Management Planned: LMA  Additional Equipment:   Intra-op Plan:   Post-operative Plan: Extubation in OR  Informed Consent: I have reviewed the patients History and Physical, chart, labs and discussed the procedure including the risks, benefits and alternatives for the proposed anesthesia with the patient or authorized representative who has indicated his/her understanding and acceptance.       Plan Discussed with: Surgeon and CRNA  Anesthesia Plan Comments:         Anesthesia Quick Evaluation

## 2020-02-25 ENCOUNTER — Encounter (HOSPITAL_BASED_OUTPATIENT_CLINIC_OR_DEPARTMENT_OTHER): Payer: Self-pay | Admitting: Orthopedic Surgery

## 2020-03-03 ENCOUNTER — Other Ambulatory Visit: Payer: Self-pay

## 2020-03-03 ENCOUNTER — Encounter (INDEPENDENT_AMBULATORY_CARE_PROVIDER_SITE_OTHER): Payer: Self-pay | Admitting: Ophthalmology

## 2020-03-03 ENCOUNTER — Ambulatory Visit (INDEPENDENT_AMBULATORY_CARE_PROVIDER_SITE_OTHER): Payer: Medicare Other | Admitting: Ophthalmology

## 2020-03-03 DIAGNOSIS — H33311 Horseshoe tear of retina without detachment, right eye: Secondary | ICD-10-CM

## 2020-03-03 DIAGNOSIS — H40003 Preglaucoma, unspecified, bilateral: Secondary | ICD-10-CM

## 2020-03-03 NOTE — Progress Notes (Signed)
03/03/2020     CHIEF COMPLAINT Patient presents for Retina Follow Up   HISTORY OF PRESENT ILLNESS: Vanessa Harmon is a 69 y.o. female who presents to the clinic today for:   HPI    Retina Follow Up    Patient presents with  Other.  In right eye.  This started 1 month ago.  Severity is mild.  Duration of 1 month.  Since onset it is stable.          Comments    1 Month F/U OD  Pt sts she developed new floater OD s/p laser, but sts it has since "settled down." No flashes reported OU. No changes to [place] OU.       Last edited by Rockie Neighbours, [place] on 03/03/2020  2:39 PM. (History)      Referring physician: Juanell Fairly, MD [place],  [employer] [id number]  HISTORICAL INFORMATION:   Selected notes from the MEDICAL RECORD NUMBER    Lab Results  Component Value Date   HGBA1C 5.9 (H) 04/13/2017     CURRENT MEDICATIONS: Current Outpatient Medications (Ophthalmic Drugs)  Medication Sig  . carboxymethylcellulose (REFRESH PLUS) 0.5 % SOLN Place 1 drop into both eyes 3 (three) times daily as needed (dry eyes).   Marland Kitchen Lifitegrast (XIIDRA) 5 % SOLN Place 1 drop into both eyes in the morning and at bedtime.    No current facility-administered medications for this visit. (Ophthalmic Drugs)   Current Outpatient Medications (Other)  Medication Sig  . abatacept (ORENCIA) 250 MG injection Inject into the vein every 30 (thirty) days.   Marland Kitchen albuterol (PROAIR HFA) 108 (90 Base) MCG/ACT inhaler Inhale two puffs every 4-6 hours if needed for cough or wheeze. (Patient taking differently: Inhale 2 puffs into the lungs every 4 (four) hours as needed for wheezing or shortness of breath. Inhale two puffs every 4-6 hours if needed for cough or wheeze.)  . aspirin EC 81 MG tablet Take 81 mg by mouth daily. Swallow whole.  . budesonide (PULMICORT) 0.5 MG/2ML nebulizer solution Take 0.5 mg by nebulization 2 (two) times daily.  . chlorthalidone (HYGROTON) 25 MG tablet  Take 12.5 mg by mouth daily.  Marland Kitchen denosumab (PROLIA) 60 MG/ML SOSY injection Inject 60 mg into the skin every 6 (six) months.  . dextromethorphan-guaiFENesin (MUCINEX DM) 30-600 MG 12hr tablet Take 1 tablet by mouth daily as needed for cough.  . diphenhydrAMINE (BENADRYL) 25 MG tablet Take 25 mg by mouth daily as needed for allergies.  Marland Kitchen escitalopram (LEXAPRO) 20 MG tablet TAKE 1 TABLET BY MOUTH DAILY (Patient taking differently: Take 20 mg by mouth daily. )  . famotidine (PEPCID) 40 MG tablet TAKE 1 TABLET (40 MG TOTAL) BY MOUTH AT BEDTIME.  . fluticasone (FLONASE) 50 MCG/ACT nasal spray Use one spray in each nostril twice daily (Patient taking differently: Place 1 spray into both nostrils in the morning and at bedtime. Use one spray in each nostril twice daily)  . fluticasone (FLOVENT HFA) 220 MCG/ACT inhaler Inhale 2 puffs into the lungs 2 (two) times daily.  . furosemide (LASIX) 20 MG tablet TAKE 1 TABLET (20 MG TOTAL) BY MOUTH DAILY. (Patient taking differently: Take 10 mg by mouth daily. )  . gabapentin (NEURONTIN) 400 MG capsule Take 400 mg by mouth daily.   Marland Kitchen HYDROcodone-acetaminophen (NORCO) 5-325 MG tablet 1-2 tabs po q6 hours prn pain  . indomethacin (INDOCIN SR) 75 MG CR capsule Take 75 mg by  mouth 2 (two) times daily with a meal.  . irbesartan (AVAPRO) 300 MG tablet Take 300 mg by mouth daily.   Marland Kitchen loratadine (CLARITIN) 10 MG tablet Take 10 mg by mouth daily.  . montelukast (SINGULAIR) 10 MG tablet Take 1 tablet (10 mg total) by mouth at bedtime.  . Multiple Vitamin (MULTIVITAMIN) capsule Take 1 capsule by mouth daily.  . Olopatadine HCl 0.6 % SOLN Place 2 sprays into the nose in the morning and at bedtime.  . Omega-3 Fatty Acids (FISH OIL PO) Take 1,000 mg by mouth daily.  Marland Kitchen omeprazole (PRILOSEC) 40 MG capsule TAKE ONE CAPSULE BY MOUTH DAILY BEFORE BREAKFAST DAILY (Patient taking differently: Take 40 mg by mouth daily. TAKE ONE CAPSULE BY MOUTH DAILY BEFORE BREAKFAST DAILY)  .  predniSONE (DELTASONE) 5 MG tablet Take 5 mg by mouth daily with breakfast.   . pseudoephedrine (SUDAFED) 30 MG tablet Take 30 mg by mouth daily as needed for congestion.   . rosuvastatin (CRESTOR) 5 MG tablet Take 5 mg by mouth daily.  . traZODone (DESYREL) 50 MG tablet Take 150 mg by mouth at bedtime.   . valACYclovir (VALTREX) 1000 MG tablet Take 1,000 mg by mouth daily.   . vitamin C (ASCORBIC ACID) 500 MG tablet Take 1,000 mg by mouth daily.    No current facility-administered medications for this visit. (Other)      REVIEW OF SYSTEMS:    ALLERGIES Allergies  Allergen Reactions  . Breo Ellipta [Fluticasone Furoate-Vilanterol] Other (See Comments)    Severe hoarseness   . Fosamax [Alendronate Sodium] Other (See Comments)    myalgias  . Levaquin [Levofloxacin] Other (See Comments)    Lightheadedness, not feeling well, ear pain  . Infliximab Other (See Comments)    Skin reaction  . Spiriva Handihaler [Tiotropium Bromide Monohydrate]     UNSPECIFIED REACTION   . Verapamil   . Tape Rash    PAPER TAPE: Causes severe rash and weeping    PAST MEDICAL HISTORY Past Medical History:  Diagnosis Date  . Adenomatous polyp   . Anxiety    takes Ativan daily as needed  . Arthritis    inflammatory arthritis (Dr. Dagoberto Ligas)  . Asthma   . Chronic back pain    scoliosis   . Constipation    takes miralax every other day  . Depression    takes Lexapro daily  . Dysrhythmia    PSVT  . Eczema   . Essential hypertension, benign    takes Hyzaar and Metoprolol daily  . Family history of adverse reaction to anesthesia    Sister PONV  . Fibromyalgia   . GERD (gastroesophageal reflux disease)   . History of blood transfusion    pt. denies  . History of bronchitis 5 rys ago  . History of colon polyps   . History of shingles   . Hyperlipidemia    takes Atorvastatin daily  . Insomnia    takes Melatonin nightly  . Joint pain   . Osteoarthritis   . Pneumonia as a child    . PSVT (paroxysmal supraventricular tachycardia) ([place])   . Recurrent upper respiratory infection (URI)   . Rheumatoid arthritis ([place])   . Rhinitis, allergic    uses Flonase daily  . Urinary urgency   . Urticaria    Past Surgical History:  Procedure Laterality Date  . ADENOIDECTOMY    . APPENDECTOMY    . BACK SURGERY  2012   at age 26 d/t scoliosis  .  BLEPHAROPLASTY  2018  . BREAST BIOPSY    . cataract surgery    . COLONOSCOPY    . HARDWARE REMOVAL Left 02/24/2020   Procedure: HARDWARE REMOVAL LEFT DISTAL RADIUS;  Surgeon: Leanora Cover, MD;  Location: [facility];  Service: Orthopedics;  Laterality: Left;  . KNEE ARTHROSCOPY Bilateral   . neuroma removed from foot     unsure of which foot  . OPEN REDUCTION INTERNAL FIXATION (ORIF) DISTAL RADIAL FRACTURE Left 08/15/2019   Procedure: OPEN REDUCTION INTERNAL FIXATION (ORIF) DISTAL RADIAL FRACTURE;  Surgeon: Leanora Cover, MD;  Location: [facility];  Service: Orthopedics;  Laterality: Left;  block in preop  . POSTERIOR LUMBAR FUSION 4 LEVEL N/A 02/15/2018   Procedure: Decompression and fusion Thoracic nine to Lumbar two with exploration of previous fusion;  Surgeon: Erline Levine, MD;  Location: [facility];  Service: Neurosurgery;  Laterality: N/A;  . SHOULDER SURGERY Right   . TONSILLECTOMY    . TOTAL HIP ARTHROPLASTY Right 04/15/2014   Procedure: RIGHT TOTAL HIP ARTHROPLASTY ANTERIOR APPROACH;  Surgeon: Renette Butters, MD;  Location: [facility];  Service: Orthopedics;  Laterality: Right;  . TOTAL KNEE ARTHROPLASTY Left 11/26/2019   Procedure: TOTAL KNEE ARTHROPLASTY;  Surgeon: Paralee Cancel, MD;  Location: [facility];  Service: Orthopedics;  Laterality: Left;  70 mins    FAMILY HISTORY Family History  Problem Relation Age of Onset  . Cancer Mother   . Stroke Mother   . Allergic rhinitis Mother   . Allergic rhinitis Father   . AAA (abdominal aortic aneurysm) Father   . COPD Father   . Hypertension Other         unspecified grandmother  . AAA (abdominal aortic aneurysm) Other        unspecified grandfather  . Cancer Other        unspecified grandfather  . Neuropathy Neg Hx     SOCIAL HISTORY Social History   Tobacco Use  . Smoking status: Former Smoker    Quit date: 1998    Years since quitting: 23.8  . Smokeless tobacco: Never Used  . Tobacco comment: hasn't smoked in 53yrs  Vaping Use  . Vaping Use: Never used  Substance Use Topics  . Alcohol use: Yes    Alcohol/week: 1.0 standard drink    Types: 1 Glasses of wine per week    Comment: rarely with dinner  . Drug use: No         OPHTHALMIC EXAM: Base Eye Exam    Visual Acuity (ETDRS)      Right Left   Dist cc 20/30 +2 20/25 -2   Dist ph cc NI    Correction: Glasses       Tonometry (Tonopen, 2:40 PM)      Right Left   Pressure 13 13       Pupils      Pupils Dark Light Shape React APD   Right PERRL 4 3 Round Brisk None   Left PERRL 4 3 Round Brisk None       Visual Fields (Counting fingers)      Left Right    Full Full       Extraocular Movement      Right Left    Full Full       Neuro/Psych    Oriented x3: Yes   Mood/Affect: Normal       Dilation    Right eye: 1.0% Mydriacyl, 2.5% Phenylephrine @ 2:43 PM  Slit Lamp and Fundus Exam    External Exam      Right Left   External Normal Normal       Slit Lamp Exam      Right Left   Lids/Lashes Normal Normal   Conjunctiva/Sclera White and quiet White and quiet   Cornea Clear Clear   Anterior Chamber Deep and quiet Deep and quiet   Iris Round and reactive Round and reactive   Lens Centered posterior chamber intraocular lens Centered posterior chamber intraocular lens   Anterior Vitreous Clear Normal       Fundus Exam      Right Left   Posterior Vitreous Posterior vitreous detachment Posterior vitreous detachment   Disc Normal Normal   C/D Ratio 0.6 0.75   Macula Normal Normal   Vessels Normal Normal   Periphery No new holes or tears  good retinopexy inferotemporal No new holes or tears good retinopexy inferotemporal          IMAGING AND PROCEDURES  Imaging and Procedures for 03/03/20  Color Fundus Photography Optos - OU - Both Eyes       Right Eye Progression has been stable. Disc findings include increased cup to disc ratio. Macula : normal observations. Vessels : normal observations. Periphery : tear.   Left Eye Progression has been stable. Disc findings include increased cup to disc ratio. Macula : normal observations. Vessels : normal observations. Periphery : normal observations.   Notes Good retinopexy inferotemporal OD  Floater inferiorly OS no retinal breaks                ASSESSMENT/PLAN:  Glaucoma suspect, both eyes Enlarged cup-to-disc OU yet with good acuity, glaucoma suspect follow-up with Dr. Nathanial Rancher as scheduled  [place] retinal tear of right eye No new retinal breaks right eye, status post laser retinopexy      ICD-10-CM   1. [place] retinal tear of right eye  H33.311 Color Fundus Photography Optos - OU - Both Eyes  2. Glaucoma suspect, both eyes  H40.003     1.  Follow-up Dr. Nathanial Rancher as scheduled, please note enlarged cup-to-disc ratio in each eye with normal intraocular pressure raise the possibility of low-tension or normal tension glaucoma for which Dr. Carolynn Sayers is in great position to monitor and assess  2.  3.  Ophthalmic Meds Ordered this visit:  No orders of the defined types were placed in this encounter.      Return in about 6 months (around 08/31/2020) for DILATE OU, COLOR FP.  There are no Patient Instructions on file for this visit.   Explained the diagnoses, plan, and follow up with the patient and they expressed understanding.  Patient expressed understanding of the importance of proper follow up care.   Clent Demark Trianna Lupien M.D. Diseases & Surgery of the Retina and Vitreous [facility] [place] 03/03/20     Abbreviations: M  myopia (nearsighted); A astigmatism; H hyperopia (farsighted); P presbyopia; Mrx spectacle prescription;  CTL contact lenses; OD right eye; OS left eye; OU both eyes  XT exotropia; ET esotropia; PEK punctate epithelial keratitis; PEE punctate epithelial erosions; DES dry eye syndrome; MGD meibomian gland dysfunction; ATs artificial tears; PFAT's preservative free artificial tears; Roberts nuclear sclerotic cataract; PSC posterior subcapsular cataract; ERM epi-retinal membrane; PVD posterior vitreous detachment; RD retinal detachment; DM diabetes mellitus; DR diabetic retinopathy; NPDR non-proliferative diabetic retinopathy; PDR proliferative diabetic retinopathy; CSME clinically significant macular edema; DME diabetic macular edema; dbh dot blot hemorrhages; CWS  cotton wool spot; POAG primary open angle glaucoma; C/D cup-to-disc ratio; HVF humphrey visual field; GVF goldmann visual field; OCT optical coherence tomography; IOP intraocular pressure; BRVO Branch retinal vein occlusion; CRVO central retinal vein occlusion; CRAO central retinal artery occlusion; BRAO branch retinal artery occlusion; RT retinal tear; SB scleral buckle; PPV pars plana vitrectomy; VH Vitreous hemorrhage; PRP panretinal laser photocoagulation; IVK intravitreal kenalog; VMT vitreomacular traction; MH Macular hole;  NVD neovascularization of the disc; NVE neovascularization elsewhere; AREDS age related eye disease study; ARMD age related macular degeneration; POAG primary open angle glaucoma; EBMD epithelial/anterior basement membrane dystrophy; ACIOL anterior chamber intraocular lens; IOL intraocular lens; PCIOL posterior chamber intraocular lens; Phaco/IOL phacoemulsification with intraocular lens placement; [place] photorefractive keratectomy; LASIK laser assisted in situ keratomileusis; HTN hypertension; DM diabetes mellitus; COPD chronic obstructive pulmonary disease

## 2020-03-03 NOTE — Assessment & Plan Note (Signed)
Enlarged cup-to-disc OU yet with good acuity, glaucoma suspect follow-up with Dr. Nathanial Rancher as scheduled

## 2020-03-03 NOTE — Assessment & Plan Note (Signed)
No new retinal breaks right eye, status post laser retinopexy

## 2020-03-30 DIAGNOSIS — M898X9 Other specified disorders of bone, unspecified site: Secondary | ICD-10-CM | POA: Insufficient documentation

## 2020-04-22 ENCOUNTER — Other Ambulatory Visit: Payer: Self-pay | Admitting: Neurosurgery

## 2020-05-21 SURGERY — Surgical Case
Anesthesia: *Unknown

## 2020-05-26 NOTE — Progress Notes (Signed)
Surgical Instructions   Your procedure is scheduled on Friday, February 11th.  Report to [facility] Main Entrance "A" at 5:30 A.M., then check in with the Admitting office.  Call this number if you have problems the morning of surgery:  [phone]   If you have any questions prior to your surgery date call [phone]: Open Monday-Friday 8am-4pm   Remember:  Do not eat or drink after midnight the night before your surgery   Take these medicines the morning of surgery with A SIP OF WATER  azelastine (ASTELIN)/nasal spray escitalopram (LEXAPRO) fluticasone (FLOVENT HFA)/inhaler loratadine (CLARITIN)  omeprazole (PRILOSEC)  predniSONE (DELTASONE)  valACYclovir (VALTREX)   If needed: Eye drops, HYDROcodone-acetaminophen (NORCO), ipratropium (ATROVENT)/nasal spray,    albuterol (PROAIR HFA)/inhaler -bring with you the day of surgery  Follow your surgeon's instructions on when to stop Aspirin.  If no instructions were given by your surgeon then you will need to call the office to get those instructions.    As of today, STOP taking indomethacin (INDOCIN SR), Aleve, Naproxen, Ibuprofen, Motrin, Advil, Goody's, BC's, all herbal medications, fish oil, and all vitamins.                     Do not wear jewelry, make up, or nail polish            Do not wear lotions, powders, perfumes, or deodorant.            Do not shave 48 hours prior to surgery.              Do not bring valuables to the hospital.            [facility] is not responsible for any belongings or valuables.  Do NOT Smoke (Tobacco/Vaping) or drink Alcohol 24 hours prior to your procedure If you use a CPAP at night, you may bring all equipment for your overnight stay.   Contacts, glasses, dentures or bridgework may not be worn into surgery, please bring cases for these belongings   For patients admitted to the hospital, discharge time will be determined by your treatment team.   Patients discharged the day of surgery  will not be allowed to drive home, and someone needs to stay with them for 24 hours.  Special instructions:   [facility]- Preparing For Surgery  Before surgery, you can play an important role. Because skin is not sterile, your skin needs to be as free of germs as possible. You can reduce the number of germs on your skin by washing with CHG (chlorahexidine gluconate) Soap before surgery.  CHG is an antiseptic cleaner which kills germs and bonds with the skin to continue killing germs even after washing.    Oral Hygiene is also important to reduce your risk of infection.  Remember - BRUSH YOUR TEETH THE MORNING OF SURGERY WITH YOUR REGULAR TOOTHPASTE  Please do not use if you have an allergy to CHG or antibacterial soaps. If your skin becomes reddened/irritated stop using the CHG.  Do not shave (including legs and underarms) for at least 48 hours prior to first CHG shower. It is OK to shave your face.  Please follow these instructions carefully.   1. Shower the NIGHT BEFORE SURGERY and the MORNING OF SURGERY  2. If you chose to wash your hair, wash your hair first as usual with your normal shampoo.  3. After you shampoo, rinse your hair and body thoroughly to remove the shampoo.  4. Wash  Face and genitals (private parts) with your normal soap.   5.  Shower the NIGHT BEFORE SURGERY and the MORNING OF SURGERY with CHG Soap.   6. Use CHG Soap as you would any other liquid soap. You can apply CHG directly to the skin and wash gently with a scrungie or a clean washcloth.   7. Apply the CHG Soap to your body ONLY FROM THE NECK DOWN.  Do not use on open wounds or open sores. Avoid contact with your eyes, ears, mouth and genitals (private parts). Wash Face and genitals (private parts)  with your normal soap.   8. Wash thoroughly, paying special attention to the area where your surgery will be performed.  9. Thoroughly rinse your body with warm water from the neck down.  10. DO NOT shower/wash  with your normal soap after using and rinsing off the CHG Soap.  11. Pat yourself dry with a CLEAN TOWEL.  12. Wear CLEAN PAJAMAS to bed the night before surgery  13. Place CLEAN SHEETS on your bed the night before your surgery  14. DO NOT SLEEP WITH PETS.  Day of Surgery: Shower with CHG soap Wear Clean/Comfortable clothing the morning of surgery Do not apply any deodorants/lotions.   Remember to brush your teeth WITH YOUR REGULAR TOOTHPASTE.   Please read over the following fact sheets that you were given.

## 2020-05-26 NOTE — H&P (Signed)
Patient ID:   [id number] Patient: Vanessa Harmon  Date of Birth: [birth year] Visit Type: Office Visit   Date: 03/09/2020 03:15 PM Provider: Marchia Meiers. Vertell Limber MD   This 70 year old female presents for back pain.  HISTORY OF PRESENT ILLNESS: 1.  back pain  Patient returns to review her thoracic and lumbar CT scans.  T-spine, L-spine, and pelvis CT studies uploaded to canopy.  The patient's imaging reveals significant bony exostosis and growth within the right-sided neural foramen involving the T6-7 and T7-8 level along with loosening of T8 screws.  This dystrophic calcification is causing severe foraminal stenosis on the right and I believe is the basis for her severe right-sided chest wall pain.  I have recommended that this be treated surgically with removal of the bony overgrowth and decompression of neural elements.  I have asked for an MRI to be obtained of her thoracic spine before doing so along with scoliosis AP and lateral x-rays.  The remaining hardware appears to be satisfactory in terms of loosening, but there is a medial breach of the pedicle wall on the right L1 and both T12 screws.  This has not changed from her prior exam      Medical/Surgical/Interim History Reviewed, no change.  Last detailed document date:07/31/2013.     PAST MEDICAL HISTORY, SURGICAL HISTORY, FAMILY HISTORY, SOCIAL HISTORY AND REVIEW OF SYSTEMS I have reviewed the patient's past medical, surgical, family and social history as well as the comprehensive review of systems as included on the [facility] history form dated 01/10/2019, which I have signed.  Family History: Reviewed, no changes.  Last detailed document date:07/31/2013.   Social History: Reviewed, no changes. Last detailed document date: 07/31/2013.    MEDICATIONS: (added, continued or stopped this visit) Started Medication Directions Instruction Stopped  aspirin 81 mg tablet,delayed release take 1  tablet by oral route  every day    ASTELIN NASAL SPRAY  NASAL     atorvastatin 80 mg tablet take 1 tablet by oral route  every day    chlorthalidone 25 mg tablet take 1 tablet by oral route  every day    Fish Oil take 1 tablet by mouth twice daily    FLUTICASONE PROPIONATE inhale 2 spray by intranasal route 2 times every day in each nostril   01/27/2020 gabapentin 300 mg capsule take 1 capsule by oral route qHS   02/25/2020 hydrocodone 5 mg-acetaminophen 325 mg tablet take 1 tablet by oral route  every 8 hours as needed for pain    indomethacin ER 75 mg capsule,extended release take 1 capsule by oral route  every day    irbesartan 300 mg tablet take 1 tablet by oral route  every day    Lasix 20 mg tablet take 1 tablet by oral route  every day    leflunomide 10 mg tablet take 1 tablet by oral route  every day    Lexapro 20 mg tablet take 1 tablet by oral route  every day   01/14/2019 Medrol (Pak) 4 mg tablets in a dose pack Take as directed    Mucinex DM     multivitamin tablet 1 tablet daily   03/06/2019 Neurontin 300 mg capsule take 1 capsule by oral route 3 times every day    omeprazole 40 mg capsule,delayed release take 1 capsule by oral route  every day before a meal    prednisone 5 mg tablet take 1.5 tablet by oral route  every day  ProAir HFA 90 mcg/actuation aerosol inhaler inhale 2 puff by inhalation route  every 4 - 6 hours as needed    ranitidine 300 mg tablet take 1 tablet by oral route  every day at bedtime    Remicade infuse by intravenous route  every 8 weeks over no less than    Singulair 10 mg tablet take 1 tablet by oral route  every day in the evening    trazodone 100 mg tablet take 1 tablet by oral route 2 times every day after meals    valacyclovir 1 gram tablet take 1 tablet by oral route  every day    Vitamin C ER 1,000 mg tablet,extended release take 1 tablet by mouth  daily    Vitamin D3     Zyrtec       ALLERGIES: Ingredient Reaction Medication Name Comment LEVOFLOXACIN  Levaquin  VERAPAMIL    DOXYCYCLINE MONOHYDRATE  Vibramycin Rash DOXYCYCLINE HYCLATE  Vibramycin Rash DOXYCYCLINE CALCIUM  Vibramycin Rash TAPE, PERMEABLE ADHESIVE   Paper tape VILANTEROL TRIFENATATE  Breo Ellipta  FLUTICASONE FUROATE  Breo Ellipta   Reviewed, no changes.    PHYSICAL EXAM:  Vitals Date Temp F BP Pulse Ht In Wt Lb BMI BSA Pain Score 03/09/2020    67 171 26.78  5/10     IMPRESSION:  Dystrophic calcification causing right-sided foraminal narrowing T6-7 and T7-8 levels with loosening of bilateral T8 screws.  PLAN: Patient is going to return with thoracic MRI and also scoliosis x-rays.  I will make further recommendations after these studies have been obtained  Orders: Diagnostic Procedures: Assessment Procedure M41.20 Scoliosis- AP/Lat M41.35 MRI Spine/thor W/o Contrast M41.35 Thoraco-lumbar Spine- AP/Lat/Flex/Ex  Completed Orders (this encounter) Order Details Reason Side Interpretation Result Initial Treatment Date Region Thoraco-lumbar Spine- AP/Lat/Flex/Ex T/L 4 VIEWS     03/09/2020 T6 to L5  Assessment/Plan  # Detail Type Description  1. Assessment Back pain, unspecified back location, unspecified back pain laterality, unspecified chronicity (M54.9).     2. Assessment Loosening of hardware in spine (T84.498A).     3. Assessment Scoliosis (and kyphoscoliosis), idiopathic (M41.20).     4. Assessment Radiculopathy of thoracic region (M54.14).     5. Assessment Thoracogenic scoliosis of thoracolumbar region (M41.35).                   Provider:  Marchia Meiers. Vertell Limber MD  03/15/2020 08:08 AM    Dictation edited by: Marchia Meiers. Vertell Limber    CC Providers: Juanell Fairly [place],  [place]  [phone]   [facility]  [place], [place] [address]-               Electronically signed by Marchia Meiers. Vertell Limber MD on 03/15/2020 08:09 AM Patient ID:   [id number] Patient: Vanessa Harmon  Date of Birth: 1950/08/03 Visit Type: Office Visit   Date: 01/27/2020 02:00 PM Provider: Marchia Meiers. Vertell Limber MD   This 70 year old female presents for back pain.  HISTORY OF PRESENT ILLNESS: 1.  back pain  Patient returns as scheduled, unfortunately depressed regarding her constant back pain.  While she had demonstrated much improvement at her April appointment, back pain has increased causing difficulty with gait and posture.  She is now out of the sling, but notes poor healing in the left wrist.  Bone stimulator has proven beneficial.  She also underwent left total knee replacement 11/26/2019, and has healed well from this.  Her left leg  remains longer than her right, which continues to cause discomfort with standing and walking.  She states her 7 week RV trip was "miserable and depressing" due to her pain with ambulation.  Following her April visit with Dr. Vertell Limber, she spoke with her rheumatologist who recommended neurosurgical follow-up.  She was also seen by her primary care physician who suggesting change of NSAIDs.  Indocin does offer some relief.  She stopped her gabapentin abruptly because she was not feeling well in general.  Unfortunately pain symptoms returned soon thereafter.  She is now taking gabapentin 400 mg q.h.s.  Norco 5/325 1/2 tab is taken only p.r.n. (postop left TKR rx)  The patient states that her pain goes away when she lays down.  She says her rheumatologist was not able to help her with her arthritic pain.  She used Indocin and says this helped her in the past but did not get any relief with Celebrex  Her hip flexor weakness has improved      Medical/Surgical/Interim History Reviewed, no change.  Last detailed document date:07/31/2013.     PAST MEDICAL HISTORY, SURGICAL  HISTORY, FAMILY HISTORY, SOCIAL HISTORY AND REVIEW OF SYSTEMS I have reviewed the patient's past medical, surgical, family and social history as well as the comprehensive review of systems as included on the [facility] history form dated 01/10/2019, which I have signed.  Family History: Reviewed, no changes.  Last detailed document date:07/31/2013.   Social History: Reviewed, no changes. Last detailed document date: 07/31/2013.    MEDICATIONS: (added, continued or stopped this visit) Started Medication Directions Instruction Stopped  aspirin 81 mg tablet,delayed release take 1 tablet by oral route  every day    ASTELIN NASAL SPRAY  NASAL     atorvastatin 80 mg tablet take 1 tablet by oral route  every day    chlorthalidone 25 mg tablet take 1 tablet by oral route  every day    Fish Oil take 1 tablet by mouth twice daily    FLUTICASONE PROPIONATE inhale 2 spray by intranasal route 2 times every day in each nostril   01/27/2020 gabapentin 300 mg capsule take 1 capsule by oral route qHS   03/22/2019 hydrocodone 5 mg-acetaminophen 325 mg tablet take 1 tablet by oral route  every 8 hours as needed for pain    indomethacin ER 75 mg capsule,extended release take 1 capsule by oral route  every day    irbesartan 300 mg tablet take 1 tablet by oral route  every day    Lasix 20 mg tablet take 1 tablet by oral route  every day    leflunomide 10 mg tablet take 1 tablet by oral route  every day    Lexapro 20 mg tablet take 1 tablet by oral route  every day   01/14/2019 Medrol (Pak) 4 mg tablets in a dose pack Take as directed    Mucinex DM     multivitamin tablet 1 tablet daily   03/06/2019 Neurontin 300 mg capsule take 1 capsule by oral route 3 times every day   12/06/2019 Neurontin 400 mg capsule take 1 capsule by oral route 3 times every day  01/27/2020  omeprazole 40 mg capsule,delayed  release take 1 capsule by oral route  every day before a meal    prednisone 5 mg tablet take 1.5 tablet by oral route  every day    ProAir HFA 90 mcg/actuation aerosol inhaler inhale 2 puff by inhalation route  every 4 -  6 hours as needed    ranitidine 300 mg tablet take 1 tablet by oral route  every day at bedtime    Remicade infuse by intravenous route  every 8 weeks over no less than    Singulair 10 mg tablet take 1 tablet by oral route  every day in the evening    trazodone 100 mg tablet take 1 tablet by oral route 2 times every day after meals    valacyclovir 1 gram tablet take 1 tablet by oral route  every day    Vitamin C ER 1,000 mg tablet,extended release take 1 tablet by mouth daily    Vitamin D3     Zyrtec       ALLERGIES: Ingredient Reaction Medication Name Comment LEVOFLOXACIN  Levaquin  VERAPAMIL    DOXYCYCLINE MONOHYDRATE  Vibramycin Rash DOXYCYCLINE HYCLATE  Vibramycin Rash DOXYCYCLINE CALCIUM  Vibramycin Rash TAPE, PERMEABLE ADHESIVE   Paper tape VILANTEROL TRIFENATATE  Breo Ellipta  FLUTICASONE FUROATE  Breo Ellipta   Reviewed, no changes.    PHYSICAL EXAM:  Vitals Date Temp F BP Pulse Ht In Wt Lb BMI BSA Pain Score 01/27/2020  168/85 69 67 170 26.63  5/10     IMPRESSION:  The patient is complaining of severe persistent back pain refractory to medications and says it is steadily getting worse  PLAN: I have recommended obtaining CT scans of the thoracic, lumbar, sacral spine to assess hardware positioning and for lucency suggestive of pseudoarthrosis as a cause of her back pain.  She will come back to see me after the studies have been performed  Orders: Diagnostic Procedures: Assessment Procedure T84.498A CT L-Spine W/o Contrast T84.498A CT Sacrum W/o Contrast T84.498A CT T-Spine W/o Contrast Instruction(s)/Education: Assessment Instruction I10 Lifestyle  education [phone] Dietary management education, guidance, and counseling  Completed Orders (this encounter) Order Details Reason Side Interpretation Result Initial Treatment Date Region Lifestyle education Patient will follow up with Primary Care Physician.       Dietary management education, guidance, and counseling Encouraged patient to eat well balanced diet.        Assessment/Plan  # Detail Type Description  1. Assessment Scoliosis (and kyphoscoliosis), idiopathic (M41.20).     2. Assessment Loosening of hardware in spine (T84.498A).     3. Assessment Back pain, unspecified back location, unspecified back pain laterality, unspecified chronicity (M54.9).     4. Assessment Essential (primary) hypertension (I10).     5. Assessment Body mass index (BMI) 26.0-26.9, adult (W80.32).  Plan Orders Today's instructions / counseling include(s) Dietary management education, guidance, and counseling. Clinical information/comments: Encouraged patient to eat well balanced diet.       Pain Management Plan Pain Scale: 5/10. Method: Numeric Pain Intensity Scale. Location: back. Onset: 01/30/2013. Duration: varies. Quality: discomforting. Pain management follow-up plan of care: Patient will continue medication management.Marland Kitchen     MEDICATIONS PRESCRIBED TODAY    Rx Quantity Refills GABAPENTIN 300 mg  30 2           Provider:  Marchia Meiers. Vertell Limber MD  02/01/2020 12:48 PM    Dictation edited by: Marchia Meiers. Vertell Limber    CC Providers: Juanell Fairly [place],  [place]  [phone]   [facility]  [place], [place] [address]               Electronically signed by Marchia Meiers. Vertell Limber MD on 02/01/2020 12:48 PM

## 2020-05-27 ENCOUNTER — Other Ambulatory Visit: Payer: Self-pay

## 2020-05-27 ENCOUNTER — Other Ambulatory Visit (HOSPITAL_COMMUNITY)
Admission: RE | Admit: 2020-05-27 | Discharge: 2020-05-27 | Disposition: A | Discharge status: Home / Self Care | Payer: Medicare Other | Source: Ambulatory Visit | Attending: Neurosurgery | Admitting: Neurosurgery

## 2020-05-27 ENCOUNTER — Encounter (HOSPITAL_COMMUNITY)
Admission: RE | Admit: 2020-05-27 | Discharge: 2020-05-27 | Disposition: A | Discharge status: Home / Self Care | Payer: Medicare Other | Source: Ambulatory Visit | Attending: Neurosurgery | Admitting: Neurosurgery

## 2020-05-27 ENCOUNTER — Encounter (HOSPITAL_COMMUNITY): Payer: Self-pay

## 2020-05-27 DIAGNOSIS — Z01812 Encounter for preprocedural laboratory examination: Secondary | ICD-10-CM | POA: Insufficient documentation

## 2020-05-27 DIAGNOSIS — Z20822 Contact with and (suspected) exposure to covid-19: Secondary | ICD-10-CM | POA: Insufficient documentation

## 2020-05-27 LAB — SARS CORONAVIRUS 2 (TAT 6-24 HRS): SARS Coronavirus 2: NEGATIVE

## 2020-05-27 LAB — TYPE AND SCREEN
ABO/RH(D): O POS
Antibody Screen: NEGATIVE

## 2020-05-27 LAB — CBC
HCT: 40.6 % (ref 36.0–46.0)
Hemoglobin: 13.4 g/dL (ref 12.0–15.0)
MCH: 32.9 pg (ref 26.0–34.0)
MCHC: 33 g/dL (ref 30.0–36.0)
MCV: 99.8 fL (ref 80.0–100.0)
Platelets: 350 10*3/uL (ref 150–400)
RBC: 4.07 MIL/uL (ref 3.87–5.11)
RDW: 13.4 % (ref 11.5–15.5)
WBC: 12.7 10*3/uL — ABNORMAL HIGH (ref 4.0–10.5)
nRBC: 0 % (ref 0.0–0.2)

## 2020-05-27 LAB — BASIC METABOLIC PANEL
Anion gap: 10 (ref 5–15)
BUN: 17 mg/dL (ref 8–23)
CO2: 27 mmol/L (ref 22–32)
Calcium: 9.3 mg/dL (ref 8.9–10.3)
Chloride: 91 mmol/L — ABNORMAL LOW (ref 98–111)
Creatinine, Ser: 0.96 mg/dL (ref 0.44–1.00)
GFR, Estimated: >60 mL/min (ref >60)
Glucose, Bld: 117 mg/dL — ABNORMAL HIGH (ref 70–99)
Potassium: 4.3 mmol/L (ref 3.5–5.1)
Sodium: 128 mmol/L — ABNORMAL LOW (ref 135–145)

## 2020-05-27 LAB — SURGICAL PCR SCREEN
MRSA, PCR: NEGATIVE
Staphylococcus aureus: NEGATIVE

## 2020-05-27 NOTE — Progress Notes (Signed)
PCP - Dr. Juanell Fairly Cardiologist - Dr. Jacques Earthly ([place]- records requested)  PPM/ICD - denies  Chest x-ray - N/A EKG - 08/12/2019 Stress Test - per patient roughly about five years ago  ECHO - 2014 Cardiac Cath -   Sleep Study - denies  CPAP - N/A  DM: denies  Blood Thinner Instructions: N/A Aspirin Instructions: N/A  ERAS Protcol - No  COVID TEST- Scheduled for today 05/27/2020  Anesthesia review: YES, cardiac hx, records requested  Patient denies shortness of breath, fever, cough and chest pain at PAT appointment  All instructions explained to the patient, with a verbal understanding of the material. Patient agrees to go over the instructions while at home for a better understanding. Patient also instructed to self quarantine after being tested for COVID-19. The opportunity to ask questions was provided.

## 2020-05-27 NOTE — Progress Notes (Signed)
NA: 128, abnormal lab relayed to [employer] of Dr. Donald Pore office via telephone

## 2020-05-28 NOTE — Progress Notes (Signed)
Anesthesia Chart Review:  Hx of two episodes of SVT ~ age 70 that required treatment with adenosine. Was referred to cardiology (Dr. Danny Lawless) in 2019 for evaluation of dyspnea in the setting of significant coronary calcifications on chest CT. She ultimately underwent stress, echo, and Zio patch that showed non-ischemia, normal EF, with event monitor showing short runs of SVT/atrial tachycardia. She was minimally symptomatic of her palpitations and had not tolerated b-blockers or CCBs in the past. It was recommended to consider ablation or antiarrhythmic meds if she experienced worsening in the future.   Preop labs reviewed, sodium low at 128, review of history shows chronic hyponatremia. Remainder of labs unremarkable.  Sodium (mmol/L)  Date Value  05/27/2020 128 (L)  02/20/2020 128 (L)  11/27/2019 129 (L)    EKG 08/12/19: Sinus bradycardia. Rate 57.   7 day Cardiac Monitor 08/13/17 ([place]): Result Impression: The patient was monitored for 7 days and 21 hours Patient had a min HR of 49 bpm, max HR of 193 bpm, and avg HR of 66 bpm. Predominant underlying rhythm was Sinus Rhythm.  155 Supraventricular Tachycardia runs occurred, the run with the fastest interval lasting 7 beats with a max rate of 193 bpm, the longest lasting 13 beats with an avg rate of 122 bpm.  Some episodes of Supraventricular Tachycardia may be possible Atrial Tachycardia with variable block.  Isolated SVEs were rare (<1.0%), SVE Couplets were rare (<1.0%), and SVE Triplets were rare (<1.0%).  Isolated VEs were rare (<1.0%), and no VE Couplets or VE Triplets were present.  Nuclear stress test 07/28/17 ([facility]): Impressions: - Normal myocardial perfusion study - No evidence for significant ischemia or scar is noted. - Post stress: Global systolic function is hyperdynamic. The ejection  fraction was greater than 65%. Left ventricular chamber size is relatively  small. - Mild  coronary calcifications are noted - Noted on attenuation CT is non-specific diffuse ground glass apperance  of the lungs  Echo 07/20/17 ([place]):  Normal left ventricular systolic function, ejection fraction 60 to 10%  Diastolic dysfunction - grade I (normal filling pressures)  Dilated left atrium - mild  Aortic regurgitation - mild  Normal right ventricular systolic function    Wynonia Musty [facility] Short Stay Center/Anesthesiology Phone [phone] 05/28/2020 11:19 AM

## 2020-05-29 ENCOUNTER — Inpatient Hospital Stay (HOSPITAL_COMMUNITY)
Admission: RE | Admit: 2020-05-29 | Discharge: 2020-05-31 | DRG: 460 | Disposition: A | Discharge status: Home / Self Care | Payer: Medicare Other | Attending: Neurosurgery | Admitting: Neurosurgery

## 2020-05-29 ENCOUNTER — Inpatient Hospital Stay (HOSPITAL_COMMUNITY)
Admission: RE | Disposition: A | Discharge status: Home / Self Care | Payer: Self-pay | Source: Home / Self Care | Attending: Neurosurgery

## 2020-05-29 ENCOUNTER — Inpatient Hospital Stay (HOSPITAL_COMMUNITY): Payer: Medicare Other

## 2020-05-29 ENCOUNTER — Inpatient Hospital Stay (HOSPITAL_COMMUNITY): Payer: Medicare Other | Admitting: Certified Registered Nurse Anesthetist

## 2020-05-29 ENCOUNTER — Encounter (HOSPITAL_COMMUNITY): Payer: Self-pay | Admitting: Neurosurgery

## 2020-05-29 ENCOUNTER — Other Ambulatory Visit: Payer: Self-pay

## 2020-05-29 ENCOUNTER — Inpatient Hospital Stay (HOSPITAL_COMMUNITY): Payer: Medicare Other | Admitting: Physician Assistant

## 2020-05-29 DIAGNOSIS — T84226A Displacement of internal fixation device of vertebrae, initial encounter: Secondary | ICD-10-CM | POA: Diagnosis present

## 2020-05-29 DIAGNOSIS — Z96652 Presence of left artificial knee joint: Secondary | ICD-10-CM | POA: Diagnosis present

## 2020-05-29 DIAGNOSIS — Z9109 Other allergy status, other than to drugs and biological substances: Secondary | ICD-10-CM | POA: Diagnosis not present

## 2020-05-29 DIAGNOSIS — Y838 Other surgical procedures as the cause of abnormal reaction of the patient, or of later complication, without mention of misadventure at the time of the procedure: Secondary | ICD-10-CM | POA: Diagnosis present

## 2020-05-29 DIAGNOSIS — Z79899 Other long term (current) drug therapy: Secondary | ICD-10-CM | POA: Diagnosis not present

## 2020-05-29 DIAGNOSIS — M4714 Other spondylosis with myelopathy, thoracic region: Secondary | ICD-10-CM | POA: Diagnosis present

## 2020-05-29 DIAGNOSIS — Z7982 Long term (current) use of aspirin: Secondary | ICD-10-CM

## 2020-05-29 DIAGNOSIS — M4120 Other idiopathic scoliosis, site unspecified: Secondary | ICD-10-CM | POA: Diagnosis present

## 2020-05-29 DIAGNOSIS — Z888 Allergy status to other drugs, medicaments and biological substances status: Secondary | ICD-10-CM

## 2020-05-29 DIAGNOSIS — M5414 Radiculopathy, thoracic region: Secondary | ICD-10-CM | POA: Diagnosis present

## 2020-05-29 DIAGNOSIS — Z20822 Contact with and (suspected) exposure to covid-19: Secondary | ICD-10-CM | POA: Diagnosis present

## 2020-05-29 DIAGNOSIS — M4135 Thoracogenic scoliosis, thoracolumbar region: Secondary | ICD-10-CM | POA: Diagnosis present

## 2020-05-29 DIAGNOSIS — Z881 Allergy status to other antibiotic agents status: Secondary | ICD-10-CM | POA: Diagnosis not present

## 2020-05-29 DIAGNOSIS — M899 Disorder of bone, unspecified: Secondary | ICD-10-CM | POA: Diagnosis present

## 2020-05-29 DIAGNOSIS — I10 Essential (primary) hypertension: Secondary | ICD-10-CM | POA: Diagnosis present

## 2020-05-29 DIAGNOSIS — M549 Dorsalgia, unspecified: Secondary | ICD-10-CM | POA: Diagnosis present

## 2020-05-29 DIAGNOSIS — Y792 Prosthetic and other implants, materials and accessory orthopedic devices associated with adverse incidents: Secondary | ICD-10-CM | POA: Diagnosis present

## 2020-05-29 DIAGNOSIS — M4804 Spinal stenosis, thoracic region: Secondary | ICD-10-CM | POA: Diagnosis present

## 2020-05-29 DIAGNOSIS — T84498A Other mechanical complication of other internal orthopedic devices, implants and grafts, initial encounter: Secondary | ICD-10-CM

## 2020-05-29 HISTORY — PX: POSTERIOR CERVICAL FUSION/FORAMINOTOMY: SHX5038

## 2020-05-29 SURGERY — POSTERIOR CERVICAL FUSION/FORAMINOTOMY LEVEL 4
Anesthesia: General

## 2020-05-29 MED ORDER — BISACODYL 10 MG RE SUPP: 10.0000 mg | Freq: Every day | RECTAL | Status: DC | PRN

## 2020-05-29 MED ORDER — CEFAZOLIN SODIUM-DEXTROSE 2-4 GM/100ML-% IV SOLN
2.0000 g | INTRAVENOUS | Status: AC
  Administered 2020-05-29: 2 g via INTRAVENOUS
  Filled 2020-05-29: qty 100

## 2020-05-29 MED ORDER — CHLORHEXIDINE GLUCONATE CLOTH 2 % EX PADS: 6.0000 | MEDICATED_PAD | Freq: Once | CUTANEOUS | Status: DC

## 2020-05-29 MED ORDER — LACTATED RINGERS IV SOLN: INTRAVENOUS | Status: DC

## 2020-05-29 MED ORDER — PROPOFOL 1000 MG/100ML IV EMUL
INTRAVENOUS | Status: AC
  Filled 2020-05-29: qty 100

## 2020-05-29 MED ORDER — CHLORHEXIDINE GLUCONATE 0.12 % MT SOLN
15.0000 mL | Freq: Once | OROMUCOSAL | Status: AC
  Administered 2020-05-29: 15 mL via OROMUCOSAL
  Filled 2020-05-29: qty 15

## 2020-05-29 MED ORDER — LIDOCAINE 2% (20 MG/ML) 5 ML SYRINGE
INTRAMUSCULAR | Status: AC
  Filled 2020-05-29: qty 5

## 2020-05-29 MED ORDER — DENOSUMAB 60 MG/ML ~~LOC~~ SOSY: 60.0000 mg | PREFILLED_SYRINGE | SUBCUTANEOUS | Status: DC

## 2020-05-29 MED ORDER — PSEUDOEPHEDRINE HCL 30 MG PO TABS
30.0000 mg | ORAL_TABLET | ORAL | Status: DC | PRN
  Filled 2020-05-29: qty 1

## 2020-05-29 MED ORDER — DOCUSATE SODIUM 100 MG PO CAPS
100.0000 mg | ORAL_CAPSULE | Freq: Two times a day (BID) | ORAL | Status: DC
  Administered 2020-05-29 – 2020-05-31 (×4): 100 mg via ORAL
  Filled 2020-05-29 (×4): qty 1

## 2020-05-29 MED ORDER — ROCURONIUM BROMIDE 10 MG/ML (PF) SYRINGE
PREFILLED_SYRINGE | INTRAVENOUS | Status: AC
  Filled 2020-05-29: qty 10

## 2020-05-29 MED ORDER — PROPOFOL 10 MG/ML IV BOLUS
INTRAVENOUS | Status: DC | PRN
  Administered 2020-05-29: 150 mg via INTRAVENOUS

## 2020-05-29 MED ORDER — VALACYCLOVIR HCL 500 MG PO TABS
1000.0000 mg | ORAL_TABLET | Freq: Every day | ORAL | Status: DC
  Administered 2020-05-29 – 2020-05-30 (×2): 1000 mg via ORAL
  Filled 2020-05-29 (×3): qty 2

## 2020-05-29 MED ORDER — SODIUM CHLORIDE 0.9 % IV SOLN: 250.0000 mL | INTRAVENOUS | Status: DC

## 2020-05-29 MED ORDER — CALCIUM CARBONATE-VITAMIN D 500-200 MG-UNIT PO TABS
1.0000 | ORAL_TABLET | Freq: Every day | ORAL | Status: DC
  Administered 2020-05-30 – 2020-05-31 (×2): 1 via ORAL
  Filled 2020-05-29 (×2): qty 1

## 2020-05-29 MED ORDER — THROMBIN 5000 UNITS EX SOLR
CUTANEOUS | Status: AC
  Filled 2020-05-29: qty 15000

## 2020-05-29 MED ORDER — ABATACEPT 250 MG IV SOLR: 250.0000 mg | INTRAVENOUS | Status: DC

## 2020-05-29 MED ORDER — FLEET ENEMA 7-19 GM/118ML RE ENEM: 1.0000 | ENEMA | Freq: Once | RECTAL | Status: DC | PRN

## 2020-05-29 MED ORDER — FUROSEMIDE 20 MG PO TABS
20.0000 mg | ORAL_TABLET | Freq: Every day | ORAL | Status: DC
  Administered 2020-05-30 – 2020-05-31 (×2): 20 mg via ORAL
  Filled 2020-05-29 (×2): qty 1

## 2020-05-29 MED ORDER — SUCCINYLCHOLINE CHLORIDE 200 MG/10ML IV SOSY
PREFILLED_SYRINGE | INTRAVENOUS | Status: DC | PRN
  Administered 2020-05-29: 80 mg via INTRAVENOUS

## 2020-05-29 MED ORDER — PROPOFOL 1000 MG/100ML IV EMUL
INTRAVENOUS | Status: AC
  Filled 2020-05-29: qty 200

## 2020-05-29 MED ORDER — PROPOFOL 10 MG/ML IV BOLUS
INTRAVENOUS | Status: AC
  Filled 2020-05-29: qty 40

## 2020-05-29 MED ORDER — HYDROCODONE-ACETAMINOPHEN 5-325 MG PO TABS: 1.0000 | ORAL_TABLET | Freq: Three times a day (TID) | ORAL | Status: DC | PRN

## 2020-05-29 MED ORDER — OXYCODONE HCL 5 MG PO TABS
5.0000 mg | ORAL_TABLET | ORAL | Status: DC | PRN
  Administered 2020-05-29 – 2020-05-30 (×3): 5 mg via ORAL
  Filled 2020-05-29 (×3): qty 1

## 2020-05-29 MED ORDER — ACETAMINOPHEN 650 MG RE SUPP: 650.0000 mg | RECTAL | Status: DC | PRN

## 2020-05-29 MED ORDER — DEXAMETHASONE SODIUM PHOSPHATE 10 MG/ML IJ SOLN
INTRAMUSCULAR | Status: AC
  Filled 2020-05-29: qty 1

## 2020-05-29 MED ORDER — THROMBIN 5000 UNITS EX SOLR
CUTANEOUS | Status: DC | PRN
  Administered 2020-05-29 (×2): 5000 [IU] via TOPICAL

## 2020-05-29 MED ORDER — FENTANYL CITRATE (PF) 250 MCG/5ML IJ SOLN
INTRAMUSCULAR | Status: AC
  Filled 2020-05-29: qty 5

## 2020-05-29 MED ORDER — TRANEXAMIC ACID 1000 MG/10ML IV SOLN
2000.0000 mg | INTRAVENOUS | Status: AC
  Administered 2020-05-29: 2000 mg via TOPICAL
  Filled 2020-05-29: qty 20

## 2020-05-29 MED ORDER — LORATADINE 10 MG PO TABS
10.0000 mg | ORAL_TABLET | Freq: Every day | ORAL | Status: DC
  Administered 2020-05-30 – 2020-05-31 (×2): 10 mg via ORAL
  Filled 2020-05-29 (×2): qty 1

## 2020-05-29 MED ORDER — LIDOCAINE-EPINEPHRINE 1 %-1:100000 IJ SOLN
INTRAMUSCULAR | Status: AC
  Filled 2020-05-29: qty 1

## 2020-05-29 MED ORDER — PHENYLEPHRINE HCL-NACL 10-0.9 MG/250ML-% IV SOLN
INTRAVENOUS | Status: DC | PRN
  Administered 2020-05-29: 40 ug/min via INTRAVENOUS

## 2020-05-29 MED ORDER — ONDANSETRON HCL 4 MG/2ML IJ SOLN
INTRAMUSCULAR | Status: DC | PRN
  Administered 2020-05-29: 4 mg via INTRAVENOUS

## 2020-05-29 MED ORDER — HYDROCODONE-ACETAMINOPHEN 5-325 MG PO TABS
2.0000 | ORAL_TABLET | ORAL | Status: DC | PRN
  Administered 2020-05-30 – 2020-05-31 (×3): 2 via ORAL
  Filled 2020-05-29 (×3): qty 2

## 2020-05-29 MED ORDER — METHOCARBAMOL 500 MG PO TABS
500.0000 mg | ORAL_TABLET | Freq: Four times a day (QID) | ORAL | Status: DC | PRN
  Administered 2020-05-30 (×3): 500 mg via ORAL
  Filled 2020-05-29 (×3): qty 1

## 2020-05-29 MED ORDER — MIDAZOLAM HCL 2 MG/2ML IJ SOLN
INTRAMUSCULAR | Status: DC | PRN
  Administered 2020-05-29: 2 mg via INTRAVENOUS

## 2020-05-29 MED ORDER — ORAL CARE MOUTH RINSE: 15.0000 mL | Freq: Once | OROMUCOSAL | Status: AC

## 2020-05-29 MED ORDER — ALUM & MAG HYDROXIDE-SIMETH 200-200-20 MG/5ML PO SUSP: 30.0000 mL | Freq: Four times a day (QID) | ORAL | Status: DC | PRN

## 2020-05-29 MED ORDER — EPHEDRINE SULFATE-NACL 50-0.9 MG/10ML-% IV SOSY
PREFILLED_SYRINGE | INTRAVENOUS | Status: DC | PRN
  Administered 2020-05-29: 5 mg via INTRAVENOUS
  Administered 2020-05-29: 10 mg via INTRAVENOUS

## 2020-05-29 MED ORDER — TRAZODONE HCL 150 MG PO TABS
150.0000 mg | ORAL_TABLET | Freq: Every day | ORAL | Status: DC
  Administered 2020-05-29 – 2020-05-30 (×2): 150 mg via ORAL
  Filled 2020-05-29 (×2): qty 1

## 2020-05-29 MED ORDER — ONDANSETRON HCL 4 MG/2ML IJ SOLN
INTRAMUSCULAR | Status: AC
  Filled 2020-05-29: qty 2

## 2020-05-29 MED ORDER — IPRATROPIUM BROMIDE 0.06 % NA SOLN: 2.0000 | Freq: Two times a day (BID) | NASAL | Status: DC | PRN

## 2020-05-29 MED ORDER — IRBESARTAN 300 MG PO TABS
300.0000 mg | ORAL_TABLET | Freq: Every day | ORAL | Status: DC
Start: 2020-05-29 — End: 2020-05-31
  Administered 2020-05-30 – 2020-05-31 (×2): 300 mg via ORAL
  Filled 2020-05-29 (×2): qty 1

## 2020-05-29 MED ORDER — METHOCARBAMOL 500 MG PO TABS
ORAL_TABLET | ORAL | Status: AC
  Administered 2020-05-29: 500 mg via ORAL
  Filled 2020-05-29: qty 1

## 2020-05-29 MED ORDER — LIDOCAINE-EPINEPHRINE 1 %-1:100000 IJ SOLN
INTRAMUSCULAR | Status: DC | PRN
  Administered 2020-05-29: 15 mL

## 2020-05-29 MED ORDER — INDOMETHACIN ER 75 MG PO CPCR
75.0000 mg | ORAL_CAPSULE | Freq: Every day | ORAL | Status: DC
  Administered 2020-05-30 – 2020-05-31 (×2): 75 mg via ORAL
  Filled 2020-05-29 (×2): qty 1

## 2020-05-29 MED ORDER — KETOROLAC TROMETHAMINE 15 MG/ML IJ SOLN
7.5000 mg | Freq: Four times a day (QID) | INTRAMUSCULAR | Status: AC
  Administered 2020-05-29 – 2020-05-30 (×3): 7.5 mg via INTRAVENOUS
  Filled 2020-05-29 (×3): qty 1

## 2020-05-29 MED ORDER — BUDESONIDE 0.5 MG/2ML IN SUSP
0.5000 mg | Freq: Two times a day (BID) | RESPIRATORY_TRACT | Status: DC
  Administered 2020-05-29 – 2020-05-31 (×4): 0.5 mg via RESPIRATORY_TRACT
  Filled 2020-05-29 (×4): qty 2

## 2020-05-29 MED ORDER — SODIUM CHLORIDE 0.9% FLUSH: 3.0000 mL | INTRAVENOUS | Status: DC | PRN

## 2020-05-29 MED ORDER — LIDOCAINE 2% (20 MG/ML) 5 ML SYRINGE
INTRAMUSCULAR | Status: DC | PRN
  Administered 2020-05-29: 60 mg via INTRAVENOUS

## 2020-05-29 MED ORDER — HEMOSTATIC AGENTS (NO CHARGE) OPTIME
TOPICAL | Status: DC | PRN
  Administered 2020-05-29: 1 via TOPICAL

## 2020-05-29 MED ORDER — HYDROMORPHONE HCL 1 MG/ML IJ SOLN: 0.5000 mg | INTRAMUSCULAR | Status: DC | PRN

## 2020-05-29 MED ORDER — ONDANSETRON HCL 4 MG/2ML IJ SOLN: 4.0000 mg | Freq: Four times a day (QID) | INTRAMUSCULAR | Status: DC | PRN

## 2020-05-29 MED ORDER — FAMOTIDINE 20 MG PO TABS
40.0000 mg | ORAL_TABLET | Freq: Every day | ORAL | Status: DC
  Administered 2020-05-29 – 2020-05-30 (×2): 40 mg via ORAL
  Filled 2020-05-29 (×2): qty 2

## 2020-05-29 MED ORDER — OXYCODONE HCL 5 MG PO TABS
ORAL_TABLET | ORAL | Status: AC
  Administered 2020-05-29: 5 mg via ORAL
  Filled 2020-05-29: qty 1

## 2020-05-29 MED ORDER — BUPIVACAINE HCL (PF) 0.5 % IJ SOLN
INTRAMUSCULAR | Status: DC | PRN
  Administered 2020-05-29: 15 mL

## 2020-05-29 MED ORDER — METHOCARBAMOL 1000 MG/10ML IJ SOLN
500.0000 mg | Freq: Four times a day (QID) | INTRAVENOUS | Status: DC | PRN
  Filled 2020-05-29: qty 5

## 2020-05-29 MED ORDER — DEXAMETHASONE SODIUM PHOSPHATE 10 MG/ML IJ SOLN
INTRAMUSCULAR | Status: DC | PRN
  Administered 2020-05-29: 10 mg via INTRAVENOUS

## 2020-05-29 MED ORDER — FENTANYL CITRATE (PF) 100 MCG/2ML IJ SOLN
25.0000 ug | INTRAMUSCULAR | Status: DC | PRN
  Administered 2020-05-29 (×2): 50 ug via INTRAVENOUS

## 2020-05-29 MED ORDER — LACTATED RINGERS IV SOLN: INTRAVENOUS | Status: DC | PRN

## 2020-05-29 MED ORDER — BUPIVACAINE LIPOSOME 1.3 % IJ SUSP
20.0000 mL | INTRAMUSCULAR | Status: AC
  Administered 2020-05-29: 20 mL
  Filled 2020-05-29: qty 20

## 2020-05-29 MED ORDER — KETAMINE HCL 10 MG/ML IJ SOLN
INTRAMUSCULAR | Status: DC | PRN
  Administered 2020-05-29 (×2): 10 mg via INTRAVENOUS
  Administered 2020-05-29: 30 mg via INTRAVENOUS

## 2020-05-29 MED ORDER — POTASSIUM CHLORIDE IN NACL 20-0.9 MEQ/L-% IV SOLN
INTRAVENOUS | Status: DC
  Filled 2020-05-29 (×2): qty 1000

## 2020-05-29 MED ORDER — PHENOL 1.4 % MT LIQD: 1.0000 | OROMUCOSAL | Status: DC | PRN

## 2020-05-29 MED ORDER — MONTELUKAST SODIUM 10 MG PO TABS
10.0000 mg | ORAL_TABLET | Freq: Every day | ORAL | Status: DC
  Administered 2020-05-29 – 2020-05-30 (×2): 10 mg via ORAL
  Filled 2020-05-29 (×2): qty 1

## 2020-05-29 MED ORDER — OXYCODONE HCL 5 MG/5ML PO SOLN: 5.0000 mg | Freq: Once | ORAL | Status: AC | PRN

## 2020-05-29 MED ORDER — SODIUM CHLORIDE (PF) 0.9 % IJ SOLN
INTRAMUSCULAR | Status: DC | PRN
  Administered 2020-05-29: 20 mL

## 2020-05-29 MED ORDER — CHLORHEXIDINE GLUCONATE 0.12 % MT SOLN: 15.0000 mL | Freq: Once | OROMUCOSAL | Status: DC

## 2020-05-29 MED ORDER — PHENYLEPHRINE 40 MCG/ML (10ML) SYRINGE FOR IV PUSH (FOR BLOOD PRESSURE SUPPORT)
PREFILLED_SYRINGE | INTRAVENOUS | Status: DC | PRN
  Administered 2020-05-29: 40 ug via INTRAVENOUS
  Administered 2020-05-29: 120 ug via INTRAVENOUS

## 2020-05-29 MED ORDER — MENTHOL 3 MG MT LOZG: 1.0000 | LOZENGE | OROMUCOSAL | Status: DC | PRN

## 2020-05-29 MED ORDER — THROMBIN 5000 UNITS EX SOLR: OROMUCOSAL | Status: DC | PRN

## 2020-05-29 MED ORDER — POLYETHYLENE GLYCOL 3350 17 G PO PACK
17.0000 g | PACK | Freq: Every day | ORAL | Status: DC | PRN
  Administered 2020-05-30 – 2020-05-31 (×2): 17 g via ORAL
  Filled 2020-05-29 (×2): qty 1

## 2020-05-29 MED ORDER — ASCORBIC ACID 500 MG PO TABS
500.0000 mg | ORAL_TABLET | Freq: Every day | ORAL | Status: DC
Start: 2020-05-29 — End: 2020-05-31
  Administered 2020-05-30 – 2020-05-31 (×2): 500 mg via ORAL
  Filled 2020-05-29 (×2): qty 1

## 2020-05-29 MED ORDER — GABAPENTIN 300 MG PO CAPS
300.0000 mg | ORAL_CAPSULE | Freq: Every day | ORAL | Status: DC
  Administered 2020-05-29 – 2020-05-30 (×2): 300 mg via ORAL
  Filled 2020-05-29 (×2): qty 1

## 2020-05-29 MED ORDER — ALBUTEROL SULFATE HFA 108 (90 BASE) MCG/ACT IN AERS: 2.0000 | INHALATION_SPRAY | RESPIRATORY_TRACT | Status: DC | PRN

## 2020-05-29 MED ORDER — KETAMINE HCL 50 MG/5ML IJ SOSY
PREFILLED_SYRINGE | INTRAMUSCULAR | Status: AC
  Filled 2020-05-29: qty 5

## 2020-05-29 MED ORDER — PREDNISONE 5 MG PO TABS
5.0000 mg | ORAL_TABLET | Freq: Every day | ORAL | Status: DC
  Administered 2020-05-30 – 2020-05-31 (×2): 5 mg via ORAL
  Filled 2020-05-29 (×2): qty 1

## 2020-05-29 MED ORDER — OXYCODONE HCL 5 MG PO TABS
5.0000 mg | ORAL_TABLET | Freq: Once | ORAL | Status: AC | PRN
Start: 2020-05-29 — End: 2020-05-29

## 2020-05-29 MED ORDER — ONDANSETRON HCL 4 MG PO TABS: 4.0000 mg | ORAL_TABLET | Freq: Four times a day (QID) | ORAL | Status: DC | PRN

## 2020-05-29 MED ORDER — ESCITALOPRAM OXALATE 10 MG PO TABS
20.0000 mg | ORAL_TABLET | Freq: Every day | ORAL | Status: DC
Start: 2020-05-30 — End: 2020-05-31
  Administered 2020-05-30 – 2020-05-31 (×2): 20 mg via ORAL
  Filled 2020-05-29 (×2): qty 2

## 2020-05-29 MED ORDER — THROMBIN 5000 UNITS EX SOLR
CUTANEOUS | Status: AC
  Filled 2020-05-29: qty 5000

## 2020-05-29 MED ORDER — DIPHENHYDRAMINE HCL 25 MG PO CAPS: 50.0000 mg | ORAL_CAPSULE | Freq: Every day | ORAL | Status: DC | PRN

## 2020-05-29 MED ORDER — CHLORTHALIDONE 25 MG PO TABS
25.0000 mg | ORAL_TABLET | Freq: Every day | ORAL | Status: DC
  Administered 2020-05-29 – 2020-05-31 (×3): 25 mg via ORAL
  Filled 2020-05-29 (×3): qty 1

## 2020-05-29 MED ORDER — 0.9 % SODIUM CHLORIDE (POUR BTL) OPTIME
TOPICAL | Status: DC | PRN
  Administered 2020-05-29: 1000 mL

## 2020-05-29 MED ORDER — KETOROLAC TROMETHAMINE 15 MG/ML IJ SOLN
INTRAMUSCULAR | Status: AC
  Administered 2020-05-29: 7.5 mg via INTRAVENOUS
  Filled 2020-05-29: qty 1

## 2020-05-29 MED ORDER — ORAL CARE MOUTH RINSE: 15.0000 mL | Freq: Once | OROMUCOSAL | Status: DC

## 2020-05-29 MED ORDER — POLYVINYL ALCOHOL 1.4 % OP SOLN: 1.0000 [drp] | Freq: Three times a day (TID) | OPHTHALMIC | Status: DC | PRN

## 2020-05-29 MED ORDER — BACITRACIN ZINC 500 UNIT/GM EX OINT
TOPICAL_OINTMENT | CUTANEOUS | Status: AC
  Filled 2020-05-29: qty 28.35

## 2020-05-29 MED ORDER — AZELASTINE HCL 0.1 % NA SOLN
1.0000 | Freq: Two times a day (BID) | NASAL | Status: DC
  Administered 2020-05-29 – 2020-05-31 (×4): 1 via NASAL
  Filled 2020-05-29: qty 30

## 2020-05-29 MED ORDER — SODIUM CHLORIDE 0.9% FLUSH
3.0000 mL | Freq: Two times a day (BID) | INTRAVENOUS | Status: DC
  Administered 2020-05-29 – 2020-05-31 (×4): 3 mL via INTRAVENOUS

## 2020-05-29 MED ORDER — PANTOPRAZOLE SODIUM 40 MG IV SOLR: 40.0000 mg | Freq: Every day | INTRAVENOUS | Status: DC

## 2020-05-29 MED ORDER — MIDAZOLAM HCL 2 MG/2ML IJ SOLN
INTRAMUSCULAR | Status: AC
  Filled 2020-05-29: qty 2

## 2020-05-29 MED ORDER — ADULT MULTIVITAMIN W/MINERALS CH
1.0000 | ORAL_TABLET | Freq: Every day | ORAL | Status: DC
  Administered 2020-05-30 – 2020-05-31 (×2): 1 via ORAL
  Filled 2020-05-29 (×2): qty 1

## 2020-05-29 MED ORDER — LIFITEGRAST 5 % OP SOLN: 1.0000 [drp] | Freq: Every day | OPHTHALMIC | Status: DC

## 2020-05-29 MED ORDER — FENTANYL CITRATE (PF) 100 MCG/2ML IJ SOLN
INTRAMUSCULAR | Status: AC
  Filled 2020-05-29: qty 2

## 2020-05-29 MED ORDER — PANTOPRAZOLE SODIUM 40 MG PO TBEC
40.0000 mg | DELAYED_RELEASE_TABLET | Freq: Every day | ORAL | Status: DC
  Administered 2020-05-30 – 2020-05-31 (×2): 40 mg via ORAL
  Filled 2020-05-29 (×2): qty 1

## 2020-05-29 MED ORDER — GUAIFENESIN ER 600 MG PO TB12: 600.0000 mg | ORAL_TABLET | Freq: Two times a day (BID) | ORAL | Status: DC | PRN

## 2020-05-29 MED ORDER — FENTANYL CITRATE (PF) 250 MCG/5ML IJ SOLN
INTRAMUSCULAR | Status: DC | PRN
  Administered 2020-05-29 (×3): 100 ug via INTRAVENOUS
  Administered 2020-05-29: 150 ug via INTRAVENOUS

## 2020-05-29 MED ORDER — SUCCINYLCHOLINE CHLORIDE 200 MG/10ML IV SOSY
PREFILLED_SYRINGE | INTRAVENOUS | Status: AC
  Filled 2020-05-29: qty 10

## 2020-05-29 MED ORDER — PROPOFOL 500 MG/50ML IV EMUL
INTRAVENOUS | Status: DC | PRN
  Administered 2020-05-29: 50 ug/kg/min via INTRAVENOUS

## 2020-05-29 MED ORDER — ZOLPIDEM TARTRATE 5 MG PO TABS: 5.0000 mg | ORAL_TABLET | Freq: Every evening | ORAL | Status: DC | PRN

## 2020-05-29 MED ORDER — CEFAZOLIN SODIUM-DEXTROSE 2-4 GM/100ML-% IV SOLN
2.0000 g | Freq: Three times a day (TID) | INTRAVENOUS | Status: AC
  Administered 2020-05-29 – 2020-05-30 (×2): 2 g via INTRAVENOUS
  Filled 2020-05-29 (×2): qty 100

## 2020-05-29 MED ORDER — BUPIVACAINE HCL (PF) 0.5 % IJ SOLN
INTRAMUSCULAR | Status: AC
  Filled 2020-05-29: qty 30

## 2020-05-29 MED ORDER — ACETAMINOPHEN 325 MG PO TABS: 650.0000 mg | ORAL_TABLET | ORAL | Status: DC | PRN

## 2020-05-29 SURGICAL SUPPLY — 93 items
ADH SKN CLS APL DERMABOND .7 (GAUZE/BANDAGES/DRESSINGS) ×4
BAND INSRT 18 STRL LF DISP RB (MISCELLANEOUS)
BAND RUBBER #18 3X1/16 STRL (MISCELLANEOUS) IMPLANT
BASKET BONE COLLECTION (BASKET) ×1 IMPLANT
BIT DRILL NEURO 2X3.1 SFT TUCH (MISCELLANEOUS) IMPLANT
BLADE CLIPPER SURG (BLADE) IMPLANT
BLADE SURG 11 STRL SS (BLADE) IMPLANT
BLADE ULTRA TIP 2M (BLADE) IMPLANT
BUR PRECISION FLUTE 5.0 (BURR) ×1 IMPLANT
CANISTER SUCT 3000ML PPV (MISCELLANEOUS) ×1 IMPLANT
CARTRIDGE OIL MAESTRO DRILL (MISCELLANEOUS) ×1 IMPLANT
CONT SPEC 4OZ CLIKSEAL STRL BL (MISCELLANEOUS) ×1 IMPLANT
COVER WAND RF STERILE (DRAPES) ×2 IMPLANT
DECANTER SPIKE VIAL GLASS SM (MISCELLANEOUS) ×2 IMPLANT
DERMABOND ADVANCED (GAUZE/BANDAGES/DRESSINGS) ×4
DERMABOND ADVANCED .7 DNX12 (GAUZE/BANDAGES/DRESSINGS) IMPLANT
DIFFUSER DRILL AIR PNEUMATIC (MISCELLANEOUS) ×2 IMPLANT
DRAPE C-ARM 42X72 X-RAY (DRAPES) ×4 IMPLANT
DRAPE C-ARMOR (DRAPES) IMPLANT
DRAPE LAPAROTOMY 100X72 PEDS (DRAPES) ×2 IMPLANT
DRAPE MICROSCOPE LEICA (MISCELLANEOUS) IMPLANT
DRAPE MICROSCOPE LEICA 46X105 (MISCELLANEOUS) ×1 IMPLANT
DRILL NEURO 2X3.1 SOFT TOUCH (MISCELLANEOUS) ×2
DRSG OPSITE POSTOP 4X10 (GAUZE/BANDAGES/DRESSINGS) ×3 IMPLANT
DRSG PAD ABDOMINAL 8X10 ST (GAUZE/BANDAGES/DRESSINGS) IMPLANT
DURAPREP 6ML APPLICATOR 50/CS (WOUND CARE) ×2 IMPLANT
ELECT BLADE 4.0 EZ CLEAN MEGAD (MISCELLANEOUS) ×2
ELECT REM PT RETURN 9FT ADLT (ELECTROSURGICAL) ×2
ELECTRODE BLDE 4.0 EZ CLN MEGD (MISCELLANEOUS) ×1 IMPLANT
ELECTRODE REM PT RTRN 9FT ADLT (ELECTROSURGICAL) ×1 IMPLANT
EVACUATOR 1/8 PVC DRAIN (DRAIN) ×1 IMPLANT
FEE INTRAOP MONITOR IMPULS NCS (MISCELLANEOUS) IMPLANT
GAUZE 4X4 16PLY RFD (DISPOSABLE) IMPLANT
GAUZE SPONGE 4X4 12PLY STRL (GAUZE/BANDAGES/DRESSINGS) ×2 IMPLANT
GLOVE BIO SURGEON STRL SZ8 (GLOVE) ×2 IMPLANT
GLOVE BIOGEL PI IND STRL 8.5 (GLOVE) ×1 IMPLANT
GLOVE BIOGEL PI INDICATOR 8.5 (GLOVE) ×1
GLOVE ECLIPSE 8.0 STRL XLNG CF (GLOVE) ×2 IMPLANT
GLOVE SRG 8 PF TXTR STRL LF DI (GLOVE) ×1 IMPLANT
GLOVE SURG UNDER POLY LF SZ6.5 (GLOVE) ×1 IMPLANT
GLOVE SURG UNDER POLY LF SZ8 (GLOVE) ×2
GOWN STRL REUS W/ TWL LRG LVL3 (GOWN DISPOSABLE) IMPLANT
GOWN STRL REUS W/ TWL XL LVL3 (GOWN DISPOSABLE) IMPLANT
GOWN STRL REUS W/TWL 2XL LVL3 (GOWN DISPOSABLE) IMPLANT
GOWN STRL REUS W/TWL LRG LVL3 (GOWN DISPOSABLE)
GOWN STRL REUS W/TWL XL LVL3 (GOWN DISPOSABLE)
HEMOSTAT POWDER KIT SURGIFOAM (HEMOSTASIS) ×2 IMPLANT
HEMOSTAT SURGICEL 2X14 (HEMOSTASIS) IMPLANT
HOOK RELINE O LAMINA WIDE 6MM (Hook) ×2 IMPLANT
HOOK RELINE O LAMINA WIDE 8MM (Hook) ×2 IMPLANT
INTRAOP MONITOR FEE IMPULS NCS (MISCELLANEOUS) ×1
INTRAOP MONITOR FEE IMPULSE (MISCELLANEOUS) ×2
KIT BASIN OR (CUSTOM PROCEDURE TRAY) ×2 IMPLANT
KIT TURNOVER KIT B (KITS) ×2 IMPLANT
MARKER SKIN DUAL TIP RULER LAB (MISCELLANEOUS) ×3 IMPLANT
MILL MEDIUM DISP (BLADE) ×1 IMPLANT
NDL HYPO 18GX1.5 BLUNT FILL (NEEDLE) IMPLANT
NDL HYPO 21X1.5 SAFETY (NEEDLE) IMPLANT
NDL HYPO 25X1 1.5 SAFETY (NEEDLE) ×1 IMPLANT
NDL SPNL 22GX3.5 QUINCKE BK (NEEDLE) ×1 IMPLANT
NEEDLE HYPO 18GX1.5 BLUNT FILL (NEEDLE) IMPLANT
NEEDLE HYPO 21X1.5 SAFETY (NEEDLE) ×2 IMPLANT
NEEDLE HYPO 25X1 1.5 SAFETY (NEEDLE) ×2 IMPLANT
NEEDLE SPNL 22GX3.5 QUINCKE BK (NEEDLE) IMPLANT
NS IRRIG 1000ML POUR BTL (IV SOLUTION) ×2 IMPLANT
OIL CARTRIDGE MAESTRO DRILL (MISCELLANEOUS) ×2
PACK LAMINECTOMY NEURO (CUSTOM PROCEDURE TRAY) ×2 IMPLANT
PIN MAYFIELD SKULL DISP (PIN) ×1 IMPLANT
PUTTY DBX 10CC (Bone Implant) ×1 IMPLANT
ROD RELINE 5.5X500MM STRAIGHT (Rod) ×2 IMPLANT
SCREW LOCK RELINE 5.5 TULIP (Screw) ×10 IMPLANT
SCREW RELINE POLY 4.5X40MM (Screw) ×2 IMPLANT
SCREW RELINE POLY 5.5X50MM (Screw) ×1 IMPLANT
SCREW RELINE-O POLY 5.5X40 (Screw) ×1 IMPLANT
SCREW RELINE-O POLY 5.5X45MM (Screw) ×3 IMPLANT
SCREW RLINE PLY 2S 40X4.5XPA (Screw) IMPLANT
SPONGE INTESTINAL PEANUT (DISPOSABLE) IMPLANT
SPONGE SURGIFOAM ABS GEL SZ50 (HEMOSTASIS) ×2 IMPLANT
STAPLER SKIN PROX WIDE 3.9 (STAPLE) ×2 IMPLANT
SUT ETHILON 3 0 FSL (SUTURE) ×1 IMPLANT
SUT VIC AB 0 CT1 18XCR BRD8 (SUTURE) ×1 IMPLANT
SUT VIC AB 0 CT1 8-18 (SUTURE)
SUT VIC AB 1 CT1 18XBRD ANBCTR (SUTURE) IMPLANT
SUT VIC AB 1 CT1 8-18 (SUTURE) ×8
SUT VIC AB 2-0 CP2 18 (SUTURE) ×5 IMPLANT
SUT VIC AB 3-0 SH 8-18 (SUTURE) ×4 IMPLANT
SYR 20ML LL LF (SYRINGE) ×1 IMPLANT
SYR 3ML LL SCALE MARK (SYRINGE) IMPLANT
TOWEL GREEN STERILE (TOWEL DISPOSABLE) ×2 IMPLANT
TOWEL GREEN STERILE FF (TOWEL DISPOSABLE) ×2 IMPLANT
TRAY FOLEY MTR SLVR 16FR STAT (SET/KITS/TRAYS/PACK) ×1 IMPLANT
UNDERPAD 30X36 HEAVY ABSORB (UNDERPADS AND DIAPERS) ×1 IMPLANT
WATER STERILE IRR 1000ML POUR (IV SOLUTION) ×2 IMPLANT

## 2020-05-29 NOTE — Progress Notes (Signed)
Orthopedic Tech Progress Note Patient Details:  Vanessa Harmon December 31, 1950 [id number] Patient has brace Patient ID: [name], female   DOB: [birth year], 70 y.o.   MRN: [id number]   [name] 05/29/2020, 5:53 PM

## 2020-05-29 NOTE — Interval H&P Note (Signed)
History and Physical Interval Note:  05/29/2020 7:31 AM  Vanessa Harmon  has presented today for surgery, with the diagnosis of Loosening of hardware in spine.  The various methods of treatment have been discussed with the patient and family. After consideration of risks, benefits and other options for treatment, the patient has consented to  Procedure(s): Removal of hardware, Replacement of thoracic 8, thoracic 9, thoracic 10 screws, Right Thoracic 6-7, Right Thoracic 7-8 Laminectomies with thoracic hooks from Thoracic 4-5 to Thoracic 10 (N/A) as a surgical intervention.  The patient's history has been reviewed, patient examined, no change in status, stable for surgery.  I have reviewed the patient's chart and labs.  Questions were answered to the patient's satisfaction.     Peggyann Shoals

## 2020-05-29 NOTE — Transfer of Care (Signed)
Immediate Anesthesia Transfer of Care Note  Patient: Vanessa Harmon  Procedure(s) Performed: Removal of hardware, Replacement of thoracic 8, thoracic 9, thoracic 10 screws, Right Thoracic 6-7, Right Thoracic 7-8 Laminectomies with thoracic hooks from Thoracic 4-5 to Thoracic 10 (N/A )  Patient Location: PACU  Anesthesia Type:General  Level of Consciousness: awake, alert  and oriented  Airway & Oxygen Therapy: Patient Spontanous Breathing and Patient connected to face mask oxygen  Post-op Assessment: Report given to RN, Post -op Vital signs reviewed and stable and Patient moving all extremities  Post vital signs: Reviewed and stable  Last Vitals:  Vitals Value Taken Time  BP 117/58 05/29/20 1247  Temp    Pulse 71 05/29/20 1255  Resp 15 05/29/20 1255  SpO2 98 % 05/29/20 1255  Vitals shown include unvalidated device data.  Last Pain:  Vitals:   05/29/20 0615  TempSrc:   PainSc: 2       Patients Stated Pain Goal: 2 (74/82/70 7867)  Complications: No complications documented. Bruising to tongue noted (movement of mouth during neuromonitoring stimulation). Bite block in place during surgery.

## 2020-05-29 NOTE — Anesthesia Preprocedure Evaluation (Signed)
Anesthesia Evaluation  Patient identified by MRN, date of birth, ID band Patient awake    Reviewed: Allergy & Precautions, H&P , NPO status , Patient's Chart, lab work & pertinent test results  Airway Mallampati: II   Neck ROM: full    Dental   Pulmonary asthma , former smoker,    breath sounds clear to auscultation       Cardiovascular hypertension, + dysrhythmias Supra Ventricular Tachycardia  Rhythm:regular Rate:Normal     Neuro/Psych PSYCHIATRIC DISORDERS Anxiety Depression  Neuromuscular disease    GI/Hepatic GERD  ,  Endo/Other    Renal/GU      Musculoskeletal  (+) Arthritis , Fibromyalgia -  Abdominal   Peds  Hematology   Anesthesia Other Findings   Reproductive/Obstetrics                             Anesthesia Physical Anesthesia Plan  ASA: III  Anesthesia Plan: General   Post-op Pain Management:    Induction: Intravenous  PONV Risk Score and Plan: 3 and Ondansetron, Dexamethasone and Treatment may vary due to age or medical condition  Airway Management Planned: Oral ETT  Additional Equipment:   Intra-op Plan:   Post-operative Plan: Extubation in OR  Informed Consent: I have reviewed the patients History and Physical, chart, labs and discussed the procedure including the risks, benefits and alternatives for the proposed anesthesia with the patient or authorized representative who has indicated his/her understanding and acceptance.     Dental advisory given  Plan Discussed with: CRNA, Anesthesiologist and Surgeon  Anesthesia Plan Comments:         Anesthesia Quick Evaluation

## 2020-05-29 NOTE — Brief Op Note (Signed)
05/29/2020  12:49 PM  PATIENT:  Vanessa Harmon  70 y.o. female  PRE-OPERATIVE DIAGNOSIS:  Thoracic cord compression, Loosening of hardware in spine, thoracic spine pain, radiculopathy, myelopathy  POST-OPERATIVE DIAGNOSIS: Thoracic cord compression, Loosening of hardware in spine, thoracic spine pain, radiculopathy, myelopathy   PROCEDURE:  Procedure(s): Exploration of fusion L 2 - T 8, Removal of hardware, Replacement of thoracic 8, thoracic 9, thoracic 10 screws,Thoracic 6-7 andThoracic 7-8 Laminectomies with thoracic hooks from Thoracic 4-5 to Thoracic 10 (N/A) with posterolateral arthrodesis with autograft and DBM  SURGEON:  Surgeon(s) and Role:    Erline Levine, MD - Primary    * Dawley, Theodoro Doing, DO - Assisting  PHYSICIAN ASSISTANT:   ASSISTANTS: Poteat, RN   ANESTHESIA:   general  EBL:  200 mL   BLOOD ADMINISTERED:none  DRAINS: (Medium ) Hemovact drain(s) in the epidural space with  Suction Open   LOCAL MEDICATIONS USED:  MARCAINE    and LIDOCAINE   SPECIMEN:  No Specimen  DISPOSITION OF SPECIMEN:  N/A  COUNTS:  YES  TOURNIQUET:  * No tourniquets in log *  DICTATION: Patient is 70 year old woman with scoliosis and prior fusion L 5 - T 8 with loosening of screws at T 8 and heterotopic ossification at T 67 and T 78 levels with cord and nerve root compression on the right with severe back and right sided pain due to myelopathy and radiculopathy.  She has 2 mm pedicles in the upper thoracic region, so it was elected to perform thoracic hook fixation at the upper aspect of the construct.  It was elected to perform spinal cord monitoring throughout the procedure.    Procedure: Patient was placed in a prone position on the OR table after smooth and uncomplicated induction of general endotracheal anesthesia. Her upper, mid and low back was prepped and draped in usual sterile fashion with betadine scrub and DuraPrep after marking relevant bony anatomy with Carm. Area of  incision was infiltrated with local lidocaine. Incision was made to the thoracic and lumbodorsal fascia was incised and exposure was performed of the T 4 - L 2 spinous processes laminae facet joint and transverse processes. The previously placed hardware was removed to the lumbar connectors.  The thoraco-lumbar region appeared to be well healed without abnormal motion and confluent onlay bone over the posterior elements.  Hardware was replaced at T 10 (5.5 x 45 mm), T 9 (5.5 x 40 mm) , T 8 (right 5.5 x 50 mm) and (5.5 x 45 mm left). levels. Laminar hooks were placed bilaterally at T 4 (8 mm) and T 5 (6 mm) bilaterally. Spinal cord dura was decompressed with laminectomy T 6 - T 8 levels.  Under microscopic visualization, the spinal cord dura was decompressed and right T 6 and T 7 nerve roots.  Heterotopic calcification was removed and all neural elements were well decompressed.   Rods were then cut and curved and locked down in situ with compression on the T 4 and T 5 hooks.  Final X ray showed well positioned hardware. There was good correction of scoliotic curvature. The posterolateral region was extensively decorticated. Intraoperative fluoroscopy confirmed correct orientationin the AP and lateral plane and positioning of all screws.  Final x-rays demonstrated well-positioned pedicle screw fixation. The posterolateral region was packed with local autograft and 10 cc DBM.  The wound was irrigated. Long-acting Marcaine was injected in the deep musculature and a medium Hemovac drain was placed.  Fascia was closed with  1 Vicryl sutures skin edges were reapproximated 2 and 3-0 Vicryl sutures. The wound was dressed with Dermabond and an occlusive dressing.  The patient was extubated in the operating room and taken to recovery in stable satisfactory condition she tolerated the opeeration well counts were correct at the end of the case.   PLAN OF CARE: Admit to inpatient   PATIENT DISPOSITION:  PACU - hemodynamically  stable.   Delay start of Pharmacological VTE agent (>24hrs) due to surgical blood loss or risk of bleeding: yes

## 2020-05-29 NOTE — Anesthesia Procedure Notes (Deleted)
Central Venous Catheter Insertion Performed by: Albertha Ghee, MD, anesthesiologist Start/End2/02/2021 7:03 AM, 05/29/2020 7:06 AM Patient location: Pre-op. Preanesthetic checklist: patient identified, IV checked, site marked, risks and benefits discussed, surgical consent, monitors and equipment checked, pre-op evaluation, timeout performed and anesthesia consent Hand hygiene performed  and maximum sterile barriers used  PA cath was placed.Swan type:thermodilution Procedure performed without using ultrasound guided technique. Attempts: 1 Patient tolerated the procedure well with no immediate complications.

## 2020-05-29 NOTE — Anesthesia Procedure Notes (Deleted)
Central Venous Catheter Insertion Performed by: Albertha Ghee, MD, anesthesiologist Start/End2/02/2021 6:50 AM, 05/29/2020 7:04 AM Patient location: Pre-op. Preanesthetic checklist: patient identified, IV checked, site marked, risks and benefits discussed, surgical consent, monitors and equipment checked, pre-op evaluation, timeout performed and anesthesia consent Position: Trendelenburg Lidocaine 1% used for infiltration and patient sedated Hand hygiene performed , maximum sterile barriers used  and Seldinger technique used Catheter size: 8.5 Fr Central line was placed.Sheath introducer Procedure performed using ultrasound guided technique. Ultrasound Notes:anatomy identified, needle tip was noted to be adjacent to the nerve/plexus identified, no ultrasound evidence of intravascular and/or intraneural injection and image(s) printed for medical record Attempts: 1 Following insertion, line sutured, dressing applied and Biopatch. Post procedure assessment: blood return through all ports, free fluid flow and no air  Patient tolerated the procedure well with no immediate complications.

## 2020-05-29 NOTE — Op Note (Signed)
05/29/2020  12:49 PM  PATIENT:  Vanessa Harmon  70 y.o. female  PRE-OPERATIVE DIAGNOSIS:  Thoracic cord compression, Loosening of hardware in spine, thoracic spine pain, radiculopathy, myelopathy  POST-OPERATIVE DIAGNOSIS: Thoracic cord compression, Loosening of hardware in spine, thoracic spine pain, radiculopathy, myelopathy   PROCEDURE:  Procedure(s): Exploration of fusion L 2 - T 8, Removal of hardware, Replacement of thoracic 8, thoracic 9, thoracic 10 screws,Thoracic 6-7 andThoracic 7-8 Laminectomies with thoracic hooks from Thoracic 4-5 to Thoracic 10 (N/A) with posterolateral arthrodesis with autograft and DBM  SURGEON:  Surgeon(s) and Role:    Erline Levine, MD - Primary    * Dawley, Theodoro Doing, DO - Assisting  PHYSICIAN ASSISTANT:   ASSISTANTS: Poteat, RN   ANESTHESIA:   general  EBL:  200 mL   BLOOD ADMINISTERED:none  DRAINS: (Medium ) Hemovact drain(s) in the epidural space with  Suction Open   LOCAL MEDICATIONS USED:  MARCAINE    and LIDOCAINE   SPECIMEN:  No Specimen  DISPOSITION OF SPECIMEN:  N/A  COUNTS:  YES  TOURNIQUET:  * No tourniquets in log *  DICTATION: Patient is 70 year old woman with scoliosis and prior fusion L 5 - T 8 with loosening of screws at T 8 and heterotopic ossification at T 67 and T 78 levels with cord and nerve root compression on the right with severe back and right sided pain due to myelopathy and radiculopathy.  She has 2 mm pedicles in the upper thoracic region, so it was elected to perform thoracic hook fixation at the upper aspect of the construct.  It was elected to perform spinal cord monitoring throughout the procedure.    Procedure: Patient was placed in a prone position on the OR table after smooth and uncomplicated induction of general endotracheal anesthesia. Her upper, mid and low back was prepped and draped in usual sterile fashion with betadine scrub and DuraPrep after marking relevant bony anatomy with Carm. Area of  incision was infiltrated with local lidocaine. Incision was made to the thoracic and lumbodorsal fascia was incised and exposure was performed of the T 4 - L 2 spinous processes laminae facet joint and transverse processes. The previously placed hardware was removed to the lumbar connectors.  The thoraco-lumbar region appeared to be well healed without abnormal motion and confluent onlay bone over the posterior elements.  Hardware was replaced at T 10 (5.5 x 45 mm), T 9 (5.5 x 40 mm) , T 8 (right 5.5 x 50 mm) and (5.5 x 45 mm left). levels. Laminar hooks were placed bilaterally at T 4 (8 mm) and T 5 (6 mm) bilaterally. Spinal cord dura was decompressed with laminectomy T 6 - T 8 levels.  Under microscopic visualization, the spinal cord dura was decompressed and right T 6 and T 7 nerve roots.  Heterotopic calcification was removed and all neural elements were well decompressed.   Rods were then cut and curved and locked down in situ with compression on the T 4 and T 5 hooks.  Final X ray showed well positioned hardware. There was good correction of scoliotic curvature. The posterolateral region was extensively decorticated. Intraoperative fluoroscopy confirmed correct orientationin the AP and lateral plane and positioning of all screws.  Final x-rays demonstrated well-positioned pedicle screw fixation. The posterolateral region was packed with local autograft and 10 cc DBM.  The wound was irrigated. Long-acting Marcaine was injected in the deep musculature and a medium Hemovac drain was placed.  Fascia was closed with  1 Vicryl sutures skin edges were reapproximated 2 and 3-0 Vicryl sutures. The wound was dressed with Dermabond and an occlusive dressing.  The patient was extubated in the operating room and taken to recovery in stable satisfactory condition she tolerated the opeeration well counts were correct at the end of the case.   PLAN OF CARE: Admit to inpatient   PATIENT DISPOSITION:  PACU - hemodynamically  stable.   Delay start of Pharmacological VTE agent (>24hrs) due to surgical blood loss or risk of bleeding: yes

## 2020-05-29 NOTE — Anesthesia Procedure Notes (Addendum)
Procedure Name: Intubation Date/Time: 05/29/2020 7:49 AM Performed by: Leonor Liv, CRNA Pre-anesthesia Checklist: Patient identified, Emergency Drugs available, Suction available and Patient being monitored Patient Re-evaluated:Patient Re-evaluated prior to induction Oxygen Delivery Method: Circle System Utilized Preoxygenation: Pre-oxygenation with 100% oxygen Induction Type: IV induction Ventilation: Mask ventilation without difficulty Laryngoscope Size: Mac and 3 Grade View: Grade I Tube type: Oral Tube size: 7.0 mm Number of attempts: 1 Airway Equipment and Method: Stylet,  Oral airway and Bite block Placement Confirmation: ETT inserted through vocal cords under direct vision,  positive ETCO2 and breath sounds checked- equal and bilateral Secured at: 21 cm Tube secured with: Tape Dental Injury: Teeth and Oropharynx as per pre-operative assessment

## 2020-05-30 NOTE — Progress Notes (Signed)
Patient said she does not want to take her neb treatment at this time. She said she will let the RN know when she is ready because at home she do it later. RN aware

## 2020-05-30 NOTE — Progress Notes (Signed)
Subjective: Patient reports feeling much better.  No longer having stabbing preop pain  Objective: Vital signs in last 24 hours: Temp:  [97.2 F (36.2 C)-98.9 F (37.2 C)] 98.9 F (37.2 C) (02/12 0325) Pulse Rate:  [61-85] 61 (02/12 0325) Resp:  [9-20] 18 (02/12 0325) BP: (98-136)/(49-81) 108/50 (02/12 0325) SpO2:  [93 %-100 %] 97 % (02/12 0325)  Intake/Output from previous day: 02/11 0701 - 02/12 0700 In: 2571.8 [I.V.:2271.8; IV Piggyback:100] Out: 2565 [Urine:2230; Drains:135; Blood:200] Intake/Output this shift: Total I/O In: -  Out: 40 [Drains:40]  Physical Exam: Strength full. Dressing CDI.    Lab Results: Recent Labs    05/27/20 1333  WBC 12.7*  HGB 13.4  HCT 40.6  PLT 350   BMET Recent Labs    05/27/20 1333  NA 128*  K 4.3  CL 91*  CO2 27  GLUCOSE 117*  BUN 17  CREATININE 0.96  CALCIUM 9.3    Studies/Results: DG Thoracic Spine 2 View  Result Date: 05/29/2020 CLINICAL DATA:  Loosening of hardware in spine. Additional history provided: Removal of hardware, replacement of thoracic 8, thoracic 9, thoracic 10 screws, right thoracic 6-7, right thoracic 7-8 laminectomies and placement of thoracic hooks. Provided fluoroscopy time 24 seconds (7.55 mGy). EXAM: THORACIC SPINE 2 VIEWS; DG C-ARM 1-60 MIN COMPARISON:  CT of the thoracic spine 02/19/2020. FINDINGS: PA and lateral view intraoperative fluoroscopic images of the thoracic spine are submitted, 6 images total. The images demonstrate bilateral pedicle screws at what are presumed to be the T8, T9 and T10 levels (given the provided history). Associated vertical interconnecting rods bilaterally which extend superiorly to the site of thoracic hooks at presumably the T4-T5 level. IMPRESSION: Six intraoperative fluoroscopic images of the thoracic spine, as described. Electronically Signed   By: Kellie Simmering DO   On: 05/29/2020 12:39   DG C-Arm 1-60 Min  Result Date: 05/29/2020 CLINICAL DATA:  Loosening of hardware  in spine. Additional history provided: Removal of hardware, replacement of thoracic 8, thoracic 9, thoracic 10 screws, right thoracic 6-7, right thoracic 7-8 laminectomies and placement of thoracic hooks. Provided fluoroscopy time 24 seconds (7.55 mGy). EXAM: THORACIC SPINE 2 VIEWS; DG C-ARM 1-60 MIN COMPARISON:  CT of the thoracic spine 02/19/2020. FINDINGS: PA and lateral view intraoperative fluoroscopic images of the thoracic spine are submitted, 6 images total. The images demonstrate bilateral pedicle screws at what are presumed to be the T8, T9 and T10 levels (given the provided history). Associated vertical interconnecting rods bilaterally which extend superiorly to the site of thoracic hooks at presumably the T4-T5 level. IMPRESSION: Six intraoperative fluoroscopic images of the thoracic spine, as described. Electronically Signed   By: Kellie Simmering DO   On: 05/29/2020 12:39    Assessment/Plan: Patient is doing well.  Mobilize with PT.  Will keep drain in place today.  Anticipate D/C in am.    LOS: 1 day    Peggyann Shoals, MD 05/30/2020, 7:45 AM

## 2020-05-30 NOTE — Evaluation (Signed)
Occupational Therapy Evaluation Patient Details Name: Vanessa Harmon MRN: [id number] DOB: 1951-01-12 Today's Date: 05/30/2020    History of Present Illness The pt is a 70 yo female presenting s/p revision of prior spinal fusion T8-L2 on 2/11 to address progressive back and R-sided pain. PMH includes: prior spinal surgeries and fusion, R THA, L TKA, L wrist fracture and instrumentation.   Clinical Impression   Patient is s/p evision of prior spinal fusion T8-L2 surgery resulting in the deficits listed below (see OT Problem List). Pt at this time able to report precautions but required occasional cues to follow in session. Pt able to don/doff brace. Pt able to complete toilet tasks with supervision to min guard. Pt able to complete transfers with use of RW and supervision to min guard to complete ADLS at this time. Patient will benefit from skilled OT to increase their safety and independence with ADL and functional mobility for ADL (while adhering to their precautions) to facilitate discharge to venue listed below.       Follow Up Recommendations  Supervision - Intermittent;Other (comment) (OP PT)    Equipment Recommendations       Recommendations for Other Services       Precautions / Restrictions Precautions Precautions: Back;Fall Precaution Booklet Issued: No Precaution Comments: reviewed verbally with pt who was able to teachback from prior surgical experience. Required Braces or Orthoses: Spinal Brace Spinal Brace: Lumbar corset;Applied in sitting position Restrictions Weight Bearing Restrictions: No      Mobility Bed Mobility Overal bed mobility: Needs Assistance Bed Mobility: Rolling;Sidelying to Sit;Sit to Sidelying Rolling: Min guard Sidelying to sit: Min guard     Sit to sidelying: Min guard General bed mobility comments: minG for line management only, pt able to rise to sitting EOB from flat bed with no assist, use of bed rail only    Transfers Overall  transfer level: Needs assistance Equipment used: Rolling walker (2 wheeled) Transfers: Sit to/from Stand Sit to Stand: Supervision         General transfer comment: no physical assist needed, good hand placement and use of RW to steady    Balance Overall balance assessment: Mild deficits observed, not formally tested                                         ADL either performed or assessed with clinical judgement   ADL Overall ADL's : Needs assistance/impaired Eating/Feeding: Independent;Sitting   Grooming: Wash/dry hands;Wash/dry face;Modified independent;Standing   Upper Body Bathing: Modified independent;Sitting   Lower Body Bathing: Set up;Cueing for safety;Cueing for sequencing;Sit to/from stand   Upper Body Dressing : Modified independent;Sitting   Lower Body Dressing: Supervision/safety;Cueing for safety;Cueing for sequencing;Sit to/from stand   Toilet Transfer: Modified Independent;Grab bars   Toileting- Water quality scientist and Hygiene: Cueing for safety;Cueing for sequencing;Sit to/from stand;Supervision/safety   Tub/ Banker: Supervision/safety;Cueing for safety;Cueing for sequencing   Functional mobility during ADLs: Supervision/safety;Rolling walker General ADL Comments: cues for following precautions     Vision Baseline Vision/History: Wears glasses Patient Visual Report: No change from baseline Vision Assessment?: No apparent visual deficits     Perception     Praxis Praxis Praxis tested?: Not tested    Pertinent Vitals/Pain Pain Assessment: Faces Faces Pain Scale: Hurts a little bit Pain Location: back with movement Pain Descriptors / Indicators: Discomfort;Grimacing Pain Intervention(s): Limited activity within patient's tolerance;Monitored during session  Hand Dominance Right   Extremity/Trunk Assessment Upper Extremity Assessment Upper Extremity Assessment: Overall WFL for tasks assessed   Lower Extremity  Assessment Lower Extremity Assessment: Defer to PT evaluation   Cervical / Trunk Assessment Cervical / Trunk Assessment: Other exceptions Cervical / Trunk Exceptions: s/p spinal surgery   Communication Communication Communication: No difficulties   Cognition Arousal/Alertness: Awake/alert Behavior During Therapy: WFL for tasks assessed/performed Overall Cognitive Status: Within Functional Limits for tasks assessed                                 General Comments: did require a few repeated cues in distracting environements, but good safety awareness and insight   General Comments  VSS on RA    Exercises     Shoulder Instructions      Home Living Family/patient expects to be discharged to:: Private residence Living Arrangements: Spouse/significant other Available Help at Discharge: Family;Available 24 hours/day Type of Home: House Home Access: Stairs to enter [employer] of Steps: 3 Entrance Stairs-Rails: Left Home Layout: Laundry or work area in basement;One level;Able to live on main level with bedroom/bathroom     Bathroom Shower/Tub: Tub/shower unit   Bathroom Toilet: Handicapped height Bathroom Accessibility: Yes How Accessible: Accessible via walker Home Equipment: [place] - 2 wheels;Cane - quad;Cane - single point;Bedside commode;Shower seat;Grab bars - tub/shower   Additional Comments: pt has equipment as well as reacher/supplies from prior surgeries      Prior Functioning/Environment Level of Independence: Independent        Comments: pt mobilizing without use of AD, able to complete ADLs without assist        OT Problem List: Decreased strength;Decreased activity tolerance;Decreased safety awareness;Pain      OT Treatment/Interventions: Self-care/ADL training;Therapeutic exercise;Energy conservation;DME and/or AE instruction;Patient/family education;Balance training    OT Goals(Current goals can be found in the care plan  section) Acute Rehab OT Goals Patient Stated Goal: return home OT Goal Formulation: With patient Time For Goal Achievement: 06/13/20 Potential to Achieve Goals: Good ADL Goals Pt Will Perform Lower Body Bathing: with modified independence;sit to/from stand Pt Will Perform Lower Body Dressing: with modified independence;sit to/from stand  OT Frequency: Min 2X/week   Barriers to D/C:            Co-evaluation PT/OT/SLP Co-Evaluation/Treatment: Yes Reason for Co-Treatment: Complexity of the patient's impairments (multi-system involvement) PT goals addressed during session: Mobility/safety with mobility;Balance;Proper use of DME OT goals addressed during session: ADL's and self-care      AM-PAC OT "6 Clicks" Daily Activity     Outcome Measure Help from another person eating meals?: None Help from another person taking care of personal grooming?: None Help from another person toileting, which includes using toliet, bedpan, or urinal?: A Little Help from another person bathing (including washing, rinsing, drying)?: A Little Help from another person to put on and taking off regular upper body clothing?: None Help from another person to put on and taking off regular lower body clothing?: A Little 6 Click Score: 21   End of Session Equipment Utilized During Treatment: Gait belt;Rolling walker  Activity Tolerance: Patient tolerated treatment well Patient left: in bed;with call bell/phone within reach  OT Visit Diagnosis: Unsteadiness on feet (R26.81);Pain;Muscle weakness (generalized) (M62.81) Pain - part of body:  (back)                Time: 4431-5400 OT Time Calculation (min): 28 min Charges:  OT General Charges $OT Visit: 1 Visit OT Evaluation $OT Eval Low Complexity: [place] OTR/L  Acute Rehab Services  [phone] office number [phone] pager number   Joeseph Amor 05/30/2020, 4:27 PM

## 2020-05-30 NOTE — Evaluation (Signed)
Physical Therapy Evaluation Patient Details Name: Vanessa Harmon MRN: [id number] DOB: May 18, 1950 Today's Date: 05/30/2020   History of Present Illness  The pt is a 70 yo female presenting s/p revision of prior spinal fusion T8-L2 on 2/11 to address progressive back and R-sided pain. PMH includes: prior spinal surgeries and fusion, R THA, L TKA, L wrist fracture and instrumentation.    Clinical Impression  Pt in bed upon arrival of PT, agreeable to evaluation at this time. Prior to admission the pt was independent with mobility, ADLs, and IADLs without use of AD. The pt now presents with minor limitations in functional mobility, power, dynamic stability, and endurance due to above dx, and will continue to benefit from skilled PT to address these deficits, but is safe to return home with family assist when medically cleared. The pt was able to verbalize all spinal precautions from prior surgeries, and was able to demo good mobility in bed, with transfers, and during hallway ambulation with use of RW and supervision for safety. The pt demos good safety understanding, and expressed understanding all all education regarding safety with mobility at home. The pt will continue to benefit from skilled PT to practice stair navigation and progress gait and stability.       Follow Up Recommendations No PT follow up;Supervision for mobility/OOB    Equipment Recommendations  None recommended by PT    Recommendations for Other Services       Precautions / Restrictions Precautions Precautions: Back;Fall Precaution Booklet Issued: No Precaution Comments: reviewed verbally with pt who was able to teachback from prior surgical experience. Required Braces or Orthoses: Spinal Brace Spinal Brace: Lumbar corset;Applied in sitting position Restrictions Weight Bearing Restrictions: No      Mobility  Bed Mobility Overal bed mobility: Needs Assistance Bed Mobility: Rolling;Sidelying to Sit;Sit to  Sidelying Rolling: Min guard Sidelying to sit: Min guard     Sit to sidelying: Min guard General bed mobility comments: minG for line management only, pt able to rise to sitting EOB from flat bed with no assist, use of bed rail only    Transfers Overall transfer level: Needs assistance Equipment used: Rolling walker (2 wheeled) Transfers: Sit to/from Stand Sit to Stand: Supervision         General transfer comment: no physical assist needed, good hand placement and use of RW to steady  Ambulation/Gait Ambulation/Gait assistance: Supervision Gait Distance (Feet): 250 Feet Assistive device: Rolling walker (2 wheeled) Gait Pattern/deviations: Step-through pattern;Decreased stride length;Trunk flexed Gait velocity: 0.4 m/s Gait velocity interpretation: <1.31 ft/sec, indicative of household ambulator General Gait Details: pt with slight trunk flexion with gait, able to correct when cued.     Balance Overall balance assessment: Mild deficits observed, not formally tested                                           Pertinent Vitals/Pain Pain Assessment: Faces Faces Pain Scale: Hurts a little bit Pain Location: back with movement Pain Descriptors / Indicators: Discomfort;Grimacing Pain Intervention(s): Monitored during session;Repositioned;Premedicated before session    Home Living Family/patient expects to be discharged to:: Private residence Living Arrangements: Spouse/significant other Available Help at Discharge: Family;Available 24 hours/day Type of Home: House Home Access: Stairs to enter Entrance Stairs-Rails: Left Entrance Stairs-Number of Steps: 3 Home Layout: Laundry or work area in basement;One level;Able to live on main level with bedroom/bathroom Home  Equipment: Gilford Rile - 2 wheels;Cane - quad;Cane - single point;Bedside commode;Shower seat;Grab bars - tub/shower Additional Comments: pt has equipment as well as reacher/supplies from prior  surgeries    Prior Function Level of Independence: Independent         Comments: pt mobilizing without use of AD, able to complete ADLs without assist     Hand Dominance   Dominant Hand: Right    Extremity/Trunk Assessment   Upper Extremity Assessment Upper Extremity Assessment: Defer to OT evaluation    Lower Extremity Assessment Lower Extremity Assessment: Overall WFL for tasks assessed    Cervical / Trunk Assessment Cervical / Trunk Assessment: Other exceptions Cervical / Trunk Exceptions: s/p spinal surgery  Communication   Communication: No difficulties  Cognition Arousal/Alertness: Awake/alert Behavior During Therapy: WFL for tasks assessed/performed Overall Cognitive Status: Within Functional Limits for tasks assessed                                 General Comments: did require a few repeated cues in distracting environements, but good safety awareness and insight      General Comments General comments (skin integrity, edema, etc.): VSS on RA    Exercises     Assessment/Plan    PT Assessment Patient needs continued PT services  PT Problem List Decreased strength;Decreased activity tolerance;Decreased balance;Pain       PT Treatment Interventions DME instruction;Gait training;Stair training;Functional mobility training;Therapeutic activities;Therapeutic exercise;Balance training;Patient/family education    PT Goals (Current goals can be found in the Care Plan section)  Acute Rehab PT Goals Patient Stated Goal: return home PT Goal Formulation: With patient Time For Goal Achievement: 06/13/20 Potential to Achieve Goals: Good    Frequency Min 5X/week   Barriers to discharge        Co-evaluation PT/OT/SLP Co-Evaluation/Treatment: Yes Reason for Co-Treatment: For patient/therapist safety;To address functional/ADL transfers PT goals addressed during session: Mobility/safety with mobility;Balance;Proper use of DME         AM-PAC  PT "6 Clicks" Mobility  Outcome Measure Help needed turning from your back to your side while in a flat bed without using bedrails?: A Little Help needed moving from lying on your back to sitting on the side of a flat bed without using bedrails?: A Little Help needed moving to and from a bed to a chair (including a wheelchair)?: A Little Help needed standing up from a chair using your arms (e.g., wheelchair or bedside chair)?: A Little Help needed to walk in hospital room?: A Little Help needed climbing 3-5 steps with a railing? : A Little 6 Click Score: 18    End of Session Equipment Utilized During Treatment: Back brace Activity Tolerance: Patient tolerated treatment well Patient left: in bed;with call bell/phone within reach Nurse Communication: Mobility status PT Visit Diagnosis: Other abnormalities of gait and mobility (R26.89);Pain Pain - part of body:  (back)    Time: 1937-9024 PT Time Calculation (min) (ACUTE ONLY): 29 min   Charges:   PT Evaluation $PT Eval Low Complexity: 1 Low          Karma Ganja, PT, DPT   Acute Rehabilitation Department Pager #: [phone]  Otho Bellows 05/30/2020, 2:20 PM

## 2020-05-30 NOTE — Anesthesia Postprocedure Evaluation (Signed)
Anesthesia Post Note  Patient: Vanessa Harmon  Procedure(s) Performed: Removal of hardware, Replacement of thoracic 8, thoracic 9, thoracic 10 screws, Right Thoracic 6-7, Right Thoracic 7-8 Laminectomies with thoracic hooks from Thoracic 4-5 to Thoracic 10 (N/A )     Patient location during evaluation: PACU Anesthesia Type: General Level of consciousness: awake and alert Pain management: pain level controlled Vital Signs Assessment: post-procedure vital signs reviewed and stable Respiratory status: spontaneous breathing, nonlabored ventilation, respiratory function stable and patient connected to nasal cannula oxygen Cardiovascular status: blood pressure returned to baseline and stable Postop Assessment: no apparent nausea or vomiting Anesthetic complications: no   No complications documented.  Last Vitals:  Vitals:   05/29/20 2232 05/30/20 0325  BP: (!) 115/49 (!) 108/50  Pulse: 61 61  Resp: 20 18  Temp: 37.1 C 37.2 C  SpO2: 94% 97%    Last Pain:  Vitals:   05/30/20 0425  TempSrc:   PainSc: [place]

## 2020-05-31 MED ORDER — ASPIRIN EC 81 MG PO TBEC: 81.0000 mg | DELAYED_RELEASE_TABLET | Freq: Every day | ORAL | 11 refills | Status: AC

## 2020-05-31 MED ORDER — OXYCODONE HCL 5 MG PO TABS: 5.0000 mg | ORAL_TABLET | ORAL | 0 refills | Status: DC | PRN

## 2020-05-31 MED ORDER — METHOCARBAMOL 500 MG PO TABS: 500.0000 mg | ORAL_TABLET | Freq: Four times a day (QID) | ORAL | 2 refills | Status: AC | PRN

## 2020-05-31 NOTE — Progress Notes (Signed)
Physical Therapy Treatment Patient Details Name: Vanessa Harmon MRN: [id number] DOB: Aug 14, 1950 Today's Date: 05/31/2020    History of Present Illness The pt is a 70 yo female presenting s/p revision of prior spinal fusion T8-L2 on 2/11 to address progressive back and R-sided pain. PMH includes: prior spinal surgeries and fusion, R THA, L TKA, L wrist fracture and instrumentation.    PT Comments    Pt ambulatory in hallway with use of RW, overall requiring supervision level of assist only for pt safety. Pt proficiently navigated steps, pt's husband present for duration of session and very supportive. Pt met all acute PT goals, plan for d/c today, pt with no further questions.     Follow Up Recommendations  No PT follow up;Supervision for mobility/OOB     Equipment Recommendations  None recommended by PT    Recommendations for Other Services       Precautions / Restrictions Precautions Precautions: Back;Fall Precaution Booklet Issued: No Precaution Comments: pt with good knowledge/application of spinal precautions Required Braces or Orthoses: Spinal Brace Spinal Brace: Lumbar corset;Applied in sitting position Restrictions Weight Bearing Restrictions: No    Mobility  Bed Mobility Overal bed mobility: Needs Assistance Bed Mobility: Rolling;Sidelying to Sit;Sit to Sidelying Rolling: Supervision Sidelying to sit: Supervision     Sit to sidelying: Supervision General bed mobility comments: for safety, good log roll technique both in and out of bed    Transfers Overall transfer level: Needs assistance Equipment used: Rolling walker (2 wheeled) Transfers: Sit to/from Stand Sit to Stand: Modified independent (Device/Increase time)         General transfer comment: mod I for increased time to rise  Ambulation/Gait Ambulation/Gait assistance: Modified independent (Device/Increase time) Gait Distance (Feet): 185 Feet Assistive device: Rolling walker (2  wheeled) Gait Pattern/deviations: Step-through pattern;Decreased stride length;Trunk flexed Gait velocity: slightly decr   General Gait Details: Mod I for increased time, verbal cuing for upright posture and placement in RW.   Stairs Stairs: Yes Stairs assistance: Supervision Stair Management: One rail Left;Step to pattern;Forwards Number of Stairs: 5 General stair comments: for safety, verbal cuing for sequencing with step-to gait as opposed to step-over-step for pt safety   Wheelchair Mobility    Modified Rankin (Stroke Patients Only)       Balance Overall balance assessment: Mild deficits observed, not formally tested                                          Cognition Arousal/Alertness: Awake/alert Behavior During Therapy: WFL for tasks assessed/performed Overall Cognitive Status: Within Functional Limits for tasks assessed                                        Exercises Other Exercises Other Exercises: Home walking program: up and walking 1x/hour during waking hours, to promote strength maintainence, decrease stiffness, and promote circulation    General Comments        Pertinent Vitals/Pain Faces Pain Scale: Hurts a little bit Pain Location: back Pain Descriptors / Indicators: Discomfort Pain Intervention(s): Limited activity within patient's tolerance;Monitored during session;Repositioned;Premedicated before session    Home Living                      Prior Function  PT Goals (current goals can now be found in the care plan section) Acute Rehab PT Goals Patient Stated Goal: return home PT Goal Formulation: With patient Time For Goal Achievement: 06/13/20 Potential to Achieve Goals: Good Progress towards PT goals: Progressing toward goals    Frequency    Min 5X/week      PT Plan Current plan remains appropriate    Co-evaluation              AM-PAC PT "6 Clicks" Mobility    Outcome Measure  Help needed turning from your back to your side while in a flat bed without using bedrails?: A Little Help needed moving from lying on your back to sitting on the side of a flat bed without using bedrails?: A Little Help needed moving to and from a bed to a chair (including a wheelchair)?: A Little Help needed standing up from a chair using your arms (e.g., wheelchair or bedside chair)?: A Little Help needed to walk in hospital room?: A Little Help needed climbing 3-5 steps with a railing? : A Little 6 Click Score: 18    End of Session Equipment Utilized During Treatment: Back brace Activity Tolerance: Patient tolerated treatment well Patient left: in bed;with call bell/phone within reach;with family/visitor present Nurse Communication: Mobility status PT Visit Diagnosis: Other abnormalities of gait and mobility (R26.89);Pain Pain - part of body:  (back)     Time: 8301-5996 PT Time Calculation (min) (ACUTE ONLY): 13 min  Charges:  $Gait Training: 8-22 mins                     Stacie Glaze, PT Acute Rehabilitation Services Pager [phone]  Office [phone]    [place] 05/31/2020, 9:59 AM

## 2020-05-31 NOTE — Discharge Summary (Signed)
Physician Discharge Summary  Patient ID: Vanessa Harmon MRN: [id number] DOB/AGE: 71/26/1952 71 y.o.  Admit date: 05/29/2020 Discharge date: 05/31/2020  Admission Diagnoses:  Thoracic spondylosis with cord compression  Discharge Diagnoses:  Same Active Problems:   Thoracic spondylosis with cord compression   Discharged Condition: Stable  Hospital Course:  Vanessa Harmon is a 70 y.o. female who was admitted for the below procedure. There were no post operative complications. At time of discharge, pain was well controlled, ambulating with Pt/OT, tolerating po, voiding normal. Ready for discharge.  Treatments: Surgery Exploration of fusion L 2 - T 8, Removal of hardware, Replacement of thoracic 8, thoracic 9, thoracic 10 screws,Thoracic 6-7 andThoracic 7-8 Laminectomies with thoracic hooks from Thoracic 4-5 to Thoracic 10 (N/A) with posterolateral arthrodesis with autograft and DBM  Discharge Exam: Blood pressure (!) 117/42, pulse 69, temperature 97.6 F (36.4 C), resp. rate 16, height 5' 5.5" (1.664 m), weight 80.1 kg, SpO2 90 %. Awake, alert, oriented Speech fluent, appropriate CN grossly intact MAEW Wound c/d/i  Disposition: Discharge disposition: 01-Home or Self Care       Discharge Instructions    Call MD for:  difficulty breathing, headache or visual disturbances   Complete by: As directed    Call MD for:  persistant dizziness or light-headedness   Complete by: As directed    Call MD for:  redness, tenderness, or signs of infection (pain, swelling, redness, odor or green/yellow discharge around incision site)   Complete by: As directed    Call MD for:  severe uncontrolled pain   Complete by: As directed    Call MD for:  temperature >100.4   Complete by: As directed    Diet - low sodium heart healthy   Complete by: As directed    Driving Restrictions   Complete by: As directed    Do not drive until given clearance.   Increase activity slowly   Complete by:  As directed    Lifting restrictions   Complete by: As directed    Do not lift anything >10lbs. Avoid bending and twisting in awkward positions. Avoid bending at the back.   May shower / Bathe   Complete by: As directed    In 24 hours. Okay to wash wound with warm soapy water. Avoid scrubbing the wound. Pat dry.   Remove dressing in 48 hours   Complete by: As directed      Allergies as of 05/31/2020      Reactions   Breo Ellipta [fluticasone Furoate-vilanterol] Other (See Comments)   Severe hoarseness    Fosamax [alendronate Sodium] Other (See Comments)   myalgias   Levaquin [levofloxacin] Other (See Comments)   Lightheadedness, not feeling well, ear pain   Infliximab Other (See Comments)   Skin reaction   Spiriva Handihaler [tiotropium Bromide Monohydrate]    UNSPECIFIED REACTION    Verapamil    Tape Rash   PAPER TAPE: Causes severe rash and weeping      Medication List    TAKE these medications   abatacept 250 MG injection Commonly known as: ORENCIA Inject 250 mg into the vein every 30 (thirty) days.   albuterol 108 (90 Base) MCG/ACT inhaler Commonly known as: ProAir HFA Inhale two puffs every 4-6 hours if needed for cough or wheeze. What changed:   how much to take  how to take this  when to take this  reasons to take this   aspirin EC 81 MG tablet Take 1 tablet (81 mg  total) by mouth daily. Swallow whole. Start taking on: June 05, 2020 What changed: These instructions start on June 05, 2020. If you are unsure what to do until then, ask your doctor or other care provider.   azelastine 0.1 % nasal spray Commonly known as: ASTELIN Place 1 spray into both nostrils 2 (two) times daily. Use in each nostril as directed   CALCIUM 600/VITAMIN D3 PO Take 1 tablet by mouth daily.   carboxymethylcellulose 0.5 % Soln Commonly known as: REFRESH PLUS Place 1 drop into both eyes 3 (three) times daily as needed (dry eyes).   chlorthalidone 25 MG  tablet Commonly known as: HYGROTON Take 25 mg by mouth daily.   denosumab 60 MG/ML Sosy injection Commonly known as: PROLIA Inject 60 mg into the skin every 6 (six) months.   diphenhydrAMINE 25 MG tablet Commonly known as: BENADRYL Take 50 mg by mouth daily as needed for allergies.   escitalopram 20 MG tablet Commonly known as: LEXAPRO TAKE 1 TABLET BY MOUTH DAILY   famotidine 40 MG tablet Commonly known as: PEPCID TAKE 1 TABLET (40 MG TOTAL) BY MOUTH AT BEDTIME.   FISH OIL PO Take 1,000 mg by mouth daily.   fluticasone 220 MCG/ACT inhaler Commonly known as: FLOVENT HFA Inhale 2 puffs into the lungs 2 (two) times daily.   furosemide 20 MG tablet Commonly known as: LASIX TAKE 1 TABLET (20 MG TOTAL) BY MOUTH DAILY.   gabapentin 300 MG capsule Commonly known as: NEURONTIN Take 300 mg by mouth at bedtime.   guaiFENesin 600 MG 12 hr tablet Commonly known as: MUCINEX Take 600 mg by mouth 2 (two) times daily as needed for cough or to loosen phlegm.   HYDROcodone-acetaminophen 5-325 MG tablet Commonly known as: Norco 1-2 tabs po q6 hours prn pain What changed:   how much to take  how to take this  when to take this  reasons to take this  additional instructions   indomethacin 75 MG CR capsule Commonly known as: INDOCIN SR Take 75 mg by mouth daily with breakfast.   ipratropium 0.03 % nasal spray Commonly known as: ATROVENT Place 1 spray into both nostrils 2 (two) times daily as needed for rhinitis.   irbesartan 300 MG tablet Commonly known as: AVAPRO Take 300 mg by mouth daily.   loratadine 10 MG tablet Commonly known as: CLARITIN Take 10 mg by mouth daily.   methocarbamol 500 MG tablet Commonly known as: ROBAXIN Take 1 tablet (500 mg total) by mouth every 6 (six) hours as needed for muscle spasms.   montelukast 10 MG tablet Commonly known as: SINGULAIR Take 1 tablet (10 mg total) by mouth at bedtime.   multivitamin capsule Take 1 capsule by  mouth daily.   omeprazole 40 MG capsule Commonly known as: PRILOSEC TAKE ONE CAPSULE BY MOUTH DAILY BEFORE BREAKFAST DAILY What changed:   how much to take  how to take this  when to take this   oxyCODONE 5 MG immediate release tablet Commonly known as: Oxy IR/ROXICODONE Take 1 tablet (5 mg total) by mouth every 4 (four) hours as needed.   predniSONE 5 MG tablet Commonly known as: DELTASONE Take 5 mg by mouth daily with breakfast.   PRESCRIPTION MEDICATION Place 1 spray into the nose in the morning and at bedtime. Budesonide 0.5 mg / 2 mL Nasal rinse   pseudoephedrine 30 MG tablet Commonly known as: SUDAFED Take 30 mg by mouth every 4 (four) hours as needed for congestion.  traZODone 50 MG tablet Commonly known as: DESYREL Take 150 mg by mouth at bedtime.   valACYclovir 1000 MG tablet Commonly known as: VALTREX Take 1,000 mg by mouth daily.   vitamin C 500 MG tablet Commonly known as: ASCORBIC ACID Take 500 mg by mouth daily.   Xiidra 5 % Soln Generic drug: Lifitegrast Place 1 drop into both eyes in the morning and at bedtime.       Follow-up Information    Erline Levine, MD Follow up.   Specialty: Neurosurgery Contact information: [address] [place] [address] [phone]               Signed: Traci Sermon 05/31/2020, 8:53 AM

## 2020-05-31 NOTE — Progress Notes (Signed)
Pt discharged to home. Pt and her husband were both educated on discharge material by this RN with no questions asked. Belongings packed, peripheral IVs removed, and hemovac removed, per order. Pt escorted to private vehicle in wheelchair.   Justice Rocher, RN

## 2020-05-31 NOTE — Progress Notes (Signed)
  NEUROSURGERY PROGRESS NOTE   No issues overnight.  Pain manageable No new N/T/W  EXAM:  BP (!) 117/42 (BP Location: Right Arm)   Pulse 69   Temp 97.6 F (36.4 C)   Resp 16   Ht 5' 5.5" (1.664 m)   Wt 80.1 kg   SpO2 90%   BMI 28.92 kg/m   Awake, alert, oriented  Speech fluent, appropriate  CN grossly intact  5/5 BUE/BLE  Incision: c/d/i  PLAN Doing well Remove drain Discharge home

## 2020-06-01 ENCOUNTER — Encounter (HOSPITAL_COMMUNITY): Payer: Self-pay | Admitting: Neurosurgery

## 2020-06-01 MED FILL — Thrombin For Soln 5000 Unit: CUTANEOUS | Qty: 5000 | Status: AC

## 2020-06-02 MED FILL — Sodium Chloride IV Soln 0.9%: INTRAVENOUS | Qty: 1000 | Status: AC

## 2020-06-02 MED FILL — Heparin Sodium (Porcine) Inj 1000 Unit/ML: INTRAMUSCULAR | Qty: 30 | Status: AC

## 2020-07-22 ENCOUNTER — Encounter: Payer: Self-pay | Admitting: Physician Assistant

## 2020-07-22 ENCOUNTER — Other Ambulatory Visit: Payer: Self-pay

## 2020-07-22 ENCOUNTER — Ambulatory Visit (INDEPENDENT_AMBULATORY_CARE_PROVIDER_SITE_OTHER): Payer: Medicare Other | Admitting: Physician Assistant

## 2020-07-22 VITALS — BP 120/68 | HR 64 | Temp 97.8°F | Ht 65.5 in | Wt 176.5 lb

## 2020-07-22 DIAGNOSIS — I10 Essential (primary) hypertension: Secondary | ICD-10-CM | POA: Diagnosis not present

## 2020-07-22 DIAGNOSIS — Z8639 Personal history of other endocrine, nutritional and metabolic disease: Secondary | ICD-10-CM | POA: Diagnosis not present

## 2020-07-22 DIAGNOSIS — E782 Mixed hyperlipidemia: Secondary | ICD-10-CM

## 2020-07-22 DIAGNOSIS — G4700 Insomnia, unspecified: Secondary | ICD-10-CM

## 2020-07-22 DIAGNOSIS — F3289 Other specified depressive episodes: Secondary | ICD-10-CM

## 2020-07-22 MED ORDER — TRAZODONE HCL 100 MG PO TABS: 200.0000 mg | ORAL_TABLET | Freq: Every day | ORAL | 3 refills | Status: DC

## 2020-07-22 NOTE — Progress Notes (Signed)
Vanessa Harmon is a 70 y.o. female is here to discuss several issues.  I acted as a Education administrator for [employer], PA-C Anselmo Pickler, LPN   History of Present Illness:   Chief Complaint  Patient presents with  . Establish Care  . Insomnia  . Hyperlipidemia    HPI   Patient is here to establish care. We reviewed her medications and health history extensively.  Main care team providers at this time: Vertell Limber - neurosurgeon [facility] - rheumatology Clista Bernhardt - allergist/asthma   Insomnia; Depression/Anxiety Currently taking Trazodone 150 mg at [facility] and is still having trouble falling asleep. Has been on this dose for a few months.  She is getting about 5 hours of sleep. She has racing, anxious thoughts at night that keep her awake. Has been on Lexapro 20 mg for at least a few years. She has had multiple orthopedic surgeries and is currently recovering from thoracic spinal surgery from Feb 2022. Feels like once she can get more active her sleep may improve. Enjoys showing dogs and has been missing out on this passion due to orthopedic issues.  She is on benadryl and gabapentin for chronic health issues, states that these are non-sedating. Rare alcohol intake. She does snore but does not feel as though she has OSA.    Hyperlipidemia Pt is currently not on any medications for cholesterol due 2/2 myalgias. Has only tried Lipitor and Crestor in the past. Would like blood work updated.   HTN Currently taking irbesartan 300 mg daily, lasix 20 mg daily, chlorthalidone 25 mg. Patient denies chest pain, SOB, blurred vision, dizziness, unusual headaches, lower leg swelling. Patient is compliant with medication. Denies excessive caffeine intake, stimulant usage, excessive alcohol intake, or increase in salt consumption.  BP Readings from Last 3 Encounters:  07/22/20 120/68  05/31/20 (!) 117/42  05/27/20 (!) 157/74    Insulin resistance Per chart review, most recent A1c was 5.5 in 2020.  Has been as high as 5.7%. She has never been on medications for DM or insulin resistance.     Health Maintenance Due  Topic Date Due  . Hepatitis C Screening  Never done  . MAMMOGRAM  04/27/2018  . COVID-19 Vaccine (3 - Booster for Pfizer series) 12/13/2019  . COLONOSCOPY (Pts 45-41yrs Insurance coverage will need to be confirmed)  03/08/2020    Past Medical History:  Diagnosis Date  . Adenomatous polyp   . Allergy   . Anxiety    takes Ativan daily as needed  . Arthritis    inflammatory arthritis (Dr. Dagoberto Ligas)  . Asthma   . Cataract   . Chronic back pain    scoliosis   . Constipation    takes miralax every other day  . Depression    takes Lexapro daily  . Eczema   . Essential hypertension, benign    takes Hyzaar and Metoprolol daily  . Family history of adverse reaction to anesthesia    Sister PONV  . Fibromyalgia   . GERD (gastroesophageal reflux disease)   . History of colon polyps   . History of shingles   . Hyperlipidemia    takes Atorvastatin daily  . Insomnia    takes Melatonin nightly  . Joint pain   . Osteoarthritis   . Osteoporosis   . PSVT (paroxysmal supraventricular tachycardia) ([place])   . Recurrent upper respiratory infection (URI)   . Rheumatoid arthritis ([place])    sees Dr. Amil Amen; stable on Orencia  . Rhinitis, allergic  uses Flonase daily  . Urinary urgency   . Urticaria      Social History   Tobacco Use  . Smoking status: Former Smoker    Quit date: 1998    Years since quitting: 24.2  . Smokeless tobacco: Never Used  . Tobacco comment: hasn't smoked in 19yrs  Vaping Use  . Vaping Use: Never used  Substance Use Topics  . Alcohol use: Yes    Alcohol/week: 1.0 standard drink    Types: 1 Glasses of wine per week    Comment: rarely with dinner  . Drug use: No    Past Surgical History:  Procedure Laterality Date  . ADENOIDECTOMY    . APPENDECTOMY    . BACK SURGERY  2012   at age 8 d/t scoliosis  . BLEPHAROPLASTY   2018  . BREAST BIOPSY    . cataract surgery    . COLONOSCOPY    . HARDWARE REMOVAL Left 02/24/2020   Procedure: HARDWARE REMOVAL LEFT DISTAL RADIUS;  Surgeon: Leanora Cover, MD;  Location: [place];  Service: Orthopedics;  Laterality: Left;  . KNEE ARTHROSCOPY Bilateral   . neuroma removed from foot     unsure of which foot  . OPEN REDUCTION INTERNAL FIXATION (ORIF) DISTAL RADIAL FRACTURE Left 08/15/2019   Procedure: OPEN REDUCTION INTERNAL FIXATION (ORIF) DISTAL RADIAL FRACTURE;  Surgeon: Leanora Cover, MD;  Location: [place];  Service: Orthopedics;  Laterality: Left;  block in preop  . POSTERIOR CERVICAL FUSION/FORAMINOTOMY N/A 05/29/2020   Procedure: Removal of hardware, Replacement of thoracic 8, thoracic 9, thoracic 10 screws, Right Thoracic 6-7, Right Thoracic 7-8 Laminectomies with thoracic hooks from Thoracic 4-5 to Thoracic 10;  Surgeon: Erline Levine, MD;  Location: [place];  Service: Neurosurgery;  Laterality: N/A;  . POSTERIOR LUMBAR FUSION 4 LEVEL N/A 02/15/2018   Procedure: Decompression and fusion Thoracic nine to Lumbar two with exploration of previous fusion;  Surgeon: Erline Levine, MD;  Location: [place];  Service: Neurosurgery;  Laterality: N/A;  . SHOULDER SURGERY Right   . TONSILLECTOMY    . TOTAL HIP ARTHROPLASTY Right 04/15/2014   Procedure: RIGHT TOTAL HIP ARTHROPLASTY ANTERIOR APPROACH;  Surgeon: Renette Butters, MD;  Location: [place];  Service: Orthopedics;  Laterality: Right;  . TOTAL KNEE ARTHROPLASTY Left 11/26/2019   Procedure: TOTAL KNEE ARTHROPLASTY;  Surgeon: Paralee Cancel, MD;  Location: [facility];  Service: Orthopedics;  Laterality: Left;  70 mins    Family History  Problem Relation Age of Onset  . Stroke Mother   . Allergic rhinitis Mother   . Allergic rhinitis Father   . AAA (abdominal aortic aneurysm) Father   . COPD Father   . Prostate cancer Father   . Hypertension Other        unspecified grandmother  . AAA (abdominal  aortic aneurysm) Other        unspecified grandfather  . Cancer Other        unspecified grandfather  . Neuropathy Neg Hx     PMHx, SurgHx, SocialHx, FamHx, Medications, and Allergies were reviewed in the Visit Navigator and updated as appropriate.   Patient Active Problem List   Diagnosis Date Noted  . Thoracic spondylosis with cord compression 05/29/2020  . Heterotopic ossification 03/30/2020  . Loosening of hardware in spine ([place]) 01/27/2020  . Posterior vitreous detachment of right eye 01/21/2020  . Pseudophakia 01/21/2020  . Horseshoe retinal tear of right eye 01/21/2020  . Body mass index (BMI) 28.0-28.9, adult 11/27/2019  .  Left knee OA 11/26/2019  . Numbness of foot 02/07/2019  . Rheumatoid arthritis ([place]) 01/25/2019  . Peripheral polyneuropathy 01/14/2019  . Spinal stenosis, thoracolumbar region 01/14/2019  . Thoracogenic scoliosis, thoracolumbar region 01/14/2019  . Idiopathic scoliosis of thoracolumbar region 02/15/2018  . Rhinitis, chronic 05/16/2017  . Moderate persistent asthma without complication 97/05/6376  . Polymyalgia rheumatica ([place]) 02/15/2015  . DJD (degenerative joint disease) 04/15/2014  . Lumbar spine scoliosis 11/28/2013  . Degenerative spondylolisthesis 10/09/2013  . Lumbar radiculopathy 09/16/2013  . Lumbosacral radiculitis 07/31/2013  . Scoliosis, or kyphoscoliosis, idiopathic 07/31/2013  . Lumbar back pain 12/19/2011  . Hyperlipidemia 10/16/2010  . Depression 10/16/2010  . Lumbar spondylosis 10/16/2010  . Gastroesophageal reflux disease 10/16/2010  . Adenomatous polyp of colon 10/16/2010  . History of paroxysmal supraventricular tachycardia 10/16/2010  . Essential (primary) hypertension 10/16/2010    Social History   Tobacco Use  . Smoking status: Former Smoker    Quit date: 1998    Years since quitting: 24.2  . Smokeless tobacco: Never Used  . Tobacco comment: hasn't smoked in 49yrs  Vaping Use  . Vaping Use: Never used  Substance  Use Topics  . Alcohol use: Yes    Alcohol/week: 1.0 standard drink    Types: 1 Glasses of wine per week    Comment: rarely with dinner  . Drug use: No    Current Medications and Allergies:    Current Outpatient Medications:  .  abatacept (ORENCIA) 250 MG injection, Inject 250 mg into the vein every 30 (thirty) days., Disp: , Rfl:  .  albuterol (PROAIR HFA) 108 (90 Base) MCG/ACT inhaler, Inhale two puffs every 4-6 hours if needed for cough or wheeze. (Patient taking differently: Inhale 2 puffs into the lungs every 4 (four) hours as needed for wheezing or shortness of breath. Inhale two puffs every 4-6 hours if needed for cough or wheeze.), Disp: 8.5 Inhaler, Rfl: 1 .  aspirin EC 81 MG tablet, Take 1 tablet (81 mg total) by mouth daily. Swallow whole., Disp: 30 tablet, Rfl: 11 .  azelastine (ASTELIN) 0.1 % nasal spray, Place 1 spray into both nostrils 2 (two) times daily. Use in each nostril as directed, Disp: , Rfl:  .  budesonide (PULMICORT) 0.5 MG/2ML nebulizer solution, Take by nebulization., Disp: , Rfl:  .  Calcium Carb-Cholecalciferol (CALCIUM 600/VITAMIN D3 PO), Take 1 tablet by mouth daily., Disp: , Rfl:  .  carboxymethylcellulose (REFRESH PLUS) 0.5 % SOLN, Place 1 drop into both eyes 3 (three) times daily as needed (dry eyes). , Disp: , Rfl:  .  chlorthalidone (HYGROTON) 25 MG tablet, Take 25 mg by mouth daily., Disp: , Rfl:  .  denosumab (PROLIA) 60 MG/ML SOSY injection, Inject 60 mg into the skin every 6 (six) months., Disp: , Rfl:  .  diphenhydrAMINE (BENADRYL) 25 MG tablet, Take 50 mg by mouth daily as needed for allergies., Disp: , Rfl:  .  EPINEPHrine 0.3 mg/0.3 mL IJ SOAJ injection, epinephrine 0.3 mg/0.3 mL injection, auto-injector, Disp: , Rfl:  .  escitalopram (LEXAPRO) 20 MG tablet, TAKE 1 TABLET BY MOUTH DAILY (Patient taking differently: Take 20 mg by mouth daily.), Disp: 90 tablet, Rfl: 3 .  famotidine (PEPCID) 40 MG tablet, TAKE 1 TABLET (40 MG TOTAL) BY MOUTH AT  BEDTIME., Disp: 30 tablet, Rfl: 5 .  fluticasone (FLOVENT HFA) 220 MCG/ACT inhaler, Inhale 2 puffs into the lungs 2 (two) times daily., Disp: , Rfl:  .  furosemide (LASIX) 20 MG tablet, TAKE 1 TABLET (  20 MG TOTAL) BY MOUTH DAILY., Disp: 90 tablet, Rfl: 1 .  gabapentin (NEURONTIN) 300 MG capsule, Take 300 mg by mouth at bedtime., Disp: , Rfl:  .  guaiFENesin (MUCINEX) 600 MG 12 hr tablet, Take 600 mg by mouth 2 (two) times daily as needed for cough or to loosen phlegm., Disp: , Rfl:  .  HYDROcodone-acetaminophen (NORCO) 7.5-325 MG tablet, Take 1 tablet by mouth every 6 (six) hours as needed for moderate pain., Disp: , Rfl:  .  indomethacin (INDOCIN SR) 75 MG CR capsule, Take 75 mg by mouth daily with breakfast., Disp: , Rfl:  .  ipratropium (ATROVENT) 0.03 % nasal spray, Place 1 spray into both nostrils 2 (two) times daily as needed for rhinitis., Disp: , Rfl:  .  irbesartan (AVAPRO) 300 MG tablet, Take 300 mg by mouth daily. , Disp: , Rfl:  .  Lifitegrast (XIIDRA) 5 % SOLN, Place 1 drop into both eyes in the morning and at bedtime. , Disp: , Rfl:  .  loratadine (CLARITIN) 10 MG tablet, Take 10 mg by mouth daily., Disp: , Rfl:  .  methocarbamol (ROBAXIN) 500 MG tablet, Take 1 tablet (500 mg total) by mouth every 6 (six) hours as needed for muscle spasms., Disp: 60 tablet, Rfl: 2 .  montelukast (SINGULAIR) 10 MG tablet, Take 1 tablet (10 mg total) by mouth at bedtime., Disp: 30 tablet, Rfl: 5 .  Multiple Vitamin (MULTIVITAMIN) capsule, Take 1 capsule by mouth daily., Disp: , Rfl:  .  Omega-3 Fatty Acids (FISH OIL PO), Take 1,000 mg by mouth daily., Disp: , Rfl:  .  omeprazole (PRILOSEC) 40 MG capsule, TAKE ONE CAPSULE BY MOUTH DAILY BEFORE BREAKFAST DAILY (Patient taking differently: Take 40 mg by mouth daily. TAKE ONE CAPSULE BY MOUTH DAILY BEFORE BREAKFAST DAILY), Disp: 30 capsule, Rfl: 4 .  OVER THE COUNTER MEDICATION, Place 1 application into both eyes at bedtime. Genteal overnight eye ointment,  Disp: , Rfl:  .  oxyCODONE (OXY IR/ROXICODONE) 5 MG immediate release tablet, take 1 tablet by oral route  every 4 hours as needed, Disp: , Rfl:  .  predniSONE (DELTASONE) 5 MG tablet, Take 5 mg by mouth daily with breakfast. , Disp: , Rfl:  .  PRESCRIPTION MEDICATION, Place 1 spray into the nose in the morning and at bedtime. Budesonide 0.5 mg / 2 mL Nasal rinse, Disp: , Rfl:  .  pseudoephedrine (SUDAFED) 30 MG tablet, Take 30 mg by mouth every 4 (four) hours as needed for congestion., Disp: , Rfl:  .  traZODone (DESYREL) 100 MG tablet, Take 2 tablets (200 mg total) by mouth at bedtime., Disp: 60 tablet, Rfl: 3 .  valACYclovir (VALTREX) 1000 MG tablet, Take 1,000 mg by mouth daily. , Disp: , Rfl:  .  vitamin C (ASCORBIC ACID) 500 MG tablet, Take 500 mg by mouth daily., Disp: , Rfl:    Allergies  Allergen Reactions  . Advair Hfa [Fluticasone-Salmeterol] Other (See Comments)    Hoarness  . Statins Other (See Comments)  . Breo Ellipta [Fluticasone Furoate-Vilanterol] Other (See Comments)    Severe hoarseness   . Fosamax [Alendronate Sodium] Other (See Comments)    myalgias  . Levaquin [Levofloxacin] Other (See Comments)    Lightheadedness, not feeling well, ear pain  . Doxycycline Hyclate   . Infliximab Other (See Comments)    Skin reaction  . Spiriva Handihaler [Tiotropium Bromide Monohydrate]     UNSPECIFIED REACTION   . Verapamil   . Tape Rash  PAPER TAPE: Causes severe rash and weeping    Review of Systems   ROS  Negative unless otherwise specified per HPI.  Vitals:   Vitals:   07/22/20 1414  BP: 120/68  Pulse: 64  Temp: 97.8 F (36.6 C)  TempSrc: Temporal  SpO2: 95%  Weight: 176 lb 8 oz (80.1 kg)  Height: 5' 5.5" (1.664 m)     Body mass index is 28.92 kg/m.   Physical Exam:    Physical Exam Vitals and nursing note reviewed.  Constitutional:      General: She is not in acute distress.    Appearance: She is well-developed. She is not ill-appearing or  toxic-appearing.  Cardiovascular:     Rate and Rhythm: Normal rate and regular rhythm.     Pulses: Normal pulses.     Heart sounds: Normal heart sounds, S1 normal and S2 normal.     Comments: No LE edema Pulmonary:     Effort: Pulmonary effort is normal.     Breath sounds: Normal breath sounds.  Skin:    General: Skin is warm and dry.  Neurological:     Mental Status: She is alert.     GCS: GCS eye subscore is 4. GCS verbal subscore is 5. GCS motor subscore is 6.  Psychiatric:        Speech: Speech normal.        Behavior: Behavior normal. Behavior is cooperative.      Assessment and Plan:    Vanessa Harmon was seen today for establish care, insomnia and hyperlipidemia.  Diagnoses and all orders for this visit:  Mixed hyperlipidemia Update lipid panel today. If indicated, we will trial a lower potency statin vs sent to lipid clinic. -     Lipid panel; Future  Primary hypertension Currently well controlled. Update CMP. Maintain rbesartan 300 mg daily, lasix 20 mg daily, chlorthalidone 25 mg, adjust prn and based on labwork results. -     CBC with Differential/Platelet; Future -     Comprehensive metabolic panel; Future  History of insulin resistance Update A1c and provide further recommendations as indicated. -     Hemoglobin A1c; Future  Other depression Stable. Continue lexapro 20 mg daily. F/u in 6 months.  Insomnia, unspecified type Uncontrolled. Will trial increase of trazodone to 200 mg. I told her that I don't feel comfortable increasing beyond this. If unsuccessful, will consider other medication after reduction of trazodone. I'm hopeful that as she becomes more active her sleep will improve.  Other orders -     traZODone (DESYREL) 100 MG tablet; Take 2 tablets (200 mg total) by mouth at bedtime.  CMA or LPN served as scribe during this visit. History, Physical, and Plan performed by medical provider. The above documentation has been reviewed and is accurate and  complete.  Time spent with patient today was 45 minutes which consisted of chart review, discussing diagnosis, work up, treatment answering questions and documentation.  Inda Coke, PA-C , [place] 07/22/2020  Follow-up: No follow-ups on file.

## 2020-07-22 NOTE — Patient Instructions (Signed)
It was great to see you!  Increase trazodone to 200 mg -- I have sent this in.  Consider buspar for anxiety for our next option.  Please make an appointment with the lab on your way out. I would like for you to return for lab work within 1-2 weeks. After midnight on the day of the lab draw, please do not eat anything. You may have water, black coffee, unsweetened tea.  Let's follow-up in 6 months, sooner if you have concerns.  Take care,  Inda Coke PA-C

## 2020-07-29 ENCOUNTER — Other Ambulatory Visit: Payer: Self-pay | Admitting: *Deleted

## 2020-07-29 ENCOUNTER — Other Ambulatory Visit: Payer: Self-pay

## 2020-07-29 ENCOUNTER — Other Ambulatory Visit: Payer: Self-pay | Admitting: Physician Assistant

## 2020-07-29 ENCOUNTER — Other Ambulatory Visit (INDEPENDENT_AMBULATORY_CARE_PROVIDER_SITE_OTHER): Payer: Medicare Other

## 2020-07-29 DIAGNOSIS — I10 Essential (primary) hypertension: Secondary | ICD-10-CM

## 2020-07-29 DIAGNOSIS — Z8639 Personal history of other endocrine, nutritional and metabolic disease: Secondary | ICD-10-CM | POA: Diagnosis not present

## 2020-07-29 DIAGNOSIS — E782 Mixed hyperlipidemia: Secondary | ICD-10-CM | POA: Diagnosis not present

## 2020-07-29 DIAGNOSIS — E785 Hyperlipidemia, unspecified: Secondary | ICD-10-CM

## 2020-07-29 DIAGNOSIS — E871 Hypo-osmolality and hyponatremia: Secondary | ICD-10-CM

## 2020-07-29 LAB — COMPREHENSIVE METABOLIC PANEL
ALT: 14 U/L (ref 0–35)
AST: 13 U/L (ref 0–37)
Albumin: 4.2 g/dL (ref 3.5–5.2)
Alkaline Phosphatase: 70 U/L (ref 39–117)
BUN: 17 mg/dL (ref 6–23)
CO2: 31 mEq/L (ref 19–32)
Calcium: 9.9 mg/dL (ref 8.4–10.5)
Chloride: 85 mEq/L — ABNORMAL LOW (ref 96–112)
Creatinine, Ser: 0.91 mg/dL (ref 0.40–1.20)
GFR: 63.6 mL/min (ref >60.00)
Glucose, Bld: 106 mg/dL — ABNORMAL HIGH (ref 70–99)
Potassium: 3.9 mEq/L (ref 3.5–5.1)
Sodium: 124 mEq/L — ABNORMAL LOW (ref 135–145)
Total Bilirubin: 0.5 mg/dL (ref 0.2–1.2)
Total Protein: 6.8 g/dL (ref 6.0–8.3)

## 2020-07-29 LAB — CBC WITH DIFFERENTIAL/PLATELET
Basophils Absolute: 0.1 10*3/uL (ref 0.0–0.1)
Basophils Relative: 0.6 % (ref 0.0–3.0)
Eosinophils Absolute: 0 10*3/uL (ref 0.0–0.7)
Eosinophils Relative: 0.2 % (ref 0.0–5.0)
HCT: 36.2 % (ref 36.0–46.0)
Hemoglobin: 12.3 g/dL (ref 12.0–15.0)
Lymphocytes Relative: 11.5 % — ABNORMAL LOW (ref 12.0–46.0)
Lymphs Abs: 1 10*3/uL (ref 0.7–4.0)
MCHC: 33.8 g/dL (ref 30.0–36.0)
MCV: 92.8 fl (ref 78.0–100.0)
Monocytes Absolute: 0.8 10*3/uL (ref 0.1–1.0)
Monocytes Relative: 8.6 % (ref 3.0–12.0)
Neutro Abs: 7.1 10*3/uL (ref 1.4–7.7)
Neutrophils Relative %: 79.1 % — ABNORMAL HIGH (ref 43.0–77.0)
Platelets: 363 10*3/uL (ref 150.0–400.0)
RBC: 3.9 Mil/uL (ref 3.87–5.11)
RDW: 14.9 % (ref 11.5–15.5)
WBC: 8.9 10*3/uL (ref 4.0–10.5)

## 2020-07-29 LAB — HEMOGLOBIN A1C: Hgb A1c MFr Bld: 5.8 % (ref 4.6–6.5)

## 2020-07-29 LAB — LIPID PANEL
Cholesterol: 305 mg/dL — ABNORMAL HIGH (ref 0–200)
HDL: 52.5 mg/dL (ref >39.00)
LDL Cholesterol: 223 mg/dL — ABNORMAL HIGH (ref 0–99)
NonHDL: 252.85
Total CHOL/HDL Ratio: 6
Triglycerides: 149 mg/dL (ref 0.0–149.0)
VLDL: 29.8 mg/dL (ref 0.0–40.0)

## 2020-08-03 ENCOUNTER — Other Ambulatory Visit (INDEPENDENT_AMBULATORY_CARE_PROVIDER_SITE_OTHER): Payer: Medicare Other

## 2020-08-03 ENCOUNTER — Other Ambulatory Visit: Payer: Self-pay | Admitting: *Deleted

## 2020-08-03 ENCOUNTER — Other Ambulatory Visit: Payer: Self-pay

## 2020-08-03 DIAGNOSIS — E871 Hypo-osmolality and hyponatremia: Secondary | ICD-10-CM

## 2020-08-03 LAB — BASIC METABOLIC PANEL
BUN: 15 mg/dL (ref 6–23)
CO2: 30 mEq/L (ref 19–32)
Calcium: 9.3 mg/dL (ref 8.4–10.5)
Chloride: 92 mEq/L — ABNORMAL LOW (ref 96–112)
Creatinine, Ser: 0.88 mg/dL (ref 0.40–1.20)
GFR: 66.2 mL/min (ref >60.00)
Glucose, Bld: 111 mg/dL — ABNORMAL HIGH (ref 70–99)
Potassium: 4.4 mEq/L (ref 3.5–5.1)
Sodium: 127 mEq/L — ABNORMAL LOW (ref 135–145)

## 2020-08-03 MED ORDER — FUROSEMIDE 20 MG PO TABS: 20.0000 mg | ORAL_TABLET | Freq: Every day | ORAL | 0 refills | Status: DC

## 2020-08-03 NOTE — Telephone Encounter (Signed)
Spoke to pt told her sent Rx for Lasix to pharmacy and that I had not received any request please make sure pharmacy has [facility] as your PCP. Pt verbalized understanding. Rx sent

## 2020-08-04 ENCOUNTER — Other Ambulatory Visit: Payer: Self-pay | Admitting: *Deleted

## 2020-08-04 ENCOUNTER — Other Ambulatory Visit: Payer: Self-pay | Admitting: Physician Assistant

## 2020-08-04 DIAGNOSIS — E871 Hypo-osmolality and hyponatremia: Secondary | ICD-10-CM

## 2020-08-04 MED ORDER — ESCITALOPRAM OXALATE 10 MG PO TABS: 10.0000 mg | ORAL_TABLET | Freq: Every day | ORAL | 0 refills | Status: DC

## 2020-08-06 ENCOUNTER — Telehealth: Payer: Self-pay

## 2020-08-06 NOTE — Telephone Encounter (Signed)
[facility] has a question regarding patients medication, requesting a call back.

## 2020-08-06 NOTE — Telephone Encounter (Signed)
Called [facility] and spoke to [facility], she said she sent a fax over regarding pt to stop one of her medications per Dr. Jannett Celestine before her visit. The medication is Chlorthalidone 25 mg. Told her okay will look for fax and make sure Aldona Bar is notified tomorrow when back in office. Dawn verbalized understanding.

## 2020-08-06 NOTE — Telephone Encounter (Signed)
Called Dawn back told her pt was told to stop Chlorthalidone 25 mg on 4/13 due to abnormal labs. Dawn verbalized understanding and will make note in patients chart. They will be contacting her to schedule and appt.

## 2020-08-07 ENCOUNTER — Encounter: Payer: Self-pay | Admitting: Physician Assistant

## 2020-08-07 NOTE — Telephone Encounter (Signed)
FYI, we did not receive fax.

## 2020-08-10 NOTE — Telephone Encounter (Signed)
Appointment was cancelled.

## 2020-08-11 ENCOUNTER — Telehealth: Payer: Medicare Other | Admitting: Physician Assistant

## 2020-08-17 ENCOUNTER — Encounter: Payer: Self-pay | Admitting: Physician Assistant

## 2020-08-19 LAB — COMPREHENSIVE METABOLIC PANEL
Albumin: 4.4 (ref 3.5–5.0)
Calcium: 9.8 (ref 8.7–10.7)
GFR calc non Af Amer: 62

## 2020-08-19 LAB — TSH: TSH: 0.75 (ref 0.41–5.90)

## 2020-08-19 LAB — BASIC METABOLIC PANEL
BUN: 22 — AB (ref 4–21)
CO2: 27 — AB (ref 13–22)
Chloride: 100 (ref 99–108)
Creatinine: 1 (ref 0.5–1.1)
Glucose: 122
Potassium: 5.1 (ref 3.4–5.3)
Sodium: 136 — AB (ref 137–147)

## 2020-08-20 ENCOUNTER — Other Ambulatory Visit: Payer: Self-pay | Admitting: Physician Assistant

## 2020-08-26 ENCOUNTER — Other Ambulatory Visit (INDEPENDENT_AMBULATORY_CARE_PROVIDER_SITE_OTHER): Payer: Medicare Other

## 2020-08-26 ENCOUNTER — Other Ambulatory Visit: Payer: Self-pay

## 2020-08-26 DIAGNOSIS — E871 Hypo-osmolality and hyponatremia: Secondary | ICD-10-CM

## 2020-08-26 LAB — BASIC METABOLIC PANEL
BUN: 13 mg/dL (ref 6–23)
CO2: 28 mEq/L (ref 19–32)
Calcium: 9.5 mg/dL (ref 8.4–10.5)
Chloride: 97 mEq/L (ref 96–112)
Creatinine, Ser: 0.89 mg/dL (ref 0.40–1.20)
GFR: 65.28 mL/min (ref >60.00)
Glucose, Bld: 145 mg/dL — ABNORMAL HIGH (ref 70–99)
Potassium: 4 mEq/L (ref 3.5–5.1)
Sodium: 132 mEq/L — ABNORMAL LOW (ref 135–145)

## 2020-08-27 NOTE — Progress Notes (Signed)
Please inform patient of the following:  Her sodium is low but near her baseline.  This is likely due to medication side effect.  Would not recommend making any changes at this point as her numbers are improving.  We should recheck again in a few months.

## 2020-09-01 ENCOUNTER — Encounter (INDEPENDENT_AMBULATORY_CARE_PROVIDER_SITE_OTHER): Payer: Medicare Other | Admitting: Ophthalmology

## 2020-09-04 ENCOUNTER — Other Ambulatory Visit: Payer: Self-pay

## 2020-09-04 ENCOUNTER — Ambulatory Visit (INDEPENDENT_AMBULATORY_CARE_PROVIDER_SITE_OTHER): Payer: Medicare Other | Admitting: Family Medicine

## 2020-09-04 ENCOUNTER — Encounter: Payer: Self-pay | Admitting: Family Medicine

## 2020-09-04 VITALS — BP 142/77 | HR 66 | Temp 98.1°F | Ht 65.5 in | Wt 177.4 lb

## 2020-09-04 DIAGNOSIS — E782 Mixed hyperlipidemia: Secondary | ICD-10-CM | POA: Diagnosis not present

## 2020-09-04 DIAGNOSIS — I10 Essential (primary) hypertension: Secondary | ICD-10-CM | POA: Diagnosis not present

## 2020-09-04 LAB — T3, FREE: T3, Free: 2.9 pg/mL (ref 2.3–4.2)

## 2020-09-04 LAB — TSH: TSH: 0.77 u[IU]/mL (ref 0.35–4.50)

## 2020-09-04 LAB — T4, FREE: Free T4: 0.85 ng/dL (ref 0.60–1.60)

## 2020-09-04 MED ORDER — EZETIMIBE 10 MG PO TABS: 10.0000 mg | ORAL_TABLET | Freq: Every day | ORAL | 5 refills | Status: DC

## 2020-09-04 NOTE — Progress Notes (Signed)
   Vanessa Harmon is a 70 y.o. female who presents today for an office visit.  Assessment/Plan:  New/Acute Problems: Easy Brusing  Likely secondary to aspirin use.  Patient is worried about thyroid.  We will recheck TSH today.  Also check free T4 and free T3.  Chronic Problems Addressed Today: Essential (primary) hypertension Recently stopped chlorthalidone due to hyponatremia.  Blood pressure is slightly above goal today.  She is also having some lower extremity edema that is worsened since stopping chlorthalidone.  We will continue her current treatment plan and she will follow-up with nephrology in a couple weeks.  Hyperlipidemia Has not been tolerant of statins.  She will be following up with lipid clinic in a few months.  She would like to start something before then.  We will start Zetia.     Subjective:  HPI:  See a/p.         Objective:  Physical Exam: BP (!) 142/77   Pulse 66   Temp 98.1 F (36.7 C) (Temporal)   Ht 5' 5.5" (1.664 m)   Wt 177 lb 6.4 oz (80.5 kg)   SpO2 98%   BMI 29.07 kg/m   Gen: No acute distress, resting comfortably Neuro: Grossly normal, moves all extremities Psych: Normal affect and thought content      Alyra Patty M. Jerline Pain, MD 09/04/2020 2:53 PM

## 2020-09-04 NOTE — Assessment & Plan Note (Signed)
Has not been tolerant of statins.  She will be following up with lipid clinic in a few months.  She would like to start something before then.  We will start Zetia.

## 2020-09-04 NOTE — Assessment & Plan Note (Signed)
Recently stopped chlorthalidone due to hyponatremia.  Blood pressure is slightly above goal today.  She is also having some lower extremity edema that is worsened since stopping chlorthalidone.  We will continue her current treatment plan and she will follow-up with nephrology in a couple weeks.

## 2020-09-04 NOTE — Patient Instructions (Signed)
It was very nice to see you today!  We will check blood work.  We start the Zetia to help with your cholesterol levels.  Your blood pressure is just a little bit elevated today but stable.  Please discuss with your nephrologist about additional blood pressure medications.  Take care, Dr Jerline Pain  PLEASE NOTE:  If you had any lab tests please let us know if you have not heard back within a few days. You may see your results on mychart before we have a chance to review them but we will give you a call once they are reviewed by [place]. If we ordered any referrals today, please let us know if you have not heard from their office within the next week.   Please try these tips to maintain a healthy lifestyle:   Eat at least 3 REAL meals and 1-2 snacks per day.  Aim for no more than 5 hours between eating.  If you eat breakfast, please do so within one hour of getting up.    Each meal should contain half fruits/vegetables, one quarter protein, and one quarter carbs (no bigger than a computer mouse)   Cut down on sweet beverages. This includes juice, soda, and sweet tea.     Drink at least 1 glass of water with each meal and aim for at least 8 glasses per day   Exercise at least 150 minutes every week.

## 2020-09-07 NOTE — Progress Notes (Signed)
Please inform patient of the following:  Her thyroid levels are all NORMAL.  Algis Greenhouse. Jerline Pain, MD 09/07/2020 8:58 AM

## 2020-09-09 ENCOUNTER — Encounter: Payer: Self-pay | Admitting: Physician Assistant

## 2020-09-21 ENCOUNTER — Encounter (INDEPENDENT_AMBULATORY_CARE_PROVIDER_SITE_OTHER): Payer: Medicare Other | Admitting: Ophthalmology

## 2020-09-27 ENCOUNTER — Encounter: Payer: Self-pay | Admitting: Family Medicine

## 2020-09-29 ENCOUNTER — Encounter (INDEPENDENT_AMBULATORY_CARE_PROVIDER_SITE_OTHER): Payer: Self-pay | Admitting: Ophthalmology

## 2020-09-29 ENCOUNTER — Other Ambulatory Visit: Payer: Self-pay

## 2020-09-29 ENCOUNTER — Ambulatory Visit (INDEPENDENT_AMBULATORY_CARE_PROVIDER_SITE_OTHER): Payer: Medicare Other | Admitting: Ophthalmology

## 2020-09-29 DIAGNOSIS — H43811 Vitreous degeneration, right eye: Secondary | ICD-10-CM

## 2020-09-29 DIAGNOSIS — H43812 Vitreous degeneration, left eye: Secondary | ICD-10-CM

## 2020-09-29 DIAGNOSIS — H33311 Horseshoe tear of retina without detachment, right eye: Secondary | ICD-10-CM | POA: Diagnosis not present

## 2020-09-29 NOTE — Assessment & Plan Note (Signed)
Stable, no retinal breaks

## 2020-09-29 NOTE — Patient Instructions (Signed)
Vitreous Detachment  Vitreous detachment is part of the normal aging process in the eyes. Vitreous is the jelly-like substance that makes up most of the inside of the eyeballs. It helps the eyeballs keep a round shape. The vitreous is attached to theretina of the eye with a series of fibers. As you age, the vitreous gradually shrinks. Tension increases between the fibers and the retina. Eventually, the fibers can break free from the retina, causing vitreous detachment. In most cases, this does not cause problems and does not require treatment. However, it can sometimes cause the retina to separate from the eyeball (retinal detachment), which requires treatment to prevent vision loss. What are the causes? Aging is the main cause of vitreous detachment. Everyone's vitreous naturallyshrinks with age. What increases the risk? You are more likely to have vitreous detachment if you: Are at least 71 years old. Have inflammation of the eye. Have an eye injury. Have had eye surgery. Have a hemorrhage in your eye. Are very nearsighted (myopia). Have diabetes. What are the signs or symptoms? Most people with this condition will not notice any symptoms. If symptoms do occur, the most common are floaters. Floaters occur as the vitreous begins to shrink. They may: Appear as tiny dots or webs in your vision. Seem to disappear when you look at them directly. Appear more often as your condition gets worse. Other symptoms include: Flashes of light (photopsia) in your peripheral vision that may look like lightning streaks. Decreased vision or a dark curtain or shadow moving across your field of vision. This is rare. How is this diagnosed? This condition may be diagnosed based on: Your signs and symptoms. An exam by a health care provider who specializes in conditions and diseases of the eye (ophthalmologist). The exam may include: Putting eye drops in your eye to make the pupil wider (dilated). The pupil is  the opening in the center of the eye. Checking the pupils with a magnifying glass. This exam is the best way to determine the type and extent of damage to your eye. How is this treated? For most people, a vitreous detachment is harmless, causing no symptoms or vision loss, and does not require treatment. Floaters usually become lessnoticeable over time. If the condition causes retinal detachment, you may need eye surgery to reattach your retina (reattachment surgery) in order to prevent vision loss or restore your vision. Follow these instructions at home: Keep all follow-up visits as told by your health care provider. This is important. Get help right away if: You develop signs of retinal detachment. These include: A sudden increase in the number of floaters you see. An increase in the number of flashes of light you see in your peripheral vision. Decreased vision. Summary Vitreous detachment is part of the normal aging process in the eyes. Vitreous is the jelly-like substance inside the eyeballs. As you age, the vitreous shrinks, and the fibers that attach the vitreous to the retina can break free, causing vitreous detachment. In most cases, vitreous detachment does not cause symptoms and does not require treatment. The most common symptom that can occur is seeing floaters that appear as tiny dots or webs in your vision. Vitreous detachment can sometimes cause the retina to separate from the eyeball (retinal detachment). This must be treated to prevent vision loss. This information is not intended to replace advice given to you by your health care provider. Make sure you discuss any questions you have with your healthcare provider. Document Revised: 12/05/2019 Document  Reviewed: 12/05/2019 Elsevier Patient Education  2022 [employer].

## 2020-09-29 NOTE — Assessment & Plan Note (Signed)
History of horseshoe retinal tear inferotemporal right eye, good retinopexy, no new breaks

## 2020-09-29 NOTE — Progress Notes (Signed)
09/29/2020     CHIEF COMPLAINT Patient presents for Retina Follow Up (6 month fu OU and fp/Pt states VA OU stable since last visit. Pt denies FOL, floaters, or ocular pain OU. /Pt reports using Xiidra BID OU/)   HISTORY OF PRESENT ILLNESS: Vanessa Harmon is a 70 y.o. female who presents to the clinic today for:   HPI     Retina Follow Up           Diagnosis: Other   Laterality: both eyes   Onset: 6 months ago   Severity: mild   Duration: 6 months   Course: stable   Comments: 6 month fu OU and fp Pt states VA OU stable since last visit. Pt denies FOL, floaters, or ocular pain OU.  Pt reports using Xiidra BID OU        Last edited by Kendra Opitz, [place] on 09/29/2020  2:49 PM.      Referring physician: Juanell Fairly, MD [address] [place],  [place] [address]  HISTORICAL INFORMATION:   Selected notes from the MEDICAL RECORD NUMBER    Lab Results  Component Value Date   HGBA1C 5.8 07/29/2020     CURRENT MEDICATIONS: Current Outpatient Medications (Ophthalmic Drugs)  Medication Sig   carboxymethylcellulose (REFRESH PLUS) 0.5 % SOLN Place 1 drop into both eyes 3 (three) times daily as needed (dry eyes).    Lifitegrast (XIIDRA) 5 % SOLN Place 1 drop into both eyes in the morning and at bedtime.    No current facility-administered medications for this visit. (Ophthalmic Drugs)   Current Outpatient Medications (Other)  Medication Sig   abatacept (ORENCIA) 250 MG injection Inject 250 mg into the vein every 30 (thirty) days.   albuterol (PROAIR HFA) 108 (90 Base) MCG/ACT inhaler Inhale two puffs every 4-6 hours if needed for cough or wheeze. (Patient taking differently: Inhale 2 puffs into the lungs every 4 (four) hours as needed for wheezing or shortness of breath. Inhale two puffs every 4-6 hours if needed for cough or wheeze.)   aspirin EC 81 MG tablet Take 1 tablet (81 mg total) by mouth daily. Swallow whole.   azelastine (ASTELIN) 0.1 % nasal spray  Place 1 spray into both nostrils 2 (two) times daily. Use in each nostril as directed   Calcium Carb-Cholecalciferol (CALCIUM 600/VITAMIN D3 PO) Take 1 tablet by mouth daily.   denosumab (PROLIA) 60 MG/ML SOSY injection Inject 60 mg into the skin every 6 (six) months.   diphenhydrAMINE (BENADRYL) 25 MG tablet Take 50 mg by mouth daily as needed for allergies.   EPINEPHrine 0.3 mg/0.3 mL IJ SOAJ injection epinephrine 0.3 mg/0.3 mL injection, auto-injector   escitalopram (LEXAPRO) 10 MG tablet Take 1 tablet (10 mg total) by mouth daily.   ezetimibe (ZETIA) 10 MG tablet Take 1 tablet (10 mg total) by mouth daily.   famotidine (PEPCID) 40 MG tablet TAKE 1 TABLET (40 MG TOTAL) BY MOUTH AT BEDTIME.   fluticasone (FLOVENT HFA) 220 MCG/ACT inhaler Inhale 2 puffs into the lungs 2 (two) times daily.   furosemide (LASIX) 20 MG tablet Take 1 tablet (20 mg total) by mouth daily.   gabapentin (NEURONTIN) 300 MG capsule Take 300 mg by mouth at bedtime.   guaiFENesin (MUCINEX) 600 MG 12 hr tablet Take 600 mg by mouth 2 (two) times daily as needed for cough or to loosen phlegm.   indomethacin (INDOCIN SR) 75 MG CR capsule Take 75 mg by mouth daily with  breakfast.   ipratropium (ATROVENT) 0.03 % nasal spray Place 1 spray into both nostrils 2 (two) times daily as needed for rhinitis.   irbesartan (AVAPRO) 300 MG tablet Take 300 mg by mouth daily.    loratadine (CLARITIN) 10 MG tablet Take 10 mg by mouth daily.   methocarbamol (ROBAXIN) 500 MG tablet Take 1 tablet (500 mg total) by mouth every 6 (six) hours as needed for muscle spasms.   montelukast (SINGULAIR) 10 MG tablet Take 1 tablet (10 mg total) by mouth at bedtime.   Multiple Vitamin (MULTIVITAMIN) capsule Take 1 capsule by mouth daily.   Omega-3 Fatty Acids (FISH OIL PO) Take 1,000 mg by mouth daily.   omeprazole (PRILOSEC) 40 MG capsule TAKE ONE CAPSULE BY MOUTH DAILY BEFORE BREAKFAST DAILY (Patient taking differently: Take 40 mg by mouth daily. TAKE ONE  CAPSULE BY MOUTH DAILY BEFORE BREAKFAST DAILY)   OVER THE COUNTER MEDICATION Place 1 application into both eyes at bedtime. Genteal overnight eye ointment   oxyCODONE (OXY IR/ROXICODONE) 5 MG immediate release tablet take 1 tablet by oral route  every 4 hours as needed   predniSONE (DELTASONE) 5 MG tablet Take 5 mg by mouth daily with breakfast.    PRESCRIPTION MEDICATION Place 1 spray into the nose in the morning and at bedtime. Budesonide 0.5 mg / 2 mL Nasal rinse   pseudoephedrine (SUDAFED) 30 MG tablet Take 30 mg by mouth every 4 (four) hours as needed for congestion.   traZODone (DESYREL) 150 MG tablet Take 1 tablet by mouth daily.   valACYclovir (VALTREX) 1000 MG tablet Take 1,000 mg by mouth daily.    vitamin C (ASCORBIC ACID) 500 MG tablet Take 500 mg by mouth daily.   No current facility-administered medications for this visit. (Other)      REVIEW OF SYSTEMS:    ALLERGIES Allergies  Allergen Reactions   Advair Hfa [Fluticasone-Salmeterol] Other (See Comments)    Hoarness   Statins Other (See Comments)   Breo Ellipta [Fluticasone Furoate-Vilanterol] Other (See Comments)    Severe hoarseness    Fosamax [Alendronate Sodium] Other (See Comments)    myalgias   Levaquin [Levofloxacin] Other (See Comments)    Lightheadedness, not feeling well, ear pain   Doxycycline Hyclate    Infliximab Other (See Comments)    Skin reaction   Spiriva Handihaler [Tiotropium Bromide Monohydrate]     UNSPECIFIED REACTION    Verapamil    Tape Rash    PAPER TAPE: Causes severe rash and weeping    PAST MEDICAL HISTORY Past Medical History:  Diagnosis Date   Adenomatous polyp    Allergy    Anxiety    takes Ativan daily as needed   Arthritis    inflammatory arthritis (Dr. Dagoberto Ligas)   Asthma    Cataract    Chronic back pain    scoliosis    Constipation    takes miralax every other day   Depression    takes Lexapro daily   Eczema    Essential hypertension, benign    takes  Hyzaar and Metoprolol daily   Family history of adverse reaction to anesthesia    Sister PONV   Fibromyalgia    GERD (gastroesophageal reflux disease)    History of colon polyps    History of shingles    Hyperlipidemia    takes Atorvastatin daily   Insomnia    takes Melatonin nightly   Joint pain    Osteoarthritis    Osteoporosis  PSVT (paroxysmal supraventricular tachycardia) (HCC)    Recurrent upper respiratory infection (URI)    Rheumatoid arthritis ([place])    sees Dr. Amil Amen; stable on Orencia   Rhinitis, allergic    uses Flonase daily   Urinary urgency    Urticaria    Past Surgical History:  Procedure Laterality Date   ADENOIDECTOMY     APPENDECTOMY     BACK SURGERY  2012   at age 42 d/t scoliosis   BLEPHAROPLASTY  2018   BREAST BIOPSY     cataract surgery     COLONOSCOPY     HARDWARE REMOVAL Left 02/24/2020   Procedure: HARDWARE REMOVAL LEFT DISTAL RADIUS;  Surgeon: Leanora Cover, MD;  Location: [facility];  Service: Orthopedics;  Laterality: Left;   KNEE ARTHROSCOPY Bilateral    neuroma removed from foot     unsure of which foot   OPEN REDUCTION INTERNAL FIXATION (ORIF) DISTAL RADIAL FRACTURE Left 08/15/2019   Procedure: OPEN REDUCTION INTERNAL FIXATION (ORIF) DISTAL RADIAL FRACTURE;  Surgeon: Leanora Cover, MD;  Location: [facility];  Service: Orthopedics;  Laterality: Left;  block in preop   POSTERIOR CERVICAL FUSION/FORAMINOTOMY N/A 05/29/2020   Procedure: Removal of hardware, Replacement of thoracic 8, thoracic 9, thoracic 10 screws, Right Thoracic 6-7, Right Thoracic 7-8 Laminectomies with thoracic hooks from Thoracic 4-5 to Thoracic 10;  Surgeon: Erline Levine, MD;  Location: [place] [facility];  Service: Neurosurgery;  Laterality: N/A;   POSTERIOR LUMBAR FUSION 4 LEVEL N/A 02/15/2018   Procedure: Decompression and fusion Thoracic nine to Lumbar two with exploration of previous fusion;  Surgeon: Erline Levine, MD;  Location: [facility];  Service:  Neurosurgery;  Laterality: N/A;   SHOULDER SURGERY Right    TONSILLECTOMY     TOTAL HIP ARTHROPLASTY Right 04/15/2014   Procedure: RIGHT TOTAL HIP ARTHROPLASTY ANTERIOR APPROACH;  Surgeon: Renette Butters, MD;  Location: [facility];  Service: Orthopedics;  Laterality: Right;   TOTAL KNEE ARTHROPLASTY Left 11/26/2019   Procedure: TOTAL KNEE ARTHROPLASTY;  Surgeon: Paralee Cancel, MD;  Location: [facility];  Service: Orthopedics;  Laterality: Left;  70 mins    FAMILY HISTORY Family History  Problem Relation Age of Onset   Stroke Mother    Allergic rhinitis Mother    Allergic rhinitis Father    AAA (abdominal aortic aneurysm) Father    COPD Father    Prostate cancer Father    Hypertension Other        unspecified grandmother   AAA (abdominal aortic aneurysm) Other        unspecified grandfather   Cancer Other        unspecified grandfather   Neuropathy Neg Hx     SOCIAL HISTORY Social History   Tobacco Use   Smoking status: Former    Pack years: 0.00    Types: Cigarettes    Quit date: 1998    Years since quitting: 24.4   Smokeless tobacco: Never   Tobacco comments:    hasn't smoked in 47yrs  Vaping Use   Vaping Use: Never used  Substance Use Topics   Alcohol use: Yes    Alcohol/week: 1.0 standard drink    Types: 1 Glasses of wine per week    Comment: rarely with dinner   Drug use: No         OPHTHALMIC EXAM:  Base Eye Exam     Visual Acuity (ETDRS)       Right Left   Dist cc 20/30 20/25 -2  Dist ph cc NI     Correction: Glasses         Tonometry (Tonopen, 2:51 PM)       Right Left   Pressure 09 10         Pupils       Pupils Dark Light Shape React APD   Right PERRL 4 3 Round Brisk None   Left PERRL 4 3 Round Brisk None         Visual Fields (Counting fingers)       Left Right    Full Full         Extraocular Movement       Right Left    Full Full         Neuro/Psych     Oriented x3: Yes   Mood/Affect: Normal          Dilation     Both eyes: 1.0% Mydriacyl, 2.5% Phenylephrine @ 2:51 PM           Slit Lamp and Fundus Exam     External Exam       Right Left   External Normal Normal         Slit Lamp Exam       Right Left   Lids/Lashes Normal Normal   Conjunctiva/Sclera White and quiet White and quiet   Cornea Clear Clear   Anterior Chamber Deep and quiet Deep and quiet   Iris Round and reactive Round and reactive   Lens Centered posterior chamber intraocular lens Centered posterior chamber intraocular lens   Anterior Vitreous Clear Normal         Fundus Exam       Right Left   Posterior Vitreous Posterior vitreous detachment Posterior vitreous detachment   Disc Normal Normal   C/D Ratio 0.6 0.75   Macula Normal Normal   Vessels Normal Normal   Periphery No new holes or tears good retinopexy inferotemporal 20 and 20 5D exam No new holes or tears good retinopexy inferotemporal 20-20 5D exam            IMAGING AND PROCEDURES  Imaging and Procedures for 09/29/20  Color Fundus Photography Optos - OU - Both Eyes       Right Eye Progression has been stable. Disc findings include increased cup to disc ratio. Macula : normal observations. Vessels : normal observations. Periphery : tear, RPE abnormality.   Left Eye Progression has been stable. Disc findings include increased cup to disc ratio. Macula : normal observations. Vessels : normal observations. Periphery : normal observations.   Notes Good retinopexy inferotemporal OD, no new breaks  Floater inferiorly OS no retinal breaks             ASSESSMENT/PLAN:  Horseshoe retinal tear of right eye History of horseshoe retinal tear inferotemporal right eye, good retinopexy, no new breaks  Posterior vitreous detachment of right eye Stable OD      ICD-10-CM   1. Horseshoe retinal tear of right eye  H33.311 Color Fundus Photography Optos - OU - Both Eyes    2. Posterior vitreous detachment of right eye  H43.811      3. Posterior vitreous detachment of left eye  H43.812       1.  History of retinal tear status post retinopexy right eye, no new findings in either eye.  Observe  2.  Follow-up with Dr. [name] as scheduled  3.  Ophthalmic Meds Ordered this visit:  No orders of the  defined types were placed in this encounter.      Return in about 1 year (around 09/29/2021) for DILATE OU, COLOR FP.  Patient Instructions  Vitreous Detachment  Vitreous detachment is part of the normal aging process in the eyes. Vitreous is the jelly-like substance that makes up most of the inside of the eyeballs. It helps the eyeballs keep a round shape. The vitreous is attached to theretina of the eye with a series of fibers. As you age, the vitreous gradually shrinks. Tension increases between the fibers and the retina. Eventually, the fibers can break free from the retina, causing vitreous detachment. In most cases, this does not cause problems and does not require treatment. However, it can sometimes cause the retina to separate from the eyeball (retinal detachment), which requires treatment to prevent vision loss. What are the causes? Aging is the main cause of vitreous detachment. Everyone's vitreous naturallyshrinks with age. What increases the risk? You are more likely to have vitreous detachment if you: Are at least 71 years old. Have inflammation of the eye. Have an eye injury. Have had eye surgery. Have a hemorrhage in your eye. Are very nearsighted (myopia). Have diabetes. What are the signs or symptoms? Most people with this condition will not notice any symptoms. If symptoms do occur, the most common are floaters. Floaters occur as the vitreous begins to shrink. They may: Appear as tiny dots or webs in your vision. Seem to disappear when you look at them directly. Appear more often as your condition gets worse. Other symptoms include: Flashes of light (photopsia) in your peripheral vision that may  look like lightning streaks. Decreased vision or a dark curtain or shadow moving across your field of vision. This is rare. How is this diagnosed? This condition may be diagnosed based on: Your signs and symptoms. An exam by a health care provider who specializes in conditions and diseases of the eye (ophthalmologist). The exam may include: Putting eye drops in your eye to make the pupil wider (dilated). The pupil is the opening in the center of the eye. Checking the pupils with a magnifying glass. This exam is the best way to determine the type and extent of damage to your eye. How is this treated? For most people, a vitreous detachment is harmless, causing no symptoms or vision loss, and does not require treatment. Floaters usually become lessnoticeable over time. If the condition causes retinal detachment, you may need eye surgery to reattach your retina (reattachment surgery) in order to prevent vision loss or restore your vision. Follow these instructions at home: Keep all follow-up visits as told by your health care provider. This is important. Get help right away if: You develop signs of retinal detachment. These include: A sudden increase in the number of floaters you see. An increase in the number of flashes of light you see in your peripheral vision. Decreased vision. Summary Vitreous detachment is part of the normal aging process in the eyes. Vitreous is the jelly-like substance inside the eyeballs. As you age, the vitreous shrinks, and the fibers that attach the vitreous to the retina can break free, causing vitreous detachment. In most cases, vitreous detachment does not cause symptoms and does not require treatment. The most common symptom that can occur is seeing floaters that appear as tiny dots or webs in your vision. Vitreous detachment can sometimes cause the retina to separate from the eyeball (retinal detachment). This must be treated to prevent vision loss. This  information is  not intended to replace advice given to you by your health care provider. Make sure you discuss any questions you have with your healthcare provider. Document Revised: 12/05/2019 Document Reviewed: 12/05/2019 Elsevier Patient Education  2022 [employer] the diagnoses, plan, and follow up with the patient and they expressed understanding.  Patient expressed understanding of the importance of proper follow up care.   Clent Demark Melonee Gerstel M.D. Diseases & Surgery of the Retina and Vitreous [facility]ina [facility]abetic [facility] 09/29/20     Abbreviations: M myopia (nearsighted); A astigmatism; H hyperopia (farsighted); P presbyopia; Mrx spectacle prescription;  CTL contact lenses; OD right eye; OS left eye; OU both eyes  XT exotropia; ET esotropia; PEK punctate epithelial keratitis; PEE punctate epithelial erosions; DES dry eye syndrome; MGD meibomian gland dysfunction; ATs artificial tears; PFAT's preservative free artificial tears; [place] nuclear sclerotic cataract; PSC posterior subcapsular cataract; ERM epi-retinal membrane; PVD posterior vitreous detachment; RD retinal detachment; DM diabetes mellitus; DR diabetic retinopathy; NPDR non-proliferative diabetic retinopathy; PDR proliferative diabetic retinopathy; CSME clinically significant macular edema; DME diabetic macular edema; dbh dot blot hemorrhages; CWS cotton wool spot; POAG primary open angle glaucoma; C/D cup-to-disc ratio; HVF humphrey visual field; GVF goldmann visual field; OCT optical coherence tomography; IOP intraocular pressure; BRVO Branch retinal vein occlusion; CRVO central retinal vein occlusion; CRAO central retinal artery occlusion; BRAO branch retinal artery occlusion; RT retinal tear; SB scleral buckle; PPV pars plana vitrectomy; VH Vitreous hemorrhage; PRP panretinal laser photocoagulation; IVK intravitreal kenalog; VMT vitreomacular traction; MH Macular hole;  NVD neovascularization of the disc; NVE  neovascularization elsewhere; AREDS age related eye disease study; ARMD age related macular degeneration; POAG primary open angle glaucoma; EBMD epithelial/anterior basement membrane dystrophy; ACIOL anterior chamber intraocular lens; IOL intraocular lens; PCIOL posterior chamber intraocular lens; Phaco/IOL phacoemulsification with intraocular lens placement; [place] photorefractive keratectomy; LASIK laser assisted in situ keratomileusis; HTN hypertension; DM diabetes mellitus; COPD chronic obstructive pulmonary disease

## 2020-09-29 NOTE — Assessment & Plan Note (Signed)
Stable OD °

## 2020-10-01 ENCOUNTER — Telehealth: Payer: Self-pay | Admitting: Physician Assistant

## 2020-10-01 ENCOUNTER — Other Ambulatory Visit: Payer: Self-pay | Admitting: Allergy and Immunology

## 2020-10-01 DIAGNOSIS — J4540 Moderate persistent asthma, uncomplicated: Secondary | ICD-10-CM

## 2020-10-01 NOTE — Telephone Encounter (Signed)
Left message for patient to schedule Annual Wellness Visit.  Please schedule with Nurse Health Advisor Charlott Rakes, RN at [facility].

## 2020-10-14 ENCOUNTER — Ambulatory Visit: Payer: Medicare Other | Admitting: Family Medicine

## 2020-10-27 ENCOUNTER — Other Ambulatory Visit: Payer: Self-pay | Admitting: Physician Assistant

## 2020-12-17 ENCOUNTER — Telehealth: Payer: Self-pay

## 2020-12-17 MED ORDER — INDOMETHACIN ER 75 MG PO CPCR: 75.0000 mg | ORAL_CAPSULE | Freq: Every day | ORAL | 0 refills | Status: DC

## 2020-12-17 NOTE — Telephone Encounter (Signed)
  Encourage patient to contact the pharmacy for refills or they can request refills through [place]:  09/04/20   NEXT APPOINTMENT DATE: 01/22/21  MEDICATION: indomethacin (INDOCIN SR) 75 MG CR capsule  Is the patient out of medication? Yes   PHARMACY: [place], [place]: Patient states [facility] has faxed a request 3 times with no response.   Let patient know to contact pharmacy at the end of the day to make sure medication is ready.  Please notify patient to allow 48-72 hours to process

## 2020-12-17 NOTE — Telephone Encounter (Signed)
Pt requesting refill for Indomethacin 75 mg. Please advise if okay to fill has not been prescribed by you?

## 2020-12-17 NOTE — Telephone Encounter (Signed)
Spoke to pt told her Rx for Indomethacin was sent to pharmacy. Pt verbalized understanding.

## 2020-12-31 ENCOUNTER — Telehealth: Payer: Self-pay

## 2020-12-31 NOTE — Telephone Encounter (Signed)
LAST APPOINTMENT DATE:  09/04/20  NEXT APPOINTMENT DATE: 01/22/21  MEDICATION:irbesartan (AVAPRO) 300 MG tablet  [place], [place]

## 2020-12-31 NOTE — Telephone Encounter (Signed)
Vanessa Harmon, pt requesting refill for Irbesartan 300 mg daily at [facility]. Has not been prescribed by you, please advise if okay to fill?

## 2021-01-01 MED ORDER — IRBESARTAN 300 MG PO TABS: 300.0000 mg | ORAL_TABLET | Freq: Every day | ORAL | 0 refills | Status: DC

## 2021-01-01 NOTE — Telephone Encounter (Signed)
Pt notified Rx for Irbesartan sent to pharmacy.

## 2021-01-04 ENCOUNTER — Other Ambulatory Visit: Payer: Self-pay | Admitting: Physician Assistant

## 2021-01-12 ENCOUNTER — Ambulatory Visit (INDEPENDENT_AMBULATORY_CARE_PROVIDER_SITE_OTHER): Payer: Medicare Other | Admitting: Internal Medicine

## 2021-01-12 ENCOUNTER — Encounter (HOSPITAL_BASED_OUTPATIENT_CLINIC_OR_DEPARTMENT_OTHER): Payer: Self-pay | Admitting: Internal Medicine

## 2021-01-12 ENCOUNTER — Other Ambulatory Visit: Payer: Self-pay | Admitting: Physician Assistant

## 2021-01-12 ENCOUNTER — Other Ambulatory Visit: Payer: Self-pay

## 2021-01-12 ENCOUNTER — Telehealth: Payer: Self-pay | Admitting: Internal Medicine

## 2021-01-12 VITALS — BP 136/84 | HR 64 | Ht 65.5 in | Wt 174.0 lb

## 2021-01-12 DIAGNOSIS — E7849 Other hyperlipidemia: Secondary | ICD-10-CM

## 2021-01-12 DIAGNOSIS — M791 Myalgia, unspecified site: Secondary | ICD-10-CM

## 2021-01-12 DIAGNOSIS — T466X5A Adverse effect of antihyperlipidemic and antiarteriosclerotic drugs, initial encounter: Secondary | ICD-10-CM

## 2021-01-12 DIAGNOSIS — M459 Ankylosing spondylitis of unspecified sites in spine: Secondary | ICD-10-CM | POA: Diagnosis not present

## 2021-01-12 MED ORDER — REPATHA SURECLICK 140 MG/ML ~~LOC~~ SOAJ: 1.0000 | SUBCUTANEOUS | 11 refills | Status: DC

## 2021-01-12 NOTE — Progress Notes (Signed)
LIPID CLINIC CONSULT NOTE  Chief Complaint:  Manage dyslipidemia  Primary Care Physician: Inda Coke, PA  Primary Cardiologist:  None  HPI:  Vanessa Harmon is a 70 y.o. female who is being seen today for the evaluation of dyslipidemia at the request of Inda Coke, [place].  This is a pleasant 70 year old female kindly referred for evaluation and management of dyslipidemia.  She has a history of longstanding dyslipidemia previously on atorvastatin however developed significant myalgias and had to discontinue the medicine.  Her LDL cholesterol had been much better controlled around 100 however recent labs in April off of medication showed total cholesterol 305, HDL 52, triglycerides 149 and LDL 223.  These findings are highly suggestive of a genetic dyslipidemia, probably familial hyperlipidemia.  She notes her mom had a stroke at early age (<50) and also had high cholesterol and she also had a brother with high cholesterol (unclear if greater than 190).  Based on the [employer] criteria, this would be considered probable familial hyperlipidemia.  Unfortunately, she also has statin intolerance, in addition was tried on rosuvastatin after the atorvastatin and could not tolerate it due to myalgias.  She was placed on ezetimibe.  PMHx:  Past Medical History:  Diagnosis Date   Adenomatous polyp    Allergy    Anxiety    takes Ativan daily as needed   Arthritis    inflammatory arthritis (Dr. Dagoberto Ligas)   Asthma    Cataract    Chronic back pain    scoliosis    Constipation    takes miralax every other day   Depression    takes Lexapro daily   Eczema    Essential hypertension, benign    takes Hyzaar and Metoprolol daily   Family history of adverse reaction to anesthesia    Sister PONV   Fibromyalgia    GERD (gastroesophageal reflux disease)    History of colon polyps    History of shingles    Hyperlipidemia    takes Atorvastatin daily   Insomnia    takes  Melatonin nightly   Joint pain    Osteoarthritis    Osteoporosis    PSVT (paroxysmal supraventricular tachycardia) (HCC)    Recurrent upper respiratory infection (URI)    Rheumatoid arthritis ([place])    sees Dr. Amil Amen; stable on Orencia   Rhinitis, allergic    uses Flonase daily   Urinary urgency    Urticaria     Past Surgical History:  Procedure Laterality Date   ADENOIDECTOMY     APPENDECTOMY     BACK SURGERY  2012   at age 12 d/t scoliosis   BLEPHAROPLASTY  2018   BREAST BIOPSY     cataract surgery     COLONOSCOPY     HARDWARE REMOVAL Left 02/24/2020   Procedure: HARDWARE REMOVAL LEFT DISTAL RADIUS;  Surgeon: Leanora Cover, MD;  Location: [facility];  Service: Orthopedics;  Laterality: Left;   KNEE ARTHROSCOPY Bilateral    neuroma removed from foot     unsure of which foot   OPEN REDUCTION INTERNAL FIXATION (ORIF) DISTAL RADIAL FRACTURE Left 08/15/2019   Procedure: OPEN REDUCTION INTERNAL FIXATION (ORIF) DISTAL RADIAL FRACTURE;  Surgeon: Leanora Cover, MD;  Location: [facility];  Service: Orthopedics;  Laterality: Left;  block in preop   POSTERIOR CERVICAL FUSION/FORAMINOTOMY N/A 05/29/2020   Procedure: Removal of hardware, Replacement of thoracic 8, thoracic 9, thoracic 10 screws, Right Thoracic 6-7, Right Thoracic 7-8 Laminectomies  with thoracic hooks from Thoracic 4-5 to Thoracic 10;  Surgeon: Erline Levine, MD;  Location: [facility];  Service: Neurosurgery;  Laterality: N/A;   POSTERIOR LUMBAR FUSION 4 LEVEL N/A 02/15/2018   Procedure: Decompression and fusion Thoracic nine to Lumbar two with exploration of previous fusion;  Surgeon: Erline Levine, MD;  Location: [facility];  Service: Neurosurgery;  Laterality: N/A;   SHOULDER SURGERY Right    TONSILLECTOMY     TOTAL HIP ARTHROPLASTY Right 04/15/2014   Procedure: RIGHT TOTAL HIP ARTHROPLASTY ANTERIOR APPROACH;  Surgeon: Renette Butters, MD;  Location: [place] [facility];  Service: Orthopedics;  Laterality: Right;    TOTAL KNEE ARTHROPLASTY Left 11/26/2019   Procedure: TOTAL KNEE ARTHROPLASTY;  Surgeon: Paralee Cancel, MD;  Location: [facility];  Service: Orthopedics;  Laterality: Left;  70 mins    FAMHx:  Family History  Problem Relation Age of Onset   Stroke Mother    Allergic rhinitis Mother    Allergic rhinitis Father    AAA (abdominal aortic aneurysm) Father    COPD Father    Prostate cancer Father    Hypertension Other        unspecified grandmother   AAA (abdominal aortic aneurysm) Other        unspecified grandfather   Cancer Other        unspecified grandfather   Neuropathy Neg Hx     SOCHx:   reports that she quit smoking about 24 years ago. Her smoking use included cigarettes. She has never used smokeless tobacco. She reports current alcohol use of about 1.0 standard drink per week. She reports that she does not use drugs.  ALLERGIES:  Allergies  Allergen Reactions   Advair Hfa [Fluticasone-Salmeterol] Other (See Comments)    Hoarness   Statins Other (See Comments)   Breo Ellipta [Fluticasone Furoate-Vilanterol] Other (See Comments)    Severe hoarseness    Fosamax [Alendronate Sodium] Other (See Comments)    myalgias   Levaquin [Levofloxacin] Other (See Comments)    Lightheadedness, not feeling well, ear pain   Doxycycline Hyclate    Infliximab Other (See Comments)    Skin reaction   Spiriva Handihaler [Tiotropium Bromide Monohydrate]     UNSPECIFIED REACTION    Verapamil    Tape Rash    PAPER TAPE: Causes severe rash and weeping    ROS: Pertinent items noted in HPI and remainder of comprehensive ROS otherwise negative.  HOME MEDS: Current Outpatient Medications on File Prior to Visit  Medication Sig Dispense Refill   abatacept (ORENCIA) 250 MG injection Inject 250 mg into the vein every 30 (thirty) days.     albuterol (PROAIR HFA) 108 (90 Base) MCG/ACT inhaler Inhale two puffs every 4-6 hours if needed for cough or wheeze. (Patient taking differently: Inhale 2 puffs  into the lungs every 4 (four) hours as needed for wheezing or shortness of breath. Inhale two puffs every 4-6 hours if needed for cough or wheeze.) 8.5 Inhaler 1   aspirin EC 81 MG tablet Take 1 tablet (81 mg total) by mouth daily. Swallow whole. 30 tablet 11   azelastine (ASTELIN) 0.1 % nasal spray Place 1 spray into both nostrils 2 (two) times daily. Use in each nostril as directed     budesonide (PULMICORT) 0.5 MG/2ML nebulizer solution Inhale into the lungs.     Calcium Carb-Cholecalciferol (CALCIUM 600/VITAMIN D3 PO) Take 1 tablet by mouth daily.     carboxymethylcellulose (REFRESH PLUS) 0.5 % SOLN Place 1 drop into both eyes 3 (  three) times daily as needed (dry eyes).      Cetirizine HCl (ZYRTEC ALLERGY) 10 MG CAPS Zyrtec 10 mg capsule     denosumab (PROLIA) 60 MG/ML SOSY injection Inject 60 mg into the skin every 6 (six) months.     diphenhydrAMINE (BENADRYL) 25 MG tablet Take 50 mg by mouth daily as needed for allergies.     EPINEPHrine 0.3 mg/0.3 mL IJ SOAJ injection epinephrine 0.3 mg/0.3 mL injection, auto-injector     escitalopram (LEXAPRO) 10 MG tablet TAKE 1 TABLET (10 MG TOTAL) BY MOUTH DAILY. 90 tablet 0   ezetimibe (ZETIA) 10 MG tablet Take 1 tablet (10 mg total) by mouth daily. 30 tablet 5   famotidine (PEPCID) 40 MG tablet TAKE 1 TABLET (40 MG TOTAL) BY MOUTH AT BEDTIME. 30 tablet 5   famotidine (PEPCID) 40 MG tablet Take by mouth.     fluticasone (FLOVENT HFA) 220 MCG/ACT inhaler Inhale 2 puffs into the lungs 2 (two) times daily.     furosemide (LASIX) 40 MG tablet Take by mouth.     gabapentin (NEURONTIN) 300 MG capsule Take 300 mg by mouth at bedtime.     guaiFENesin (MUCINEX) 600 MG 12 hr tablet Take 600 mg by mouth 2 (two) times daily as needed for cough or to loosen phlegm.     HYDROcodone-acetaminophen (NORCO/VICODIN) 5-325 MG tablet Take 1 tablet by mouth 3 (three) times daily as needed.     indomethacin (INDOCIN SR) 75 MG CR capsule Take 1 capsule (75 mg total) by mouth  daily with breakfast. 30 capsule 0   ipratropium (ATROVENT) 0.03 % nasal spray Place 1 spray into both nostrils 2 (two) times daily as needed for rhinitis.     irbesartan (AVAPRO) 300 MG tablet Take 1 tablet (300 mg total) by mouth daily. 30 tablet 0   Lifitegrast (XIIDRA) 5 % SOLN Place 1 drop into both eyes in the morning and at bedtime.      methocarbamol (ROBAXIN) 500 MG tablet Take 1 tablet (500 mg total) by mouth every 6 (six) hours as needed for muscle spasms. 60 tablet 2   montelukast (SINGULAIR) 10 MG tablet Take 1 tablet (10 mg total) by mouth at bedtime. 30 tablet 5   Multiple Vitamin (MULTIVITAMIN) capsule Take 1 capsule by mouth daily.     Omega-3 Fatty Acids (FISH OIL PO) Take 1,000 mg by mouth daily.     omeprazole (PRILOSEC) 40 MG capsule TAKE ONE CAPSULE BY MOUTH DAILY BEFORE BREAKFAST DAILY (Patient taking differently: Take 40 mg by mouth daily. TAKE ONE CAPSULE BY MOUTH DAILY BEFORE BREAKFAST DAILY) 30 capsule 4   OVER THE COUNTER MEDICATION Place 1 application into both eyes at bedtime. Genteal overnight eye ointment     polyethylene glycol powder (GLYCOLAX/MIRALAX) 17 GM/SCOOP powder Miralax 17 gram/dose oral powder     predniSONE (DELTASONE) 5 MG tablet Take 5 mg by mouth daily with breakfast.      PRESCRIPTION MEDICATION Place 1 spray into the nose in the morning and at bedtime. Budesonide 0.5 mg / 2 mL Nasal rinse     pseudoephedrine (SUDAFED) 30 MG tablet Take 30 mg by mouth every 4 (four) hours as needed for congestion.     traZODone (DESYREL) 150 MG tablet Take 1 tablet by mouth daily.     valACYclovir (VALTREX) 1000 MG tablet Take 1,000 mg by mouth daily.      vitamin C (ASCORBIC ACID) 500 MG tablet Take 500 mg by mouth daily.  No current facility-administered medications on file prior to visit.    LABS/IMAGING: No results found for this or any previous visit (from the past 48 hour(s)). No results found.  LIPID PANEL:    Component Value Date/Time   CHOL 305  (H) 07/29/2020 1100   TRIG 149.0 07/29/2020 1100   HDL 52.50 07/29/2020 1100   CHOLHDL 6 07/29/2020 1100   VLDL 29.8 07/29/2020 1100   LDLCALC 223 (H) 07/29/2020 1100   LDLCALC 126 (H) 04/13/2017 1201    WEIGHTS: Wt Readings from Last 3 Encounters:  01/12/21 174 lb (78.9 kg)  09/04/20 177 lb 6.4 oz (80.5 kg)  07/22/20 176 lb 8 oz (80.1 kg)    VITALS: BP 136/84   Pulse 64   Ht 5' 5.5" (1.664 m)   Wt 174 lb (78.9 kg)   SpO2 96%   BMI 28.51 kg/m   EXAM: General appearance: alert and no distress Neck: no carotid bruit, no JVD, and thyroid not enlarged, symmetric, no tenderness/mass/nodules Lungs: clear to auscultation bilaterally Heart: regular rate and rhythm, S1, S2 normal, no murmur, click, rub or gallop Abdomen: soft, non-tender; bowel sounds normal; no masses,  no organomegaly Extremities: extremities normal, atraumatic, no cyanosis or edema Pulses: 2+ and symmetric Skin: Skin color, texture, turgor normal. No rashes or lesions Neurologic: Grossly normal Psych: Pleasant  *Examination chaperoned by Sheral Apley, RN.  EKG: N/A  ASSESSMENT: Probable familial hyperlipidemia, LDL greater than 190 (by Baruch Merl criteria Family history of premature stroke in her mother (earlier than age 11) Marked dyslipidemia in her mother and brother Statin intolerance-myalgias, on ezetimibe Rheumatoid arthritis  PLAN: 1.   Ms. Tamargo has a probable familial hyperlipidemia with LDL greater than 190.  Unfortunately she has been statin intolerant having tried to high potency statins causing myalgias.  She also has family history of early onset ASCVD with stroke in her mother and high cholesterol in both her mother and brother.  Based on this, she is a good candidate for PCSK9 inhibitor as there are few other alternatives that would be associated with significant lipid lowering and cardiovascular risk reduction.  She is already on ezetimibe 10 mg daily.  Would recommend Repatha 140 mg  every 2 weeks.  Plan repeat lipids in about 3 months and an LP(a).  Thanks for the kind referral.  Pixie Casino, MD, FACC, [place] Director of the [facility]  [facility] Diplomate of the American Board of Clinical Lipidology Attending Cardiologist  Direct Dial: [phone]  Fax: [phone]  Website:  [email or web address] [email or web address] Jobina Maita 01/12/2021, 1:41 PM

## 2021-01-12 NOTE — Telephone Encounter (Signed)
PA submitted via [facility]  (Key: [id number])

## 2021-01-12 NOTE — Patient Instructions (Signed)
Medication Instructions:  Dr. Debara Pickett recommends Repatha 140mg /mL (PCSK9). This is an injectable cholesterol medication self-administered once every 14 days. This medication will likely need prior approval with your insurance company, which we will work on. If the medication is not approved initially, we may need to do an appeal with your insurance.   Administer medication in area of fatty tissue such as abdomen, outer thigh, back of upper arm - and rotate site with each injection Store medication in refrigerator until ready to administer - allow to sit at room temp for 30 mins - 1 hour prior to injection Dispose of medication in a SHARPS container - your pharmacy should be able to direct you on this and proper disposal   If you need a co-pay card for Repatha: [email or web address] >> paying for Repatha or red box that says "[place]" in top right If you need a co-pay card for Praluent: WedMap.it >> starting & paying for Praluent  Patient Assistance:   The [employer]: https://www.panfoundation.org/disease-funds/hypercholesterolemia/ -- can sign up for wait list  The [employer] foundation offers assistance to help pay for medication copays.  They will cover copays for all cholesterol lowering meds, including statins, fibrates, omega-3 fish oils like Vascepa, ezetimibe, Repatha, Praluent, Nexletol, Nexlizet.  The cards are usually good for $2,500 or 12 months, whichever comes first. Go to healthwellfoundation.org Click on "Apply Now" Answer questions as to whom is applying (patient or representative) Your disease fund will be "hypercholesterolemia - Medicare access" They will ask questions about finances and which medications you are taking for cholesterol When you submit, the approval is usually within minutes.  You will need to print the card information from the site You will need to show this information to your pharmacy, they will bill your Medicare Part D plan first -then bill [employer] --for the copay.   You can also call them at [phone], although the hold times can be quite long.     *If you need a refill on your cardiac medications before your next appointment, please call your pharmacy*   Lab Work: FASTING lab work to check cholesterol before your next visit with Dr. Debara Pickett   If you have labs (blood work) drawn today and your tests are completely normal, you will receive your results only by: [place] (if you have MyChart) OR A paper copy in the mail If you have any lab test that is abnormal or we need to change your treatment, we will call you to review the results.   Testing/Procedures: NONE   Follow-Up: At [facility], you and your health needs are our priority.  As part of our continuing mission to provide you with exceptional heart care, we have created designated Provider Care Teams.  These Care Teams include your primary Cardiologist (physician) and Advanced Practice Providers (APPs -  Physician Assistants and Nurse Practitioners) who all work together to provide you with the care you need, when you need it.  We recommend signing up for the patient portal called "MyChart".  Sign up information is provided on this After Visit Summary.  MyChart is used to connect with patients for Virtual Visits (Telemedicine).  Patients are able to view lab/test results, encounter notes, upcoming appointments, etc.  Non-urgent messages can be sent to your provider as well.   To learn more about what you can do with MyChart, go to [email or web address].    Your next appointment:   3-4 month(s)  The format for your next appointment:  In Person  Provider:   K. Mali Hilty, MD   Other Instructions

## 2021-01-13 NOTE — Telephone Encounter (Signed)
Request Reference Number: [id number]. REPATHA SURE INJ 140MG /ML is approved through 07/12/2021

## 2021-01-22 ENCOUNTER — Other Ambulatory Visit: Payer: Self-pay

## 2021-01-22 ENCOUNTER — Encounter: Payer: Self-pay | Admitting: Physician Assistant

## 2021-01-22 ENCOUNTER — Ambulatory Visit (INDEPENDENT_AMBULATORY_CARE_PROVIDER_SITE_OTHER): Payer: Medicare Other | Admitting: Physician Assistant

## 2021-01-22 VITALS — BP 138/80 | HR 60 | Temp 97.4°F | Ht 65.5 in | Wt 171.4 lb

## 2021-01-22 DIAGNOSIS — F3289 Other specified depressive episodes: Secondary | ICD-10-CM | POA: Diagnosis not present

## 2021-01-22 DIAGNOSIS — I10 Essential (primary) hypertension: Secondary | ICD-10-CM

## 2021-01-22 NOTE — Progress Notes (Signed)
Vanessa Harmon is a 70 y.o. female is here for follow up.  I acted as a Education administrator for [employer], PA-C [employer], LPN   History of Present Illness:   Chief Complaint  Patient presents with   Hypertension   Anxiety   Depression    HPI  Hypertension Pt here for  f/u on blood pressure.  She is taking irbesartan 300 mg daily and lasix 40 mg daily.  She is checking it maybe once a week. Has been running 578'I systolic, little later is 696'E, diastolic 95'M. Pt denies blurred vision, chest pain, SOB. Having some headaches, dizziness due to allergies and lower leg edema. Denies excessive caffeine intake, stimulant usage, excessive alcohol intake or increase in salt consumption.  She has seen nephrology for this.  We reviewed records from June 2022.  Anxiety / Depression Currently taking Lexapro 10 mg daily and trazodone 150 mg daily. Pt says she is under a lot of stress lately. Denies SI/HI.     Health Maintenance Due  Topic Date Due   Hepatitis C Screening  Never done   COVID-19 Vaccine (5 - Booster for Pfizer series) 12/03/2020    Past Medical History:  Diagnosis Date   Adenomatous polyp    Allergy    Anxiety    takes Ativan daily as needed   Arthritis    inflammatory arthritis (Dr. Dagoberto Ligas)   Asthma    Cataract    Chronic back pain    scoliosis    Constipation    takes miralax every other day   Depression    takes Lexapro daily   Eczema    Essential hypertension, benign    takes Hyzaar and Metoprolol daily   Family history of adverse reaction to anesthesia    Sister PONV   Fibromyalgia    GERD (gastroesophageal reflux disease)    History of colon polyps    History of shingles    Hyperlipidemia    takes Atorvastatin daily   Insomnia    takes Melatonin nightly   Joint pain    Osteoarthritis    Osteoporosis    PSVT (paroxysmal supraventricular tachycardia) (HCC)    Recurrent upper respiratory infection (URI)    Rheumatoid arthritis  ([place])    sees Dr. Amil Amen; stable on Orencia   Rhinitis, allergic    uses Flonase daily   Urinary urgency    Urticaria      Social History   Tobacco Use   Smoking status: Former    Types: Cigarettes    Quit date: 1998    Years since quitting: 24.7   Smokeless tobacco: Never   Tobacco comments:    hasn't smoked in 75yrs  Vaping Use   Vaping Use: Never used  Substance Use Topics   Alcohol use: Yes    Alcohol/week: 1.0 standard drink    Types: 1 Glasses of wine per week    Comment: rarely with dinner   Drug use: No    Past Surgical History:  Procedure Laterality Date   ADENOIDECTOMY     APPENDECTOMY     BACK SURGERY  2012   at age 40 d/t scoliosis   BLEPHAROPLASTY  2018   BREAST BIOPSY     cataract surgery     COLONOSCOPY     HARDWARE REMOVAL Left 02/24/2020   Procedure: HARDWARE REMOVAL LEFT DISTAL RADIUS;  Surgeon: Leanora Cover, MD;  Location: [place];  Service: Orthopedics;  Laterality: Left;   KNEE  ARTHROSCOPY Bilateral    neuroma removed from foot     unsure of which foot   OPEN REDUCTION INTERNAL FIXATION (ORIF) DISTAL RADIAL FRACTURE Left 08/15/2019   Procedure: OPEN REDUCTION INTERNAL FIXATION (ORIF) DISTAL RADIAL FRACTURE;  Surgeon: Leanora Cover, MD;  Location: [place];  Service: Orthopedics;  Laterality: Left;  block in preop   POSTERIOR CERVICAL FUSION/FORAMINOTOMY N/A 05/29/2020   Procedure: Removal of hardware, Replacement of thoracic 8, thoracic 9, thoracic 10 screws, Right Thoracic 6-7, Right Thoracic 7-8 Laminectomies with thoracic hooks from Thoracic 4-5 to Thoracic 10;  Surgeon: Erline Levine, MD;  Location: [place];  Service: Neurosurgery;  Laterality: N/A;   POSTERIOR LUMBAR FUSION 4 LEVEL N/A 02/15/2018   Procedure: Decompression and fusion Thoracic nine to Lumbar two with exploration of previous fusion;  Surgeon: Erline Levine, MD;  Location: [place];  Service: Neurosurgery;  Laterality: N/A;   SHOULDER SURGERY Right     TONSILLECTOMY     TOTAL HIP ARTHROPLASTY Right 04/15/2014   Procedure: RIGHT TOTAL HIP ARTHROPLASTY ANTERIOR APPROACH;  Surgeon: Renette Butters, MD;  Location: [place];  Service: Orthopedics;  Laterality: Right;   TOTAL KNEE ARTHROPLASTY Left 11/26/2019   Procedure: TOTAL KNEE ARTHROPLASTY;  Surgeon: Paralee Cancel, MD;  Location: [facility];  Service: Orthopedics;  Laterality: Left;  70 mins    Family History  Problem Relation Age of Onset   Stroke Mother    Allergic rhinitis Mother    Allergic rhinitis Father    AAA (abdominal aortic aneurysm) Father    COPD Father    Prostate cancer Father    Hypertension Other        unspecified grandmother   AAA (abdominal aortic aneurysm) Other        unspecified grandfather   Cancer Other        unspecified grandfather   Neuropathy Neg Hx     PMHx, SurgHx, SocialHx, FamHx, Medications, and Allergies were reviewed in the Visit Navigator and updated as appropriate.   Patient Active Problem List   Diagnosis Date Noted   Posterior vitreous detachment of left eye 09/29/2020   Thoracic spondylosis with cord compression 05/29/2020   Heterotopic ossification 03/30/2020   Loosening of hardware in spine ([place]) 01/27/2020   Posterior vitreous detachment of right eye 01/21/2020   Pseudophakia 01/21/2020   [place] retinal tear of right eye 01/21/2020   Body mass index (BMI) 28.0-28.9, adult 11/27/2019   Left knee OA 11/26/2019   Numbness of foot 02/07/2019   Rheumatoid arthritis ([place]) 01/25/2019   Peripheral polyneuropathy 01/14/2019   Spinal stenosis, thoracolumbar region 01/14/2019   Thoracogenic scoliosis, thoracolumbar region 01/14/2019   Idiopathic scoliosis of thoracolumbar region 02/15/2018   Rhinitis, chronic 05/16/2017   Moderate persistent asthma without complication 56/81/2751   Polymyalgia rheumatica ([place]) 02/15/2015   DJD (degenerative joint disease) 04/15/2014   Lumbar spine scoliosis 11/28/2013   Degenerative spondylolisthesis  10/09/2013   Lumbar radiculopathy 09/16/2013   Lumbosacral radiculitis 07/31/2013   Scoliosis, or kyphoscoliosis, idiopathic 07/31/2013   Lumbar back pain 12/19/2011   Hyperlipidemia 10/16/2010   Depression 10/16/2010   Lumbar spondylosis 10/16/2010   Gastroesophageal reflux disease 10/16/2010   Adenomatous polyp of colon 10/16/2010   History of paroxysmal supraventricular tachycardia 10/16/2010   Essential (primary) hypertension 10/16/2010    Social History   Tobacco Use   Smoking status: Former    Types: Cigarettes    Quit date: 1998    Years since quitting: 24.7   Smokeless tobacco: Never  Tobacco comments:    hasn't smoked in 21yrs  Vaping Use   Vaping Use: Never used  Substance Use Topics   Alcohol use: Yes    Alcohol/week: 1.0 standard drink    Types: 1 Glasses of wine per week    Comment: rarely with dinner   Drug use: No    Current Medications and Allergies:    Current Outpatient Medications:    abatacept (ORENCIA) 250 MG injection, Inject 250 mg into the vein every 30 (thirty) days., Disp: , Rfl:    albuterol (PROAIR HFA) 108 (90 Base) MCG/ACT inhaler, Inhale two puffs every 4-6 hours if needed for cough or wheeze. (Patient taking differently: Inhale 2 puffs into the lungs every 4 (four) hours as needed for wheezing or shortness of breath. Inhale two puffs every 4-6 hours if needed for cough or wheeze.), Disp: 8.5 Inhaler, Rfl: 1   aspirin EC 81 MG tablet, Take 1 tablet (81 mg total) by mouth daily. Swallow whole., Disp: 30 tablet, Rfl: 11   azelastine (ASTELIN) 0.1 % nasal spray, Place 1 spray into both nostrils 2 (two) times daily. Use in each nostril as directed, Disp: , Rfl:    budesonide (PULMICORT) 0.5 MG/2ML nebulizer solution, Inhale into the lungs., Disp: , Rfl:    Calcium Carb-Cholecalciferol (CALCIUM 600/VITAMIN D3 PO), Take 1 tablet by mouth daily., Disp: , Rfl:    carboxymethylcellulose (REFRESH PLUS) 0.5 % SOLN, Place 1 drop into both eyes 3 (three)  times daily as needed (dry eyes). , Disp: , Rfl:    Cetirizine HCl (ZYRTEC ALLERGY) 10 MG CAPS, Zyrtec 10 mg capsule, Disp: , Rfl:    denosumab (PROLIA) 60 MG/ML SOSY injection, Inject 60 mg into the skin every 6 (six) months., Disp: , Rfl:    diphenhydrAMINE (BENADRYL) 25 MG tablet, Take 50 mg by mouth daily as needed for allergies., Disp: , Rfl:    EPINEPHrine 0.3 mg/0.3 mL IJ SOAJ injection, epinephrine 0.3 mg/0.3 mL injection, auto-injector, Disp: , Rfl:    escitalopram (LEXAPRO) 10 MG tablet, TAKE 1 TABLET (10 MG TOTAL) BY MOUTH DAILY., Disp: 90 tablet, Rfl: 0   Evolocumab (REPATHA SURECLICK) 989 MG/ML SOAJ, Inject 1 Dose into the skin every 14 (fourteen) days., Disp: 2 mL, Rfl: 11   ezetimibe (ZETIA) 10 MG tablet, Take 1 tablet (10 mg total) by mouth daily., Disp: 30 tablet, Rfl: 5   famotidine (PEPCID) 40 MG tablet, TAKE 1 TABLET (40 MG TOTAL) BY MOUTH AT BEDTIME., Disp: 30 tablet, Rfl: 5   fluticasone (FLOVENT HFA) 220 MCG/ACT inhaler, Inhale 2 puffs into the lungs 2 (two) times daily., Disp: , Rfl:    furosemide (LASIX) 40 MG tablet, Take by mouth., Disp: , Rfl:    gabapentin (NEURONTIN) 300 MG capsule, Take 300 mg by mouth at bedtime., Disp: , Rfl:    guaiFENesin (MUCINEX) 600 MG 12 hr tablet, Take 600 mg by mouth 2 (two) times daily as needed for cough or to loosen phlegm., Disp: , Rfl:    HYDROcodone-acetaminophen (NORCO/VICODIN) 5-325 MG tablet, Take 1 tablet by mouth 3 (three) times daily as needed., Disp: , Rfl:    indomethacin (INDOCIN SR) 75 MG CR capsule, TAKE ONE CAPSULE BY MOUTH DAILY WITH BREAKFAST, Disp: 30 capsule, Rfl: 0   ipratropium (ATROVENT) 0.03 % nasal spray, Place 1 spray into both nostrils 2 (two) times daily as needed for rhinitis., Disp: , Rfl:    irbesartan (AVAPRO) 300 MG tablet, Take 1 tablet (300 mg total) by mouth daily.,  Disp: 30 tablet, Rfl: 0   Lifitegrast (XIIDRA) 5 % SOLN, Place 1 drop into both eyes in the morning and at bedtime. , Disp: , Rfl:     methocarbamol (ROBAXIN) 500 MG tablet, Take 1 tablet (500 mg total) by mouth every 6 (six) hours as needed for muscle spasms., Disp: 60 tablet, Rfl: 2   montelukast (SINGULAIR) 10 MG tablet, Take 1 tablet (10 mg total) by mouth at bedtime., Disp: 30 tablet, Rfl: 5   Multiple Vitamin (MULTIVITAMIN) capsule, Take 1 capsule by mouth daily., Disp: , Rfl:    Omega-3 Fatty Acids (FISH OIL PO), Take 1,000 mg by mouth daily., Disp: , Rfl:    omeprazole (PRILOSEC) 40 MG capsule, TAKE ONE CAPSULE BY MOUTH DAILY BEFORE BREAKFAST DAILY (Patient taking differently: Take 40 mg by mouth daily. TAKE ONE CAPSULE BY MOUTH DAILY BEFORE BREAKFAST DAILY), Disp: 30 capsule, Rfl: 4   OVER THE COUNTER MEDICATION, Place 1 application into both eyes at bedtime. Gente[place] overnight eye ointment, Disp: , Rfl:    polyethylene glycol powder (GLYCOLAX/MIRALAX) 17 GM/SCOOP powder, Miralax 17 gram/dose oral powder, Disp: , Rfl:    predniSONE (DELTASONE) 5 MG tablet, Take 5 mg by mouth daily with breakfast. , Disp: , Rfl:    PRESCRIPTION MEDICATION, Place 1 spray into the nose in the morning and at bedtime. Budesonide 0.5 mg / 2 mL Nasal rinse, Disp: , Rfl:    pseudoephedrine (SUDAFED) 30 MG tablet, Take 30 mg by mouth every 4 (four) hours as needed for congestion., Disp: , Rfl:    traZODone (DESYREL) 150 MG tablet, Take 1 tablet by mouth daily., Disp: , Rfl:    valACYclovir (VALTREX) 1000 MG tablet, Take 1,000 mg by mouth daily. , Disp: , Rfl:    vitamin C (ASCORBIC ACID) 500 MG tablet, Take 500 mg by mouth daily., Disp: , Rfl:    Allergies  Allergen Reactions   Advair Hfa [Fluticasone-Salmeterol] Other (See Comments)    Hoarness   Statins Other (See Comments)   Breo Ellipta [Fluticasone Furoate-Vilanterol] Other (See Comments)    Severe hoarseness    Fosamax [Alendronate Sodium] Other (See Comments)    myalgias   Levaquin [Levofloxacin] Other (See Comments)    Lightheadedness, not feeling well, ear pain   Doxycycline  Hyclate    Infliximab Other (See Comments)    Skin reaction   Spiriva Handihaler [Tiotropium Bromide Monohydrate]     UNSPECIFIED REACTION    Verapamil    Tape Rash    PAPER TAPE: Causes severe rash and weeping    Review of Systems   ROS Negative unless otherwise specified per HPI.  Vitals:   Vitals:   01/22/21 1344  BP: 138/80  Pulse: 60  Temp: (!) 97.4 F (36.3 C)  TempSrc: Temporal  SpO2: 98%  Weight: 171 lb 6.1 oz (77.7 kg)  Height: 5' 5.5" (1.664 m)     Body mass index is 28.09 kg/m.   Physical Exam:    Physical Exam Vitals and nursing note reviewed.  Constitutional:      General: She is not in acute distress.    Appearance: She is well-developed. She is not ill-appearing or toxic-appearing.  Cardiovascular:     Rate and Rhythm: Normal rate and regular rhythm.     Pulses: Normal pulses.     Heart sounds: Normal heart sounds, S1 normal and S2 normal.  Pulmonary:     Effort: Pulmonary effort is normal.     Breath sounds: Normal breath sounds.  Skin:    General: Skin is warm and dry.  Neurological:     Mental Status: She is alert.     GCS: GCS eye subscore is 4. GCS verbal subscore is 5. GCS motor subscore is 6.  Psychiatric:        Speech: Speech normal.        Behavior: Behavior normal. Behavior is cooperative.     Assessment and Plan:   Essential (primary) hypertension Normotensive in office today, asymptomatic Continue Lasix 40 mg daily and irbesartan 300 mg daily Continue management from nephrology as needed Follow-up in 6 months for CPE, sooner if concerns  Other depression Well-controlled per patient Continue trazodone 150 mg daily and Lexapro 10 mg daily, she denies any need for medication increase at this time Follow-up in 6 months for CPE, sooner if concerns   CMA or LPN served as scribe during this visit. History, Physical, and Plan performed by medical provider. The above documentation has been reviewed and is accurate and  complete.   Inda Coke, PA-C [place] [place] 01/22/2021  Follow-up: No follow-ups on file.

## 2021-01-28 ENCOUNTER — Other Ambulatory Visit: Payer: Self-pay | Admitting: Physician Assistant

## 2021-02-10 ENCOUNTER — Other Ambulatory Visit: Payer: Self-pay | Admitting: Physician Assistant

## 2021-02-25 ENCOUNTER — Other Ambulatory Visit: Payer: Self-pay | Admitting: *Deleted

## 2021-02-25 MED ORDER — IRBESARTAN 300 MG PO TABS
300.0000 mg | ORAL_TABLET | Freq: Every day | ORAL | 5 refills | Status: DC
Start: 2021-02-25 — End: 2021-04-06

## 2021-03-02 ENCOUNTER — Other Ambulatory Visit: Payer: Self-pay | Admitting: Family Medicine

## 2021-03-08 ENCOUNTER — Other Ambulatory Visit: Payer: Self-pay | Admitting: Physician Assistant

## 2021-03-15 ENCOUNTER — Other Ambulatory Visit: Payer: Self-pay | Admitting: Physician Assistant

## 2021-03-16 ENCOUNTER — Encounter: Payer: Self-pay | Admitting: Physician Assistant

## 2021-03-17 MED ORDER — TRAZODONE HCL 150 MG PO TABS: 150.0000 mg | ORAL_TABLET | Freq: Every day | ORAL | 1 refills | Status: DC

## 2021-04-05 ENCOUNTER — Encounter: Payer: Self-pay | Admitting: Physician Assistant

## 2021-04-05 LAB — BASIC METABOLIC PANEL
BUN: 13 (ref 4–21)
CO2: 24 — AB (ref 13–22)
Chloride: 99 (ref 99–108)
Creatinine: 1 (ref 0.5–1.1)
Glucose: 147
Potassium: 4.8 (ref 3.4–5.3)
Sodium: 139 (ref 137–147)

## 2021-04-05 LAB — COMPREHENSIVE METABOLIC PANEL
Albumin: 4.5 (ref 3.5–5.0)
Calcium: 9.4 (ref 8.7–10.7)
GFR calc non Af Amer: 63

## 2021-04-06 MED ORDER — EZETIMIBE 10 MG PO TABS: 10.0000 mg | ORAL_TABLET | Freq: Every day | ORAL | 1 refills | Status: DC

## 2021-04-06 MED ORDER — INDOMETHACIN ER 75 MG PO CPCR: ORAL_CAPSULE | ORAL | 1 refills | Status: DC

## 2021-04-06 MED ORDER — IRBESARTAN 300 MG PO TABS: 300.0000 mg | ORAL_TABLET | Freq: Every day | ORAL | 1 refills | Status: DC

## 2021-04-23 ENCOUNTER — Encounter: Payer: Self-pay | Admitting: Physician Assistant

## 2021-05-04 ENCOUNTER — Other Ambulatory Visit: Payer: Self-pay | Admitting: Physician Assistant

## 2021-05-06 LAB — NMR, LIPOPROFILE
Cholesterol, Total: 208 mg/dL — ABNORMAL HIGH (ref 100–199)
HDL Particle Number: 41.2 umol/L (ref >=30.5)
HDL-C: 57 mg/dL (ref >39)
LDL Particle Number: 1321 nmol/L — ABNORMAL HIGH (ref <1000)
LDL Size: 20.5 nm — ABNORMAL LOW (ref >20.5)
LDL-C (NIH Calc): 114 mg/dL — ABNORMAL HIGH (ref 0–99)
LP-IR Score: 50 — ABNORMAL HIGH (ref <=45)
Small LDL Particle Number: 764 nmol/L — ABNORMAL HIGH (ref <=527)
Triglycerides: 215 mg/dL — ABNORMAL HIGH (ref 0–149)

## 2021-05-06 LAB — LIPOPROTEIN A (LPA): Lipoprotein (a): 33.4 nmol/L (ref <75.0)

## 2021-05-10 ENCOUNTER — Ambulatory Visit (HOSPITAL_BASED_OUTPATIENT_CLINIC_OR_DEPARTMENT_OTHER): Payer: Medicare Other | Admitting: Internal Medicine

## 2021-05-10 ENCOUNTER — Encounter (HOSPITAL_BASED_OUTPATIENT_CLINIC_OR_DEPARTMENT_OTHER): Payer: Self-pay | Admitting: Internal Medicine

## 2021-05-10 ENCOUNTER — Other Ambulatory Visit: Payer: Self-pay

## 2021-05-10 VITALS — BP 132/76 | HR 88 | Ht 66.0 in | Wt 177.4 lb

## 2021-05-10 DIAGNOSIS — E7849 Other hyperlipidemia: Secondary | ICD-10-CM | POA: Diagnosis not present

## 2021-05-10 DIAGNOSIS — T466X5D Adverse effect of antihyperlipidemic and antiarteriosclerotic drugs, subsequent encounter: Secondary | ICD-10-CM | POA: Diagnosis not present

## 2021-05-10 DIAGNOSIS — T466X5A Adverse effect of antihyperlipidemic and antiarteriosclerotic drugs, initial encounter: Secondary | ICD-10-CM

## 2021-05-10 DIAGNOSIS — M791 Myalgia, unspecified site: Secondary | ICD-10-CM | POA: Diagnosis not present

## 2021-05-10 MED ORDER — NEXLETOL 180 MG PO TABS: 1.0000 | ORAL_TABLET | Freq: Every day | ORAL | 11 refills | Status: DC

## 2021-05-10 NOTE — Patient Instructions (Signed)
Medication Instructions:  START nexletol 180mg  daily   *If you need a refill on your cardiac medications before your next appointment, please call your pharmacy*   Lab Work: FASTING lab work to check cholesterol in 3-4 months   If you have labs (blood work) drawn today and your tests are completely normal, you will receive your results only by: [place] (if you have MyChart) OR A paper copy in the mail If you have any lab test that is abnormal or we need to change your treatment, we will call you to review the results.  Follow-Up: At [facility], you and your health needs are our priority.  As part of our continuing mission to provide you with exceptional heart care, we have created designated Provider Care Teams.  These Care Teams include your primary Cardiologist (physician) and Advanced Practice Providers (APPs -  Physician Assistants and Nurse Practitioners) who all work together to provide you with the care you need, when you need it.  We recommend signing up for the patient portal called "MyChart".  Sign up information is provided on this After Visit Summary.  MyChart is used to connect with patients for Virtual Visits (Telemedicine).  Patients are able to view lab/test results, encounter notes, upcoming appointments, etc.  Non-urgent messages can be sent to your provider as well.   To learn more about what you can do with MyChart, go to [email or web address].    Your next appointment:   3-4 months with Dr. Debara Pickett -- lipid clinic

## 2021-05-10 NOTE — Progress Notes (Signed)
LIPID CLINIC CONSULT NOTE  Chief Complaint:  Follow-up dyslipidemia  Primary Care Physician: Inda Coke, PA  Primary Cardiologist:  None  HPI:  Vanessa Harmon is a 70 y.o. female who is being seen today for the evaluation of dyslipidemia at the request of Inda Coke, [place].  This is a pleasant 71 year old female kindly referred for evaluation and management of dyslipidemia.  She has a history of longstanding dyslipidemia previously on atorvastatin however developed significant myalgias and had to discontinue the medicine.  Her LDL cholesterol had been much better controlled around 100 however recent labs in April off of medication showed total cholesterol 305, HDL 52, triglycerides 149 and LDL 223.  These findings are highly suggestive of a genetic dyslipidemia, probably familial hyperlipidemia.  She notes her mom had a stroke at early age (<50) and also had high cholesterol and she also had a brother with high cholesterol (unclear if greater than 190).  Based on the [employer] [facility] criteria, this would be considered probable familial hyperlipidemia.  Unfortunately, she also has statin intolerance, in addition was tried on rosuvastatin after the atorvastatin and could not tolerate it due to myalgias.  She was placed on ezetimibe.  05/10/2021  Ms. Vanessa Harmon is seen today in follow-up.  Cholesterol has improved significantly with PCSK9 inhibitor therapy.  Total now 208, triglycerides 215, HDL 57 and LDL 114 (down from 223 previously).  Despite this, however, ideally would like to see her cholesterol lower.  We did get an LP(a) which was low at 33.4.  Her LDL particle numbers still elevated at 1321.  We discussed this finding and possible options for additional therapy.  PMHx:  Past Medical History:  Diagnosis Date   Adenomatous polyp    Allergy    Anxiety    takes Ativan daily as needed   Arthritis    inflammatory arthritis (Dr. Dagoberto Ligas)   Asthma    Cataract     Chronic back pain    scoliosis    Constipation    takes miralax every other day   Depression    takes Lexapro daily   Eczema    Essential hypertension, benign    takes Hyzaar and Metoprolol daily   Family history of adverse reaction to anesthesia    Sister PONV   Fibromyalgia    GERD (gastroesophageal reflux disease)    History of colon polyps    History of shingles    Hyperlipidemia    takes Atorvastatin daily   Insomnia    takes Melatonin nightly   Joint pain    Osteoarthritis    Osteoporosis    PSVT (paroxysmal supraventricular tachycardia) (HCC)    Recurrent upper respiratory infection (URI)    Rheumatoid arthritis ([place])    sees Dr. Amil Amen; stable on Orencia   Rhinitis, allergic    uses Flonase daily   Urinary urgency    Urticaria     Past Surgical History:  Procedure Laterality Date   ADENOIDECTOMY     APPENDECTOMY     BACK SURGERY  2012   at age 48 d/t scoliosis   BLEPHAROPLASTY  2018   BREAST BIOPSY     cataract surgery     COLONOSCOPY     HARDWARE REMOVAL Left 02/24/2020   Procedure: HARDWARE REMOVAL LEFT DISTAL RADIUS;  Surgeon: Leanora Cover, MD;  Location: [place];  Service: Orthopedics;  Laterality: Left;   KNEE ARTHROSCOPY Bilateral    neuroma removed from foot  unsure of which foot   OPEN REDUCTION INTERNAL FIXATION (ORIF) DISTAL RADIAL FRACTURE Left 08/15/2019   Procedure: OPEN REDUCTION INTERNAL FIXATION (ORIF) DISTAL RADIAL FRACTURE;  Surgeon: Leanora Cover, MD;  Location: [place];  Service: Orthopedics;  Laterality: Left;  block in preop   POSTERIOR CERVICAL FUSION/FORAMINOTOMY N/A 05/29/2020   Procedure: Removal of hardware, Replacement of thoracic 8, thoracic 9, thoracic 10 screws, Right Thoracic 6-7, Right Thoracic 7-8 Laminectomies with thoracic hooks from Thoracic 4-5 to Thoracic 10;  Surgeon: Erline Levine, MD;  Location: [place];  Service: Neurosurgery;  Laterality: N/A;   POSTERIOR LUMBAR FUSION 4 LEVEL N/A  02/15/2018   Procedure: Decompression and fusion Thoracic nine to Lumbar two with exploration of previous fusion;  Surgeon: Erline Levine, MD;  Location: [place];  Service: Neurosurgery;  Laterality: N/A;   SHOULDER SURGERY Right    TONSILLECTOMY     TOTAL HIP ARTHROPLASTY Right 04/15/2014   Procedure: RIGHT TOTAL HIP ARTHROPLASTY ANTERIOR APPROACH;  Surgeon: Renette Butters, MD;  Location: [place];  Service: Orthopedics;  Laterality: Right;   TOTAL KNEE ARTHROPLASTY Left 11/26/2019   Procedure: TOTAL KNEE ARTHROPLASTY;  Surgeon: Paralee Cancel, MD;  Location: [facility];  Service: Orthopedics;  Laterality: Left;  70 mins    FAMHx:  Family History  Problem Relation Age of Onset   Stroke Mother    Allergic rhinitis Mother    Allergic rhinitis Father    AAA (abdominal aortic aneurysm) Father    COPD Father    Prostate cancer Father    Hypertension Other        unspecified grandmother   AAA (abdominal aortic aneurysm) Other        unspecified grandfather   Cancer Other        unspecified grandfather   Neuropathy Neg Hx     SOCHx:   reports that she quit smoking about 25 years ago. Her smoking use included cigarettes. She has never used smokeless tobacco. She reports current alcohol use of about 1.0 standard drink per week. She reports that she does not use drugs.  ALLERGIES:  Allergies  Allergen Reactions   Advair Hfa [Fluticasone-Salmeterol] Other (See Comments)    Hoarness   Statins Other (See Comments)   Breo Ellipta [Fluticasone Furoate-Vilanterol] Other (See Comments)    Severe hoarseness    Fosamax [Alendronate Sodium] Other (See Comments)    myalgias   Levaquin [Levofloxacin] Other (See Comments)    Lightheadedness, not feeling well, ear pain   Doxycycline Hyclate    Infliximab Other (See Comments)    Skin reaction   Spiriva Handihaler [Tiotropium Bromide Monohydrate]     UNSPECIFIED REACTION    Verapamil    Tape Rash    PAPER TAPE: Causes severe rash and weeping     ROS: Pertinent items noted in HPI and remainder of comprehensive ROS otherwise negative.  HOME MEDS: Current Outpatient Medications on File Prior to Visit  Medication Sig Dispense Refill   abatacept (ORENCIA) 250 MG injection Inject 250 mg into the vein every 30 (thirty) days.     albuterol (PROAIR HFA) 108 (90 Base) MCG/ACT inhaler Inhale two puffs every 4-6 hours if needed for cough or wheeze. (Patient taking differently: Inhale 2 puffs into the lungs every 4 (four) hours as needed for wheezing or shortness of breath. Inhale two puffs every 4-6 hours if needed for cough or wheeze.) 8.5 Inhaler 1   amLODipine (NORVASC) 5 MG tablet      aspirin EC 81  MG tablet Take 1 tablet (81 mg total) by mouth daily. Swallow whole. 30 tablet 11   azelastine (ASTELIN) 0.1 % nasal spray Place 1 spray into both nostrils 2 (two) times daily. Use in each nostril as directed     budesonide (PULMICORT) 0.5 MG/2ML nebulizer solution Inhale into the lungs.     Calcium Carb-Cholecalciferol (CALCIUM 600/VITAMIN D3 PO) Take 1 tablet by mouth daily.     carboxymethylcellulose (REFRESH PLUS) 0.5 % SOLN Place 1 drop into both eyes 3 (three) times daily as needed (dry eyes).      Cetirizine HCl (ZYRTEC ALLERGY) 10 MG CAPS Zyrtec 10 mg capsule     Coenzyme Q10 (CO Q-10) 100 MG CAPS      denosumab (PROLIA) 60 MG/ML SOSY injection Inject 60 mg into the skin every 6 (six) months.     diphenhydrAMINE (BENADRYL) 25 MG tablet Take 50 mg by mouth daily as needed for allergies.     EPINEPHrine 0.3 mg/0.3 mL IJ SOAJ injection epinephrine 0.3 mg/0.3 mL injection, auto-injector     escitalopram (LEXAPRO) 10 MG tablet TAKE ONE TABLET BY MOUTH DAILY 90 tablet 0   Evolocumab (REPATHA SURECLICK) 338 MG/ML SOAJ Inject 1 Dose into the skin every 14 (fourteen) days. 2 mL 11   ezetimibe (ZETIA) 10 MG tablet Take 1 tablet (10 mg total) by mouth daily. 90 tablet 1   famotidine (PEPCID) 40 MG tablet TAKE 1 TABLET (40 MG TOTAL) BY MOUTH AT  BEDTIME. 30 tablet 5   fluticasone (FLOVENT HFA) 220 MCG/ACT inhaler Inhale 2 puffs into the lungs 2 (two) times daily.     folic acid (FOLVITE) 1 MG tablet 1 tablet     furosemide (LASIX) 40 MG tablet Take by mouth.     gabapentin (NEURONTIN) 300 MG capsule Take 300 mg by mouth at bedtime.     guaiFENesin (MUCINEX) 600 MG 12 hr tablet Take 600 mg by mouth 2 (two) times daily as needed for cough or to loosen phlegm.     HYDROcodone-acetaminophen (NORCO) 10-325 MG tablet 1 tablet as needed     indomethacin (INDOCIN SR) 75 MG CR capsule TAKE ONE CAPSULE BY MOUTH DAILY WITH BREAKFAST 90 capsule 1   ipratropium (ATROVENT) 0.03 % nasal spray Place 1 spray into both nostrils 2 (two) times daily as needed for rhinitis.     irbesartan (AVAPRO) 300 MG tablet Take 1 tablet (300 mg total) by mouth daily. 90 tablet 1   leucovorin (WELLCOVORIN) 5 MG tablet 1 tablet     Lifitegrast (XIIDRA) 5 % SOLN Place 1 drop into both eyes in the morning and at bedtime.      Magnesium 400 MG CAPS      methocarbamol (ROBAXIN) 500 MG tablet Take 1 tablet (500 mg total) by mouth every 6 (six) hours as needed for muscle spasms. 60 tablet 2   Methotrexate 15 MG/0.6ML SOSY      montelukast (SINGULAIR) 10 MG tablet Take 1 tablet (10 mg total) by mouth at bedtime. 30 tablet 5   Multiple Vitamin (MULTIVITAMIN) capsule Take 1 capsule by mouth daily.     Needles & Syringes (1CC TB SYRINGE) MISC 27g 1/2"     Omega-3 Fatty Acids (FISH OIL PO) Take 1,000 mg by mouth daily.     omeprazole (PRILOSEC) 40 MG capsule TAKE ONE CAPSULE BY MOUTH DAILY BEFORE BREAKFAST DAILY (Patient taking differently: Take 40 mg by mouth daily. TAKE ONE CAPSULE BY MOUTH DAILY BEFORE BREAKFAST DAILY) 30 capsule 4  OVER THE COUNTER MEDICATION Place 1 application into both eyes at bedtime. Genteal overnight eye ointment     polyethylene glycol powder (GLYCOLAX/MIRALAX) 17 GM/SCOOP powder Miralax 17 gram/dose oral powder     predniSONE (DELTASONE) 5 MG tablet  Take 5 mg by mouth daily with breakfast.      PRESCRIPTION MEDICATION Place 1 spray into the nose in the morning and at bedtime. Budesonide 0.5 mg / 2 mL Nasal rinse     pseudoephedrine (SUDAFED) 30 MG tablet Take 30 mg by mouth every 4 (four) hours as needed for congestion.     traZODone (DESYREL) 150 MG tablet Take 1 tablet (150 mg total) by mouth daily. 90 tablet 1   valACYclovir (VALTREX) 1000 MG tablet Take 1,000 mg by mouth daily.      vitamin C (ASCORBIC ACID) 500 MG tablet Take 500 mg by mouth daily.     No current facility-administered medications on file prior to visit.    LABS/IMAGING: No results found for this or any previous visit (from the past 48 hour(s)). No results found.  LIPID PANEL:    Component Value Date/Time   CHOL 305 (H) 07/29/2020 1100   TRIG 149.0 07/29/2020 1100   HDL 52.50 07/29/2020 1100   CHOLHDL 6 07/29/2020 1100   VLDL 29.8 07/29/2020 1100   LDLCALC 223 (H) 07/29/2020 1100   LDLCALC 126 (H) 04/13/2017 1201    WEIGHTS: Wt Readings from Last 3 Encounters:  05/10/21 177 lb 6.4 oz (80.5 kg)  01/22/21 171 lb 6.1 oz (77.7 kg)  01/12/21 174 lb (78.9 kg)    VITALS: BP 132/76    Pulse 88    Ht 5\' 6"  (1.676 m)    Wt 177 lb 6.4 oz (80.5 kg)    SpO2 98%    BMI 28.63 kg/m   EXAM: Deferred  EKG: N/A  ASSESSMENT: Probable familial hyperlipidemia, LDL greater than 190 (by Baruch Merl criteria Family history of premature stroke in her mother (earlier than age 68) Marked dyslipidemia in her mother and brother Statin intolerance-myalgias, on ezetimibe Rheumatoid arthritis  PLAN: 1.   Ms. Eber has had marked improvement in her lipids but still remains above a target.  Options for additional therapy are limited.  She is on ezetimibe.  We discussed the possibility of adding Nexletol to her therapy.  This could give [place] another 20 to 25% reduction in her lipids and ultimately be combined with her Zetia as Nexlizet, which could help with compliance.   She seemed interested in this.  We will provide a couple weeks of samples and if well-tolerated pursue Nexlizet therapy.  Plan repeat lipids in 3 months and follow-up at that time.  Pixie Casino, MD, [facility], [place] Director of the [facility] Lipid Disorders &  Cardiovascular Risk Reduction Clinic Diplomate of the American Board of Clinical Lipidology Attending Cardiologist  Direct Dial: [phone]   Fax: [phone]  Website:  [email or web address]  Pixie Casino 05/10/2021, 2:39 PM

## 2021-05-11 ENCOUNTER — Telehealth: Payer: Self-pay | Admitting: Internal Medicine

## 2021-05-11 ENCOUNTER — Telehealth: Payer: Self-pay

## 2021-05-11 NOTE — Telephone Encounter (Signed)
PA for nexletol submitted via CMM (Key: [id number])

## 2021-05-11 NOTE — Telephone Encounter (Signed)
PA Case ID: [id number] - Rx #: [id number]  is approved through 04/17/2022.

## 2021-05-12 NOTE — Telephone Encounter (Signed)
NEXLETOL TAB 180MG , use as directed, is approved for non-formulary exception through 04/17/2022 under your Medicare Part D benefit.

## 2021-05-24 LAB — HM MAMMOGRAPHY

## 2021-06-01 ENCOUNTER — Ambulatory Visit (HOSPITAL_BASED_OUTPATIENT_CLINIC_OR_DEPARTMENT_OTHER): Payer: Medicare Other | Admitting: Internal Medicine

## 2021-06-07 ENCOUNTER — Encounter: Payer: Self-pay | Admitting: Physician Assistant

## 2021-06-15 ENCOUNTER — Telehealth: Payer: Self-pay | Admitting: Internal Medicine

## 2021-06-15 NOTE — Telephone Encounter (Signed)
PA for repatha submitted via CMM (Key: [id number])

## 2021-06-15 NOTE — Telephone Encounter (Signed)
Request Reference Number: [id number]. REPATHA SURE INJ 140MG /ML is approved through 04/17/2022

## 2021-06-16 ENCOUNTER — Encounter: Payer: Self-pay | Admitting: Physician Assistant

## 2021-06-16 DIAGNOSIS — I83893 Varicose veins of bilateral lower extremities with other complications: Secondary | ICD-10-CM

## 2021-06-16 NOTE — Telephone Encounter (Signed)
Spoke to pt asked if varicose veins are in both legs? Pt said yes. Told her I will place the referral and someone will contact you to schedule an appointment. Pt verbalized understanding. Referral placed. ?

## 2021-06-16 NOTE — Telephone Encounter (Signed)
Please advise if you'd ike me to place a referral ?

## 2021-06-21 ENCOUNTER — Other Ambulatory Visit: Payer: Self-pay | Admitting: Physician Assistant

## 2021-06-30 ENCOUNTER — Encounter: Payer: Self-pay | Admitting: Vascular Surgery

## 2021-06-30 ENCOUNTER — Ambulatory Visit: Payer: Medicare Other | Admitting: Vascular Surgery

## 2021-06-30 ENCOUNTER — Other Ambulatory Visit: Payer: Self-pay

## 2021-06-30 VITALS — BP 134/83 | HR 58 | Temp 98.3°F | Resp 14 | Ht 66.0 in | Wt 175.0 lb

## 2021-06-30 DIAGNOSIS — I872 Venous insufficiency (chronic) (peripheral): Secondary | ICD-10-CM

## 2021-06-30 NOTE — Progress Notes (Signed)
? ?ASSESSMENT & PLAN  ? ?CHRONIC VENOUS INSUFFICIENCY: Based on her exam I think she likely does have some underlying venous insufficiency but she did not have formal venous reflux testing today.  We have discussed the importance of intermittent leg elevation and the proper positioning for this.  We discussed potentially trying a thigh-high compression stocking with a gradient of 20 to 30 mmHg.  She would like to think about this before purchasing stockings.  I have encouraged her to avoid prolonged sitting and standing.  We discussed the importance of exercise specifically walking and water aerobics.  We also discussed the importance of maintaining a healthy weight.  If her symptoms progress that I think we should try thigh-high stockings.  If her symptoms progress I think we should obtain formal venous reflux testing.  Her symptoms are worse on the right side so we will start on the right side.  She will call if her symptoms progress. ? ?REASON FOR CONSULT:   ? ?Leg swelling and varicose veins.  The consult is requested by Inda Coke, PA.  ? ?HPI:  ? ?Vanessa Harmon is a 71 y.o. female who was referred for vascular consultation.  She had been complaining of swelling in both legs and varicose veins and was sent for vascular consultation. ? ?On my history, the patient has had swelling in both lower extremities for about 5 years.  Her swelling has been relatively stable.  Her swelling is typically worse on the right side.  She does not have significant pain associated with the swelling.  She has had no previous history of DVT.  She has had no previous venous procedures.  She does wear knee-high compression stockings.  Some have a gradient of 15-20 and some have a gradient of 20-30.  These do help her swelling.  She also elevates her legs some which does help her swelling. ? ?She has had 5 previous back operations.  For this reason it hurts when she stands that she does not stand a lot.  Her swelling is worse  when she has been sitting for prolonged period of time. ? ?Past Medical History:  ?Diagnosis Date  ? Adenomatous polyp   ? Allergy   ? Anxiety   ? takes Ativan daily as needed  ? Arthritis   ? inflammatory arthritis (Dr. Dagoberto Ligas)  ? Asthma   ? Cataract   ? Chronic back pain   ? scoliosis   ? Constipation   ? takes miralax every other day  ? Depression   ? takes Lexapro daily  ? Eczema   ? Essential hypertension, benign   ? takes Hyzaar and Metoprolol daily  ? Family history of adverse reaction to anesthesia   ? Sister PONV  ? Fibromyalgia   ? GERD (gastroesophageal reflux disease)   ? History of colon polyps   ? History of shingles   ? Hyperlipidemia   ? takes Atorvastatin daily  ? Insomnia   ? takes Melatonin nightly  ? Joint pain   ? Osteoarthritis   ? Osteoporosis   ? PSVT (paroxysmal supraventricular tachycardia) ([place])   ? Recurrent upper respiratory infection (URI)   ? Rheumatoid arthritis ([place])   ? sees Dr. Amil Amen; stable on Orencia  ? Rhinitis, allergic   ? uses Flonase daily  ? Urinary urgency   ? Urticaria   ? ? ?Family History  ?Problem Relation Age of Onset  ? Stroke Mother   ? Allergic rhinitis Mother   ? Allergic  rhinitis Father   ? AAA (abdominal aortic aneurysm) Father   ? COPD Father   ? Prostate cancer Father   ? Hypertension Other   ?     unspecified grandmother  ? AAA (abdominal aortic aneurysm) Other   ?     unspecified grandfather  ? Cancer Other   ?     unspecified grandfather  ? Neuropathy Neg Hx   ? ? ?SOCIAL HISTORY: ?Social History  ? ?Tobacco Use  ? Smoking status: Former  ?  Types: Cigarettes  ?  Quit date: 1998  ?  Years since quitting: 25.2  ? Smokeless tobacco: Never  ? Tobacco comments:  ?  hasn't smoked in 44yr  ?Substance Use Topics  ? Alcohol use: Yes  ?  Alcohol/week: 1.0 standard drink  ?  Types: 1 Glasses of wine per week  ?  Comment: rarely with dinner  ? ? ?Allergies  ?Allergen Reactions  ? Advair Hfa [Fluticasone-Salmeterol] Other (See Comments)  ?  Hoarness  ?  Statins Other (See Comments)  ? Breo Ellipta [Fluticasone Furoate-Vilanterol] Other (See Comments)  ?  Severe hoarseness   ? Fosamax [Alendronate Sodium] Other (See Comments)  ?  myalgias  ? Levaquin [Levofloxacin] Other (See Comments)  ?  Lightheadedness, not feeling well, ear pain  ? Doxycycline Hyclate   ? Infliximab Other (See Comments)  ?  Skin reaction  ? Spiriva Handihaler [Tiotropium Bromide Monohydrate]   ?  UNSPECIFIED REACTION   ? Verapamil   ? Tape Rash  ?  PAPER TAPE: Causes severe rash and weeping  ? ? ?Current Outpatient Medications  ?Medication Sig Dispense Refill  ? abatacept (ORENCIA) 250 MG injection Inject 250 mg into the vein every 30 (thirty) days.    ? albuterol (PROAIR HFA) 108 (90 Base) MCG/ACT inhaler Inhale two puffs every 4-6 hours if needed for cough or wheeze. (Patient taking differently: Inhale 2 puffs into the lungs every 4 (four) hours as needed for wheezing or shortness of breath. Inhale two puffs every 4-6 hours if needed for cough or wheeze.) 8.5 Inhaler 1  ? amLODipine (NORVASC) 5 MG tablet     ? aspirin EC 81 MG tablet Take 1 tablet (81 mg total) by mouth daily. Swallow whole. 30 tablet 11  ? azelastine (ASTELIN) 0.1 % nasal spray Place 1 spray into both nostrils 2 (two) times daily. Use in each nostril as directed    ? Bempedoic Acid (NEXLETOL) 180 MG TABS Take 1 Dose by mouth daily. 30 tablet 11  ? budesonide (PULMICORT) 0.5 MG/2ML nebulizer solution Inhale into the lungs.    ? Calcium Carb-Cholecalciferol (CALCIUM 600/VITAMIN D3 PO) Take 1 tablet by mouth daily.    ? carboxymethylcellulose (REFRESH PLUS) 0.5 % SOLN Place 1 drop into both eyes 3 (three) times daily as needed (dry eyes).     ? Cetirizine HCl (ZYRTEC ALLERGY) 10 MG CAPS Zyrtec 10 mg capsule    ? Coenzyme Q10 (CO Q-10) 100 MG CAPS     ? denosumab (PROLIA) 60 MG/ML SOSY injection Inject 60 mg into the skin every 6 (six) months.    ? diphenhydrAMINE (BENADRYL) 25 MG tablet Take 50 mg by mouth daily as needed for  allergies.    ? EPINEPHrine 0.3 mg/0.3 mL IJ SOAJ injection epinephrine 0.3 mg/0.3 mL injection, auto-injector    ? escitalopram (LEXAPRO) 10 MG tablet TAKE ONE TABLET BY MOUTH DAILY 90 tablet 0  ? Evolocumab (REPATHA SURECLICK) 1528MG/ML SOAJ Inject 1 Dose into the  skin every 14 (fourteen) days. 2 mL 11  ? ezetimibe (ZETIA) 10 MG tablet Take 1 tablet (10 mg total) by mouth daily. 90 tablet 1  ? famotidine (PEPCID) 40 MG tablet TAKE 1 TABLET (40 MG TOTAL) BY MOUTH AT BEDTIME. 30 tablet 5  ? fluticasone (FLOVENT HFA) 220 MCG/ACT inhaler Inhale 2 puffs into the lungs 2 (two) times daily.    ? furosemide (LASIX) 40 MG tablet Take by mouth.    ? gabapentin (NEURONTIN) 300 MG capsule Take 300 mg by mouth at bedtime.    ? guaiFENesin (MUCINEX) 600 MG 12 hr tablet Take 600 mg by mouth 2 (two) times daily as needed for cough or to loosen phlegm.    ? HYDROcodone-acetaminophen (NORCO) 10-325 MG tablet 1 tablet as needed    ? indomethacin (INDOCIN SR) 75 MG CR capsule TAKE ONE CAPSULE BY MOUTH DAILY WITH BREAKFAST 90 capsule 1  ? ipratropium (ATROVENT) 0.03 % nasal spray Place 1 spray into both nostrils 2 (two) times daily as needed for rhinitis.    ? irbesartan (AVAPRO) 300 MG tablet Take 1 tablet (300 mg total) by mouth daily. 90 tablet 1  ? leucovorin (WELLCOVORIN) 5 MG tablet 1 tablet    ? Lifitegrast (XIIDRA) 5 % SOLN Place 1 drop into both eyes in the morning and at bedtime.     ? Magnesium 400 MG CAPS     ? methocarbamol (ROBAXIN) 500 MG tablet Take 1 tablet (500 mg total) by mouth every 6 (six) hours as needed for muscle spasms. 60 tablet 2  ? Methotrexate 15 MG/0.6ML SOSY     ? montelukast (SINGULAIR) 10 MG tablet Take 1 tablet (10 mg total) by mouth at bedtime. 30 tablet 5  ? Multiple Vitamin (MULTIVITAMIN) capsule Take 1 capsule by mouth daily.    ? Needles & Syringes (1CC TB SYRINGE) MISC 27g 1/2"    ? Omega-3 Fatty Acids (FISH OIL PO) Take 1,000 mg by mouth daily.    ? omeprazole (PRILOSEC) 40 MG capsule TAKE ONE  CAPSULE BY MOUTH DAILY BEFORE BREAKFAST DAILY (Patient taking differently: Take 40 mg by mouth daily. TAKE ONE CAPSULE BY MOUTH DAILY BEFORE BREAKFAST DAILY) 30 capsule 4  ? OVER THE COUNTER MEDICATION P

## 2021-07-01 ENCOUNTER — Other Ambulatory Visit: Payer: Self-pay | Admitting: Physician Assistant

## 2021-07-08 ENCOUNTER — Ambulatory Visit (INDEPENDENT_AMBULATORY_CARE_PROVIDER_SITE_OTHER): Payer: Medicare Other

## 2021-07-08 ENCOUNTER — Other Ambulatory Visit: Payer: Self-pay

## 2021-07-08 DIAGNOSIS — Z0000 Encounter for general adult medical examination without abnormal findings: Secondary | ICD-10-CM

## 2021-07-08 NOTE — Progress Notes (Signed)
Virtual Visit via Telephone Note ? ?I connected with  Vanessa Harmon on 07/08/21 at  1:30 PM EDT by telephone and verified that I am speaking with the correct person using two identifiers. ? ?Medicare Annual Wellness visit completed telephonically due to Covid-19 pandemic.  ? ?Persons participating in this call: This Health Coach and this patient.  ? ?Location: ?Patient: Home ?Provider: Office ?  ?I discussed the limitations, risks, security and privacy concerns of performing an evaluation and management service by telephone and the availability of in person appointments. The patient expressed understanding and agreed to proceed. ? ?Unable to perform video visit due to video visit attempted and failed and/or patient does not have video capability.  ? ?Some vital signs may be absent or patient reported.  ? ?Willette Brace, LPN ? ? ?Subjective:  ? Vanessa Harmon is a 71 y.o. female who presents for Medicare Annual (Subsequent) preventive examination. ? ?Review of Systems    ? ?Cardiac Risk Factors include: advanced age (>47mn, >>92women);dyslipidemia;hypertension ? ?   ?Objective:  ?  ?There were no vitals filed for this visit. ?There is no height or weight on file to calculate BMI. ? ? ?  07/08/2021  ?  1:38 PM 05/27/2020  ?  1:22 PM 02/24/2020  ? 11:49 AM 02/18/2020  ?  4:48 PM 11/26/2019  ?  2:35 PM 11/26/2019  ?  8:20 AM 11/21/2019  ?  1:22 PM  ?Advanced Directives  ?Does Patient Have a Medical Advance Directive? Yes Yes Yes Yes Yes Yes Yes  ?Type of [place]of [place] will [place]enter [place]Living will [place]Living will [place]Living will [place]Living will Living will;Healthcare Power of Attorney  ?Does patient want to make changes to medical advance directive?  No - Patient declined No - Patient declined No - Patient declined No - Patient declined  No - Patient declined   ?Copy of [place] Chart? No - copy requested Yes - validated most recent copy scanned in chart (See row information) No - copy requested    No - copy requested  ? ? ?Current Medications (verified) ?Outpatient Encounter Medications as of 07/08/2021  ?Medication Sig  ? abatacept (ORENCIA) 250 MG injection Inject 250 mg into the vein every 30 (thirty) days.  ? albuterol (PROAIR HFA) 108 (90 Base) MCG/ACT inhaler Inhale two puffs every 4-6 hours if needed for cough or wheeze. (Patient taking differently: Inhale 2 puffs into the lungs every 4 (four) hours as needed for wheezing or shortness of breath. Inhale two puffs every 4-6 hours if needed for cough or wheeze.)  ? amLODipine (NORVASC) 5 MG tablet   ? aspirin EC 81 MG tablet Take 1 tablet (81 mg total) by mouth daily. Swallow whole.  ? azelastine (ASTELIN) 0.1 % nasal spray Place 1 spray into both nostrils 2 (two) times daily. Use in each nostril as directed  ? Bempedoic Acid (NEXLETOL) 180 MG TABS Take 1 Dose by mouth daily.  ? budesonide (PULMICORT) 0.5 MG/2ML nebulizer solution Inhale into the lungs.  ? Calcium Carb-Cholecalciferol (CALCIUM 600/VITAMIN D3 PO) Take 1 tablet by mouth daily.  ? carboxymethylcellulose (REFRESH PLUS) 0.5 % SOLN Place 1 drop into both eyes 3 (three) times daily as needed (dry eyes).   ? Cetirizine HCl (ZYRTEC ALLERGY) 10 MG CAPS Zyrtec 10 mg capsule  ? Coenzyme Q10 (CO Q-10) 100 MG CAPS   ?  denosumab (PROLIA) 60 MG/ML SOSY injection Inject 60 mg into the skin every 6 (six) months.  ? diphenhydrAMINE (BENADRYL) 25 MG tablet Take 50 mg by mouth daily as needed for allergies.  ? EPINEPHrine 0.3 mg/0.3 mL IJ SOAJ injection epinephrine 0.3 mg/0.3 mL injection, auto-injector  ? escitalopram (LEXAPRO) 10 MG tablet TAKE ONE TABLET BY MOUTH DAILY  ? Evolocumab (REPATHA SURECLICK) 354 MG/ML SOAJ Inject 1 Dose into the skin every 14 (fourteen) days.  ? ezetimibe (ZETIA) 10 MG tablet TAKE ONE TABLET BY MOUTH DAILY  ?  famotidine (PEPCID) 40 MG tablet TAKE 1 TABLET (40 MG TOTAL) BY MOUTH AT BEDTIME.  ? fluticasone (FLOVENT HFA) 220 MCG/ACT inhaler Inhale 2 puffs into the lungs 2 (two) times daily.  ? furosemide (LASIX) 40 MG tablet Take by mouth.  ? gabapentin (NEURONTIN) 300 MG capsule Take 300 mg by mouth at bedtime.  ? guaiFENesin (MUCINEX) 600 MG 12 hr tablet Take 600 mg by mouth 2 (two) times daily as needed for cough or to loosen phlegm.  ? HYDROcodone-acetaminophen (NORCO) 10-325 MG tablet 1 tablet as needed  ? indomethacin (INDOCIN SR) 75 MG CR capsule TAKE ONE CAPSULE BY MOUTH DAILY WITH BREAKFAST  ? ipratropium (ATROVENT) 0.03 % nasal spray Place 1 spray into both nostrils 2 (two) times daily as needed for rhinitis.  ? irbesartan (AVAPRO) 300 MG tablet Take 1 tablet (300 mg total) by mouth daily.  ? leucovorin (WELLCOVORIN) 5 MG tablet 1 tablet  ? Lifitegrast (XIIDRA) 5 % SOLN Place 1 drop into both eyes in the morning and at bedtime.   ? Magnesium 400 MG CAPS   ? methocarbamol (ROBAXIN) 500 MG tablet Take 1 tablet (500 mg total) by mouth every 6 (six) hours as needed for muscle spasms.  ? Methotrexate 15 MG/0.6ML SOSY   ? montelukast (SINGULAIR) 10 MG tablet Take 1 tablet (10 mg total) by mouth at bedtime.  ? Multiple Vitamin (MULTIVITAMIN) capsule Take 1 capsule by mouth daily.  ? Needles & Syringes (1CC TB SYRINGE) MISC 27g 1/2"  ? Omega-3 Fatty Acids (FISH OIL PO) Take 1,000 mg by mouth daily.  ? omeprazole (PRILOSEC) 40 MG capsule TAKE ONE CAPSULE BY MOUTH DAILY BEFORE BREAKFAST DAILY (Patient taking differently: Take 40 mg by mouth daily. TAKE ONE CAPSULE BY MOUTH DAILY BEFORE BREAKFAST DAILY)  ? OVER THE COUNTER MEDICATION Place 1 application into both eyes at bedtime. Genteal overnight eye ointment  ? polyethylene glycol powder (GLYCOLAX/MIRALAX) 17 GM/SCOOP powder Miralax 17 gram/dose oral powder  ? predniSONE (DELTASONE) 5 MG tablet Take 5 mg by mouth daily with breakfast.   ? PRESCRIPTION MEDICATION Place 1  spray into the nose in the morning and at bedtime. Budesonide 0.5 mg / 2 mL Nasal rinse  ? pseudoephedrine (SUDAFED) 30 MG tablet Take 30 mg by mouth every 4 (four) hours as needed for congestion.  ? traZODone (DESYREL) 150 MG tablet TAKE ONE TABLET BY MOUTH DAILY  ? valACYclovir (VALTREX) 1000 MG tablet Take 1,000 mg by mouth daily.   ? vitamin C (ASCORBIC ACID) 500 MG tablet Take 500 mg by mouth daily.  ? [DISCONTINUED] folic acid (FOLVITE) 1 MG tablet 1 tablet  ? ?No facility-administered encounter medications on file as of 07/08/2021.  ? ? ?Allergies (verified) ?Advair hfa [fluticasone-salmeterol], Statins, Breo ellipta [fluticasone furoate-vilanterol], Fosamax [alendronate sodium], Levaquin [levofloxacin], Doxycycline hyclate, Infliximab, Spiriva handihaler [tiotropium bromide monohydrate], Verapamil, and Tape  ? ?History: ?Past Medical History:  ?Diagnosis Date  ? Adenomatous polyp   ?  Allergy   ? Anxiety   ? takes Ativan daily as needed  ? Arthritis   ? inflammatory arthritis (Dr. Dagoberto Ligas)  ? Asthma   ? Cataract   ? Chronic back pain   ? scoliosis   ? Constipation   ? takes miralax every other day  ? Depression   ? takes Lexapro daily  ? Eczema   ? Essential hypertension, benign   ? takes Hyzaar and Metoprolol daily  ? Family history of adverse reaction to anesthesia   ? Sister PONV  ? Fibromyalgia   ? GERD (gastroesophageal reflux disease)   ? History of colon polyps   ? History of shingles   ? Hyperlipidemia   ? takes Atorvastatin daily  ? Insomnia   ? takes Melatonin nightly  ? Joint pain   ? Osteoarthritis   ? Osteoporosis   ? PSVT (paroxysmal supraventricular tachycardia) ([place])   ? Recurrent upper respiratory infection (URI)   ? Rheumatoid arthritis ([place])   ? sees Dr. Amil Amen; stable on Orencia  ? Rhinitis, allergic   ? uses Flonase daily  ? Urinary urgency   ? Urticaria   ? ?Past Surgical History:  ?Procedure Laterality Date  ? ADENOIDECTOMY    ? APPENDECTOMY    ? BACK SURGERY  2012  ? at age  80 d/t scoliosis  ? BLEPHAROPLASTY  2018  ? BREAST BIOPSY    ? cataract surgery    ? COLONOSCOPY    ? HARDWARE REMOVAL Left 02/24/2020  ? Procedure: HARDWARE REMOVAL LEFT DISTAL RADIUS;  Surgeon: Leanora Cover, MD;  L

## 2021-07-08 NOTE — Patient Instructions (Addendum)
Ms. Sarie , ?Thank you for taking time to come for your Medicare Wellness Visit. I appreciate your ongoing commitment to your health goals. Please review the following plan we discussed and let me know if I can assist you in the future.  ? ?Screening recommendations/referrals: ?Colonoscopy: Done 03/08/10 repeat every 10 years due as of 03/08/20 ?Mammogram: Done 05/24/21 repeat every year ?Bone Density: Done 04/27/17 repeat every 2 years  ?Recommended yearly ophthalmology/optometry visit for glaucoma screening and checkup ?Recommended yearly dental visit for hygiene and checkup ? ?Vaccinations: ?Influenza vaccine: Done 02/24/21 repeat every year  ?Pneumococcal vaccine: Up to date ?Tdap vaccine: Due 10/03/21 to be completed every 10 years  ?Shingles vaccine: Completed 10/13/17, 01/02/18   ?Covid-19:Completed 2/6, 2/27, 02/05/20 & 08/03/20 ? ?Advanced directives: Please bring a copy of your health care power of attorney and living will to the office at your convenience. ? ? ?Conditions/risks identified: None at this time  ? ?Next appointment: Follow up in one year for your annual wellness visit  ? ? ?Preventive Care 13 Years and Older, Female ?Preventive care refers to lifestyle choices and visits with your health care provider that can promote health and wellness. ?What does preventive care include? ?A yearly physical exam. This is also called an annual well check. ?Dental exams once or twice a year. ?Routine eye exams. Ask your health care provider how often you should have your eyes checked. ?Personal lifestyle choices, including: ?Daily care of your teeth and gums. ?Regular physical activity. ?Eating a healthy diet. ?Avoiding tobacco and drug use. ?Limiting alcohol use. ?Practicing safe sex. ?Taking low-dose aspirin every day. ?Taking vitamin and mineral supplements as recommended by your health care provider. ?What happens during an annual well check? ?The services and screenings done by your health care provider  during your annual well check will depend on your age, overall health, lifestyle risk factors, and family history of disease. ?Counseling  ?Your health care provider may ask you questions about your: ?Alcohol use. ?Tobacco use. ?Drug use. ?Emotional well-being. ?Home and relationship well-being. ?Sexual activity. ?Eating habits. ?History of falls. ?Memory and ability to understand (cognition). ?Work and work Statistician. ?Reproductive health. ?Screening  ?You may have the following tests or measurements: ?Height, weight, and BMI. ?Blood pressure. ?Lipid and cholesterol levels. These may be checked every 5 years, or more frequently if you are over 40 years old. ?Skin check. ?Lung cancer screening. You may have this screening every year starting at age 62 if you have a 30-pack-year history of smoking and currently smoke or have quit within the past 15 years. ?Fecal occult blood test (FOBT) of the stool. You may have this test every year starting at age 87. ?Flexible sigmoidoscopy or colonoscopy. You may have a sigmoidoscopy every 5 years or a colonoscopy every 10 years starting at age 34. ?Hepatitis C blood test. ?Hepatitis B blood test. ?Sexually transmitted disease (STD) testing. ?Diabetes screening. This is done by checking your blood sugar (glucose) after you have not eaten for a while (fasting). You may have this done every 1-3 years. ?Bone density scan. This is done to screen for osteoporosis. You may have this done starting at age 80. ?Mammogram. This may be done every 1-2 years. Talk to your health care provider about how often you should have regular mammograms. ?Talk with your health care provider about your test results, treatment options, and if necessary, the need for more tests. ?Vaccines  ?Your health care provider may recommend certain vaccines, such as: ?Influenza vaccine. This  is recommended every year. ?Tetanus, diphtheria, and acellular pertussis (Tdap, Td) vaccine. You may need a Td booster every  10 years. ?Zoster vaccine. You may need this after age 4. ?Pneumococcal 13-valent conjugate (PCV13) vaccine. One dose is recommended after age 25. ?Pneumococcal polysaccharide (PPSV23) vaccine. One dose is recommended after age 33. ?Talk to your health care provider about which screenings and vaccines you need and how often you need them. ?This information is not intended to replace advice given to you by your health care provider. Make sure you discuss any questions you have with your health care provider. ?Document Released: 05/01/2015 Document Revised: 12/23/2015 Document Reviewed: 02/03/2015 ?Elsevier Interactive Patient Education ? 2017 [place]. ? ?Fall Prevention in the Home ?Falls can cause injuries. They can happen to people of all ages. There are many things you can do to make your home safe and to help prevent falls. ?What can I do on the outside of my home? ?Regularly fix the edges of walkways and driveways and fix any cracks. ?Remove anything that might make you trip as you walk through a door, such as a raised step or threshold. ?Trim any bushes or trees on the path to your home. ?Use bright outdoor lighting. ?Clear any walking paths of anything that might make someone trip, such as rocks or tools. ?Regularly check to see if handrails are loose or broken. Make sure that both sides of any steps have handrails. ?Any raised decks and porches should have guardrails on the edges. ?Have any leaves, snow, or ice cleared regularly. ?Use sand or salt on walking paths during winter. ?Clean up any spills in your garage right away. This includes oil or grease spills. ?What can I do in the bathroom? ?Use night lights. ?Install grab bars by the toilet and in the tub and shower. Do not use towel bars as grab bars. ?Use non-skid mats or decals in the tub or shower. ?If you need to sit down in the shower, use a plastic, non-slip stool. ?Keep the floor dry. Clean up any water that spills on the floor as soon as it  happens. ?Remove soap buildup in the tub or shower regularly. ?Attach bath mats securely with double-sided non-slip rug tape. ?Do not have throw rugs and other things on the floor that can make you trip. ?What can I do in the bedroom? ?Use night lights. ?Make sure that you have a light by your bed that is easy to reach. ?Do not use any sheets or blankets that are too big for your bed. They should not hang down onto the floor. ?Have a firm chair that has side arms. You can use this for support while you get dressed. ?Do not have throw rugs and other things on the floor that can make you trip. ?What can I do in the kitchen? ?Clean up any spills right away. ?Avoid walking on wet floors. ?Keep items that you use a lot in easy-to-reach places. ?If you need to reach something above you, use a strong step stool that has a grab bar. ?Keep electrical cords out of the way. ?Do not use floor polish or wax that makes floors slippery. If you must use wax, use non-skid floor wax. ?Do not have throw rugs and other things on the floor that can make you trip. ?What can I do with my stairs? ?Do not leave any items on the stairs. ?Make sure that there are handrails on both sides of the stairs and use them. Fix handrails that  are broken or loose. Make sure that handrails are as long as the stairways. ?Check any carpeting to make sure that it is firmly attached to the stairs. Fix any carpet that is loose or worn. ?Avoid having throw rugs at the top or bottom of the stairs. If you do have throw rugs, attach them to the floor with carpet tape. ?Make sure that you have a light switch at the top of the stairs and the bottom of the stairs. If you do not have them, ask someone to add them for you. ?What else can I do to help prevent falls? ?Wear shoes that: ?Do not have high heels. ?Have rubber bottoms. ?Are comfortable and fit you well. ?Are closed at the toe. Do not wear sandals. ?If you use a stepladder: ?Make sure that it is fully opened.  Do not climb a closed stepladder. ?Make sure that both sides of the stepladder are locked into place. ?Ask someone to hold it for you, if possible. ?Clearly mark and make sure that you can see: ?Any grab

## 2021-07-26 ENCOUNTER — Other Ambulatory Visit: Payer: Self-pay | Admitting: Physician Assistant

## 2021-07-27 ENCOUNTER — Encounter: Payer: Self-pay | Admitting: Physician Assistant

## 2021-07-27 ENCOUNTER — Ambulatory Visit (INDEPENDENT_AMBULATORY_CARE_PROVIDER_SITE_OTHER): Payer: Medicare Other | Admitting: Physician Assistant

## 2021-07-27 VITALS — BP 150/70 | HR 62 | Temp 98.0°F | Ht 66.0 in | Wt 177.8 lb

## 2021-07-27 DIAGNOSIS — I10 Essential (primary) hypertension: Secondary | ICD-10-CM | POA: Diagnosis not present

## 2021-07-27 DIAGNOSIS — Z8639 Personal history of other endocrine, nutritional and metabolic disease: Secondary | ICD-10-CM | POA: Diagnosis not present

## 2021-07-27 DIAGNOSIS — F3289 Other specified depressive episodes: Secondary | ICD-10-CM

## 2021-07-27 DIAGNOSIS — M459 Ankylosing spondylitis of unspecified sites in spine: Secondary | ICD-10-CM | POA: Diagnosis not present

## 2021-07-27 LAB — POCT GLYCOSYLATED HEMOGLOBIN (HGB A1C): Hemoglobin A1C: 5.7 % — AB (ref 4.0–5.6)

## 2021-07-27 MED ORDER — ESCITALOPRAM OXALATE 20 MG PO TABS: 20.0000 mg | ORAL_TABLET | Freq: Every day | ORAL | 1 refills | Status: DC

## 2021-07-27 NOTE — Patient Instructions (Signed)
It was great to see you! ? ?-Increase lexapro to 20 mg daily ?-New referral to rheumatology ?-Referral to our pharmacist ?-Referral to aquatic therapy at the 90-95 degree pool at [facility] ?-Consider exercise program at the [facility] ? ?Let's follow-up in 3 months, sooner if you have concerns. ? ?If a referral was placed today, you will be contacted for an appointment. Please note that routine referrals can sometimes take up to 3-4 weeks to process. Please call our office if you haven't heard anything after this time frame. ? ?Take care, ? ?Inda Coke PA-C  ?

## 2021-07-27 NOTE — Progress Notes (Signed)
Vanessa Harmon is a 71 y.o. female here for follow-up of pre-existing problem. ? ?History of Present Illness:  ? ?Chief Complaint  ?Patient presents with  ? Hypertension  ?  Pt here for a 27mo F/u with Bp.  ? ? ?HPI ? ?HTN ?Vanessa Harmon currently compliant with taking irbesartan 300 mg daily, amlodipine 5 mg daily, and lasix 40 mg daily with no complications. At home blood pressure readings are: checked occasionally, about twice monthly due to frequent specialist visits. States that she would like to try and get off as much medication as she can. She is not sure why her BP is elevated today, but isn't concerned due to recent BP readings that were WNL. Patient denies chest pain, SOB, blurred vision, dizziness, unusual headaches, lower leg swelling. Denies excessive caffeine intake, stimulant usage, excessive alcohol intake, or increase in salt consumption. ? ?BP Readings from Last 3 Encounters:  ?07/27/21 (!) 150/70  ?06/30/21 134/83  ?05/10/21 132/76  ?  ? ?Insulin Resistance ?Pt has a hx of this but has never been on medication. Instead of medication she has been putting forth an effort to eat a well balanced diet and try to move as often as possible. She would like to re-check her HgbA1c today if possible.   ? ? ?Anxiety/Depression ?Vanessa Harmon currently compliant with taking lexapro 10 mg daily and trazodone 150 mg daily with no complications. States that she has found the medication helpful, but she has needed to add in melatonin 10 mg nightly as needed for sleep. Upon further discussion she does admit to feeling frustrated about her multiple health issues not being further evaluated in the way she expected by her specialists. Although she was a bit apprehensive in doing so, she is now interested in trialing an increase in her lexapro, especially with her trouble finding motivation to do things such as gardening or vacuuming due to her health.  ? ? ?Rheumatoid Arthritis; Lower Back Sciatica ?Pt is currently compliant  with taking Orencia 250 mg injection, gabapentin 300 mg nightly, robaxin 500 mg daily as needed, prednisone 5 mg daily. Although pt has found these medications and past trigger injections helpful, she is still experiencing pain. At this time she is regularly following up with [facility] rheumatology, but has expressed her desire to be referred to a different office. As explained above, she feels that a band aid is being put on her issue instead of a real solution trying to be found. Despite this she is also expressing how she is open to other options and forms of PT that could provide her with additional relief, especially since she doesn't want to have to take too many medications.  ? ?Past Medical History:  ?Diagnosis Date  ? Adenomatous polyp   ? Allergy   ? Anxiety   ? takes Ativan daily as needed  ? Arthritis   ? inflammatory arthritis (Dr. ADagoberto Ligas  ? Asthma   ? Cataract   ? Chronic back pain   ? scoliosis   ? Constipation   ? takes miralax every other day  ? Depression   ? takes Lexapro daily  ? Eczema   ? Essential hypertension, benign   ? takes Hyzaar and Metoprolol daily  ? Family history of adverse reaction to anesthesia   ? Sister PONV  ? Fibromyalgia   ? GERD (gastroesophageal reflux disease)   ? History of colon polyps   ? History of shingles   ? Hyperlipidemia   ?  takes Atorvastatin daily  ? Insomnia   ? takes Melatonin nightly  ? Joint pain   ? Osteoarthritis   ? Osteoporosis   ? PSVT (paroxysmal supraventricular tachycardia) ([place])   ? Recurrent upper respiratory infection (URI)   ? Rheumatoid arthritis ([place])   ? sees Dr. Amil Amen; stable on Orencia  ? Rhinitis, allergic   ? uses Flonase daily  ? Urinary urgency   ? Urticaria   ? ?  ?Social History  ? ?Tobacco Use  ? Smoking status: Former  ?  Types: Cigarettes  ?  Quit date: 1998  ?  Years since quitting: 25.2  ? Smokeless tobacco: Never  ? Tobacco comments:  ?  hasn't smoked in 70yr  ?Vaping Use  ? Vaping Use: Never used  ?Substance  Use Topics  ? Alcohol use: Yes  ?  Alcohol/week: 1.0 standard drink  ?  Types: 1 Glasses of wine per week  ?  Comment: rarely with dinner  ? Drug use: No  ? ? ?Past Surgical History:  ?Procedure Laterality Date  ? ADENOIDECTOMY    ? APPENDECTOMY    ? BACK SURGERY  2012  ? at age [age]d/t scoliosis  ? BLEPHAROPLASTY  2018  ? BREAST BIOPSY    ? cataract surgery    ? COLONOSCOPY    ? HARDWARE REMOVAL Left 02/24/2020  ? Procedure: HARDWARE REMOVAL LEFT DISTAL RADIUS;  Surgeon: KLeanora Cover MD;  [facility]  Service: Orthopedics;  Laterality: Left;  ? KNEE ARTHROSCOPY Bilateral   ? neuroma removed from foot    ? unsure of which foot  ? OPEN REDUCTION INTERNAL FIXATION (ORIF) DISTAL RADIAL FRACTURE Left 08/15/2019  ? Procedure: OPEN REDUCTION INTERNAL FIXATION (ORIF) DISTAL RADIAL FRACTURE;  Surgeon: KLeanora Cover MD;  [facility]  Service: Orthopedics;  Laterality: Left;  block in preop  ? POSTERIOR CERVICAL FUSION/FORAMINOTOMY N/A 05/29/2020  ? Procedure: Removal of hardware, Replacement of thoracic 8, thoracic 9, thoracic 10 screws, Right Thoracic 6-7, Right Thoracic 7-8 Laminectomies with thoracic hooks from Thoracic 4-5 to Thoracic 10;  Surgeon: SErline Levine MD;  [facility]  Service: Neurosurgery;  Laterality: N/A;  ? POSTERIOR LUMBAR FUSION 4 LEVEL N/A 02/15/2018  ? Procedure: Decompression and fusion Thoracic nine to Lumbar two with exploration of previous fusion;  Surgeon: SErline Levine MD;  [facility]  Service: Neurosurgery;  Laterality: N/A;  ? SHOULDER SURGERY Right   ? TONSILLECTOMY    ? TOTAL HIP ARTHROPLASTY Right 04/15/2014  ? Procedure: RIGHT TOTAL HIP ARTHROPLASTY ANTERIOR APPROACH;  Surgeon: TRenette Butters MD;  [facility]  Service: Orthopedics;  Laterality: Right;  ? TOTAL KNEE ARTHROPLASTY Left 11/26/2019  ? Procedure: TOTAL KNEE ARTHROPLASTY;  Surgeon: OParalee Cancel MD;  [facility];  Service: Orthopedics;  Laterality: Left;  69  mins  ? ? ?Family History  ?Problem Relation Age of Onset  ? Stroke Mother   ? Allergic rhinitis Mother   ? Allergic rhinitis Father   ? AAA (abdominal aortic aneurysm) Father   ? COPD Father   ? Prostate cancer Father   ? Hypertension Other   ?     unspecified grandmother  ? AAA (abdominal aortic aneurysm) Other   ?     unspecified grandfather  ? Cancer Other   ?     unspecified grandfather  ? Neuropathy Neg Hx   ? ? ?Allergies  ?Allergen Reactions  ? Advair Hfa [Fluticasone-Salmeterol] Other (See Comments)  ?  Hoarness  ? Statins Other (See Comments)  ? Breo Ellipta [Fluticasone Furoate-Vilanterol] Other (See Comments)  ?  Severe hoarseness   ? Fosamax [Alendronate Sodium] Other (See Comments)  ?  myalgias  ? Levaquin [Levofloxacin] Other (See Comments)  ?  Lightheadedness, not feeling well, ear pain  ? Doxycycline Hyclate   ? Infliximab Other (See Comments)  ?  Skin reaction  ? Spiriva Handihaler [Tiotropium Bromide Monohydrate]   ?  UNSPECIFIED REACTION   ? Verapamil   ? Tape Rash  ?  PAPER TAPE: Causes severe rash and weeping  ? ? ?Current Medications:  ? ?Current Outpatient Medications:  ?  abatacept (ORENCIA) 250 MG injection, Inject 250 mg into the vein every 30 (thirty) days., Disp: , Rfl:  ?  albuterol (PROAIR HFA) 108 (90 Base) MCG/ACT inhaler, Inhale two puffs every 4-6 hours if needed for cough or wheeze. (Patient taking differently: Inhale 2 puffs into the lungs every 4 (four) hours as needed for wheezing or shortness of breath. Inhale two puffs every 4-6 hours if needed for cough or wheeze.), Disp: 8.5 Inhaler, Rfl: 1 ?  amLODipine (NORVASC) 5 MG tablet, , Disp: , Rfl:  ?  aspirin EC 81 MG tablet, Take 1 tablet (81 mg total) by mouth daily. Swallow whole., Disp: 30 tablet, Rfl: 11 ?  azelastine (ASTELIN) 0.1 % nasal spray, Place 1 spray into both nostrils 2 (two) times daily. Use in each nostril as directed, Disp: , Rfl:  ?  Bempedoic Acid (NEXLETOL) 180 MG TABS, Take 1 Dose by mouth daily., Disp: 30  tablet, Rfl: 11 ?  budesonide (PULMICORT) 0.5 MG/2ML nebulizer solution, Inhale into the lungs., Disp: , Rfl:  ?  Calcium Carb-Cholecalciferol (CALCIUM 600/VITAMIN D3 PO), Take 1 tablet by mouth daily

## 2021-08-04 ENCOUNTER — Telehealth: Payer: Self-pay | Admitting: Physician Assistant

## 2021-08-04 NOTE — Progress Notes (Signed)
? ?Chronic Care Management ?Pharmacy Note ? ?08/11/2021 ?Name:  Vanessa Harmon MRN:  [id number] DOB:  [birth year] ? ?Summary: ?Initial visit with PharmD.  Patient wants to reduce her pill burden because she feels like she takes a lot.  I feel she could eventually get rid of Zetia if her LDL becomes well controlled on other therapies.  She has also not been checking BP with recent elevations in office.  Room to go up on amlodipine if needed. ? ?Recommendations/Changes made from today's visit: ?Could also consider d/c famotidine if she is not having acid reflux.  Would be hesitant to stome Prilosec due to long term prednisone. ?Could also combine BP meds once she is stable and BP is controlled.. ? ?Plan: ?FU 3 months ? ? ?Subjective: ?Vanessa Harmon is an 71 y.o. year old female who is a primary patient of Inda Coke, [place].  The CCM team was consulted for assistance with disease management and care coordination needs.   ? ?Engaged with patient face to face for initial visit in response to provider referral for pharmacy case management and/or care coordination services.  ? ?Consent to Services:  ?The patient was given the following information about Chronic Care Management services today, agreed to services, and gave verbal consent: 1. CCM service includes personalized support from designated clinical staff supervised by the primary care provider, including individualized plan of care and coordination with other care providers 2. 24/7 contact phone numbers for assistance for urgent and routine care needs. 3. Service will only be billed when office clinical staff spend 20 minutes or more in a month to coordinate care. 4. Only one practitioner may furnish and bill the service in a calendar month. 5.The patient may stop CCM services at any time (effective at the end of the month) by phone call to the office staff. 6. The patient will be responsible for cost sharing (co-pay) of up to 20% of the service fee (after  annual deductible is met). Patient agreed to services and consent obtained. ? ?Patient Care Team: ?Inda Coke, [place] as PCP - General (Physician Assistant) ?Hennie Duos, MD as Consulting Physician (Rheumatology) ?Erline Levine, MD as Consulting Physician (Neurosurgery) ?Richardean Sale, MD as Referring Physician (Allergy and Immunology) ?Clent Jacks, MD as Consulting Physician (Ophthalmology) ?Rankin, Clent Demark, MD as Consulting Physician ([facility]) ?Paralee Cancel, MD as Consulting Physician (Orthopedic Surgery) ?Rosita Fire, MD as Consulting Physician (Nephrology) ?Edythe Clarity, [facility] as Pharmacist (Pharmacist) ? ?Recent office visits:  ?07/27/2021 OV (PCP) Inda Coke, [place]; Increase lexapro to 20 mg daily  ?  ?Recent consult visits:  ?06/30/2021 OV (Vascular Surgeon) Angelia Mould, MD; no medication changes indicated. ?  ?05/10/2021 OV (Cardiology) Pixie Casino, MD;  We discussed the possibility of adding Nexletol to her therapy.  This could give [place] another 20 to 25% reduction in her lipids and ultimately be combined with her Zetia as Nexlizet, which could help with compliance.  She seemed interested in this.  We will provide a couple weeks of samples and if well-tolerated pursue Nexlizet therapy. ?  ?05/06/2021 OV (Allergy) Richardean Sale, MD; She will otherwise take azelastine two sprays per nostril twice daily and Zyrtec 10 mg once daily. She will continue on budesonide lavage 0.5 mg twice daily but discussed that she can increase this dosage to 1 mg twice daily when she anticipates increased symptoms or has increased symptoms. Discussed that per her rheumatologist recommendations, she could try to start a different medication to treat  active RA to help overall improve autoimmune/allergy symptoms ?  ?Hospital visits:  ?None in previous 6 months ? ? ?Objective: ? ?Lab Results  ?Component Value Date  ? CREATININE 1.0 04/05/2021  ? BUN 13 04/05/2021  ? GFR 65.28  08/26/2020  ? GFRNONAA 63 04/05/2021  ? GFRAA >60 11/27/2019  ? NA 139 04/05/2021  ? K 4.8 04/05/2021  ? CALCIUM 9.4 04/05/2021  ? CO2 24 (A) 04/05/2021  ? GLUCOSE 145 (H) 08/26/2020  ? ? ?Lab Results  ?Component Value Date/Time  ? HGBA1C 5.7 (A) 07/27/2021 02:26 PM  ? HGBA1C 5.8 07/29/2020 11:00 AM  ? HGBA1C 5.9 (H) 04/13/2017 12:01 PM  ? GFR 65.28 08/26/2020 02:04 PM  ? GFR 66.20 08/03/2020 01:39 PM  ?  ?Last diabetic Eye exam: No results found for: HMDIABEYEEXA  ?Last diabetic Foot exam: No results found for: HMDIABFOOTEX  ? ?Lab Results  ?Component Value Date  ? CHOL 305 (H) 07/29/2020  ? HDL 52.50 07/29/2020  ? LDLCALC 223 (H) 07/29/2020  ? TRIG 149.0 07/29/2020  ? CHOLHDL 6 07/29/2020  ? ? ? ?  Latest Ref Rng & Units 04/05/2021  ? 12:00 AM 08/19/2020  ? 12:00 AM 07/29/2020  ? 11:00 AM  ?Hepatic Function  ?Total Protein 6.0 - 8.3 g/dL   6.8    ?Albumin 3.5 - 5.0 4.5      4.4      4.2    ?AST 0 - 37 U/L   13    ?ALT 0 - 35 U/L   14    ?Alk Phosphatase 39 - 117 U/L   70    ?Total Bilirubin 0.2 - 1.2 mg/dL   0.5    ?  ? This result is from an external source.  ? ? ?Lab Results  ?Component Value Date/Time  ? TSH 0.77 09/04/2020 02:46 PM  ? TSH 0.75 08/19/2020 12:00 AM  ? TSH 3.36 04/13/2017 12:01 PM  ? FREET4 0.85 09/04/2020 02:46 PM  ? FREET4 1.03 06/03/2013 10:12 AM  ? ? ? ?  Latest Ref Rng & Units 07/29/2020  ? 11:00 AM 05/27/2020  ?  1:33 PM 11/27/2019  ?  2:44 AM  ?CBC  ?WBC 4.0 - 10.5 K/uL 8.9   12.7   18.2    ?Hemoglobin 12.0 - 15.0 g/dL 12.3   13.4   9.5    ?Hematocrit 36.0 - 46.0 % 36.2   40.6   28.9    ?Platelets 150.0 - 400.0 K/uL 363.0   350   232    ? ? ?Lab Results  ?Component Value Date/Time  ? VD25OH 46 04/13/2017 12:01 PM  ? VD25OH 31 02/01/2016 10:11 AM  ? ? ?Clinical ASCVD: Yes  ?The 10-year ASCVD risk score (Arnett DK, et al., 2019) is: 36.6% ?  Values used to calculate the score: ?    Age: 71 years ?    Sex: Female ?    Is Non-Hispanic African American: No ?    Diabetic: Yes ?    Tobacco smoker: No ?     Systolic Blood Pressure: 150 mmHg ?    Is BP treated: Yes ?    HDL Cholesterol: 52.5 mg/dL ?    Total Cholesterol: 208 mg/dL   ? ? ?  07/08/2021  ?  1:32 PM 01/22/2021  ?  2:26 PM 07/22/2020  ?  2:49 PM  ?Depression screen PHQ 2/9  ?Decreased Interest 0 1 1  ?Down, Depressed, Hopeless 0 1 2  ?PHQ -   2 Score 0 2 3  ?Altered sleeping  1 3  ?Tired, decreased energy  2 2  ?Change in appetite  1 0  ?Feeling bad or failure about yourself   1 0  ?Trouble concentrating  0 0  ?Moving slowly or fidgety/restless  0 0  ?Suicidal thoughts  0 0  ?PHQ-9 Score  7 8  ?Difficult doing work/chores  Somewhat difficult Somewhat difficult  ?  ? ? ? ?Social History  ? ?Tobacco Use  ?Smoking Status Former  ? Types: Cigarettes  ? Quit date: 1998  ? Years since quitting: 25.3  ?Smokeless Tobacco Never  ?Tobacco Comments  ? hasn't smoked in 20yrs  ? ?BP Readings from Last 3 Encounters:  ?07/27/21 (!) 150/70  ?06/30/21 134/83  ?05/10/21 132/76  ? ?Pulse Readings from Last 3 Encounters:  ?07/27/21 62  ?06/30/21 (!) 58  ?05/10/21 88  ? ?Wt Readings from Last 3 Encounters:  ?07/27/21 177 lb 12.8 oz (80.6 kg)  ?06/30/21 175 lb (79.4 kg)  ?05/10/21 177 lb 6.4 oz (80.5 kg)  ? ?BMI Readings from Last 3 Encounters:  ?07/27/21 28.70 kg/m?  ?06/30/21 28.25 kg/m?  ?05/10/21 28.63 kg/m?  ? ? ?Assessment/Interventions: Review of patient past medical history, allergies, medications, health status, including review of consultants reports, laboratory and other test data, was performed as part of comprehensive evaluation and provision of chronic care management services.  ? ?SDOH:  (Social Determinants of Health) assessments and interventions performed: Yes ? ?Financial Resource Strain: Low Risk   ? Difficulty of Paying Living Expenses: Not hard at all  ? ?Food Insecurity: No Food Insecurity  ? Worried About Running Out of Food in the Last Year: Never true  ? Ran Out of Food in the Last Year: Never true  ? ? ?SDOH Screenings  ? ?Alcohol Screen: Not on file   ?Depression (PHQ2-9): Low Risk   ? PHQ-2 Score: 0  ?Financial Resource Strain: Low Risk   ? Difficulty of Paying Living Expenses: Not hard at all  ?Food Insecurity: No Food Insecurity  ? Worried About Running Out

## 2021-08-04 NOTE — Chronic Care Management (AMB) (Signed)
?  Chronic Care Management  ? ?Note ? ?08/04/2021 ?Name: [name] MRN: [id number] DOB: [birth year] ? ?Vanessa Harmon is a 71 y.o. year old female who is a primary care patient of Inda Coke, [place]. I reached out to [name] by phone today in response to a referral sent by Ms. Rozanna Boer Weight's PCP, Inda Coke, [place].  ? ?Ms. Muzquiz was given information about Chronic Care Management services today including:  ?CCM service includes personalized support from designated clinical staff supervised by her physician, including individualized plan of care and coordination with other care providers ?24/7 contact phone numbers for assistance for urgent and routine care needs. ?Service will only be billed when office clinical staff spend 20 minutes or more in a month to coordinate care. ?Only one practitioner may furnish and bill the service in a calendar month. ?The patient may stop CCM services at any time (effective at the end of the month) by phone call to the office staff. ? ? ?Patient agreed to services and verbal consent obtained.  ? ?Follow up plan:REFERRAL/ NO COPAY ? ? ?Tatjana Dellinger ?Upstream Scheduler  ?

## 2021-08-05 ENCOUNTER — Telehealth: Payer: Self-pay | Admitting: Pharmacist

## 2021-08-05 NOTE — Progress Notes (Signed)
? ? ?Chronic Care Management ?Pharmacy Assistant  ? ?Name: Vanessa Harmon  MRN: [id number] DOB: [birth year] ? ? ?Reason for Encounter: Chart Review For Initial Visit With Clinical Pharmacist ?  ?Conditions to be addressed/monitored: ?HTN, Asthma, GERD, HLD ? ?Primary concerns for visit include: ?HTN  ? ?Recent office visits:  ?07/27/2021 OV (PCP) Inda Coke, [place]; Increase lexapro to 20 mg daily  ? ?Recent consult visits:  ?06/30/2021 OV (Vascular Surgeon) Angelia Mould, MD; no medication changes indicated. ? ?05/10/2021 OV (Cardiology) Pixie Casino, MD;  We discussed the possibility of adding Nexletol to her therapy.  This could give [place] another 20 to 25% reduction in her lipids and ultimately be combined with her Zetia as Nexlizet, which could help with compliance.  She seemed interested in this.  We will provide a couple weeks of samples and if well-tolerated pursue Nexlizet therapy. ? ?05/06/2021 OV (Allergy) Richardean Sale, MD; She will otherwise take azelastine two sprays per nostril twice daily and Zyrtec 10 mg once daily. She will continue on budesonide lavage 0.5 mg twice daily but discussed that she can increase this dosage to 1 mg twice daily when she anticipates increased symptoms or has increased symptoms. Discussed that per her rheumatologist recommendations, she could try to start a different medication to treat active RA to help overall improve autoimmune/allergy symptoms ? ?Hospital visits:  ?None in previous 6 months ? ?Medications: ?Outpatient Encounter Medications as of 08/05/2021  ?Medication Sig  ? abatacept (ORENCIA) 250 MG injection Inject 250 mg into the vein every 30 (thirty) days.  ? albuterol (PROAIR HFA) 108 (90 Base) MCG/ACT inhaler Inhale two puffs every 4-6 hours if needed for cough or wheeze. (Patient taking differently: Inhale 2 puffs into the lungs every 4 (four) hours as needed for wheezing or shortness of breath. Inhale two puffs every 4-6 hours if needed for cough  or wheeze.)  ? amLODipine (NORVASC) 5 MG tablet   ? aspirin EC 81 MG tablet Take 1 tablet (81 mg total) by mouth daily. Swallow whole.  ? azelastine (ASTELIN) 0.1 % nasal spray Place 1 spray into both nostrils 2 (two) times daily. Use in each nostril as directed  ? Bempedoic Acid (NEXLETOL) 180 MG TABS Take 1 Dose by mouth daily.  ? budesonide (PULMICORT) 0.5 MG/2ML nebulizer solution Inhale into the lungs.  ? Calcium Carb-Cholecalciferol (CALCIUM 600/VITAMIN D3 PO) Take 1 tablet by mouth daily.  ? carboxymethylcellulose (REFRESH PLUS) 0.5 % SOLN Place 1 drop into both eyes 3 (three) times daily as needed (dry eyes).   ? Cetirizine HCl (ZYRTEC ALLERGY) 10 MG CAPS Zyrtec 10 mg capsule  ? Coenzyme Q10 (CO Q-10) 100 MG CAPS   ? denosumab (PROLIA) 60 MG/ML SOSY injection Inject 60 mg into the skin every 6 (six) months.  ? diphenhydrAMINE (BENADRYL) 25 MG tablet Take 50 mg by mouth daily as needed for allergies.  ? escitalopram (LEXAPRO) 20 MG tablet Take 1 tablet (20 mg total) by mouth daily.  ? Evolocumab (REPATHA SURECLICK) 027 MG/ML SOAJ Inject 1 Dose into the skin every 14 (fourteen) days.  ? ezetimibe (ZETIA) 10 MG tablet TAKE ONE TABLET BY MOUTH DAILY  ? famotidine (PEPCID) 40 MG tablet TAKE 1 TABLET (40 MG TOTAL) BY MOUTH AT BEDTIME.  ? fluticasone (FLOVENT HFA) 220 MCG/ACT inhaler Inhale 2 puffs into the lungs 2 (two) times daily.  ? furosemide (LASIX) 40 MG tablet Take by mouth.  ? gabapentin (NEURONTIN) 300 MG capsule Take 300 mg by mouth at  bedtime.  ? guaiFENesin (MUCINEX) 600 MG 12 hr tablet Take 600 mg by mouth 2 (two) times daily as needed for cough or to loosen phlegm.  ? HYDROcodone-acetaminophen (NORCO) 10-325 MG tablet 1 tablet as needed  ? indomethacin (INDOCIN SR) 75 MG CR capsule TAKE ONE CAPSULE BY MOUTH DAILY WITH BREAKFAST  ? ipratropium (ATROVENT) 0.03 % nasal spray Place 1 spray into both nostrils 2 (two) times daily as needed for rhinitis.  ? irbesartan (AVAPRO) 300 MG tablet Take 1 tablet  (300 mg total) by mouth daily.  ? leucovorin (WELLCOVORIN) 5 MG tablet 1 tablet  ? Lifitegrast (XIIDRA) 5 % SOLN Place 1 drop into both eyes in the morning and at bedtime.   ? Magnesium 400 MG CAPS   ? methocarbamol (ROBAXIN) 500 MG tablet Take 1 tablet (500 mg total) by mouth every 6 (six) hours as needed for muscle spasms.  ? Methotrexate 15 MG/0.6ML SOSY   ? montelukast (SINGULAIR) 10 MG tablet Take 1 tablet (10 mg total) by mouth at bedtime.  ? Multiple Vitamin (MULTIVITAMIN) capsule Take 1 capsule by mouth daily.  ? Needles & Syringes (1CC TB SYRINGE) MISC 27g 1/2"  ? Omega-3 Fatty Acids (FISH OIL PO) Take 1,000 mg by mouth daily.  ? omeprazole (PRILOSEC) 40 MG capsule TAKE ONE CAPSULE BY MOUTH DAILY BEFORE BREAKFAST DAILY (Patient taking differently: Take 40 mg by mouth daily. TAKE ONE CAPSULE BY MOUTH DAILY BEFORE BREAKFAST DAILY)  ? OVER THE COUNTER MEDICATION Place 1 application into both eyes at bedtime. [place] overnight eye ointment  ? polyethylene glycol powder (GLYCOLAX/MIRALAX) 17 GM/SCOOP powder Miralax 17 gram/dose oral powder  ? predniSONE (DELTASONE) 5 MG tablet Take 5 mg by mouth daily with breakfast.   ? PRESCRIPTION MEDICATION Place 1 spray into the nose in the morning and at bedtime. Budesonide 0.5 mg / 2 mL Nasal rinse  ? pseudoephedrine (SUDAFED) 30 MG tablet Take 30 mg by mouth every 4 (four) hours as needed for congestion.  ? traZODone (DESYREL) 150 MG tablet TAKE ONE TABLET BY MOUTH DAILY  ? valACYclovir (VALTREX) 1000 MG tablet Take 1,000 mg by mouth daily.   ? vitamin C (ASCORBIC ACID) 500 MG tablet Take 500 mg by mouth daily.  ? ?No facility-administered encounter medications on file as of 08/05/2021.  ? ?Current Medications: ?Escitalopram 20 mg last filled 07/27/2021 90 DS ?Ezetimibe 10 mg last filled 07/01/2021 90 DS ?Trazodone 150 mg last filled 06/21/2021 90 DS ?Nexletol 180 mg last filled 07/15/2021 30 DS ?Coenzyme Q10 100 mg takes daily ?Leucovorin 5 mg last filled 06/10/2021 90  DS ?Magnesium 400 mg takes daily ?Methotrexate 15 mg/0.15m last filled 05/24/2021 112 DS ?Needles & Syringes 1cc ?Hydrocodone-acetaminophen 10-325 mg ?Amlodipine 5 mg last filled 06/09/2021 90 DS ?Indomethacin 75 mg ?Irbesartan 300 mg last filled 07/01/2021 90 DS ?Evolocumab 140 mg last filled 07/30/2021 28 DS ?Budesonide 0.5 mg/243mlast filled 07/01/2021 30 DS ?Cetirizine 10 mg takes daily ?Furosemide 40 mg last filled 06/21/2021 90 DS ?Miralax 17 gm takes daily ?Aspirin 81 mgtakes daily ?Methocarbamol 500 mg last filled 07/20/2021 30 DS ?Xildra 5% solution last filled 08/02/2021 90 DS ?Valacyclovir HCL 1 gm last filled 05/24/2021 90 DS ?Trazodone 150 mg last filled 06/21/2021 90 DS ?Azelastine 0.1% nasal spray ?Ipratropium 0.03% nasal spray ?Mucinex 600 mg prn ?Gabapenin 300 mg last filled 07/01/2021 90 DS ?Calcium 600/Vitamin D3 PO daily ?Fluticasone 220 mcg/act ?Benadryl 25 mg prn ?Prolia 60 mg/mL ? ? ?Patient Questions: ?Any changes in your medications or  health? ?Patient states Lexapro increased to 20 mg recently. ? ?Any side effects from any medications?  ?Patient states her eye drops Dala Dock causes temporary blurriness. ? ?Do you have any symptoms or problems not managed by your medications? ?Patient states her allergy symptoms are not well controlled despite what she is currently taking. ? ?Any concerns about your health right now? ?Patient states her back pain is not well controlled even though she's in pain management. ? ?Has your provider asked that you check blood pressure, blood sugar, or follow special diet at home? ?Patient does not check her blood pressure or blood sugar. She tries to low fat diet and follow portion control. ? ?Do you get any type of exercise on a regular basis? ?Patient states she does some outpatient physical therapy. ? ?Can you think of a goal you would like to reach for your health? ?"I would like to be pain free and improve my blood pressure." ? ?Do you have any problems getting  your medications? ?Patient states most of her medications are expensive. ? ?Is there anything that you would like to discuss during the appointment?  ?Patient states she would like to discuss her medica

## 2021-08-10 ENCOUNTER — Ambulatory Visit (INDEPENDENT_AMBULATORY_CARE_PROVIDER_SITE_OTHER): Payer: Medicare Other | Admitting: Pharmacist

## 2021-08-10 DIAGNOSIS — E785 Hyperlipidemia, unspecified: Secondary | ICD-10-CM

## 2021-08-10 DIAGNOSIS — I10 Essential (primary) hypertension: Secondary | ICD-10-CM

## 2021-08-10 DIAGNOSIS — F3289 Other specified depressive episodes: Secondary | ICD-10-CM

## 2021-08-10 DIAGNOSIS — M459 Ankylosing spondylitis of unspecified sites in spine: Secondary | ICD-10-CM

## 2021-08-10 DIAGNOSIS — E118 Type 2 diabetes mellitus with unspecified complications: Secondary | ICD-10-CM

## 2021-08-11 NOTE — Patient Instructions (Addendum)
Visit Information ? ? Goals Addressed   ? ?  ?  ?  ?  ? This Visit's Progress  ?  Track and Manage My Blood Pressure-Hypertension     ?  Timeframe:  Long-Range Goal ?Priority:  High ?Start Date:  08/11/21                           ?Expected End Date:  02/10/22                    ? ?Follow Up Date 11/10/21  ?  ?- check blood pressure weekly ?- choose a place to take my blood pressure (home, clinic or office, retail store) ?- write blood pressure results in a log or diary  ?  ?Why is this important?   ?You won't feel high blood pressure, but it can still hurt your blood vessels.  ?High blood pressure can cause heart or kidney problems. It can also cause a stroke.  ?Making lifestyle changes like losing a little weight or eating less salt will help.  ?Checking your blood pressure at home and at different times of the day can help to control blood pressure.  ?If the doctor prescribes medicine remember to take it the way the doctor ordered.  ?Call the office if you cannot afford the medicine or if there are questions about it.   ?  ?Notes:  ?  ? ?  ? ?Patient Care Plan: General Pharmacy (Adult)  ?  ? ?Problem Identified: HTN, Asthma, RA, HLD, Depression   ?Priority: High  ?Onset Date: 08/11/2021  ?  ? ?Long-Range Goal: Patient-Specific Goal   ?Start Date: 08/11/2021  ?Expected End Date: 02/10/2022  ?This Visit's Progress: On track  ?Priority: High  ?Note:   ?Current Barriers:  ?Unable to achieve control of BP  ? ?Pharmacist Clinical Goal(s):  ?Patient will achieve improvement in BP as evidenced by monitoring through collaboration with PharmD and provider.  ? ?Interventions: ?1:1 collaboration with Inda Coke, PA regarding development and update of comprehensive plan of care as evidenced by provider attestation and co-signature ?Inter-disciplinary care team collaboration (see longitudinal plan of care) ?Comprehensive medication review performed; medication list updated in electronic medical record ? ?Hypertension (BP  goal <130/80) ?-Uncontrolled ?-Current treatment: ?Amlodipine '5mg'$  daily Appropriate, Effective, Safe, Accessible ?Irbesartan '300mg'$  daily Appropriate, Effective, Safe, Accessible ?Furosemide '40mg'$  Appropriate, Effective, Safe, Accessible ?-Medications previously tried: losartan/HCTZ, verapamil, olmesartan  ?-Current home readings: not checking at home currently ?-Current exercise habits: minimal due to pack pain and inability to stand for longer periods of time ?-Denies hypotensive/hypertensive symptoms ?-Educated on BP goals and benefits of medications for prevention of heart attack, stroke and kidney damage; ?Daily salt intake goal < 2300 mg; ?Exercise goal of 150 minutes per week; ?Importance of home blood pressure monitoring; ?Symptoms of hypotension and importance of maintaining adequate hydration; ?-Counseled to monitor BP at home a few times per week, document, and provide log at future appointments ?-Counseled on diet and exercise extensively ?Recommended to continue current medication ?Will have CMA check in 30 days from now to assess home BP monitoring.  If needed, we could increase amlodipine to '10mg'$  daily. ? ?Hyperlipidemia: (LDL goal < 70) ?-Uncontrolled ?-Current treatment: ?Nexletol '180mg'$  Appropriate, Effective, Safe, Accessible ?Repatha '140mg'$  every 14 days Appropriate, Effective, Safe, Accessible ?Zetia '10mg'$  Appropriate, Effective, Safe, Accessible ?-Medications previously tried: statins  ?-Current dietary patterns: she likes to eat healthy, "it depends on who is cooking" ?-Current exercise habits:  minimal ?-Educated on Cholesterol goals;  ?Importance of limiting foods high in cholesterol; ?Exercise goal of 150 minutes per week; ?-Recommend recheck lipids.  Most recent lipid panel improved since she was on current regimen.  No changes at this time just continue what she is on previously.  She wants to decrease pill burden.  Over time she may be able to taper off Zetia but would want LDL to be at goal  first. ? ?Asthma (Goal: control symptoms and prevent exacerbations) ?-Controlled ?-Current treatment  ?Flovent HFA 220 mg BID Appropriate, Effective, Safe, Accessible ?Albuterol HFA 90 mcg prn Appropriate, Effective, Safe, Accessible ?Montelukast '10mg'$  Appropriate, Effective, Safe, Accessible ?-Medications previously tried: none noted  ?-Pulmonary function testing: Pulmonary Functions Testing Results: ? ?No results found for: FEV1, FVC, FEV1FVC, TLC, DLCO ?-Exacerbations requiring treatment in last 6 months: none ?-Patient denies consistent use of maintenance inhaler ?-Frequency of rescue inhaler use: once or twice per week ?-Counseled on Proper inhaler technique; ?Benefits of consistent maintenance inhaler use ?When to use rescue inhaler ?Differences between maintenance and rescue inhalers ?-Recommended to continue current medication ? ?Depression (Goal: Minimize symptoms) ?-Controlled ?-Current treatment: ?Escitalopram '20mg'$  daily Appropriate, Query effective, ,  ?-Medications previously tried/failed: none noted ?-PHQ9:  ? ?  07/08/2021  ?  1:32 PM 01/22/2021  ?  2:26 PM 07/22/2020  ?  2:49 PM  ?PHQ9 SCORE ONLY  ?PHQ-9 Total Score 0 7 8  ?-Mood has improved since increasing the dose to '20mg'$ .  Counseled on giving the full 3-4 weeks to see full benefit. ?-Educated on Benefits of medication for symptom control ?-Recommended to continue current medication ? ?RA/OA (Goal: Reduce symptoms) ?-Controlled ?-Current treatment  ?Orencia '250mg'$  every 30 days Appropriate, Query effective, ,  ?Leucovorin '5mg'$  daily Appropriate, Effective, Safe, Accessible ?Methotrexate '15mg'$  Injection Appropriate, Effective, Safe, Accessible ?-Medications previously tried: none noted ?-Not seeing much benefit from  [place].  Plans to discuss d/c with MD at upcoming visit. ?-Recommended to continue current medication ?Discuss d/c ineffective therapies at upcoming visits. ? ?Patient Goals/Self-Care Activities ?Patient will:  ?- take medications as  prescribed as evidenced by patient report and record review ?check blood pressure a few times per week, document, and provide at future appointments ?target a minimum of 150 minutes of moderate intensity exercise weekly ? ?Follow Up Plan: The care management team will reach out to the patient again over the next 90  days.  ? ?  ? ? ?Ms. Lymon was given information about Chronic Care Management services today including:  ?CCM service includes personalized support from designated clinical staff supervised by her physician, including individualized plan of care and coordination with other care providers ?24/7 contact phone numbers for assistance for urgent and routine care needs. ?Standard insurance, coinsurance, copays and deductibles apply for chronic care management only during months in which we provide at least 20 minutes of these services. Most insurances cover these services at 100%, however patients may be responsible for any copay, coinsurance and/or deductible if applicable. This service may help you avoid the need for more expensive face-to-face services. ?Only one practitioner may furnish and bill the service in a calendar month. ?The patient may stop CCM services at any time (effective at the end of the month) by phone call to the office staff. ? ?Patient agreed to services and verbal consent obtained.  ? ?The patient verbalized understanding of instructions, educational materials, and care plan provided today and agreed to receive a mailed copy of patient instructions, educational materials, and care plan.  ?  Telephone follow up appointment with pharmacy team member scheduled for: 3 months ? ?Edythe Clarity, [facility]  ?Beverly Milch, PharmD ?Clinical Pharmacist  ?[facility] ?([phone] ? ?

## 2021-08-12 IMAGING — CT CT L SPINE W/O CM
3 of 5 series · 12 of 33 positions shown, 14 images · non-contrast
Comparison: Total CT myelogram 04/02/2019.

Thoracic spine CT today reported separately.

CLINICAL DATA: 69-year-old female with prior thoracic and
lumbosacral fusion. Back pain. Query hardware loosening.

EXAM:
CT LUMBAR SPINE WITHOUT CONTRAST
TECHNIQUE: Multidetector CT imaging of the lumbar spine was performed without
intravenous contrast administration. Multiplanar CT image
reconstructions were also generated.

[Series 6: l-spine 2.00 br40 s3 lspine st · axial · 0.31mm/px · z∈[-1205,-1055]mm · 4 of 109 slices shown, 5 images]
[im 19/109  soft-tissue]
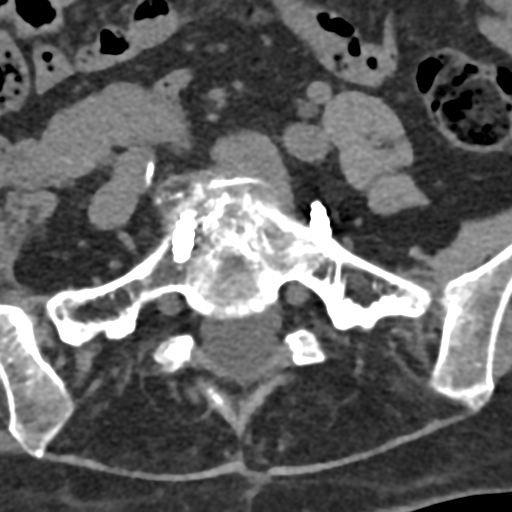
[im 19/109  bone]
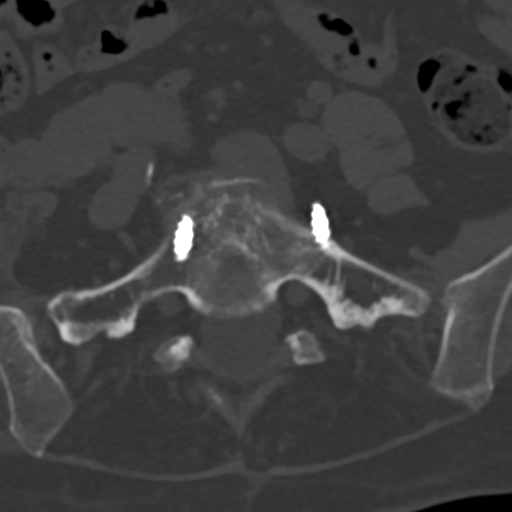
[im 37/109  bone]
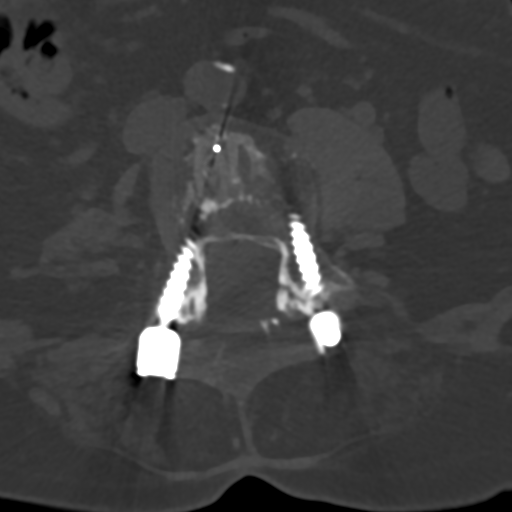
[im 73/109  bone]
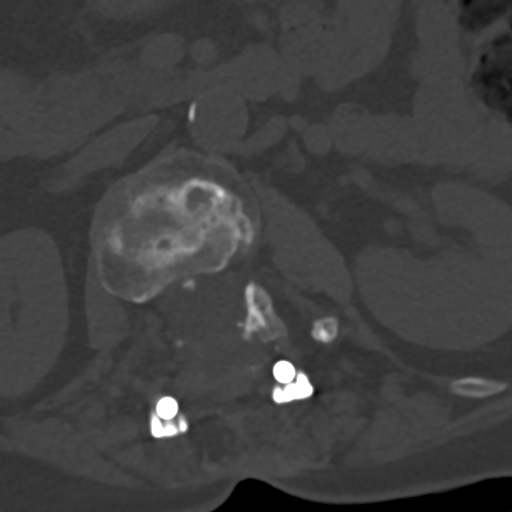
[im 91/109  bone]
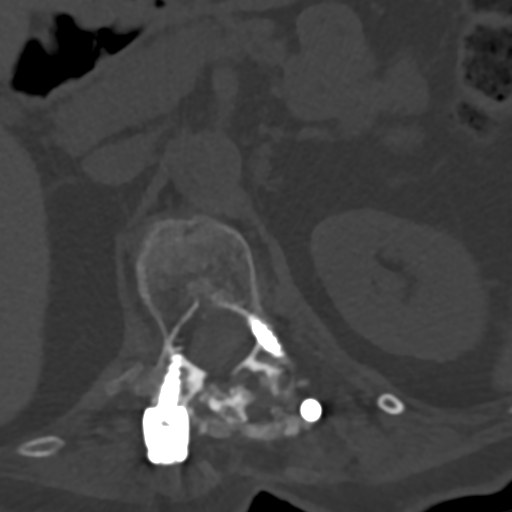

[Series 8: l-spine 2.00 br60 s3 sag bone · sagittal · 0.31mm/px · 5 of 79 slices shown, 6 images]
[im 27/79  bone]
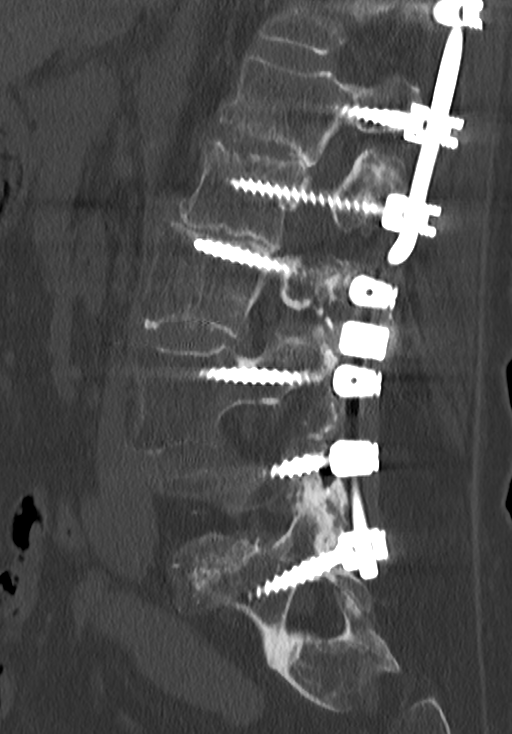
[im 33/79  bone]
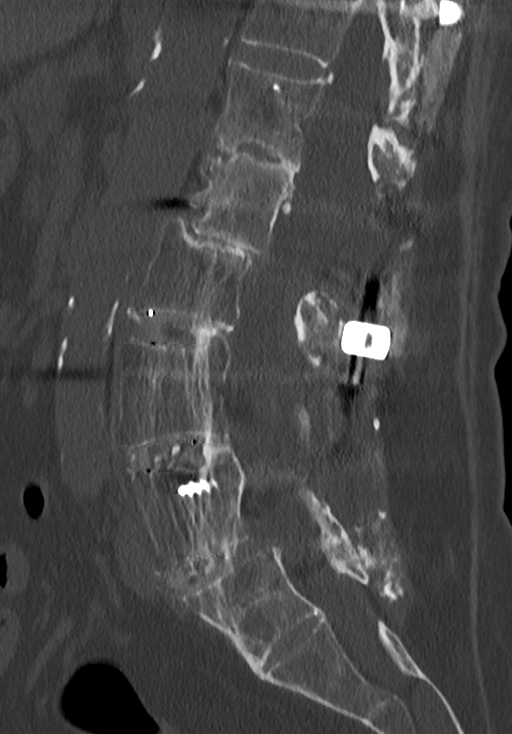
[im 40/79  soft-tissue]
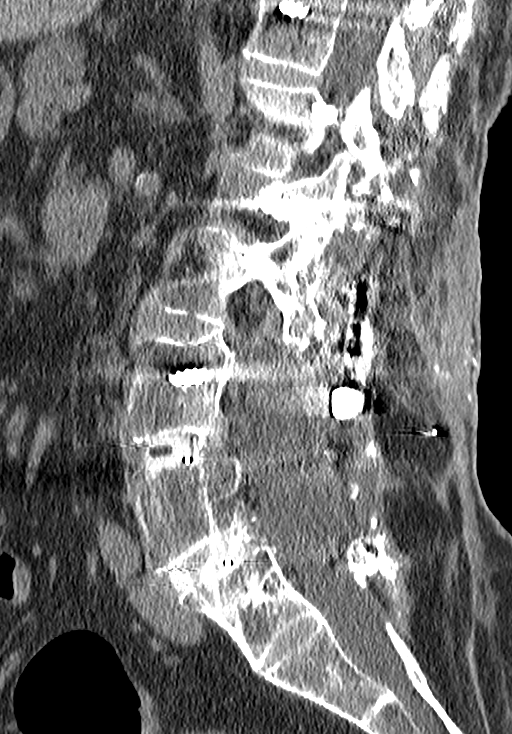
[im 40/79  bone]
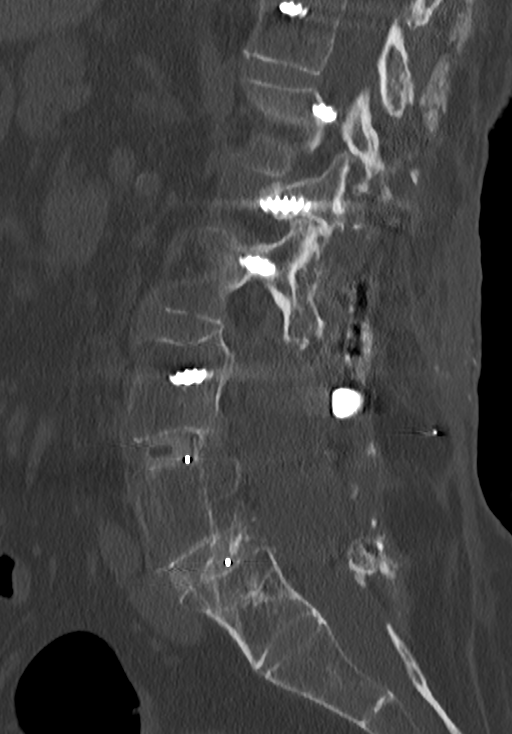
[im 46/79  bone]
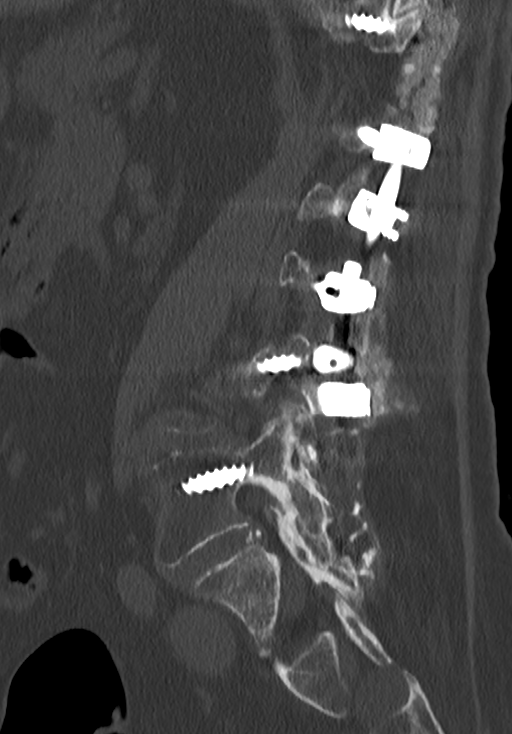
[im 53/79  bone]
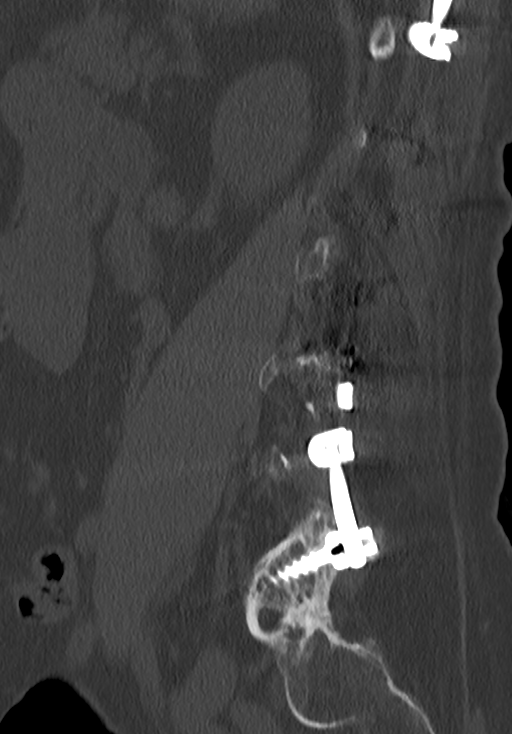

[Series 10: l-spine 2.00 br60 s3 cor bone · coronal · 0.31mm/px · 3 of 79 slices shown]
[im 20/79  bone]
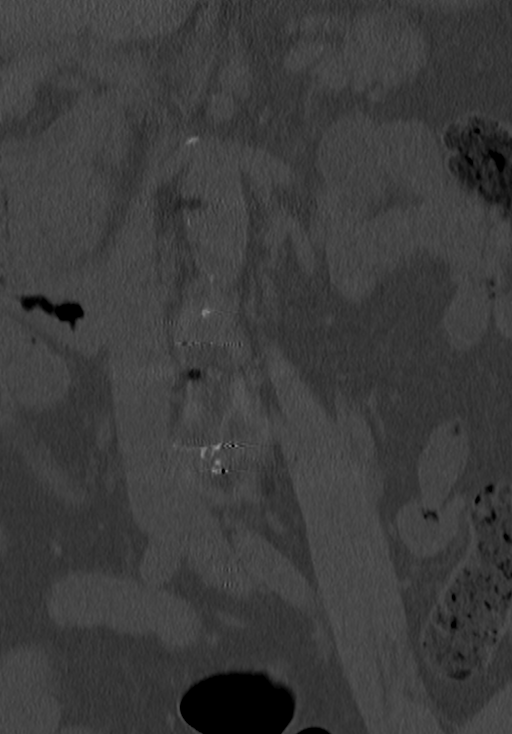
[im 40/79  bone]
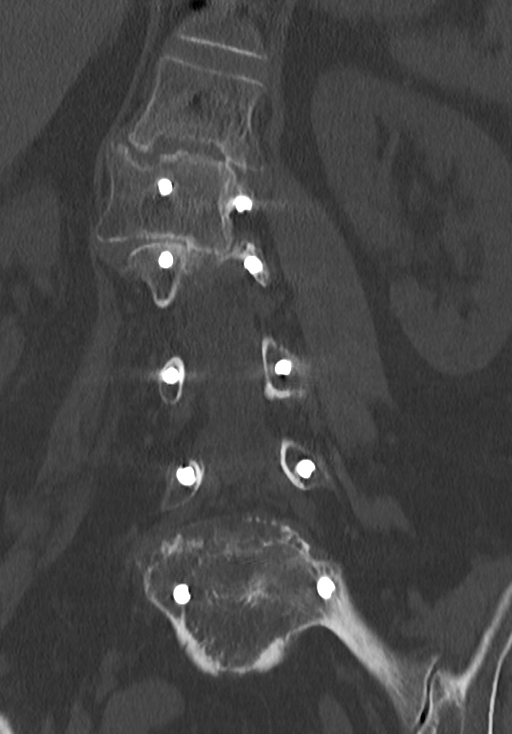
[im 59/79  bone]
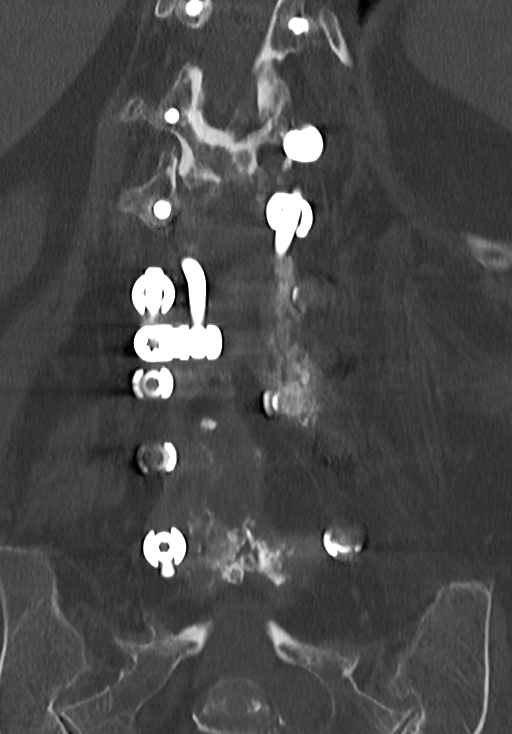

[12 of 33 positions shown; findings below may reference images not displayed]

FINDINGS: Segmentation: Transitional anatomy, with sacralized L5 level and
hypoplastic ribs at T12 as previously noted last year. Subsequently,
the fusion terminates at the sacralized L5 level.

Alignment: Moderate dextroconvex scoliosis with apex at the L1
level. Stable lumbar lordosis, with mild chronic retrolisthesis of
L1 on L2.

Vertebrae: Osteopenia. Stable bone mineralization from last year.
Postoperative details below. No superimposed acute osseous
abnormality identified.

Paraspinal and other soft tissues: Stable noncontrast visible
abdominal viscera. Calcified aortic atherosclerosis. Stable
postoperative changes to the lumbar paraspinal soft tissues.

Disc levels:

T12-L1: Stable somewhat medial position of both T12 pedicle screws
and the right L1 pedicle screw (series 7, image 31) as before.

No hardware loosening. Prior posterior decompression. Globular
probable chronic calcified caudal disc extrusion posterior to the L1
body is stable (series 7, image 34). Evidence of right side
posterior element arthrodesis, stable.

L1-L2: Stable hardware without evidence of loosening. Right L1 screw
detailed above. Prior decompression. Posterior element arthrodesis
suspected on the left, along with some right posterolateral
interbody arthrodesis. This level is stable.

L2-L3: Stable hardware without evidence of loosening. Solid
interbody and probably also posterior element arthrodesis. Prior
decompression. This level is stable.

L3-L4: Stable hardware without evidence of loosening. Prior
decompression. Solid interbody and right posterior element
arthrodesis. This level is stable.

L4-L5: Stable hardware without evidence of loosening. Somewhat
medial course of the right L5 pedicle screw as before (series 7,
image 90).

Prior decompression. Underlying L5 spina bifida occulta. Solid
interbody and posterior element arthrodesis. This level is stable.

L5-S1:  Sacralized.
IMPRESSION: 1. Transitional anatomy with sacralized L5 level and hypoplastic
ribs at T12 corresponding to the total spine CT myelogram numbering
last year. Operative fusion terminates at the sacralized L5 level.

2. Stable hardware without evidence of loosening. Stable appearance
of decompression and fusion from last year with solid interbody
and/or posterior element arthrodesis from T12 through the sacrum.

3. Aortic Atherosclerosis ([id number]).

## 2021-08-12 IMAGING — CT CT PELVIS W/O CM
2 of 6 series · 11 of 46 positions shown, 12 images · non-contrast
Comparison: CT thoracic and lumbar spine today. Total CT myelogram
04/02/2019.

CLINICAL DATA: 69-year-old female with prior thoracic and
lumbosacral fusion. Back pain. Query hardware loosening.

EXAM:
CT PELVIS WITHOUT CONTRAST
TECHNIQUE: Multidetector CT imaging of the pelvis was performed following the
standard protocol without intravenous contrast.

[Series 10: pelvis 2.00 br40 s3 axial st · axial · 0.42mm/px · z∈[-1316,-1176]mm · 8 of 87 slices shown, 9 images (1 of 2)]
[im 11/87  soft-tissue]
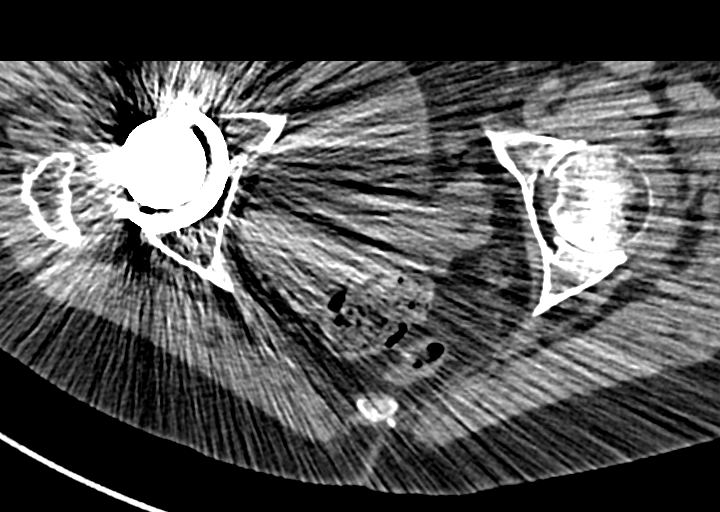
[im 11/87  bone]
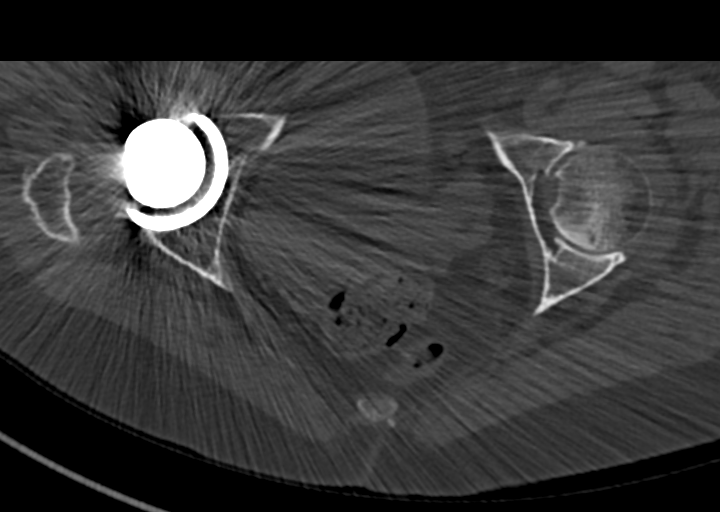
[im 22/87  soft-tissue]
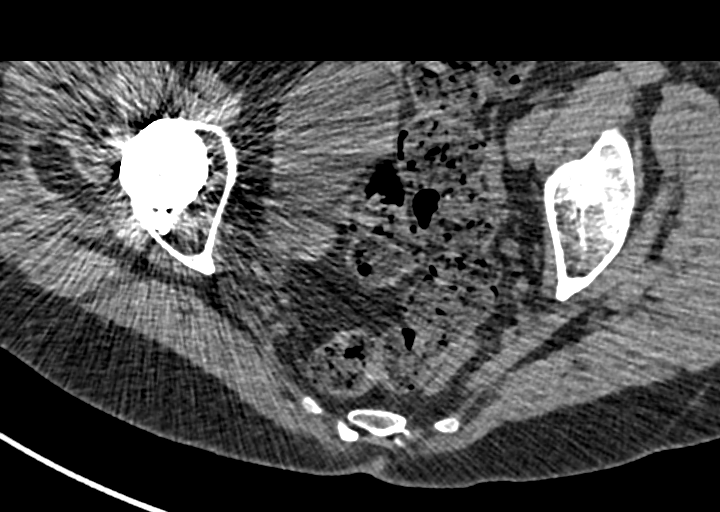
[im 33/87  soft-tissue]
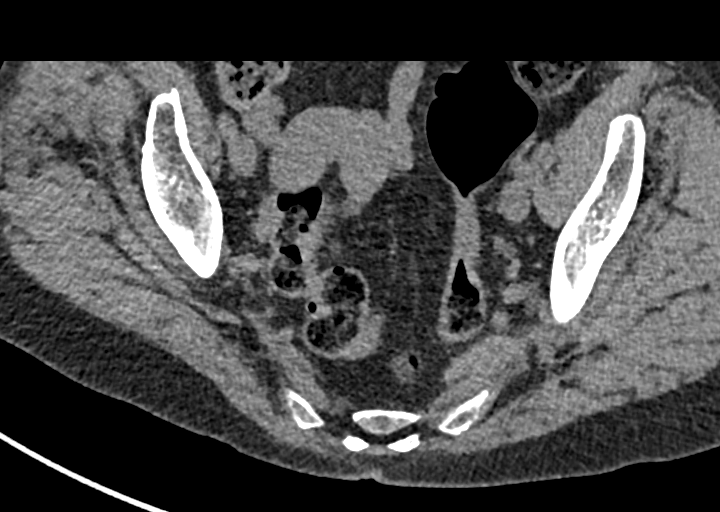
[im 44/87  soft-tissue]
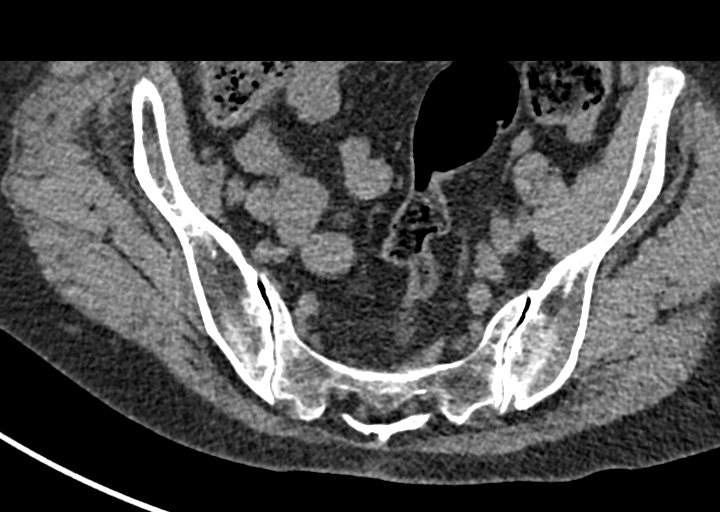
[im 49/87  soft-tissue]
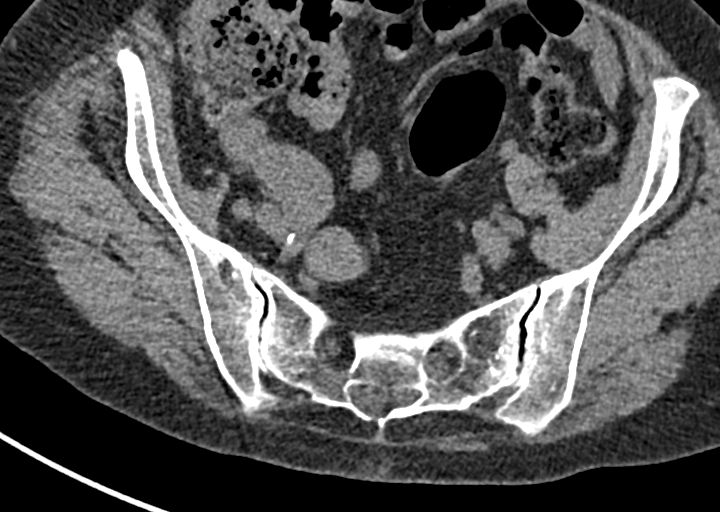
[im 60/87  soft-tissue]
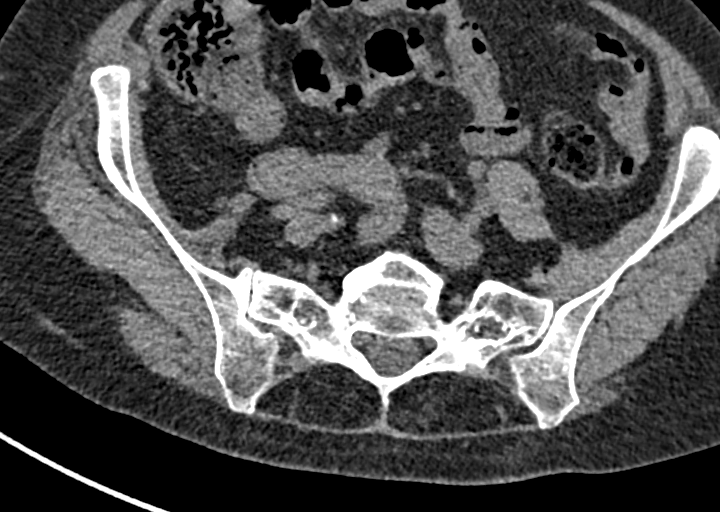
[im 70/87  soft-tissue]
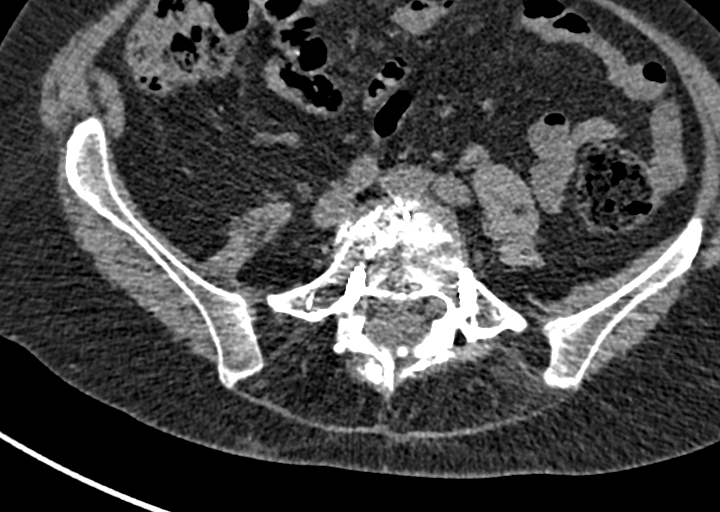
[im 81/87  soft-tissue]
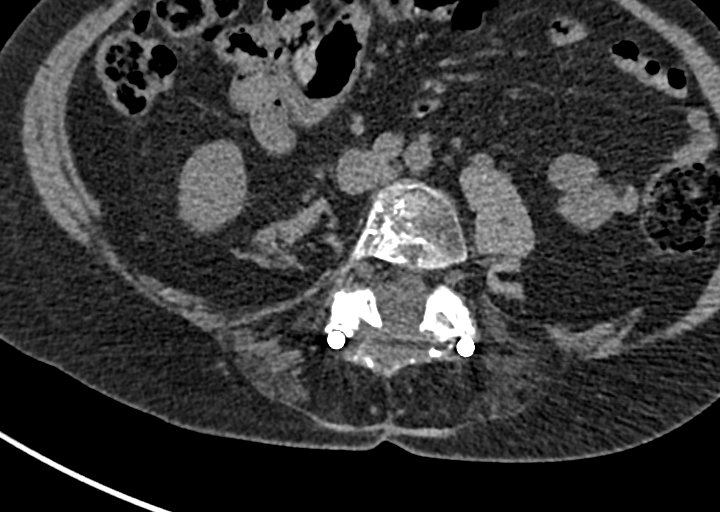

[Series 12: pelvis 2.00 br40 s3 axial st · coronal · 0.34mm/px · 3 of 104 slices shown (2 of 2)]
[im 21/104  soft-tissue]
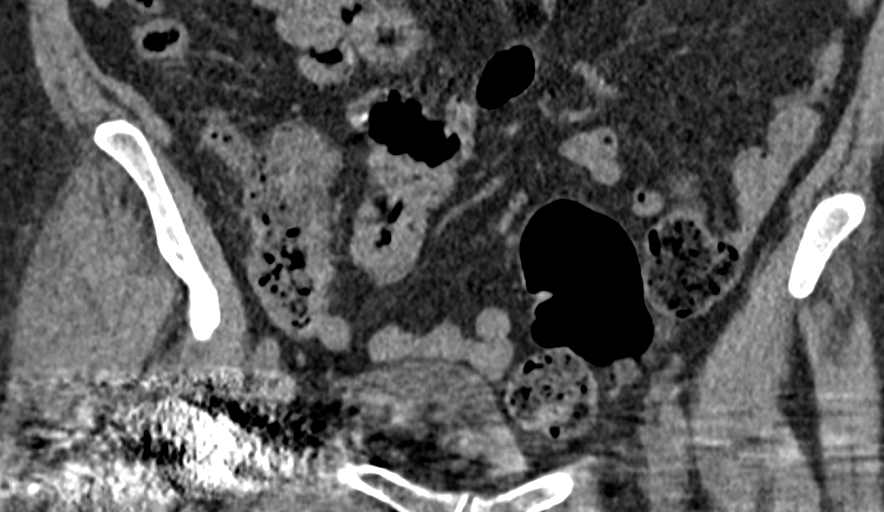
[im 42/104  soft-tissue]
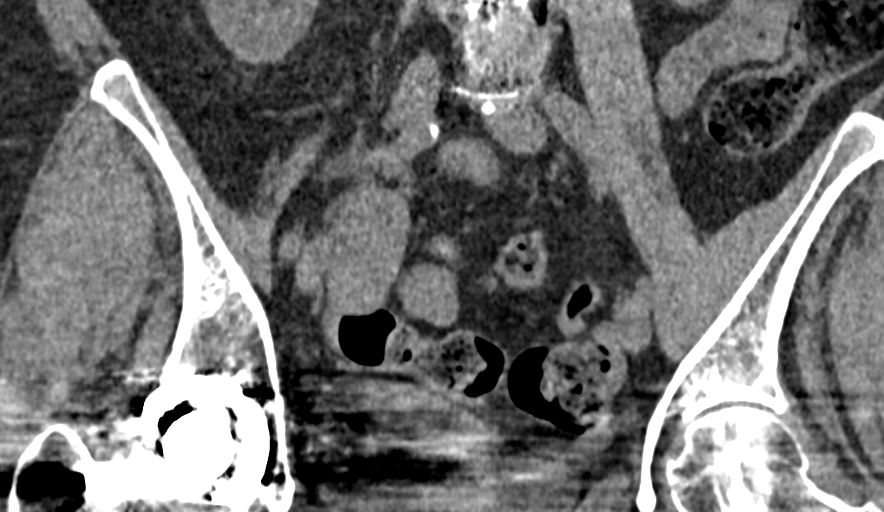
[im 62/104  soft-tissue]
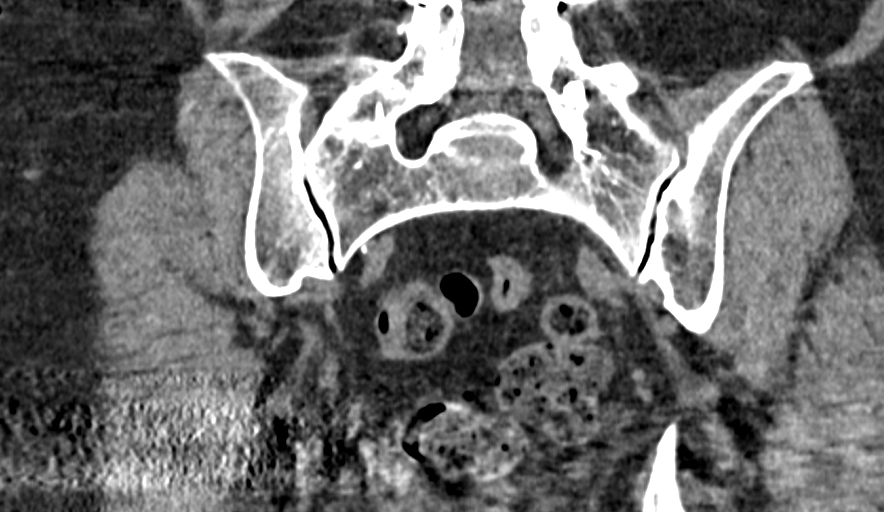

[11 of 46 positions shown; findings below may reference images not displayed]

FINDINGS: This noncontrast CT of the pelvis is tailored to evaluation of the
sacrum.

Transitional anatomy with sacralized L5 level as noted on the total
spine CT myelogram last year. Subsequently, the spinal fusion
terminates at the sacralized L5 level.

Hardware at that level appears stable without evidence of loosening.
Somewhat medial position of the right L5 screw again noted (series
4, image 9).

Congenitally fused left L5-S1 articulation. There is a right side
L5-S1 assimilation joint which also appears ankylosed.

Intact sacrum and SI joints.

Partially visible right total hip arthroplasty, no adverse features
identified. Other visible pelvic bones intact.

Aortoiliac calcified atherosclerosis. Negative visible noncontrast
lower abdominal and pelvic viscera.
IMPRESSION: 1. Transitional anatomy, with spinal fusion terminating at the
sacralized L5 level. No hardware loosening at that level. Sacralized
L5 and sacrum, SI joints intact.

2. No acute osseous abnormality identified. Previous right hip
arthroplasty with no adverse features.

## 2021-08-15 DIAGNOSIS — E118 Type 2 diabetes mellitus with unspecified complications: Secondary | ICD-10-CM

## 2021-08-15 DIAGNOSIS — M459 Ankylosing spondylitis of unspecified sites in spine: Secondary | ICD-10-CM

## 2021-08-15 DIAGNOSIS — E785 Hyperlipidemia, unspecified: Secondary | ICD-10-CM | POA: Diagnosis not present

## 2021-08-15 DIAGNOSIS — I10 Essential (primary) hypertension: Secondary | ICD-10-CM | POA: Diagnosis not present

## 2021-08-15 DIAGNOSIS — F3289 Other specified depressive episodes: Secondary | ICD-10-CM | POA: Diagnosis not present

## 2021-08-25 ENCOUNTER — Ambulatory Visit (HOSPITAL_BASED_OUTPATIENT_CLINIC_OR_DEPARTMENT_OTHER): Payer: Medicare Other | Attending: Physician Assistant | Admitting: Physical Therapy

## 2021-08-25 DIAGNOSIS — R2689 Other abnormalities of gait and mobility: Secondary | ICD-10-CM | POA: Diagnosis not present

## 2021-08-25 DIAGNOSIS — M5459 Other low back pain: Secondary | ICD-10-CM | POA: Diagnosis present

## 2021-08-25 DIAGNOSIS — M25551 Pain in right hip: Secondary | ICD-10-CM | POA: Diagnosis not present

## 2021-08-25 NOTE — Therapy (Signed)
?OUTPATIENT PHYSICAL THERAPY THORACOLUMBAR EVALUATION ? ? ?Patient Name: Vanessa Harmon ?MRN: [id number] ?DOB:02-05-1951, 71 y.o., female ?Today's Date: 08/26/2021 ? ? PT End of Session - 08/26/21 1052   ? ? Visit Number 1   ? Number of Visits 12   ? Date for PT Re-Evaluation 10/07/21   ? PT Start Time 1345   ? PT Stop Time 2423   ? PT Time Calculation (min) 44 min   ? Activity Tolerance Patient tolerated treatment well   ? Behavior During Therapy [facility] for tasks assessed/performed   ? ?  ?  ? ?  ? ? ?Past Medical History:  ?Diagnosis Date  ? Adenomatous polyp   ? Allergy   ? Anxiety   ? takes Ativan daily as needed  ? Arthritis   ? inflammatory arthritis (Dr. Dagoberto Ligas)  ? Asthma   ? Cataract   ? Chronic back pain   ? scoliosis   ? Constipation   ? takes miralax every other day  ? Depression   ? takes Lexapro daily  ? Eczema   ? Essential hypertension, benign   ? takes Hyzaar and Metoprolol daily  ? Family history of adverse reaction to anesthesia   ? Sister PONV  ? Fibromyalgia   ? GERD (gastroesophageal reflux disease)   ? History of colon polyps   ? History of shingles   ? Hyperlipidemia   ? takes Atorvastatin daily  ? Insomnia   ? takes Melatonin nightly  ? Joint pain   ? Osteoarthritis   ? Osteoporosis   ? PSVT (paroxysmal supraventricular tachycardia) ([place])   ? Recurrent upper respiratory infection (URI)   ? Rheumatoid arthritis ([place])   ? sees Dr. Amil Amen; stable on Orencia  ? Rhinitis, allergic   ? uses Flonase daily  ? Urinary urgency   ? Urticaria   ? ?Past Surgical History:  ?Procedure Laterality Date  ? ADENOIDECTOMY    ? APPENDECTOMY    ? BACK SURGERY  2012  ? at age 60 d/t scoliosis  ? BLEPHAROPLASTY  2018  ? BREAST BIOPSY    ? cataract surgery    ? COLONOSCOPY    ? HARDWARE REMOVAL Left 02/24/2020  ? Procedure: HARDWARE REMOVAL LEFT DISTAL RADIUS;  Surgeon: Leanora Cover, MD;  Location: [place];  Service: Orthopedics;  Laterality: Left;  ? KNEE ARTHROSCOPY Bilateral   ? neuroma  removed from foot    ? unsure of which foot  ? OPEN REDUCTION INTERNAL FIXATION (ORIF) DISTAL RADIAL FRACTURE Left 08/15/2019  ? Procedure: OPEN REDUCTION INTERNAL FIXATION (ORIF) DISTAL RADIAL FRACTURE;  Surgeon: Leanora Cover, MD;  Location: [place];  Service: Orthopedics;  Laterality: Left;  block in preop  ? POSTERIOR CERVICAL FUSION/FORAMINOTOMY N/A 05/29/2020  ? Procedure: Removal of hardware, Replacement of thoracic 8, thoracic 9, thoracic 10 screws, Right Thoracic 6-7, Right Thoracic 7-8 Laminectomies with thoracic hooks from Thoracic 4-5 to Thoracic 10;  Surgeon: Erline Levine, MD;  Location: [place];  Service: Neurosurgery;  Laterality: N/A;  ? POSTERIOR LUMBAR FUSION 4 LEVEL N/A 02/15/2018  ? Procedure: Decompression and fusion Thoracic nine to Lumbar two with exploration of previous fusion;  Surgeon: Erline Levine, MD;  Location: [place];  Service: Neurosurgery;  Laterality: N/A;  ? SHOULDER SURGERY Right   ? TONSILLECTOMY    ? TOTAL HIP ARTHROPLASTY Right 04/15/2014  ? Procedure: RIGHT TOTAL HIP ARTHROPLASTY ANTERIOR APPROACH;  Surgeon: Renette Butters, MD;  Location: [place];  Service: Orthopedics;  Laterality: Right;  ? TOTAL KNEE ARTHROPLASTY Left 11/26/2019  ? Procedure: TOTAL KNEE ARTHROPLASTY;  Surgeon: Paralee Cancel, MD;  Location: [facility];  Service: Orthopedics;  Laterality: Left;  70 mins  ? ?Patient Active Problem List  ? Diagnosis Date Noted  ? Posterior vitreous detachment of left eye 09/29/2020  ? Thoracic spondylosis with cord compression 05/29/2020  ? Heterotopic ossification 03/30/2020  ? Loosening of hardware in spine ([place]) 01/27/2020  ? Posterior vitreous detachment of right eye 01/21/2020  ? Pseudophakia 01/21/2020  ? Horseshoe retinal tear of right eye 01/21/2020  ? Body mass index (BMI) 28.0-28.9, adult 11/27/2019  ? Left knee OA 11/26/2019  ? Numbness of foot 02/07/2019  ? Rheumatoid arthritis ([place]) 01/25/2019  ? Peripheral polyneuropathy 01/14/2019  ? Spinal stenosis,  thoracolumbar region 01/14/2019  ? Thoracogenic scoliosis, thoracolumbar region 01/14/2019  ? Idiopathic scoliosis of thoracolumbar region 02/15/2018  ? Rhinitis, chronic 05/16/2017  ? Moderate persistent asthma without complication 02/09/8526  ? Polymyalgia rheumatica ([place]) 02/15/2015  ? DJD (degenerative joint disease) 04/15/2014  ? Lumbar spine scoliosis 11/28/2013  ? Degenerative spondylolisthesis 10/09/2013  ? Lumbar radiculopathy 09/16/2013  ? Lumbosacral radiculitis 07/31/2013  ? Scoliosis, or kyphoscoliosis, idiopathic 07/31/2013  ? Lumbar back pain 12/19/2011  ? Hyperlipidemia 10/16/2010  ? Depression 10/16/2010  ? Lumbar spondylosis 10/16/2010  ? Gastroesophageal reflux disease 10/16/2010  ? Adenomatous polyp of colon 10/16/2010  ? History of paroxysmal supraventricular tachycardia 10/16/2010  ? Essential (primary) hypertension 10/16/2010  ? ? ?PCP: Inda Coke PA ? ?REFERRING PROVIDER: Inda Coke PA  ? ?REFERRING DIAG: Lower and Upper Back Pain ? ?THERAPY DIAG:  ?Other low back pain ? ?Pain in right hip ? ?Other abnormalities of gait and mobility ? ?ONSET DATE:  ? ?SUBJECTIVE:                                                                                                                                                                                          ? ?SUBJECTIVE STATEMENT: ?The patient has an extensive history of lumbar/thoracic pain and surgeries. She also has multi-joint OA and RA. She had a fusion in February of 2022. Since that point she has continued to have pain> She has pain in her upper and lower back.  ?PERTINENT HISTORY:  ?RA, OA Left knee replacement and right hip replacement; Bursitis in both hips; multi low back surgeries; anxiety, fibromyalgia; Arythmia;  ? ?PAIN:  ?Are you having pain? Yes: NPRS scale: 7/10 ?Pain location: Thoracic pain  ?Pain description: constant aching can be sharp   ?Aggravating factors: using her arms  ?Relieving factors: Not much  ? ?PAIN:  ?Are you  having  pain? Yes ?VAS scale: 5/10 ?Pain location: Low back ?Pain orientation: Bilateral and Lower  ?PAIN TYPE: aching and dull ?Pain description: intermittent  ?Aggravating factors: standing  ?Relieving factors: not much   ? ? ?PRECAUTIONS: has had stress fractures: had one in her feet ? ?WEIGHT BEARING RESTRICTIONS No ? ?FALLS:  ?Has patient fallen in last 6 months? Yes. Number of falls falls frequently; had a fall last night after stepping on something ? ?LIVING ENVIRONMENT: ?Has steps into her house and deck steps; they can hurt her back  ? ?OCCUPATION: retired  ? ?Hobbies: gardening; dog training and showing ? ?PLOF: Independent ? ?PATIENT GOALS  ? ?To build a pool program  ? ? ?OBJECTIVE:  ? ?DIAGNOSTIC FINDINGS:  ?Nothing recent  ? ?PATIENT SURVEYS:  ?FOTO   ? ?SCREENING FOR RED FLAGS: ?Bowel or bladder incontinence: No ?Spinal tumors: No ?Cauda equina syndrome: No ?Compression fracture: No ?Abdominal aneurysm: No ? ?COGNITION: ? Overall cognitive status: Within functional limits for tasks assessed   ?  ?SENSATION: ?WFL ? ?MUSCLE LENGTH: ? ?POSTURE:  ?Flat thoracic spine  ? ?PALPATION: ? ? ?LUMBAR ROM:  ? ?Active  A/PROM  ?08/26/2021  ?Flexion Full but painful   ?Extension Can extend without much pain   ?Right lateral flexion   ?Left lateral flexion   ?Right rotation   ?Left rotation More pain to the left   ? (Blank rows = not tested) ? ?LE ROM: ?Full PROM  ? ? ? ?LE MMT: ? ?MMT Right ?08/26/2021 Left ?08/26/2021  ?Hip flexion 5.5  9.6  ?Hip extension    ?Hip abduction 17.8 20.1  ?Hip adduction    ?Hip internal rotation    ?Hip external rotation    ?Knee flexion    ?Knee extension 18.9 28.3  ?Ankle dorsiflexion    ?Ankle plantarflexion    ?Ankle inversion    ?Ankle eversion    ? (Blank rows = not tested) ? ? ? ?GAIT: ?Laterla movement to the right; decreaseed hip flexion and hip ortation ? ?TODAY'S TREATMENT  ?Reviewed use of thera-cane and where to purchase one  ? ? ?PATIENT EDUCATION:  ?Education details: self  soft tissue mobilization; benefits of the pool ?Person educated: Patient ?Education method: Explanation, Demonstration, Tactile cues, Verbal cues, and Handouts ?Education comprehension: verbalized underst

## 2021-08-26 ENCOUNTER — Encounter (HOSPITAL_BASED_OUTPATIENT_CLINIC_OR_DEPARTMENT_OTHER): Payer: Self-pay | Admitting: Physical Therapy

## 2021-09-03 ENCOUNTER — Encounter (HOSPITAL_BASED_OUTPATIENT_CLINIC_OR_DEPARTMENT_OTHER): Payer: Self-pay | Admitting: Physical Therapy

## 2021-09-03 ENCOUNTER — Ambulatory Visit (HOSPITAL_BASED_OUTPATIENT_CLINIC_OR_DEPARTMENT_OTHER): Payer: Medicare Other | Admitting: Physical Therapy

## 2021-09-03 DIAGNOSIS — M5459 Other low back pain: Secondary | ICD-10-CM

## 2021-09-03 DIAGNOSIS — M25551 Pain in right hip: Secondary | ICD-10-CM

## 2021-09-03 DIAGNOSIS — R2689 Other abnormalities of gait and mobility: Secondary | ICD-10-CM

## 2021-09-03 NOTE — Therapy (Addendum)
OUTPATIENT PHYSICAL THERAPY THORACOLUMBAR EVALUATION/Discharge   Patient Name: Vanessa Harmon MRN: [id number] DOB:09-10-1950, 71 y.o., female Today's Date: 09/03/2021   PT End of Session - 09/03/21 1531     Visit Number 2    Number of Visits 12    Date for PT Re-Evaluation 10/07/21    PT Start Time 1340    PT Stop Time 1420    PT Time Calculation (min) 40 min    Activity Tolerance Patient tolerated treatment well    Behavior During Therapy WFL for tasks assessed/performed             Past Medical History:  Diagnosis Date   Adenomatous polyp    Allergy    Anxiety    takes Ativan daily as needed   Arthritis    inflammatory arthritis (Dr. Dagoberto Ligas)   Asthma    Cataract    Chronic back pain    scoliosis    Constipation    takes miralax every other day   Depression    takes Lexapro daily   Eczema    Essential hypertension, benign    takes Hyzaar and Metoprolol daily   Family history of adverse reaction to anesthesia    Sister PONV   Fibromyalgia    GERD (gastroesophageal reflux disease)    History of colon polyps    History of shingles    Hyperlipidemia    takes Atorvastatin daily   Insomnia    takes Melatonin nightly   Joint pain    Osteoarthritis    Osteoporosis    PSVT (paroxysmal supraventricular tachycardia) ([facility])    Recurrent upper respiratory infection (URI)    Rheumatoid arthritis ([place])    sees Dr. Amil Amen; stable on Orencia   Rhinitis, allergic    uses Flonase daily   Urinary urgency    Urticaria    Past Surgical History:  Procedure Laterality Date   ADENOIDECTOMY     APPENDECTOMY     BACK SURGERY  2012   at age 31 d/t scoliosis   BLEPHAROPLASTY  2018   BREAST BIOPSY     cataract surgery     COLONOSCOPY     HARDWARE REMOVAL Left 02/24/2020   Procedure: HARDWARE REMOVAL LEFT DISTAL RADIUS;  Surgeon: Leanora Cover, MD;  Location: [place];  Service: Orthopedics;  Laterality: Left;   KNEE ARTHROSCOPY Bilateral     neuroma removed from foot     unsure of which foot   OPEN REDUCTION INTERNAL FIXATION (ORIF) DISTAL RADIAL FRACTURE Left 08/15/2019   Procedure: OPEN REDUCTION INTERNAL FIXATION (ORIF) DISTAL RADIAL FRACTURE;  Surgeon: Leanora Cover, MD;  Location: [place];  Service: Orthopedics;  Laterality: Left;  block in preop   POSTERIOR CERVICAL FUSION/FORAMINOTOMY N/A 05/29/2020   Procedure: Removal of hardware, Replacement of thoracic 8, thoracic 9, thoracic 10 screws, Right Thoracic 6-7, Right Thoracic 7-8 Laminectomies with thoracic hooks from Thoracic 4-5 to Thoracic 10;  Surgeon: Erline Levine, MD;  Location: [place];  Service: Neurosurgery;  Laterality: N/A;   POSTERIOR LUMBAR FUSION 4 LEVEL N/A 02/15/2018   Procedure: Decompression and fusion Thoracic nine to Lumbar two with exploration of previous fusion;  Surgeon: Erline Levine, MD;  Location: [place];  Service: Neurosurgery;  Laterality: N/A;   SHOULDER SURGERY Right    TONSILLECTOMY     TOTAL HIP ARTHROPLASTY Right 04/15/2014   Procedure: RIGHT TOTAL HIP ARTHROPLASTY ANTERIOR APPROACH;  Surgeon: Renette Butters, MD;  Location: [place];  Service: Orthopedics;  Laterality: Right;   TOTAL KNEE ARTHROPLASTY Left 11/26/2019   Procedure: TOTAL KNEE ARTHROPLASTY;  Surgeon: Paralee Cancel, MD;  [facility];  Service: Orthopedics;  Laterality: Left;  70 mins   Patient Active Problem List   Diagnosis Date Noted   Posterior vitreous detachment of left eye 09/29/2020   Thoracic spondylosis with cord compression 05/29/2020   Heterotopic ossification 03/30/2020   Loosening of hardware in spine ([place]) 01/27/2020   Posterior vitreous detachment of right eye 01/21/2020   Pseudophakia 01/21/2020   Horseshoe retinal tear of right eye 01/21/2020   Body mass index (BMI) 28.0-28.9, adult 11/27/2019   Left knee OA 11/26/2019   Numbness of foot 02/07/2019   Rheumatoid arthritis ([place]) 01/25/2019   Peripheral polyneuropathy 01/14/2019   Spinal  stenosis, thoracolumbar region 01/14/2019   Thoracogenic scoliosis, thoracolumbar region 01/14/2019   Idiopathic scoliosis of thoracolumbar region 02/15/2018   Rhinitis, chronic 05/16/2017   Moderate persistent asthma without complication 95/28/4132   Polymyalgia rheumatica ([place]) 02/15/2015   DJD (degenerative joint disease) 04/15/2014   Lumbar spine scoliosis 11/28/2013   Degenerative spondylolisthesis 10/09/2013   Lumbar radiculopathy 09/16/2013   Lumbosacral radiculitis 07/31/2013   Scoliosis, or kyphoscoliosis, idiopathic 07/31/2013   Lumbar back pain 12/19/2011   Hyperlipidemia 10/16/2010   Depression 10/16/2010   Lumbar spondylosis 10/16/2010   Gastroesophageal reflux disease 10/16/2010   Adenomatous polyp of colon 10/16/2010   History of paroxysmal supraventricular tachycardia 10/16/2010   Essential (primary) hypertension 10/16/2010    PCP: Inda Coke PA  REFERRING PROVIDER: Inda Coke PA   REFERRING DIAG: Lower and Upper Back Pain  THERAPY DIAG:  Other low back pain  Pain in right hip  Other abnormalities of gait and mobility  ONSET DATE:   SUBJECTIVE:                                                                                                                                                                                           SUBJECTIVE STATEMENT: The patient has an extensive history of lumbar/thoracic pain and surgeries. She also has multi-joint OA and RA. She had a fusion in February of 2022. Since that point she has continued to have pain> She has pain in her upper and lower back.  PERTINENT HISTORY:  RA, OA Left knee replacement and right hip replacement; Bursitis in both hips; multi low back surgeries; anxiety, fibromyalgia; Arythmia;   PAIN:  Are you having pain? Yes: NPRS scale: 7/10 Pain location: Thoracic pain  Pain description: constant aching can be sharp   Aggravating factors: using her arms  Relieving factors: Not much   PAIN:   Are you having  pain? Yes VAS scale: 5/10 Pain location: Low back Pain orientation: Bilateral and Lower  PAIN TYPE: aching and dull Pain description: intermittent  Aggravating factors: standing  Relieving factors: not much     PRECAUTIONS: has had stress fractures: had one in her feet  WEIGHT BEARING RESTRICTIONS No  FALLS:  Has patient fallen in last 6 months? Yes. Number of falls falls frequently; had a fall last night after stepping on something  LIVING ENVIRONMENT: Has steps into her house and deck steps; they can hurt her back   OCCUPATION: retired   Office manager: gardening; Merchandiser, retail and showing  PLOF: Independent  PATIENT GOALS   To build a pool program    OBJECTIVE:  Pt seen for aquatic therapy today.  Treatment took place in water 3.25-4 ft in depth at the [facility] pool. Temp of water was 91.  Pt entered/exited the pool via stairs (step through pattern) independently with bilat rail.  Introduction to water. Had patient stand at different levels so she could feel the bouncy   Warm up: heel/toe walking x4 laps across pool chest deep side stepping x4 laps from shallow to deep; long stride 2 laps; side stride and step 2 laps Hip opener laps;   Exercises; Slow march x20; hip extension x20; hip abduction x20; Sit to stand x20;     board trunk flexion x10 lateral board rotation x10 each way seated   With neck noddle and foot float reviewed set up of float position float 3 min; bicyle kick with float 2 min   With shoulders submerged rhythmic movement of the shoulders in several directions   Rhomboid stretch submerged x10 ( reported some pain)     Pt requires buoyancy for support and to offload joints with strengthening exercises. Viscosity of the water is needed for resistance of strengthening; water current perturbations provides challenge to standing balance unsupported, requiring increased core activation.  PATIENT EDUCATION:  Education details:  updated HEP  Person educated: Patient Education method: Explanation, Demonstration, Tactile cues, Verbal cues, and Handouts Education comprehension: verbalized understanding, returned demonstration, verbal cues required, tactile cues required, and needs further education   HOME EXERCISE PROGRAM: Has a solid land exercise program from previous therapist  ASSESSMENT:  CLINICAL IMPRESSION: Patient was sore today to start with therapy. We tried to work on relaxation techniques and exercises to stretch her back. She did not tolerate very well. She reported no significant improvement in pain. She feels like she may be sore after. We reviewed the float positioning and movement of the shoulders below the water for relaxation. She was also given base hip exercises. Her upper back pain limited her ability to use her arms for core stability. If she is having less pain in her T-spine we will consider doing UE core exercises with the weights.  OBJECTIVE IMPAIRMENTS Abnormal gait, decreased activity tolerance, decreased mobility, difficulty walking, decreased ROM, decreased strength, impaired perceived functional ability, and pain.   ACTIVITY LIMITATIONS cleaning, community activity, meal prep, and shopping.   PERSONAL FACTORS RA, OA Left knee replacement and right hip replacement; Bursitis in both hips; multi low back surgeries; anxiety, fibromyalgia; Arythmia; are also affecting patient's functional outcome.    REHAB POTENTIAL: Good  CLINICAL DECISION MAKING: Evolving/moderate complexity declining overall mobility   EVALUATION COMPLEXITY: Moderate   GOALS: Goals reviewed with patient? No  SHORT TERM GOALS: Target date: 09/24/2021    (Remove Blue Hyperlink)   Patient will report no pain with functional forward flexion  Baseline: Goal  status: INITIAL  2.  Patient will increase strength by 5lbs gross in bilateral LE  Baseline:  Goal status: INITIAL  3.  Patient will be independent with base  pool program  Baseline:  Goal status: INITIAL   LONG TERM GOALS: Target date: 10/15/2021  (Remove Blue Hyperlink)  Patient will walk in the community without significant pain  Baseline:  Goal status: INITIAL  2.  Patient will have a full pool program  Baseline:  Goal status: INITIAL PLAN: PT FREQUENCY: 1-2x/week  PT DURATION: 6 weeks  PLANNED INTERVENTIONS: Therapeutic exercises, Therapeutic activity, Neuromuscular re-education, Balance training, Gait training, Patient/Family education, Joint mobilization, Aquatic Therapy, Dry Needling, Electrical stimulation, Cryotherapy, Moist heat, Taping, and Manual therapy.  PLAN FOR NEXT SESSION: begin basic core and hip strengthening program. Review stretches in the water. Patient is not sure she is going to like the pool. If she doesn't D/C to HEP. If she does, build her a program.    [place] SUMMARY  Visits from Start of Care: 2  Current functional level related to goals / functional outcomes: Patient has a full HEP. She did not preffer the water over land therapy. D/C to HEP    Remaining deficits:    Education / Equipment: HEP   Patient agrees to discharge. Patient goals were not met. Patient is being discharged due to the patient's request.   Carney Living, PT DPT  09/03/2021, 3:42 PM

## 2021-09-07 ENCOUNTER — Ambulatory Visit (HOSPITAL_BASED_OUTPATIENT_CLINIC_OR_DEPARTMENT_OTHER): Payer: Medicare Other | Admitting: Physical Therapy

## 2021-09-09 NOTE — Progress Notes (Signed)
Office Visit Note  Patient: Vanessa Harmon             Date of Birth: [birth year]           MRN: [id number]             PCP: Inda Coke, PA Referring: Inda Coke, PA Visit Date: 09/10/2021 Occupation: Retired Marine scientist  Subjective:  New Patient (Initial Visit) (Transfer of care)   History of Present Illness: Vanessa Harmon is a 71 y.o. female here for evaluation of chronic back pain associated with seropositive RA she has been seeing [name] on treatment with orencia IV, MTX 15 mg [place] weekly, leucovorin 1 mg daily, and prednisone 5 mg daily.  She was originally diagnosed with inflammatory arthritis by Dr. [name] about a decade ago.  Apparently the worst initial symptom at that time was left hip pain and inflammation with concerning findings on MRI.  Was started on methotrexate as a steroid sparing DMARD treatment.  Apparently had initial concern for seronegative rheumatoid arthritis versus PMR presentation.  She did not tolerate methotrexate well initially worst side effect was substantial hair loss.  Also did not tolerate well on leflunomide.  She was started on infliximab treatment developed a significant adverse reaction with new skin rashes in multiple areas and severe finger and toe nail changes that lasted about a year after discontinuing the medication.  She was started on IV Orencia monthly infusions which has been beneficial for symptoms but still notices some amount ongoing and that symptoms tend to worsen between each dosing interval.  She was restarted on methotrexate this time with leucovorin in the past few months and this has improved her joint inflammation further.  She remains on low-dose prednisone 5 mg daily throughout most of this time.  Most recent definite flareup of her rheumatoid arthritis was about 6 months ago. In addition to inflammatory joint disease she has significant amount of generalized osteoarthritis.  She reports bone-on-bone degenerative changes  with slightly impaired range of motion in the left shoulder.  She had previous left knee total arthroplasty.  She has degenerative arthritis at multiple levels in the spine with history of repeat lumbar spine surgery.  She has some spinal and foraminal stenosis with radicular symptoms.  She is also had recurrent problems with hip bursitis has been responsive to local steroid injection treatments with Dr. [name].  She takes gabapentin 300 mg at night and as needed Robaxin 500 mg for the ongoing symptoms. She has not had any history with recurrent infections or major side effects on her current regimen.  She does have extensive bruising mostly worst in the past year and worst on extensor surfaces of arms and legs.  She denies any history of abnormal bleeding and blood clots. She has chronic dry eyes and sees Dr. [name] currently uses xiidra and genteal drops for this.   Activities of Daily Living:  Patient reports morning stiffness for 1 hour.   Patient Reports nocturnal pain.  Difficulty dressing/grooming: Denies Difficulty climbing stairs: Reports Difficulty getting out of chair: Denies Difficulty using hands for taps, buttons, cutlery, and/or writing: Reports  Review of Systems  Constitutional:  Positive for fatigue.  HENT:  Positive for mouth dryness.   Eyes:  Positive for dryness.  Respiratory:  Negative for shortness of breath.   Cardiovascular:  Positive for swelling in legs/feet.  Gastrointestinal:  Positive for constipation.  Endocrine: Positive for cold intolerance and heat intolerance.  Genitourinary:  Negative for difficulty urinating.  Musculoskeletal:  Positive for joint pain, gait problem, joint pain, muscle weakness and morning stiffness.  Skin:  Negative for rash.  Allergic/Immunologic: Negative for susceptible to infections.  Neurological:  Positive for numbness and weakness.  Hematological:  Positive for bruising/bleeding tendency.  Psychiatric/Behavioral:  Positive for sleep  disturbance.    PMFS History:  Patient Active Problem List   Diagnosis Date Noted   High risk medication use 09/10/2021   Posterior vitreous detachment of left eye 09/29/2020   Thoracic spondylosis with cord compression 05/29/2020   Heterotopic ossification 03/30/2020   Loosening of hardware in spine ([place]) 01/27/2020   Posterior vitreous detachment of right eye 01/21/2020   Pseudophakia 01/21/2020   Horseshoe retinal tear of right eye 01/21/2020   Body mass index (BMI) 28.0-28.9, adult 11/27/2019   Left knee OA 11/26/2019   Numbness of foot 02/07/2019   Rheumatoid arthritis ([place]) 01/25/2019   Peripheral polyneuropathy 01/14/2019   Spinal stenosis, thoracolumbar region 01/14/2019   Thoracogenic scoliosis, thoracolumbar region 01/14/2019   Idiopathic scoliosis of thoracolumbar region 02/15/2018   Rhinitis, chronic 05/16/2017   Moderate persistent asthma without complication 86/57/8469   Polymyalgia rheumatica ([place]) 02/15/2015   DJD (degenerative joint disease) 04/15/2014   Lumbar spine scoliosis 11/28/2013   Degenerative spondylolisthesis 10/09/2013   Lumbar radiculopathy 09/16/2013   Lumbosacral radiculitis 07/31/2013   Scoliosis, or kyphoscoliosis, idiopathic 07/31/2013   Lumbar back pain 12/19/2011   Hyperlipidemia 10/16/2010   Depression 10/16/2010   Lumbar spondylosis 10/16/2010   Gastroesophageal reflux disease 10/16/2010   Adenomatous polyp of colon 10/16/2010   History of paroxysmal supraventricular tachycardia 10/16/2010   Essential (primary) hypertension 10/16/2010    Past Medical History:  Diagnosis Date   Adenomatous polyp    Allergy    Anxiety    takes Ativan daily as needed   Arthritis    inflammatory arthritis (Dr. Dagoberto Ligas)   Asthma    Cataract    Chronic back pain    scoliosis    Constipation    takes miralax every other day   Depression    takes Lexapro daily   Eczema    Essential hypertension, benign    takes Hyzaar and Metoprolol  daily   Family history of adverse reaction to anesthesia    Sister PONV   Fibromyalgia    GERD (gastroesophageal reflux disease)    History of colon polyps    History of shingles    Hyperlipidemia    takes Atorvastatin daily   Insomnia    takes Melatonin nightly   Joint pain    Osteoarthritis    Osteoporosis    PSVT (paroxysmal supraventricular tachycardia) (HCC)    Recurrent upper respiratory infection (URI)    Rheumatoid arthritis ([place])    sees Dr. Amil Amen; stable on Orencia   Rhinitis, allergic    uses Flonase daily   Urinary urgency    Urticaria     Family History  Problem Relation Age of Onset   Stroke Mother    Allergic rhinitis Mother    Allergic rhinitis Father    AAA (abdominal aortic aneurysm) Father    COPD Father    Prostate cancer Father    Hypertension Other        unspecified grandmother   AAA (abdominal aortic aneurysm) Other        unspecified grandfather   Cancer Other        unspecified grandfather   Neuropathy Neg Hx    Past Surgical History:  Procedure Laterality Date  ADENOIDECTOMY     APPENDECTOMY     BACK SURGERY  2012   at age 5 d/t scoliosis   BLEPHAROPLASTY  2018   BREAST BIOPSY     cataract surgery     COLONOSCOPY     HARDWARE REMOVAL Left 02/24/2020   Procedure: HARDWARE REMOVAL LEFT DISTAL RADIUS;  Surgeon: Leanora Cover, MD;  Location: [facility];  Service: Orthopedics;  Laterality: Left;   KNEE ARTHROSCOPY Bilateral    neuroma removed from foot     unsure of which foot   OPEN REDUCTION INTERNAL FIXATION (ORIF) DISTAL RADIAL FRACTURE Left 08/15/2019   Procedure: OPEN REDUCTION INTERNAL FIXATION (ORIF) DISTAL RADIAL FRACTURE;  Surgeon: Leanora Cover, MD;  Location: [place];  Service: Orthopedics;  Laterality: Left;  block in preop   POSTERIOR CERVICAL FUSION/FORAMINOTOMY N/A 05/29/2020   Procedure: Removal of hardware, Replacement of thoracic 8, thoracic 9, thoracic 10 screws, Right Thoracic 6-7,  Right Thoracic 7-8 Laminectomies with thoracic hooks from Thoracic 4-5 to Thoracic 10;  Surgeon: Erline Levine, MD;  Location: [place];  Service: Neurosurgery;  Laterality: N/A;   POSTERIOR LUMBAR FUSION 4 LEVEL N/A 02/15/2018   Procedure: Decompression and fusion Thoracic nine to Lumbar two with exploration of previous fusion;  Surgeon: Erline Levine, MD;  Location: [place];  Service: Neurosurgery;  Laterality: N/A;   SHOULDER SURGERY Right    TONSILLECTOMY     TOTAL HIP ARTHROPLASTY Right 04/15/2014   Procedure: RIGHT TOTAL HIP ARTHROPLASTY ANTERIOR APPROACH;  Surgeon: Renette Butters, MD;  Location: [facility];  Service: Orthopedics;  Laterality: Right;   TOTAL KNEE ARTHROPLASTY Left 11/26/2019   Procedure: TOTAL KNEE ARTHROPLASTY;  Surgeon: Paralee Cancel, MD;  Location: [facility];  Service: Orthopedics;  Laterality: Left;  70 mins   Social History   Social History Narrative   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   Lives with husband   Immunization History  Administered Date(s) Administered   Influenza Split 01/31/2012   Influenza Whole 02/16/2010   Influenza, High Dose Seasonal PF 12/13/2018   Influenza,inj,Quad PF,6+ Mos 01/14/2014, 01/27/2015, 02/02/2016   Influenza,inj,quad, With Preservative 02/26/2020   Influenza-Unspecified 02/24/2021   PFIZER(Purple Top)SARS-COV-2 Vaccination 05/25/2019, 06/15/2019, 02/05/2020, 08/03/2020   Pneumococcal Conjugate-13 01/27/2015   Pneumococcal Polysaccharide-23 05/26/2017   Tdap 10/04/2011   Zoster Recombinat (Shingrix) 10/13/2017, 01/02/2018     Objective: Vital Signs: BP (!) 146/72 (BP Location: Right Arm, Patient Position: Sitting, Cuff Size: Normal)   Pulse (!) 58   Resp 16   Ht '5\' 6"'$  (1.676 m)   Wt 178 lb (80.7 kg)   BMI 28.73 kg/m    Physical Exam HENT:     Mouth/Throat:     Mouth: Mucous membranes are moist.     Pharynx: Oropharynx is clear.  Eyes:     Conjunctiva/sclera: Conjunctivae normal.  Cardiovascular:     Rate and Rhythm: Normal  rate and regular rhythm.  Pulmonary:     Effort: Pulmonary effort is normal.     Breath sounds: Normal breath sounds.  Musculoskeletal:     Right lower leg: No edema.     Left lower leg: No edema.  Skin:    General: Skin is warm and dry.     Findings: Bruising present.  Neurological:     Mental Status: She is alert.  Psychiatric:        Mood and Affect: Mood normal.     Musculoskeletal Exam:  Shoulders left shoulder slightly decreased abduction external rotation  range of motion Elbows full ROM no tenderness or swelling Wrists full ROM no tenderness or swelling Mild swelling at right first MCP joint tenderness to pressure on third finger MCP and PIP without obvious synovitis, Heberden's nodes at the DIP joints of both hands significant lateral deviation of second and third DIPs Low back midline and paraspinal muscle tenderness to palpation mostly left side Left hip lateral tenderness to palpation and pain with internal and external rotation, range of motion is grossly normal Left knee with well-healed surgical scar range of motion is normal chronic soft tissue hypertrophy and some crepitus present Ankles full ROM no tenderness or swelling   CDAI Exam: CDAI Score: 10  Patient Global: 30 mm; Provider Global: 20 mm Swollen: 1 ; Tender: 5  Joint Exam 09/10/2021      Right  Left  Glenohumeral      Tender  MCP 1  Swollen      MCP 3   Tender     PIP 3   Tender     Hip      Tender  Knee      Tender     Investigation: No additional findings.  Imaging: XR Hand 2 View Left  Result Date: 09/10/2021 X-ray left hand 2 views Surgical hardware in place at distal radius.  Radiocarpal joint space appears normal.  Moderately advanced first CMC joint degenerative changes.  There is joint space narrowing increased sclerosis and lateral osteophyte process at the scaphoid.  Second MCP joint space narrowing and partial subluxation other MCP joint positions normal.  Degenerative changes  throughout PIP and DIP joints with narrowing and lateral osteophyte process.  Second and third digits with DIP joint lateral deviation.  Bone mineralization suggestive for generalized osteopenia no erosions seen. Impression Moderate to severe osteoarthritis most advanced at first [facility] joint and second and third DIPs, no erosive disease changes  XR Hand 2 View Right  Result Date: 09/10/2021 X-ray right hand 2 views Radiocarpal joint space appears normal.  There is ossicle or nonunion fragment at ulnar styloid.  Moderately advanced first [place] joint degenerative changes there is narrowing and increased sclerosis tween scaphoid and trapezium and trapezoid carpal bones.  There is narrowing and partial subluxation at second MCP joint.  Hook osteophyte process at fifth metacarpal head.  Extensive degenerative changes with joint space loss and spurring throughout PIP and DIP joints.  Significant lateral deviation at second and third digits.  Bone mineralization suggestive for generalized osteopenia no erosions are visible. Impression Moderate to severe osteoarthritis changes throughout the hand, there is generalized osteopenia that could be consistent with chronic inflammation but no erosive disease changes   Recent Labs: Lab Results  Component Value Date   WBC 8.9 07/29/2020   HGB 12.3 07/29/2020   PLT 363.0 07/29/2020   NA 139 04/05/2021   K 4.8 04/05/2021   CL 99 04/05/2021   CO2 24 (A) 04/05/2021   GLUCOSE 145 (H) 08/26/2020   BUN 13 04/05/2021   CREATININE 1.0 04/05/2021   BILITOT 0.5 07/29/2020   ALKPHOS 70 07/29/2020   AST 13 07/29/2020   ALT 14 07/29/2020   PROT 6.8 07/29/2020   ALBUMIN 4.5 04/05/2021   CALCIUM 9.4 04/05/2021   GFRAA >60 11/27/2019    Speciality Comments: No specialty comments available.  Procedures:  No procedures performed Allergies: Advair hfa [fluticasone-salmeterol], Statins, Breo ellipta [fluticasone furoate-vilanterol], Fosamax [alendronate sodium], Levaquin  [levofloxacin], Doxycycline hyclate, Infliximab, Spiriva handihaler [tiotropium bromide monohydrate], Verapamil, and Tape   Assessment /  Plan:     Visit Diagnoses: Rheumatoid arthritis involving vertebra with positive rheumatoid factor ([place]) - Plan: XR Hand 2 View Right, XR Hand 2 View Left, Rheumatoid factor, Cyclic citrul peptide antibody, IgG, Sedimentation rate, C-reactive protein  Low disease activity today but requiring multiple medications and having waning efficacy between dosing I think she could benefit with change in treatment plan.  Discussed starting Actemra weekly subcu injection as next option.  She had what sounds like possible psoriasis or at least severe skin reaction to infliximab so do not recommend alternate TNF inhibitor treatment at this time.  No specific contraindication to Jak inhibitors but has cardiovascular risk factors and age of 40.  We will recheck rheumatoid factor and CCP antibodies these have been negative in the past.  Checking sed rate and CRP for disease activity monitoring.  Bilateral hand x-rays checked in clinic today are negative for erosive disease do show some generalized osteopenia.  Also planning to continue the methotrexate 15 mg subcu weekly leucovorin 1 mg daily and prednisone 5 mg daily at this time.  Polymyalgia rheumatica (HCC)  Questionable diagnosis concern for PMR initial onset with seronegative disease with hip and shoulder predominant distribution.  I suspect Actemra to be an ideal biologic DMARD choice assuming she is able to tolerate for safety standpoint.  Lumbosacral radiculitis  Chronic multilevel degenerative arthritis in the spine does not appear directly related to her inflammatory arthritis.  Chronic pain syndrome associated with this may be worse in the setting of active inflammation though.  She need to continue following up with her spine specialist currently doing reasonably with the gabapentin and methocarbamol.  High risk  medication use - Plan: CBC with Differential/Platelet, COMPLETE METABOLIC PANEL WITH GFR, Hepatitis B core antibody, IgM, Hepatitis B surface antigen, Hepatitis C antibody, QuantiFERON-TB Gold Plus, CBC with Differential/Platelet, COMPLETE METABOLIC PANEL WITH GFR, Lipid panel  Currently doing pretty well her most obvious side effect is the extensive bruising probably related to chronic low-dose prednisone and low-dose aspirin treatment.  Checking CBC and CMP for methotrexate toxicity monitoring.  Checking baseline hepatitis serology and QuantiFERON screening anticipating starting Actemra.  She had recent lipid testing done earlier this year for management of her high cholesterol.  We will plan future orders for blood count liver function tests and lipid profile within 4 weeks after new medication start.   Orders: Orders Placed This Encounter  Procedures   XR Hand 2 View Right   XR Hand 2 View Left   CBC with Differential/Platelet   COMPLETE METABOLIC PANEL WITH GFR   Rheumatoid factor   Cyclic citrul peptide antibody, IgG   Sedimentation rate   C-reactive protein   Hepatitis B core antibody, IgM   Hepatitis B surface antigen   Hepatitis C antibody   QuantiFERON-TB Gold Plus   CBC with Differential/Platelet   COMPLETE METABOLIC PANEL WITH GFR   Lipid panel   No orders of the defined types were placed in this encounter.    Follow-Up Instructions: Return in about 3 months (around 12/11/2021) for RA on MTX/GC/TOC start f/u 84mo.   CCollier Salina MD  Note - This record has been created using [employer]  Chart creation errors have been sought, but may not always  have been located. Such creation errors do not reflect on  the standard of medical care.

## 2021-09-10 ENCOUNTER — Encounter: Payer: Self-pay | Admitting: Internal Medicine

## 2021-09-10 ENCOUNTER — Ambulatory Visit (INDEPENDENT_AMBULATORY_CARE_PROVIDER_SITE_OTHER): Payer: Medicare Other

## 2021-09-10 ENCOUNTER — Ambulatory Visit: Payer: Medicare Other | Admitting: Internal Medicine

## 2021-09-10 ENCOUNTER — Telehealth: Payer: Self-pay | Admitting: Pharmacist

## 2021-09-10 VITALS — BP 146/72 | HR 58 | Resp 16 | Ht 66.0 in | Wt 178.0 lb

## 2021-09-10 DIAGNOSIS — M353 Polymyalgia rheumatica: Secondary | ICD-10-CM

## 2021-09-10 DIAGNOSIS — M459 Ankylosing spondylitis of unspecified sites in spine: Secondary | ICD-10-CM

## 2021-09-10 DIAGNOSIS — Z79899 Other long term (current) drug therapy: Secondary | ICD-10-CM | POA: Diagnosis not present

## 2021-09-10 DIAGNOSIS — M5417 Radiculopathy, lumbosacral region: Secondary | ICD-10-CM | POA: Diagnosis not present

## 2021-09-10 DIAGNOSIS — M79642 Pain in left hand: Secondary | ICD-10-CM | POA: Diagnosis not present

## 2021-09-10 NOTE — Patient Instructions (Signed)
Tocilizumab Injection What is this medication? TOCILIZUMAB (TOE si LIZ ue mab) treats rheumatoid arthritis and juvenile idiopathic arthritis. It may also be used to treat giant cell arteritis, cytokine release syndrome, and to slow a decrease in lung function for patients with systemic sclerosis-related lung disease. It can also be used to treat COVID-19. This medication works on the immune system. This medicine may be used for other purposes; ask your health care provider or pharmacist if you have questions. COMMON BRAND NAME(S): Actemra What should I tell my care team before I take this medication? They need to know if you have any of these conditions: cancer diabetes (high blood sugar) heart disease hepatitis B or history of hepatitis B infection high blood pressure high cholesterol immune system problems infection especially a viral infection such as chickenpox, cold sores, or herpes infection such as tuberculosis (TB) or other bacterial, fungal or viral infections liver disease low blood counts, like low white cell, platelet, or red cell counts multiple sclerosis recent or upcoming vaccine stomach or intestine problems stroke an unusual or allergic reaction to tocilizumab, other medicines, foods, dyes, or preservatives pregnant or trying to get pregnant breast-feeding How should I use this medication? This medicine is injected into a vein or under the skin. It is usually given by a health care provider in a hospital or clinic setting. If you get this medicine at home, you will be taught how to prepare and give it. Use exactly as directed. Take it as directed on the prescription label at the same time every day. Keep taking it unless your health care provider tells you to stop. If you use a pen, be sure to take off the outer needle cover before using the dose. It is important that you put your used needles and syringes in a special sharps container. Do not put them in a trash can. If  you do not have a sharps container, call your pharmacist or health care provider to get one. A special MedGuide will be given to you before each treatment or by the pharmacist with each prescription and refill. Be sure to read this information carefully each time. Talk to your health care provider regarding the use of this medicine in children. While the drug may be prescribed for children as young as 2 years for selected conditions, precautions do apply. Overdosage: If you think you have taken too much of this medicine contact a poison control center or emergency room at once. NOTE: This medicine is only for you. Do not share this medicine with others. What if I miss a dose? It is important not to miss your dose. Call your health care provider if you are unable to keep an appointment. If you give yourself the medicine at home and you miss a dose, take it as soon as you can. If it is almost time for your next dose, take only that dose. Do not take double or extra doses. Call your health care provider with questions. What may interact with this medication? Do not take this medicine with any of the following medications: live virus vaccines This medicine may also interact with the following medications: biologic medicines such as abatacept, adalimumab, anakinra, certolizumab, etanercept, golimumab, infliximab, rituximab, secukinumab, ustekinumab birth control pills certain medicines for cholesterol like atorvastatin, lovastatin, and simvastatin cyclosporine omeprazole steroid medicines like prednisone or cortisone theophylline vaccines warfarin This list may not describe all possible interactions. Give your health care provider a list of all the medicines, herbs, non-prescription drugs,  or dietary supplements you use. Also tell them if you smoke, drink alcohol, or use illegal drugs. Some items may interact with your medicine. What should I watch for while using this medication? Your condition  will be monitored carefully while you are receiving this medicine. Tell your health care provider if your symptoms do not start to get better or if they get worse. You may need blood work done while you are taking this medicine. You will be tested for tuberculosis (TB) before you start this medicine. If your doctor prescribes any medicine for TB, you should start taking the TB medicine before starting this medicine. Make sure to finish the full course of TB medicine. This medicine may increase your risk of getting an infection. Call your health care provider for advice if you get a fever, chills, sore throat, or other symptoms of a cold or flu. Do not treat yourself. Try to avoid being around people who are sick. Talk to your health care provider about your risk of cancer. You may be more at risk for certain types of cancers if you take this medicine. What side effects may I notice from receiving this medication? Side effects that you should report to your doctor or health care professional as soon as possible: allergic reactions (skin rash, itching or hives; swelling of the face, lips, or tongue) changes in vision increase in blood pressure infection (fever, chills, cough, sore throat, pain or trouble passing urine) light-colored stool liver injury (dark yellow or brown urine; general ill feeling or flu-like symptoms; loss of appetite, right upper belly pain; unusually weak or tired; yellowing of the eyes or skin) low red blood cell counts (trouble breathing; feeling faint; lightheaded, falls; unusually weak or tired) pain, tingling, numbness in the hands or feet tears in the stomach or intestines (fever; stomach pain; sudden change in bowel habits) trouble breathing unusual bruising or bleeding Side effects that usually do not require medical attention (report to your doctor or health care professional if they continue or are bothersome): dizziness headache pain, redness, or irritation at site  where injected This list may not describe all possible side effects. Call your doctor for medical advice about side effects. You may report side effects to FDA at 1-800-FDA-1088. Where should I keep my medication? Keep out of the reach of children and pets. If you are using this medicine at home, you will be instructed on how to store this medicine. Throw away any unused medicine after the expiration date on the label. To get rid of medicines that are no longer needed or have expired: Take the medicine to a medicine take-back program. Check with your pharmacy or law enforcement to find a location. If you cannot return the medicine, ask your pharmacist or health care provider how to get rid of this medicine safely. NOTE: This sheet is a summary. It may not cover all possible information. If you have questions about this medicine, talk to your doctor, pharmacist, or health care provider.  2023 Elsevier/Gold Standard (2021-04-08 00:00:00)

## 2021-09-10 NOTE — Telephone Encounter (Addendum)
Please start Actemra SQ BIV. Pending office visit note to be signed for additional PMH details  Dose: '162mg'$  SQ every 14 days  Dx: Rheumatoid arthritis (M05.9)  Previously tried therapies: Orencia IV -waning clinical response MTX SQ - waning clinical response Daily low-dose prednisone  Knox Saliva, PharmD, MPH, BCPS, CPP Clinical Pharmacist (Rheumatology and Pulmonology)    ----- Message from Shona Needles, RT sent at 09/10/2021 11:02 AM EDT ----- Regarding: NEW START ACTEMRA New start Actemra

## 2021-09-14 ENCOUNTER — Other Ambulatory Visit (HOSPITAL_COMMUNITY): Payer: Self-pay

## 2021-09-14 NOTE — Telephone Encounter (Signed)
Received notification from [facility] regarding a prior authorization for [place]. Authorization has been APPROVED from 09/14/21 to 03/17/22.   Per test claim, copay for 28 days supply is $121.87  Patient can fill through [place]: [phone]   Authorization # Key: [id number] - PA Case ID: [id number]

## 2021-09-14 NOTE — Telephone Encounter (Signed)
Submitted a Prior Authorization request to [facility] for ACTEMRA via CoverMyMeds. Will update once we receive a response.   Key: [id number] - PA Case ID: [id number]

## 2021-09-15 ENCOUNTER — Ambulatory Visit (HOSPITAL_BASED_OUTPATIENT_CLINIC_OR_DEPARTMENT_OTHER): Payer: Medicare Other | Admitting: Physical Therapy

## 2021-09-15 ENCOUNTER — Other Ambulatory Visit: Payer: Self-pay | Admitting: *Deleted

## 2021-09-15 LAB — CBC WITH DIFFERENTIAL/PLATELET
Absolute Monocytes: 871 cells/uL (ref 200–950)
Basophils Absolute: 59 cells/uL (ref 0–200)
Basophils Relative: 0.6 %
Eosinophils Absolute: 99 cells/uL (ref 15–500)
Eosinophils Relative: 1 %
HCT: 39.8 % (ref 35.0–45.0)
Hemoglobin: 13.5 g/dL (ref 11.7–15.5)
Lymphs Abs: 1089 cells/uL (ref 850–3900)
MCH: 34.4 pg — ABNORMAL HIGH (ref 27.0–33.0)
MCHC: 33.9 g/dL (ref 32.0–36.0)
MCV: 101.5 fL — ABNORMAL HIGH (ref 80.0–100.0)
MPV: 11.3 fL (ref 7.5–12.5)
Monocytes Relative: 8.8 %
Neutro Abs: 7781 cells/uL (ref 1500–7800)
Neutrophils Relative %: 78.6 %
Platelets: 318 10*3/uL (ref 140–400)
RBC: 3.92 10*6/uL (ref 3.80–5.10)
RDW: 13 % (ref 11.0–15.0)
Total Lymphocyte: 11 %
WBC: 9.9 10*3/uL (ref 3.8–10.8)

## 2021-09-15 LAB — COMPLETE METABOLIC PANEL WITH GFR
AG Ratio: 2.4 (calc) (ref 1.0–2.5)
ALT: 18 U/L (ref 6–29)
AST: 16 U/L (ref 10–35)
Albumin: 4.7 g/dL (ref 3.6–5.1)
Alkaline phosphatase (APISO): 46 U/L (ref 37–153)
BUN/Creatinine Ratio: 23 (calc) — ABNORMAL HIGH (ref 6–22)
BUN: 26 mg/dL — ABNORMAL HIGH (ref 7–25)
CO2: 26 mmol/L (ref 20–32)
Calcium: 9.8 mg/dL (ref 8.6–10.4)
Chloride: 102 mmol/L (ref 98–110)
Creat: 1.14 mg/dL — ABNORMAL HIGH (ref 0.60–1.00)
Globulin: 2 g/dL (calc) (ref 1.9–3.7)
Glucose, Bld: 86 mg/dL (ref 65–99)
Potassium: 3.8 mmol/L (ref 3.5–5.3)
Sodium: 142 mmol/L (ref 135–146)
Total Bilirubin: 0.4 mg/dL (ref 0.2–1.2)
Total Protein: 6.7 g/dL (ref 6.1–8.1)
eGFR: 51 mL/min/{1.73_m2} — ABNORMAL LOW (ref >=60)

## 2021-09-15 LAB — SEDIMENTATION RATE: Sed Rate: 9 mm/h (ref 0–30)

## 2021-09-15 LAB — RHEUMATOID FACTOR: Rheumatoid fact SerPl-aCnc: <14 IU/mL (ref <14)

## 2021-09-15 LAB — CYCLIC CITRUL PEPTIDE ANTIBODY, IGG: Cyclic Citrullin Peptide Ab: <16 UNITS

## 2021-09-15 LAB — HEPATITIS B SURFACE ANTIGEN: Hepatitis B Surface Ag: NONREACTIVE

## 2021-09-15 LAB — QUANTIFERON-TB GOLD PLUS
Mitogen-NIL: 4.03 IU/mL
NIL: 0.04 IU/mL
QuantiFERON-TB Gold Plus: NEGATIVE
TB1-NIL: 0 IU/mL
TB2-NIL: 0.02 IU/mL

## 2021-09-15 LAB — C-REACTIVE PROTEIN: CRP: 0.6 mg/L (ref <8.0)

## 2021-09-15 LAB — HEPATITIS B CORE ANTIBODY, IGM: Hep B C IgM: NONREACTIVE

## 2021-09-15 LAB — HEPATITIS C ANTIBODY
Hepatitis C Ab: NONREACTIVE
SIGNAL TO CUT-OFF: 0.07 (ref <1.00)

## 2021-09-15 MED ORDER — "BD TB SYRINGE 27G X 1/2"" 1 ML MISC": 12.0000 | 3 refills | Status: DC

## 2021-09-15 MED ORDER — METHOTREXATE SODIUM CHEMO INJECTION 50 MG/2ML: 15.0000 mg | INTRAMUSCULAR | 0 refills | Status: DC

## 2021-09-15 MED ORDER — PREDNISONE 5 MG PO TABS: 5.0000 mg | ORAL_TABLET | Freq: Every day | ORAL | 2 refills | Status: DC

## 2021-09-15 MED ORDER — LEUCOVORIN CALCIUM 5 MG PO TABS
5.0000 mg | ORAL_TABLET | Freq: Every day | ORAL | 2 refills | Status: DC
Start: 2021-09-15 — End: 2021-12-15

## 2021-09-15 NOTE — Telephone Encounter (Signed)
Called patient to review labs results. Patient states she has several prescriptions that Dr. Amil Amen used to fill for her and she is in need of refills. Patient states she needs refills on Prednisone, Methotrexate, and Leucovorin.

## 2021-09-15 NOTE — Progress Notes (Signed)
Lab results look fine for starting actemra as planned.

## 2021-09-15 NOTE — Telephone Encounter (Signed)
-----   Message from Collier Salina, MD sent at 09/15/2021 12:59 PM EDT ----- Lab results look fine for starting actemra as planned.

## 2021-09-15 NOTE — Telephone Encounter (Signed)
Spoke with patient regarding Actemra new start. She states she is comfortable with paying this copay. I reviewed [employer] as an option for patient assistance, and based on the information she provided, she should qualify for the program. We will complete [employer] patient assistance application at new start visit.  Knox Saliva, PharmD, MPH, BCPS, CPP Clinical Pharmacist (Rheumatology and Pulmonology)

## 2021-09-20 ENCOUNTER — Other Ambulatory Visit (HOSPITAL_COMMUNITY): Payer: Self-pay

## 2021-09-20 ENCOUNTER — Ambulatory Visit (INDEPENDENT_AMBULATORY_CARE_PROVIDER_SITE_OTHER): Payer: Medicare Other | Admitting: Pharmacist

## 2021-09-20 VITALS — BP 167/96 | HR 52

## 2021-09-20 DIAGNOSIS — M459 Ankylosing spondylitis of unspecified sites in spine: Secondary | ICD-10-CM

## 2021-09-20 DIAGNOSIS — M353 Polymyalgia rheumatica: Secondary | ICD-10-CM

## 2021-09-20 DIAGNOSIS — Z79899 Other long term (current) drug therapy: Secondary | ICD-10-CM

## 2021-09-20 MED ORDER — ACTEMRA 162 MG/0.9ML ~~LOC~~ SOSY
162.0000 mg | PREFILLED_SYRINGE | SUBCUTANEOUS | 2 refills | Status: DC
  Filled 2021-09-20 (×2): qty 1.8, 28d supply, fill #0

## 2021-09-20 NOTE — Telephone Encounter (Signed)
Reached out to pt and verified address and availability to receive shipment of medication. Pt was driving and could not provide CC info at this time, requested I f/u at 4pm.

## 2021-09-20 NOTE — Telephone Encounter (Addendum)
Submitted Patient Assistance Application to [place] for [place] along with provider portion, patient portion, med list, insurance card copy, PA. Will update patient when we receive a response.  Fax# [phone] Phone# [phone]  Of note, patient started Actemra today and will fill with [facility] for first month until we receive update from [facility], PharmD, MPH, BCPS, CPP Clinical Pharmacist (Rheumatology and Pulmonology)

## 2021-09-20 NOTE — Patient Instructions (Signed)
Your next ACTEMRA dose is due on 10/04/21, 10/18/21, and every 14 days thereafter  CONTINUE  methotrexate '15mg'$  SQ weekly, leucovorin '1mg'$  daily, and prednisone '5mg'$  daily  HOLD ACTEMRA, methotrexate, and leucovorin if you have signs or symptoms of an infection. You can resume once you feel better or back to your baseline. HOLD ACTEMRA, methotrexate, and leucovorin  if you start antibiotics to treat an infection. HOLD ACTEMRA, methotrexate, and leucovorin  around the time of surgery/procedures. Your surgeon will be able to provide recommendations on when to hold BEFORE and when you are cleared to [place].  Pharmacy information: [facility] will call you from [phone] to schedule the first shipment and collect payment information. After this, your prescription will be shipped from [place]. Their phone number is [phone] Please call to schedule shipment and confirm address. They will mail your medication to your home.  Labs are due in 1 month then every 3 months. Lab hours are from Monday to Thursday 1:30-4:30pm and Friday 1:30-4pm. You do not need an appointment if you come for labs during these times.  How to manage an injection site reaction: Remember the 5 C's: COUNTER - leave on the counter at least 30 minutes but up to overnight to bring medication to room temperature. This may help prevent stinging COLD - place something cold (like an ice gel pack or cold water bottle) on the injection site just before cleansing with alcohol. This may help reduce pain CLARITIN - use Claritin (generic name is loratadine) for the first two weeks of treatment or the day of, the day before, and the day after injecting. This will help to minimize injection site reactions CORTISONE CREAM - apply if injection site is irritated and itching CALL ME - if injection site reaction is bigger than the size of your fist, looks infected, blisters, or if you develop hives

## 2021-09-20 NOTE — Telephone Encounter (Signed)
Delivery instructions have been updated in [place], medication will be shipped to patient's home address by 09/22/2021.  Rx has been processed in [facility] and there is a copay of $121.87. Payment information has been collected and forwarded to the pharmacy.

## 2021-09-20 NOTE — Progress Notes (Signed)
Pharmacy Note  Subjective:   Patient presents to clinic today to receive first dose of Actemra for RA. She currently takes methotrexate '15mg'$  SQ weekly, leucovorin '1mg'$  daily, and prednisone '5mg'$  daily.  Patient running a fever or have signs/symptoms of infection? No  Patient currently on antibiotics for the treatment of infection? No  Patient have any upcoming invasive procedures/surgeries? No  Objective: CMP     Component Value Date/Time   NA 142 09/10/2021 1108   NA 139 04/05/2021 0000   K 3.8 09/10/2021 1108   CL 102 09/10/2021 1108   CO2 26 09/10/2021 1108   GLUCOSE 86 09/10/2021 1108   BUN 26 (H) 09/10/2021 1108   BUN 13 04/05/2021 0000   CREATININE 1.14 (H) 09/10/2021 1108   CALCIUM 9.8 09/10/2021 1108   PROT 6.7 09/10/2021 1108   ALBUMIN 4.5 04/05/2021 0000   AST 16 09/10/2021 1108   ALT 18 09/10/2021 1108   ALKPHOS 70 07/29/2020 1100   BILITOT 0.4 09/10/2021 1108   GFRNONAA 63 04/05/2021 0000   GFRNONAA >60 05/27/2020 1333   GFRNONAA 57 (L) 04/13/2017 1201   GFRAA >60 11/27/2019 0244   GFRAA 66 04/13/2017 1201    CBC    Component Value Date/Time   WBC 9.9 09/10/2021 1108   RBC 3.92 09/10/2021 1108   HGB 13.5 09/10/2021 1108   HCT 39.8 09/10/2021 1108   PLT 318 09/10/2021 1108   MCV 101.5 (H) 09/10/2021 1108   MCH 34.4 (H) 09/10/2021 1108   MCHC 33.9 09/10/2021 1108   RDW 13.0 09/10/2021 1108   LYMPHSABS 1,089 09/10/2021 1108   MONOABS 0.8 07/29/2020 1100   EOSABS 99 09/10/2021 1108   BASOSABS 59 09/10/2021 1108    Baseline Immunosuppressant Therapy Labs TB GOLD    Latest Ref Rng & Units 09/10/2021   11:08 AM  Quantiferon TB Gold  Quantiferon TB Gold Plus NEGATIVE NEGATIVE     Hepatitis Panel    Latest Ref Rng & Units 09/10/2021   11:08 AM  Hepatitis  Hep B Surface Ag NON-REACTIVE NON-REACTIVE    Hep B IgM NON-REACTIVE NON-REACTIVE    Hep C Ab NON-REACTIVE NON-REACTIVE     HIV No results found for: HIV Immunoglobulins   SPEP    Latest  Ref Rng & Units 09/10/2021   11:08 AM  Serum Protein Electrophoresis  Total Protein 6.1 - 8.1 g/dL 6.7     G6PD No results found for: G6PDH TPMT No results found for: TPMT   Chest x-ray: 01/08/19 - No edema or consolidation.  No evident adenopathy.  Assessment/Plan:  Demonstrated proper injection technique with Actemra demo device  Patient able to demonstrate proper injection technique using the teach back method.  Patient self injected in the left abdomen with:  Sample Medication: Actemra '162mg'$ /0.82m autoinjector pen NDC: [phone]: [id number]: 11/2022  Patient tolerated well.  Observed for 30 mins in office for adverse reaction and none noted.   Patient is to return in 1 month for labs and 6-8 weeks for follow-up appointment.  Standing orders placed.  Orders placed for CBC, CMP, and lipid panel  Actemra approved through insurance .   [facility] [phone].  Patient provided with pharmacy phone number and advised [facility]. We will submit [employer] assistance application today  She will continue Actemra '162mg'$  SQ every 14 days in combination with methotrexate '15mg'$  SQ weekly, leucovorin '1mg'$  daily, and prednisone '5mg'$  daily  All questions encouraged and answered.  Instructed patient to call with any further questions or concerns.  Knox Saliva, PharmD, MPH, BCPS Clinical Pharmacist (Rheumatology and Pulmonology)  09/20/2021 10:10 AM

## 2021-09-21 ENCOUNTER — Other Ambulatory Visit (HOSPITAL_COMMUNITY): Payer: Self-pay

## 2021-09-21 ENCOUNTER — Other Ambulatory Visit: Payer: Self-pay | Admitting: Physician Assistant

## 2021-09-21 ENCOUNTER — Ambulatory Visit (HOSPITAL_BASED_OUTPATIENT_CLINIC_OR_DEPARTMENT_OTHER): Payer: Medicare Other | Admitting: Physical Therapy

## 2021-09-22 ENCOUNTER — Other Ambulatory Visit (HOSPITAL_COMMUNITY): Payer: Self-pay

## 2021-09-22 NOTE — Telephone Encounter (Signed)
Received a fax from  [place] regarding an approval for [place] patient assistance from 09/21/21 until therapy is discontinued or patient no longer meets financial criteria.   [place] phone number: [phone] [facility] Phone number: [phone]  Knox Saliva, PharmD, MPH, BCPS, CPP Clinical Pharmacist (Rheumatology and Pulmonology)

## 2021-09-23 MED ORDER — ACTEMRA 162 MG/0.9ML ~~LOC~~ SOSY: 162.0000 mg | PREFILLED_SYRINGE | SUBCUTANEOUS | 0 refills | Status: DC

## 2021-09-23 NOTE — Telephone Encounter (Signed)
Called patient to advise on Actemra patient assistance approval. Provided her with phone number for [place] and advised that she will no longer be filling with [facility].  Knox Saliva, PharmD, MPH, BCPS, CPP Clinical Pharmacist (Rheumatology and Pulmonology)

## 2021-09-23 NOTE — Progress Notes (Signed)
Patient approved for Actemra patient assistance. She will receive Actemra through [facility] (and not [facility])  Knox Saliva, PharmD, MPH, BCPS, CPP Clinical Pharmacist (Rheumatology and Pulmonology)

## 2021-10-05 ENCOUNTER — Ambulatory Visit (INDEPENDENT_AMBULATORY_CARE_PROVIDER_SITE_OTHER): Payer: Medicare Other | Admitting: Ophthalmology

## 2021-10-05 ENCOUNTER — Encounter (INDEPENDENT_AMBULATORY_CARE_PROVIDER_SITE_OTHER): Payer: Self-pay | Admitting: Ophthalmology

## 2021-10-05 DIAGNOSIS — H43811 Vitreous degeneration, right eye: Secondary | ICD-10-CM

## 2021-10-05 DIAGNOSIS — H43812 Vitreous degeneration, left eye: Secondary | ICD-10-CM

## 2021-10-05 DIAGNOSIS — H33311 Horseshoe tear of retina without detachment, right eye: Secondary | ICD-10-CM

## 2021-10-05 NOTE — Assessment & Plan Note (Signed)
OD well treated break inferotemporally, operculated no new breaks

## 2021-10-05 NOTE — Assessment & Plan Note (Signed)
No new breaks °

## 2021-10-05 NOTE — Assessment & Plan Note (Signed)
Physiologic OS no breaks

## 2021-10-05 NOTE — Progress Notes (Signed)
10/05/2021     CHIEF COMPLAINT Patient presents for  Chief Complaint  Patient presents with   Retina Follow Up      HISTORY OF PRESENT ILLNESS: Vanessa Harmon is a 71 y.o. female who presents to the clinic today for:   HPI     Retina Follow Up           Diagnosis: Horseshoe Retinal Tear   Laterality: right eye   Onset: 1 year ago         Comments   1 yr fu OU FP. Patient states vision is stable and unchanged since last visit. Denies any new FOL. Patient states she has new floaters occasionally in the right eye. Patient reports she has a big floater in her left eye. Patient uses Xiidra BID OU.       Last edited by Laurin Coder on 10/05/2021  1:35 PM.      Referring physician: Clent Jacks, MD [address] [place],  [place] [address]  HISTORICAL INFORMATION:   Selected notes from the MEDICAL RECORD NUMBER    Lab Results  Component Value Date   HGBA1C 5.7 (A) 07/27/2021     CURRENT MEDICATIONS: Current Outpatient Medications (Ophthalmic Drugs)  Medication Sig   carboxymethylcellulose (REFRESH PLUS) 0.5 % SOLN Place 1 drop into both eyes 3 (three) times daily as needed (dry eyes).    Lifitegrast (XIIDRA) 5 % SOLN Place 1 drop into both eyes in the morning and at bedtime.    No current facility-administered medications for this visit. (Ophthalmic Drugs)   Current Outpatient Medications (Other)  Medication Sig   albuterol (PROAIR HFA) 108 (90 Base) MCG/ACT inhaler Inhale two puffs every 4-6 hours if needed for cough or wheeze. (Patient taking differently: Inhale 2 puffs into the lungs every 4 (four) hours as needed for wheezing or shortness of breath. Inhale two puffs every 4-6 hours if needed for cough or wheeze.)   amLODipine (NORVASC) 5 MG tablet    aspirin EC 81 MG tablet Take 1 tablet (81 mg total) by mouth daily. Swallow whole.   azelastine (ASTELIN) 0.1 % nasal spray Place 1 spray into both nostrils 2 (two) times daily. Use in each nostril  as directed   Bempedoic Acid (NEXLETOL) 180 MG TABS Take 1 Dose by mouth daily.   budesonide (PULMICORT) 0.5 MG/2ML nebulizer solution Inhale into the lungs.   budesonide (PULMICORT) 1 MG/2ML nebulizer solution    Calcium Carb-Cholecalciferol (CALCIUM 600/VITAMIN D3 PO) Take 1 tablet by mouth daily.   Cetirizine HCl (ZYRTEC ALLERGY) 10 MG CAPS Zyrtec 10 mg capsule   Cholecalciferol 125 MCG (5000 UT) TABS Vitamin D3 125 mcg (5,000 unit) tablet  Take 1 tablet every day by oral route.   denosumab (PROLIA) 60 MG/ML SOSY injection Inject 60 mg into the skin every 6 (six) months.   diphenhydrAMINE (BENADRYL) 25 MG tablet Take 50 mg by mouth daily as needed for allergies.   docusate sodium (COLACE) 100 MG capsule    escitalopram (LEXAPRO) 20 MG tablet Take 1 tablet (20 mg total) by mouth daily.   Evolocumab (REPATHA SURECLICK) 809 MG/ML SOAJ Inject 1 Dose into the skin every 14 (fourteen) days.   ezetimibe (ZETIA) 10 MG tablet TAKE ONE TABLET BY MOUTH DAILY   famotidine (PEPCID) 40 MG tablet TAKE 1 TABLET (40 MG TOTAL) BY MOUTH AT BEDTIME.   fluticasone (FLOVENT HFA) 220 MCG/ACT inhaler Inhale 2 puffs into the lungs 2 (two) times daily.  furosemide (LASIX) 40 MG tablet Take by mouth.   gabapentin (NEURONTIN) 300 MG capsule Take 300 mg by mouth at bedtime.   guaiFENesin (MUCINEX) 600 MG 12 hr tablet Take 600 mg by mouth 2 (two) times daily as needed for cough or to loosen phlegm.   HYDROcodone-acetaminophen (NORCO) 10-325 MG tablet 1 tablet as needed   indomethacin (INDOCIN SR) 75 MG CR capsule TAKE ONE CAPSULE BY MOUTH DAILY WITH BREAKFAST   ipratropium (ATROVENT) 0.03 % nasal spray Place 1 spray into both nostrils 2 (two) times daily as needed for rhinitis.   irbesartan (AVAPRO) 300 MG tablet TAKE ONE TABLET BY MOUTH DAILY   leucovorin (WELLCOVORIN) 5 MG tablet Take 1 tablet (5 mg total) by mouth daily.   methocarbamol (ROBAXIN) 500 MG tablet Take 1 tablet (500 mg total) by mouth every 6 (six)  hours as needed for muscle spasms.   methotrexate 50 MG/2ML injection Inject 0.6 mLs (15 mg total) into the skin once a week.   montelukast (SINGULAIR) 10 MG tablet Take 1 tablet (10 mg total) by mouth at bedtime.   Multiple Vitamin (MULTIVITAMIN) capsule Take 1 capsule by mouth daily.   Needles & Syringes (1CC TB SYRINGE) MISC 27g 1/2"   Omega-3 Fatty Acids (FISH OIL PO) Take 1,000 mg by mouth daily.   omeprazole (PRILOSEC) 40 MG capsule TAKE ONE CAPSULE BY MOUTH DAILY BEFORE BREAKFAST DAILY (Patient taking differently: Take 40 mg by mouth daily. TAKE ONE CAPSULE BY MOUTH DAILY BEFORE BREAKFAST DAILY)   OVER THE COUNTER MEDICATION Place 1 application into both eyes at bedtime. [place]ente[place] overnight eye ointment   polyethylene glycol powder (GLYCOLAX/MIRALAX) 17 GM/SCOOP powder Miralax 17 gram/dose oral powder   predniSONE (DELTASONE) 5 MG tablet Take 1 tablet (5 mg total) by mouth daily with breakfast.   PRESCRIPTION MEDICATION Place 1 spray into the nose in the morning and at bedtime. Budesonide 0.5 mg / 2 mL Nasal rinse   pseudoephedrine (SUDAFED) 30 MG tablet Take 30 mg by mouth every 4 (four) hours as needed for congestion.   Tocilizumab (ACTEMRA) 162 MG/0.9ML SOSY Inject 0.9 mLs (162 mg total) into the skin every 14 (fourteen) days.   traZODone (DESYREL) 150 MG tablet TAKE ONE TABLET BY MOUTH DAILY   TUBERCULIN SYR 1CC/27GX1/2" (B-D TB SYRINGE 1CC/27GX1/2") 27G X 1/2" 1 ML MISC 12 Syringes by Does not apply route once a week.   valACYclovir (VALTREX) 1000 MG tablet Take 1,000 mg by mouth daily.    vitamin C (ASCORBIC ACID) 500 MG tablet Take 500 mg by mouth daily.   No current facility-administered medications for this visit. (Other)      REVIEW OF SYSTEMS: ROS   Negative for: Constitutional, Gastrointestinal, Neurological, Skin, Genitourinary, Musculoskeletal, HENT, Endocrine, Cardiovascular, Eyes, Respiratory, Psychiatric, Allergic/Imm, Heme/Lymph Last edited by Hurman Horn, MD on  10/05/2021  2:04 PM.       ALLERGIES Allergies  Allergen Reactions   Advair Hfa [Fluticasone-Salmeterol] Other (See Comments)    Hoarness   Statins Other (See Comments)   Breo Ellipta [Fluticasone Furoate-Vilanterol] Other (See Comments)    Severe hoarseness    Fosamax [Alendronate Sodium] Other (See Comments)    myalgias   Levaquin [Levofloxacin] Other (See Comments)    Lightheadedness, not feeling well, ear pain   Doxycycline Hyclate    Infliximab Other (See Comments)    Skin reaction   Spiriva Handihaler [Tiotropium Bromide Monohydrate]     UNSPECIFIED REACTION    Verapamil    Tape Rash  PAPER TAPE: Causes severe rash and weeping    PAST MEDICAL HISTORY Past Medical History:  Diagnosis Date   Adenomatous polyp    Allergy    Anxiety    takes Ativan daily as needed   Arthritis    inflammatory arthritis (Dr. Dagoberto Ligas)   Asthma    Cataract    Chronic back pain    scoliosis    Constipation    takes miralax every other day   Depression    takes Lexapro daily   Eczema    Essential hypertension, benign    takes Hyzaar and Metoprolol daily   Family history of adverse reaction to anesthesia    Sister PONV   Fibromyalgia    GERD (gastroesophageal reflux disease)    History of colon polyps    History of shingles    Hyperlipidemia    takes Atorvastatin daily   Insomnia    takes Melatonin nightly   Joint pain    Osteoarthritis    Osteoporosis    PSVT (paroxysmal supraventricular tachycardia) (HCC)    Recurrent upper respiratory infection (URI)    Rheumatoid arthritis ([place])    sees Dr. Amil Amen; stable on Orencia   Rhinitis, allergic    uses Flonase daily   Urinary urgency    Urticaria    Past Surgical History:  Procedure Laterality Date   ADENOIDECTOMY     APPENDECTOMY     BACK SURGERY  2012   at age 89 d/t scoliosis   BLEPHAROPLASTY  2018   BREAST BIOPSY     cataract surgery     COLONOSCOPY     HARDWARE REMOVAL Left 02/24/2020   Procedure:  HARDWARE REMOVAL LEFT DISTAL RADIUS;  Surgeon: Leanora Cover, MD;  Location: [facility];  Service: Orthopedics;  Laterality: Left;   KNEE ARTHROSCOPY Bilateral    neuroma removed from foot     unsure of which foot   OPEN REDUCTION INTERNAL FIXATION (ORIF) DISTAL RADIAL FRACTURE Left 08/15/2019   Procedure: OPEN REDUCTION INTERNAL FIXATION (ORIF) DISTAL RADIAL FRACTURE;  Surgeon: Leanora Cover, MD;  Location: [facility];  Service: Orthopedics;  Laterality: Left;  block in preop   POSTERIOR CERVICAL FUSION/FORAMINOTOMY N/A 05/29/2020   Procedure: Removal of hardware, Replacement of thoracic 8, thoracic 9, thoracic 10 screws, Right Thoracic 6-7, Right Thoracic 7-8 Laminectomies with thoracic hooks from Thoracic 4-5 to Thoracic 10;  Surgeon: Erline Levine, MD;  Location: [facility];  Service: Neurosurgery;  Laterality: N/A;   POSTERIOR LUMBAR FUSION 4 LEVEL N/A 02/15/2018   Procedure: Decompression and fusion Thoracic nine to Lumbar two with exploration of previous fusion;  Surgeon: Erline Levine, MD;  Location: [facility];  Service: Neurosurgery;  Laterality: N/A;   SHOULDER SURGERY Right    TONSILLECTOMY     TOTAL HIP ARTHROPLASTY Right 04/15/2014   Procedure: RIGHT TOTAL HIP ARTHROPLASTY ANTERIOR APPROACH;  Surgeon: Renette Butters, MD;  Location: [facility];  Service: Orthopedics;  Laterality: Right;   TOTAL KNEE ARTHROPLASTY Left 11/26/2019   Procedure: TOTAL KNEE ARTHROPLASTY;  Surgeon: Paralee Cancel, MD;  Location: [facility];  Service: Orthopedics;  Laterality: Left;  70 mins    FAMILY HISTORY Family History  Problem Relation Age of Onset   Stroke Mother    Allergic rhinitis Mother    Allergic rhinitis Father    AAA (abdominal aortic aneurysm) Father    COPD Father    Prostate cancer Father    Hypertension Other  unspecified grandmother   AAA (abdominal aortic aneurysm) Other        unspecified grandfather   Cancer Other        unspecified grandfather   Neuropathy  Neg Hx     SOCIAL HISTORY Social History   Tobacco Use   Smoking status: Former    Packs/day: 1.00    Years: 20.00    Total pack years: 20.00    Types: Cigarettes    Quit date: 1998    Years since quitting: 25.4   Smokeless tobacco: Never   Tobacco comments:    hasn't smoked in 80yr  Vaping Use   Vaping Use: Never used  Substance Use Topics   Alcohol use: Not Currently    Comment: rarely with dinner   Drug use: No         OPHTHALMIC EXAM:  Base Eye Exam     Visual Acuity (ETDRS)       Right Left   Dist cc 20/30 +2 20/25 -1   Dist ph cc NI     Correction: Glasses         Tonometry (Tonopen, 1:38 PM)       Right Left   Pressure 4 5         Pupils       Pupils Dark Light React APD   Right PERRL 3 3 Minimal None   Left PERRL 4 4 Minimal None         Extraocular Movement       Right Left    Full Full         Neuro/Psych     Oriented x3: Yes   Mood/Affect: Normal         Dilation     Both eyes: 1.0% Mydriacyl, 2.5% Phenylephrine @ 1:38 PM           Slit Lamp and Fundus Exam     External Exam       Right Left   External Normal Normal         Slit Lamp Exam       Right Left   Lids/Lashes Normal Normal   Conjunctiva/Sclera White and quiet White and quiet   Cornea Clear Clear   Anterior Chamber Deep and quiet Deep and quiet   Iris Round and reactive Round and reactive   Lens Centered posterior chamber intraocular lens Centered posterior chamber intraocular lens   Anterior Vitreous Clear Normal         Fundus Exam       Right Left   Posterior Vitreous Posterior vitreous detachment Posterior vitreous detachment   Disc Normal Normal   C/D Ratio 0.6 0.75   Macula Normal Normal   Vessels Normal Normal   Periphery No new holes or tears good retinopexy inferotemporal, 25 D exam with operculated tear well treated No new holes or tears good retinopexy inferotemporal 20-20 5D exam            IMAGING AND  PROCEDURES  Imaging and Procedures for 10/05/21  OCT, Retina - OU - Both Eyes       Right Eye Central Foveal Thickness: 254. Progression has been stable. Findings include retinal drusen .   Left Eye Central Foveal Thickness: 249. Progression has been stable. Findings include retinal drusen .      Color Fundus Photography Optos - OU - Both Eyes       Right Eye Progression has been stable. Disc findings include increased cup to disc  ratio. Macula : normal observations. Vessels : normal observations. Periphery : tear, RPE abnormality.   Left Eye Progression has been stable. Disc findings include increased cup to disc ratio. Macula : normal observations. Vessels : normal observations. Periphery : normal observations.   Notes Good retinopexy inferotemporal OD, no new breaks  Floater inferiorly OS no retinal breaks             ASSESSMENT/PLAN:  Posterior vitreous detachment of left eye Physiologic OS no breaks  Posterior vitreous detachment of right eye No new breaks  Horseshoe retinal tear of right eye OD well treated break inferotemporally, operculated no new breaks     ICD-10-CM   1. Horseshoe retinal tear of right eye  H33.311 OCT, Retina - OU - Both Eyes    Color Fundus Photography Optos - OU - Both Eyes    2. Posterior vitreous detachment of left eye  H43.812     3. Posterior vitreous detachment of right eye  H43.811       1.  Signs and symptoms of retinal detachment reviewed.  2.  3.  Ophthalmic Meds Ordered this visit:  No orders of the defined types were placed in this encounter.      Return in about 1 year (around 10/06/2022) for DILATE OU, COLOR FP, OCT.  There are no Patient Instructions on file for this visit.   Explained the diagnoses, plan, and follow up with the patient and they expressed understanding.  Patient expressed understanding of the importance of proper follow up care.   Clent Demark Maxwell Martorano M.D. Diseases & Surgery of the Retina  and Vitreous Retina & Diabetic [place] 10/05/21     Abbreviations: M myopia (nearsighted); A astigmatism; H hyperopia (farsighted); P presbyopia; Mrx spectacle prescription;  CTL contact lenses; OD right eye; OS left eye; OU both eyes  XT exotropia; ET esotropia; PEK punctate epithelial keratitis; PEE punctate epithelial erosions; DES dry eye syndrome; MGD meibomian gland dysfunction; ATs artificial tears; PFAT's preservative free artificial tears; [place] nuclear sclerotic cataract; PSC posterior subcapsular cataract; ERM epi-retinal membrane; PVD posterior vitreous detachment; RD retinal detachment; DM diabetes mellitus; DR diabetic retinopathy; NPDR non-proliferative diabetic retinopathy; PDR proliferative diabetic retinopathy; CSME clinically significant macular edema; DME diabetic macular edema; dbh dot blot hemorrhages; CWS cotton wool spot; POAG primary open angle glaucoma; C/D cup-to-disc ratio; HVF humphrey visual field; GVF goldmann visual field; OCT optical coherence tomography; IOP intraocular pressure; BRVO Branch retinal vein occlusion; CRVO central retinal vein occlusion; CRAO central retinal artery occlusion; BRAO branch retinal artery occlusion; RT retinal tear; SB scleral buckle; PPV pars plana vitrectomy; VH Vitreous hemorrhage; PRP panretinal laser photocoagulation; IVK intravitreal kenalog; VMT vitreomacular traction; MH Macular hole;  NVD neovascularization of the disc; NVE neovascularization elsewhere; AREDS age related eye disease study; ARMD age related macular degeneration; POAG primary open angle glaucoma; EBMD epithelial/anterior basement membrane dystrophy; ACIOL anterior chamber intraocular lens; IOL intraocular lens; PCIOL posterior chamber intraocular lens; Phaco/IOL phacoemulsification with intraocular lens placement; [place] photorefractive keratectomy; LASIK laser assisted in situ keratomileusis; HTN hypertension; DM diabetes mellitus; COPD chronic obstructive pulmonary  disease

## 2021-10-08 ENCOUNTER — Encounter: Payer: Self-pay | Admitting: Internal Medicine

## 2021-10-08 ENCOUNTER — Other Ambulatory Visit (HOSPITAL_COMMUNITY): Payer: Self-pay

## 2021-10-08 ENCOUNTER — Telehealth: Payer: Self-pay | Admitting: Pharmacist

## 2021-10-08 DIAGNOSIS — M810 Age-related osteoporosis without current pathological fracture: Secondary | ICD-10-CM

## 2021-10-12 ENCOUNTER — Other Ambulatory Visit: Payer: Self-pay | Admitting: Physician Assistant

## 2021-10-12 LAB — NMR, LIPOPROFILE
Cholesterol, Total: 137 mg/dL (ref 100–199)
HDL Particle Number: 49.6 umol/L (ref >=30.5)
HDL-C: 55 mg/dL (ref >39)
LDL Particle Number: 377 nmol/L (ref <1000)
LDL-C (NIH Calc): 48 mg/dL (ref 0–99)
LP-IR Score: 67 — ABNORMAL HIGH (ref <=45)
Small LDL Particle Number: 136 nmol/L (ref <=527)
Triglycerides: 213 mg/dL — ABNORMAL HIGH (ref 0–149)

## 2021-10-15 ENCOUNTER — Other Ambulatory Visit (HOSPITAL_COMMUNITY): Payer: Self-pay

## 2021-10-15 ENCOUNTER — Encounter (HOSPITAL_BASED_OUTPATIENT_CLINIC_OR_DEPARTMENT_OTHER): Payer: Self-pay | Admitting: Internal Medicine

## 2021-10-15 ENCOUNTER — Ambulatory Visit (INDEPENDENT_AMBULATORY_CARE_PROVIDER_SITE_OTHER): Payer: Medicare Other | Admitting: Internal Medicine

## 2021-10-15 VITALS — BP 130/64 | HR 58 | Ht 66.0 in | Wt 175.5 lb

## 2021-10-15 DIAGNOSIS — M791 Myalgia, unspecified site: Secondary | ICD-10-CM

## 2021-10-15 DIAGNOSIS — T466X5A Adverse effect of antihyperlipidemic and antiarteriosclerotic drugs, initial encounter: Secondary | ICD-10-CM

## 2021-10-15 DIAGNOSIS — E7849 Other hyperlipidemia: Secondary | ICD-10-CM

## 2021-10-15 DIAGNOSIS — M459 Ankylosing spondylitis of unspecified sites in spine: Secondary | ICD-10-CM

## 2021-10-15 DIAGNOSIS — T466X5D Adverse effect of antihyperlipidemic and antiarteriosclerotic drugs, subsequent encounter: Secondary | ICD-10-CM

## 2021-10-15 MED ORDER — DENOSUMAB 60 MG/ML ~~LOC~~ SOSY
60.0000 mg | PREFILLED_SYRINGE | SUBCUTANEOUS | 0 refills | Status: DC
  Filled 2021-10-15 (×2): qty 1, 180d supply, fill #0

## 2021-10-15 NOTE — Telephone Encounter (Signed)
Spoke with patient regarding Prolia benefits. She states she'd rather not go to hospital. Will plan to repeat labs next week for Actemra monitoring. Will CBC, CMP, and Vitamin D drawn. Orders in place.  Rx for Prolia sent to [facility] to be couriered to clinic. Patient aware that [facility] will reach out for first shipment and to collect copay.  Knox Saliva, PharmD, MPH, BCPS, CPP Clinical Pharmacist (Rheumatology and Pulmonology)

## 2021-10-15 NOTE — Telephone Encounter (Signed)
Delivery instructions have been updated in [facility], medication will be couriered to [facility] by 10/22/21.  Rx has been processed in [facility] and there is a copay of $86.77. Payment information has been collected and forwarded to the pharmacy.

## 2021-10-15 NOTE — Patient Instructions (Signed)
Medication Instructions:  Your physician recommends that you continue on your current medications as directed. Please refer to the Current Medication list given to you today.  *If you need a refill on your cardiac medications before your next appointment, please call your pharmacy*   Lab Work: FASTING lab work to check cholesterol in 1 year -- NMR lipoprofile   If you have labs (blood work) drawn today and your tests are completely normal, you will receive your results only by: [place] (if you have MyChart) OR A paper copy in the mail If you have any lab test that is abnormal or we need to change your treatment, we will call you to review the results.   Testing/Procedures: NONE   Follow-Up: At [facility], you and your health needs are our priority.  As part of our continuing mission to provide you with exceptional heart care, we have created designated Provider Care Teams.  These Care Teams include your primary Cardiologist (physician) and Advanced Practice Providers (APPs -  Physician Assistants and Nurse Practitioners) who all work together to provide you with the care you need, when you need it.  We recommend signing up for the patient portal called "MyChart".  Sign up information is provided on this After Visit Summary.  MyChart is used to connect with patients for Virtual Visits (Telemedicine).  Patients are able to view lab/test results, encounter notes, upcoming appointments, etc.  Non-urgent messages can be sent to your provider as well.   To learn more about what you can do with MyChart, go to [email or web address].    Your next appointment:   12 month(s)  The format for your next appointment:   In Person  Provider:   Dr. Lyman Bishop -- lipid clinic

## 2021-10-15 NOTE — Telephone Encounter (Signed)
Called [facility] [employer] for Prolia medical benefits and eligibility.  Phone: [phone]  Provider is in-network. Patient is responsible for 20% coinsurance for Part B. Max OOP is $3800. As of 10/15/21, patient has met OOP max so will not have any cost for medication at [facility]. Prolia Q0347 does require prior authorization and can be submitted via [facility] provider portal. There is authorization on file 04/18/2021 through 04/17/2022 for diagnosis M85.89. Auth # [id number]  Rep recommends submitting new authorization for Dr. Benjamine Mola since she is transferring providers. Rep recommends notating that Josem Kaufmann is already on file with previous authorization number and that servicing provider is changing.  Call ref # [id number]  Patient is due on 10/28/21.  Knox Saliva, PharmD, MPH, BCPS, CPP Clinical Pharmacist (Rheumatology and Pulmonology)

## 2021-10-15 NOTE — Telephone Encounter (Signed)
Received notification from  [facility] Provider portal  regarding a prior authorization for Vanessa Harmon. Authorization has been APPROVED (under Dr. Benjamine Mola) for 2 doses from 10/15/2021 to 10/16/2022. Approval letter sent to scan center.  Authorization # [id number]

## 2021-10-15 NOTE — Progress Notes (Signed)
LIPID CLINIC CONSULT NOTE  Chief Complaint:  Follow-up dyslipidemia  Primary Care Physician: Inda Coke, PA  Primary Cardiologist:  None  HPI:  Vanessa Harmon is a 71 y.o. female who is being seen today for the evaluation of dyslipidemia at the request of Inda Coke, [place].  This is a pleasant 71 year old female kindly referred for evaluation and management of dyslipidemia.  She has a history of longstanding dyslipidemia previously on atorvastatin however developed significant myalgias and had to discontinue the medicine.  Her LDL cholesterol had been much better controlled around 100 however recent labs in April off of medication showed total cholesterol 305, HDL 52, triglycerides 149 and LDL 223.  These findings are highly suggestive of a genetic dyslipidemia, probably familial hyperlipidemia.  She notes her mom had a stroke at early age (<50) and also had high cholesterol and she also had a brother with high cholesterol (unclear if greater than 190).  Based on the [employer] criteria, this would be considered probable familial hyperlipidemia.  Unfortunately, she also has statin intolerance, in addition was tried on rosuvastatin after the atorvastatin and could not tolerate it due to myalgias.  She was placed on ezetimibe.  05/10/2021  Vanessa Harmon is seen today in follow-up.  Cholesterol has improved significantly with PCSK9 inhibitor therapy.  Total now 208, triglycerides 215, HDL 57 and LDL 114 (down from 223 previously).  Despite this, however, ideally would like to see her cholesterol lower.  We did get an LP(a) which was low at 33.4.  Her LDL particle numbers still elevated at 1321.  We discussed this finding and possible options for additional therapy.  10/15/2021  Vanessa Harmon returns today for follow-up.  She has had significant additional lipid lowering.  Her LDL particle numbers are now quite low at 377, LDL-C is 48, total cholesterol 137, triglycerides are stable  at 213 and small LDL particle number is low at 136.  She is tolerating the current combination of Repatha, Nexletol and Zetia well.  We talked about combining the Nexletol and Zetia into 1 pill but she feels that she is doing okay with her current pill burden.  PMHx:  Past Medical History:  Diagnosis Date   Adenomatous polyp    Allergy    Anxiety    takes Ativan daily as needed   Arthritis    inflammatory arthritis (Dr. Dagoberto Ligas)   Asthma    Cataract    Chronic back pain    scoliosis    Constipation    takes miralax every other day   Depression    takes Lexapro daily   Eczema    Essential hypertension, benign    takes Hyzaar and Metoprolol daily   Family history of adverse reaction to anesthesia    Sister PONV   Fibromyalgia    GERD (gastroesophageal reflux disease)    History of colon polyps    History of shingles    Hyperlipidemia    takes Atorvastatin daily   Insomnia    takes Melatonin nightly   Joint pain    Osteoarthritis    Osteoporosis    PSVT (paroxysmal supraventricular tachycardia) (HCC)    Recurrent upper respiratory infection (URI)    Rheumatoid arthritis ([place])    sees Dr. Amil Amen; stable on Orencia   Rhinitis, allergic    uses Flonase daily   Urinary urgency    Urticaria     Past Surgical History:  Procedure Laterality Date   ADENOIDECTOMY  APPENDECTOMY     BACK SURGERY  2012   at age 2 d/t scoliosis   BLEPHAROPLASTY  2018   BREAST BIOPSY     cataract surgery     COLONOSCOPY     HARDWARE REMOVAL Left 02/24/2020   Procedure: HARDWARE REMOVAL LEFT DISTAL RADIUS;  Surgeon: Leanora Cover, MD;  Location: [facility];  Service: Orthopedics;  Laterality: Left;   KNEE ARTHROSCOPY Bilateral    neuroma removed from foot     unsure of which foot   OPEN REDUCTION INTERNAL FIXATION (ORIF) DISTAL RADIAL FRACTURE Left 08/15/2019   Procedure: OPEN REDUCTION INTERNAL FIXATION (ORIF) DISTAL RADIAL FRACTURE;  Surgeon: Leanora Cover, MD;   Location: [facility];  Service: Orthopedics;  Laterality: Left;  block in preop   POSTERIOR CERVICAL FUSION/FORAMINOTOMY N/A 05/29/2020   Procedure: Removal of hardware, Replacement of thoracic 8, thoracic 9, thoracic 10 screws, Right Thoracic 6-7, Right Thoracic 7-8 Laminectomies with thoracic hooks from Thoracic 4-5 to Thoracic 10;  Surgeon: Erline Levine, MD;  Location: [facility];  Service: Neurosurgery;  Laterality: N/A;   POSTERIOR LUMBAR FUSION 4 LEVEL N/A 02/15/2018   Procedure: Decompression and fusion Thoracic nine to Lumbar two with exploration of previous fusion;  Surgeon: Erline Levine, MD;  Location: [facility];  Service: Neurosurgery;  Laterality: N/A;   SHOULDER SURGERY Right    TONSILLECTOMY     TOTAL HIP ARTHROPLASTY Right 04/15/2014   Procedure: RIGHT TOTAL HIP ARTHROPLASTY ANTERIOR APPROACH;  Surgeon: Renette Butters, MD;  Location: [facility];  Service: Orthopedics;  Laterality: Right;   TOTAL KNEE ARTHROPLASTY Left 11/26/2019   Procedure: TOTAL KNEE ARTHROPLASTY;  Surgeon: Paralee Cancel, MD;  Location: [facility];  Service: Orthopedics;  Laterality: Left;  70 mins    FAMHx:  Family History  Problem Relation Age of Onset   Stroke Mother    Allergic rhinitis Mother    Allergic rhinitis Father    AAA (abdominal aortic aneurysm) Father    COPD Father    Prostate cancer Father    Hypertension Other        unspecified grandmother   AAA (abdominal aortic aneurysm) Other        unspecified grandfather   Cancer Other        unspecified grandfather   Neuropathy Neg Hx     SOCHx:   reports that she quit smoking about 25 years ago. Her smoking use included cigarettes. She has a 20.00 pack-year smoking history. She has never used smokeless tobacco. She reports that she does not currently use alcohol. She reports that she does not use drugs.  ALLERGIES:  Allergies  Allergen Reactions   Advair Hfa [Fluticasone-Salmeterol] Other (See Comments)    Hoarness   Statins Other (See  Comments)   Breo Ellipta [Fluticasone Furoate-Vilanterol] Other (See Comments)    Severe hoarseness    Fosamax [Alendronate Sodium] Other (See Comments)    myalgias   Levaquin [Levofloxacin] Other (See Comments)    Lightheadedness, not feeling well, ear pain   Doxycycline Hyclate    Infliximab Other (See Comments)    Skin reaction   Spiriva Handihaler [Tiotropium Bromide Monohydrate]     UNSPECIFIED REACTION    Verapamil    Tape Rash    PAPER TAPE: Causes severe rash and weeping    ROS: Pertinent items noted in HPI and remainder of comprehensive ROS otherwise negative.  HOME MEDS: Current Outpatient Medications on File Prior to Visit  Medication Sig Dispense Refill   albuterol (  PROAIR HFA) 108 (90 Base) MCG/ACT inhaler Inhale two puffs every 4-6 hours if needed for cough or wheeze. (Patient taking differently: Inhale 2 puffs into the lungs every 4 (four) hours as needed for wheezing or shortness of breath. Inhale two puffs every 4-6 hours if needed for cough or wheeze.) 8.5 Inhaler 1   amLODipine (NORVASC) 5 MG tablet      aspirin EC 81 MG tablet Take 1 tablet (81 mg total) by mouth daily. Swallow whole. 30 tablet 11   azelastine (ASTELIN) 0.1 % nasal spray Place 1 spray into both nostrils 2 (two) times daily. Use in each nostril as directed     Bempedoic Acid (NEXLETOL) 180 MG TABS Take 1 Dose by mouth daily. 30 tablet 11   budesonide (PULMICORT) 1 MG/2ML nebulizer solution      Calcium Carb-Cholecalciferol (CALCIUM 600/VITAMIN D3 PO) Take 1 tablet by mouth daily.     carboxymethylcellulose (REFRESH PLUS) 0.5 % SOLN Place 1 drop into both eyes 3 (three) times daily as needed (dry eyes).      Cetirizine HCl (ZYRTEC ALLERGY) 10 MG CAPS Zyrtec 10 mg capsule     Cholecalciferol 125 MCG (5000 UT) TABS Vitamin D3 125 mcg (5,000 unit) tablet  Take 1 tablet every day by oral route.     denosumab (PROLIA) 60 MG/ML SOSY injection Inject 60 mg into the skin every 6 (six) months. Courier to  [facility]he[facility]: [address], [place], [place] [address]. Appt on 10/28/21 1 mL 0   diphenhydrAMINE (BENADRYL) 25 MG tablet Take 50 mg by mouth daily as needed for allergies.     docusate sodium (COLACE) 100 MG capsule      escitalopram (LEXAPRO) 20 MG tablet Take 1 tablet (20 mg total) by mouth daily. 90 tablet 1   Evolocumab (REPATHA SURECLICK) 174 MG/ML SOAJ Inject 1 Dose into the skin every 14 (fourteen) days. 2 mL 11   ezetimibe (ZETIA) 10 MG tablet TAKE ONE TABLET BY MOUTH DAILY 90 tablet 0   famotidine (PEPCID) 40 MG tablet TAKE 1 TABLET (40 MG TOTAL) BY MOUTH AT BEDTIME. 30 tablet 5   fluticasone (FLOVENT HFA) 220 MCG/ACT inhaler Inhale 2 puffs into the lungs 2 (two) times daily.     furosemide (LASIX) 40 MG tablet Take by mouth.     gabapentin (NEURONTIN) 300 MG capsule Take 300 mg by mouth at bedtime.     guaiFENesin (MUCINEX) 600 MG 12 hr tablet Take 600 mg by mouth 2 (two) times daily as needed for cough or to loosen phlegm.     HYDROcodone-acetaminophen (NORCO) 10-325 MG tablet 1 tablet as needed     indomethacin (INDOCIN SR) 75 MG CR capsule TAKE ONE CAPSULE BY MOUTH DAILY WITH BREAKFAST 90 capsule 0   ipratropium (ATROVENT) 0.03 % nasal spray Place 1 spray into both nostrils 2 (two) times daily as needed for rhinitis.     irbesartan (AVAPRO) 300 MG tablet TAKE ONE TABLET BY MOUTH DAILY 90 tablet 0   leucovorin (WELLCOVORIN) 5 MG tablet Take 1 tablet (5 mg total) by mouth daily. 30 tablet 2   Lifitegrast (XIIDRA) 5 % SOLN Place 1 drop into both eyes in the morning and at bedtime.      methocarbamol (ROBAXIN) 500 MG tablet Take 1 tablet (500 mg total) by mouth every 6 (six) hours as needed for muscle spasms. 60 tablet 2   methotrexate 50 MG/2ML injection Inject 0.6 mLs (15 mg total) into the skin once a week. 8  mL 0   montelukast (SINGULAIR) 10 MG tablet Take 1 tablet (10 mg total) by mouth at bedtime. 30 tablet 5   Multiple Vitamin (MULTIVITAMIN) capsule Take 1 capsule by mouth daily.      Needles & Syringes (1CC TB SYRINGE) MISC 27g 1/2"     omeprazole (PRILOSEC) 40 MG capsule TAKE ONE CAPSULE BY MOUTH DAILY BEFORE BREAKFAST DAILY (Patient taking differently: Take 40 mg by mouth daily. TAKE ONE CAPSULE BY MOUTH DAILY BEFORE BREAKFAST DAILY) 30 capsule 4   OVER THE COUNTER MEDICATION Place 1 application into both eyes at bedtime. Genteal overnight eye ointment     polyethylene glycol powder (GLYCOLAX/MIRALAX) 17 GM/SCOOP powder Miralax 17 gram/dose oral powder     predniSONE (DELTASONE) 5 MG tablet Take 1 tablet (5 mg total) by mouth daily with breakfast. 30 tablet 2   PRESCRIPTION MEDICATION Place 1 spray into the nose in the morning and at bedtime. Budesonide 0.5 mg / 2 mL Nasal rinse     pseudoephedrine (SUDAFED) 30 MG tablet Take 30 mg by mouth every 4 (four) hours as needed for congestion.     Tocilizumab (ACTEMRA) 162 MG/0.9ML SOSY Inject 0.9 mLs (162 mg total) into the skin every 14 (fourteen) days. 5.4 mL 0   traZODone (DESYREL) 150 MG tablet TAKE ONE TABLET BY MOUTH DAILY 90 tablet 0   TUBERCULIN SYR 1CC/27GX1/2" (B-D TB SYRINGE 1CC/27GX1/2") 27G X 1/2" 1 ML MISC 12 Syringes by Does not apply route once a week. 12 each 3   valACYclovir (VALTREX) 1000 MG tablet Take 1,000 mg by mouth daily.      vitamin C (ASCORBIC ACID) 500 MG tablet Take 500 mg by mouth daily.     No current facility-administered medications on file prior to visit.    LABS/IMAGING: No results found for this or any previous visit (from the past 48 hour(s)). No results found.  LIPID PANEL:    Component Value Date/Time   CHOL 305 (H) 07/29/2020 1100   TRIG 149.0 07/29/2020 1100   HDL 52.50 07/29/2020 1100   CHOLHDL 6 07/29/2020 1100   VLDL 29.8 07/29/2020 1100   LDLCALC 223 (H) 07/29/2020 1100   LDLCALC 126 (H) 04/13/2017 1201    WEIGHTS: Wt Readings from Last 3 Encounters:  10/15/21 175 lb 8 oz (79.6 kg)  09/10/21 178 lb (80.7 kg)  07/27/21 177 lb 12.8 oz (80.6 kg)    VITALS: BP  130/64   Pulse (!) 58   Ht '5\' 6"'$  (1.676 m)   Wt 175 lb 8 oz (79.6 kg)   SpO2 97%   BMI 28.33 kg/m   EXAM: Deferred  EKG: N/A  ASSESSMENT: Probable familial hyperlipidemia, LDL greater than 190 (by Baruch Merl criteria Family history of premature stroke in her mother (earlier than age 51) Marked dyslipidemia in her mother and brother Statin intolerance-myalgias, on ezetimibe Rheumatoid arthritis  PLAN: 1.   Vanessa Harmon has had a substantial reduction in her lipids with a very low LDL particle number and small LDL particle number and well-controlled LDL cholesterol at 48.  Her triglycerides remain elevated but this is a minor additional risk factor and I think she is very well treated.  I would plan on continuing her current course of therapy.  We will see her back annually or sooner as necessary.  Pixie Casino, MD, [facility], [place] Director of the [facility] Lipid Disorders &  Cardiovascular Risk Reduction Clinic Diplomate of the American Board  of Clinical Lipidology Attending Cardiologist  Direct Dial: [phone]  Fax: [phone]  Website:  [email or web address] Arnecia Ector 10/15/2021, 11:31 AM

## 2021-10-18 ENCOUNTER — Other Ambulatory Visit: Payer: Self-pay | Admitting: *Deleted

## 2021-10-18 DIAGNOSIS — M810 Age-related osteoporosis without current pathological fracture: Secondary | ICD-10-CM

## 2021-10-18 DIAGNOSIS — Z79899 Other long term (current) drug therapy: Secondary | ICD-10-CM

## 2021-10-19 LAB — CBC WITH DIFFERENTIAL/PLATELET
Absolute Monocytes: 528 cells/uL (ref 200–950)
Basophils Absolute: 27 cells/uL (ref 0–200)
Basophils Relative: 0.3 %
Eosinophils Absolute: 27 cells/uL (ref 15–500)
Eosinophils Relative: 0.3 %
HCT: 37 % (ref 35.0–45.0)
Hemoglobin: 12.9 g/dL (ref 11.7–15.5)
Lymphs Abs: 673 cells/uL — ABNORMAL LOW (ref 850–3900)
MCH: 35.4 pg — ABNORMAL HIGH (ref 27.0–33.0)
MCHC: 34.9 g/dL (ref 32.0–36.0)
MCV: 101.6 fL — ABNORMAL HIGH (ref 80.0–100.0)
MPV: 11.1 fL (ref 7.5–12.5)
Monocytes Relative: 5.8 %
Neutro Abs: 7844 cells/uL — ABNORMAL HIGH (ref 1500–7800)
Neutrophils Relative %: 86.2 %
Platelets: 282 10*3/uL (ref 140–400)
RBC: 3.64 10*6/uL — ABNORMAL LOW (ref 3.80–5.10)
RDW: 14.2 % (ref 11.0–15.0)
Total Lymphocyte: 7.4 %
WBC: 9.1 10*3/uL (ref 3.8–10.8)

## 2021-10-19 LAB — COMPLETE METABOLIC PANEL WITH GFR
AG Ratio: 2.7 (calc) — ABNORMAL HIGH (ref 1.0–2.5)
ALT: 21 U/L (ref 6–29)
AST: 22 U/L (ref 10–35)
Albumin: 4.6 g/dL (ref 3.6–5.1)
Alkaline phosphatase (APISO): 33 U/L — ABNORMAL LOW (ref 37–153)
BUN/Creatinine Ratio: 20 (calc) (ref 6–22)
BUN: 25 mg/dL (ref 7–25)
CO2: 30 mmol/L (ref 20–32)
Calcium: 9.9 mg/dL (ref 8.6–10.4)
Chloride: 100 mmol/L (ref 98–110)
Creat: 1.22 mg/dL — ABNORMAL HIGH (ref 0.60–1.00)
Globulin: 1.7 g/dL (calc) — ABNORMAL LOW (ref 1.9–3.7)
Glucose, Bld: 144 mg/dL — ABNORMAL HIGH (ref 65–99)
Potassium: 4.3 mmol/L (ref 3.5–5.3)
Sodium: 138 mmol/L (ref 135–146)
Total Bilirubin: 0.7 mg/dL (ref 0.2–1.2)
Total Protein: 6.3 g/dL (ref 6.1–8.1)
eGFR: 47 mL/min/{1.73_m2} — ABNORMAL LOW (ref >=60)

## 2021-10-19 LAB — VITAMIN D 25 HYDROXY (VIT D DEFICIENCY, FRACTURES): Vit D, 25-Hydroxy: 53 ng/mL (ref 30–100)

## 2021-10-21 NOTE — Telephone Encounter (Signed)
Prolia received. Placed in refrigerator.  Knox Saliva, PharmD, MPH, BCPS, CPP Clinical Pharmacist (Rheumatology and Pulmonology)

## 2021-10-25 ENCOUNTER — Encounter: Payer: Self-pay | Admitting: Physician Assistant

## 2021-10-25 ENCOUNTER — Other Ambulatory Visit: Payer: Self-pay | Admitting: Physician Assistant

## 2021-10-25 ENCOUNTER — Ambulatory Visit (INDEPENDENT_AMBULATORY_CARE_PROVIDER_SITE_OTHER): Payer: Medicare Other | Admitting: Physician Assistant

## 2021-10-25 VITALS — BP 130/78 | HR 61 | Temp 98.0°F | Ht 66.0 in | Wt 174.0 lb

## 2021-10-25 DIAGNOSIS — M459 Ankylosing spondylitis of unspecified sites in spine: Secondary | ICD-10-CM

## 2021-10-25 DIAGNOSIS — R7989 Other specified abnormal findings of blood chemistry: Secondary | ICD-10-CM

## 2021-10-25 DIAGNOSIS — E118 Type 2 diabetes mellitus with unspecified complications: Secondary | ICD-10-CM

## 2021-10-25 DIAGNOSIS — I10 Essential (primary) hypertension: Secondary | ICD-10-CM

## 2021-10-25 DIAGNOSIS — F3289 Other specified depressive episodes: Secondary | ICD-10-CM

## 2021-10-25 LAB — COMPREHENSIVE METABOLIC PANEL
ALT: 25 U/L (ref 0–35)
AST: 25 U/L (ref 0–37)
Albumin: 4.7 g/dL (ref 3.5–5.2)
Alkaline Phosphatase: 30 U/L — ABNORMAL LOW (ref 39–117)
BUN: 24 mg/dL — ABNORMAL HIGH (ref 6–23)
CO2: 30 mEq/L (ref 19–32)
Calcium: 10.4 mg/dL (ref 8.4–10.5)
Chloride: 99 mEq/L (ref 96–112)
Creatinine, Ser: 1.19 mg/dL (ref 0.40–1.20)
GFR: 45.69 mL/min — ABNORMAL LOW (ref >60.00)
Glucose, Bld: 123 mg/dL — ABNORMAL HIGH (ref 70–99)
Potassium: 4.5 mEq/L (ref 3.5–5.1)
Sodium: 138 mEq/L (ref 135–145)
Total Bilirubin: 0.9 mg/dL (ref 0.2–1.2)
Total Protein: 6.5 g/dL (ref 6.0–8.3)

## 2021-10-25 LAB — HEMOGLOBIN A1C: Hgb A1c MFr Bld: 5.9 % (ref 4.6–6.5)

## 2021-10-25 MED ORDER — ESCITALOPRAM OXALATE 20 MG PO TABS: 20.0000 mg | ORAL_TABLET | Freq: Every day | ORAL | 3 refills | Status: DC

## 2021-10-25 MED ORDER — FUROSEMIDE 20 MG PO TABS: 20.0000 mg | ORAL_TABLET | Freq: Every day | ORAL | 3 refills | Status: DC

## 2021-10-25 MED ORDER — GABAPENTIN 300 MG PO CAPS
300.0000 mg | ORAL_CAPSULE | Freq: Every day | ORAL | 3 refills | Status: AC
Start: 2021-10-25 — End: ?

## 2021-10-25 NOTE — Patient Instructions (Signed)
It was great to see you!  Update blood work today to recheck kidney function and A1c.  Med refills sent and reduced lasix dosage sent in.  Let's follow-up in 6 months, sooner if you have concerns.  If a referral was placed today --> you will be contacted for an appointment. Please note that routine referrals can sometimes take up to 3-4 weeks to process. Please call our office if you haven't heard anything after this time frame.  If blood work, urine studies, or any imaging was ordered today -->  we will release your results to you on your MyChart account (if you have chosen to sign up for this) with further instructions. You may see the results before I do, but when I review them I will send you a message with my report or have my staff call you if things need to be discussed. Please reply to my message with any questions.   Take care,  Inda Coke PA-C

## 2021-10-25 NOTE — Progress Notes (Signed)
Vanessa Harmon is a 71 y.o. female here for a follow up on anxiety.   History of Present Illness:   Chief Complaint  Patient presents with   Depression   Hypertension    Pt is checking Bp at home averaging 130/84.    HPI  Anxiety/Depression  Patient here to 3 months follow-up. She is currently compliant with taking Lexapro 20 mg daily with no complications. Feels like her mood has improved from previous visit. Tolerating her medication well with no side effects. Denies any worsening symptoms. She is requesting refill on her medication today. Denies SI/HI  HTN Currently taking Irbesartan 300 mg daily, Amlodipine 5 mg daily, and Lasix 40 mg daily with no complications. At home blood pressure readings are: checked regularly and averaging usually 130/84. She would like [place] to take management for her BP medications. Patient denies chest pain, SOB, blurred vision, dizziness, unusual headaches, lower leg swelling. Patient is  compliant with medication. She would like to decrease Lasix dose at this time. Denies excessive caffeine intake, stimulant usage, excessive alcohol intake, or increase in salt consumption.  BP Readings from Last 3 Encounters:  10/25/21 130/78  10/15/21 130/64  09/20/21 (!) 167/96    Rheumatoid Arthritis; Chronic low back pain Patient has been dealing with this for several years. She recently transitioned care from [facility] Rheumatology to [facility]. She is following up with her Rheumatologist for back pain. Pt is currently compliant with taking Orencia 250 mg injection, 15 mg methotrexate injection weekly, gabapentin 300 mg nightly, robaxin 500 mg daily as needed, prednisone 5 mg daily. Has tried aquatic therapy in the past but does not feel this worked well for her. She is requesting refill on Gabapentin today. Denies any worsening sx.   DM She would like to recheck her HgbA1c today. She has never been on medications for DM or insulin resistance.   Elevated  serum creatinine Hx of elevated creatinine on recent lab check. She suspects that she was not well hydrated and would like this re-checked.  Past Medical History:  Diagnosis Date   Adenomatous polyp    Allergy    Anxiety    takes Ativan daily as needed   Arthritis    inflammatory arthritis (Dr. Dagoberto Ligas)   Asthma    Cataract    Chronic back pain    scoliosis    Constipation    takes miralax every other day   Depression    takes Lexapro daily   Eczema    Essential hypertension, benign    takes Hyzaar and Metoprolol daily   Family history of adverse reaction to anesthesia    Sister PONV   Fibromyalgia    GERD (gastroesophageal reflux disease)    History of colon polyps    History of shingles    Hyperlipidemia    takes Atorvastatin daily   Insomnia    takes Melatonin nightly   Joint pain    Osteoarthritis    Osteoporosis    PSVT (paroxysmal supraventricular tachycardia) (HCC)    Recurrent upper respiratory infection (URI)    Rheumatoid arthritis ([place])    sees Dr. Amil Amen; stable on Orencia   Rhinitis, allergic    uses Flonase daily   Urinary urgency    Urticaria      Social History   Tobacco Use   Smoking status: Former    Packs/day: 1.00    Years: 20.00    Total pack years: 20.00    Types: Cigarettes  Quit date: 40    Years since quitting: 25.5   Smokeless tobacco: Never   Tobacco comments:    hasn't smoked in 47yr  Vaping Use   Vaping Use: Never used  Substance Use Topics   Alcohol use: Not Currently    Comment: rarely with dinner   Drug use: No    Past Surgical History:  Procedure Laterality Date   ADENOIDECTOMY     APPENDECTOMY     BACK SURGERY  2012   at age [age]d/t scoliosis   BLEPHAROPLASTY  2018   BREAST BIOPSY     cataract surgery     COLONOSCOPY     HARDWARE REMOVAL Left 02/24/2020   Procedure: HARDWARE REMOVAL LEFT DISTAL RADIUS;  Surgeon: KLeanora Cover MD;  [place]  Service: Orthopedics;   Laterality: Left;   KNEE ARTHROSCOPY Bilateral    neuroma removed from foot     unsure of which foot   OPEN REDUCTION INTERNAL FIXATION (ORIF) DISTAL RADIAL FRACTURE Left 08/15/2019   Procedure: OPEN REDUCTION INTERNAL FIXATION (ORIF) DISTAL RADIAL FRACTURE;  Surgeon: KLeanora Cover MD;  [place] [facility]  Service: Orthopedics;  Laterality: Left;  block in preop   POSTERIOR CERVICAL FUSION/FORAMINOTOMY N/A 05/29/2020   Procedure: Removal of hardware, Replacement of thoracic 8, thoracic 9, thoracic 10 screws, Right Thoracic 6-7, Right Thoracic 7-8 Laminectomies with thoracic hooks from Thoracic 4-5 to Thoracic 10;  Surgeon: SErline Levine MD;  [place] [facility]  Service: Neurosurgery;  Laterality: N/A;   POSTERIOR LUMBAR FUSION 4 LEVEL N/A 02/15/2018   Procedure: Decompression and fusion Thoracic nine to Lumbar two with exploration of previous fusion;  Surgeon: SErline Levine MD;  [place] [facility]  Service: Neurosurgery;  Laterality: N/A;   SHOULDER SURGERY Right    TONSILLECTOMY     TOTAL HIP ARTHROPLASTY Right 04/15/2014   Procedure: RIGHT TOTAL HIP ARTHROPLASTY ANTERIOR APPROACH;  Surgeon: TRenette Butters MD;  [facility]  Service: Orthopedics;  Laterality: Right;   TOTAL KNEE ARTHROPLASTY Left 11/26/2019   Procedure: TOTAL KNEE ARTHROPLASTY;  Surgeon: OParalee Cancel MD;  Location: [facility];  Service: Orthopedics;  Laterality: Left;  70 mins    Family History  Problem Relation Age of Onset   Stroke Mother    Allergic rhinitis Mother    Allergic rhinitis Father    AAA (abdominal aortic aneurysm) Father    COPD Father    Prostate cancer Father    Hypertension Other        unspecified grandmother   AAA (abdominal aortic aneurysm) Other        unspecified grandfather   Cancer Other        unspecified grandfather   Neuropathy Neg Hx     Allergies  Allergen Reactions   Advair Hfa [Fluticasone-Salmeterol] Other (See Comments)    Hoarness   Statins Other (See  Comments)   Breo Ellipta [Fluticasone Furoate-Vilanterol] Other (See Comments)    Severe hoarseness    Fosamax [Alendronate Sodium] Other (See Comments)    myalgias   Levaquin [Levofloxacin] Other (See Comments)    Lightheadedness, not feeling well, ear pain   Doxycycline Hyclate    Infliximab Other (See Comments)    Skin reaction   Spiriva Handihaler [Tiotropium Bromide Monohydrate]     UNSPECIFIED REACTION    Verapamil    Tape Rash    PAPER TAPE: Causes severe rash and weeping    Current Medications:   Current Outpatient Medications:  albuterol (PROAIR HFA) 108 (90 Base) MCG/ACT inhaler, Inhale two puffs every 4-6 hours if needed for cough or wheeze., Disp: 8.5 Inhaler, Rfl: 1   amLODipine (NORVASC) 5 MG tablet, , Disp: , Rfl:    aspirin EC 81 MG tablet, Take 1 tablet (81 mg total) by mouth daily. Swallow whole., Disp: 30 tablet, Rfl: 11   azelastine (ASTELIN) 0.1 % nasal spray, Place 1 spray into both nostrils 2 (two) times daily. Use in each nostril as directed, Disp: , Rfl:    Bempedoic Acid (NEXLETOL) 180 MG TABS, Take 1 Dose by mouth daily., Disp: 30 tablet, Rfl: 11   budesonide (PULMICORT) 1 MG/2ML nebulizer solution, , Disp: , Rfl:    Calcium Carb-Cholecalciferol (CALCIUM 600/VITAMIN D3 PO), Take 1 tablet by mouth daily., Disp: , Rfl:    carboxymethylcellulose (REFRESH PLUS) 0.5 % SOLN, Place 1 drop into both eyes 3 (three) times daily as needed (dry eyes). , Disp: , Rfl:    Cetirizine HCl (ZYRTEC ALLERGY) 10 MG CAPS, Zyrtec 10 mg capsule, Disp: , Rfl:    Cholecalciferol 125 MCG (5000 UT) TABS, Vitamin D3 125 mcg (5,000 unit) tablet  Take 1 tablet every day by oral route., Disp: , Rfl:    denosumab (PROLIA) 60 MG/ML SOSY injection, Inject 60 mg into the skin every 6 (six) months. Courier to rheum: [address] [place], [place] [address]. Appt on 10/28/21, Disp: 1 mL, Rfl: 0   diphenhydrAMINE (BENADRYL) 25 MG tablet, Take 50 mg by mouth daily as needed for allergies.,  Disp: , Rfl:    docusate sodium (COLACE) 100 MG capsule, , Disp: , Rfl:    escitalopram (LEXAPRO) 20 MG tablet, TAKE ONE TABLET BY MOUTH DAILY, Disp: 90 tablet, Rfl: 0   Evolocumab (REPATHA SURECLICK) 867 MG/ML SOAJ, Inject 1 Dose into the skin every 14 (fourteen) days., Disp: 2 mL, Rfl: 11   ezetimibe (ZETIA) 10 MG tablet, TAKE ONE TABLET BY MOUTH DAILY, Disp: 90 tablet, Rfl: 0   famotidine (PEPCID) 40 MG tablet, TAKE 1 TABLET (40 MG TOTAL) BY MOUTH AT BEDTIME., Disp: 30 tablet, Rfl: 5   fluticasone (FLOVENT HFA) 220 MCG/ACT inhaler, Inhale 2 puffs into the lungs 2 (two) times daily., Disp: , Rfl:    furosemide (LASIX) 40 MG tablet, Take by mouth., Disp: , Rfl:    gabapentin (NEURONTIN) 300 MG capsule, Take 300 mg by mouth at bedtime., Disp: , Rfl:    guaiFENesin (MUCINEX) 600 MG 12 hr tablet, Take 600 mg by mouth 2 (two) times daily as needed for cough or to loosen phlegm., Disp: , Rfl:    HYDROcodone-acetaminophen (NORCO) 10-325 MG tablet, 1 tablet as needed, Disp: , Rfl:    indomethacin (INDOCIN SR) 75 MG CR capsule, TAKE ONE CAPSULE BY MOUTH DAILY WITH BREAKFAST, Disp: 90 capsule, Rfl: 0   ipratropium (ATROVENT) 0.03 % nasal spray, Place 1 spray into both nostrils 2 (two) times daily as needed for rhinitis., Disp: , Rfl:    irbesartan (AVAPRO) 300 MG tablet, TAKE ONE TABLET BY MOUTH DAILY, Disp: 90 tablet, Rfl: 0   leucovorin (WELLCOVORIN) 5 MG tablet, Take 1 tablet (5 mg total) by mouth daily., Disp: 30 tablet, Rfl: 2   Lifitegrast (XIIDRA) 5 % SOLN, Place 1 drop into both eyes in the morning and at bedtime. , Disp: , Rfl:    methocarbamol (ROBAXIN) 500 MG tablet, Take 1 tablet (500 mg total) by mouth every 6 (six) hours as needed for muscle spasms., Disp: 60 tablet,  Rfl: 2   methotrexate 50 MG/2ML injection, Inject 0.6 mLs (15 mg total) into the skin once a week., Disp: 8 mL, Rfl: 0   montelukast (SINGULAIR) 10 MG tablet, Take 1 tablet (10 mg total) by mouth at bedtime., Disp: 30 tablet,  Rfl: 5   Multiple Vitamin (MULTIVITAMIN) capsule, Take 1 capsule by mouth daily., Disp: , Rfl:    Needles & Syringes (1CC TB SYRINGE) MISC, 27g 1/2", Disp: , Rfl:    omeprazole (PRILOSEC) 40 MG capsule, TAKE ONE CAPSULE BY MOUTH DAILY BEFORE BREAKFAST DAILY (Patient taking differently: Take 40 mg by mouth daily. TAKE ONE CAPSULE BY MOUTH DAILY BEFORE BREAKFAST DAILY), Disp: 30 capsule, Rfl: 4   OVER THE COUNTER MEDICATION, Place 1 application into both eyes at bedtime. Genteal overnight eye ointment, Disp: , Rfl:    polyethylene glycol powder (GLYCOLAX/MIRALAX) 17 GM/SCOOP powder, Miralax 17 gram/dose oral powder, Disp: , Rfl:    predniSONE (DELTASONE) 5 MG tablet, Take 1 tablet (5 mg total) by mouth daily with breakfast., Disp: 30 tablet, Rfl: 2   PRESCRIPTION MEDICATION, Place 1 spray into the nose in the morning and at bedtime. Budesonide 0.5 mg / 2 mL Nasal rinse, Disp: , Rfl:    pseudoephedrine (SUDAFED) 30 MG tablet, Take 30 mg by mouth every 4 (four) hours as needed for congestion., Disp: , Rfl:    Tocilizumab (ACTEMRA) 162 MG/0.9ML SOSY, Inject 0.9 mLs (162 mg total) into the skin every 14 (fourteen) days., Disp: 5.4 mL, Rfl: 0   traZODone (DESYREL) 150 MG tablet, TAKE ONE TABLET BY MOUTH DAILY, Disp: 90 tablet, Rfl: 0   TUBERCULIN SYR 1CC/27GX1/2" (B-D TB SYRINGE 1CC/27GX1/2") 27G X 1/2" 1 ML MISC, 12 Syringes by Does not apply route once a week., Disp: 12 each, Rfl: 3   valACYclovir (VALTREX) 1000 MG tablet, Take 1,000 mg by mouth daily. , Disp: , Rfl:    vitamin C (ASCORBIC ACID) 500 MG tablet, Take 500 mg by mouth daily., Disp: , Rfl:    Review of Systems:   ROS Negative unless otherwise specified per HPI.   Vitals:   Vitals:   10/25/21 1304  BP: 130/78  Pulse: 61  Temp: 98 F (36.7 C)  TempSrc: Temporal  SpO2: 96%  Weight: 174 lb (78.9 kg)  Height: '5\' 6"'$  (1.676 m)     Body mass index is 28.08 kg/m.  Physical Exam:   Physical Exam Vitals and nursing note reviewed.   Constitutional:      General: She is not in acute distress.    Appearance: She is well-developed. She is not ill-appearing or toxic-appearing.  Cardiovascular:     Rate and Rhythm: Normal rate and regular rhythm.     Pulses: Normal pulses.     Heart sounds: Normal heart sounds, S1 normal and S2 normal.  Pulmonary:     Effort: Pulmonary effort is normal.     Breath sounds: Normal breath sounds.  Skin:    General: Skin is warm and dry.  Neurological:     Mental Status: She is alert.     GCS: GCS eye subscore is 4. GCS verbal subscore is 5. GCS motor subscore is 6.  Psychiatric:        Speech: Speech normal.        Behavior: Behavior normal. Behavior is cooperative.     Assessment and Plan:   Essential hypertension Normotensive Continue Irbesartan 300 mg daily, Amlodipine 5 mg daily Decrease Lasix 40 mg daily to 20 mg daily Follow-up  in 6 months, sooner if concerns  Elevated serum creatinine Recheck today Continue to push fluids  Controlled type 2 diabetes mellitus with complication, without long-term current use of insulin (HCC) Update A1c and provide recommendations accordingly Continue efforts at healthy lifestyle  Other depression Improved, per patient Continue lexapro 20 mg daily Follow-up in 6 months, sooner if concerns I discussed with patient that if they develop any SI, to tell someone immediately and seek medical attention.  Rheumatoid arthritis involving vertebra with positive rheumatoid factor (HCC) Overall controlled Refill gabapentin 300 mg daily Follow-up in 6 months, sooner if concerns  I,Savera Zaman,acting as a scribe for [employer], PA.,have documented all relevant documentation on the behalf of Inda Coke, PA,as directed by  Inda Coke, PA while in the presence of Inda Coke, [place].   I, Inda Coke, [place], have reviewed all documentation for this visit. The documentation on 10/25/21 for the exam, diagnosis, procedures, and orders  are all accurate and complete.  Inda Coke, PA-C

## 2021-10-26 ENCOUNTER — Telehealth: Payer: Self-pay | Admitting: Internal Medicine

## 2021-10-26 NOTE — Telephone Encounter (Signed)
Patient called to cancel her appointment scheduled for 10/28/21 at 11:30 am with [facility] for Prolia injection.  Patient states [place] is at the beach and will return next week.  Patient is available on Monday, 7/17, Wednesday, 7/19, and Thursday, 7/20.  Patient states she "does not do mornings" and needed another 11:30 am appt.  Please advise.

## 2021-10-27 NOTE — Telephone Encounter (Signed)
Patient r/s for Prolia injection on 11/01/21 @ 11:30am  Knox Saliva, PharmD, MPH, BCPS, CPP Clinical Pharmacist (Rheumatology and Pulmonology)

## 2021-10-28 ENCOUNTER — Ambulatory Visit: Payer: Medicare Other | Admitting: Pharmacist

## 2021-11-01 ENCOUNTER — Ambulatory Visit (INDEPENDENT_AMBULATORY_CARE_PROVIDER_SITE_OTHER): Payer: Medicare Other | Admitting: Pharmacist

## 2021-11-01 VITALS — BP 164/97 | HR 56

## 2021-11-01 DIAGNOSIS — Z7689 Persons encountering health services in other specified circumstances: Secondary | ICD-10-CM | POA: Diagnosis not present

## 2021-11-01 DIAGNOSIS — Z79899 Other long term (current) drug therapy: Secondary | ICD-10-CM

## 2021-11-01 DIAGNOSIS — M810 Age-related osteoporosis without current pathological fracture: Secondary | ICD-10-CM

## 2021-11-01 MED ORDER — DENOSUMAB 60 MG/ML ~~LOC~~ SOSY
60.0000 mg | PREFILLED_SYRINGE | Freq: Once | SUBCUTANEOUS | Status: AC
  Administered 2021-11-01: 60 mg via SUBCUTANEOUS
  Filled 2021-11-01: qty 1

## 2021-11-01 NOTE — Patient Instructions (Addendum)
I've placed an order for an update bone density with [facility]. They will reach out to you to schedule this.  Your next Prolia is due 04/30/2022. We will reach out regarding labwork closer to this date.  Thanks for coming in!

## 2021-11-01 NOTE — Progress Notes (Signed)
Pharmacy Note  Subjective:   Patient presents to clinic today to receive bi-annual dose of Prolia. She was due on 10/28/21. She is transitioning Prolia management to Dr. Benjamine Mola (since she has also transferred rheumatologists)  Patient running a fever or have signs/symptoms of infection? No  Patient currently on antibiotics for the treatment of infection? No  Patient had fall in the last 6 months?  No    Patient taking calcium 1200 mg daily through diet or supplement and at least 800 units vitamin D? Yes  Objective: CMP     Component Value Date/Time   NA 138 10/25/2021 1328   NA 139 04/05/2021 0000   K 4.5 10/25/2021 1328   CL 99 10/25/2021 1328   CO2 30 10/25/2021 1328   GLUCOSE 123 (H) 10/25/2021 1328   BUN 24 (H) 10/25/2021 1328   BUN 13 04/05/2021 0000   CREATININE 1.19 10/25/2021 1328   CREATININE 1.22 (H) 10/18/2021 1359   CALCIUM 10.4 10/25/2021 1328   PROT 6.5 10/25/2021 1328   ALBUMIN 4.7 10/25/2021 1328   AST 25 10/25/2021 1328   ALT 25 10/25/2021 1328   ALKPHOS 30 (L) 10/25/2021 1328   BILITOT 0.9 10/25/2021 1328   GFRNONAA 63 04/05/2021 0000   GFRNONAA >60 05/27/2020 1333   GFRNONAA 57 (L) 04/13/2017 1201   GFRAA >60 11/27/2019 0244   GFRAA 66 04/13/2017 1201    CBC    Component Value Date/Time   WBC 9.1 10/18/2021 1359   RBC 3.64 (L) 10/18/2021 1359   HGB 12.9 10/18/2021 1359   HCT 37.0 10/18/2021 1359   PLT 282 10/18/2021 1359   MCV 101.6 (H) 10/18/2021 1359   MCH 35.4 (H) 10/18/2021 1359   MCHC 34.9 10/18/2021 1359   RDW 14.2 10/18/2021 1359   LYMPHSABS 673 (L) 10/18/2021 1359   MONOABS 0.8 07/29/2020 1100   EOSABS 27 10/18/2021 1359   BASOSABS 27 10/18/2021 1359    Lab Results  Component Value Date   VD25OH 53 10/18/2021    DEXA on 04/27/17 - -1.3 at left distal radius. Statistically significant increase in left femoral neck and decrease in left distal radius  Assessment/Plan:   Patient tolerated injection without issue. Next dose of  Prolia is due 1/13/20243  Administrations This Visit     denosumab (PROLIA) injection 60 mg     Admin Date 11/01/2021 Action Given Dose 60 mg Route Subcutaneous Administered By Cassandria Anger, RPH-CPP           Order placed for updated DEXA for [facility]. Patient last updated DEXA in 2019.  Orders Placed This Encounter  Procedures   DG BONE DENSITY (DXA)    last DEXA 2019, on Prolia q 68month [facility]    Order Specific Question:   Reason for Exam (SYMPTOM  OR DIAGNOSIS REQUIRED)    Answer:   age-related osteoporosis, last DEXA 2019    Order Specific Question:   Preferred imaging location?    Answer:   External   All questions encouraged and answered.  Instructed patient to call with any further questions or concerns.  DKnox Saliva PharmD, MPH, BCPS, CPP Clinical Pharmacist (Rheumatology and Pulmonology)

## 2021-11-01 NOTE — Progress Notes (Signed)
Chronic Care Management Pharmacy Note  11/15/2021 Name:  Vanessa Harmon MRN:  [id number] DOB:  [birth year]  Summary: Vanessa Harmon with PharmD.  Lipids improved.  She does mention period of light headedness after her morning meds occasionally.  Dehydration improved with reduced Lasix, BP remains controlled   Recommendations/Changes made from today's visit: Consider Lexapro dosing at night to see if this helps symptoms  Plan: FU 3 months CMA to call in 30 days to assess symptoms   Subjective: Vanessa Harmon is an 71 y.o. year old female who is a primary patient of Inda Coke, [place].  The CCM team was consulted for assistance with disease management and care coordination needs.    Engaged with patient by telephone for follow up visit in response to provider referral for pharmacy case management and/or care coordination services.   Consent to Services:  The patient was given the following information about Chronic Care Management services today, agreed to services, and gave verbal consent: 1. CCM service includes personalized support from designated clinical staff supervised by the primary care provider, including individualized plan of care and coordination with other care providers 2. 24/7 contact phone numbers for assistance for urgent and routine care needs. 3. Service will only be billed when office clinical staff spend 20 minutes or more in a month to coordinate care. 4. Only one practitioner may furnish and bill the service in a calendar month. 5.The patient may stop CCM services at any time (effective at the end of the month) by phone call to the office staff. 6. The patient will be responsible for cost sharing (co-pay) of up to 20% of the service fee (after annual deductible is met). Patient agreed to services and consent obtained.  Patient Care Team: Inda Coke, [place] as PCP - General (Physician Assistant) Hennie Duos, MD as Consulting Physician (Rheumatology) Erline Levine, MD as Consulting Physician (Neurosurgery) Richardean Sale, MD as Referring Physician (Allergy and Immunology) Clent Jacks, MD as Consulting Physician (Ophthalmology) Zadie Rhine Clent Demark, MD as Consulting Physician ([facility]) Paralee Cancel, MD as Consulting Physician (Orthopedic Surgery) Rosita Fire, MD as Consulting Physician (Nephrology) Edythe Clarity, [facility] as Pharmacist (Pharmacist)  Recent office visits:  07/27/2021 OV (PCP) Inda Coke, [place]; Increase lexapro to 20 mg daily    Recent consult visits:  06/30/2021 OV (Vascular Surgeon) Angelia Mould, MD; no medication changes indicated.   05/10/2021 OV (Cardiology) Pixie Casino, MD;  We discussed the possibility of adding Nexletol to her therapy.  This could give [place] another 20 to 25% reduction in her lipids and ultimately be combined with her Zetia as Nexlizet, which could help with compliance.  She seemed interested in this.  We will provide a couple weeks of samples and if well-tolerated pursue Nexlizet therapy.   05/06/2021 OV (Allergy) Richardean Sale, MD; She will otherwise take azelastine two sprays per nostril twice daily and Zyrtec 10 mg once daily. She will continue on budesonide lavage 0.5 mg twice daily but discussed that she can increase this dosage to 1 mg twice daily when she anticipates increased symptoms or has increased symptoms. Discussed that per her rheumatologist recommendations, she could try to start a different medication to treat active RA to help overall improve autoimmune/allergy symptoms   Hospital visits:  None in previous 6 months   Objective:  Lab Results  Component Value Date   CREATININE 1.19 10/25/2021   BUN 24 (H) 10/25/2021   GFR 45.69 (L) 10/25/2021   EGFR 47 (  L) 10/18/2021   GFRNONAA 63 04/05/2021   GFRAA >60 11/27/2019   NA 138 10/25/2021   K 4.5 10/25/2021   CALCIUM 10.4 10/25/2021   CO2 30 10/25/2021   GLUCOSE 123 (H) 10/25/2021    Lab Results   Component Value Date/Time   HGBA1C 5.9 10/25/2021 01:28 PM   HGBA1C 5.7 (A) 07/27/2021 02:26 PM   HGBA1C 5.8 07/29/2020 11:00 AM   GFR 45.69 (L) 10/25/2021 01:28 PM   GFR 65.28 08/26/2020 02:04 PM    Last diabetic Eye exam: No results found for: "HMDIABEYEEXA"  Last diabetic Foot exam: No results found for: "HMDIABFOOTEX"   Lab Results  Component Value Date   CHOL 305 (H) 07/29/2020   HDL 52.50 07/29/2020   LDLCALC 223 (H) 07/29/2020   TRIG 149.0 07/29/2020   CHOLHDL 6 07/29/2020       Latest Ref Rng & Units 10/25/2021    1:28 PM 10/18/2021    1:59 PM 09/10/2021   11:08 AM  Hepatic Function  Total Protein 6.0 - 8.3 g/dL 6.5  6.3  6.7   Albumin 3.5 - 5.2 g/dL 4.7     AST 0 - 37 U/L '25  22  16   ' ALT 0 - 35 U/L '25  21  18   ' Alk Phosphatase 39 - 117 U/L 30     Total Bilirubin 0.2 - 1.2 mg/dL 0.9  0.7  0.4     Lab Results  Component Value Date/Time   TSH 0.77 09/04/2020 02:46 PM   TSH 0.75 08/19/2020 12:00 AM   TSH 3.36 04/13/2017 12:01 PM   FREET4 0.85 09/04/2020 02:46 PM   FREET4 1.03 06/03/2013 10:12 AM       Latest Ref Rng & Units 10/18/2021    1:59 PM 09/10/2021   11:08 AM 07/29/2020   11:00 AM  CBC  WBC 3.8 - 10.8 Thousand/uL 9.1  9.9  8.9   Hemoglobin 11.7 - 15.5 g/dL 12.9  13.5  12.3   Hematocrit 35.0 - 45.0 % 37.0  39.8  36.2   Platelets 140 - 400 Thousand/uL 282  318  363.0     Lab Results  Component Value Date/Time   VD25OH 53 10/18/2021 01:59 PM   VD25OH 46 04/13/2017 12:01 PM    Clinical ASCVD: Yes  The 10-year ASCVD risk score (Arnett DK, et al., 2019) is: 27.1%   Values used to calculate the score:     Age: 40 years     Sex: Female     Is Non-Hispanic African American: No     Diabetic: Yes     Tobacco smoker: No     Systolic Blood Pressure: 683 mmHg     Is BP treated: Yes     HDL Cholesterol: 52.5 mg/dL     Total Cholesterol: 137 mg/dL       10/25/2021    1:08 PM 07/08/2021    1:32 PM 01/22/2021    2:26 PM  Depression screen PHQ 2/9   Decreased Interest 1 0 1  Down, Depressed, Hopeless 1 0 1  PHQ - 2 Score 2 0 2  Altered sleeping 1  1  Tired, decreased energy 1  2  Change in appetite 0  1  Feeling bad or failure about yourself  0  1  Trouble concentrating 0  0  Moving slowly or fidgety/restless 0  0  Suicidal thoughts 0  0  PHQ-9 Score 4  7  Difficult doing work/chores Somewhat difficult  Somewhat difficult  Social History   Tobacco Use  Smoking Status Former   Packs/day: 1.00   Years: 20.00   Total pack years: 20.00   Types: Cigarettes   Quit date: 1998   Years since quitting: 25.5  Smokeless Tobacco Never  Tobacco Comments   hasn't smoked in 50yr   BP Readings from Last 3 Encounters:  11/01/21 (!) 164/97  10/25/21 130/78  10/15/21 130/64   Pulse Readings from Last 3 Encounters:  11/01/21 (!) 56  10/25/21 61  10/15/21 (!) 58   Wt Readings from Last 3 Encounters:  10/25/21 174 lb (78.9 kg)  10/15/21 175 lb 8 oz (79.6 kg)  09/10/21 178 lb (80.7 kg)   BMI Readings from Last 3 Encounters:  10/25/21 28.08 kg/m  10/15/21 28.33 kg/m  09/10/21 28.73 kg/m    Assessment/Interventions: Review of patient past medical history, allergies, medications, health status, including review of consultants reports, laboratory and other test data, was performed as part of comprehensive evaluation and provision of chronic care management services.   SDOH:  (Social Determinants of Health) assessments and interventions performed: No, done within last year  Financial Resource Strain: Low Risk  (07/08/2021)   Overall Financial Resource Strain (CARDIA)    Difficulty of Paying Living Expenses: Not hard at all   Food Insecurity: No Food Insecurity (07/08/2021)   Hunger Vital Sign    Worried About Running Out of Food in the Last Year: Never true    Ran Out of Food in the Last Year: Never true    SDOH Screenings   Alcohol Screen: Not on file  Depression (PHQ2-9): Low Risk  (10/25/2021)   Depression  (PHQ2-9)    PHQ-2 Score: 4  Financial Resource Strain: Low Risk  (07/08/2021)   Overall Financial Resource Strain (CARDIA)    Difficulty of Paying Living Expenses: Not hard at all  Food Insecurity: No Food Insecurity (07/08/2021)   Hunger Vital Sign    Worried About Running Out of Food in the Last Year: Never true    Ran Out of Food in the Last Year: Never true  Housing: Low Risk  (07/08/2021)   Housing    Last Housing Risk Score: 0  Physical Activity: Inactive (07/08/2021)   Exercise Vital Sign    Days of Exercise per Week: 0 days    Minutes of Exercise per Session: 0 min  Social Connections: Moderately Isolated (07/08/2021)   Social Connection and Isolation Panel [NHANES]    Frequency of Communication with Friends and Family: More than three times a week    Frequency of Social Gatherings with Friends and Family: More than three times a week    Attends Religious Services: Never    AMarine scientistor Organizations: No    Attends CArchivistMeetings: Never    Marital Status: Married  Stress: No Stress Concern Present (07/08/2021)   [place]   Feeling of Stress : Not at all  Tobacco Use: Medium Risk (10/25/2021)   Patient History    Smoking Tobacco Use: Former    Smokeless Tobacco Use: Never    Passive Exposure: Not on file  Transportation Needs: No Transportation Needs (07/08/2021)   PRAPARE - THydrologist(Medical): No    Lack of Transportation (Non-Medical): No    CCM Care Plan  Allergies  Allergen Reactions   Advair Hfa [Fluticasone-Salmeterol] Other (See Comments)    Hoarness   Statins Other (See Comments)  Breo Ellipta [Fluticasone Furoate-Vilanterol] Other (See Comments)    Severe hoarseness    Fosamax [Alendronate Sodium] Other (See Comments)    myalgias   Levaquin [Levofloxacin] Other (See Comments)    Lightheadedness, not feeling well, ear pain    Doxycycline Hyclate    Infliximab Other (See Comments)    Skin reaction   Spiriva Handihaler [Tiotropium Bromide Monohydrate]     UNSPECIFIED REACTION    Verapamil    Tape Rash    PAPER TAPE: Causes severe rash and weeping    Medications Reviewed Today     Reviewed by Edythe Clarity, [facility] (Pharmacist) on 11/15/21 at 1412  Med List Status: <None>   Medication Order Taking? Sig Documenting Provider Last Dose Status Informant  albuterol (PROAIR HFA) 108 (90 Base) MCG/ACT inhaler [id number] Yes Inhale two puffs every 4-6 hours if needed for cough or wheeze. Kozlow, Donnamarie Poag, MD Taking Active Self  amLODipine (NORVASC) 5 MG tablet [id number] Yes  [provider] Taking Active   aspirin EC 81 MG tablet [id number] Yes Take 1 tablet (81 mg total) by mouth daily. Swallow whole. Costella, Vista Mink, PA-C Taking Active   azelastine (ASTELIN) 0.1 % nasal spray [id number] Yes Place 1 spray into both nostrils 2 (two) times daily. Use in each nostril as directed [provider] Taking Active Self  Bempedoic Acid (NEXLETOL) 180 MG TABS [id number] Yes Take 1 Dose by mouth daily. Pixie Casino, MD Taking Active   budesonide (PULMICORT) 1 MG/2ML nebulizer solution [id number] Yes  [provider] Taking Active   Calcium Carb-Cholecalciferol (CALCIUM 600/VITAMIN D3 PO) [id number] Yes Take 1 tablet by mouth daily. [provider] Taking Active Self  carboxymethylcellulose (REFRESH PLUS) 0.5 % SOLN [id number] Yes Place 1 drop into both eyes 3 (three) times daily as needed (dry eyes).  [provider] Taking Active Self  Cetirizine HCl (ZYRTEC ALLERGY) 10 MG CAPS [id number] Yes Zyrtec 10 mg capsule [provider] Taking Active   Cholecalciferol 125 MCG (5000 UT) TABS [id number] Yes Vitamin D3 125 mcg (5,000 unit) tablet  Take 1 tablet every day by oral route. [provider] Taking Active   denosumab (PROLIA) 60 MG/ML SOSY injection [id number] Yes Inject 60  mg into the skin every 6 (six) months. Courier to rheum: [address] [place], [place] [address]. Appt on 10/28/21 Cassandria Anger, RPH-CPP Taking Active   diphenhydrAMINE (BENADRYL) 25 MG tablet [id number] Yes Take 50 mg by mouth daily as needed for allergies. [provider] Taking Active Self  docusate sodium (COLACE) 100 MG capsule [id number] Yes  [provider] Taking Active   escitalopram (LEXAPRO) 20 MG tablet [id number] Yes Take 1 tablet (20 mg total) by mouth daily. Inda Coke, [place] Taking Active   Evolocumab [facility] SURECLICK) 544 MG/ML Darden Palmer [id number] Yes Inject 1 Dose into the skin every 14 (fourteen) days. Pixie Casino, MD Taking Active   ezetimibe (ZETIA) 10 MG tablet [id number] Yes TAKE ONE TABLET BY MOUTH DAILY Inda Coke, PA Taking Active   famotidine (PEPCID) 40 MG tablet [id number] Yes TAKE 1 TABLET (40 MG TOTAL) BY MOUTH AT BEDTIME. Kozlow, Donnamarie Poag, MD Taking Active Self  fluticasone (FLOVENT HFA) 220 MCG/ACT inhaler [id number] Yes Inhale 2 puffs into the lungs 2 (two) times daily. [provider] Taking Active Self  furosemide (LASIX) 20 MG tablet [id number] Yes Take 1 tablet (20 mg total) by mouth daily. Inda Coke, [place] Taking Active   gabapentin (NEURONTIN) 300 MG capsule [id number]  Yes Take 1 capsule (300 mg total) by mouth at bedtime. Inda Coke, [place] Taking Active   guaiFENesin (MUCINEX) 600 MG 12 hr tablet [id number] Yes Take 600 mg by mouth 2 (two) times daily as needed for cough or to loosen phlegm. [provider] Taking Active Self  HYDROcodone-acetaminophen (NORCO) 10-325 MG tablet [id number] Yes 1 tablet as needed [provider] Taking Active   indomethacin (INDOCIN SR) 75 MG CR capsule [id number] Yes TAKE ONE CAPSULE BY MOUTH DAILY WITH BREAKFAST [place], [place], [place] Taking Active   ipratropium (ATROVENT) 0.03 % nasal spray [id number] Yes Place 1 spray into both nostrils 2 (two) times daily as needed for  rhinitis. [provider] Taking Active Self  irbesartan (AVAPRO) 300 MG tablet [id number] Yes TAKE ONE TABLET BY MOUTH DAILY Inda Coke, PA Taking Active   leucovorin (WELLCOVORIN) 5 MG tablet [id number] Yes Take 1 tablet (5 mg total) by mouth daily. Collier Salina, MD Taking Active   Lifitegrast Shirley Friar) 5 % Bailey Mech [id number] Yes Place 1 drop into both eyes in the morning and at bedtime.  [provider] Taking Active Self  methocarbamol (ROBAXIN) 500 MG tablet [id number] Yes Take 1 tablet (500 mg total) by mouth every 6 (six) hours as needed for muscle spasms. Traci Sermon, PA-C Taking Active   methotrexate 50 MG/2ML injection [id number] Yes Inject 0.6 mLs (15 mg total) into the skin once a week. Collier Salina, MD Taking Active   montelukast (SINGULAIR) 10 MG tablet [id number] Yes Take 1 tablet (10 mg total) by mouth at bedtime. Jiles Prows, MD Taking Active Self  Multiple Vitamin (MULTIVITAMIN) capsule [id number] Yes Take 1 capsule by mouth daily. [provider] Taking Active Self           Med Note Olena Heckle, MICHELE   Wed Nov 30, 2016 10:38 AM)    Needles & Syringes (1CC TB SYRINGE) [place] [id number] Yes 27g 1/2" [provider] Taking Active   omeprazole (PRILOSEC) 40 MG capsule [id number] Yes TAKE ONE CAPSULE BY MOUTH DAILY BEFORE BREAKFAST DAILY  Patient taking differently: Take 40 mg by mouth daily. TAKE ONE CAPSULE BY MOUTH DAILY BEFORE BREAKFAST DAILY   Kozlow, Donnamarie Poag, MD Taking Active Self  OVER THE COUNTER MEDICATION [id number] Yes Place 1 application into both eyes at bedtime. [place] overnight eye ointment [provider] Taking Active   polyethylene glycol powder (GLYCOLAX/MIRALAX) 17 GM/SCOOP powder [id number] Yes Miralax 17 gram/dose oral powder [provider] Taking Active   predniSONE (DELTASONE) 5 MG tablet [id number] Yes Take 1 tablet (5 mg total) by mouth daily with breakfast. Collier Salina, MD Taking  Active   PRESCRIPTION MEDICATION [id number] Yes Place 1 spray into the nose in the morning and at bedtime. Budesonide 0.5 mg / 2 mL Nasal rinse [provider] Taking Active Self  pseudoephedrine (SUDAFED) 30 MG tablet [id number] Yes Take 30 mg by mouth every 4 (four) hours as needed for congestion. [provider] Taking Active Self  Tocilizumab (ACTEMRA) 162 MG/0.9ML SOSY [id number] Yes Inject 0.9 mLs (162 mg total) into the skin every 14 (fourteen) days. Collier Salina, MD Taking Active   traZODone (DESYREL) 150 MG tablet [id number] Yes TAKE ONE TABLET BY MOUTH DAILY Inda Coke, PA Taking Active   TUBERCULIN SYR 1CC/27GX1/2" (B-D TB SYRINGE 1CC/27GX1/2") 27G X 1/2" 1 ML MISC [id number] Yes 12 Syringes by Does not apply route once a week. Collier Salina, MD Taking Active   valACYclovir (VALTREX) 1000 MG tablet  [id number] Yes Take 1,000 mg by mouth daily.  [provider] Taking Active Self  vitamin C (ASCORBIC ACID) 500 MG tablet [id number] Yes Take 500 mg by mouth daily. [provider] Taking Active Self            Patient Active Problem List   Diagnosis Date Noted   High risk medication use 09/10/2021   Posterior vitreous detachment of left eye 09/29/2020   Thoracic spondylosis with cord compression 05/29/2020   Heterotopic ossification 03/30/2020   Loosening of hardware in spine ([place]) 01/27/2020   Posterior vitreous detachment of right eye 01/21/2020   Pseudophakia 01/21/2020   Horseshoe retinal tear of right eye 01/21/2020   Body mass index (BMI) 28.0-28.9, adult 11/27/2019   Left knee OA 11/26/2019   Numbness of foot 02/07/2019   Rheumatoid arthritis ([place]) 01/25/2019   Peripheral polyneuropathy 01/14/2019   Spinal stenosis, thoracolumbar region 01/14/2019   Thoracogenic scoliosis, thoracolumbar region 01/14/2019   Idiopathic scoliosis of thoracolumbar region 02/15/2018   Rhinitis, chronic 05/16/2017   Moderate persistent asthma  without complication 01/65/5374   Polymyalgia rheumatica ([place]) 02/15/2015   DJD (degenerative joint disease) 04/15/2014   Lumbar spine scoliosis 11/28/2013   Degenerative spondylolisthesis 10/09/2013   Lumbar radiculopathy 09/16/2013   Lumbosacral radiculitis 07/31/2013   Scoliosis, or kyphoscoliosis, idiopathic 07/31/2013   Lumbar back pain 12/19/2011   Hyperlipidemia 10/16/2010   Depression 10/16/2010   Lumbar spondylosis 10/16/2010   Gastroesophageal reflux disease 10/16/2010   Adenomatous polyp of colon 10/16/2010   History of paroxysmal supraventricular tachycardia 10/16/2010   Essential (primary) hypertension 10/16/2010    Immunization History  Administered Date(s) Administered   Influenza Split 01/31/2012   Influenza Whole 02/16/2010   Influenza, High Dose Seasonal PF 12/13/2018   Influenza,inj,Quad PF,6+ Mos 01/14/2014, 01/27/2015, 02/02/2016   Influenza,inj,quad, With Preservative 02/26/2020   Influenza-Unspecified 02/24/2021   PFIZER(Purple Top)SARS-COV-2 Vaccination 05/25/2019, 06/15/2019, 02/05/2020, 08/03/2020   Pneumococcal Conjugate-13 01/27/2015   Pneumococcal Polysaccharide-23 05/26/2017   Tdap 10/04/2011   Zoster Recombinat (Shingrix) 10/13/2017, 01/02/2018    Conditions to be addressed/monitored:  HTN, Asthma, RA, HLD, Depression  Care Plan : General Pharmacy (Adult)  Updates made by Edythe Clarity, RPH since 11/15/2021 12:00 AM     Problem: HTN, Asthma, RA, HLD, Depression   Priority: High  Onset Date: 08/11/2021     Long-Range Goal: Patient-Specific Goal   Start Date: 08/11/2021  Expected End Date: 02/10/2022  Recent Progress: On track  Priority: High  Note:   Current Barriers:  Lipids above goal  Pharmacist Clinical Goal(s):  Patient will achieve improvement in BP as evidenced by monitoring through collaboration with PharmD and provider.   Interventions: 1:1 collaboration with Inda Coke, PA regarding development and update of  comprehensive plan of care as evidenced by provider attestation and co-signature Inter-disciplinary care team collaboration (see longitudinal plan of care) Comprehensive medication review performed; medication list updated in electronic medical record  Hypertension (BP goal <130/80) 11/15/21 Controlled, based on reported home readings -Current treatment: Amlodipine 35m daily Appropriate, Effective, Safe, Accessible Irbesartan 3082mdaily Appropriate, Effective, Safe, Accessible Furosemide 203mppropriate, Effective, Safe, Accessible -Medications previously tried: losartan/HCTZ, verapamil, olmesartan  -Current home readings: 127/75, 129/85, 121/81, 116/76, 107/65 -Denies hypotensive/hypertensive symptoms -Educated on BP goals and benefits of medications for prevention of heart attack, stroke and kidney damage; Daily salt intake goal < 2300 mg; Exercise goal of 150 minutes per week; Importance of home blood pressure monitoring; Symptoms of hypotension and importance of maintaining adequate hydration; -She is  monitoring her BP at home, readings look improved and controlled.  Recently reduced her lasix dose due to feeling of dehydration.  This has improved and BP/fluid status has remained controlled. Continues to follow Dr. Debara Pickett for lipids. No need for changes at this time.  Hyperlipidemia: (LDL goal < 100, initially) 11/15/21 -Uncontrolled, but improving -Current treatment: Nexletol 173m Appropriate, Effective, Safe, Accessible Repatha 1412mevery 14 days Appropriate, Effective, Safe, Accessible Zetia 1036mppropriate, Effective, Safe, Accessible -Medications previously tried: statins  -Current dietary patterns: she likes to eat healthy, "it depends on who is cooking" -Current exercise habits: minimal -Educated on Cholesterol goals;  Importance of limiting foods high in cholesterol; Exercise goal of 150 minutes per week; -LDL was 114 at last visit with Dr. HilDebara Pickettimproved from >  200 at last visit.  Still room to go to get to goal. She wishes to reduce pill burden and discussed coming off her ASA.  Defer to Dr. HilDebara Pickettrdiology on this.  Due to lipids and family history may still benefit from primary prevention.  Continue to work on lifestyle and remain adherent to current medication regimen. No changes, FU lipids in 1 year.  Asthma (Goal: control symptoms and prevent exacerbations) -Controlled, not assessed today -Current treatment  Flovent HFA 220 mg BID Appropriate, Effective, Safe, Accessible Albuterol HFA 90 mcg prn Appropriate, Effective, Safe, Accessible Montelukast 44m15mpropriate, Effective, Safe, Accessible -Medications previously tried: none noted  -Pulmonary function testing: Pulmonary Functions Testing Results:  No results found for: FEV1, FVC, FEV1FVC, TLC, DLCO -Exacerbations requiring treatment in last 6 months: none -Patient denies consistent use of maintenance inhaler -Frequency of rescue inhaler use: once or twice per week -Counseled on Proper inhaler technique; Benefits of consistent maintenance inhaler use When to use rescue inhaler Differences between maintenance and rescue inhalers -Recommended to continue current medication  Depression (Goal: Minimize symptoms) -Controlled,  not assessed today -Current treatment: Escitalopram 20mg71mly Appropriate, Query effective, ,  -Medications previously tried/failed: none noted -PHQ9:     07/08/2021    1:32 PM 01/22/2021    2:26 PM 07/22/2020    2:49 PM  PHQ9 SCORE ONLY  PHQ-9 Total Score 0 7 8  -Mood has improved since increasing the dose to 20mg.64munseled on giving the full 3-4 weeks to see full benefit. -Educated on Benefits of medication for symptom control -Recommended to continue current medication  RA/OA (Goal: Reduce symptoms) -Controlled,  not assessed today -Current treatment  Orencia 250mg e54m 30 days Appropriate, Query effective, ,  Leucovorin 5mg dai4mAppropriate,  Effective, Safe, Accessible Methotrexate 15mg Inj74mon Appropriate, Effective, Safe, Accessible -Medications previously tried: none noted -Not seeing much benefit from  Orencia. [place] discuss d/c with MD at upcoming visit. -Recommended to continue current medication Discuss d/c ineffective therapies at upcoming visits.  Patient Goals/Self-Care Activities Patient will:  - take medications as prescribed as evidenced by patient report and record review check blood pressure a few times per week, document, and provide at future appointments target a minimum of 150 minutes of moderate intensity exercise weekly  Follow Up Plan: The care management team will reach out to the patient again over the next 180  days.            Medication Assistance: None required.  Patient affirms current coverage meets needs.  Compliance/Adherence/Medication fill history: Care Gaps: None  Star-Rating Drugs: Irbesartan 300 mg last filled 06/062023 90 DS  Patient's [facility] pharmacy [facility]:  [facility]  [place] [address] Phone: [phone] Fax: [place], [place]. [place] [address] Phone: [phone] Fax: [place] [address] Phone: [phone] Fax: [phone]  Uses pill box? Yes Pt endorses 100% compliance  We discussed: Benefits of medication synchronization, packaging and delivery as well as enhanced pharmacist oversight with [employer]. Patient decided to: Continue current medication management strategy  Care Plan and Follow Up Patient Decision:  Patient agrees to Care Plan and Follow-up.  Plan: The care management team will reach out to the patient again over the next 90 days.  Beverly Milch, PharmD Clinical Pharmacist  [facility] [phone]

## 2021-11-10 LAB — HM DEXA SCAN

## 2021-11-15 ENCOUNTER — Ambulatory Visit (INDEPENDENT_AMBULATORY_CARE_PROVIDER_SITE_OTHER): Payer: Medicare Other | Admitting: Pharmacist

## 2021-11-15 DIAGNOSIS — E782 Mixed hyperlipidemia: Secondary | ICD-10-CM

## 2021-11-15 DIAGNOSIS — I10 Essential (primary) hypertension: Secondary | ICD-10-CM

## 2021-11-15 NOTE — Patient Instructions (Addendum)
Visit Information   Goals Addressed             This Visit's Progress    Track and Manage My Blood Pressure-Hypertension   On track    Timeframe:  Long-Range Goal Priority:  High Start Date:  08/11/21                           Expected End Date:  02/10/22                     Follow Up Date 11/10/21    - check blood pressure weekly - choose a place to take my blood pressure (home, clinic or office, retail store) - write blood pressure results in a log or diary    Why is this important?   You won't feel high blood pressure, but it can still hurt your blood vessels.  High blood pressure can cause heart or kidney problems. It can also cause a stroke.  Making lifestyle changes like losing a little weight or eating less salt will help.  Checking your blood pressure at home and at different times of the day can help to control blood pressure.  If the doctor prescribes medicine remember to take it the way the doctor ordered.  Call the office if you cannot afford the medicine or if there are questions about it.     Notes:        Patient Care Plan: General Pharmacy (Adult)     Problem Identified: HTN, Asthma, RA, HLD, Depression   Priority: High  Onset Date: 08/11/2021     Long-Range Goal: Patient-Specific Goal   Start Date: 08/11/2021  Expected End Date: 02/10/2022  Recent Progress: On track  Priority: High  Note:   Current Barriers:  Lipids above goal  Pharmacist Clinical Goal(s):  Patient will achieve improvement in BP as evidenced by monitoring through collaboration with PharmD and provider.   Interventions: 1:1 collaboration with Inda Coke, PA regarding development and update of comprehensive plan of care as evidenced by provider attestation and co-signature Inter-disciplinary care team collaboration (see longitudinal plan of care) Comprehensive medication review performed; medication list updated in electronic medical record  Hypertension (BP goal <130/80)  11/15/21 Controlled, based on reported home readings -Current treatment: Amlodipine '5mg'$  daily Appropriate, Effective, Safe, Accessible Irbesartan '300mg'$  daily Appropriate, Effective, Safe, Accessible Furosemide '20mg'$  Appropriate, Effective, Safe, Accessible -Medications previously tried: losartan/HCTZ, verapamil, olmesartan  -Current home readings: 127/75, 129/85, 121/81, 116/76, 107/65 -Denies hypotensive/hypertensive symptoms -Educated on BP goals and benefits of medications for prevention of heart attack, stroke and kidney damage; Daily salt intake goal < 2300 mg; Exercise goal of 150 minutes per week; Importance of home blood pressure monitoring; Symptoms of hypotension and importance of maintaining adequate hydration; -She is monitoring her BP at home, readings look improved and controlled.  Recently reduced her lasix dose due to feeling of dehydration.  This has improved and BP/fluid status has remained controlled. Continues to follow Dr. Debara Pickett for lipids. No need for changes at this time.  Hyperlipidemia: (LDL goal < 100, initially) 11/15/21 -Uncontrolled, but improving -Current treatment: Nexletol '180mg'$  Appropriate, Effective, Safe, Accessible Repatha '140mg'$  every 14 days Appropriate, Effective, Safe, Accessible Zetia '10mg'$  Appropriate, Effective, Safe, Accessible -Medications previously tried: statins  -Current dietary patterns: she likes to eat healthy, "it depends on who is cooking" -Current exercise habits: minimal -Educated on Cholesterol goals;  Importance of limiting foods high in cholesterol; Exercise goal of 150  minutes per week; -LDL was 114 at last visit with Dr. Debara Pickett - improved from > 200 at last visit.  Still room to go to get to goal. She wishes to reduce pill burden and discussed coming off her ASA.  Defer to Dr. Debara Pickett cardiology on this.  Due to lipids and family history may still benefit from primary prevention.  Continue to work on lifestyle and remain adherent  to current medication regimen. No changes, FU lipids in 1 year.  Asthma (Goal: control symptoms and prevent exacerbations) -Controlled, not assessed today -Current treatment  Flovent HFA 220 mg BID Appropriate, Effective, Safe, Accessible Albuterol HFA 90 mcg prn Appropriate, Effective, Safe, Accessible Montelukast '10mg'$  Appropriate, Effective, Safe, Accessible -Medications previously tried: none noted  -Pulmonary function testing: Pulmonary Functions Testing Results:  No results found for: FEV1, FVC, FEV1FVC, TLC, DLCO -Exacerbations requiring treatment in last 6 months: none -Patient denies consistent use of maintenance inhaler -Frequency of rescue inhaler use: once or twice per week -Counseled on Proper inhaler technique; Benefits of consistent maintenance inhaler use When to use rescue inhaler Differences between maintenance and rescue inhalers -Recommended to continue current medication  Depression (Goal: Minimize symptoms) -Controlled,  not assessed today -Current treatment: Escitalopram '20mg'$  daily Appropriate, Query effective, ,  -Medications previously tried/failed: none noted -PHQ9:     07/08/2021    1:32 PM 01/22/2021    2:26 PM 07/22/2020    2:49 PM  PHQ9 SCORE ONLY  PHQ-9 Total Score 0 7 8  -Mood has improved since increasing the dose to '20mg'$ .  Counseled on giving the full 3-4 weeks to see full benefit. -Educated on Benefits of medication for symptom control -Recommended to continue current medication  RA/OA (Goal: Reduce symptoms) -Controlled,  not assessed today -Current treatment  Orencia '250mg'$  every 30 days Appropriate, Query effective, ,  Leucovorin '5mg'$  daily Appropriate, Effective, Safe, Accessible Methotrexate '15mg'$  Injection Appropriate, Effective, Safe, Accessible -Medications previously tried: none noted -Not seeing much benefit from  [place].  Plans to discuss d/c with MD at upcoming visit. -Recommended to continue current medication Discuss d/c  ineffective therapies at upcoming visits.  Patient Goals/Self-Care Activities Patient will:  - take medications as prescribed as evidenced by patient report and record review check blood pressure a few times per week, document, and provide at future appointments target a minimum of 150 minutes of moderate intensity exercise weekly  Follow Up Plan: The care management team will reach out to the patient again over the next 180  days.           The patient verbalized understanding of instructions, educational materials, and care plan provided today and DECLINED offer to receive copy of patient instructions, educational materials, and care plan.  Telephone follow up appointment with pharmacy team member scheduled for: 6 months  Edythe Clarity, [place], PharmD Clinical Pharmacist  [facility] [phone]

## 2021-11-17 ENCOUNTER — Encounter: Payer: Self-pay | Admitting: Physician Assistant

## 2021-11-19 ENCOUNTER — Encounter: Payer: Self-pay | Admitting: Physician Assistant

## 2021-11-22 ENCOUNTER — Other Ambulatory Visit: Payer: Self-pay | Admitting: Neurological Surgery

## 2021-11-22 DIAGNOSIS — M4135 Thoracogenic scoliosis, thoracolumbar region: Secondary | ICD-10-CM

## 2021-12-08 NOTE — Progress Notes (Signed)
Office Visit Note  Patient: Vanessa Harmon             Date of Birth: [birth year]           MRN: [id number]             PCP: Inda Coke, PA Referring: Inda Coke, PA Visit Date: 12/14/2021   Subjective:  Follow-up (Started Actemra 3 mos ago.)   History of Present Illness: Vanessa Harmon is a 71 y.o. female here for follow up for seropositive RA on methotrexate 15 mg subcu weekly prednisone 5 mg daily leucovorin 1 mg daily and after starting Actemra 162 mg Levasy q. 14 days.  She feels like arthritis symptoms have been well controlled on her current medications.  No significant flareups.  She still gets swelling in her wrist but no other visible joint swelling.  She has had pretty extensive bruising and easy skin tearing on both arms often from playing with her dog.  Lab tests checked in July did show appears to be a small decrease in estimated GFR.  Previous HPI 09/10/2021 Vanessa Harmon is a 71 y.o. female here for evaluation of chronic back pain associated with seropositive RA she has been seeing [name] on treatment with orencia IV, MTX 15 mg [place] weekly, leucovorin 1 mg daily, and prednisone 5 mg daily.  She was originally diagnosed with inflammatory arthritis by Dr. [name] about a decade ago.  Apparently the worst initial symptom at that time was left hip pain and inflammation with concerning findings on MRI.  Was started on methotrexate as a steroid sparing DMARD treatment.  Apparently had initial concern for seronegative rheumatoid arthritis versus PMR presentation.  She did not tolerate methotrexate well initially worst side effect was substantial hair loss.  Also did not tolerate well on leflunomide.  She was started on infliximab treatment developed a significant adverse reaction with new skin rashes in multiple areas and severe finger and toe nail changes that lasted about a year after discontinuing the medication.  She was started on IV Orencia monthly infusions  which has been beneficial for symptoms but still notices some amount ongoing and that symptoms tend to worsen between each dosing interval.  She was restarted on methotrexate this time with leucovorin in the past few months and this has improved her joint inflammation further.  She remains on low-dose prednisone 5 mg daily throughout most of this time.  Most recent definite flareup of her rheumatoid arthritis was about 6 months ago. In addition to inflammatory joint disease she has significant amount of generalized osteoarthritis.  She reports bone-on-bone degenerative changes with slightly impaired range of motion in the left shoulder.  She had previous left knee total arthroplasty.  She has degenerative arthritis at multiple levels in the spine with history of repeat lumbar spine surgery.  She has some spinal and foraminal stenosis with radicular symptoms.  She is also had recurrent problems with hip bursitis has been responsive to local steroid injection treatments with Dr. [name].  She takes gabapentin 300 mg at night and as needed Robaxin 500 mg for the ongoing symptoms. She has not had any history with recurrent infections or major side effects on her current regimen.  She does have extensive bruising mostly worst in the past year and worst on extensor surfaces of arms and legs.  She denies any history of abnormal bleeding and blood clots. She has chronic dry eyes and sees Dr. [name] currently uses xiidra and genteal drops  for this.     Activities of Daily Living:  Patient reports morning stiffness for 1 hour.   Patient Reports nocturnal pain.  Difficulty dressing/grooming: Denies Difficulty climbing stairs: Reports Difficulty getting out of chair: Denies Difficulty using hands for taps, buttons, cutlery, and/or writing: Reports   Review of Systems  Constitutional:  Positive for fatigue.  HENT:  Negative for mouth sores and mouth dryness.   Eyes:  Positive for dryness.  Respiratory:  Negative  for shortness of breath.   Cardiovascular:  Negative for chest pain and palpitations.  Gastrointestinal:  Positive for constipation. Negative for blood in stool and diarrhea.  Endocrine: Negative for increased urination.  Genitourinary:  Negative for involuntary urination.  Musculoskeletal:  Positive for joint pain, gait problem, joint pain, myalgias, muscle weakness, morning stiffness, muscle tenderness and myalgias. Negative for joint swelling.  Skin:  Positive for color change. Negative for rash, hair loss and sensitivity to sunlight.  Allergic/Immunologic: Negative for susceptible to infections.  Neurological:  Positive for dizziness. Negative for headaches.  Hematological:  Negative for swollen glands.  Psychiatric/Behavioral:  Positive for depressed mood. Negative for sleep disturbance. The patient is nervous/anxious.     PMFS History:  Patient Active Problem List   Diagnosis Date Noted   High risk medication use 09/10/2021   Posterior vitreous detachment of left eye 09/29/2020   Thoracic spondylosis with cord compression 05/29/2020   Heterotopic ossification 03/30/2020   Loosening of hardware in spine ([place]) 01/27/2020   Posterior vitreous detachment of right eye 01/21/2020   Pseudophakia 01/21/2020   Horseshoe retinal tear of right eye 01/21/2020   Body mass index (BMI) 28.0-28.9, adult 11/27/2019   Left knee OA 11/26/2019   Numbness of foot 02/07/2019   Rheumatoid arthritis ([place]) 01/25/2019   Peripheral polyneuropathy 01/14/2019   Spinal stenosis, thoracolumbar region 01/14/2019   Thoracogenic scoliosis, thoracolumbar region 01/14/2019   Idiopathic scoliosis of thoracolumbar region 02/15/2018   Rhinitis, chronic 05/16/2017   Moderate persistent asthma without complication 02/72/5366   Polymyalgia rheumatica ([place]) 02/15/2015   DJD (degenerative joint disease) 04/15/2014   Lumbar spine scoliosis 11/28/2013   Degenerative spondylolisthesis 10/09/2013   Lumbar radiculopathy  09/16/2013   Lumbosacral radiculitis 07/31/2013   Scoliosis, or kyphoscoliosis, idiopathic 07/31/2013   Lumbar back pain 12/19/2011   Hyperlipidemia 10/16/2010   Depression 10/16/2010   Lumbar spondylosis 10/16/2010   Gastroesophageal reflux disease 10/16/2010   Adenomatous polyp of colon 10/16/2010   History of paroxysmal supraventricular tachycardia 10/16/2010   Essential (primary) hypertension 10/16/2010    Past Medical History:  Diagnosis Date   Adenomatous polyp    Allergy    Anxiety    takes Ativan daily as needed   Arthritis    inflammatory arthritis (Dr. Dagoberto Ligas)   Asthma    Cataract    Chronic back pain    scoliosis    Constipation    takes miralax every other day   Depression    takes Lexapro daily   Eczema    Essential hypertension, benign    takes Hyzaar and Metoprolol daily   Family history of adverse reaction to anesthesia    Sister PONV   Fibromyalgia    GERD (gastroesophageal reflux disease)    History of colon polyps    History of shingles    Hyperlipidemia    takes Atorvastatin daily   Insomnia    takes Melatonin nightly   Joint pain    Osteoarthritis    Osteoporosis    PSVT (paroxysmal supraventricular  tachycardia) (HCC)    Recurrent upper respiratory infection (URI)    Rheumatoid arthritis ([place])    sees Dr. Amil Amen; stable on Orencia   Rhinitis, allergic    uses Flonase daily   Urinary urgency    Urticaria     Family History  Problem Relation Age of Onset   Atrial fibrillation Mother    Stroke Mother    Allergic rhinitis Mother    Allergic rhinitis Father    AAA (abdominal aortic aneurysm) Father    COPD Father    Prostate cancer Father    Atrial fibrillation Brother    Hypertension Other        unspecified grandmother   AAA (abdominal aortic aneurysm) Other        unspecified grandfather   Cancer Other        unspecified grandfather   Neuropathy Neg Hx    Past Surgical History:  Procedure Laterality Date    ADENOIDECTOMY     APPENDECTOMY     BACK SURGERY  2012   at age 2 d/t scoliosis   BLEPHAROPLASTY  2018   BREAST BIOPSY     cataract surgery     COLONOSCOPY     HARDWARE REMOVAL Left 02/24/2020   Procedure: HARDWARE REMOVAL LEFT DISTAL RADIUS;  Surgeon: Leanora Cover, MD;  Location: [facility];  Service: Orthopedics;  Laterality: Left;   KNEE ARTHROSCOPY Bilateral    neuroma removed from foot     unsure of which foot   OPEN REDUCTION INTERNAL FIXATION (ORIF) DISTAL RADIAL FRACTURE Left 08/15/2019   Procedure: OPEN REDUCTION INTERNAL FIXATION (ORIF) DISTAL RADIAL FRACTURE;  Surgeon: Leanora Cover, MD;  Location: [facility];  Service: Orthopedics;  Laterality: Left;  block in preop   POSTERIOR CERVICAL FUSION/FORAMINOTOMY N/A 05/29/2020   Procedure: Removal of hardware, Replacement of thoracic 8, thoracic 9, thoracic 10 screws, Right Thoracic 6-7, Right Thoracic 7-8 Laminectomies with thoracic hooks from Thoracic 4-5 to Thoracic 10;  Surgeon: Erline Levine, MD;  Location: [facility];  Service: Neurosurgery;  Laterality: N/A;   POSTERIOR LUMBAR FUSION 4 LEVEL N/A 02/15/2018   Procedure: Decompression and fusion Thoracic nine to Lumbar two with exploration of previous fusion;  Surgeon: Erline Levine, MD;  Location: [facility];  Service: Neurosurgery;  Laterality: N/A;   SHOULDER SURGERY Right    TONSILLECTOMY     TOTAL HIP ARTHROPLASTY Right 04/15/2014   Procedure: RIGHT TOTAL HIP ARTHROPLASTY ANTERIOR APPROACH;  Surgeon: Renette Butters, MD;  Location: [place] [facility];  Service: Orthopedics;  Laterality: Right;   TOTAL KNEE ARTHROPLASTY Left 11/26/2019   Procedure: TOTAL KNEE ARTHROPLASTY;  Surgeon: Paralee Cancel, MD;  Location: [facility];  Service: Orthopedics;  Laterality: Left;  70 mins   Social History   Social History Narrative   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   Lives with husband   Immunization History  Administered Date(s) Administered   Influenza Split 01/31/2012   Influenza  Whole 02/16/2010   Influenza, High Dose Seasonal PF 12/13/2018   Influenza,inj,Quad PF,6+ Mos 01/14/2014, 01/27/2015, 02/02/2016   Influenza,inj,quad, With Preservative 02/26/2020   Influenza-Unspecified 02/24/2021   PFIZER(Purple Top)SARS-COV-2 Vaccination 05/25/2019, 06/15/2019, 02/05/2020, 08/03/2020   Pneumococcal Conjugate-13 01/27/2015   Pneumococcal Polysaccharide-23 05/26/2017   Tdap 10/04/2011   Zoster Recombinat (Shingrix) 10/13/2017, 01/02/2018     Objective: Vital Signs: BP (!) 145/73 (BP Location: Right Arm, Patient Position: Sitting, Cuff Size: Large)   Pulse (!) 57   Resp 14   Ht '5\' 6"'$  (1.676  m)   Wt 177 lb 9.6 oz (80.6 kg)   BMI 28.67 kg/m    Physical Exam Cardiovascular:     Rate and Rhythm: Normal rate and regular rhythm.  Pulmonary:     Effort: Pulmonary effort is normal.     Breath sounds: Normal breath sounds.  Skin:    General: Skin is warm and dry.     Findings: Bruising present.     Comments: Numerous bruises and small skin tears throughout extensor surfaces of both forearms and backs of both hands  Neurological:     Mental Status: She is alert.  Psychiatric:        Mood and Affect: Mood normal.      Musculoskeletal Exam:  Shoulders full ROM no tenderness or swelling Elbows full ROM no tenderness or swelling Wrists full ROM no tenderness or swelling, chronic appearing swelling at ulnar side Fingers full ROM, no tenderness or swelling, heberdon's nodes of both hands and DIPs lateral deviations Knees full ROM right knee patellofemoral crepitus, no palpable swelling Ankles full ROM no tenderness or swelling  CDAI Exam: CDAI Score: 3  Patient Global: 20 mm; Provider Global: 10 mm Swollen: 0 ; Tender: 0  Joint Exam 12/14/2021   All documented joints were normal     Investigation: No additional findings.  Imaging: No results found.  Recent Labs: Lab Results  Component Value Date   WBC 9.1 10/18/2021   HGB 12.9 10/18/2021   PLT 282  10/18/2021   NA 138 10/25/2021   K 4.5 10/25/2021   CL 99 10/25/2021   CO2 30 10/25/2021   GLUCOSE 123 (H) 10/25/2021   BUN 24 (H) 10/25/2021   CREATININE 1.19 10/25/2021   BILITOT 0.9 10/25/2021   ALKPHOS 30 (L) 10/25/2021   AST 25 10/25/2021   ALT 25 10/25/2021   PROT 6.5 10/25/2021   ALBUMIN 4.7 10/25/2021   CALCIUM 10.4 10/25/2021   GFRAA >60 11/27/2019   QFTBGOLDPLUS NEGATIVE 09/10/2021    Speciality Comments: No specialty comments available.  Procedures:  No procedures performed Allergies: Advair hfa [fluticasone-salmeterol], Statins, Breo ellipta [fluticasone furoate-vilanterol], Fosamax [alendronate sodium], Levaquin [levofloxacin], Doxycycline hyclate, Infliximab, Spiriva handihaler [tiotropium bromide monohydrate], Verapamil, and Tape   Assessment / Plan:     Visit Diagnoses: Rheumatoid arthritis involving vertebra with positive rheumatoid factor ([place]) - Plan: Sedimentation rate  Appears to be in low disease activity checking sedimentation rate for RA activity monitoring.  Peripheral swollen or tender joints are resolved compared to last visit.  There appear to be mild decrease in estimated GFR on labs from July concerning with the methotrexate dose.  If repeat labs today are similar recommend beginning to taper down this dose and continue just the Actemra 162 mg q. 14 days and prednisone 5 mg daily.  High risk medication use -  methotrexate 15 mg subcu weekly leucovorin 1 mg daily and prednisone 5 mg daily  - Plan: CBC with Differential/Platelet, COMPLETE METABOLIC PANEL WITH GFR  Checking CBC and CMP for methotrexate and Actemra medication monitoring.  No injection site reactions no interval infections or other complication.  Polymyalgia rheumatica (HCC) Lumbosacral radiculitis  Back and girdle muscle pain symptoms doing better today not sure if this is related to some underlying PMR activity decrease in RA inflammation or otherwise.  Orders: Orders Placed This  Encounter  Procedures   Sedimentation rate   CBC with Differential/Platelet   COMPLETE METABOLIC PANEL WITH GFR   No orders of the defined types were placed in  this encounter.    Follow-Up Instructions: Return in about 3 months (around 03/16/2022) for RA on MTX/TOC/GC f/u 31mo.   CCollier Salina MD  Note - This record has been created using [employer]  Chart creation errors have been sought, but may not always  have been located. Such creation errors do not reflect on  the standard of medical care.

## 2021-12-14 ENCOUNTER — Encounter: Payer: Self-pay | Admitting: Internal Medicine

## 2021-12-14 ENCOUNTER — Ambulatory Visit: Payer: Medicare Other | Attending: Internal Medicine | Admitting: Internal Medicine

## 2021-12-14 VITALS — BP 145/73 | HR 57 | Resp 14 | Ht 66.0 in | Wt 177.6 lb

## 2021-12-14 DIAGNOSIS — M353 Polymyalgia rheumatica: Secondary | ICD-10-CM | POA: Diagnosis not present

## 2021-12-14 DIAGNOSIS — M459 Ankylosing spondylitis of unspecified sites in spine: Secondary | ICD-10-CM

## 2021-12-14 DIAGNOSIS — M5417 Radiculopathy, lumbosacral region: Secondary | ICD-10-CM

## 2021-12-14 DIAGNOSIS — Z79899 Other long term (current) drug therapy: Secondary | ICD-10-CM | POA: Diagnosis not present

## 2021-12-15 LAB — COMPLETE METABOLIC PANEL WITH GFR
AG Ratio: 2.9 (calc) — ABNORMAL HIGH (ref 1.0–2.5)
ALT: 20 U/L (ref 6–29)
AST: 19 U/L (ref 10–35)
Albumin: 4.7 g/dL (ref 3.6–5.1)
Alkaline phosphatase (APISO): 36 U/L — ABNORMAL LOW (ref 37–153)
BUN/Creatinine Ratio: 17 (calc) (ref 6–22)
BUN: 20 mg/dL (ref 7–25)
CO2: 32 mmol/L (ref 20–32)
Calcium: 10.4 mg/dL (ref 8.6–10.4)
Chloride: 103 mmol/L (ref 98–110)
Creat: 1.19 mg/dL — ABNORMAL HIGH (ref 0.60–1.00)
Globulin: 1.6 g/dL (calc) — ABNORMAL LOW (ref 1.9–3.7)
Glucose, Bld: 126 mg/dL — ABNORMAL HIGH (ref 65–99)
Potassium: 4.7 mmol/L (ref 3.5–5.3)
Sodium: 142 mmol/L (ref 135–146)
Total Bilirubin: 0.5 mg/dL (ref 0.2–1.2)
Total Protein: 6.3 g/dL (ref 6.1–8.1)
eGFR: 49 mL/min/{1.73_m2} — ABNORMAL LOW (ref >=60)

## 2021-12-15 LAB — CBC WITH DIFFERENTIAL/PLATELET
Absolute Monocytes: 621 cells/uL (ref 200–950)
Basophils Absolute: 41 cells/uL (ref 0–200)
Basophils Relative: 0.6 %
Eosinophils Absolute: 69 cells/uL (ref 15–500)
Eosinophils Relative: 1 %
HCT: 38 % (ref 35.0–45.0)
Hemoglobin: 13.1 g/dL (ref 11.7–15.5)
Lymphs Abs: 635 cells/uL — ABNORMAL LOW (ref 850–3900)
MCH: 36.6 pg — ABNORMAL HIGH (ref 27.0–33.0)
MCHC: 34.5 g/dL (ref 32.0–36.0)
MCV: 106.1 fL — ABNORMAL HIGH (ref 80.0–100.0)
MPV: 11.5 fL (ref 7.5–12.5)
Monocytes Relative: 9 %
Neutro Abs: 5534 cells/uL (ref 1500–7800)
Neutrophils Relative %: 80.2 %
Platelets: 242 10*3/uL (ref 140–400)
RBC: 3.58 10*6/uL — ABNORMAL LOW (ref 3.80–5.10)
RDW: 13.7 % (ref 11.0–15.0)
Total Lymphocyte: 9.2 %
WBC: 6.9 10*3/uL (ref 3.8–10.8)

## 2021-12-15 LAB — SEDIMENTATION RATE: Sed Rate: 2 mm/h (ref 0–30)

## 2021-12-15 MED ORDER — ACTEMRA 162 MG/0.9ML ~~LOC~~ SOSY: 162.0000 mg | PREFILLED_SYRINGE | SUBCUTANEOUS | 0 refills | Status: DC

## 2021-12-15 NOTE — Addendum Note (Signed)
Addended by: Collier Salina on: 12/15/2021 03:52 PM   Modules accepted: Orders

## 2021-12-15 NOTE — Progress Notes (Signed)
I spoke with Vanessa Harmon her labs show eGFR 49 similar to last value of 47. Her MCV is up to 106 likely due to methotrexate side effect. Recommend stopping methotrexate now and can stop leucovorin after 2 weeks. Continue Actemra [id number].

## 2021-12-17 ENCOUNTER — Telehealth: Payer: Self-pay | Admitting: Internal Medicine

## 2021-12-17 ENCOUNTER — Ambulatory Visit
Admission: RE | Admit: 2021-12-17 | Discharge: 2021-12-17 | Disposition: A | Discharge status: Home / Self Care | Payer: Medicare Other | Source: Ambulatory Visit | Attending: Neurological Surgery | Admitting: Neurological Surgery

## 2021-12-17 DIAGNOSIS — M4135 Thoracogenic scoliosis, thoracolumbar region: Secondary | ICD-10-CM

## 2021-12-17 MED ORDER — REPATHA SURECLICK 140 MG/ML ~~LOC~~ SOAJ: 1.0000 | SUBCUTANEOUS | 3 refills | Status: DC

## 2021-12-17 NOTE — Telephone Encounter (Signed)
*  STAT* If patient is at the pharmacy, call can be transferred to refill team.   1. Which medications need to be refilled? (please list name of each medication and dose if known)   Evolocumab (REPATHA SURECLICK) 076 MG/ML SOAJ  2. Which pharmacy/location (including street and city if local pharmacy) is medication to be sent to?  [place], [place]  3. Do they need a 30 day or 90 day supply? 90 day   Patient called stating she is completely out of this medication.

## 2021-12-24 ENCOUNTER — Encounter: Payer: Self-pay | Admitting: Internal Medicine

## 2021-12-27 ENCOUNTER — Other Ambulatory Visit: Payer: Self-pay | Admitting: Physician Assistant

## 2021-12-30 ENCOUNTER — Ambulatory Visit
Admission: EM | Admit: 2021-12-30 | Discharge: 2021-12-30 | Disposition: A | Discharge status: Home / Self Care | Payer: Medicare Other | Attending: Emergency Medicine | Admitting: Emergency Medicine

## 2021-12-30 ENCOUNTER — Ambulatory Visit (INDEPENDENT_AMBULATORY_CARE_PROVIDER_SITE_OTHER): Payer: Medicare Other

## 2021-12-30 DIAGNOSIS — L03113 Cellulitis of right upper limb: Secondary | ICD-10-CM

## 2021-12-30 DIAGNOSIS — S8991XA Unspecified injury of right lower leg, initial encounter: Secondary | ICD-10-CM

## 2021-12-30 DIAGNOSIS — M25561 Pain in right knee: Secondary | ICD-10-CM | POA: Diagnosis not present

## 2021-12-30 MED ORDER — AMOXICILLIN-POT CLAVULANATE 875-125 MG PO TABS: 1.0000 | ORAL_TABLET | Freq: Two times a day (BID) | ORAL | 0 refills | Status: AC

## 2021-12-30 NOTE — ED Provider Notes (Signed)
[facility]    CSN: [id number] Arrival date & time: 12/30/21  1551    HISTORY   Chief Complaint  Patient presents with   Arm Pain   Knee Pain   HPI Vanessa Harmon is a pleasant, 71 y.o. female who presents to urgent care today. Patient complains of redness and swelling of multiple lesions on both of her arms incurred over the past few days while playing with her 90 pound Rottweiler.  States right forearm is now diffusely erythematous, edematous, indurated and warm to touch just below her elbow.  Patient states she has been applying warm compresses to her right forearm without meaningful improvement.  Patient also complains of right knee pain and instability, states she ran into her dog last night while playing with her and fell backward causing her right knee to hyperextend.  Patient is wearing a knee brace on arrival today and using a rolling walker for balance.  The history is provided by the patient.   Past Medical History:  Diagnosis Date   Adenomatous polyp    Allergy    Anxiety    takes Ativan daily as needed   Arthritis    inflammatory arthritis (Dr. Dagoberto Ligas)   Asthma    Cataract    Chronic back pain    scoliosis    Constipation    takes miralax every other day   Depression    takes Lexapro daily   Eczema    Essential hypertension, benign    takes Hyzaar and Metoprolol daily   Family history of adverse reaction to anesthesia    Sister PONV   Fibromyalgia    GERD (gastroesophageal reflux disease)    History of colon polyps    History of shingles    Hyperlipidemia    takes Atorvastatin daily   Insomnia    takes Melatonin nightly   Joint pain    Osteoarthritis    Osteoporosis    PSVT (paroxysmal supraventricular tachycardia) (HCC)    Recurrent upper respiratory infection (URI)    Rheumatoid arthritis ([place])    sees Dr. Amil Amen; stable on Orencia   Rhinitis, allergic    uses Flonase daily   Urinary urgency    Urticaria     Patient Active Problem List   Diagnosis Date Noted   High risk medication use 09/10/2021   Posterior vitreous detachment of left eye 09/29/2020   Thoracic spondylosis with cord compression 05/29/2020   Heterotopic ossification 03/30/2020   Loosening of hardware in spine ([place]) 01/27/2020   Posterior vitreous detachment of right eye 01/21/2020   Pseudophakia 01/21/2020   Horseshoe retinal tear of right eye 01/21/2020   Body mass index (BMI) 28.0-28.9, adult 11/27/2019   Left knee OA 11/26/2019   Numbness of foot 02/07/2019   Rheumatoid arthritis ([place]) 01/25/2019   Peripheral polyneuropathy 01/14/2019   Spinal stenosis, thoracolumbar region 01/14/2019   Thoracogenic scoliosis, thoracolumbar region 01/14/2019   Idiopathic scoliosis of thoracolumbar region 02/15/2018   Rhinitis, chronic 05/16/2017   Moderate persistent asthma without complication 30/16/0109   Polymyalgia rheumatica ([place]) 02/15/2015   DJD (degenerative joint disease) 04/15/2014   Lumbar spine scoliosis 11/28/2013   Degenerative spondylolisthesis 10/09/2013   Lumbar radiculopathy 09/16/2013   Lumbosacral radiculitis 07/31/2013   Scoliosis, or kyphoscoliosis, idiopathic 07/31/2013   Lumbar back pain 12/19/2011   Hyperlipidemia 10/16/2010   Depression 10/16/2010   Lumbar spondylosis 10/16/2010   Gastroesophageal reflux disease 10/16/2010   Adenomatous polyp of colon 10/16/2010   History of paroxysmal  supraventricular tachycardia 10/16/2010   Essential (primary) hypertension 10/16/2010   Past Surgical History:  Procedure Laterality Date   ADENOIDECTOMY     APPENDECTOMY     BACK SURGERY  2012   at age 41 d/t scoliosis   BLEPHAROPLASTY  2018   BREAST BIOPSY     cataract surgery     COLONOSCOPY     HARDWARE REMOVAL Left 02/24/2020   Procedure: HARDWARE REMOVAL LEFT DISTAL RADIUS;  Surgeon: Leanora Cover, MD;  Location: [place];  Service: Orthopedics;  Laterality: Left;   KNEE ARTHROSCOPY  Bilateral    neuroma removed from foot     unsure of which foot   OPEN REDUCTION INTERNAL FIXATION (ORIF) DISTAL RADIAL FRACTURE Left 08/15/2019   Procedure: OPEN REDUCTION INTERNAL FIXATION (ORIF) DISTAL RADIAL FRACTURE;  Surgeon: Leanora Cover, MD;  Location: [place];  Service: Orthopedics;  Laterality: Left;  block in preop   POSTERIOR CERVICAL FUSION/FORAMINOTOMY N/A 05/29/2020   Procedure: Removal of hardware, Replacement of thoracic 8, thoracic 9, thoracic 10 screws, Right Thoracic 6-7, Right Thoracic 7-8 Laminectomies with thoracic hooks from Thoracic 4-5 to Thoracic 10;  Surgeon: Erline Levine, MD;  Location: [place];  Service: Neurosurgery;  Laterality: N/A;   POSTERIOR LUMBAR FUSION 4 LEVEL N/A 02/15/2018   Procedure: Decompression and fusion Thoracic nine to Lumbar two with exploration of previous fusion;  Surgeon: Erline Levine, MD;  Location: [place];  Service: Neurosurgery;  Laterality: N/A;   SHOULDER SURGERY Right    TONSILLECTOMY     TOTAL HIP ARTHROPLASTY Right 04/15/2014   Procedure: RIGHT TOTAL HIP ARTHROPLASTY ANTERIOR APPROACH;  Surgeon: Renette Butters, MD;  Location: [place];  Service: Orthopedics;  Laterality: Right;   TOTAL KNEE ARTHROPLASTY Left 11/26/2019   Procedure: TOTAL KNEE ARTHROPLASTY;  Surgeon: Paralee Cancel, MD;  Location: [facility];  Service: Orthopedics;  Laterality: Left;  70 mins   OB History   No obstetric history on file.    Home Medications    Prior to Admission medications   Medication Sig Start Date End Date Taking? Authorizing Provider  albuterol (PROAIR HFA) 108 (90 Base) MCG/ACT inhaler Inhale two puffs every 4-6 hours if needed for cough or wheeze. 03/26/18   Kozlow, Donnamarie Poag, MD  amLODipine (NORVASC) 5 MG tablet     [provider]  aspirin EC 81 MG tablet Take 1 tablet (81 mg total) by mouth daily. Swallow whole. 06/05/20   Costella, Vista Mink, PA-C  azelastine (ASTELIN) 0.1 % nasal spray Place 1 spray into both nostrils 2  (two) times daily. Use in each nostril as directed    [provider]  Bempedoic Acid (NEXLETOL) 180 MG TABS Take 1 Dose by mouth daily. 05/10/21   Hilty, Nadean Corwin, MD  budesonide (PULMICORT) 1 MG/2ML nebulizer solution  08/14/21   [provider]  Calcium Carb-Cholecalciferol (CALCIUM 600/VITAMIN D3 PO) Take 1 tablet by mouth daily.    [provider]  carboxymethylcellulose (REFRESH PLUS) 0.5 % SOLN Place 1 drop into both eyes 3 (three) times daily as needed (dry eyes).     [provider]  Cetirizine HCl (ZYRTEC ALLERGY) 10 MG CAPS Zyrtec 10 mg capsule    [provider]  Cholecalciferol 125 MCG (5000 UT) TABS Vitamin D3 125 mcg (5,000 unit) tablet  Take 1 tablet every day by oral route.    [provider]  denosumab (PROLIA) 60 MG/ML SOSY injection Inject 60 mg into the skin every 6 (six) months.  Courier to rheum: [address], [place], [place] [address]. Appt on 10/28/21 10/15/21   Cassandria Anger, RPH-CPP  diphenhydrAMINE (BENADRYL) 25 MG tablet Take 50 mg by mouth daily as needed for allergies.    [provider]  docusate sodium (COLACE) 100 MG capsule     [provider]  escitalopram (LEXAPRO) 20 MG tablet Take 1 tablet (20 mg total) by mouth daily. 10/25/21   Inda Coke, PA  Evolocumab (REPATHA SURECLICK) 341 MG/ML SOAJ Inject 1 Dose into the skin every 14 (fourteen) days. 12/17/21   Hilty, Nadean Corwin, MD  ezetimibe (ZETIA) 10 MG tablet TAKE ONE TABLET BY MOUTH DAILY 07/01/21   Inda Coke, PA  famotidine (PEPCID) 40 MG tablet TAKE 1 TABLET (40 MG TOTAL) BY MOUTH AT BEDTIME. 11/21/18   Kozlow, Donnamarie Poag, MD  fluticasone (FLOVENT HFA) 220 MCG/ACT inhaler Inhale 2 puffs into the lungs 2 (two) times daily.    [provider]  furosemide (LASIX) 20 MG tablet Take 1 tablet (20 mg total) by mouth daily. 10/25/21   Inda Coke, PA  gabapentin (NEURONTIN) 300 MG capsule Take 1 capsule (300 mg total) by  mouth at bedtime. 10/25/21   Inda Coke, PA  guaiFENesin (MUCINEX) 600 MG 12 hr tablet Take 600 mg by mouth 2 (two) times daily as needed for cough or to loosen phlegm.    [provider]  HYDROcodone-acetaminophen (NORCO) 10-325 MG tablet 1 tablet as needed    [provider]  indomethacin (INDOCIN SR) 75 MG CR capsule TAKE ONE CAPSULE BY MOUTH DAILY WITH BREAKFAST 10/12/21   Inda Coke, PA  ipratropium (ATROVENT) 0.03 % nasal spray Place 1 spray into both nostrils 2 (two) times daily as needed for rhinitis.    [provider]  irbesartan (AVAPRO) 300 MG tablet TAKE ONE TABLET BY MOUTH DAILY 12/27/21   Inda Coke, PA  Lifitegrast Shirley Friar) 5 % SOLN Place 1 drop into both eyes in the morning and at bedtime.     [provider]  methocarbamol (ROBAXIN) 500 MG tablet Take 1 tablet (500 mg total) by mouth every 6 (six) hours as needed for muscle spasms. 05/31/20   Costella, Vista Mink, PA-C  montelukast (SINGULAIR) 10 MG tablet Take 1 tablet (10 mg total) by mouth at bedtime. 06/19/18   Kozlow, Donnamarie Poag, MD  Multiple Vitamin (MULTIVITAMIN) capsule Take 1 capsule by mouth daily.    [provider]  Needles & Syringes (1CC TB SYRINGE) MISC 27g 1/2" 04/22/21   [provider]  omeprazole (PRILOSEC) 40 MG capsule TAKE ONE CAPSULE BY MOUTH DAILY BEFORE BREAKFAST DAILY Patient taking differently: Take 40 mg by mouth daily. TAKE ONE CAPSULE BY MOUTH DAILY BEFORE BREAKFAST DAILY 12/31/18   Kozlow, Donnamarie Poag, MD  OVER THE COUNTER MEDICATION Place 1 application into both eyes at bedtime. Genteal overnight eye ointment    [provider]  polyethylene glycol powder (GLYCOLAX/MIRALAX) 17 GM/SCOOP powder Miralax 17 gram/dose oral powder    [provider]  predniSONE (DELTASONE) 5 MG tablet Take 1 tablet (5 mg total) by mouth daily with breakfast. 09/15/21   Rice, Resa Miner, MD  PRESCRIPTION MEDICATION Place 1 spray into the nose in the  morning and at bedtime. Budesonide 0.5 mg / 2 mL Nasal rinse    [provider]  pseudoephedrine (SUDAFED) 30 MG tablet Take 30 mg by mouth every 4 (four) hours as needed for congestion.    [provider]  Tocilizumab (ACTEMRA) 162  MG/0.9ML SOSY Inject 0.9 mLs (162 mg total) into the skin every 14 (fourteen) days. 12/15/21   Collier Salina, MD  traZODone (DESYREL) 150 MG tablet TAKE ONE TABLET BY MOUTH DAILY 12/27/21   Inda Coke, PA  TUBERCULIN SYR 1CC/27GX1/2" (B-D TB SYRINGE 1CC/27GX1/2") 27G X 1/2" 1 ML MISC 12 Syringes by Does not apply route once a week. 09/15/21   Rice, Resa Miner, MD  valACYclovir (VALTREX) 1000 MG tablet Take 1,000 mg by mouth daily.  09/02/16   [provider]  vitamin C (ASCORBIC ACID) 500 MG tablet Take 500 mg by mouth daily.    [provider]    Family History Family History  Problem Relation Age of Onset   Atrial fibrillation Mother    Stroke Mother    Allergic rhinitis Mother    Allergic rhinitis Father    AAA (abdominal aortic aneurysm) Father    COPD Father    Prostate cancer Father    Atrial fibrillation Brother    Hypertension Other        unspecified grandmother   AAA (abdominal aortic aneurysm) Other        unspecified grandfather   Cancer Other        unspecified grandfather   Neuropathy Neg Hx    Social History Social History   Tobacco Use   Smoking status: Former    Packs/day: 1.00    Years: 20.00    Total pack years: 20.00    Types: Cigarettes    Quit date: 1998    Years since quitting: 25.7   Smokeless tobacco: Never   Tobacco comments:    hasn't smoked in 70yr  Vaping Use   Vaping Use: Never used  Substance Use Topics   Alcohol use: Not Currently    Comment: rarely with dinner   Drug use: No   Allergies   Advair hfa [fluticasone-salmeterol], Statins, Breo ellipta [fluticasone furoate-vilanterol], Fosamax [alendronate sodium], Levaquin [levofloxacin], Doxycycline hyclate,  Infliximab, Spiriva handihaler [tiotropium bromide monohydrate], Verapamil, and Tape  Review of Systems Review of Systems Pertinent findings revealed after performing a 14 point review of systems has been noted in the history of present illness.  Physical Exam Triage Vital Signs ED Triage Vitals  Enc Vitals Group     BP 02/12/21 0827 (!) 147/82     Pulse Rate 02/12/21 0827 72     Resp 02/12/21 0827 18     Temp 02/12/21 0827 98.3 F (36.8 C)     Temp Source 02/12/21 0827 Oral     SpO2 02/12/21 0827 98 %     Weight --      Height --      Head Circumference --      Peak Flow --      Pain Score 02/12/21 0826 5     Pain Loc --      Pain Edu? --      Excl. in [place] --   No data found.  Updated Vital Signs BP (!) 158/85 (BP Location: Left Arm)   Pulse 76   Temp 98 F (36.7 C) (Oral)   Resp 16   SpO2 98%   Physical Exam Vitals and nursing note reviewed.  Constitutional:      General: She is not in acute distress.    Appearance: Normal appearance.  HENT:     Head: Normocephalic and atraumatic.  Eyes:     Pupils: Pupils are equal, round, and reactive to light.  Cardiovascular:  Rate and Rhythm: Normal rate and regular rhythm.  Pulmonary:     Effort: Pulmonary effort is normal.     Breath sounds: Normal breath sounds.  Musculoskeletal:        General: Normal range of motion.     Cervical back: Normal range of motion and neck supple.     Right knee: Swelling present. No deformity, effusion, erythema, ecchymosis, lacerations, bony tenderness or crepitus. Normal range of motion. Tenderness present over the PCL. No medial joint line, lateral joint line, MCL, LCL, ACL or patellar tendon tenderness. No LCL laxity, MCL laxity, ACL laxity or PCL laxity. Normal alignment, normal meniscus and normal patellar mobility. Normal pulse.     Instability Tests: Anterior drawer test negative. Posterior drawer test negative.  Skin:    General: Skin is warm and dry.     Findings: Erythema  (Erythema accompanied by edema, induration, warmth and tenderness to palpation on medial aspect of right forearm just distal to the elbow, no origin of infection or fluctuance appreciated) and lesion (Multiple abrasions and small lacerations of her stages of healing covered in DuoDERM bandaging on both forearms.) present.  Neurological:     General: No focal deficit present.     Mental Status: She is alert and oriented to person, place, and time. Mental status is at baseline.  Psychiatric:        Mood and Affect: Mood normal.        Behavior: Behavior normal.        Thought Content: Thought content normal.        Judgment: Judgment normal.     Visual Acuity Right Eye Distance:   Left Eye Distance:   Bilateral Distance:    Right Eye Near:   Left Eye Near:    Bilateral Near:     [facility] / Diagnostics / Procedures:     Radiology DG Knee Complete 4 Views Right  Result Date: 12/30/2021 CLINICAL DATA:  Hyperextension injury of right knee last night. Pain with weight-bearing and swelling today. Dog ran into right knee and patient fell backward. EXAM: RIGHT KNEE - COMPLETE 4+ VIEW COMPARISON:  None Available. FINDINGS: Mild medial coronoid joint space narrowing with mild-to-moderate peripheral degenerative osteophytosis. Mild-to-moderate patellofemoral joint space narrowing and peripheral osteophytosis. Small joint effusion. No acute fracture or dislocation. IMPRESSION: 1. Small joint effusion. 2. No acute fracture. 3. 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 Electronically Signed   By: Yvonne Kendall M.D.   On: 12/30/2021 17:01    Procedures Procedures (including critical care time) EKG  Pending results:  Labs Reviewed - No data to display  Medications Ordered in UC: Medications - No data to display  UC Diagnoses / Final Clinical Impressions(s)   I have reviewed the triage vital signs and the nursing notes.  Pertinent labs & imaging results  that were available during my care of the patient were reviewed by me and considered in my medical decision making (see chart for details).    Final diagnoses:  Cellulitis of right upper extremity  Injury of right knee, initial encounter   Patient provided with Augmentin for cellulitis of her right forearm.  Patient advised to continue to avoid weightbearing as much as possible on her right knee for the next 7 to 10 days and to wear the brace as she wishes.  Patient advised to follow-up with orthopedics if not improved  ED Prescriptions     Medication Sig Dispense Auth. Provider   amoxicillin-clavulanate (AUGMENTIN) 875-125 MG  tablet Take 1 tablet by mouth 2 (two) times daily for 7 days. 14 tablet Lynden Oxford Scales, PA-C      PDMP not reviewed this encounter.  Pending results:  Labs Reviewed - No data to display  Discharge Instructions:   Discharge Instructions      To treat cellulitis in your right forearm, I provided you with a prescription for Augmentin for 7 days.  If you feel that you have had resolution of your infection after 5 days of Augmentin, please feel free to discontinue.  The x-ray of your knee was unremarkable for any acute bony trauma.  I think it is likely a sprain which should be treated exactly where you are treating it.  In 7 to 10 days, if you have not had meaningful relief of your pain and instability, please follow-up with your orthopedic specialist.  Thank you for visiting urgent care today.      Disposition Upon Discharge:  Condition: stable for discharge home  Patient presented with an acute illness with associated systemic symptoms and significant discomfort requiring urgent management. In my opinion, this is a condition that a prudent lay person (someone who possesses an average knowledge of health and medicine) may potentially expect to result in complications if not addressed urgently such as respiratory distress, impairment of bodily  function or dysfunction of bodily organs.   Routine symptom specific, illness specific and/or disease specific instructions were discussed with the patient and/or caregiver at length.   As such, the patient has been evaluated and assessed, work-up was performed and treatment was provided in alignment with urgent care protocols and evidence based medicine.  Patient/parent/caregiver has been advised that the patient may require follow up for further testing and treatment if the symptoms continue in spite of treatment, as clinically indicated and appropriate.  Patient/parent/caregiver has been advised to return to the [facility] or PCP if no better; to PCP or the Emergency Department if new signs and symptoms develop, or if the current signs or symptoms continue to change or worsen for further workup, evaluation and treatment as clinically indicated and appropriate  The patient will follow up with their current PCP if and as advised. If the patient does not currently have a PCP we will assist them in obtaining one.   The patient may need specialty follow up if the symptoms continue, in spite of conservative treatment and management, for further workup, evaluation, consultation and treatment as clinically indicated and appropriate.   Patient/parent/caregiver verbalized understanding and agreement of plan as discussed.  All questions were addressed during visit.  Please see discharge instructions below for further details of plan.  This office note has been dictated using Museum/gallery curator.  Unfortunately, this method of dictation can sometimes lead to typographical or grammatical errors.  I apologize for your inconvenience in advance if this occurs.  Please do not hesitate to reach out to me if clarification is needed.      Lynden Oxford Scales, PA-C 12/30/21 1706

## 2021-12-30 NOTE — ED Triage Notes (Signed)
The patient states she has swelling to the right FA, there is redness and swelling to the area (started: about 3 days ago)  She states the dog ran into her right knee and she fell backward last night.   The patient has multiple skin tears from playing with her dog covered with DuoDerm.  Home interventions: warm compresses to right forearm

## 2021-12-30 NOTE — Discharge Instructions (Addendum)
To treat cellulitis in your right forearm, I provided you with a prescription for Augmentin for 7 days.  If you feel that you have had resolution of your infection after 5 days of Augmentin, please feel free to discontinue.  The x-ray of your knee was unremarkable for any acute bony trauma.  I think it is likely a sprain which should be treated exactly where you are treating it.  In 7 to 10 days, if you have not had meaningful relief of your pain and instability, please follow-up with your orthopedic specialist.  Thank you for visiting urgent care today.

## 2022-01-06 ENCOUNTER — Telehealth: Payer: Self-pay | Admitting: Pharmacist

## 2022-01-06 NOTE — Progress Notes (Signed)
Chronic Care Management Pharmacy Assistant   Name: Vanessa Harmon  MRN: [id number] DOB: Jan 05, 1951   Reason for Encounter: Hypertension Adherence Call    Recent office visits:  None  Recent consult visits:  12/14/2021 OV (Rheumatology) Collier Salina, MD; Her MCV is up to 106 likely due to methotrexate side effect. Recommend stopping methotrexate now and can stop leucovorin after 2 weeks. [facility] visits:  12/30/2021 ED visit for Cellulitis of right upper extremity -To treat cellulitis in your right forearm, I provided you with a prescription for Augmentin for 7 days.  If you feel that you have had resolution of your infection after 5 days of Augmentin, please feel free to discontinue  Medications: Outpatient Encounter Medications as of 01/06/2022  Medication Sig   albuterol (PROAIR HFA) 108 (90 Base) MCG/ACT inhaler Inhale two puffs every 4-6 hours if needed for cough or wheeze.   amLODipine (NORVASC) 5 MG tablet    amoxicillin-clavulanate (AUGMENTIN) 875-125 MG tablet Take 1 tablet by mouth 2 (two) times daily for 7 days.   aspirin EC 81 MG tablet Take 1 tablet (81 mg total) by mouth daily. Swallow whole.   azelastine (ASTELIN) 0.1 % nasal spray Place 1 spray into both nostrils 2 (two) times daily. Use in each nostril as directed   Bempedoic Acid (NEXLETOL) 180 MG TABS Take 1 Dose by mouth daily.   budesonide (PULMICORT) 1 MG/2ML nebulizer solution    Calcium Carb-Cholecalciferol (CALCIUM 600/VITAMIN D3 PO) Take 1 tablet by mouth daily.   carboxymethylcellulose (REFRESH PLUS) 0.5 % SOLN Place 1 drop into both eyes 3 (three) times daily as needed (dry eyes).    Cetirizine HCl (ZYRTEC ALLERGY) 10 MG CAPS Zyrtec 10 mg capsule   Cholecalciferol 125 MCG (5000 UT) TABS Vitamin D3 125 mcg (5,000 unit) tablet  Take 1 tablet every day by oral route.   denosumab (PROLIA) 60 MG/ML SOSY injection Inject 60 mg into the skin every 6 (six) months. Courier to  rheum: [address] [place], [place] [address]. Appt on 10/28/21   diphenhydrAMINE (BENADRYL) 25 MG tablet Take 50 mg by mouth daily as needed for allergies.   docusate sodium (COLACE) 100 MG capsule    escitalopram (LEXAPRO) 20 MG tablet Take 1 tablet (20 mg total) by mouth daily.   Evolocumab (REPATHA SURECLICK) 258 MG/ML SOAJ Inject 1 Dose into the skin every 14 (fourteen) days.   ezetimibe (ZETIA) 10 MG tablet TAKE ONE TABLET BY MOUTH DAILY   famotidine (PEPCID) 40 MG tablet TAKE 1 TABLET (40 MG TOTAL) BY MOUTH AT BEDTIME.   fluticasone (FLOVENT HFA) 220 MCG/ACT inhaler Inhale 2 puffs into the lungs 2 (two) times daily.   furosemide (LASIX) 20 MG tablet Take 1 tablet (20 mg total) by mouth daily.   gabapentin (NEURONTIN) 300 MG capsule Take 1 capsule (300 mg total) by mouth at bedtime.   guaiFENesin (MUCINEX) 600 MG 12 hr tablet Take 600 mg by mouth 2 (two) times daily as needed for cough or to loosen phlegm.   HYDROcodone-acetaminophen (NORCO) 10-325 MG tablet 1 tablet as needed   indomethacin (INDOCIN SR) 75 MG CR capsule TAKE ONE CAPSULE BY MOUTH DAILY WITH BREAKFAST   ipratropium (ATROVENT) 0.03 % nasal spray Place 1 spray into both nostrils 2 (two) times daily as needed for rhinitis.   irbesartan (AVAPRO) 300 MG tablet TAKE ONE TABLET BY MOUTH DAILY   Lifitegrast (XIIDRA) 5 % SOLN Place 1 drop into both eyes  in the morning and at bedtime.    methocarbamol (ROBAXIN) 500 MG tablet Take 1 tablet (500 mg total) by mouth every 6 (six) hours as needed for muscle spasms.   montelukast (SINGULAIR) 10 MG tablet Take 1 tablet (10 mg total) by mouth at bedtime.   Multiple Vitamin (MULTIVITAMIN) capsule Take 1 capsule by mouth daily.   Needles & Syringes (1CC TB SYRINGE) MISC 27g 1/2"   omeprazole (PRILOSEC) 40 MG capsule TAKE ONE CAPSULE BY MOUTH DAILY BEFORE BREAKFAST DAILY (Patient taking differently: Take 40 mg by mouth daily. TAKE ONE CAPSULE BY MOUTH DAILY BEFORE BREAKFAST DAILY)    OVER THE COUNTER MEDICATION Place 1 application into both eyes at bedtime. Genteal overnight eye ointment   polyethylene glycol powder (GLYCOLAX/MIRALAX) 17 GM/SCOOP powder Miralax 17 gram/dose oral powder   predniSONE (DELTASONE) 5 MG tablet Take 1 tablet (5 mg total) by mouth daily with breakfast.   PRESCRIPTION MEDICATION Place 1 spray into the nose in the morning and at bedtime. Budesonide 0.5 mg / 2 mL Nasal rinse   pseudoephedrine (SUDAFED) 30 MG tablet Take 30 mg by mouth every 4 (four) hours as needed for congestion.   Tocilizumab (ACTEMRA) 162 MG/0.9ML SOSY Inject 0.9 mLs (162 mg total) into the skin every 14 (fourteen) days.   traZODone (DESYREL) 150 MG tablet TAKE ONE TABLET BY MOUTH DAILY   TUBERCULIN SYR 1CC/27GX1/2" (B-D TB SYRINGE 1CC/27GX1/2") 27G X 1/2" 1 ML MISC 12 Syringes by Does not apply route once a week.   valACYclovir (VALTREX) 1000 MG tablet Take 1,000 mg by mouth daily.    vitamin C (ASCORBIC ACID) 500 MG tablet Take 500 mg by mouth daily.   No facility-administered encounter medications on file as of 01/06/2022.   Reviewed chart prior to disease state call. Spoke with patient regarding BP  Recent Office Vitals: BP Readings from Last 3 Encounters:  12/30/21 (!) 158/85  12/14/21 (!) 145/73  11/01/21 (!) 164/97   Pulse Readings from Last 3 Encounters:  12/30/21 76  12/14/21 (!) 57  11/01/21 (!) 56    Wt Readings from Last 3 Encounters:  12/14/21 177 lb 9.6 oz (80.6 kg)  10/25/21 174 lb (78.9 kg)  10/15/21 175 lb 8 oz (79.6 kg)     Kidney Function Lab Results  Component Value Date/Time   CREATININE 1.19 (H) 12/14/2021 02:05 PM   CREATININE 1.19 10/25/2021 01:28 PM   CREATININE 1.22 (H) 10/18/2021 01:59 PM   GFR 45.69 (L) 10/25/2021 01:28 PM   GFRNONAA 63 04/05/2021 12:00 AM   GFRNONAA >60 05/27/2020 01:33 PM   GFRNONAA 57 (L) 04/13/2017 12:01 PM   GFRAA >60 11/27/2019 02:44 AM   GFRAA 66 04/13/2017 12:01 PM       Latest Ref Rng & Units 12/14/2021     2:05 PM 10/25/2021    1:28 PM 10/18/2021    1:59 PM  BMP  Glucose 65 - 99 mg/dL 126  123  144   BUN 7 - 25 mg/dL '20  24  25   '$ Creatinine 0.60 - 1.00 mg/dL 1.19  1.19  1.22   BUN/Creat Ratio 6 - 22 (calc) 17   20   Sodium 135 - 146 mmol/L 142  138  138   Potassium 3.5 - 5.3 mmol/L 4.7  4.5  4.3   Chloride 98 - 110 mmol/L 103  99  100   CO2 20 - 32 mmol/L 32  30  30   Calcium 8.6 - 10.4 mg/dL 10.4  10.4  9.9     Current antihypertensive regimen:  Amlodipine 5 mg daily Furosemide 20 mg daily Irbesartan 300 mg daily  How often are you checking your Blood Pressure?   Current home BP readings:   What recent interventions/DTPs have been made by any provider to improve Blood Pressure control since last CPP Visit:   Any recent hospitalizations or ED visits since last visit with CPP?   What diet changes have been made to improve Blood Pressure Control?    What exercise is being done to improve your Blood Pressure Control?    Adherence Review: Is the patient currently on ACE/ARB medication?  Does the patient have >5 day gap between last estimated fill dates?   **Unsuccessful attempt at reaching patient to complete this call.**   Care Gaps: Medicare Annual Wellness: Completed 07/08/2021 Hemoglobin A1C: 5.8% on 07/29/2020 Colonoscopy: Postponed until 09/04/2021 Dexa Scan: Completed Mammogram: Next due on 05/24/2022  Future Appointments  Date Time Provider [place]  03/16/2022  2:00 PM Collier Salina, MD [facility] None  04/27/2022  1:00 PM Inda Coke, PA [facility]  05/23/2022  2:00 PM [facility] CCM PHARMACIST [facility]  10/06/2022  1:15 PM Rankin, Clent Demark, MD [facility] None   Star Rating Drugs: Irbesartan 300 mg last filled 12/27/2021  April D Calhoun, [place] Pharmacist Assistant [phone]

## 2022-01-07 ENCOUNTER — Encounter (HOSPITAL_BASED_OUTPATIENT_CLINIC_OR_DEPARTMENT_OTHER): Payer: Self-pay | Admitting: Internal Medicine

## 2022-01-07 ENCOUNTER — Other Ambulatory Visit: Payer: Self-pay | Admitting: Physician Assistant

## 2022-01-07 ENCOUNTER — Encounter: Payer: Self-pay | Admitting: Physician Assistant

## 2022-01-07 MED ORDER — FUROSEMIDE 20 MG PO TABS: 20.0000 mg | ORAL_TABLET | Freq: Every day | ORAL | 1 refills | Status: DC

## 2022-01-07 MED ORDER — NEXLETOL 180 MG PO TABS: 1.0000 | ORAL_TABLET | Freq: Every day | ORAL | 3 refills | Status: DC

## 2022-01-10 ENCOUNTER — Encounter: Payer: Self-pay | Admitting: *Deleted

## 2022-01-24 ENCOUNTER — Telehealth: Payer: Self-pay | Admitting: Internal Medicine

## 2022-01-24 DIAGNOSIS — Z79899 Other long term (current) drug therapy: Secondary | ICD-10-CM

## 2022-01-24 DIAGNOSIS — M353 Polymyalgia rheumatica: Secondary | ICD-10-CM

## 2022-01-24 DIAGNOSIS — M459 Ankylosing spondylitis of unspecified sites in spine: Secondary | ICD-10-CM

## 2022-01-24 MED ORDER — ACTEMRA 162 MG/0.9ML ~~LOC~~ SOSY: 162.0000 mg | PREFILLED_SYRINGE | SUBCUTANEOUS | 0 refills | Status: DC

## 2022-01-24 NOTE — Telephone Encounter (Signed)
Rx was ordered as no-print. Resent to [facility] today. Called patient to advise and apologized for confusion  Knox Saliva, PharmD, MPH, BCPS, CPP Clinical Pharmacist (Rheumatology and Pulmonology)

## 2022-01-24 NOTE — Telephone Encounter (Signed)
Patient called the office requesting a refill of Actemra. Patient is very upset stating she is due for her medication today and the pharmacy has faxed [place] for a refill request multiple times. Patients pharmacy is [facility].

## 2022-02-24 ENCOUNTER — Encounter: Payer: Self-pay | Admitting: Physician Assistant

## 2022-03-16 ENCOUNTER — Ambulatory Visit: Payer: Medicare Other | Attending: Internal Medicine | Admitting: Internal Medicine

## 2022-03-16 ENCOUNTER — Encounter: Payer: Self-pay | Admitting: Internal Medicine

## 2022-03-16 VITALS — BP 180/83 | HR 65 | Resp 17 | Ht 66.5 in | Wt 181.0 lb

## 2022-03-16 DIAGNOSIS — M353 Polymyalgia rheumatica: Secondary | ICD-10-CM | POA: Diagnosis not present

## 2022-03-16 DIAGNOSIS — M059 Rheumatoid arthritis with rheumatoid factor, unspecified: Secondary | ICD-10-CM

## 2022-03-16 DIAGNOSIS — Z79899 Other long term (current) drug therapy: Secondary | ICD-10-CM | POA: Diagnosis not present

## 2022-03-16 DIAGNOSIS — M7702 Medial epicondylitis, left elbow: Secondary | ICD-10-CM | POA: Diagnosis not present

## 2022-03-16 NOTE — Progress Notes (Signed)
Office Visit Note  Patient: Vanessa Harmon             Date of Birth: 27-Aug-1950           MRN: [id number]             PCP: Inda Coke, PA Referring: Inda Coke, PA Visit Date: 03/16/2022   Subjective:  Follow-up (Actemra causing raised lesions, feet peeling, bil toe nails turning white and peeling off)   History of Present Illness: Vanessa Harmon is a 71 y.o. female here for follow up for seropositive RA on prednisone 5 mg daily.  Methotrexate was discontinued due to worsening laboratory function tests and Actemra stopped due to developing palpable rashes throughout her legs similar to previous medication reaction to infliximab.  She is also noticed easier bruising than usual and has seen discoloration of her nails with some separation and cracking.  Prednisone is helping somewhat but still has ongoing joint pain and stiffness specially off of her previous medications.  In particular bothering her lately has been persistent pain along the medial side of her left elbow does not recall any specific injury to this area but does feel like there is a sense of swelling and pressure.  Previous HPI 12/14/21 Vanessa Harmon is a 71 y.o. female here for follow up for seropositive RA on methotrexate 15 mg subcu weekly prednisone 5 mg daily leucovorin 1 mg daily and after starting Actemra 162 mg [place] q. 14 days.  She feels like arthritis symptoms have been well controlled on her current medications.  No significant flareups.  She still gets swelling in her wrist but no other visible joint swelling.  She has had pretty extensive bruising and easy skin tearing on both arms often from playing with her dog.  Lab tests checked in July did show appears to be a small decrease in estimated GFR.   Previous HPI 09/10/2021 Vanessa Harmon is a 71 y.o. female here for evaluation of chronic back pain associated with seropositive RA she has been seeing [name] on treatment with orencia IV, MTX 15  mg [place] weekly, leucovorin 1 mg daily, and prednisone 5 mg daily.  She was originally diagnosed with inflammatory arthritis by Dr. [name] about a decade ago.  Apparently the worst initial symptom at that time was left hip pain and inflammation with concerning findings on MRI.  Was started on methotrexate as a steroid sparing DMARD treatment.  Apparently had initial concern for seronegative rheumatoid arthritis versus PMR presentation.  She did not tolerate methotrexate well initially worst side effect was substantial hair loss.  Also did not tolerate well on leflunomide.  She was started on infliximab treatment developed a significant adverse reaction with new skin rashes in multiple areas and severe finger and toe nail changes that lasted about a year after discontinuing the medication.  She was started on IV Orencia monthly infusions which has been beneficial for symptoms but still notices some amount ongoing and that symptoms tend to worsen between each dosing interval.  She was restarted on methotrexate this time with leucovorin in the past few months and this has improved her joint inflammation further.  She remains on low-dose prednisone 5 mg daily throughout most of this time.  Most recent definite flareup of her rheumatoid arthritis was about 6 months ago. In addition to inflammatory joint disease she has significant amount of generalized osteoarthritis.  She reports bone-on-bone degenerative changes with slightly impaired range of motion in the left  shoulder.  She had previous left knee total arthroplasty.  She has degenerative arthritis at multiple levels in the spine with history of repeat lumbar spine surgery.  She has some spinal and foraminal stenosis with radicular symptoms.  She is also had recurrent problems with hip bursitis has been responsive to local steroid injection treatments with Dr. [name].  She takes gabapentin 300 mg at night and as needed Robaxin 500 mg for the ongoing symptoms. She has  not had any history with recurrent infections or major side effects on her current regimen.  She does have extensive bruising mostly worst in the past year and worst on extensor surfaces of arms and legs.  She denies any history of abnormal bleeding and blood clots. She has chronic dry eyes and sees Dr. [name] currently uses xiidra and genteal drops for this.   Review of Systems  Constitutional:  Positive for fatigue.  HENT:  Negative for mouth sores and mouth dryness.   Eyes:  Positive for dryness.  Respiratory:  Negative for shortness of breath.   Cardiovascular:  Negative for chest pain and palpitations.  Gastrointestinal:  Positive for constipation. Negative for blood in stool and diarrhea.  Endocrine: Negative for increased urination.  Genitourinary:  Negative for involuntary urination.  Musculoskeletal:  Positive for joint pain, joint pain, myalgias, muscle weakness, morning stiffness and myalgias. Negative for gait problem, joint swelling and muscle tenderness.  Skin:  Positive for ulcers and sensitivity to sunlight. Negative for color change, rash and hair loss.  Allergic/Immunologic: Negative for susceptible to infections.  Neurological:  Positive for dizziness. Negative for headaches.  Hematological:  Positive for bruising/bleeding tendency. Negative for swollen glands.  Psychiatric/Behavioral:  Positive for depressed mood and sleep disturbance. The patient is nervous/anxious.     PMFS History:  Patient Active Problem List   Diagnosis Date Noted   Medial epicondylitis of left elbow 03/16/2022   High risk medication use 09/10/2021   Posterior vitreous detachment of left eye 09/29/2020   Thoracic spondylosis with cord compression 05/29/2020   Heterotopic ossification 03/30/2020   Loosening of hardware in spine ([place]) 01/27/2020   Posterior vitreous detachment of right eye 01/21/2020   Pseudophakia 01/21/2020   [place] retinal tear of right eye 01/21/2020   Body mass index  (BMI) 28.0-28.9, adult 11/27/2019   Left knee OA 11/26/2019   Numbness of foot 02/07/2019   Seropositive rheumatoid arthritis ([place]) 01/25/2019   Peripheral polyneuropathy 01/14/2019   Spinal stenosis, thoracolumbar region 01/14/2019   Thoracogenic scoliosis, thoracolumbar region 01/14/2019   Idiopathic scoliosis of thoracolumbar region 02/15/2018   Rhinitis, chronic 05/16/2017   Moderate persistent asthma without complication 32/35/5732   Polymyalgia rheumatica ([place]) 02/15/2015   DJD (degenerative joint disease) 04/15/2014   Lumbar spine scoliosis 11/28/2013   Degenerative spondylolisthesis 10/09/2013   Lumbar radiculopathy 09/16/2013   Lumbosacral radiculitis 07/31/2013   Scoliosis, or kyphoscoliosis, idiopathic 07/31/2013   Lumbar back pain 12/19/2011   Hyperlipidemia 10/16/2010   Depression 10/16/2010   Lumbar spondylosis 10/16/2010   Gastroesophageal reflux disease 10/16/2010   Adenomatous polyp of colon 10/16/2010   History of paroxysmal supraventricular tachycardia 10/16/2010   Essential (primary) hypertension 10/16/2010    Past Medical History:  Diagnosis Date   Adenomatous polyp    Allergy    Anxiety    takes Ativan daily as needed   Arthritis    inflammatory arthritis (Dr. Dagoberto Ligas)   Asthma    Cataract    Chronic back pain    scoliosis  Constipation    takes miralax every other day   Depression    takes Lexapro daily   Eczema    Essential hypertension, benign    takes Hyzaar and Metoprolol daily   Family history of adverse reaction to anesthesia    Sister PONV   Fibromyalgia    GERD (gastroesophageal reflux disease)    History of colon polyps    History of shingles    Hyperlipidemia    takes Atorvastatin daily   Insomnia    takes Melatonin nightly   Joint pain    Osteoarthritis    Osteoporosis    PSVT (paroxysmal supraventricular tachycardia)    Recurrent upper respiratory infection (URI)    Rheumatoid arthritis ([place])    sees Dr.  Amil Amen; stable on Orencia   Rhinitis, allergic    uses Flonase daily   Urinary urgency    Urticaria     Family History  Problem Relation Age of Onset   Atrial fibrillation Mother    Stroke Mother    Allergic rhinitis Mother    Allergic rhinitis Father    AAA (abdominal aortic aneurysm) Father    COPD Father    Prostate cancer Father    Atrial fibrillation Brother    Hypertension Other        unspecified grandmother   AAA (abdominal aortic aneurysm) Other        unspecified grandfather   Cancer Other        unspecified grandfather   Neuropathy Neg Hx    Past Surgical History:  Procedure Laterality Date   ADENOIDECTOMY     APPENDECTOMY     BACK SURGERY  2012   at age 63 d/t scoliosis   BLEPHAROPLASTY  2018   BREAST BIOPSY     cataract surgery     COLONOSCOPY     HARDWARE REMOVAL Left 02/24/2020   Procedure: HARDWARE REMOVAL LEFT DISTAL RADIUS;  Surgeon: Leanora Cover, MD;  Location: [facility];  Service: Orthopedics;  Laterality: Left;   KNEE ARTHROSCOPY Bilateral    neuroma removed from foot     unsure of which foot   OPEN REDUCTION INTERNAL FIXATION (ORIF) DISTAL RADIAL FRACTURE Left 08/15/2019   Procedure: OPEN REDUCTION INTERNAL FIXATION (ORIF) DISTAL RADIAL FRACTURE;  Surgeon: Leanora Cover, MD;  Location: [facility];  Service: Orthopedics;  Laterality: Left;  block in preop   POSTERIOR CERVICAL FUSION/FORAMINOTOMY N/A 05/29/2020   Procedure: Removal of hardware, Replacement of thoracic 8, thoracic 9, thoracic 10 screws, Right Thoracic 6-7, Right Thoracic 7-8 Laminectomies with thoracic hooks from Thoracic 4-5 to Thoracic 10;  Surgeon: Erline Levine, MD;  Location: [facility];  Service: Neurosurgery;  Laterality: N/A;   POSTERIOR LUMBAR FUSION 4 LEVEL N/A 02/15/2018   Procedure: Decompression and fusion Thoracic nine to Lumbar two with exploration of previous fusion;  Surgeon: Erline Levine, MD;  Location: [facility];  Service: Neurosurgery;  Laterality:  N/A;   SHOULDER SURGERY Right    TONSILLECTOMY     TOTAL HIP ARTHROPLASTY Right 04/15/2014   Procedure: RIGHT TOTAL HIP ARTHROPLASTY ANTERIOR APPROACH;  Surgeon: Renette Butters, MD;  Location: [facility];  Service: Orthopedics;  Laterality: Right;   TOTAL KNEE ARTHROPLASTY Left 11/26/2019   Procedure: TOTAL KNEE ARTHROPLASTY;  Surgeon: Paralee Cancel, MD;  Location: [facility];  Service: Orthopedics;  Laterality: Left;  70 mins   Social History   Social History Narrative   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   Lives with husband  Immunization History  Administered Date(s) Administered   Influenza Split 01/31/2012   Influenza Whole 02/16/2010   Influenza, High Dose Seasonal PF 12/13/2018   Influenza,inj,Quad PF,6+ Mos 01/14/2014, 01/27/2015, 02/02/2016   Influenza,inj,quad, With Preservative 02/26/2020   Influenza-Unspecified 02/24/2021   PFIZER(Purple Top)SARS-COV-2 Vaccination 05/25/2019, 06/15/2019, 02/05/2020, 08/03/2020   Pneumococcal Conjugate-13 01/27/2015   Pneumococcal Polysaccharide-23 05/26/2017   Tdap 10/04/2011   Zoster Recombinat (Shingrix) 10/13/2017, 01/02/2018     Objective: Vital Signs: BP (!) 180/83 (BP Location: Left Arm, Patient Position: Sitting, Cuff Size: Normal)   Pulse 65   Resp 17   Ht 5' 6.5" (1.689 m)   Wt 181 lb (82.1 kg)   BMI 28.78 kg/m    Physical Exam Cardiovascular:     Rate and Rhythm: Normal rate and regular rhythm.  Pulmonary:     Effort: Pulmonary effort is normal.     Breath sounds: Normal breath sounds.  Skin:    General: Skin is warm and dry.     Findings: Bruising present.     Comments: Multiple dmsll bruises extensor surfaces of both forearms Neurological:     Mental Status: She is alert.  Psychiatric:        Mood and Affect: Mood normal.      Musculoskeletal Exam:  Shoulders full ROM no tenderness or swelling Elbows full ROM no tenderness or swelling Wrists full ROM no tenderness or swelling, chronic appearing swelling at ulnar  side Fingers full ROM, heberdon's nodes of both hands and DIPs lateral deviations Knees full ROM right knee patellofemoral crepitus, no palpable swelling Ankles full ROM no tenderness or swelling Dystrophic nail changes, distal half of great toenails separated and white coloration  Investigation: No additional findings.  Imaging: No results found.  Recent Labs: Lab Results  Component Value Date   WBC 8.5 03/16/2022   HGB 13.6 03/16/2022   PLT 256 03/16/2022   NA 142 03/16/2022   K 4.8 03/16/2022   CL 101 03/16/2022   CO2 32 03/16/2022   GLUCOSE 130 03/16/2022   BUN 20 03/16/2022   CREATININE 0.94 03/16/2022   BILITOT 0.5 03/16/2022   ALKPHOS 30 (L) 10/25/2021   AST 20 03/16/2022   ALT 23 03/16/2022   PROT 6.9 03/16/2022   ALBUMIN 4.7 10/25/2021   CALCIUM 10.1 03/16/2022   GFRAA >60 11/27/2019   QFTBGOLDPLUS NEGATIVE 09/10/2021    Speciality Comments: No specialty comments available.  Procedures:  Hand/UE Inj: L elbow for medial epicondylitis on 03/16/2022 3:00 PM Indications: pain Details: 25 G needle, medial approach Medications: 1 mL lidocaine 1 %; 40 mg triamcinolone acetonide 40 MG/ML Outcome: tolerated well, no immediate complications Procedure, treatment alternatives, risks and benefits explained, specific risks discussed. Consent was given by the patient. Immediately prior to procedure a time out was called to verify the correct patient, procedure, equipment, support staff and site/side marked as required. Patient was prepped and draped in the usual sterile fashion.     Allergies: Advair hfa [fluticasone-salmeterol], Statins, Breo ellipta [fluticasone furoate-vilanterol], Fosamax [alendronate sodium], Levaquin [levofloxacin], Doxycycline hyclate, Infliximab, Spiriva handihaler [tiotropium bromide monohydrate], Verapamil, and Tape   Assessment / Plan:     Visit Diagnoses: Seropositive rheumatoid arthritis ([place]) - Plan: C-reactive protein  Not too much  peripheral joint inflammation on exam today but remains on prednisone and having daily symptoms.  Will check CRP for disease activity monitoring.  Discussed alternative treatment options recommend we try changing her to rinvoq due to significant intolerance with infliximab and with Actemra.  Continue  low-dose prednisone 5 mg daily in the meantime.  High risk medication use - Plan: CBC with Differential/Platelet, COMPLETE METABOLIC PANEL WITH GFR, Lipid panel  Checking CBC and CMP also lipid panel baseline monitoring for plan to start rinvoq.  Believe risks are much less than that undertreated systemic inflammatory disease or requiring higher dose glucocorticoids.  Reviewed risks of medication including infections cytopenia hepatotoxicity requiring lab monitoring, major cardiovascular events diverticulitis in thrombosis she has no specific clinical history of these previously.  Does have some existing cardiovascular risk factors related to age and hypertension discussed these and agrees to proceed with treatment plan.  Medial epicondylitis of left elbow - Plan: Hand/UE Inj: L elbow  Left elbow symptoms appear consistent with medial epicondylitis suspect degenerative or overuse related versus specific traumatic event.  Steroid injection today for symptom improvement and discussed to consider wearing supporting elbow brace for this until it improves more.  Orders: Orders Placed This Encounter  Procedures   Hand/UE Inj: L elbow   C-reactive protein   CBC with Differential/Platelet   COMPLETE METABOLIC PANEL WITH GFR   Lipid panel   No orders of the defined types were placed in this encounter.    Follow-Up Instructions: Return in about 3 months (around 06/16/2022) for RA on GC/?rinvoq start f/u 42mo.   CCollier Salina MD  Note - This record has been created using [employer]  Chart creation errors have been sought, but may not always  have been located. Such creation errors do not  reflect on  the standard of medical care.

## 2022-03-16 NOTE — Patient Instructions (Signed)
Upadacitinib Extended-Release Tablets What is this medication? UPADACITINIB (ue PAD a SYE ti nib) treats autoimmune conditions, such as arthritis, eczema, and ulcerative colitis. It is prescribed when other medications have not worked or cannot be tolerated. It works by slowing down an overactive immune system. This decreases inflammation. This medicine may be used for other purposes; ask your health care provider or pharmacist if you have questions. COMMON BRAND NAME(S): RINVOQ What should I tell my care team before I take this medication? They need to know if you have any of these conditions: Blood clots Cancer Diabetes (high blood sugar) Heart disease High blood pressure High cholesterol Immune system problems Infection, especially a viral infection, such as chickenpox, cold sores, or herpes Infection, such as tuberculosis (TB), or other bacterial, fungal or viral infection Kidney disease Liver disease Low blood cell levels (white cells, red cells, and platelets) Lung or breathing disease, such as asthma or COPD Organ transplant Recent or upcoming vaccine Skin cancer/melanoma Stomach or intestine problems Stroke Tobacco use An unusual or allergic reaction to upadacitinib, other medications, foods, dyes or preservatives Pregnant or trying to get pregnant Breast-feeding How should I use this medication? Take this medication by mouth with water. Take it as directed on the prescription label at the same time every day. Do not cut, crush, or chew this medication. Swallow the tablets whole. You can take it with or without food. If it upsets your stomach, take it with food. Do not take this medication with grapefruit juice. Keep taking it unless your care team tells you to stop. A special MedGuide will be given to you by the pharmacist with each prescription and refill. Be sure to read this information carefully each time. Talk to your care team about the use of this medication in  children. While this medication may be prescribed for children as young as 12 years for selected conditions, precautions do apply. Overdosage: If you think you have taken too much of this medicine contact a poison control center or emergency room at once. NOTE: This medicine is only for you. Do not share this medicine with others. What if I miss a dose? If you miss a dose, take it as soon as you can. If it is almost time for your next dose, take only that dose. Do not take double or extra doses. What may interact with this medication? Do not take this medication with any of the following: Baricitinib Tofacitinib This medication may also interact with the following: Azathioprine Certain antivirals for hepatitis or HIV Biologic medications, such as abatacept, adalimumab, anakinra, certolizumab, etanercept, golimumab, infliximab, rituximab, secukinumab, tocilizumab, ustekinumab Certain medications for fungal infections, such as ketoconazole, itraconazole, posaconazole, or voriconazole Certain medications for seizures, such as carbamazepine, phenobarbital, phenytoin Clarithromycin Cyclosporine Grapefruit and grapefruit juice Live virus vaccines Medications that lower your chance of fighting infection Mifepristone Nefazodone Rifampin Supplements, such as St. John's wort This list may not describe all possible interactions. Give your health care provider a list of all the medicines, herbs, non-prescription drugs, or dietary supplements you use. Also tell them if you smoke, drink alcohol, or use illegal drugs. Some items may interact with your medicine. What should I watch for while using this medication? Visit your care team for regular checks on your progress. Tell your care team if your symptoms do not start to get better or if they get worse. You may need blood work while taking this medication. Avoid taking medications that contain aspirin, acetaminophen, ibuprofen, naproxen, or ketoprofen    unless instructed by your care team. These medications may hide a fever. This medication may increase your risk of getting an infection. Call your care team for advice if you get a fever, chills, sore throat, or other symptoms of a cold or flu. Do not treat yourself. Try to avoid being around people who are sick. Talk to your care team if you wish to become pregnant or think you might be pregnant. This medication can cause serious birth defects if taken during pregnancy and for 4 weeks after stopping it. A barrier contraceptive, such as a condom or diaphragm, is recommended while taking this medication and for 4 weeks after stopping it. Talk to your care team about other forms of contraception. Do not breastfeed while taking this medication and for 6 days after the last dose. Talk to your care team about your risk of cancer. You may be more at risk for certain types of cancer if you take this medication. Talk to your care team about your risk of skin cancer. You may be more at risk for skin cancer if you take this medication. This medication can make you more sensitive to the sun. Keep out of the sun. If you cannot avoid being in the sun, wear protective clothing and sunscreen. Do not use sun lamps or tanning beds/booths. Tell your care team right away if you have any change in your eyesight. Talk to your care team if you often see part of the tablet in your stool. What side effects may I notice from receiving this medication? Side effects that you should report to your care team as soon as possible: Allergic reactions--skin rash, itching, hives, swelling of the face, lips, tongue, or throat Blood clot--pain, swelling, or warmth in the leg, shortness of breath, chest pain Change in vision Heart attack--pain or tightness in the chest, shoulders, arms, or jaw, nausea, shortness of breath, cold or clammy skin, feeling faint or lightheaded Infection--fever, chills, cough, sore throat, wounds that don't  heal, pain or trouble when passing urine, general feeling of discomfort or being unwell Liver injury--right upper belly pain, loss of appetite, nausea, light-colored stool, dark yellow or brown urine, yellowing skin or eyes, unusual weakness or fatigue Low red blood cell level--unusual weakness or fatigue, dizziness, headache, trouble breathing Sudden or severe stomach pain, bloody diarrhea, fever, nausea, vomiting Stroke--sudden numbness or weakness of the face, arm, or leg, trouble speaking, confusion, trouble walking, loss of balance or coordination, dizziness, severe headache, change in vision Side effects that usually do not require medical attention (report to your care team if they continue or are bothersome): Acne Cough Headache Nausea Runny or stuffy nose This list may not describe all possible side effects. Call your doctor for medical advice about side effects. You may report side effects to FDA at 1-800-FDA-1088. Where should I keep my medication? Keep out of the reach of children and pets. Store at room temperature between 20 and 25 degrees C (68 and 77 degrees F). Protect from moisture. Keep the container tightly closed. Keep this medication in the original container until you are ready to take it. Get rid of any unused medication after the expiration date. To get rid of medications that are no longer needed or have expired: Take the medication to a medication take-back program. Check with your pharmacy or law enforcement to find a location. If you cannot return the medication, check the label or package insert to see if the medication should be thrown out in the garbage   or flushed down the toilet. If you are not sure, ask your care team. If it is safe to put it in the trash, empty the medication out of the container. Mix the medication with cat litter, dirt, coffee grounds, or other unwanted substance. Seal the mixture in a bag or container. Put it in the trash. NOTE: This sheet is a  summary. It may not cover all possible information. If you have questions about this medicine, talk to your doctor, pharmacist, or health care provider.  2023 Elsevier/Gold Standard (2021-02-10 00:00:00)  

## 2022-03-17 ENCOUNTER — Telehealth: Payer: Self-pay | Admitting: Pharmacist

## 2022-03-17 LAB — CBC WITH DIFFERENTIAL/PLATELET
Absolute Monocytes: 536 cells/uL (ref 200–950)
Basophils Absolute: 60 cells/uL (ref 0–200)
Basophils Relative: 0.7 %
Eosinophils Absolute: 34 cells/uL (ref 15–500)
Eosinophils Relative: 0.4 %
HCT: 39.6 % (ref 35.0–45.0)
Hemoglobin: 13.6 g/dL (ref 11.7–15.5)
Lymphs Abs: 723 cells/uL — ABNORMAL LOW (ref 850–3900)
MCH: 34.9 pg — ABNORMAL HIGH (ref 27.0–33.0)
MCHC: 34.3 g/dL (ref 32.0–36.0)
MCV: 101.5 fL — ABNORMAL HIGH (ref 80.0–100.0)
MPV: 12.3 fL (ref 7.5–12.5)
Monocytes Relative: 6.3 %
Neutro Abs: 7149 cells/uL (ref 1500–7800)
Neutrophils Relative %: 84.1 %
Platelets: 256 10*3/uL (ref 140–400)
RBC: 3.9 10*6/uL (ref 3.80–5.10)
RDW: 11.5 % (ref 11.0–15.0)
Total Lymphocyte: 8.5 %
WBC: 8.5 10*3/uL (ref 3.8–10.8)

## 2022-03-17 LAB — C-REACTIVE PROTEIN: CRP: 0.3 mg/L (ref <8.0)

## 2022-03-17 LAB — COMPLETE METABOLIC PANEL WITH GFR
AG Ratio: 2.3 (calc) (ref 1.0–2.5)
ALT: 23 U/L (ref 6–29)
AST: 20 U/L (ref 10–35)
Albumin: 4.8 g/dL (ref 3.6–5.1)
Alkaline phosphatase (APISO): 36 U/L — ABNORMAL LOW (ref 37–153)
BUN: 20 mg/dL (ref 7–25)
CO2: 32 mmol/L (ref 20–32)
Calcium: 10.1 mg/dL (ref 8.6–10.4)
Chloride: 101 mmol/L (ref 98–110)
Creat: 0.94 mg/dL (ref 0.60–1.00)
Globulin: 2.1 g/dL (calc) (ref 1.9–3.7)
Glucose, Bld: 130 mg/dL (ref 65–139)
Potassium: 4.8 mmol/L (ref 3.5–5.3)
Sodium: 142 mmol/L (ref 135–146)
Total Bilirubin: 0.5 mg/dL (ref 0.2–1.2)
Total Protein: 6.9 g/dL (ref 6.1–8.1)
eGFR: 64 mL/min/{1.73_m2} (ref >=60)

## 2022-03-17 LAB — LIPID PANEL
Cholesterol: 121 mg/dL (ref <200)
HDL: 63 mg/dL (ref >=50)
LDL Cholesterol (Calc): 33 mg/dL (calc)
Non-HDL Cholesterol (Calc): 58 mg/dL (calc) (ref <130)
Total CHOL/HDL Ratio: 1.9 (calc) (ref <5.0)
Triglycerides: 171 mg/dL — ABNORMAL HIGH (ref <150)

## 2022-03-17 NOTE — Telephone Encounter (Addendum)
SAVED a Prior Authorization request to [facility] for RINVOQ via CoverMyMeds. Submission is pending OV note from 03/16/22 to be signed.  Key: [id number]  Knox Saliva, PharmD, MPH, BCPS, CPP Clinical Pharmacist (Rheumatology and Pulmonology)  ----- Message from Shona Needles, RT sent at 03/16/2022  4:59 PM EST ----- Regarding: NEW START RINVOQ

## 2022-03-27 MED ORDER — TRIAMCINOLONE ACETONIDE 40 MG/ML IJ SUSP
40.0000 mg | INTRAMUSCULAR | Status: AC | PRN
  Administered 2022-03-16: 40 mg

## 2022-03-27 MED ORDER — LIDOCAINE HCL 1 % IJ SOLN
1.0000 mL | INTRAMUSCULAR | Status: AC | PRN
  Administered 2022-03-16: 1 mL

## 2022-03-27 NOTE — Telephone Encounter (Signed)
Note is closed, thanks

## 2022-03-28 NOTE — Telephone Encounter (Signed)
PA for Rinvoq submitted to [employer] with clinicals   Key: [id number]  Knox Saliva, PharmD, MPH, BCPS, CPP Clinical Pharmacist (Rheumatology and Pulmonology)

## 2022-03-29 ENCOUNTER — Other Ambulatory Visit (HOSPITAL_COMMUNITY): Payer: Self-pay

## 2022-03-29 NOTE — Telephone Encounter (Signed)
Received notification from [facility] regarding a prior authorization for [facility]. Authorization has been APPROVED from 03/28/22 to 09/27/2022. Approval letter sent to scan center.  Per test claim, copay for 30 days supply is $327.12  Patient can fill through [place]: [phone]   Authorization #  [id number] Phone # [phone]  Will need to pursue patient assistance through [place]. Provider portion placed in Dr. Marveen Reeks folder for signature. Patient states she will stop by the clinic to sign hre part. Placed in file cabinet up fron  Knox Saliva, PharmD, MPH, BCPS, CPP Clinical Pharmacist (Rheumatology and Pulmonology)

## 2022-03-30 NOTE — Telephone Encounter (Signed)
Signed provider form for Rinvoq [employer] patient assistance received. Pending patient to stop by clinic to sign her portion of application  Knox Saliva, PharmD, MPH, BCPS, CPP Clinical Pharmacist (Rheumatology and Pulmonology)

## 2022-03-31 ENCOUNTER — Other Ambulatory Visit: Payer: Self-pay | Admitting: Physician Assistant

## 2022-03-31 NOTE — Telephone Encounter (Signed)
Submitted Patient Assistance Application to [place] for [facility] along with provider portion, patient ortion, med list, insurance card copy, and PA and income documents. Will update patient when we receive a response.  Fax# [phone] Phone# [phone]  Knox Saliva, PharmD, MPH, BCPS, CPP Clinical Pharmacist (Rheumatology and Pulmonology)

## 2022-04-01 ENCOUNTER — Other Ambulatory Visit (HOSPITAL_COMMUNITY): Payer: Self-pay

## 2022-04-04 ENCOUNTER — Telehealth: Payer: Self-pay | Admitting: Internal Medicine

## 2022-04-04 NOTE — Telephone Encounter (Signed)
PA for repatha submitted via CMM (Key: [id number])

## 2022-04-05 NOTE — Telephone Encounter (Signed)
Request Reference Number: [id number]. REPATHA SURE INJ '140MG'$ /ML is approved through 04/18/2023. Your patient may now fill this prescription and it will be covered.

## 2022-04-06 ENCOUNTER — Other Ambulatory Visit: Payer: Self-pay

## 2022-04-08 ENCOUNTER — Other Ambulatory Visit: Payer: Self-pay

## 2022-04-14 ENCOUNTER — Other Ambulatory Visit: Payer: Self-pay | Admitting: Physician Assistant

## 2022-04-14 ENCOUNTER — Telehealth: Payer: Self-pay | Admitting: Internal Medicine

## 2022-04-14 NOTE — Telephone Encounter (Signed)
PA for nexletol submitted via CMM (Key: [id number])

## 2022-04-19 ENCOUNTER — Encounter: Payer: Self-pay | Admitting: Internal Medicine

## 2022-04-20 NOTE — Telephone Encounter (Signed)
Called [employer] for update on application. Per rep, they never received application. Will refax today  Knox Saliva, PharmD, MPH, BCPS, CPP Clinical Pharmacist (Rheumatology and Pulmonology)

## 2022-04-20 NOTE — Telephone Encounter (Signed)
It looks like you sent everything already based on note from 03/31/22 and awaiting a response so probably no news? But just passing this along.

## 2022-04-21 ENCOUNTER — Telehealth: Payer: Self-pay | Admitting: Internal Medicine

## 2022-04-21 NOTE — Telephone Encounter (Signed)
Patient called stating she stopped taking Actemra approximately 2 months ago and is still waiting for her new medication to be approved.  Patient states she is experiencing a lot of pain and requested a prescription of Prednisone be sent to [facility].

## 2022-04-21 NOTE — Telephone Encounter (Signed)
Patient called to confirm [facility] received her Mychart message regarding her Prolia injection.

## 2022-04-21 NOTE — Telephone Encounter (Signed)
We are waiting to hear back about Rinvoq from pt assistance program. We had to refax entire application. We will f/u early next week  Knox Saliva, PharmD, MPH, BCPS, CPP Clinical Pharmacist (Rheumatology and Pulmonology)

## 2022-04-22 ENCOUNTER — Other Ambulatory Visit (HOSPITAL_COMMUNITY): Payer: Self-pay

## 2022-04-22 NOTE — Telephone Encounter (Signed)
MyChart message received. I've responded to it.  Knox Saliva, PharmD, MPH, BCPS, CPP Clinical Pharmacist (Rheumatology and Pulmonology)

## 2022-04-27 ENCOUNTER — Encounter: Payer: Self-pay | Admitting: Physician Assistant

## 2022-04-27 ENCOUNTER — Other Ambulatory Visit: Payer: Self-pay | Admitting: Physician Assistant

## 2022-04-27 ENCOUNTER — Ambulatory Visit (INDEPENDENT_AMBULATORY_CARE_PROVIDER_SITE_OTHER): Payer: Medicare Other | Admitting: Physician Assistant

## 2022-04-27 VITALS — BP 150/90 | HR 68 | Temp 97.5°F | Ht 66.5 in | Wt 181.2 lb

## 2022-04-27 DIAGNOSIS — M5416 Radiculopathy, lumbar region: Secondary | ICD-10-CM | POA: Diagnosis not present

## 2022-04-27 DIAGNOSIS — E663 Overweight: Secondary | ICD-10-CM

## 2022-04-27 DIAGNOSIS — I10 Essential (primary) hypertension: Secondary | ICD-10-CM | POA: Diagnosis not present

## 2022-04-27 DIAGNOSIS — M459 Ankylosing spondylitis of unspecified sites in spine: Secondary | ICD-10-CM

## 2022-04-27 DIAGNOSIS — F419 Anxiety disorder, unspecified: Secondary | ICD-10-CM | POA: Diagnosis not present

## 2022-04-27 MED ORDER — AMLODIPINE BESYLATE 10 MG PO TABS: 10.0000 mg | ORAL_TABLET | Freq: Every day | ORAL | 1 refills | Status: DC

## 2022-04-27 MED ORDER — PREDNISONE 20 MG PO TABS: 20.0000 mg | ORAL_TABLET | Freq: Every day | ORAL | 0 refills | Status: DC

## 2022-04-27 NOTE — Telephone Encounter (Signed)
Called [employer] for an update on the application. Per rep, they are still processing the application. He gave an estimate of 24-48 hours.

## 2022-04-27 NOTE — Progress Notes (Signed)
Vanessa Harmon is a 72 y.o. female here for a follow up of a pre-existing problem.  History of Present Illness:   Chief Complaint  Patient presents with   Hypertension    Pt has been checking blood pressure,but systolic has been running 356, diastolic 86-16.   Anxiety   Depression    HPI  Anxiety/Depression  Patient here to 6 months follow-up. She is currently compliant with taking Lexapro 20 mg daily with no complications. Tolerating her medication well with no side effects. Denies SI/HI    HTN Currently taking Irbesartan 300 mg daily, Amlodipine 5 mg daily, and Lasix 20 mg daily with no complications. Her Lasix was decreased to '20mg'$  daily at our last visit in 10/2021. At home blood pressure readings are: 150s/80s for the most part, occasionally 120s. However, she states that she does not frequently check her blood pressure.  Patient denies chest pain, SOB, blurred vision, dizziness, or unusual headaches. She wears compressions socks daily for chronic leg swelling with good control.   She is trying to reduce her salt intake. She drinks a glass of red wine occasionally with dinner or when she goes out.  She takes Singulair, Sudafed, Zyrtec, and Astelin daily for her allergies. She uses Mucinex occasionally. She is seeing her allergist next week. She knows that the Sudafed could be impacting her elevated BP.  BP Readings from Last 5 Encounters:  04/27/22 (!) 150/90  03/16/22 (!) 180/83  12/30/21 (!) 158/85  12/14/21 (!) 145/73  11/01/21 (!) 164/97    Overweight She is playing I the backyard with her dog and walking as much as she can. However her chronic pain limits her activity. She states that she would like to get back to a walking routine but cannot tolerate it. Patient has tried weight watchers as well as a 1200 calories/day deficits with my fitness pal. Wt Readings from Last 5 Encounters:  04/27/22 181 lb 4 oz (82.2 kg)  03/16/22 181 lb (82.1 kg)  12/14/21 177 lb  9.6 oz (80.6 kg)  10/25/21 174 lb (78.9 kg)  10/15/21 175 lb 8 oz (79.6 kg)     Rheumatoid Arthritis; Chronic low back pain Patient has been dealing with this for several years.  Followed by [facility] Rheumat[facility] Dr. Vernelle Emerald. Pt is currently is supposed to start Rinvoq but has not yet received the approval for this Rx.  She is taking her Hydrocodone 4 times per day as her pain has worsened. She is also getting dry needling throughout her back weekly with [facility].  Has tried aquatic therapy in the past but does not feel this worked well for her.  Would like a handicap placard and will provide the paperwork for this.    Past Medical History:  Diagnosis Date   Adenomatous polyp    Allergy    Anxiety    takes Ativan daily as needed   Arthritis    inflammatory arthritis (Dr. Dagoberto Ligas)   Asthma    Cataract    Chronic back pain    scoliosis    Constipation    takes miralax every other day   Depression    takes Lexapro daily   Eczema    Essential hypertension, benign    takes Hyzaar and Metoprolol daily   Family history of adverse reaction to anesthesia    Sister PONV   Fibromyalgia    GERD (gastroesophageal reflux disease)    History of colon polyps    History of shingles  Hyperlipidemia    takes Atorvastatin daily   Insomnia    takes Melatonin nightly   Joint pain    Osteoarthritis    Osteoporosis    PSVT (paroxysmal supraventricular tachycardia)    Recurrent upper respiratory infection (URI)    Rheumatoid arthritis ([place])    sees Dr. Amil Amen; stable on Orencia   Rhinitis, allergic    uses Flonase daily   Urinary urgency    Urticaria      Social History   Tobacco Use   Smoking status: Former    Packs/day: 1.00    Years: 20.00    Total pack years: 20.00    Types: Cigarettes    Quit date: 1998    Years since quitting: 26.0    Passive exposure: Never   Smokeless tobacco: Never  Vaping Use   Vaping Use: Never used  Substance  Use Topics   Alcohol use: Yes    Comment: rarely with dinner   Drug use: No    Past Surgical History:  Procedure Laterality Date   ADENOIDECTOMY     APPENDECTOMY     BACK SURGERY  2012   at age 47 d/t scoliosis   BLEPHAROPLASTY  2018   BREAST BIOPSY     cataract surgery     COLONOSCOPY     HARDWARE REMOVAL Left 02/24/2020   Procedure: HARDWARE REMOVAL LEFT DISTAL RADIUS;  Surgeon: Leanora Cover, MD;  Location: [place];  Service: Orthopedics;  Laterality: Left;   KNEE ARTHROSCOPY Bilateral    neuroma removed from foot     unsure of which foot   OPEN REDUCTION INTERNAL FIXATION (ORIF) DISTAL RADIAL FRACTURE Left 08/15/2019   Procedure: OPEN REDUCTION INTERNAL FIXATION (ORIF) DISTAL RADIAL FRACTURE;  Surgeon: Leanora Cover, MD;  Location: [place];  Service: Orthopedics;  Laterality: Left;  block in preop   POSTERIOR CERVICAL FUSION/FORAMINOTOMY N/A 05/29/2020   Procedure: Removal of hardware, Replacement of thoracic 8, thoracic 9, thoracic 10 screws, Right Thoracic 6-7, Right Thoracic 7-8 Laminectomies with thoracic hooks from Thoracic 4-5 to Thoracic 10;  Surgeon: Erline Levine, MD;  Location: [place];  Service: Neurosurgery;  Laterality: N/A;   POSTERIOR LUMBAR FUSION 4 LEVEL N/A 02/15/2018   Procedure: Decompression and fusion Thoracic nine to Lumbar two with exploration of previous fusion;  Surgeon: Erline Levine, MD;  Location: [place];  Service: Neurosurgery;  Laterality: N/A;   SHOULDER SURGERY Right    TONSILLECTOMY     TOTAL HIP ARTHROPLASTY Right 04/15/2014   Procedure: RIGHT TOTAL HIP ARTHROPLASTY ANTERIOR APPROACH;  Surgeon: Renette Butters, MD;  Location: [place];  Service: Orthopedics;  Laterality: Right;   TOTAL KNEE ARTHROPLASTY Left 11/26/2019   Procedure: TOTAL KNEE ARTHROPLASTY;  Surgeon: Paralee Cancel, MD;  Location: [facility];  Service: Orthopedics;  Laterality: Left;  70 mins    Family History  Problem Relation Age of Onset   Atrial  fibrillation Mother    Stroke Mother    Allergic rhinitis Mother    Allergic rhinitis Father    AAA (abdominal aortic aneurysm) Father    COPD Father    Prostate cancer Father    Atrial fibrillation Brother    Hypertension Other        unspecified grandmother   AAA (abdominal aortic aneurysm) Other        unspecified grandfather   Cancer Other        unspecified grandfather   Neuropathy Neg Hx     Allergies  Allergen  Reactions   Advair Hfa [Fluticasone-Salmeterol] Other (See Comments)    Hoarness   Statins Other (See Comments)   Breo Ellipta [Fluticasone Furoate-Vilanterol] Other (See Comments)    Severe hoarseness    Fosamax [Alendronate Sodium] Other (See Comments)    myalgias   Levaquin [Levofloxacin] Other (See Comments)    Lightheadedness, not feeling well, ear pain   Doxycycline Hyclate    Infliximab Other (See Comments)    Skin reaction   Spiriva Handihaler [Tiotropium Bromide Monohydrate]     UNSPECIFIED REACTION    Verapamil    Tape Rash    PAPER TAPE: Causes severe rash and weeping    Current Medications:   Current Outpatient Medications:    albuterol (PROAIR HFA) 108 (90 Base) MCG/ACT inhaler, Inhale two puffs every 4-6 hours if needed for cough or wheeze., Disp: 8.5 Inhaler, Rfl: 1   amLODipine (NORVASC) 10 MG tablet, Take 1 tablet (10 mg total) by mouth daily., Disp: 90 tablet, Rfl: 1   aspirin EC 81 MG tablet, Take 1 tablet (81 mg total) by mouth daily. Swallow whole., Disp: 30 tablet, Rfl: 11   azelastine (ASTELIN) 0.1 % nasal spray, Place 1 spray into both nostrils 2 (two) times daily. Use in each nostril as directed, Disp: , Rfl:    Bempedoic Acid (NEXLETOL) 180 MG TABS, Take 1 Dose by mouth daily., Disp: 90 tablet, Rfl: 3   betamethasone dipropionate 0.05 % cream, SMARTSIG:1 sparingly Topical Twice Daily, Disp: , Rfl:    budesonide (PULMICORT) 0.5 MG/2ML nebulizer solution, Take by nebulization., Disp: , Rfl:    Calcium Carb-Cholecalciferol (CALCIUM  600/VITAMIN D3 PO), Take 1 tablet by mouth daily., Disp: , Rfl:    carboxymethylcellulose (REFRESH PLUS) 0.5 % SOLN, Place 1 drop into both eyes 3 (three) times daily as needed (dry eyes). , Disp: , Rfl:    Cetirizine HCl (ZYRTEC ALLERGY) 10 MG CAPS, Zyrtec 10 mg capsule, Disp: , Rfl:    Cholecalciferol 125 MCG (5000 UT) TABS, Vitamin D3 125 mcg (5,000 unit) tablet  Take 1 tablet every day by oral route., Disp: , Rfl:    denosumab (PROLIA) 60 MG/ML SOSY injection, Inject 60 mg into the skin every 6 (six) months. Courier to rheum: [address] [place], [place] [address]. Appt on 10/28/21, Disp: 1 mL, Rfl: 0   diphenhydrAMINE (BENADRYL) 25 MG tablet, Take 50 mg by mouth daily as needed for allergies., Disp: , Rfl:    docusate sodium (COLACE) 100 MG capsule, , Disp: , Rfl:    escitalopram (LEXAPRO) 20 MG tablet, Take 1 tablet (20 mg total) by mouth daily., Disp: 90 tablet, Rfl: 3   Evolocumab (REPATHA SURECLICK) 981 MG/ML SOAJ, Inject 1 Dose into the skin every 14 (fourteen) days., Disp: 6 mL, Rfl: 3   ezetimibe (ZETIA) 10 MG tablet, TAKE ONE TABLET BY MOUTH DAILY, Disp: 90 tablet, Rfl: 0   famotidine (PEPCID) 40 MG tablet, TAKE 1 TABLET (40 MG TOTAL) BY MOUTH AT BEDTIME., Disp: 30 tablet, Rfl: 5   fluticasone (FLOVENT HFA) 220 MCG/ACT inhaler, Inhale 2 puffs into the lungs 2 (two) times daily., Disp: , Rfl:    furosemide (LASIX) 20 MG tablet, Take 1 tablet (20 mg total) by mouth daily., Disp: 90 tablet, Rfl: 1   gabapentin (NEURONTIN) 300 MG capsule, Take 1 capsule (300 mg total) by mouth at bedtime., Disp: 90 capsule, Rfl: 3   guaiFENesin (MUCINEX) 600 MG 12 hr tablet, Take 600 mg by mouth 2 (two) times daily as  needed for cough or to loosen phlegm., Disp: , Rfl:    HYDROcodone-acetaminophen (NORCO) 10-325 MG tablet, 1 tablet as needed, Disp: , Rfl:    indomethacin (INDOCIN SR) 75 MG CR capsule, TAKE ONE CAPSULE BY MOUTH DAILY WITH BREAKFAST, Disp: 90 capsule, Rfl: 0   ipratropium (ATROVENT)  0.03 % nasal spray, Place 1 spray into both nostrils 2 (two) times daily as needed for rhinitis., Disp: , Rfl:    irbesartan (AVAPRO) 300 MG tablet, TAKE ONE TABLET BY MOUTH DAILY, Disp: 90 tablet, Rfl: 0   Lifitegrast (XIIDRA) 5 % SOLN, Place 1 drop into both eyes in the morning and at bedtime. , Disp: , Rfl:    methocarbamol (ROBAXIN) 500 MG tablet, Take 1 tablet (500 mg total) by mouth every 6 (six) hours as needed for muscle spasms., Disp: 60 tablet, Rfl: 2   montelukast (SINGULAIR) 10 MG tablet, Take 1 tablet (10 mg total) by mouth at bedtime., Disp: 30 tablet, Rfl: 5   Multiple Vitamin (MULTIVITAMIN) capsule, Take 1 capsule by mouth daily., Disp: , Rfl:    omeprazole (PRILOSEC) 40 MG capsule, TAKE ONE CAPSULE BY MOUTH DAILY BEFORE BREAKFAST DAILY (Patient taking differently: Take 40 mg by mouth daily. TAKE ONE CAPSULE BY MOUTH DAILY BEFORE BREAKFAST DAILY), Disp: 30 capsule, Rfl: 4   OVER THE COUNTER MEDICATION, Place 1 application into both eyes at bedtime. Genteal overnight eye ointment, Disp: , Rfl:    polyethylene glycol powder (GLYCOLAX/MIRALAX) 17 GM/SCOOP powder, Miralax 17 gram/dose oral powder, Disp: , Rfl:    predniSONE (DELTASONE) 20 MG tablet, Take 1 tablet (20 mg total) by mouth daily with breakfast., Disp: 30 tablet, Rfl: 0   predniSONE (DELTASONE) 5 MG tablet, Take 1 tablet (5 mg total) by mouth daily with breakfast., Disp: 30 tablet, Rfl: 2   PRESCRIPTION MEDICATION, Place 1 spray into the nose in the morning and at bedtime. Budesonide 0.5 mg / 2 mL Nasal rinse, Disp: , Rfl:    pseudoephedrine (SUDAFED) 30 MG tablet, Take 30 mg by mouth every 4 (four) hours as needed for congestion., Disp: , Rfl:    traZODone (DESYREL) 150 MG tablet, TAKE ONE TABLET BY MOUTH DAILY, Disp: 90 tablet, Rfl: 0   valACYclovir (VALTREX) 1000 MG tablet, Take 1,000 mg by mouth daily. , Disp: , Rfl:    vitamin C (ASCORBIC ACID) 500 MG tablet, Take 500 mg by mouth daily., Disp: , Rfl:    Review of  Systems:   Review of Systems  Constitutional:  Negative for fever.  HENT:  Negative for congestion, sinus pain and sore throat.   Respiratory:  Negative for cough, shortness of breath and wheezing.   Cardiovascular:  Negative for chest pain and palpitations.  Gastrointestinal:  Negative for blood in stool, constipation, diarrhea, nausea and vomiting.  Genitourinary:  Negative for dysuria, frequency and hematuria.  Musculoskeletal:  Positive for back pain and joint pain.    Vitals:   Vitals:   04/27/22 1312  BP: (!) 150/90  Pulse: 68  Temp: (!) 97.5 F (36.4 C)  TempSrc: Temporal  SpO2: 98%  Weight: 181 lb 4 oz (82.2 kg)  Height: 5' 6.5" (1.689 m)     Body mass index is 28.82 kg/m.  Physical Exam:   Physical Exam Vitals and nursing note reviewed.  Constitutional:      General: She is not in acute distress.    Appearance: She is well-developed. She is not ill-appearing or toxic-appearing.  Cardiovascular:     Rate and  Rhythm: Normal rate and regular rhythm.     Pulses: Normal pulses.     Heart sounds: Normal heart sounds, S1 normal and S2 normal.  Pulmonary:     Effort: Pulmonary effort is normal.     Breath sounds: Normal breath sounds.  Skin:    General: Skin is warm and dry.  Neurological:     Mental Status: She is alert.     GCS: GCS eye subscore is 4. GCS verbal subscore is 5. GCS motor subscore is 6.  Psychiatric:        Speech: Speech normal.        Behavior: Behavior normal. Behavior is cooperative.     Assessment and Plan:   Essential (primary) hypertension Above goal  No evidence of end-organ damage  Will increase amlodipine to 10 mg daily, continue irbesartan 300 mg daily and lasix 10 mg Continue to monitor home BP  Overweight Continue efforts at healthy eating and exercise as able Declines RD referral Consider PREP program at [facility]  Anxiety Overall controlled Continue lexapro 20 mg daily Follow-up in 6 months, sooner if concerns  Lumbar  radiculopathy; Rheumatoid arthritis involving vertebra with positive rheumatoid factor ([place]) Uncontrolled pain Provided temporary rx of 20 mg prednisone for prn use for flares while she awaits Rinvoq Follow-up with rheumatology as planned  I,Alexis Herring,acting as a scribe for [employer], PA.,have documented all relevant documentation on the behalf of Inda Coke, PA,as directed by  Inda Coke, PA while in the presence of Inda Coke, [place].  I, Inda Coke, [place], have reviewed all documentation for this visit. The documentation on 04/27/22 for the exam, diagnosis, procedures, and orders are all accurate and complete.   Inda Coke, PA-C

## 2022-04-27 NOTE — Patient Instructions (Signed)
It was great to see you!  For your blood pressure -- increase your amlodopine to 10 mg daily. Ask your allergist about your Sudafed!  Trial 5-7 days of 20 mg prednisone for your flare  Follow-up in 6 months, sooner if concerns  Take care,  Inda Coke PA-C

## 2022-04-29 ENCOUNTER — Other Ambulatory Visit (HOSPITAL_COMMUNITY): Payer: Self-pay

## 2022-04-29 MED ORDER — PREDNISONE 5 MG PO TABS: 5.0000 mg | ORAL_TABLET | Freq: Every day | ORAL | 1 refills | Status: DC

## 2022-04-29 NOTE — Telephone Encounter (Signed)
Patient advised Dr. Benjamine Mola sent new Rx for prednisone 5 mg tablets she can continue low dose steroid daily for symptoms while waiting for new treatment to get started. Patient states her PCP put her on a prednisone taper. She states "5 mg will not sustain me".

## 2022-04-29 NOTE — Telephone Encounter (Signed)
I sent new Rx for prednisone 5 mg tablets she can continue low dose steroid daily for symptoms while waiting for new treatment to get started.

## 2022-04-29 NOTE — Telephone Encounter (Signed)
That is good she can see if the prednisone taper gets symptoms under better control again. If she completes that and then symptoms flare back up on the low dose prednisone she should give [place] an update.

## 2022-05-02 ENCOUNTER — Telehealth: Payer: Self-pay | Admitting: Physician Assistant

## 2022-05-02 NOTE — Telephone Encounter (Signed)
Received a fax from  [place] regarding an approval for [facility] patient assistance from 04/30/2022 to 04/18/2023. Approval letter sent to scan center.  Phone number: [phone]  Patient notified via MyChart  Knox Saliva, PharmD, MPH, BCPS, CPP Clinical Pharmacist (Rheumatology and Pulmonology)

## 2022-05-02 NOTE — Telephone Encounter (Signed)
Patient advised that is good she can see if the prednisone taper gets symptoms under better control again. If she completes that and then symptoms flare back up on the low dose prednisone she should give [place] an update. Patient expressed understanding.

## 2022-05-02 NOTE — Telephone Encounter (Signed)
..  Type of form received: Handicap placard  Additional comments:   Received by: Adonis Brook  Form should be Faxed to:  Form should be mailed to:    Is patient requesting call for pickup: yes   Form placed:  In provider's box  Attach charge sheet. yes  Individual made aware of 3-5 business day turn around (Y/N)?  yes

## 2022-05-03 DIAGNOSIS — J4531 Mild persistent asthma with (acute) exacerbation: Secondary | ICD-10-CM | POA: Diagnosis not present

## 2022-05-03 DIAGNOSIS — J3089 Other allergic rhinitis: Secondary | ICD-10-CM | POA: Diagnosis not present

## 2022-05-04 NOTE — Telephone Encounter (Signed)
Handicap Placard form completed and signed.

## 2022-05-04 NOTE — Telephone Encounter (Signed)
Spoke to pt told her Handicap form is ready for pickup, will put at the front desk. Pt verbalized understanding.

## 2022-05-05 ENCOUNTER — Telehealth: Payer: Self-pay | Admitting: Pharmacist

## 2022-05-05 ENCOUNTER — Other Ambulatory Visit: Payer: Self-pay

## 2022-05-05 ENCOUNTER — Other Ambulatory Visit (HOSPITAL_COMMUNITY): Payer: Self-pay

## 2022-05-05 DIAGNOSIS — M810 Age-related osteoporosis without current pathological fracture: Secondary | ICD-10-CM

## 2022-05-05 MED ORDER — DENOSUMAB 60 MG/ML ~~LOC~~ SOSY
60.0000 mg | PREFILLED_SYRINGE | SUBCUTANEOUS | 0 refills | Status: DC
  Filled 2022-05-05: qty 1, 180d supply, fill #0

## 2022-05-05 NOTE — Telephone Encounter (Signed)
Patient due for Prolia on 04/30/2022. Rx sent to [facility]. Patient aware of increase in copay.  Prolia appt scheduled for 05/11/2022  Knox Saliva, PharmD, MPH, BCPS, CPP Clinical Pharmacist (Rheumatology and Pulmonology)

## 2022-05-09 DIAGNOSIS — M791 Myalgia, unspecified site: Secondary | ICD-10-CM | POA: Diagnosis not present

## 2022-05-09 DIAGNOSIS — G8929 Other chronic pain: Secondary | ICD-10-CM | POA: Diagnosis not present

## 2022-05-10 NOTE — Progress Notes (Unsigned)
Pharmacy Note  Subjective:   Patient presents to clinic today to receive bi-annual dose of Prolia. Patient's last dose of Prolia was on 11/01/2021.  Patient running a fever or have signs/symptoms of infection? {yes/no:20286}  Patient currently on antibiotics for the treatment of infection? {yes/no:20286}  Patient had fall in the last 6 months?  {yes/no:20286}  If yes, did it require medical attention? {yes/no:20286}   Patient taking calcium 1200 mg daily through diet or supplement and at least 800 units vitamin D? {yes/no:20286}  Objective: CMP     Component Value Date/Time   NA 142 03/16/2022 1517   NA 139 04/05/2021 0000   K 4.8 03/16/2022 1517   CL 101 03/16/2022 1517   CO2 32 03/16/2022 1517   GLUCOSE 130 03/16/2022 1517   BUN 20 03/16/2022 1517   BUN 13 04/05/2021 0000   CREATININE 0.94 03/16/2022 1517   CALCIUM 10.1 03/16/2022 1517   PROT 6.9 03/16/2022 1517   ALBUMIN 4.7 10/25/2021 1328   AST 20 03/16/2022 1517   ALT 23 03/16/2022 1517   ALKPHOS 30 (L) 10/25/2021 1328   BILITOT 0.5 03/16/2022 1517   GFRNONAA 63 04/05/2021 0000   GFRNONAA >60 05/27/2020 1333   GFRNONAA 57 (L) 04/13/2017 1201   GFRAA >60 11/27/2019 0244   GFRAA 66 04/13/2017 1201    CBC    Component Value Date/Time   WBC 8.5 03/16/2022 1517   RBC 3.90 03/16/2022 1517   HGB 13.6 03/16/2022 1517   HCT 39.6 03/16/2022 1517   PLT 256 03/16/2022 1517   MCV 101.5 (H) 03/16/2022 1517   MCH 34.9 (H) 03/16/2022 1517   MCHC 34.3 03/16/2022 1517   RDW 11.5 03/16/2022 1517   LYMPHSABS 723 (L) 03/16/2022 1517   MONOABS 0.8 07/29/2020 1100   EOSABS 34 03/16/2022 1517   BASOSABS 60 03/16/2022 1517    Lab Results  Component Value Date   VD25OH 53 10/18/2021    T-score (11/10/2021): -1.1  Assessment/Plan:   Reviewed importance of adequate dietary intake of calcium in addition to supplementation due to risk of hypocalcemia with Prolia.   Patient tolerated injection  ***.   Patient is to return  in 10-14 days for labs to monitor for hypocalcemia.  Future orders placed.  Patient's next Prolia dose is due on 11/08/2022.  Patient is due for updated DEXA in ***.   All questions encouraged and answered.  Instructed patient to call with any further questions or concerns.  Maryan Puls, PharmD PGY-1 [facility] Pharmacy Resident

## 2022-05-11 ENCOUNTER — Ambulatory Visit: Payer: Medicare Other | Attending: Internal Medicine | Admitting: Pharmacist

## 2022-05-11 DIAGNOSIS — M810 Age-related osteoporosis without current pathological fracture: Secondary | ICD-10-CM | POA: Diagnosis not present

## 2022-05-11 MED ORDER — DENOSUMAB 60 MG/ML ~~LOC~~ SOSY
60.0000 mg | PREFILLED_SYRINGE | Freq: Once | SUBCUTANEOUS | Status: AC
  Administered 2022-05-11: 60 mg via SUBCUTANEOUS

## 2022-05-13 ENCOUNTER — Encounter: Payer: Self-pay | Admitting: Physician Assistant

## 2022-05-13 DIAGNOSIS — J45901 Unspecified asthma with (acute) exacerbation: Secondary | ICD-10-CM | POA: Diagnosis not present

## 2022-05-19 DIAGNOSIS — U071 COVID-19: Secondary | ICD-10-CM

## 2022-05-19 HISTORY — DX: COVID-19: U07.1

## 2022-05-20 ENCOUNTER — Other Ambulatory Visit (HOSPITAL_BASED_OUTPATIENT_CLINIC_OR_DEPARTMENT_OTHER): Payer: Self-pay

## 2022-05-20 ENCOUNTER — Telehealth (INDEPENDENT_AMBULATORY_CARE_PROVIDER_SITE_OTHER): Payer: Medicare Other | Admitting: Physician Assistant

## 2022-05-20 ENCOUNTER — Other Ambulatory Visit: Payer: Self-pay | Admitting: Physician Assistant

## 2022-05-20 ENCOUNTER — Encounter: Payer: Self-pay | Admitting: Physician Assistant

## 2022-05-20 DIAGNOSIS — U071 COVID-19: Secondary | ICD-10-CM | POA: Diagnosis not present

## 2022-05-20 MED ORDER — PREDNISONE 20 MG PO TABS
40.0000 mg | ORAL_TABLET | Freq: Every day | ORAL | 0 refills | Status: DC
Start: 2022-05-20 — End: 2022-06-15

## 2022-05-20 MED ORDER — MOLNUPIRAVIR EUA 200MG CAPSULE
4.0000 | ORAL_CAPSULE | Freq: Two times a day (BID) | ORAL | 0 refills | Status: AC
Start: 2022-05-20 — End: 2022-05-25
  Filled 2022-05-20: qty 40, 5d supply, fill #0

## 2022-05-20 NOTE — Progress Notes (Signed)
Virtual Visit via Video   I connected with Vanessa Harmon on 05/20/22 at 11:00 AM EST by a video enabled telemedicine application and verified that I am speaking with the correct person using two identifiers. Location patient: Home Location provider: [facility], Office Persons participating in the virtual visit: Tanith, Dagostino PA-C  I discussed the limitations of evaluation and management by telemedicine and the availability of in person appointments. The patient expressed understanding and agreed to proceed.   Subjective:   HPI:   COVID-19 Positive Symptom onset: Yesterday Travel/contacts: Husband is currently positive Vaccination status: Has had at least 5 shots Testing results: Tested positive today  Patient endorses the following symptoms: Cough, nasal congestion, fatigue, body aches  Patient denies the following symptoms: chest pain, SOB  Treatments tried: Over-the-counter medications such as DayQuil, Mucinex, as needed albuterol  She has a history of moderate persistent asthma and uses Flovent twice daily and as needed albuterol.  ROS: See pertinent positives and negatives per HPI.  Patient Active Problem List   Diagnosis Date Noted   Medial epicondylitis of left elbow 03/16/2022   High risk medication use 09/10/2021   Posterior vitreous detachment of left eye 09/29/2020   Thoracic spondylosis with cord compression 05/29/2020   Heterotopic ossification 03/30/2020   Loosening of hardware in spine ([place]) 01/27/2020   Posterior vitreous detachment of right eye 01/21/2020   Pseudophakia 01/21/2020   Horseshoe retinal tear of right eye 01/21/2020   Body mass index (BMI) 28.0-28.9, adult 11/27/2019   Left knee OA 11/26/2019   Numbness of foot 02/07/2019   Seropositive rheumatoid arthritis ([place]) 01/25/2019   Peripheral polyneuropathy 01/14/2019   Spinal stenosis, thoracolumbar region 01/14/2019   Thoracogenic scoliosis, thoracolumbar region  01/14/2019   Idiopathic scoliosis of thoracolumbar region 02/15/2018   Rhinitis, chronic 05/16/2017   Moderate persistent asthma without complication 23/53/6144   Polymyalgia rheumatica ([place]) 02/15/2015   DJD (degenerative joint disease) 04/15/2014   Lumbar spine scoliosis 11/28/2013   Degenerative spondylolisthesis 10/09/2013   Lumbar radiculopathy 09/16/2013   Lumbosacral radiculitis 07/31/2013   Scoliosis, or kyphoscoliosis, idiopathic 07/31/2013   Lumbar back pain 12/19/2011   Hyperlipidemia 10/16/2010   Depression 10/16/2010   Lumbar spondylosis 10/16/2010   Gastroesophageal reflux disease 10/16/2010   Adenomatous polyp of colon 10/16/2010   History of paroxysmal supraventricular tachycardia 10/16/2010   Essential (primary) hypertension 10/16/2010    Social History   Tobacco Use   Smoking status: Former    Packs/day: 1.00    Years: 20.00    Total pack years: 20.00    Types: Cigarettes    Quit date: 1998    Years since quitting: 26.1    Passive exposure: Never   Smokeless tobacco: Never  Substance Use Topics   Alcohol use: Yes    Comment: rarely with dinner    Current Outpatient Medications:    albuterol (PROAIR HFA) 108 (90 Base) MCG/ACT inhaler, Inhale two puffs every 4-6 hours if needed for cough or wheeze., Disp: 8.5 Inhaler, Rfl: 1   amLODipine (NORVASC) 10 MG tablet, Take 1 tablet (10 mg total) by mouth daily., Disp: 90 tablet, Rfl: 1   aspirin EC 81 MG tablet, Take 1 tablet (81 mg total) by mouth daily. Swallow whole., Disp: 30 tablet, Rfl: 11   azelastine (ASTELIN) 0.1 % nasal spray, Place 1 spray into both nostrils 2 (two) times daily. Use in each nostril as directed, Disp: , Rfl:    Bempedoic Acid (NEXLETOL) 180 MG TABS, Take  1 Dose by mouth daily., Disp: 90 tablet, Rfl: 3   betamethasone dipropionate 0.05 % cream, SMARTSIG:1 sparingly Topical Twice Daily, Disp: , Rfl:    budesonide (PULMICORT) 0.5 MG/2ML nebulizer solution, Take by nebulization., Disp: ,  Rfl:    Calcium Carb-Cholecalciferol (CALCIUM 600/VITAMIN D3 PO), Take 1 tablet by mouth daily., Disp: , Rfl:    carboxymethylcellulose (REFRESH PLUS) 0.5 % SOLN, Place 1 drop into both eyes 3 (three) times daily as needed (dry eyes). , Disp: , Rfl:    Cetirizine HCl (ZYRTEC ALLERGY) 10 MG CAPS, Zyrtec 10 mg capsule, Disp: , Rfl:    Cholecalciferol 125 MCG (5000 UT) TABS, Vitamin D3 125 mcg (5,000 unit) tablet  Take 1 tablet every day by oral route., Disp: , Rfl:    denosumab (PROLIA) 60 MG/ML SOSY injection, Inject 60 mg into the skin every 6 (six) months. Courier to rheum: [address] [place], [place] [address]. Appt on 05/11/22, Disp: 1 mL, Rfl: 0   diphenhydrAMINE (BENADRYL) 25 MG tablet, Take 50 mg by mouth daily as needed for allergies., Disp: , Rfl:    docusate sodium (COLACE) 100 MG capsule, , Disp: , Rfl:    escitalopram (LEXAPRO) 20 MG tablet, Take 1 tablet (20 mg total) by mouth daily., Disp: 90 tablet, Rfl: 3   Evolocumab (REPATHA SURECLICK) 628 MG/ML SOAJ, Inject 1 Dose into the skin every 14 (fourteen) days., Disp: 6 mL, Rfl: 3   ezetimibe (ZETIA) 10 MG tablet, TAKE ONE TABLET BY MOUTH DAILY, Disp: 90 tablet, Rfl: 1   famotidine (PEPCID) 40 MG tablet, TAKE 1 TABLET (40 MG TOTAL) BY MOUTH AT BEDTIME., Disp: 30 tablet, Rfl: 5   fluticasone (FLOVENT HFA) 220 MCG/ACT inhaler, Inhale 2 puffs into the lungs 2 (two) times daily., Disp: , Rfl:    furosemide (LASIX) 20 MG tablet, Take 1 tablet (20 mg total) by mouth daily., Disp: 90 tablet, Rfl: 1   gabapentin (NEURONTIN) 300 MG capsule, Take 1 capsule (300 mg total) by mouth at bedtime., Disp: 90 capsule, Rfl: 3   guaiFENesin (MUCINEX) 600 MG 12 hr tablet, Take 600 mg by mouth 2 (two) times daily as needed for cough or to loosen phlegm., Disp: , Rfl:    HYDROcodone-acetaminophen (NORCO) 10-325 MG tablet, 1 tablet as needed, Disp: , Rfl:    indomethacin (INDOCIN SR) 75 MG CR capsule, TAKE ONE CAPSULE BY MOUTH DAILY WITH BREAKFAST,  Disp: 90 capsule, Rfl: 0   ipratropium (ATROVENT) 0.03 % nasal spray, Place 1 spray into both nostrils 2 (two) times daily as needed for rhinitis., Disp: , Rfl:    irbesartan (AVAPRO) 300 MG tablet, TAKE ONE TABLET BY MOUTH DAILY, Disp: 90 tablet, Rfl: 0   Lifitegrast (XIIDRA) 5 % SOLN, Place 1 drop into both eyes in the morning and at bedtime. , Disp: , Rfl:    methocarbamol (ROBAXIN) 500 MG tablet, Take 1 tablet (500 mg total) by mouth every 6 (six) hours as needed for muscle spasms., Disp: 60 tablet, Rfl: 2   molnupiravir EUA (LAGEVRIO) 200 mg CAPS capsule, Take 4 capsules (800 mg total) by mouth 2 (two) times daily for 5 days., Disp: 40 capsule, Rfl: 0   montelukast (SINGULAIR) 10 MG tablet, Take 1 tablet (10 mg total) by mouth at bedtime., Disp: 30 tablet, Rfl: 5   Multiple Vitamin (MULTIVITAMIN) capsule, Take 1 capsule by mouth daily., Disp: , Rfl:    omeprazole (PRILOSEC) 40 MG capsule, TAKE ONE CAPSULE BY MOUTH DAILY BEFORE BREAKFAST DAILY (  Patient taking differently: Take 40 mg by mouth daily. TAKE ONE CAPSULE BY MOUTH DAILY BEFORE BREAKFAST DAILY), Disp: 30 capsule, Rfl: 4   OVER THE COUNTER MEDICATION, Place 1 application into both eyes at bedtime. Genteal overnight eye ointment, Disp: , Rfl:    polyethylene glycol powder (GLYCOLAX/MIRALAX) 17 GM/SCOOP powder, Miralax 17 gram/dose oral powder, Disp: , Rfl:    predniSONE (DELTASONE) 20 MG tablet, Take 1 tablet (20 mg total) by mouth daily with breakfast., Disp: 30 tablet, Rfl: 0   predniSONE (DELTASONE) 20 MG tablet, Take 2 tablets (40 mg total) by mouth daily., Disp: 10 tablet, Rfl: 0   predniSONE (DELTASONE) 5 MG tablet, Take 1 tablet (5 mg total) by mouth daily with breakfast., Disp: 30 tablet, Rfl: 1   PRESCRIPTION MEDICATION, Place 1 spray into the nose in the morning and at bedtime. Budesonide 0.5 mg / 2 mL Nasal rinse, Disp: , Rfl:    pseudoephedrine (SUDAFED) 30 MG tablet, Take 30 mg by mouth every 4 (four) hours as needed for  congestion., Disp: , Rfl:    traZODone (DESYREL) 150 MG tablet, TAKE ONE TABLET BY MOUTH DAILY, Disp: 90 tablet, Rfl: 0   valACYclovir (VALTREX) 1000 MG tablet, Take 1,000 mg by mouth daily. , Disp: , Rfl:    vitamin C (ASCORBIC ACID) 500 MG tablet, Take 500 mg by mouth daily., Disp: , Rfl:   Allergies  Allergen Reactions   Advair Hfa [Fluticasone-Salmeterol] Other (See Comments)    Hoarness   Statins Other (See Comments)   Breo Ellipta [Fluticasone Furoate-Vilanterol] Other (See Comments)    Severe hoarseness    Fosamax [Alendronate Sodium] Other (See Comments)    myalgias   Levaquin [Levofloxacin] Other (See Comments)    Lightheadedness, not feeling well, ear pain   Doxycycline Hyclate    Infliximab Other (See Comments)    Skin reaction   Spiriva Handihaler [Tiotropium Bromide Monohydrate]     UNSPECIFIED REACTION    Verapamil    Tape Rash    PAPER TAPE: Causes severe rash and weeping    Objective:   VITALS: Per patient if applicable, see vitals. GENERAL: Alert, appears well and in no acute distress. HEENT: Atraumatic, conjunctiva clear, no obvious abnormalities on inspection of external nose and ears. NECK: Normal movements of the head and neck. CARDIOPULMONARY: No increased WOB. Speaking in clear sentences. I:E ratio WNL.  MS: Moves all visible extremities without noticeable abnormality. PSYCH: Pleasant and cooperative, well-groomed. Speech normal rate and rhythm. Affect is appropriate. Insight and judgement are appropriate. Attention is focused, linear, and appropriate.  NEURO: CN grossly intact. Oriented as arrived to appointment on time with no prompting. Moves both UE equally.  SKIN: No obvious lesions, wounds, erythema, or cyanosis noted on face or hands.  Assessment and Plan:   Sareen was seen today for covid positive.  Diagnoses and all orders for this visit:  COVID-19   No red flags on discussion, patient is not in any obvious distress during our  visit. Discussed progression of most viral illnesses, and recommended supportive care at this point in time.   Given asthmatic history and multiple comorbidities, will start oral prednisone 40 mg daily x 5 days.  Also she is in the timeframe to take an antiviral, we are sending in molnupiravir given multiple potential medication interactions with Paxlovid.   Discussed over the counter supportive care options, including Tylenol 500 mg q 8 hours, with recommendations to push fluids and rest. Reviewed return precautions including new/worsening fever, SOB,  new/worsening cough, sudden onset changes of symptoms. Recommended need to self-quarantine and practice social distancing until symptoms resolve. I recommend that patient follow-up if symptoms worsen or persist despite treatment x 7-10 days, sooner if needed.  I discussed the assessment and treatment plan with the patient. The patient was provided an opportunity to ask questions and all were answered. The patient agreed with the plan and demonstrated an understanding of the instructions.   The patient was advised to call back or seek an in-person evaluation if the symptoms worsen or if the condition fails to improve as anticipated.    [place], [place] 05/20/2022

## 2022-05-23 ENCOUNTER — Encounter: Payer: Medicare Other | Admitting: Pharmacist

## 2022-05-26 ENCOUNTER — Telehealth: Payer: Self-pay

## 2022-05-26 NOTE — Telephone Encounter (Signed)
I recommend she wait until 10 days after the first positive COVID test as long as symptoms are improving during that time without complication.

## 2022-05-26 NOTE — Telephone Encounter (Signed)
Patient left message that she is positive covid and was given Renvog to start today. Would like to know if she hould wit until she is Covid free. Sent to provider for review.

## 2022-05-27 NOTE — Telephone Encounter (Signed)
I called patient, patient verbalized understanding. 

## 2022-06-08 ENCOUNTER — Encounter: Payer: Self-pay | Admitting: Physician Assistant

## 2022-06-08 ENCOUNTER — Other Ambulatory Visit: Payer: Self-pay | Admitting: Physician Assistant

## 2022-06-08 MED ORDER — AMOXICILLIN-POT CLAVULANATE 875-125 MG PO TABS
1.0000 | ORAL_TABLET | Freq: Two times a day (BID) | ORAL | 0 refills | Status: DC
Start: 2022-06-08 — End: 2022-06-15

## 2022-06-13 ENCOUNTER — Encounter: Payer: Self-pay | Admitting: Physician Assistant

## 2022-06-14 NOTE — Progress Notes (Signed)
Vanessa Harmon is a 72 y.o. female here for a new problem.  History of Present Illness:   No chief complaint on file.   HPI  Protracted Sinus Infection Tested positive for Covid-19 on 05/20/22.    Past Medical History:  Diagnosis Date   Adenomatous polyp    Allergy    Anxiety    takes Ativan daily as needed   Arthritis    inflammatory arthritis (Dr. Dagoberto Ligas)   Asthma    Cataract    Chronic back pain    scoliosis    Constipation    takes miralax every other day   Depression    takes Lexapro daily   Eczema    Essential hypertension, benign    takes Hyzaar and Metoprolol daily   Family history of adverse reaction to anesthesia    Sister PONV   Fibromyalgia    GERD (gastroesophageal reflux disease)    History of colon polyps    History of shingles    Hyperlipidemia    takes Atorvastatin daily   Insomnia    takes Melatonin nightly   Joint pain    Osteoarthritis    Osteoporosis    PSVT (paroxysmal supraventricular tachycardia)    Recurrent upper respiratory infection (URI)    Rheumatoid arthritis ([place])    sees Dr. Amil Amen; stable on Orencia   Rhinitis, allergic    uses Flonase daily   Urinary urgency    Urticaria      Social History   Tobacco Use   Smoking status: Former    Packs/day: 1.00    Years: 20.00    Total pack years: 20.00    Types: Cigarettes    Quit date: 1998    Years since quitting: 26.1    Passive exposure: Never   Smokeless tobacco: Never  Vaping Use   Vaping Use: Never used  Substance Use Topics   Alcohol use: Yes    Comment: rarely with dinner   Drug use: No    Past Surgical History:  Procedure Laterality Date   ADENOIDECTOMY     APPENDECTOMY     BACK SURGERY  2012   at age 49 d/t scoliosis   BLEPHAROPLASTY  2018   BREAST BIOPSY     cataract surgery     COLONOSCOPY     HARDWARE REMOVAL Left 02/24/2020   Procedure: HARDWARE REMOVAL LEFT DISTAL RADIUS;  Surgeon: Leanora Cover, MD;  Location: [facility];  Service: Orthopedics;  Laterality: Left;   KNEE ARTHROSCOPY Bilateral    neuroma removed from foot     unsure of which foot   OPEN REDUCTION INTERNAL FIXATION (ORIF) DISTAL RADIAL FRACTURE Left 08/15/2019   Procedure: OPEN REDUCTION INTERNAL FIXATION (ORIF) DISTAL RADIAL FRACTURE;  Surgeon: Leanora Cover, MD;  Location: [facility];  Service: Orthopedics;  Laterality: Left;  block in preop   POSTERIOR CERVICAL FUSION/FORAMINOTOMY N/A 05/29/2020   Procedure: Removal of hardware, Replacement of thoracic 8, thoracic 9, thoracic 10 screws, Right Thoracic 6-7, Right Thoracic 7-8 Laminectomies with thoracic hooks from Thoracic 4-5 to Thoracic 10;  Surgeon: Erline Levine, MD;  Location: [facility];  Service: Neurosurgery;  Laterality: N/A;   POSTERIOR LUMBAR FUSION 4 LEVEL N/A 02/15/2018   Procedure: Decompression and fusion Thoracic nine to Lumbar two with exploration of previous fusion;  Surgeon: Erline Levine, MD;  Location: [facility];  Service: Neurosurgery;  Laterality: N/A;   SHOULDER SURGERY Right    TONSILLECTOMY     TOTAL  HIP ARTHROPLASTY Right 04/15/2014   Procedure: RIGHT TOTAL HIP ARTHROPLASTY ANTERIOR APPROACH;  Surgeon: Renette Butters, MD;  Location: [facility];  Service: Orthopedics;  Laterality: Right;   TOTAL KNEE ARTHROPLASTY Left 11/26/2019   Procedure: TOTAL KNEE ARTHROPLASTY;  Surgeon: Paralee Cancel, MD;  Location: [facility];  Service: Orthopedics;  Laterality: Left;  70 mins    Family History  Problem Relation Age of Onset   Atrial fibrillation Mother    Stroke Mother    Allergic rhinitis Mother    Allergic rhinitis Father    AAA (abdominal aortic aneurysm) Father    COPD Father    Prostate cancer Father    Atrial fibrillation Brother    Hypertension Other        unspecified grandmother   AAA (abdominal aortic aneurysm) Other        unspecified grandfather   Cancer Other        unspecified grandfather   Neuropathy Neg Hx     Allergies  Allergen Reactions    Advair Hfa [Fluticasone-Salmeterol] Other (See Comments)    Hoarness   Statins Other (See Comments)   Breo Ellipta [Fluticasone Furoate-Vilanterol] Other (See Comments)    Severe hoarseness    Fosamax [Alendronate Sodium] Other (See Comments)    myalgias   Levaquin [Levofloxacin] Other (See Comments)    Lightheadedness, not feeling well, ear pain   Doxycycline Hyclate    Infliximab Other (See Comments)    Skin reaction   Spiriva Handihaler [Tiotropium Bromide Monohydrate]     UNSPECIFIED REACTION    Verapamil    Tape Rash    PAPER TAPE: Causes severe rash and weeping    Current Medications:   Current Outpatient Medications:    albuterol (PROAIR HFA) 108 (90 Base) MCG/ACT inhaler, Inhale two puffs every 4-6 hours if needed for cough or wheeze., Disp: 8.5 Inhaler, Rfl: 1   amLODipine (NORVASC) 10 MG tablet, Take 1 tablet (10 mg total) by mouth daily., Disp: 90 tablet, Rfl: 1   amoxicillin-clavulanate (AUGMENTIN) 875-125 MG tablet, Take 1 tablet by mouth 2 (two) times daily., Disp: 14 tablet, Rfl: 0   aspirin EC 81 MG tablet, Take 1 tablet (81 mg total) by mouth daily. Swallow whole., Disp: 30 tablet, Rfl: 11   azelastine (ASTELIN) 0.1 % nasal spray, Place 1 spray into both nostrils 2 (two) times daily. Use in each nostril as directed, Disp: , Rfl:    Bempedoic Acid (NEXLETOL) 180 MG TABS, Take 1 Dose by mouth daily., Disp: 90 tablet, Rfl: 3   betamethasone dipropionate 0.05 % cream, SMARTSIG:1 sparingly Topical Twice Daily, Disp: , Rfl:    budesonide (PULMICORT) 0.5 MG/2ML nebulizer solution, Take by nebulization., Disp: , Rfl:    Calcium Carb-Cholecalciferol (CALCIUM 600/VITAMIN D3 PO), Take 1 tablet by mouth daily., Disp: , Rfl:    carboxymethylcellulose (REFRESH PLUS) 0.5 % SOLN, Place 1 drop into both eyes 3 (three) times daily as needed (dry eyes). , Disp: , Rfl:    Cetirizine HCl (ZYRTEC ALLERGY) 10 MG CAPS, Zyrtec 10 mg capsule, Disp: , Rfl:    Cholecalciferol 125 MCG (5000 UT)  TABS, Vitamin D3 125 mcg (5,000 unit) tablet  Take 1 tablet every day by oral route., Disp: , Rfl:    denosumab (PROLIA) 60 MG/ML SOSY injection, Inject 60 mg into the skin every 6 (six) months. Courier to rheum: [address], [place], [place] [address]. Appt on 05/11/22, Disp: 1 mL, Rfl: 0   diphenhydrAMINE (BENADRYL) 25 MG tablet,  Take 50 mg by mouth daily as needed for allergies., Disp: , Rfl:    docusate sodium (COLACE) 100 MG capsule, , Disp: , Rfl:    escitalopram (LEXAPRO) 20 MG tablet, Take 1 tablet (20 mg total) by mouth daily., Disp: 90 tablet, Rfl: 3   Evolocumab (REPATHA SURECLICK) XX123456 MG/ML SOAJ, Inject 1 Dose into the skin every 14 (fourteen) days., Disp: 6 mL, Rfl: 3   ezetimibe (ZETIA) 10 MG tablet, TAKE ONE TABLET BY MOUTH DAILY, Disp: 90 tablet, Rfl: 1   famotidine (PEPCID) 40 MG tablet, TAKE 1 TABLET (40 MG TOTAL) BY MOUTH AT BEDTIME., Disp: 30 tablet, Rfl: 5   fluticasone (FLOVENT HFA) 220 MCG/ACT inhaler, Inhale 2 puffs into the lungs 2 (two) times daily., Disp: , Rfl:    furosemide (LASIX) 20 MG tablet, Take 1 tablet (20 mg total) by mouth daily., Disp: 90 tablet, Rfl: 1   gabapentin (NEURONTIN) 300 MG capsule, Take 1 capsule (300 mg total) by mouth at bedtime., Disp: 90 capsule, Rfl: 3   guaiFENesin (MUCINEX) 600 MG 12 hr tablet, Take 600 mg by mouth 2 (two) times daily as needed for cough or to loosen phlegm., Disp: , Rfl:    HYDROcodone-acetaminophen (NORCO) 10-325 MG tablet, 1 tablet as needed, Disp: , Rfl:    indomethacin (INDOCIN SR) 75 MG CR capsule, TAKE ONE CAPSULE BY MOUTH DAILY WITH BREAKFAST, Disp: 90 capsule, Rfl: 0   ipratropium (ATROVENT) 0.03 % nasal spray, Place 1 spray into both nostrils 2 (two) times daily as needed for rhinitis., Disp: , Rfl:    irbesartan (AVAPRO) 300 MG tablet, TAKE ONE TABLET BY MOUTH DAILY, Disp: 90 tablet, Rfl: 0   Lifitegrast (XIIDRA) 5 % SOLN, Place 1 drop into both eyes in the morning and at bedtime. , Disp: , Rfl:     methocarbamol (ROBAXIN) 500 MG tablet, Take 1 tablet (500 mg total) by mouth every 6 (six) hours as needed for muscle spasms., Disp: 60 tablet, Rfl: 2   montelukast (SINGULAIR) 10 MG tablet, Take 1 tablet (10 mg total) by mouth at bedtime., Disp: 30 tablet, Rfl: 5   Multiple Vitamin (MULTIVITAMIN) capsule, Take 1 capsule by mouth daily., Disp: , Rfl:    omeprazole (PRILOSEC) 40 MG capsule, TAKE ONE CAPSULE BY MOUTH DAILY BEFORE BREAKFAST DAILY (Patient taking differently: Take 40 mg by mouth daily. TAKE ONE CAPSULE BY MOUTH DAILY BEFORE BREAKFAST DAILY), Disp: 30 capsule, Rfl: 4   OVER THE COUNTER MEDICATION, Place 1 application into both eyes at bedtime. Genteal overnight eye ointment, Disp: , Rfl:    polyethylene glycol powder (GLYCOLAX/MIRALAX) 17 GM/SCOOP powder, Miralax 17 gram/dose oral powder, Disp: , Rfl:    predniSONE (DELTASONE) 20 MG tablet, Take 1 tablet (20 mg total) by mouth daily with breakfast., Disp: 30 tablet, Rfl: 0   predniSONE (DELTASONE) 20 MG tablet, Take 2 tablets (40 mg total) by mouth daily., Disp: 10 tablet, Rfl: 0   predniSONE (DELTASONE) 5 MG tablet, Take 1 tablet (5 mg total) by mouth daily with breakfast., Disp: 30 tablet, Rfl: 1   PRESCRIPTION MEDICATION, Place 1 spray into the nose in the morning and at bedtime. Budesonide 0.5 mg / 2 mL Nasal rinse, Disp: , Rfl:    pseudoephedrine (SUDAFED) 30 MG tablet, Take 30 mg by mouth every 4 (four) hours as needed for congestion., Disp: , Rfl:    traZODone (DESYREL) 150 MG tablet, TAKE ONE TABLET BY MOUTH DAILY, Disp: 90 tablet, Rfl: 0   valACYclovir (VALTREX) 1000  MG tablet, Take 1,000 mg by mouth daily. , Disp: , Rfl:    vitamin C (ASCORBIC ACID) 500 MG tablet, Take 500 mg by mouth daily., Disp: , Rfl:    Review of Systems:   ROS  Vitals:   There were no vitals filed for this visit.   There is no height or weight on file to calculate BMI.  Physical Exam:   Physical Exam  Assessment and Plan:   There are no  diagnoses linked to this encounter.   Inda Coke, PA-C

## 2022-06-15 ENCOUNTER — Ambulatory Visit (INDEPENDENT_AMBULATORY_CARE_PROVIDER_SITE_OTHER): Payer: Medicare Other | Admitting: Physician Assistant

## 2022-06-15 ENCOUNTER — Encounter: Payer: Self-pay | Admitting: Physician Assistant

## 2022-06-15 ENCOUNTER — Ambulatory Visit: Payer: Medicare Other | Admitting: Internal Medicine

## 2022-06-15 VITALS — BP 136/80 | HR 83 | Temp 97.5°F | Ht 66.5 in | Wt 187.2 lb

## 2022-06-15 DIAGNOSIS — U099 Post covid-19 condition, unspecified: Secondary | ICD-10-CM

## 2022-06-15 DIAGNOSIS — E559 Vitamin D deficiency, unspecified: Secondary | ICD-10-CM

## 2022-06-15 DIAGNOSIS — R5383 Other fatigue: Secondary | ICD-10-CM

## 2022-06-15 LAB — COMPREHENSIVE METABOLIC PANEL
ALT: 18 U/L (ref 0–35)
AST: 21 U/L (ref 0–37)
Albumin: 4.1 g/dL (ref 3.5–5.2)
Alkaline Phosphatase: 40 U/L (ref 39–117)
BUN: 25 mg/dL — ABNORMAL HIGH (ref 6–23)
CO2: 30 mEq/L (ref 19–32)
Calcium: 9.9 mg/dL (ref 8.4–10.5)
Chloride: 99 mEq/L (ref 96–112)
Creatinine, Ser: 1.18 mg/dL (ref 0.40–1.20)
GFR: 45.95 mL/min — ABNORMAL LOW (ref >60.00)
Glucose, Bld: 131 mg/dL — ABNORMAL HIGH (ref 70–99)
Potassium: 4.5 mEq/L (ref 3.5–5.1)
Sodium: 138 mEq/L (ref 135–145)
Total Bilirubin: 0.5 mg/dL (ref 0.2–1.2)
Total Protein: 6.5 g/dL (ref 6.0–8.3)

## 2022-06-15 LAB — CBC WITH DIFFERENTIAL/PLATELET
Basophils Absolute: 0.1 10*3/uL (ref 0.0–0.1)
Basophils Relative: 0.5 % (ref 0.0–3.0)
Eosinophils Absolute: 0.1 10*3/uL (ref 0.0–0.7)
Eosinophils Relative: 1 % (ref 0.0–5.0)
HCT: 36.6 % (ref 36.0–46.0)
Hemoglobin: 12.2 g/dL (ref 12.0–15.0)
Lymphocytes Relative: 10.4 % — ABNORMAL LOW (ref 12.0–46.0)
Lymphs Abs: 1.2 10*3/uL (ref 0.7–4.0)
MCHC: 33.2 g/dL (ref 30.0–36.0)
MCV: 99.7 fl (ref 78.0–100.0)
Monocytes Absolute: 0.8 10*3/uL (ref 0.1–1.0)
Monocytes Relative: 7.5 % (ref 3.0–12.0)
Neutro Abs: 9 10*3/uL — ABNORMAL HIGH (ref 1.4–7.7)
Neutrophils Relative %: 80.6 % — ABNORMAL HIGH (ref 43.0–77.0)
Platelets: 397 10*3/uL (ref 150.0–400.0)
RBC: 3.67 Mil/uL — ABNORMAL LOW (ref 3.87–5.11)
RDW: 13.4 % (ref 11.5–15.5)
WBC: 11.2 10*3/uL — ABNORMAL HIGH (ref 4.0–10.5)

## 2022-06-15 LAB — IBC + FERRITIN
Ferritin: 74.7 ng/mL (ref 10.0–291.0)
Iron: 79 ug/dL (ref 42–145)
Saturation Ratios: 21.5 % (ref 20.0–50.0)
TIBC: 366.8 ug/dL (ref 250.0–450.0)
Transferrin: 262 mg/dL (ref 212.0–360.0)

## 2022-06-15 LAB — URINALYSIS
Bilirubin Urine: NEGATIVE
Hgb urine dipstick: NEGATIVE
Ketones, ur: NEGATIVE
Leukocytes,Ua: NEGATIVE
Nitrite: NEGATIVE
Specific Gravity, Urine: 1.01 (ref 1.000–1.030)
Total Protein, Urine: NEGATIVE
Urine Glucose: NEGATIVE
Urobilinogen, UA: 0.2 (ref 0.0–1.0)
pH: 6 (ref 5.0–8.0)

## 2022-06-15 LAB — VITAMIN B12: Vitamin B-12: 333 pg/mL (ref 211–911)

## 2022-06-15 LAB — VITAMIN D 25 HYDROXY (VIT D DEFICIENCY, FRACTURES): VITD: 64.5 ng/mL (ref 30.00–100.00)

## 2022-06-15 NOTE — Patient Instructions (Signed)
It was great to see you!  Update blood work today  EKG looks overall stable  Keep an eye on your oxygen  Take care,  Inda Coke PA-C

## 2022-06-15 NOTE — Progress Notes (Unsigned)
Office Visit Note  Patient: Vanessa Harmon             Date of Birth: 12/15/1950           MRN: [id number]             PCP: Inda Coke, PA Referring: Inda Coke, PA Visit Date: 06/16/2022   Subjective:  No chief complaint on file.   History of Present Illness: Vanessa Harmon is a 72 y.o. female here for follow up ***   Previous HPI 03/16/22 Vanessa Harmon is a 72 y.o. female here for follow up for seropositive RA on prednisone 5 mg daily.  Methotrexate was discontinued due to worsening laboratory function tests and Actemra stopped due to developing palpable rashes throughout her legs similar to previous medication reaction to infliximab.  She is also noticed easier bruising than usual and has seen discoloration of her nails with some separation and cracking.  Prednisone is helping somewhat but still has ongoing joint pain and stiffness specially off of her previous medications.  In particular bothering her lately has been persistent pain along the medial side of her left elbow does not recall any specific injury to this area but does feel like there is a sense of swelling and pressure.   Previous HPI 12/14/21 Vanessa Harmon is a 72 y.o. female here for follow up for seropositive RA on methotrexate 15 mg subcu weekly prednisone 5 mg daily leucovorin 1 mg daily and after starting Actemra 162 mg [place] q. 14 days.  She feels like arthritis symptoms have been well controlled on her current medications.  No significant flareups.  She still gets swelling in her wrist but no other visible joint swelling.  She has had pretty extensive bruising and easy skin tearing on both arms often from playing with her dog.  Lab tests checked in July did show appears to be a small decrease in estimated GFR.   Previous HPI 09/10/2021 Vanessa Harmon is a 72 y.o. female here for evaluation of chronic back pain associated with seropositive RA she has been seeing [name] on treatment with  orencia IV, MTX 15 mg [place] weekly, leucovorin 1 mg daily, and prednisone 5 mg daily.  She was originally diagnosed with inflammatory arthritis by Dr. [name] about a decade ago.  Apparently the worst initial symptom at that time was left hip pain and inflammation with concerning findings on MRI.  Was started on methotrexate as a steroid sparing DMARD treatment.  Apparently had initial concern for seronegative rheumatoid arthritis versus PMR presentation.  She did not tolerate methotrexate well initially worst side effect was substantial hair loss.  Also did not tolerate well on leflunomide.  She was started on infliximab treatment developed a significant adverse reaction with new skin rashes in multiple areas and severe finger and toe nail changes that lasted about a year after discontinuing the medication.  She was started on IV Orencia monthly infusions which has been beneficial for symptoms but still notices some amount ongoing and that symptoms tend to worsen between each dosing interval.  She was restarted on methotrexate this time with leucovorin in the past few months and this has improved her joint inflammation further.  She remains on low-dose prednisone 5 mg daily throughout most of this time.  Most recent definite flareup of her rheumatoid arthritis was about 6 months ago. In addition to inflammatory joint disease she has significant amount of generalized osteoarthritis.  She reports bone-on-bone degenerative changes  with slightly impaired range of motion in the left shoulder.  She had previous left knee total arthroplasty.  She has degenerative arthritis at multiple levels in the spine with history of repeat lumbar spine surgery.  She has some spinal and foraminal stenosis with radicular symptoms.  She is also had recurrent problems with hip bursitis has been responsive to local steroid injection treatments with Dr. [name].  She takes gabapentin 300 mg at night and as needed Robaxin 500 mg for the ongoing  symptoms. She has not had any history with recurrent infections or major side effects on her current regimen.  She does have extensive bruising mostly worst in the past year and worst on extensor surfaces of arms and legs.  She denies any history of abnormal bleeding and blood clots. She has chronic dry eyes and sees Dr. [name] currently uses xiidra and genteal drops for this.     No Rheumatology ROS completed.   PMFS History:  Patient Active Problem List   Diagnosis Date Noted   Medial epicondylitis of left elbow 03/16/2022   High risk medication use 09/10/2021   Posterior vitreous detachment of left eye 09/29/2020   Thoracic spondylosis with cord compression 05/29/2020   Heterotopic ossification 03/30/2020   Loosening of hardware in spine ([place]) 01/27/2020   Posterior vitreous detachment of right eye 01/21/2020   Pseudophakia 01/21/2020   Horseshoe retinal tear of right eye 01/21/2020   Body mass index (BMI) 28.0-28.9, adult 11/27/2019   Left knee OA 11/26/2019   Numbness of foot 02/07/2019   Seropositive rheumatoid arthritis ([place]) 01/25/2019   Peripheral polyneuropathy 01/14/2019   Spinal stenosis, thoracolumbar region 01/14/2019   Thoracogenic scoliosis, thoracolumbar region 01/14/2019   Idiopathic scoliosis of thoracolumbar region 02/15/2018   Rhinitis, chronic 05/16/2017   Moderate persistent asthma without complication 123456   Polymyalgia rheumatica ([place]) 02/15/2015   DJD (degenerative joint disease) 04/15/2014   Lumbar spine scoliosis 11/28/2013   Degenerative spondylolisthesis 10/09/2013   Lumbar radiculopathy 09/16/2013   Lumbosacral radiculitis 07/31/2013   Scoliosis, or kyphoscoliosis, idiopathic 07/31/2013   Lumbar back pain 12/19/2011   Hyperlipidemia 10/16/2010   Depression 10/16/2010   Lumbar spondylosis 10/16/2010   Gastroesophageal reflux disease 10/16/2010   Adenomatous polyp of colon 10/16/2010   History of paroxysmal supraventricular tachycardia  10/16/2010   Essential (primary) hypertension 10/16/2010    Past Medical History:  Diagnosis Date   Adenomatous polyp    Allergy    Anxiety    takes Ativan daily as needed   Arthritis    inflammatory arthritis (Dr. Dagoberto Ligas)   Asthma    Cataract    Chronic back pain    scoliosis    Constipation    takes miralax every other day   Depression    takes Lexapro daily   Eczema    Essential hypertension, benign    takes Hyzaar and Metoprolol daily   Family history of adverse reaction to anesthesia    Sister PONV   Fibromyalgia    GERD (gastroesophageal reflux disease)    History of colon polyps    History of shingles    Hyperlipidemia    takes Atorvastatin daily   Insomnia    takes Melatonin nightly   Joint pain    Osteoarthritis    Osteoporosis    PSVT (paroxysmal supraventricular tachycardia)    Recurrent upper respiratory infection (URI)    Rheumatoid arthritis ([place])    sees Dr. Amil Amen; stable on Orencia   Rhinitis, allergic  uses Flonase daily   Urinary urgency    Urticaria     Family History  Problem Relation Age of Onset   Atrial fibrillation Mother    Stroke Mother    Allergic rhinitis Mother    Allergic rhinitis Father    AAA (abdominal aortic aneurysm) Father    COPD Father    Prostate cancer Father    Atrial fibrillation Brother    Hypertension Other        unspecified grandmother   AAA (abdominal aortic aneurysm) Other        unspecified grandfather   Cancer Other        unspecified grandfather   Neuropathy Neg Hx    Past Surgical History:  Procedure Laterality Date   ADENOIDECTOMY     APPENDECTOMY     BACK SURGERY  2012   at age 43 d/t scoliosis   BLEPHAROPLASTY  2018   BREAST BIOPSY     cataract surgery     COLONOSCOPY     HARDWARE REMOVAL Left 02/24/2020   Procedure: HARDWARE REMOVAL LEFT DISTAL RADIUS;  Surgeon: Leanora Cover, MD;  Location: [facility];  Service: Orthopedics;  Laterality: Left;   KNEE  ARTHROSCOPY Bilateral    neuroma removed from foot     unsure of which foot   OPEN REDUCTION INTERNAL FIXATION (ORIF) DISTAL RADIAL FRACTURE Left 08/15/2019   Procedure: OPEN REDUCTION INTERNAL FIXATION (ORIF) DISTAL RADIAL FRACTURE;  Surgeon: Leanora Cover, MD;  Location: [facility];  Service: Orthopedics;  Laterality: Left;  block in preop   POSTERIOR CERVICAL FUSION/FORAMINOTOMY N/A 05/29/2020   Procedure: Removal of hardware, Replacement of thoracic 8, thoracic 9, thoracic 10 screws, Right Thoracic 6-7, Right Thoracic 7-8 Laminectomies with thoracic hooks from Thoracic 4-5 to Thoracic 10;  Surgeon: Erline Levine, MD;  Location: [facility];  Service: Neurosurgery;  Laterality: N/A;   POSTERIOR LUMBAR FUSION 4 LEVEL N/A 02/15/2018   Procedure: Decompression and fusion Thoracic nine to Lumbar two with exploration of previous fusion;  Surgeon: Erline Levine, MD;  Location: [facility];  Service: Neurosurgery;  Laterality: N/A;   SHOULDER SURGERY Right    TONSILLECTOMY     TOTAL HIP ARTHROPLASTY Right 04/15/2014   Procedure: RIGHT TOTAL HIP ARTHROPLASTY ANTERIOR APPROACH;  Surgeon: Renette Butters, MD;  Location: [facility];  Service: Orthopedics;  Laterality: Right;   TOTAL KNEE ARTHROPLASTY Left 11/26/2019   Procedure: TOTAL KNEE ARTHROPLASTY;  Surgeon: Paralee Cancel, MD;  Location: [facility];  Service: Orthopedics;  Laterality: Left;  70 mins   Social History   Social History Narrative   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   Lives with husband   Immunization History  Administered Date(s) Administered   Influenza Split 01/31/2012   Influenza Whole 02/16/2010   Influenza, High Dose Seasonal PF 12/13/2018, 02/18/2022   Influenza,inj,Quad PF,6+ Mos 01/14/2014, 01/27/2015, 02/02/2016   Influenza,inj,quad, With Preservative 02/26/2020   Influenza-Unspecified 02/24/2021   Moderna Sars-Covid-2 Vaccination 02/20/2022   PFIZER(Purple Top)SARS-COV-2 Vaccination 05/25/2019, 06/15/2019, 02/05/2020, 08/03/2020    Pneumococcal Conjugate-13 01/27/2015   Pneumococcal Polysaccharide-23 05/26/2017   Tdap 10/04/2011   Zoster Recombinat (Shingrix) 10/13/2017, 01/02/2018     Objective: Vital Signs: There were no vitals taken for this visit.   Physical Exam   Musculoskeletal Exam: ***  CDAI Exam: CDAI Score: -- Patient Global: --; Provider Global: -- Swollen: --; Tender: -- Joint Exam 06/16/2022   No joint exam has been documented for this visit   There is currently no  information documented on the homunculus. Go to the Rheumatology activity and complete the homunculus joint exam.  Investigation: No additional findings.  Imaging: No results found.  Recent Labs: Lab Results  Component Value Date   WBC 11.2 (H) 06/15/2022   HGB 12.2 06/15/2022   PLT 397.0 06/15/2022   NA 138 06/15/2022   K 4.5 06/15/2022   CL 99 06/15/2022   CO2 30 06/15/2022   GLUCOSE 131 (H) 06/15/2022   BUN 25 (H) 06/15/2022   CREATININE 1.18 06/15/2022   BILITOT 0.5 06/15/2022   ALKPHOS 40 06/15/2022   AST 21 06/15/2022   ALT 18 06/15/2022   PROT 6.5 06/15/2022   ALBUMIN 4.1 06/15/2022   CALCIUM 9.9 06/15/2022   GFRAA >60 11/27/2019   QFTBGOLDPLUS NEGATIVE 09/10/2021    Speciality Comments: No specialty comments available.  Procedures:  No procedures performed Allergies: Advair hfa [fluticasone-salmeterol], Statins, Breo ellipta [fluticasone furoate-vilanterol], Fosamax [alendronate sodium], Levaquin [levofloxacin], Doxycycline hyclate, Infliximab, Spiriva handihaler [tiotropium bromide monohydrate], Verapamil, and Tape   Assessment / Plan:     Visit Diagnoses: No diagnosis found.  ***  Orders: No orders of the defined types were placed in this encounter.  No orders of the defined types were placed in this encounter.    Follow-Up Instructions: No follow-ups on file.   Collier Salina, MD  Note - This record has been created using [employer].  Chart creation errors have been  sought, but may not always  have been located. Such creation errors do not reflect on  the standard of medical care.

## 2022-06-15 NOTE — Progress Notes (Deleted)
Office Visit Note  Patient: Vanessa Harmon             Date of Birth: 04/21/1950           MRN: [id number]             PCP: Inda Coke, PA Referring: Inda Coke, PA Visit Date: 06/15/2022   Subjective:  No chief complaint on file.   History of Present Illness: Vanessa Harmon is a 72 y.o. female here for follow up ***   Previous HPI 03/16/22 Vanessa Harmon is a 72 y.o. female here for follow up for seropositive RA on prednisone 5 mg daily.  Methotrexate was discontinued due to worsening laboratory function tests and Actemra stopped due to developing palpable rashes throughout her legs similar to previous medication reaction to infliximab.  She is also noticed easier bruising than usual and has seen discoloration of her nails with some separation and cracking.  Prednisone is helping somewhat but still has ongoing joint pain and stiffness specially off of her previous medications.  In particular bothering her lately has been persistent pain along the medial side of her left elbow does not recall any specific injury to this area but does feel like there is a sense of swelling and pressure.   Previous HPI 12/14/21 Vanessa Harmon is a 72 y.o. female here for follow up for seropositive RA on methotrexate 15 mg subcu weekly prednisone 5 mg daily leucovorin 1 mg daily and after starting Actemra 162 mg [place] q. 14 days.  She feels like arthritis symptoms have been well controlled on her current medications.  No significant flareups.  She still gets swelling in her wrist but no other visible joint swelling.  She has had pretty extensive bruising and easy skin tearing on both arms often from playing with her dog.  Lab tests checked in July did show appears to be a small decrease in estimated GFR.   Previous HPI 09/10/2021 Vanessa Harmon is a 72 y.o. female here for evaluation of chronic back pain associated with seropositive RA she has been seeing [name] on treatment with  orencia IV, MTX 15 mg [place] weekly, leucovorin 1 mg daily, and prednisone 5 mg daily.  She was originally diagnosed with inflammatory arthritis by Dr. [name] about a decade ago.  Apparently the worst initial symptom at that time was left hip pain and inflammation with concerning findings on MRI.  Was started on methotrexate as a steroid sparing DMARD treatment.  Apparently had initial concern for seronegative rheumatoid arthritis versus PMR presentation.  She did not tolerate methotrexate well initially worst side effect was substantial hair loss.  Also did not tolerate well on leflunomide.  She was started on infliximab treatment developed a significant adverse reaction with new skin rashes in multiple areas and severe finger and toe nail changes that lasted about a year after discontinuing the medication.  She was started on IV Orencia monthly infusions which has been beneficial for symptoms but still notices some amount ongoing and that symptoms tend to worsen between each dosing interval.  She was restarted on methotrexate this time with leucovorin in the past few months and this has improved her joint inflammation further.  She remains on low-dose prednisone 5 mg daily throughout most of this time.  Most recent definite flareup of her rheumatoid arthritis was about 6 months ago. In addition to inflammatory joint disease she has significant amount of generalized osteoarthritis.  She reports bone-on-bone degenerative changes  with slightly impaired range of motion in the left shoulder.  She had previous left knee total arthroplasty.  She has degenerative arthritis at multiple levels in the spine with history of repeat lumbar spine surgery.  She has some spinal and foraminal stenosis with radicular symptoms.  She is also had recurrent problems with hip bursitis has been responsive to local steroid injection treatments with Dr. [name].  She takes gabapentin 300 mg at night and as needed Robaxin 500 mg for the ongoing  symptoms. She has not had any history with recurrent infections or major side effects on her current regimen.  She does have extensive bruising mostly worst in the past year and worst on extensor surfaces of arms and legs.  She denies any history of abnormal bleeding and blood clots. She has chronic dry eyes and sees Dr. [name] currently uses xiidra and genteal drops for this.     No Rheumatology ROS completed.   PMFS History:  Patient Active Problem List   Diagnosis Date Noted   Medial epicondylitis of left elbow 03/16/2022   High risk medication use 09/10/2021   Posterior vitreous detachment of left eye 09/29/2020   Thoracic spondylosis with cord compression 05/29/2020   Heterotopic ossification 03/30/2020   Loosening of hardware in spine ([place]) 01/27/2020   Posterior vitreous detachment of right eye 01/21/2020   Pseudophakia 01/21/2020   Horseshoe retinal tear of right eye 01/21/2020   Body mass index (BMI) 28.0-28.9, adult 11/27/2019   Left knee OA 11/26/2019   Numbness of foot 02/07/2019   Seropositive rheumatoid arthritis ([place]) 01/25/2019   Peripheral polyneuropathy 01/14/2019   Spinal stenosis, thoracolumbar region 01/14/2019   Thoracogenic scoliosis, thoracolumbar region 01/14/2019   Idiopathic scoliosis of thoracolumbar region 02/15/2018   Rhinitis, chronic 05/16/2017   Moderate persistent asthma without complication 123456   Polymyalgia rheumatica ([place]) 02/15/2015   DJD (degenerative joint disease) 04/15/2014   Lumbar spine scoliosis 11/28/2013   Degenerative spondylolisthesis 10/09/2013   Lumbar radiculopathy 09/16/2013   Lumbosacral radiculitis 07/31/2013   Scoliosis, or kyphoscoliosis, idiopathic 07/31/2013   Lumbar back pain 12/19/2011   Hyperlipidemia 10/16/2010   Depression 10/16/2010   Lumbar spondylosis 10/16/2010   Gastroesophageal reflux disease 10/16/2010   Adenomatous polyp of colon 10/16/2010   History of paroxysmal supraventricular tachycardia  10/16/2010   Essential (primary) hypertension 10/16/2010    Past Medical History:  Diagnosis Date   Adenomatous polyp    Allergy    Anxiety    takes Ativan daily as needed   Arthritis    inflammatory arthritis (Dr. Dagoberto Ligas)   Asthma    Cataract    Chronic back pain    scoliosis    Constipation    takes miralax every other day   Depression    takes Lexapro daily   Eczema    Essential hypertension, benign    takes Hyzaar and Metoprolol daily   Family history of adverse reaction to anesthesia    Sister PONV   Fibromyalgia    GERD (gastroesophageal reflux disease)    History of colon polyps    History of shingles    Hyperlipidemia    takes Atorvastatin daily   Insomnia    takes Melatonin nightly   Joint pain    Osteoarthritis    Osteoporosis    PSVT (paroxysmal supraventricular tachycardia)    Recurrent upper respiratory infection (URI)    Rheumatoid arthritis ([place])    sees Dr. Amil Amen; stable on Orencia   Rhinitis, allergic  uses Flonase daily   Urinary urgency    Urticaria     Family History  Problem Relation Age of Onset   Atrial fibrillation Mother    Stroke Mother    Allergic rhinitis Mother    Allergic rhinitis Father    AAA (abdominal aortic aneurysm) Father    COPD Father    Prostate cancer Father    Atrial fibrillation Brother    Hypertension Other        unspecified grandmother   AAA (abdominal aortic aneurysm) Other        unspecified grandfather   Cancer Other        unspecified grandfather   Neuropathy Neg Hx    Past Surgical History:  Procedure Laterality Date   ADENOIDECTOMY     APPENDECTOMY     BACK SURGERY  2012   at age 9 d/t scoliosis   BLEPHAROPLASTY  2018   BREAST BIOPSY     cataract surgery     COLONOSCOPY     HARDWARE REMOVAL Left 02/24/2020   Procedure: HARDWARE REMOVAL LEFT DISTAL RADIUS;  Surgeon: Leanora Cover, MD;  Location: [facility];  Service: Orthopedics;  Laterality: Left;   KNEE  ARTHROSCOPY Bilateral    neuroma removed from foot     unsure of which foot   OPEN REDUCTION INTERNAL FIXATION (ORIF) DISTAL RADIAL FRACTURE Left 08/15/2019   Procedure: OPEN REDUCTION INTERNAL FIXATION (ORIF) DISTAL RADIAL FRACTURE;  Surgeon: Leanora Cover, MD;  Location: [facility];  Service: Orthopedics;  Laterality: Left;  block in preop   POSTERIOR CERVICAL FUSION/FORAMINOTOMY N/A 05/29/2020   Procedure: Removal of hardware, Replacement of thoracic 8, thoracic 9, thoracic 10 screws, Right Thoracic 6-7, Right Thoracic 7-8 Laminectomies with thoracic hooks from Thoracic 4-5 to Thoracic 10;  Surgeon: Erline Levine, MD;  Location: [facility];  Service: Neurosurgery;  Laterality: N/A;   POSTERIOR LUMBAR FUSION 4 LEVEL N/A 02/15/2018   Procedure: Decompression and fusion Thoracic nine to Lumbar two with exploration of previous fusion;  Surgeon: Erline Levine, MD;  Location: [facility];  Service: Neurosurgery;  Laterality: N/A;   SHOULDER SURGERY Right    TONSILLECTOMY     TOTAL HIP ARTHROPLASTY Right 04/15/2014   Procedure: RIGHT TOTAL HIP ARTHROPLASTY ANTERIOR APPROACH;  Surgeon: Renette Butters, MD;  Location: [facility];  Service: Orthopedics;  Laterality: Right;   TOTAL KNEE ARTHROPLASTY Left 11/26/2019   Procedure: TOTAL KNEE ARTHROPLASTY;  Surgeon: Paralee Cancel, MD;  [facility];  Service: Orthopedics;  Laterality: Left;  70 mins   Social History   Social History Narrative   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   Lives with husband   Immunization History  Administered Date(s) Administered   Influenza Split 01/31/2012   Influenza Whole 02/16/2010   Influenza, High Dose Seasonal PF 12/13/2018, 02/18/2022   Influenza,inj,Quad PF,6+ Mos 01/14/2014, 01/27/2015, 02/02/2016   Influenza,inj,quad, With Preservative 02/26/2020   Influenza-Unspecified 02/24/2021   Moderna Sars-Covid-2 Vaccination 02/20/2022   PFIZER(Purple Top)SARS-COV-2 Vaccination 05/25/2019, 06/15/2019, 02/05/2020, 08/03/2020    Pneumococcal Conjugate-13 01/27/2015   Pneumococcal Polysaccharide-23 05/26/2017   Tdap 10/04/2011   Zoster Recombinat (Shingrix) 10/13/2017, 01/02/2018     Objective: Vital Signs: There were no vitals taken for this visit.   Physical Exam   Musculoskeletal Exam: ***  CDAI Exam: CDAI Score: -- Patient Global: --; Provider Global: -- Swollen: --; Tender: -- Joint Exam 06/15/2022   No joint exam has been documented for this visit   There is currently no  information documented on the homunculus. Go to the Rheumatology activity and complete the homunculus joint exam.  Investigation: No additional findings.  Imaging: No results found.  Recent Labs: Lab Results  Component Value Date   WBC 8.5 03/16/2022   HGB 13.6 03/16/2022   PLT 256 03/16/2022   NA 142 03/16/2022   K 4.8 03/16/2022   CL 101 03/16/2022   CO2 32 03/16/2022   GLUCOSE 130 03/16/2022   BUN 20 03/16/2022   CREATININE 0.94 03/16/2022   BILITOT 0.5 03/16/2022   ALKPHOS 30 (L) 10/25/2021   AST 20 03/16/2022   ALT 23 03/16/2022   PROT 6.9 03/16/2022   ALBUMIN 4.7 10/25/2021   CALCIUM 10.1 03/16/2022   GFRAA >60 11/27/2019   QFTBGOLDPLUS NEGATIVE 09/10/2021    Speciality Comments: No specialty comments available.  Procedures:  No procedures performed Allergies: Advair hfa [fluticasone-salmeterol], Statins, Breo ellipta [fluticasone furoate-vilanterol], Fosamax [alendronate sodium], Levaquin [levofloxacin], Doxycycline hyclate, Infliximab, Spiriva handihaler [tiotropium bromide monohydrate], Verapamil, and Tape   Assessment / Plan:     Visit Diagnoses: No diagnosis found.  ***  Orders: No orders of the defined types were placed in this encounter.  No orders of the defined types were placed in this encounter.    Follow-Up Instructions: No follow-ups on file.   Collier Salina, MD  Note - This record has been created using [employer].  Chart creation errors have been sought, but  may not always  have been located. Such creation errors do not reflect on  the standard of medical care.

## 2022-06-16 ENCOUNTER — Encounter: Payer: Self-pay | Admitting: Internal Medicine

## 2022-06-16 ENCOUNTER — Ambulatory Visit: Payer: Medicare Other | Attending: Internal Medicine | Admitting: Internal Medicine

## 2022-06-16 VITALS — BP 128/77 | HR 90 | Resp 12 | Ht 66.0 in | Wt 188.0 lb

## 2022-06-16 DIAGNOSIS — Z79899 Other long term (current) drug therapy: Secondary | ICD-10-CM | POA: Diagnosis not present

## 2022-06-16 DIAGNOSIS — M810 Age-related osteoporosis without current pathological fracture: Secondary | ICD-10-CM | POA: Insufficient documentation

## 2022-06-16 DIAGNOSIS — M0600 Rheumatoid arthritis without rheumatoid factor, unspecified site: Secondary | ICD-10-CM

## 2022-06-16 DIAGNOSIS — Z1231 Encounter for screening mammogram for malignant neoplasm of breast: Secondary | ICD-10-CM | POA: Diagnosis not present

## 2022-06-16 LAB — URINE CULTURE
MICRO NUMBER:: 14626684
SPECIMEN QUALITY:: ADEQUATE

## 2022-06-16 LAB — HM MAMMOGRAPHY

## 2022-06-19 ENCOUNTER — Encounter: Payer: Self-pay | Admitting: Physician Assistant

## 2022-06-21 ENCOUNTER — Other Ambulatory Visit: Payer: Self-pay | Admitting: Physician Assistant

## 2022-06-21 DIAGNOSIS — G8929 Other chronic pain: Secondary | ICD-10-CM

## 2022-06-21 MED ORDER — CEFDINIR 300 MG PO CAPS: 300.0000 mg | ORAL_CAPSULE | Freq: Two times a day (BID) | ORAL | 0 refills | Status: DC

## 2022-06-21 MED ORDER — CLINDAMYCIN HCL 300 MG PO CAPS: 300.0000 mg | ORAL_CAPSULE | Freq: Three times a day (TID) | ORAL | 0 refills | Status: AC

## 2022-06-29 ENCOUNTER — Other Ambulatory Visit: Payer: Self-pay | Admitting: Physician Assistant

## 2022-07-05 ENCOUNTER — Telehealth: Payer: Self-pay | Admitting: Physician Assistant

## 2022-07-05 NOTE — Telephone Encounter (Signed)
Contacted Vanessa Harmon to schedule their annual wellness visit. Patient declined to schedule AWV at this time.  [place] Direct Dial [phone]

## 2022-07-07 ENCOUNTER — Other Ambulatory Visit: Payer: Self-pay

## 2022-07-07 ENCOUNTER — Ambulatory Visit (INDEPENDENT_AMBULATORY_CARE_PROVIDER_SITE_OTHER): Payer: Medicare Other

## 2022-07-07 DIAGNOSIS — E538 Deficiency of other specified B group vitamins: Secondary | ICD-10-CM | POA: Diagnosis not present

## 2022-07-07 MED ORDER — CYANOCOBALAMIN 1000 MCG/ML IJ SOLN: 1000.0000 ug | INTRAMUSCULAR | 0 refills | Status: DC | PRN

## 2022-07-07 MED ORDER — CYANOCOBALAMIN 1000 MCG/ML IJ SOLN
1000.0000 ug | Freq: Once | INTRAMUSCULAR | Status: AC
  Administered 2022-07-07: 1000 ug via INTRAMUSCULAR

## 2022-07-07 NOTE — Progress Notes (Signed)
Pt received 1st  b12 injection in right thigh, pt tolerated well. Pt was given instructions on what to do at home, and b12 was sent to pharmacy

## 2022-07-11 DIAGNOSIS — M7062 Trochanteric bursitis, left hip: Secondary | ICD-10-CM | POA: Diagnosis not present

## 2022-07-12 ENCOUNTER — Other Ambulatory Visit: Payer: Self-pay | Admitting: Physician Assistant

## 2022-07-13 ENCOUNTER — Encounter: Payer: Self-pay | Admitting: Internal Medicine

## 2022-07-13 ENCOUNTER — Ambulatory Visit: Payer: Medicare Other | Attending: Internal Medicine | Admitting: Internal Medicine

## 2022-07-13 VITALS — BP 155/85 | HR 61 | Resp 14 | Ht 66.0 in | Wt 186.0 lb

## 2022-07-13 DIAGNOSIS — M0600 Rheumatoid arthritis without rheumatoid factor, unspecified site: Secondary | ICD-10-CM

## 2022-07-13 DIAGNOSIS — M7702 Medial epicondylitis, left elbow: Secondary | ICD-10-CM | POA: Diagnosis not present

## 2022-07-13 MED ORDER — TRIAMCINOLONE ACETONIDE 40 MG/ML IJ SUSP
40.0000 mg | INTRAMUSCULAR | Status: AC | PRN
  Administered 2022-07-13: 40 mg via INTRA_ARTICULAR

## 2022-07-13 NOTE — Progress Notes (Addendum)
Office Visit Note  Patient: Vanessa Harmon             Date of Birth: 1950-04-30           MRN: [id number]             PCP: Inda Coke, PA Referring: Inda Coke, PA Visit Date: 07/13/2022   Subjective:  Follow-up (Patient states left elbow is hurting for three weeks now.)   History of Present Illness: Vanessa Harmon is a 72 y.o. female here for follow up for seronegative RA on rinvoq 15 mg PO daily and prednisone 5 mg daily.  Since her last visit she finished getting over the persistent sinus infection symptoms after recent COVID illness.  However once the symptoms cleared up she started noticing increase in hip bursitis pain.  Also started getting worsening pain along the medial side of the left elbow.  Most noticeable when using her hands to help her self push off from a seated position due to her hip pain.  Not noticed any visible swelling or erythema she still has bruising around the elbow but that is more chronic and there are multiple skin tears in the area as well.  Does not feel like she is having flareup of increased joint symptoms outside of the hip and elbow pains.  She previously tried use of the elbow brace which we discussed before for her medial epicondylitis but just gets irritation around the skin and elbow does not feel much benefit.  Previous HPI 06/16/22 Vanessa Harmon is a 72 y.o. female here for follow up for seronegative RA on prednisone 5 mg daily and rinvoq 15 mg daily started since 2/18. Also prolia for osteoporosis last injection on 0000000 without complication. Main issue has been sickness with COVID positive test 2/8 and persistent symptoms. She had subsequent sinus infection treated with augmentin starting 2/21 and had repeat labs checked yesterday. Brain fog, fatigue worse during COVID infection and partially improved since. She thinks rinvoq is starting to help with swelling and pain in hands and wrists but still significant overall symptoms. Has some  skin tears and bruising on arms from her dog.    Previous HPI 03/16/22 Vanessa Harmon is a 72 y.o. female here for follow up for seropositive RA on prednisone 5 mg daily. Methotrexate was discontinued due to worsening laboratory function tests and Actemra stopped due to developing palpable rashes throughout her legs similar to previous medication reaction to infliximab. She is also noticed easier bruising than usual and has seen discoloration of her nails with some separation and cracking.  Prednisone is helping somewhat but still has ongoing joint pain and stiffness specially off of her previous medications.  In particular bothering her lately has been persistent pain along the medial side of her left elbow does not recall any specific injury to this area but does feel like there is a sense of swelling and pressure.   Previous HPI 12/14/21 Vanessa Harmon is a 72 y.o. female here for follow up for seropositive RA on methotrexate 15 mg subcu weekly prednisone 5 mg daily leucovorin 1 mg daily and after starting Actemra 162 mg [place] q. 14 days.  She feels like arthritis symptoms have been well controlled on her current medications.  No significant flareups.  She still gets swelling in her wrist but no other visible joint swelling.  She has had pretty extensive bruising and easy skin tearing on both arms often from playing with her dog.  Lab tests checked in July did show appears to be a small decrease in estimated GFR.   Previous HPI 09/10/2021 Vanessa Harmon is a 72 y.o. female here for evaluation of chronic back pain associated with seropositive RA she has been seeing [name] on treatment with orencia IV, MTX 15 mg [place] weekly, leucovorin 1 mg daily, and prednisone 5 mg daily.  She was originally diagnosed with inflammatory arthritis by Dr. [name] about a decade ago.  Apparently the worst initial symptom at that time was left hip pain and inflammation with concerning findings on MRI.  Was started  on methotrexate as a steroid sparing DMARD treatment.  Apparently had initial concern for seronegative rheumatoid arthritis versus PMR presentation.  She did not tolerate methotrexate well initially worst side effect was substantial hair loss.  Also did not tolerate well on leflunomide.  She was started on infliximab treatment developed a significant adverse reaction with new skin rashes in multiple areas and severe finger and toe nail changes that lasted about a year after discontinuing the medication.  She was started on IV Orencia monthly infusions which has been beneficial for symptoms but still notices some amount ongoing and that symptoms tend to worsen between each dosing interval.  She was restarted on methotrexate this time with leucovorin in the past few months and this has improved her joint inflammation further.  She remains on low-dose prednisone 5 mg daily throughout most of this time.  Most recent definite flareup of her rheumatoid arthritis was about 6 months ago. In addition to inflammatory joint disease she has significant amount of generalized osteoarthritis.  She reports bone-on-bone degenerative changes with slightly impaired range of motion in the left shoulder.  She had previous left knee total arthroplasty.  She has degenerative arthritis at multiple levels in the spine with history of repeat lumbar spine surgery.  She has some spinal and foraminal stenosis with radicular symptoms.  She is also had recurrent problems with hip bursitis has been responsive to local steroid injection treatments with Dr. [name].  She takes gabapentin 300 mg at night and as needed Robaxin 500 mg for the ongoing symptoms. She has not had any history with recurrent infections or major side effects on her current regimen.  She does have extensive bruising mostly worst in the past year and worst on extensor surfaces of arms and legs.  She denies any history of abnormal bleeding and blood clots. She has chronic dry  eyes and sees Dr. [name] currently uses xiidra and genteal drops for this.     Review of Systems  Constitutional:  Positive for fatigue.  HENT:  Positive for mouth sores and mouth dryness.   Eyes:  Positive for dryness.  Respiratory:  Negative for shortness of breath.   Cardiovascular:  Negative for chest pain and palpitations.  Gastrointestinal:  Positive for constipation. Negative for blood in stool and diarrhea.  Endocrine: Negative for increased urination.  Genitourinary:  Negative for involuntary urination.  Musculoskeletal:  Positive for joint pain, gait problem, joint pain, morning stiffness and muscle tenderness. Negative for joint swelling, myalgias, muscle weakness and myalgias.  Skin:  Positive for color change. Negative for rash, hair loss and sensitivity to sunlight.  Allergic/Immunologic: Negative for susceptible to infections.  Neurological:  Negative for dizziness and headaches.  Hematological:  Negative for swollen glands.  Psychiatric/Behavioral:  Positive for depressed mood and sleep disturbance. The patient is nervous/anxious.     PMFS History:  Patient Active Problem List  Diagnosis Date Noted   Age-related osteoporosis without current pathological fracture 06/16/2022   Medial epicondylitis of left elbow 03/16/2022   High risk medication use 09/10/2021   Posterior vitreous detachment of left eye 09/29/2020   Thoracic spondylosis with cord compression 05/29/2020   Heterotopic ossification 03/30/2020   Loosening of hardware in spine ([place]) 01/27/2020   Posterior vitreous detachment of right eye 01/21/2020   Pseudophakia 01/21/2020   Horseshoe retinal tear of right eye 01/21/2020   Body mass index (BMI) 28.0-28.9, adult 11/27/2019   Left knee OA 11/26/2019   Numbness of foot 02/07/2019   Seronegative rheumatoid arthritis ([place]) 01/25/2019   Peripheral polyneuropathy 01/14/2019   Spinal stenosis, thoracolumbar region 01/14/2019   Thoracogenic scoliosis,  thoracolumbar region 01/14/2019   Idiopathic scoliosis of thoracolumbar region 02/15/2018   Rhinitis, chronic 05/16/2017   Moderate persistent asthma without complication [id number]   History of polymyalgia rheumatica 02/15/2015   DJD (degenerative joint disease) 04/15/2014   Lumbar spine scoliosis 11/28/2013   Degenerative spondylolisthesis 10/09/2013   Lumbar radiculopathy 09/16/2013   Lumbosacral radiculitis 07/31/2013   Scoliosis, or kyphoscoliosis, idiopathic 07/31/2013   Lumbar back pain 12/19/2011   Hyperlipidemia 10/16/2010   Depression 10/16/2010   Lumbar spondylosis 10/16/2010   Gastroesophageal reflux disease 10/16/2010   Adenomatous polyp of colon 10/16/2010   History of paroxysmal supraventricular tachycardia 10/16/2010   Essential (primary) hypertension 10/16/2010    Past Medical History:  Diagnosis Date   Adenomatous polyp    Allergy    Anxiety    takes Ativan daily as needed   Arthritis    inflammatory arthritis (Dr. Dagoberto Ligas)   Asthma    Cataract    Chronic back pain    scoliosis    Constipation    takes miralax every other day   COVID 05/19/2022   Depression    takes Lexapro daily   Eczema    Essential hypertension, benign    takes Hyzaar and Metoprolol daily   Family history of adverse reaction to anesthesia    Sister PONV   Fibromyalgia    GERD (gastroesophageal reflux disease)    History of colon polyps    History of shingles    Hyperlipidemia    takes Atorvastatin daily   Insomnia    takes Melatonin nightly   Joint pain    Osteoarthritis    Osteoporosis    PSVT (paroxysmal supraventricular tachycardia)    Recurrent upper respiratory infection (URI)    Rheumatoid arthritis ([place])    sees Dr. Amil Amen; stable on Orencia   Rhinitis, allergic    uses Flonase daily   Urinary urgency    Urticaria     Family History  Problem Relation Age of Onset   Atrial fibrillation Mother    Stroke Mother    Allergic rhinitis Mother     Allergic rhinitis Father    AAA (abdominal aortic aneurysm) Father    COPD Father    Prostate cancer Father    Atrial fibrillation Brother    Hypertension Other        unspecified grandmother   AAA (abdominal aortic aneurysm) Other        unspecified grandfather   Cancer Other        unspecified grandfather   Neuropathy Neg Hx    Past Surgical History:  Procedure Laterality Date   ADENOIDECTOMY     APPENDECTOMY     BACK SURGERY  2012   at age 22 d/t scoliosis   BLEPHAROPLASTY  2018  BREAST BIOPSY     cataract surgery     COLONOSCOPY     HARDWARE REMOVAL Left 02/24/2020   Procedure: HARDWARE REMOVAL LEFT DISTAL RADIUS;  Surgeon: Leanora Cover, MD;  Location: [place];  Service: Orthopedics;  Laterality: Left;   KNEE ARTHROSCOPY Bilateral    neuroma removed from foot     unsure of which foot   OPEN REDUCTION INTERNAL FIXATION (ORIF) DISTAL RADIAL FRACTURE Left 08/15/2019   Procedure: OPEN REDUCTION INTERNAL FIXATION (ORIF) DISTAL RADIAL FRACTURE;  Surgeon: Leanora Cover, MD;  Location: [place];  Service: Orthopedics;  Laterality: Left;  block in preop   POSTERIOR CERVICAL FUSION/FORAMINOTOMY N/A 05/29/2020   Procedure: Removal of hardware, Replacement of thoracic 8, thoracic 9, thoracic 10 screws, Right Thoracic 6-7, Right Thoracic 7-8 Laminectomies with thoracic hooks from Thoracic 4-5 to Thoracic 10;  Surgeon: Erline Levine, MD;  Location: [place];  Service: Neurosurgery;  Laterality: N/A;   POSTERIOR LUMBAR FUSION 4 LEVEL N/A 02/15/2018   Procedure: Decompression and fusion Thoracic nine to Lumbar two with exploration of previous fusion;  Surgeon: Erline Levine, MD;  Location: [place];  Service: Neurosurgery;  Laterality: N/A;   SHOULDER SURGERY Right    TONSILLECTOMY     TOTAL HIP ARTHROPLASTY Right 04/15/2014   Procedure: RIGHT TOTAL HIP ARTHROPLASTY ANTERIOR APPROACH;  Surgeon: Renette Butters, MD;  Location: [facility];  Service: Orthopedics;   Laterality: Right;   TOTAL KNEE ARTHROPLASTY Left 11/26/2019   Procedure: TOTAL KNEE ARTHROPLASTY;  Surgeon: Paralee Cancel, MD;  Location: [facility];  Service: Orthopedics;  Laterality: Left;  70 mins   Social History   Social History Narrative   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   Lives with husband   Immunization History  Administered Date(s) Administered   Influenza Split 01/31/2012   Influenza Whole 02/16/2010   Influenza, High Dose Seasonal PF 12/13/2018, 02/18/2022   Influenza,inj,Quad PF,6+ Mos 01/14/2014, 01/27/2015, 02/02/2016   Influenza,inj,quad, With Preservative 02/26/2020   Influenza-Unspecified 02/24/2021   Moderna Sars-Covid-2 Vaccination 02/20/2022   PFIZER(Purple Top)SARS-COV-2 Vaccination 05/25/2019, 06/15/2019, 02/05/2020, 08/03/2020   Pneumococcal Conjugate-13 01/27/2015   Pneumococcal Polysaccharide-23 05/26/2017   Tdap 10/04/2011   Zoster Recombinat (Shingrix) 10/13/2017, 01/02/2018     Objective: Vital Signs: BP (!) 155/85 (BP Location: Right Arm, Patient Position: Sitting, Cuff Size: Normal)   Pulse 61   Resp 14   Ht 5\' 6"  (1.676 m)   Wt 186 lb (84.4 kg)   BMI 30.02 kg/m    Physical Exam Constitutional:      Appearance: She is obese.  Eyes:     Conjunctiva/sclera: Conjunctivae normal.  Cardiovascular:     Rate and Rhythm: Normal rate and regular rhythm.  Pulmonary:     Effort: Pulmonary effort is normal.     Breath sounds: Normal breath sounds.  Skin:    Findings: Bruising present.  Neurological:     Mental Status: She is alert.  Psychiatric:        Mood and Affect: Mood normal.      Musculoskeletal Exam:  Left elbow tenderness to pressure just distal to the medial epicondyle extending a few inches toward the forearm, elbow pain is increased with resisted pronation of the left hand, no palpable swelling, right elbow normal Wrists full ROM no tenderness or swelling Fingers full ROM no tenderness or swelling Bilateral left worse than right  lateral hip tenderness to pressure and pain with internal rotation  Investigation: No additional findings.  Imaging: No  results found.  Recent Labs: Lab Results  Component Value Date   WBC 11.2 (H) 06/15/2022   HGB 12.2 06/15/2022   PLT 397.0 06/15/2022   NA 138 06/15/2022   K 4.5 06/15/2022   CL 99 06/15/2022   CO2 30 06/15/2022   GLUCOSE 131 (H) 06/15/2022   BUN 25 (H) 06/15/2022   CREATININE 1.18 06/15/2022   BILITOT 0.5 06/15/2022   ALKPHOS 40 06/15/2022   AST 21 06/15/2022   ALT 18 06/15/2022   PROT 6.5 06/15/2022   ALBUMIN 4.1 06/15/2022   CALCIUM 9.9 06/15/2022   GFRAA >60 11/27/2019   QFTBGOLDPLUS NEGATIVE 09/10/2021    Speciality Comments: No specialty comments available.  Procedures:  Medium Joint Inj: L elbow (L Medial epicondyle) on 07/13/2022 4:20 PM Indications: pain Details: 27 G 1.5 in needle Medications: 40 mg triamcinolone acetonide 40 MG/ML Outcome: tolerated well, no immediate complications Procedure, treatment alternatives, risks and benefits explained, specific risks discussed. Consent was given by the patient. Immediately prior to procedure a time out was called to verify the correct patient, procedure, equipment, support staff and site/side marked as required. Patient was prepped and draped in the usual sterile fashion.     Allergies: Advair hfa [fluticasone-salmeterol], Statins, Breo ellipta [fluticasone furoate-vilanterol], Fosamax [alendronate sodium], Levaquin [levofloxacin], Doxycycline hyclate, Infliximab, Spiriva handihaler [tiotropium bromide monohydrate], Verapamil, and Tape   Assessment / Plan:     Visit Diagnoses: Medial epicondylitis of left elbow  Symptoms consistent with medial epicondylitis of the left elbow she has had similar symptoms about 6 months ago with good response to local steroid injection.  Not improving with brace been getting worse for the past 3 weeks.  If she starts having more frequent repeat symptoms probably  need to consider a more prolonged course with physical therapy or other evaluation. Repeat steroid injection today.    Seronegative rheumatoid arthritis (HCC)  Does not appear to be part of a more widespread rheumatoid arthritis flare although this will be possible especially with recent infection COVID illness then prolonged sinus infection symptoms.  Continuing the current Rinvoq 15 mg of prednisone 5 mg daily without modification.  Discussed if she does see a widespread symptoms increase may need a steroid taper.  Orders: Orders Placed This Encounter  Procedures   Medium Joint Inj   No orders of the defined types were placed in this encounter.    Follow-Up Instructions: No follow-ups on file.   Fuller Plan, MD  Note - This record has been created using [employer].  Chart creation errors have been sought, but may not always  have been located. Such creation errors do not reflect on  the standard of medical care.

## 2022-07-18 ENCOUNTER — Encounter: Payer: Self-pay | Admitting: Physician Assistant

## 2022-07-19 DIAGNOSIS — J4531 Mild persistent asthma with (acute) exacerbation: Secondary | ICD-10-CM | POA: Diagnosis not present

## 2022-07-19 MED ORDER — "BD LUER-LOK SYRINGE 25G X 1"" 3 ML MISC": 0 refills | Status: DC

## 2022-07-25 ENCOUNTER — Encounter: Payer: Self-pay | Admitting: Internal Medicine

## 2022-07-25 DIAGNOSIS — M0600 Rheumatoid arthritis without rheumatoid factor, unspecified site: Secondary | ICD-10-CM

## 2022-07-25 NOTE — Telephone Encounter (Signed)
Patient contacted the office requesting a refill of Rinvoq be sent to [facility].  Last Fill: Not listed  Labs: 06/15/2022 Glucose 131, BUN 25, GFR 45.95, WBC 11.2, RBC 3.67, Neutrophils 80.6, Lymphocytes 10.4, Neutro Abs 9.0  TB Gold: 09/10/2021 Negative   Next Visit: 09/21/2022  Last Visit: 07/13/2022  DX: Medial epicondylitis of left elbow   Current Dose per office note 07/13/2022: Rinvoq 15 mg   Okay to refill Rinvoq?

## 2022-07-26 MED ORDER — RINVOQ 15 MG PO TB24: 15.0000 mg | ORAL_TABLET | Freq: Every day | ORAL | 2 refills | Status: DC

## 2022-07-26 NOTE — Telephone Encounter (Signed)
It looks like this patient was going through [facility] assist for the rinvoq, do we need to resubmit anything for her? I do not see that any Rx have been sent through the EHR previously.

## 2022-07-29 DIAGNOSIS — J310 Chronic rhinitis: Secondary | ICD-10-CM | POA: Diagnosis not present

## 2022-07-29 DIAGNOSIS — J018 Other acute sinusitis: Secondary | ICD-10-CM | POA: Diagnosis not present

## 2022-07-29 DIAGNOSIS — J4530 Mild persistent asthma, uncomplicated: Secondary | ICD-10-CM | POA: Diagnosis not present

## 2022-08-18 ENCOUNTER — Other Ambulatory Visit: Payer: Self-pay | Admitting: Internal Medicine

## 2022-08-18 ENCOUNTER — Other Ambulatory Visit: Payer: Self-pay | Admitting: *Deleted

## 2022-08-18 DIAGNOSIS — E7849 Other hyperlipidemia: Secondary | ICD-10-CM

## 2022-08-22 ENCOUNTER — Encounter: Payer: Self-pay | Admitting: Student in an Organized Health Care Education/Training Program

## 2022-08-22 ENCOUNTER — Ambulatory Visit
Payer: Medicare Other | Attending: Student in an Organized Health Care Education/Training Program | Admitting: Student in an Organized Health Care Education/Training Program

## 2022-08-22 VITALS — BP 139/73 | HR 76 | Temp 97.3°F | Resp 16 | Ht 66.5 in | Wt 186.0 lb

## 2022-08-22 DIAGNOSIS — M47816 Spondylosis without myelopathy or radiculopathy, lumbar region: Secondary | ICD-10-CM | POA: Insufficient documentation

## 2022-08-22 DIAGNOSIS — M48062 Spinal stenosis, lumbar region with neurogenic claudication: Secondary | ICD-10-CM | POA: Diagnosis not present

## 2022-08-22 DIAGNOSIS — M5416 Radiculopathy, lumbar region: Secondary | ICD-10-CM | POA: Diagnosis not present

## 2022-08-22 DIAGNOSIS — G894 Chronic pain syndrome: Secondary | ICD-10-CM

## 2022-08-22 DIAGNOSIS — G8929 Other chronic pain: Secondary | ICD-10-CM | POA: Insufficient documentation

## 2022-08-22 DIAGNOSIS — M5136 Other intervertebral disc degeneration, lumbar region: Secondary | ICD-10-CM

## 2022-08-22 DIAGNOSIS — Z981 Arthrodesis status: Secondary | ICD-10-CM | POA: Diagnosis not present

## 2022-08-22 DIAGNOSIS — M4726 Other spondylosis with radiculopathy, lumbar region: Secondary | ICD-10-CM | POA: Diagnosis not present

## 2022-08-22 NOTE — Patient Instructions (Addendum)
Pain Management Discharge Instructions  General Discharge Instructions :  If you need to reach your doctor call: Monday-Friday 8:00 am - 4:00 pm at [phone] or toll free [phone].  After clinic hours [phone] to have operator reach doctor.  Bring all of your medication bottles to all your appointments in the pain clinic.  To cancel or reschedule your appointment with Pain Management please remember to call 24 hours in advance to avoid a fee.  Refer to the educational materials which you have been given on: General Risks, I had my Procedure. Discharge Instructions, Post Sedation.  Post Procedure Instructions:  The drugs you were given will stay in your system until tomorrow, so for the next 24 hours you should not drive, make any legal decisions or drink any alcoholic beverages.  You may eat anything you prefer, but it is better to start with liquids then soups and crackers, and gradually work up to solid foods.  Please notify your doctor immediately if you have any unusual bleeding, trouble breathing or pain that is not related to your normal pain.  Depending on the type of procedure that was done, some parts of your body may feel week and/or numb.  This usually clears up by tonight or the next day.  Walk with the use of an assistive device or accompanied by an adult for the 24 hours.  You may use ice on the affected area for the first 24 hours.  Put ice in a Ziploc bag and cover with a towel and place against area 15 minutes on 15 minutes off.  You may switch to heat after 24 hours.We have discussed a caudal epidural steroid injection as well as a sacroiliac joint injection Consider acupuncture

## 2022-08-22 NOTE — Progress Notes (Signed)
Safety precautions to be maintained throughout the outpatient stay will include: orient to surroundings, keep bed in low position, maintain call bell within reach at all times, provide assistance with transfer out of bed and ambulation.  

## 2022-08-22 NOTE — Progress Notes (Signed)
Patient: Vanessa Harmon  Service Category: E/M  Provider: Edward Jolly, MD  DOB: February 16, 1951  DOS: 08/22/2022  Referring Provider: Jarold Motto, PA  MRN: [id number]  Setting: Ambulatory outpatient  PCP: Jarold Motto, PA  Type: New Patient  Specialty: Interventional Pain Management    Location: Office  Delivery: Face-to-face     Primary Reason(s) for Visit: Encounter for initial evaluation of one or more chronic problems (new to examiner) potentially causing chronic pain, and posing a threat to normal musculoskeletal function. (Level of risk: High) CC: Back Pain and Pain (Scapular  right and left )  HPI  Vanessa Harmon is a 72 y.o. year old, female patient, who comes for the first time to our practice referred by Jarold Motto, PA for our initial evaluation of her chronic pain. She has Hyperlipidemia; Depression; Lumbar spondylosis; Gastroesophageal reflux disease; Adenomatous polyp of colon; History of paroxysmal supraventricular tachycardia; Essential (primary) hypertension; Lumbar back pain; Lumbar spine scoliosis; DJD (degenerative joint disease); History of polymyalgia rheumatica; Idiopathic scoliosis of thoracolumbar region; Left knee OA; Body mass index (BMI) 28.0-28.9, adult; Posterior vitreous detachment of right eye; Pseudophakia; Horseshoe retinal tear of right eye; Thoracic spondylosis with cord compression; Degenerative spondylolisthesis; Heterotopic ossification; Loosening of hardware in spine (HCC); Lumbosacral radiculitis; Moderate persistent asthma without complication; Peripheral polyneuropathy; Seronegative rheumatoid arthritis (HCC); Spinal stenosis, thoracolumbar region; Thoracogenic scoliosis, thoracolumbar region; Lumbar radiculopathy; Scoliosis, or kyphoscoliosis, idiopathic; Rhinitis, chronic; Numbness of foot; Posterior vitreous detachment of left eye; High risk medication use; Medial epicondylitis of left elbow; and Age-related osteoporosis without current pathological  fracture on their problem list. Today she comes in for evaluation of her Back Pain and Pain (Scapular  right and left )  Pain Assessment: Location: Right, Mid, Lower (5 back surgeries) Back Radiating: buttock down side of right leg to calve Onset: More than a month ago Duration: Chronic pain Quality: Aching, Constant, Sharp, Burning ("Nerve pain") Severity: 3 /10 (subjective, self-reported pain score)  Effect on ADL: prolonged walking and standing Timing: Constant Modifying factors: meds BP: 139/73  HR: 76  Onset and Duration: Present longer than 3 months Cause of pain: Surgery Severity: Getting worse, NAS-11 at its worse: 3/10, NAS-11 at its best: 3/10, NAS-11 now: 4/10, and NAS-11 on the average: 4/10 Timing: Afternoon, Night, During activity or exercise, and After activity or exercise Aggravating Factors: Bending, Climbing, Lifiting, Prolonged sitting, Prolonged standing, Surgery made it worse, and Walking Alleviating Factors: Stretching, Lying down, Medications, and Resting Associated Problems: Depression, Fatigue, and Weakness Quality of Pain: Burning, Exhausting, Sharp, and Tiring Previous Examinations or Tests: CT scan, Ct-Myelogram, MRI scan, and X-rays Previous Treatments: Epidural steroid injections, Narcotic medications, Physical Therapy, Steroid treatments by mouth, Strengthening exercises, and Stretching exercises  Vanessa Harmon is being evaluated for possible interventional pain management therapies for the treatment of her chronic pain.   Patient is a pleasant 72 year old female who presents with a chief complaint of low back and bilateral leg pain that is chronic in nature.  She has a history of 5 prior lumbar spine surgeries with her first 1 being in the 1960s and her most recent one being in 2022.  She has a T4-L5 lumbar spinal fusion.  She also has a history of seropositive rheumatoid arthritis.  She has difficulty managing her pain and has limited range of motion and  is pretty restricted in her ability to ambulate.  She does not ambulate with any assist device.  She is currently seen and managed at [facility] with Dr. Horald Chestnut.  She is here for a second opinion on any potential therapeutic options or suggestions that I may have.  She has tried physical therapy, spinal injections, various medication trials including NSAIDs, acetaminophen, steroids, opioid trials, neuromodulators including gabapentin and Lyrica with limited response.  She is currently on hydrocodone 10 mg every 6 hours as needed along with gabapentin 300 mg 3 times a day.  Vanessa Harmon has been informed that this initial visit was an evaluation only.  On the follow up appointment I will go over the results, including ordered tests and available interventional therapies. At that time she will have the opportunity to decide whether to proceed with offered therapies or not. In the event that Vanessa Harmon pre Meds   Current Outpatient Medications:    albuterol (PROAIR HFA) 108 (90 Base) MCG/ACT inhaler, Inhale two puffs every 4-6 hours if needed for cough or wheeze., Disp: 8.5 Inhaler, Rfl: 1   amLODipine (NORVASC) 10 MG tablet, Take 1 tablet (10 mg total) by mouth daily., Disp: 90 tablet, Rfl: 1   aspirin EC 81 MG tablet, Take 1 tablet (81 mg total) by mouth daily. Swallow whole., Disp: 30 tablet, Rfl: 11   azelastine (ASTELIN) 0.1 % nasal spray, Place 1 spray into both nostrils 2 (two) times daily. Use in each nostril as directed, Disp: , Rfl:    Bempedoic Acid (NEXLETOL) 180 MG TABS, Take 1 Dose by mouth daily., Disp: 90 tablet, Rfl: 3   betamethasone dipropionate 0.05 % cream, SMARTSIG:1 sparingly Topical Twice Daily, Disp: , Rfl:    budesonide (PULMICORT) 0.5 MG/2ML nebulizer solution, Take by nebulization., Disp: , Rfl:    Calcium Carb-Cholecalciferol (CALCIUM 600/VITAMIN D3 PO), Take 1 tablet by mouth daily., Disp: , Rfl:    carboxymethylcellulose (REFRESH PLUS) 0.5 % SOLN, Place 1 drop into both  eyes 3 (three) times daily as needed (dry eyes). , Disp: , Rfl:    Cetirizine HCl (ZYRTEC ALLERGY) 10 MG CAPS, Zyrtec 10 mg capsule, Disp: , Rfl:    Cholecalciferol 125 MCG (5000 UT) TABS, Vitamin D3 125 mcg (5,000 unit) tablet  Take 1 tablet every day by oral route., Disp: , Rfl:    cyanocobalamin (VITAMIN B12) 1000 MCG/ML injection, Inject 1 mL (1,000 mcg total) into the muscle as needed. Inject 1 mL (1,000 mcg total) into thigh muscle once weekly for 3 weeks then once monthly for 3 months, Disp: 6 mL, Rfl: 0   denosumab (PROLIA) 60 MG/ML SOSY injection, Inject 60 mg into the skin every 6 (six) months. 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 Appt on 05/11/22, Disp: 1 mL, Rfl: 0   diphenhydrAMINE (BENADRYL) 25 MG tablet, Take 50 mg by mouth daily as needed for allergies., Disp: , Rfl:    docusate sodium (COLACE) 100 MG capsule, , Disp: , Rfl:    escitalopram (LEXAPRO) 20 MG tablet, Take 1 tablet (20 mg total) by mouth daily., Disp: 90 tablet, Rfl: 3   Evolocumab (REPATHA SURECLICK) 140 MG/ML SOAJ, INJECT 1 DOSE INTO THE SKIN EVERY 14 (FOURTEEN) DAYS, Disp: 6 mL, Rfl: 3   ezetimibe (ZETIA) 10 MG tablet, TAKE ONE TABLET BY MOUTH DAILY, Disp: 90 tablet, Rfl: 1   famotidine (PEPCID) 40 MG tablet, TAKE 1 TABLET (40 MG TOTAL) BY MOUTH AT BEDTIME., Disp: 30 tablet, Rfl: 5   furosemide (LASIX) 20 MG tablet, Take 1 tablet (20 mg total) by mouth daily., Disp: 90 tablet, Rfl: 1   gabapentin (NEURONTIN) 300 MG capsule, Take 1 capsule (  300 mg total) by mouth at bedtime., Disp: 90 capsule, Rfl: 3   guaiFENesin (MUCINEX) 600 MG 12 hr tablet, Take 600 mg by mouth 2 (two) times daily as needed for cough or to loosen phlegm., Disp: , Rfl:    HYDROcodone-acetaminophen (NORCO) 10-325 MG tablet, 1 tablet every 6 (six) hours as needed., Disp: , Rfl:    indomethacin (INDOCIN SR) 75 MG CR capsule, TAKE ONE CAPSULE BY MOUTH DAILY WITH BREAKFAST, Disp: 90 capsule, Rfl: 0   ipratropium (ATROVENT)  0.03 % nasal spray, Place 1 spray into both nostrils 2 (two) times daily as needed for rhinitis., Disp: , Rfl:    irbesartan (AVAPRO) 300 MG tablet, TAKE ONE TABLET BY MOUTH DAILY, Disp: 90 tablet, Rfl: 0   Lifitegrast (XIIDRA) 5 % SOLN, Place 1 drop into both eyes in the morning and at bedtime. , Disp: , Rfl:    methocarbamol (ROBAXIN) 500 MG tablet, Take 1 tablet (500 mg total) by mouth every 6 (six) hours as needed for muscle spasms., Disp: 60 tablet, Rfl: 2   montelukast (SINGULAIR) 10 MG tablet, Take 1 tablet (10 mg total) by mouth at bedtime., Disp: 30 tablet, Rfl: 5   Multiple Vitamin (MULTIVITAMIN) capsule, Take 1 capsule by mouth daily., Disp: , Rfl:    omeprazole (PRILOSEC) 40 MG capsule, TAKE ONE CAPSULE BY MOUTH DAILY BEFORE BREAKFAST DAILY (Patient taking differently: Take 40 mg by mouth daily. TAKE ONE CAPSULE BY MOUTH DAILY BEFORE BREAKFAST DAILY), Disp: 30 capsule, Rfl: 4   OVER THE COUNTER MEDICATION, Place 1 application into both eyes at bedtime. Genteal overnight eye ointment, Disp: , Rfl:    polyethylene glycol powder (GLYCOLAX/MIRALAX) 17 GM/SCOOP powder, Miralax 17 gram/dose oral powder, Disp: , Rfl:    predniSONE (DELTASONE) 5 MG tablet, Take 1 tablet (5 mg total) by mouth daily with breakfast., Disp: 30 tablet, Rfl: 1   PRESCRIPTION MEDICATION, Place 1 spray into the nose in the morning and at bedtime. Budesonide 0.5 mg / 2 mL Nasal rinse, Disp: , Rfl:    pseudoephedrine (SUDAFED) 30 MG tablet, Take 30 mg by mouth every 4 (four) hours as needed for congestion., Disp: , Rfl:    SYMBICORT 160-4.5 MCG/ACT inhaler, Inhale 2 puffs into the lungs 2 (two) times daily., Disp: , Rfl:    SYRINGE-NEEDLE, DISP, 3 ML (B-D 3CC LUER-LOK SYR 25GX1") 25G X 1" 3 ML MISC, USE TO INJECT VITAMIN B12, Disp: 10 each, Rfl: 0   traZODone (DESYREL) 150 MG tablet, TAKE ONE TABLET BY MOUTH DAILY, Disp: 90 tablet, Rfl: 0   Upadacitinib ER (RINVOQ) 15 MG TB24, Take 1 tablet (15 mg total) by mouth daily.,  Disp: 30 tablet, Rfl: 2   valACYclovir (VALTREX) 1000 MG tablet, Take 1,000 mg by mouth daily. , Disp: , Rfl:    vitamin C (ASCORBIC ACID) 500 MG tablet, Take 500 mg by mouth daily., Disp: , Rfl:   Imaging Review   Narrative CLINICAL DATA:  Right lateral hip and anterior leg pain since August. Prior T9-S1 fusion. Last fusion surgery was in October 2019.  EXAM: CERVICAL, THORACIC, AND LUMBAR MYELOGRAM  CT CERVICAL MYELOGRAM  CT THORACIC MYELOGRAM  CT LUMBAR MYELOGRAM  FLUOROSCOPY TIME:  Radiation Exposure Index (as provided by the fluoroscopic device): 18.6 mGy  Fluoroscopy Time:  45 seconds  Number of Acquired Images:  29  PROCEDURE: LUMBAR PUNCTURE FOR CERVICAL, THORACIC, AND LUMBAR MYELOGRAM  After thorough discussion of risks and benefits of the procedure including bleeding, infection, injury to nerves,  blood vessels, adjacent structures as well as headache and CSF leak, written and oral informed consent was obtained. Consent was obtained by Dr. Malachi Pro.  Patient was positioned prone on the fluoroscopy table. Local anesthesia was provided with 1% lidocaine without epinephrine after prepped and draped in the usual sterile fashion. Puncture was performed at L4-L5 using a 3 1/2 inch 22-gauge spinal needle via left paramedian approach. Using a single pass through the dura, the needle was placed within the thecal sac, with return of clear CSF. 10 mL Isovue M-300 was injected into the thecal sac, with normal opacification of the nerve roots and cauda equina consistent with free flow within the subarachnoid space. The patient was then moved to the trendelenburg position and contrast flowed into the thoracic and cervical spine regions.  I personally performed the lumbar puncture and administered the intrathecal contrast. I also personally supervised acquisition of the myelogram images.  TECHNIQUE: Contiguous axial images were obtained through the  cervical, thoracic, and lumbar spine after the intrathecal infusion of contrast. Coronal and sagittal reconstructions were obtained of the axial image sets.  COMPARISON:  Spine x-rays dated January 13, 2009. MR lumbar spine dated September 28, 2017. CT chest dated May 30, 2017.  FINDINGS: CERVICAL, THORACIC, AND LUMBAR MYELOGRAM FINDINGS:  Cervical spine: Small ventral extradural defects from C4-C5 through C6-C7. No spinal canal stenosis. Underfilling of the left C4 through C7 nerve roots.  Thoracic spine: Prior lower thoracic fusion. Levoscoliosis. Scattered small ventral extradural defects. No spinal canal stenosis.  Lumbar spine: Prior T8-L5 posterior fusion. Interbody grafts from L2-L3 through L4-L5. Dextroscoliosis. Trace retrolisthesis at L1-L2. No dynamic instability. Contrast filling defect on the right at L1. No spinal canal stenosis or nerve root effacement.  CT CERVICAL MYELOGRAM FINDINGS:  Alignment: Trace anterolisthesis at C4-C5 and C7-T1.  Skull base and vertebrae: No acute fracture or other focal pathologic process.  Cord: Normal in bulk and morphology.  Soft tissues and upper chest: Negative.  Disc levels:  C2-C3:  Negative.  C3-C4: Tiny central disc protrusion. Moderate left facet uncovertebral hypertrophy. Moderate to severe left neuroforaminal stenosis. No spinal canal or right neuroforaminal stenosis.  C4-C5: Mild disc height loss without significant disc bulge or herniation. Moderate left and mild right facet uncovertebral hypertrophy. Moderate to severe left neuroforaminal stenosis. No spinal canal or right neuroforaminal stenosis.  C5-C6: Small posterior disc osteophyte complex moderate left and mild right uncovertebral hypertrophy. Borderline mild spinal canal stenosis. Moderate left and mild right neuroforaminal stenosis.  C6-C7: Small circumferential disc osteophyte complex. Severe left and mild right uncovertebral hypertrophy. Mild  left facet arthropathy. Severe left neuroforaminal stenosis. No spinal canal or right neuroforaminal stenosis.  C7-T1: Negative disc. Moderate left facet arthropathy. No stenosis.  CT THORACIC MYELOGRAM FINDINGS:  Alignment: Levoscoliosis apex at T6-T7. Sagittal alignment is maintained.  Vertebrae: Prior lower thoracic fusion beginning at T8 and extending into the lumbar spine. Lucency surrounding the bilateral T8 pedicle screws, consistent with loosening. The left T9 pedicle screw violates the superior endplate. There is no interbody or posterior element fusion. No acute fracture or other focal pathologic process.  Cord: Normal in bulk and morphology.  Paraspinal and other soft tissues: Negative.  Disc levels:  T1-T2: Negative disc.  Mild left facet arthropathy.  No stenosis.  T2-T3: Small circumferential disc osteophyte complex. Mild bilateral neuroforaminal stenosis. No spinal canal stenosis.  T3-T4: Negative disc. Moderate bilateral facet arthropathy. No stenosis.  T4-T5: Tiny shallow right paracentral disc protrusion. Mild left facet arthropathy. No stenosis.  T5-T6: Tiny shallow right paracentral disc protrusion.  No stenosis.  T6-T7: Small right paracentral disc protrusion. Right-sided disc osteophyte complex. Mild left facet arthropathy. No stenosis.  T7-T8: Small calcified right paracentral to far lateral disc protrusion extending into the neural foramen. Mild right facet arthropathy. Mild right neuroforaminal stenosis. No spinal canal or left neuroforaminal stenosis.  T8-T9: Prior posterior fusion. Moderate right paracentral to far lateral disc protrusion extending into the neural foramen. Flattening of the right ventral cord. Mild right neuroforaminal stenosis. No spinal canal or left neuroforaminal stenosis.  T9-T10: Prior posterior fusion.  Minimal disc bulging.  No stenosis.  T10-T11: Prior posterior fusion.  No stenosis.  T11-T12: Tiny right central  calcified disc protrusion.  No stenosis.  CT LUMBAR MYELOGRAM FINDINGS:  Segmentation: Transitional lumbosacral anatomy with sacralization of L5. Hypoplastic ribs at T12. Please note that this is a different numbering system from prior MRI lumbar spine in June 2019.  Alignment: Dextroscoliosis, apex at L1. Right lateral translation of L1 on L2 by 8 mm. 2 mm retrolisthesis at L1-L2. 2 mm anterolisthesis at L4-L5.  Vertebrae: Prior thoracolumbar fusion to the L5 level with interbody grafts from L2-L3 through L4-L5. Partial interbody fusion at these levels. Posterior element fusion from L1-L2 through L4-L5. The L1 pedicle screw extends lateral to the vertebral body. No acute fracture or other focal pathologic process.  Conus medullaris and cauda equina: Conus extends to the T12-L1 level. Conus and cauda equina appear normal.  Paraspinal and other soft tissues: Unchanged postoperative seroma at the laminectomy site extending from T12-L1 through L4-L5. Aortoiliac atherosclerotic vascular disease.  Disc levels:  T12-L1:  Prior posterior fusion.  No stenosis.  L1-L2: Prior posterior decompression and fusion. 8 x 8 x 11 mm ossific density in the midline ventral epidural space posterior to the L1 vertebral body, possibly a calcified disc fragment. No stenosis.  L2-L3:  Prior PLIF.  No residual stenosis.  L3-L4: Prior posterior decompression and PLIF. No residual stenosis.  L4-L5: Prior posterior decompression and PLIF. Unchanged mild residual right neuroforaminal stenosis due to endplate spurring. No spinal canal or left neuroforaminal stenosis.  L5-S1:  Transitional level.  Negative.  IMPRESSION: Cervical spine:  1. Multilevel cervical spondylosis as described above. Advanced left-sided neuroforaminal stenosis from C3-C4 through C6-C7 due to facet uncovertebral hypertrophy.  Thoracic spine:  1. Prior lower thoracic fusion beginning at T8 without interbody or posterior  element fusion. Loosening of the bilateral T8 screws. The left T9 screw violates the superior endplate. 2. Multilevel thoracic spondylosis as described above. Right-sided disc protrusions at T7-T8 and T8-T9 result in mild right neuroforaminal stenosis, as well as flattening of the right ventral cord at T8-T9.  Lumbar spine:  1. Transitional anatomy with sacralization of L5. Hypoplastic ribs at T12. Please note that this is a different numbering system from the prior studies. Correlation with radiographs is recommended prior to any operative intervention. 2. Prior T8-L5 fusion without significant residual stenosis. 3. 8 x 8 x 11 mm ossific density in the midline ventral epidural space posterior to the L1 vertebral body, possibly a calcified disc fragment, without mass effect or impingement.   Electronically Signed By: Obie Dredge M.D. On: 04/02/2019 16:11  CT THORACIC SPINE WO CONTRAST  Narrative CLINICAL DATA:  Scoliosis, rods removed from lumbar area of February 2022, still has pain in thoracic and lumbar spine.  EXAM: CT THORACIC AND LUMBAR SPINE WITHOUT CONTRAST  TECHNIQUE: Multidetector CT imaging of the thoracic and lumbar spine was performed without contrast. Multiplanar  CT image reconstructions were also generated.  RADIATION DOSE REDUCTION: This exam was performed according to the departmental dose-optimization program which includes automated exposure control, adjustment of the mA and/or kV according to patient size and/or use of iterative reconstruction technique.  COMPARISON:  Thoracic and lumbar spine 02/19/2020, thoracic spine radiographs 05/26/2021  FINDINGS: CT THORACIC SPINE FINDINGS  Alignment: There is levocurvature centered at T7 which is similar to the prior CT from 2021. There is no antero or retrolisthesis.  Vertebrae: Vertebral body heights are preserved. There is no evidence of acute injury.  Postsurgical changes reflecting posterior  rod fixation of the thoracic spine extending from T4 through T10 and posterior decompression at T5-T6 through T7-T8 are seen. Since 2021, the construct has been extended superiorly from T4 through T8, the posterior decompressions are new, and the previously seen screws at T11 and T12 has been removed.  There is lucency in the T4 lamina bilaterally around the superior hardware (7-52, 7-57). There is also lucency around the screws at T8, more so on the right (7-49, 7-65). There is no lucency around the other hardware.  Paraspinal and other soft tissues: There is calcified atherosclerotic plaque in the thoracic aorta. Mosaic attenuation in the lungs may reflect small airway disease/air trapping or expiratory phase imaging. Postsurgical changes in the posterior soft tissues are again noted.  Disc levels:  T2-T3: There is degenerative endplate spurring bilaterally contributing to mild bilateral neural foraminal stenosis  T4-T5: Posterior rod fixation beginning at this level. No high-grade spinal canal or neural foraminal stenosis  T5-T6: There is dystrophic calcification in the right neural foramen which is slightly increased from prior and contributes to mild neural foraminal stenosis. No significant spinal canal or left neural foraminal stenosis  T6-T7: Interval posterior decompression. Dystrophic calcification in the right neural foramen resulting in mild neural foraminal stenosis is unchanged. No significant spinal canal or left neural foraminal stenosis  T7-T8: Interval posterior decompression. There is dystrophic calcification in both neural foramina resulting in at least moderate bilateral neural foraminal stenosis without significant spinal canal stenosis. The neural foraminal stenosis is improved on the right but worsened on the left since 2021.  T8-T9: There is calcified disc and/or dystrophic calcification posterior to the disc space and bilateral facet  arthropathy resulting in mild spinal canal stenosis without convincing neural foraminal stenosis, not significantly changed. There is no convincing arthrodesis.  T9-T10: No convincing spinal canal or neural foraminal stenosis. There is possible bilateral posterior element arthrodesis  T10-T11: No convincing spinal canal or neural foraminal stenosis. No convincing arthrodesis.  T11-T12: No convincing spinal canal or neural foraminal stenosis. Previously seen pedicle screws has been removed. No convincing arthrodesis.  CT LUMBAR SPINE FINDINGS  Segmentation: Hypoplastic ribs are again seen at T12 with sacralization of the L5 vertebral body. This is in keeping with numbering convention from the prior lumbar spine CT from 02/19/2020  Alignment: Dextrocurvature centered at L1 with 7 mm right lateral listhesis of L1 on L2 and grade 1 retrolisthesis of L1 on L2 are unchanged  Vertebrae: Vertebral body heights are preserved. There is no evidence of acute injury. Postsurgical changes reflecting posterior instrumented fusion at L2 through L5 are seen with interval removal of pedicle screws at T12 and L1. The bones are diffusely demineralized.  Paraspinal and other soft tissues: There is calcified atherosclerotic plaque in the abdominal aorta. Postsurgical changes in the posterior soft tissues are again noted. There is a simple right renal cyst for which no specific imaging follow-up is  required.  Disc levels:  T12-L1: Status post posterior decompression. There is degenerative endplate spurring without convincing spinal canal or neural foraminal stenosis at the level of the disc space. There is globular calcified material along the posterior L1 endplate which is unchanged. Right-sided posterior element arthrodesis is unchanged.  L1-L2: There is unchanged grade 1 retrolisthesis with interval removal of the L1 pedicle screws. There is posterior element arthrodesis on the left  without convincing fusion across the disc space. There is no significant spinal canal or neural foraminal stenosis.  L2-L3: Status post posterior decompression and instrumented fusion with interbody spacer placement. There is interbody and probable posterior element arthrodesis and no significant spinal canal or neural foraminal stenosis  L3-L4: Status post posterior instrumented fusion and decompression with interbody spacer placement. There is interbody and posterior element arthrodesis with no significant spinal canal or neural foraminal stenosis  L4-L5: Status post posterior instrumented fusion and decompression with interbody spacer placement. There is interbody and posterior element arthrodesis without significant spinal canal or neural foraminal stenosis.  L5-S1: Sacralized.  IMPRESSION: CT THORACIC SPINE IMPRESSION  1. Interval extension of posterior fixation hardware to the T4 level with lucency surrounding the hardware at the T4 lamina as well as around the T8 pedicle screws, left worse than right, suspicious for loosening. 2. Interval posterior decompression at T5-T6 through T7-T8 without convincing spinal canal stenosis at these levels. 3. Dystrophic calcifications in the neural foramina at T5-T6 through T7-T8 resulting in mild neural foraminal stenosis on the right at T5-T6 and T6-T7 and severe bilateral neural foraminal stenosis at T7-T8. At T7-T8, the neural foraminal stenosis is improved on the right but worsened on the left since 2021. 4. Interval removal of the pedicle screws at T11 T12. 5. Similar levocurvature centered at T7.  CT LUMBAR SPINE IMPRESSION  1. Postsurgical changes reflecting posterior instrumented fusion and decompression from L2 through L5 and interval removal of the pedicle screws at T12 and L1. No evidence of hardware loosening or other complication involving the remaining hardware. 2. Solid fusion across the surgical levels without  significant spinal canal or neural foraminal stenosis. 3. Unchanged dextrocurvature centered at L1 with right lateral listhesis and grade 1 retrolisthesis of L1 on L2. 4. No evidence of high-grade spinal canal or neural foraminal stenosis in the lumbar spine. No acute finding.   Electronically Signed By: Lesia Hausen M.D. On: 12/20/2021 12:47    Narrative CLINICAL DATA:  Right lateral hip and anterior leg pain since August. Prior T9-S1 fusion. Last fusion surgery was in October 2019.  EXAM: CERVICAL, THORACIC, AND LUMBAR MYELOGRAM  CT CERVICAL MYELOGRAM  CT THORACIC MYELOGRAM  CT LUMBAR MYELOGRAM  FLUOROSCOPY TIME:  Radiation Exposure Index (as provided by the fluoroscopic device): 18.6 mGy  Fluoroscopy Time:  45 seconds  Number of Acquired Images:  29  PROCEDURE: LUMBAR PUNCTURE FOR CERVICAL, THORACIC, AND LUMBAR MYELOGRAM  After thorough discussion of risks and benefits of the procedure including bleeding, infection, injury to nerves, blood vessels, adjacent structures as well as headache and CSF leak, written and oral informed consent was obtained. Consent was obtained by Dr. Malachi Pro.  Patient was positioned prone on the fluoroscopy table. Local anesthesia was provided with 1% lidocaine without epinephrine after prepped and draped in the usual sterile fashion. Puncture was performed at L4-L5 using a 3 1/2 inch 22-gauge spinal needle via left paramedian approach. Using a single pass through the dura, the needle was placed within the thecal sac, with return of clear CSF.  10 mL Isovue M-300 was injected into the thecal sac, with normal opacification of the nerve roots and cauda equina consistent with free flow within the subarachnoid space. The patient was then moved to the trendelenburg position and contrast flowed into the thoracic and cervical spine regions.  I personally performed the lumbar puncture and administered the intrathecal contrast. I also  personally supervised acquisition of the myelogram images.  TECHNIQUE: Contiguous axial images were obtained through the cervical, thoracic, and lumbar spine after the intrathecal infusion of contrast. Coronal and sagittal reconstructions were obtained of the axial image sets.  COMPARISON:  Spine x-rays dated January 13, 2009. MR lumbar spine dated September 28, 2017. CT chest dated May 30, 2017.  FINDINGS: CERVICAL, THORACIC, AND LUMBAR MYELOGRAM FINDINGS:  Cervical spine: Small ventral extradural defects from C4-C5 through C6-C7. No spinal canal stenosis. Underfilling of the left C4 through C7 nerve roots.  Thoracic spine: Prior lower thoracic fusion. Levoscoliosis. Scattered small ventral extradural defects. No spinal canal stenosis.  Lumbar spine: Prior T8-L5 posterior fusion. Interbody grafts from L2-L3 through L4-L5. Dextroscoliosis. Trace retrolisthesis at L1-L2. No dynamic instability. Contrast filling defect on the right at L1. No spinal canal stenosis or nerve root effacement.  CT CERVICAL MYELOGRAM FINDINGS:  Alignment: Trace anterolisthesis at C4-C5 and C7-T1.  Skull base and vertebrae: No acute fracture or other focal pathologic process.  Cord: Normal in bulk and morphology.  Soft tissues and upper chest: Negative.  Disc levels:  C2-C3:  Negative.  C3-C4: Tiny central disc protrusion. Moderate left facet uncovertebral hypertrophy. Moderate to severe left neuroforaminal stenosis. No spinal canal or right neuroforaminal stenosis.  C4-C5: Mild disc height loss without significant disc bulge or herniation. Moderate left and mild right facet uncovertebral hypertrophy. Moderate to severe left neuroforaminal stenosis. No spinal canal or right neuroforaminal stenosis.  C5-C6: Small posterior disc osteophyte complex moderate left and mild right uncovertebral hypertrophy. Borderline mild spinal canal stenosis. Moderate left and mild right neuroforaminal  stenosis.  C6-C7: Small circumferential disc osteophyte complex. Severe left and mild right uncovertebral hypertrophy. Mild left facet arthropathy. Severe left neuroforaminal stenosis. No spinal canal or right neuroforaminal stenosis.  C7-T1: Negative disc. Moderate left facet arthropathy. No stenosis.  CT THORACIC MYELOGRAM FINDINGS:  Alignment: Levoscoliosis apex at T6-T7. Sagittal alignment is maintained.  Vertebrae: Prior lower thoracic fusion beginning at T8 and extending into the lumbar spine. Lucency surrounding the bilateral T8 pedicle screws, consistent with loosening. The left T9 pedicle screw violates the superior endplate. There is no interbody or posterior element fusion. No acute fracture or other focal pathologic process.  Cord: Normal in bulk and morphology.  Paraspinal and other soft tissues: Negative.  Disc levels:  T1-T2: Negative disc.  Mild left facet arthropathy.  No stenosis.  T2-T3: Small circumferential disc osteophyte complex. Mild bilateral neuroforaminal stenosis. No spinal canal stenosis.  T3-T4: Negative disc. Moderate bilateral facet arthropathy. No stenosis.  T4-T5: Tiny shallow right paracentral disc protrusion. Mild left facet arthropathy. No stenosis.  T5-T6: Tiny shallow right paracentral disc protrusion.  No stenosis.  T6-T7: Small right paracentral disc protrusion. Right-sided disc osteophyte complex. Mild left facet arthropathy. No stenosis.  T7-T8: Small calcified right paracentral to far lateral disc protrusion extending into the neural foramen. Mild right facet arthropathy. Mild right neuroforaminal stenosis. No spinal canal or left neuroforaminal stenosis.  T8-T9: Prior posterior fusion. Moderate right paracentral to far lateral disc protrusion extending into the neural foramen. Flattening of the right ventral cord. Mild right neuroforaminal stenosis. No spinal  canal or left neuroforaminal stenosis.  T9-T10: Prior  posterior fusion.  Minimal disc bulging.  No stenosis.  T10-T11: Prior posterior fusion.  No stenosis.  T11-T12: Tiny right central calcified disc protrusion.  No stenosis.  CT LUMBAR MYELOGRAM FINDINGS:  Segmentation: Transitional lumbosacral anatomy with sacralization of L5. Hypoplastic ribs at T12. Please note that this is a different numbering system from prior MRI lumbar spine in June 2019.  Alignment: Dextroscoliosis, apex at L1. Right lateral translation of L1 on L2 by 8 mm. 2 mm retrolisthesis at L1-L2. 2 mm anterolisthesis at L4-L5.  Vertebrae: Prior thoracolumbar fusion to the L5 level with interbody grafts from L2-L3 through L4-L5. Partial interbody fusion at these levels. Posterior element fusion from L1-L2 through L4-L5. The L1 pedicle screw extends lateral to the vertebral body. No acute fracture or other focal pathologic process.  Conus medullaris and cauda equina: Conus extends to the T12-L1 level. Conus and cauda equina appear normal.  Paraspinal and other soft tissues: Unchanged postoperative seroma at the laminectomy site extending from T12-L1 through L4-L5. Aortoiliac atherosclerotic vascular disease.  Disc levels:  T12-L1:  Prior posterior fusion.  No stenosis.  L1-L2: Prior posterior decompression and fusion. 8 x 8 x 11 mm ossific density in the midline ventral epidural space posterior to the L1 vertebral body, possibly a calcified disc fragment. No stenosis.  L2-L3:  Prior PLIF.  No residual stenosis.  L3-L4: Prior posterior decompression and PLIF. No residual stenosis.  L4-L5: Prior posterior decompression and PLIF. Unchanged mild residual right neuroforaminal stenosis due to endplate spurring. No spinal canal or left neuroforaminal stenosis.  L5-S1:  Transitional level.  Negative.  IMPRESSION: Cervical spine:  1. Multilevel cervical spondylosis as described above. Advanced left-sided neuroforaminal stenosis from C3-C4 through C6-C7 due  to facet uncovertebral hypertrophy.  Thoracic spine:  1. Prior lower thoracic fusion beginning at T8 without interbody or posterior element fusion. Loosening of the bilateral T8 screws. The left T9 screw violates the superior endplate. 2. Multilevel thoracic spondylosis as described above. Right-sided disc protrusions at T7-T8 and T8-T9 result in mild right neuroforaminal stenosis, as well as flattening of the right ventral cord at T8-T9.  Lumbar spine:  1. Transitional anatomy with sacralization of L5. Hypoplastic ribs at T12. Please note that this is a different numbering system from the prior studies. Correlation with radiographs is recommended prior to any operative intervention. 2. Prior T8-L5 fusion without significant residual stenosis. 3. 8 x 8 x 11 mm ossific density in the midline ventral epidural space posterior to the L1 vertebral body, possibly a calcified disc fragment, without mass effect or impingement.   Electronically Signed By: Obie Dredge M.D. On: 04/02/2019 16:11  Thoracic DG 2-3 views: Results for orders placed during the hospital encounter of 05/29/20  DG Thoracic Spine 2 View  Narrative CLINICAL DATA:  Loosening of hardware in spine. Additional history provided: Removal of hardware, replacement of thoracic 8, thoracic 9, thoracic 10 screws, right thoracic 6-7, right thoracic 7-8 laminectomies and placement of thoracic hooks. Provided fluoroscopy time 24 seconds (7.55 mGy).  EXAM: THORACIC SPINE 2 VIEWS; DG C-ARM 1-60 MIN  COMPARISON:  CT of the thoracic spine 02/19/2020.  FINDINGS: PA and lateral view intraoperative fluoroscopic images of the thoracic spine are submitted, 6 images total. The images demonstrate bilateral pedicle screws at what are presumed to be the T8, T9 and T10 levels (given the provided history). Associated vertical interconnecting rods bilaterally which extend superiorly to the site of thoracic hooks at presumably the  T4-T5 level.  IMPRESSION: Six intraoperative fluoroscopic images of the thoracic spine, as described.   Electronically Signed By: Jackey Loge DO On: 05/29/2020 12:39  Narrative CLINICAL DATA:  Decompression and fusion of the thoracic spine, T9, to the lumbar spine, L2.  EXAM: DG C-ARM 61-120 MIN; THORACOLUMBAR SPINE - 2 VIEW  COMPARISON:  Lumbar MRI, 09/28/2017  FINDINGS: Portable operative imaging demonstrates placement of pedicle screws along the mid to lower thoracic spine to the L2 level. Screws appear well-seated and well-positioned.  IMPRESSION: Imaging provided for thoracolumbar posterior fusion. Visualized orthopedic hardware appears well positioned.   Electronically Signed By: Amie Portland M.D. On: 02/15/2018 13:55  Lumbosacral Imaging: Lumbar MR wo contrast: Results for orders placed during the hospital encounter of 08/29/08  MR Lumbar Spine Wo Contrast  Narrative Clinical Data: Right-sided back pain.  Right leg and foot pain.  MRI LUMBAR SPINE WITHOUT CONTRAST  Technique:  Multiplanar and multiecho pulse sequences of the lumbar spine were obtained without intravenous contrast.  Comparison: Lumbar radiographs 08/29/2008.  Findings: Mild dextroscoliosis at L1-2.  Normal lumbar alignment. Negative for fracture or mass.  The conus medullaris is normal.  L1-2:  Negative  L2-3:  Negative  L3-4:  Mild disc and facet degeneration without stenosis.  L4-5:  Disc degeneration with diffuse bulging of the disc.  There is moderate to advanced facet overgrowth bilaterally.  There is moderately severe spinal stenosis.  There is mild foraminal narrowing bilaterally.  L5-S1:  There is anterior slip of L5 on S1 on the right side only due to facet degeneration. There is a synovial cyst projecting into the canal from the right L5-S1 facet. There is moderate right foraminal encroachment and there may be some impingement of the right L5 nerve root.  There is  impingement of the right S1 nerve root in the lateral recess due to a synovial cyst and facet overgrowth.  There is diffuse disc bulging.  There is mild facet degeneration on the left.  There is mild central canal stenosis.  IMPRESSION: There is moderate spinal stenosis at L4-5 due to disc bulging and moderate to severe facet degeneration.  There is right foraminal encroachment at L5-S1 with impingement of the right L5 nerve root. There is impingement of the right S1 nerve root in the lateral recess due to a synovial cyst and facet overgrowth on the right.  Provider: Adin Hector    CT LUMBAR SPINE WO CONTRAST  Narrative CLINICAL DATA:  Scoliosis, rods removed from lumbar area of February 2022, still has pain in thoracic and lumbar spine.  EXAM: CT THORACIC AND LUMBAR SPINE WITHOUT CONTRAST  TECHNIQUE: Multidetector CT imaging of the thoracic and lumbar spine was performed without contrast. Multiplanar CT image reconstructions were also generated.  RADIATION DOSE REDUCTION: This exam was performed according to the departmental dose-optimization program which includes automated exposure control, adjustment of the mA and/or kV according to patient size and/or use of iterative reconstruction technique.  COMPARISON:  Thoracic and lumbar spine 02/19/2020, thoracic spine radiographs 05/26/2021  FINDINGS: CT THORACIC SPINE FINDINGS  Alignment: There is levocurvature centered at T7 which is similar to the prior CT from 2021. There is no antero or retrolisthesis.  Vertebrae: Vertebral body heights are preserved. There is no evidence of acute injury.  Postsurgical changes reflecting posterior rod fixation of the thoracic spine extending from T4 through T10 and posterior decompression at T5-T6 through T7-T8 are seen. Since 2021, the construct has been extended superiorly from T4 through T8, the posterior  decompressions are new, and the previously seen screws at T11 and  T12 has been removed.  There is lucency in the T4 lamina bilaterally around the superior hardware (7-52, 7-57). There is also lucency around the screws at T8, more so on the right (7-49, 7-65). There is no lucency around the other hardware.  Paraspinal and other soft tissues: There is calcified atherosclerotic plaque in the thoracic aorta. Mosaic attenuation in the lungs may reflect small airway disease/air trapping or expiratory phase imaging. Postsurgical changes in the posterior soft tissues are again noted.  Disc levels:  T2-T3: There is degenerative endplate spurring bilaterally contributing to mild bilateral neural foraminal stenosis  T4-T5: Posterior rod fixation beginning at this level. No high-grade spinal canal or neural foraminal stenosis  T5-T6: There is dystrophic calcification in the right neural foramen which is slightly increased from prior and contributes to mild neural foraminal stenosis. No significant spinal canal or left neural foraminal stenosis  T6-T7: Interval posterior decompression. Dystrophic calcification in the right neural foramen resulting in mild neural foraminal stenosis is unchanged. No significant spinal canal or left neural foraminal stenosis  T7-T8: Interval posterior decompression. There is dystrophic calcification in both neural foramina resulting in at least moderate bilateral neural foraminal stenosis without significant spinal canal stenosis. The neural foraminal stenosis is improved on the right but worsened on the left since 2021.  T8-T9: There is calcified disc and/or dystrophic calcification posterior to the disc space and bilateral facet arthropathy resulting in mild spinal canal stenosis without convincing neural foraminal stenosis, not significantly changed. There is no convincing arthrodesis.  T9-T10: No convincing spinal canal or neural foraminal stenosis. There is possible bilateral posterior element  arthrodesis  T10-T11: No convincing spinal canal or neural foraminal stenosis. No convincing arthrodesis.  T11-T12: No convincing spinal canal or neural foraminal stenosis. Previously seen pedicle screws has been removed. No convincing arthrodesis.  CT LUMBAR SPINE FINDINGS  Segmentation: Hypoplastic ribs are again seen at T12 with sacralization of the L5 vertebral body. This is in keeping with numbering convention from the prior lumbar spine CT from 02/19/2020  Alignment: Dextrocurvature centered at L1 with 7 mm right lateral listhesis of L1 on L2 and grade 1 retrolisthesis of L1 on L2 are unchanged  Vertebrae: Vertebral body heights are preserved. There is no evidence of acute injury. Postsurgical changes reflecting posterior instrumented fusion at L2 through L5 are seen with interval removal of pedicle screws at T12 and L1. The bones are diffusely demineralized.  Paraspinal and other soft tissues: There is calcified atherosclerotic plaque in the abdominal aorta. Postsurgical changes in the posterior soft tissues are again noted. There is a simple right renal cyst for which no specific imaging follow-up is required.  Disc levels:  T12-L1: Status post posterior decompression. There is degenerative endplate spurring without convincing spinal canal or neural foraminal stenosis at the level of the disc space. There is globular calcified material along the posterior L1 endplate which is unchanged. Right-sided posterior element arthrodesis is unchanged.  L1-L2: There is unchanged grade 1 retrolisthesis with interval removal of the L1 pedicle screws. There is posterior element arthrodesis on the left without convincing fusion across the disc space. There is no significant spinal canal or neural foraminal stenosis.  L2-L3: Status post posterior decompression and instrumented fusion with interbody spacer placement. There is interbody and probable posterior element arthrodesis  and no significant spinal canal or neural foraminal stenosis  L3-L4: Status post posterior instrumented fusion and decompression with interbody spacer placement. There  is interbody and posterior element arthrodesis with no significant spinal canal or neural foraminal stenosis  L4-L5: Status post posterior instrumented fusion and decompression with interbody spacer placement. There is interbody and posterior element arthrodesis without significant spinal canal or neural foraminal stenosis.  L5-S1: Sacralized.  IMPRESSION: CT THORACIC SPINE IMPRESSION  1. Interval extension of posterior fixation hardware to the T4 level with lucency surrounding the hardware at the T4 lamina as well as around the T8 pedicle screws, left worse than right, suspicious for loosening. 2. Interval posterior decompression at T5-T6 through T7-T8 without convincing spinal canal stenosis at these levels. 3. Dystrophic calcifications in the neural foramina at T5-T6 through T7-T8 resulting in mild neural foraminal stenosis on the right at T5-T6 and T6-T7 and severe bilateral neural foraminal stenosis at T7-T8. At T7-T8, the neural foraminal stenosis is improved on the right but worsened on the left since 2021. 4. Interval removal of the pedicle screws at T11 T12. 5. Similar levocurvature centered at T7.  CT LUMBAR SPINE IMPRESSION  1. Postsurgical changes reflecting posterior instrumented fusion and decompression from L2 through L5 and interval removal of the pedicle screws at T12 and L1. No evidence of hardware loosening or other complication involving the remaining hardware. 2. Solid fusion across the surgical levels without significant spinal canal or neural foraminal stenosis. 3. Unchanged dextrocurvature centered at L1 with right lateral listhesis and grade 1 retrolisthesis of L1 on L2. 4. No evidence of high-grade spinal canal or neural foraminal stenosis in the lumbar spine. No acute  finding.   Electronically Signed By: Lesia Hausen M.D. On: 12/20/2021 12:47   Narrative CLINICAL DATA:  Right lateral hip and anterior leg pain since August. Prior T9-S1 fusion. Last fusion surgery was in October 2019.  EXAM: CERVICAL, THORACIC, AND LUMBAR MYELOGRAM  CT CERVICAL MYELOGRAM  CT THORACIC MYELOGRAM  CT LUMBAR MYELOGRAM  FLUOROSCOPY TIME:  Radiation Exposure Index (as provided by the fluoroscopic device): 18.6 mGy  Fluoroscopy Time:  45 seconds  Number of Acquired Images:  29  PROCEDURE: LUMBAR PUNCTURE FOR CERVICAL, THORACIC, AND LUMBAR MYELOGRAM  After thorough discussion of risks and benefits of the procedure including bleeding, infection, injury to nerves, blood vessels, adjacent structures as well as headache and CSF leak, written and oral informed consent was obtained. Consent was obtained by Dr. Malachi Pro.  Patient was positioned prone on the fluoroscopy table. Local anesthesia was provided with 1% lidocaine without epinephrine after prepped and draped in the usual sterile fashion. Puncture was performed at L4-L5 using a 3 1/2 inch 22-gauge spinal needle via left paramedian approach. Using a single pass through the dura, the needle was placed within the thecal sac, with return of clear CSF. 10 mL Isovue M-300 was injected into the thecal sac, with normal opacification of the nerve roots and cauda equina consistent with free flow within the subarachnoid space. The patient was then moved to the trendelenburg position and contrast flowed into the thoracic and cervical spine regions.  I personally performed the lumbar puncture and administered the intrathecal contrast. I also personally supervised acquisition of the myelogram images.  TECHNIQUE: Contiguous axial images were obtained through the cervical, thoracic, and lumbar spine after the intrathecal infusion of contrast. Coronal and sagittal reconstructions were obtained of the axial  image sets.  COMPARISON:  Spine x-rays dated January 13, 2009. MR lumbar spine dated September 28, 2017. CT chest dated May 30, 2017.  FINDINGS: CERVICAL, THORACIC, AND LUMBAR MYELOGRAM FINDINGS:  Cervical spine: Small ventral extradural  defects from C4-C5 through C6-C7. No spinal canal stenosis. Underfilling of the left C4 through C7 nerve roots.  Thoracic spine: Prior lower thoracic fusion. Levoscoliosis. Scattered small ventral extradural defects. No spinal canal stenosis.  Lumbar spine: Prior T8-L5 posterior fusion. Interbody grafts from L2-L3 through L4-L5. Dextroscoliosis. Trace retrolisthesis at L1-L2. No dynamic instability. Contrast filling defect on the right at L1. No spinal canal stenosis or nerve root effacement.  CT CERVICAL MYELOGRAM FINDINGS:  Alignment: Trace anterolisthesis at C4-C5 and C7-T1.  Skull base and vertebrae: No acute fracture or other focal pathologic process.  Cord: Normal in bulk and morphology.  Soft tissues and upper chest: Negative.  Disc levels:  C2-C3:  Negative.  C3-C4: Tiny central disc protrusion. Moderate left facet uncovertebral hypertrophy. Moderate to severe left neuroforaminal stenosis. No spinal canal or right neuroforaminal stenosis.  C4-C5: Mild disc height loss without significant disc bulge or herniation. Moderate left and mild right facet uncovertebral hypertrophy. Moderate to severe left neuroforaminal stenosis. No spinal canal or right neuroforaminal stenosis.  C5-C6: Small posterior disc osteophyte complex moderate left and mild right uncovertebral hypertrophy. Borderline mild spinal canal stenosis. Moderate left and mild right neuroforaminal stenosis.  C6-C7: Small circumferential disc osteophyte complex. Severe left and mild right uncovertebral hypertrophy. Mild left facet arthropathy. Severe left neuroforaminal stenosis. No spinal canal or right neuroforaminal stenosis.  C7-T1: Negative disc. Moderate  left facet arthropathy. No stenosis.  CT THORACIC MYELOGRAM FINDINGS:  Alignment: Levoscoliosis apex at T6-T7. Sagittal alignment is maintained.  Vertebrae: Prior lower thoracic fusion beginning at T8 and extending into the lumbar spine. Lucency surrounding the bilateral T8 pedicle screws, consistent with loosening. The left T9 pedicle screw violates the superior endplate. There is no interbody or posterior element fusion. No acute fracture or other focal pathologic process.  Cord: Normal in bulk and morphology.  Paraspinal and other soft tissues: Negative.  Disc levels:  T1-T2: Negative disc.  Mild left facet arthropathy.  No stenosis.  T2-T3: Small circumferential disc osteophyte complex. Mild bilateral neuroforaminal stenosis. No spinal canal stenosis.  T3-T4: Negative disc. Moderate bilateral facet arthropathy. No stenosis.  T4-T5: Tiny shallow right paracentral disc protrusion. Mild left facet arthropathy. No stenosis.  T5-T6: Tiny shallow right paracentral disc protrusion.  No stenosis.  T6-T7: Small right paracentral disc protrusion. Right-sided disc osteophyte complex. Mild left facet arthropathy. No stenosis.  T7-T8: Small calcified right paracentral to far lateral disc protrusion extending into the neural foramen. Mild right facet arthropathy. Mild right neuroforaminal stenosis. No spinal canal or left neuroforaminal stenosis.  T8-T9: Prior posterior fusion. Moderate right paracentral to far lateral disc protrusion extending into the neural foramen. Flattening of the right ventral cord. Mild right neuroforaminal stenosis. No spinal canal or left neuroforaminal stenosis.  T9-T10: Prior posterior fusion.  Minimal disc bulging.  No stenosis.  T10-T11: Prior posterior fusion.  No stenosis.  T11-T12: Tiny right central calcified disc protrusion.  No stenosis.  CT LUMBAR MYELOGRAM FINDINGS:  Segmentation: Transitional lumbosacral anatomy with sacralization  of L5. Hypoplastic ribs at T12. Please note that this is a different numbering system from prior MRI lumbar spine in June 2019.  Alignment: Dextroscoliosis, apex at L1. Right lateral translation of L1 on L2 by 8 mm. 2 mm retrolisthesis at L1-L2. 2 mm anterolisthesis at L4-L5.  Vertebrae: Prior thoracolumbar fusion to the L5 level with interbody grafts from L2-L3 through L4-L5. Partial interbody fusion at these levels. Posterior element fusion from L1-L2 through L4-L5. The L1 pedicle screw extends lateral to the vertebral  body. No acute fracture or other focal pathologic process.  Conus medullaris and cauda equina: Conus extends to the T12-L1 level. Conus and cauda equina appear normal.  Paraspinal and other soft tissues: Unchanged postoperative seroma at the laminectomy site extending from T12-L1 through L4-L5. Aortoiliac atherosclerotic vascular disease.  Disc levels:  T12-L1:  Prior posterior fusion.  No stenosis.  L1-L2: Prior posterior decompression and fusion. 8 x 8 x 11 mm ossific density in the midline ventral epidural space posterior to the L1 vertebral body, possibly a calcified disc fragment. No stenosis.  L2-L3:  Prior PLIF.  No residual stenosis.  L3-L4: Prior posterior decompression and PLIF. No residual stenosis.  L4-L5: Prior posterior decompression and PLIF. Unchanged mild residual right neuroforaminal stenosis due to endplate spurring. No spinal canal or left neuroforaminal stenosis.  L5-S1:  Transitional level.  Negative.  IMPRESSION: Cervical spine:  1. Multilevel cervical spondylosis as described above. Advanced left-sided neuroforaminal stenosis from C3-C4 through C6-C7 due to facet uncovertebral hypertrophy.  Thoracic spine:  1. Prior lower thoracic fusion beginning at T8 without interbody or posterior element fusion. Loosening of the bilateral T8 screws. The left T9 screw violates the superior endplate. 2. Multilevel thoracic spondylosis as  described above. Right-sided disc protrusions at T7-T8 and T8-T9 result in mild right neuroforaminal stenosis, as well as flattening of the right ventral cord at T8-T9.  Lumbar spine:  1. Transitional anatomy with sacralization of L5. Hypoplastic ribs at T12. Please note that this is a different numbering system from the prior studies. Correlation with radiographs is recommended prior to any operative intervention. 2. Prior T8-L5 fusion without significant residual stenosis. 3. 8 x 8 x 11 mm ossific density in the midline ventral epidural space posterior to the L1 vertebral body, possibly a calcified disc fragment, without mass effect or impingement.   Electronically Signed By: Obie Dredge M.D. On: 04/02/2019 16:11  Lumbar DG 1V: Results for orders placed during the hospital encounter of 10/25/10  DG Lumbar Spine 1 View  Narrative *RADIOLOGY REPORT*  Clinical Data: L3 - S1 redo laminectomy  LUMBAR SPINE - 1 VIEW  Comparison: Lumbar spine CT - 09/27/2010; lumbar spine radiographs - 08/30/2010  Findings:  A single lateral intraoperative radiograph of the lumbar spine is provided for review. Overall linear metallic surgical instruments overlie the lumbar spine with tips projected over the L3 through S1 spinous processes. Vertebral body heights are preserved. Redemonstrated multilevel DDD, most conspicuous at L5 - S1 and L4 - L5.  IMPRESSION: Intraoperative lateral lumbar spine radiograph as above.  Original Report Authenticated By: [id number]   Narrative CLINICAL DATA:  Lumbar fusion  EXAM: DG C-ARM GT 120 MIN; LUMBAR SPINE - 2-3 VIEW  TECHNIQUE: Four digital C-arm fluoroscopic images obtained intraoperatively are submitted for postoperative interpretation.  FLUOROSCOPY TIME:  0 min 38.3 second  COMPARISON:  Lumbar radiographs 09/16/2013, MRI lumbar spine 08/20/2013  FINDINGS: Five lumbar vertebrae labeled on prior MR.  Images demonstrate placement of  BILATERAL pedicle screws and bars from L2-L5.  Disc prostheses present at L3-L4 and L4-L5.  Bones severely demineralized.  Disc space narrowing L2-L3.  L5 incompletely visualized.  IMPRESSION: Fluoroscopic images obtained during L2-L5 posterior fusion as above.   Electronically Signed By: Ulyses Southward M.D. On: 11/28/2013 16:44  Lumbar DG (Complete) 4+V: Results for orders placed during the hospital encounter of 08/29/08  DG Lumbar Spine Complete  Narrative Clinical Data: Back pain and right hip pain.  LUMBAR SPINE - COMPLETE 4+ VIEW  Comparison: None  Findings: The lateral film demonstrates normal overall alignment of the lumbar vertebral bodies.  Very slight anterior listhesis of L4 and L5 due to advanced facet disease.  No pars defects are seen. There is a right convex lumbar scoliotic curvature with a slight rotatory component.  No acute bony findings.  The visualized bony pelvis is intact.  IMPRESSION:  1.  Normal alignment and no acute bony findings. 2.  Facet degenerative changes in the lower lumbar spine in particular along with degenerative disc disease at L4-5 and L5-S1. 3.  Right convex scoliotic curvature.  Provider: Coral Else  DG MYELOGRAPHY LUMBAR INJ MULTI REGION  Narrative CLINICAL DATA:  Right lateral hip and anterior leg pain since August. Prior T9-S1 fusion. Last fusion surgery was in October 2019.  EXAM: CERVICAL, THORACIC, AND LUMBAR MYELOGRAM  CT CERVICAL MYELOGRAM  CT THORACIC MYELOGRAM  CT LUMBAR MYELOGRAM  FLUOROSCOPY TIME:  Radiation Exposure Index (as provided by the fluoroscopic device): 18.6 mGy  Fluoroscopy Time:  45 seconds  Number of Acquired Images:  29  PROCEDURE: LUMBAR PUNCTURE FOR CERVICAL, THORACIC, AND LUMBAR MYELOGRAM  After thorough discussion of risks and benefits of the procedure including bleeding, infection, injury to nerves, blood vessels, adjacent structures as well as headache and CSF leak, written  and oral informed consent was obtained. Consent was obtained by Dr. Malachi Pro.  Patient was positioned prone on the fluoroscopy table. Local anesthesia was provided with 1% lidocaine without epinephrine after prepped and draped in the usual sterile fashion. Puncture was performed at L4-L5 using a 3 1/2 inch 22-gauge spinal needle via left paramedian approach. Using a single pass through the dura, the needle was placed within the thecal sac, with return of clear CSF. 10 mL Isovue M-300 was injected into the thecal sac, with normal opacification of the nerve roots and cauda equina consistent with free flow within the subarachnoid space. The patient was then moved to the trendelenburg position and contrast flowed into the thoracic and cervical spine regions.  I personally performed the lumbar puncture and administered the intrathecal contrast. I also personally supervised acquisition of the myelogram images.  TECHNIQUE: Contiguous axial images were obtained through the cervical, thoracic, and lumbar spine after the intrathecal infusion of contrast. Coronal and sagittal reconstructions were obtained of the axial image sets.  COMPARISON:  Spine x-rays dated January 13, 2009. MR lumbar spine dated September 28, 2017. CT chest dated May 30, 2017.  FINDINGS: CERVICAL, THORACIC, AND LUMBAR MYELOGRAM FINDINGS:  Cervical spine: Small ventral extradural defects from C4-C5 through C6-C7. No spinal canal stenosis. Underfilling of the left C4 through C7 nerve roots.  Thoracic spine: Prior lower thoracic fusion. Levoscoliosis. Scattered small ventral extradural defects. No spinal canal stenosis.  Lumbar spine: Prior T8-L5 posterior fusion. Interbody grafts from L2-L3 through L4-L5. Dextroscoliosis. Trace retrolisthesis at L1-L2. No dynamic instability. Contrast filling defect on the right at L1. No spinal canal stenosis or nerve root effacement.  CT CERVICAL MYELOGRAM  FINDINGS:  Alignment: Trace anterolisthesis at C4-C5 and C7-T1.  Skull base and vertebrae: No acute fracture or other focal pathologic process.  Cord: Normal in bulk and morphology.  Soft tissues and upper chest: Negative.  Disc levels:  C2-C3:  Negative.  C3-C4: Tiny central disc protrusion. Moderate left facet uncovertebral hypertrophy. Moderate to severe left neuroforaminal stenosis. No spinal canal or right neuroforaminal stenosis.  C4-C5: Mild disc height loss without significant disc bulge or herniation. Moderate left and mild right facet uncovertebral hypertrophy. Moderate to severe left neuroforaminal  stenosis. No spinal canal or right neuroforaminal stenosis.  C5-C6: Small posterior disc osteophyte complex moderate left and mild right uncovertebral hypertrophy. Borderline mild spinal canal stenosis. Moderate left and mild right neuroforaminal stenosis.  C6-C7: Small circumferential disc osteophyte complex. Severe left and mild right uncovertebral hypertrophy. Mild left facet arthropathy. Severe left neuroforaminal stenosis. No spinal canal or right neuroforaminal stenosis.  C7-T1: Negative disc. Moderate left facet arthropathy. No stenosis.  CT THORACIC MYELOGRAM FINDINGS:  Alignment: Levoscoliosis apex at T6-T7. Sagittal alignment is maintained.  Vertebrae: Prior lower thoracic fusion beginning at T8 and extending into the lumbar spine. Lucency surrounding the bilateral T8 pedicle screws, consistent with loosening. The left T9 pedicle screw violates the superior endplate. There is no interbody or posterior element fusion. No acute fracture or other focal pathologic process.  Cord: Normal in bulk and morphology.  Paraspinal and other soft tissues: Negative.  Disc levels:  T1-T2: Negative disc.  Mild left facet arthropathy.  No stenosis.  T2-T3: Small circumferential disc osteophyte complex. Mild bilateral neuroforaminal stenosis. No spinal canal  stenosis.  T3-T4: Negative disc. Moderate bilateral facet arthropathy. No stenosis.  T4-T5: Tiny shallow right paracentral disc protrusion. Mild left facet arthropathy. No stenosis.  T5-T6: Tiny shallow right paracentral disc protrusion.  No stenosis.  T6-T7: Small right paracentral disc protrusion. Right-sided disc osteophyte complex. Mild left facet arthropathy. No stenosis.  T7-T8: Small calcified right paracentral to far lateral disc protrusion extending into the neural foramen. Mild right facet arthropathy. Mild right neuroforaminal stenosis. No spinal canal or left neuroforaminal stenosis.  T8-T9: Prior posterior fusion. Moderate right paracentral to far lateral disc protrusion extending into the neural foramen. Flattening of the right ventral cord. Mild right neuroforaminal stenosis. No spinal canal or left neuroforaminal stenosis.  T9-T10: Prior posterior fusion.  Minimal disc bulging.  No stenosis.  T10-T11: Prior posterior fusion.  No stenosis.  T11-T12: Tiny right central calcified disc protrusion.  No stenosis.  CT LUMBAR MYELOGRAM FINDINGS:  Segmentation: Transitional lumbosacral anatomy with sacralization of L5. Hypoplastic ribs at T12. Please note that this is a different numbering system from prior MRI lumbar spine in June 2019.  Alignment: Dextroscoliosis, apex at L1. Right lateral translation of L1 on L2 by 8 mm. 2 mm retrolisthesis at L1-L2. 2 mm anterolisthesis at L4-L5.  Vertebrae: Prior thoracolumbar fusion to the L5 level with interbody grafts from L2-L3 through L4-L5. Partial interbody fusion at these levels. Posterior element fusion from L1-L2 through L4-L5. The L1 pedicle screw extends lateral to the vertebral body. No acute fracture or other focal pathologic process.  Conus medullaris and cauda equina: Conus extends to the T12-L1 level. Conus and cauda equina appear normal.  Paraspinal and other soft tissues: Unchanged postoperative seroma  at the laminectomy site extending from T12-L1 through L4-L5. Aortoiliac atherosclerotic vascular disease.  Disc levels:  T12-L1:  Prior posterior fusion.  No stenosis.  L1-L2: Prior posterior decompression and fusion. 8 x 8 x 11 mm ossific density in the midline ventral epidural space posterior to the L1 vertebral body, possibly a calcified disc fragment. No stenosis.  L2-L3:  Prior PLIF.  No residual stenosis.  L3-L4: Prior posterior decompression and PLIF. No residual stenosis.  L4-L5: Prior posterior decompression and PLIF. Unchanged mild residual right neuroforaminal stenosis due to endplate spurring. No spinal canal or left neuroforaminal stenosis.  L5-S1:  Transitional level.  Negative.  IMPRESSION: Cervical spine:  1. Multilevel cervical spondylosis as described above. Advanced left-sided neuroforaminal stenosis from C3-C4 through C6-C7 due to facet  uncovertebral hypertrophy.  Thoracic spine:  1. Prior lower thoracic fusion beginning at T8 without interbody or posterior element fusion. Loosening of the bilateral T8 screws. The left T9 screw violates the superior endplate. 2. Multilevel thoracic spondylosis as described above. Right-sided disc protrusions at T7-T8 and T8-T9 result in mild right neuroforaminal stenosis, as well as flattening of the right ventral cord at T8-T9.  Lumbar spine:  1. Transitional anatomy with sacralization of L5. Hypoplastic ribs at T12. Please note that this is a different numbering system from the prior studies. Correlation with radiographs is recommended prior to any operative intervention. 2. Prior T8-L5 fusion without significant residual stenosis. 3. 8 x 8 x 11 mm ossific density in the midline ventral epidural space posterior to the L1 vertebral body, possibly a calcified disc fragment, without mass effect or impingement.   Electronically Signed By: Obie Dredge M.D. On: 04/02/2019 16:11  DG MYELOGRAPHY LUMBAR INJ  MULTI REGION  Narrative CLINICAL DATA:  Right lateral hip and anterior leg pain since August. Prior T9-S1 fusion. Last fusion surgery was in October 2019.  EXAM: CERVICAL, THORACIC, AND LUMBAR MYELOGRAM  CT CERVICAL MYELOGRAM  CT THORACIC MYELOGRAM  CT LUMBAR MYELOGRAM  FLUOROSCOPY TIME:  Radiation Exposure Index (as provided by the fluoroscopic device): 18.6 mGy  Fluoroscopy Time:  45 seconds  Number of Acquired Images:  29  PROCEDURE: LUMBAR PUNCTURE FOR CERVICAL, THORACIC, AND LUMBAR MYELOGRAM  After thorough discussion of risks and benefits of the procedure including bleeding, infection, injury to nerves, blood vessels, adjacent structures as well as headache and CSF leak, written and oral informed consent was obtained. Consent was obtained by Dr. Malachi Pro.  Patient was positioned prone on the fluoroscopy table. Local anesthesia was provided with 1% lidocaine without epinephrine after prepped and draped in the usual sterile fashion. Puncture was performed at L4-L5 using a 3 1/2 inch 22-gauge spinal needle via left paramedian approach. Using a single pass through the dura, the needle was placed within the thecal sac, with return of clear CSF. 10 mL Isovue M-300 was injected into the thecal sac, with normal opacification of the nerve roots and cauda equina consistent with free flow within the subarachnoid space. The patient was then moved to the trendelenburg position and contrast flowed into the thoracic and cervical spine regions.  I personally performed the lumbar puncture and administered the intrathecal contrast. I also personally supervised acquisition of the myelogram images.  TECHNIQUE: Contiguous axial images were obtained through the cervical, thoracic, and lumbar spine after the intrathecal infusion of contrast. Coronal and sagittal reconstructions were obtained of the axial image sets.  COMPARISON:  Spine x-rays dated January 13, 2009. MR  lumbar spine dated September 28, 2017. CT chest dated May 30, 2017.  FINDINGS: CERVICAL, THORACIC, AND LUMBAR MYELOGRAM FINDINGS:  Cervical spine: Small ventral extradural defects from C4-C5 through C6-C7. No spinal canal stenosis. Underfilling of the left C4 through C7 nerve roots.  Thoracic spine: Prior lower thoracic fusion. Levoscoliosis. Scattered small ventral extradural defects. No spinal canal stenosis.  Lumbar spine: Prior T8-L5 posterior fusion. Interbody grafts from L2-L3 through L4-L5. Dextroscoliosis. Trace retrolisthesis at L1-L2. No dynamic instability. Contrast filling defect on the right at L1. No spinal canal stenosis or nerve root effacement.  CT CERVICAL MYELOGRAM FINDINGS:  Alignment: Trace anterolisthesis at C4-C5 and C7-T1.  Skull base and vertebrae: No acute fracture or other focal pathologic process.  Cord: Normal in bulk and morphology.  Soft tissues and upper chest: Negative.  Disc levels:  C2-C3:  Negative.  C3-C4: Tiny central disc protrusion. Moderate left facet uncovertebral hypertrophy. Moderate to severe left neuroforaminal stenosis. No spinal canal or right neuroforaminal stenosis.  C4-C5: Mild disc height loss without significant disc bulge or herniation. Moderate left and mild right facet uncovertebral hypertrophy. Moderate to severe left neuroforaminal stenosis. No spinal canal or right neuroforaminal stenosis.  C5-C6: Small posterior disc osteophyte complex moderate left and mild right uncovertebral hypertrophy. Borderline mild spinal canal stenosis. Moderate left and mild right neuroforaminal stenosis.  C6-C7: Small circumferential disc osteophyte complex. Severe left and mild right uncovertebral hypertrophy. Mild left facet arthropathy. Severe left neuroforaminal stenosis. No spinal canal or right neuroforaminal stenosis.  C7-T1: Negative disc. Moderate left facet arthropathy. No stenosis.  CT THORACIC MYELOGRAM  FINDINGS:  Alignment: Levoscoliosis apex at T6-T7. Sagittal alignment is maintained.  Vertebrae: Prior lower thoracic fusion beginning at T8 and extending into the lumbar spine. Lucency surrounding the bilateral T8 pedicle screws, consistent with loosening. The left T9 pedicle screw violates the superior endplate. There is no interbody or posterior element fusion. No acute fracture or other focal pathologic process.  Cord: Normal in bulk and morphology.  Paraspinal and other soft tissues: Negative.  Disc levels:  T1-T2: Negative disc.  Mild left facet arthropathy.  No stenosis.  T2-T3: Small circumferential disc osteophyte complex. Mild bilateral neuroforaminal stenosis. No spinal canal stenosis.  T3-T4: Negative disc. Moderate bilateral facet arthropathy. No stenosis.  T4-T5: Tiny shallow right paracentral disc protrusion. Mild left facet arthropathy. No stenosis.  T5-T6: Tiny shallow right paracentral disc protrusion.  No stenosis.  T6-T7: Small right paracentral disc protrusion. Right-sided disc osteophyte complex. Mild left facet arthropathy. No stenosis.  T7-T8: Small calcified right paracentral to far lateral disc protrusion extending into the neural foramen. Mild right facet arthropathy. Mild right neuroforaminal stenosis. No spinal canal or left neuroforaminal stenosis.  T8-T9: Prior posterior fusion. Moderate right paracentral to far lateral disc protrusion extending into the neural foramen. Flattening of the right ventral cord. Mild right neuroforaminal stenosis. No spinal canal or left neuroforaminal stenosis.  T9-T10: Prior posterior fusion.  Minimal disc bulging.  No stenosis.  T10-T11: Prior posterior fusion.  No stenosis.  T11-T12: Tiny right central calcified disc protrusion.  No stenosis.  CT LUMBAR MYELOGRAM FINDINGS:  Segmentation: Transitional lumbosacral anatomy with sacralization of L5. Hypoplastic ribs at T12. Please note that this is a  different numbering system from prior MRI lumbar spine in June 2019.  Alignment: Dextroscoliosis, apex at L1. Right lateral translation of L1 on L2 by 8 mm. 2 mm retrolisthesis at L1-L2. 2 mm anterolisthesis at L4-L5.  Vertebrae: Prior thoracolumbar fusion to the L5 level with interbody grafts from L2-L3 through L4-L5. Partial interbody fusion at these levels. Posterior element fusion from L1-L2 through L4-L5. The L1 pedicle screw extends lateral to the vertebral body. No acute fracture or other focal pathologic process.  Conus medullaris and cauda equina: Conus extends to the T12-L1 level. Conus and cauda equina appear normal.  Paraspinal and other soft tissues: Unchanged postoperative seroma at the laminectomy site extending from T12-L1 through L4-L5. Aortoiliac atherosclerotic vascular disease.  Disc levels:  T12-L1:  Prior posterior fusion.  No stenosis.  L1-L2: Prior posterior decompression and fusion. 8 x 8 x 11 mm ossific density in the midline ventral epidural space posterior to the L1 vertebral body, possibly a calcified disc fragment. No stenosis.  L2-L3:  Prior PLIF.  No residual stenosis.  L3-L4: Prior posterior decompression and PLIF. No residual stenosis.  L4-L5:  Prior posterior decompression and PLIF. Unchanged mild residual right neuroforaminal stenosis due to endplate spurring. No spinal canal or left neuroforaminal stenosis.  L5-S1:  Transitional level.  Negative.  IMPRESSION: Cervical spine:  1. Multilevel cervical spondylosis as described above. Advanced left-sided neuroforaminal stenosis from C3-C4 through C6-C7 due to facet uncovertebral hypertrophy.  Thoracic spine:  1. Prior lower thoracic fusion beginning at T8 without interbody or posterior element fusion. Loosening of the bilateral T8 screws. The left T9 screw violates the superior endplate. 2. Multilevel thoracic spondylosis as described above. Right-sided disc protrusions at T7-T8 and  T8-T9 result in mild right neuroforaminal stenosis, as well as flattening of the right ventral cord at T8-T9.  Lumbar spine:  1. Transitional anatomy with sacralization of L5. Hypoplastic ribs at T12. Please note that this is a different numbering system from the prior studies. Correlation with radiographs is recommended prior to any operative intervention. 2. Prior T8-L5 fusion without significant residual stenosis. 3. 8 x 8 x 11 mm ossific density in the midline ventral epidural space posterior to the L1 vertebral body, possibly a calcified disc fragment, without mass effect or impingement.   Electronically Signed By: Obie Dredge M.D. On: 04/02/2019 16:11  DG Knee Complete 4 Views Right  Narrative CLINICAL DATA:  Hyperextension injury of right knee last night. Pain with weight-bearing and swelling today. Dog ran into right knee and patient fell backward.  EXAM: RIGHT KNEE - COMPLETE 4+ VIEW  COMPARISON:  None Available.  FINDINGS: Mild medial coronoid joint space narrowing with mild-to-moderate peripheral degenerative osteophytosis. Mild-to-moderate patellofemoral joint space narrowing and peripheral osteophytosis. Small joint effusion. No acute fracture or dislocation.  IMPRESSION: 1. Small joint effusion. 2. No acute fracture. 3. 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   Electronically Signed By: Neita Garnet M.D. On: 12/30/2021 17:01   Narrative CLINICAL DATA:  [place] 3 weeks ago injured 2nd digit; swelling anterior of foot around metatarsal region x 3 days; caught 5th toe on edge of suitcase and pain and discoloration  EXAM: RIGHT FOOT COMPLETE - 3+ VIEW  COMPARISON:  11/16/2015  FINDINGS: There is no evidence of fracture or dislocation. There is no evidence of arthropathy or other focal bone abnormality. Soft tissues are unremarkable.  IMPRESSION: Negative.   Electronically Signed By:  Norva Pavlov M.D. On: 02/12/2017 14:22   Narrative See progress note    Narrative Clinical Data: Status post fall  RIGHT WRIST - COMPLETE 3+ VIEW  Comparison: None  Findings: Transverse distal radial fracture.  Associated ulnar styloid fracture.  The alignment remains anatomic.  IMPRESSION: Nondisplaced transverse radial metaphyseal fracture with associated ulnar styloid fracture.  Provider: Ulis Rias, Lossie Faes  Narrative CLINICAL DATA:  72 year old who fell from a standing position and injured the LEFT wrist. Initial encounter.  EXAM: LEFT WRIST - COMPLETE 3+ VIEW  COMPARISON:  None.  FINDINGS: Acute comminuted intra-articular fracture involving the distal radius with dorsal angulation and mild impaction. No other fractures. Osseous demineralization. Moderate narrowing of the trapezium-first metacarpal joint space.  IMPRESSION: 1. Acute comminuted intra-articular fracture involving the distal radius with dorsal angulation and mild impaction. 2. Osseous demineralization.   Electronically Signed By: Hulan Saas M.D. On: 08/08/2019 17:20   Hand Imaging: Hand-R DG Complete: Results for orders placed during the hospital encounter of 10/14/07  DG Hand Complete Right  Narrative Clinical Data: Status post fall  RIGHT HAND - COMPLETE 3+ VIEW  Comparison: None  Findings: Diffuse degenerative changes within the hand identified.  No fractures appreciated.  Distal radial metaphyseal and ulnar styloid fracture noted.  The fracture extends to involve the radial styloid.  No appreciable displacement.  IMPRESSION: Diffuse degenerative changes within the hand.  No acute fracture involving the hand.  Provider: Ulis Rias, Lossie Faes  Hand-L DG Complete: No results found for this or any previous visit.   Complexity Note: Imaging results reviewed.                         ROS  Cardiovascular: Daily Aspirin intake, High blood pressure,  and Blood thinners:  Antiplatelet Pulmonary or Respiratory: Wheezing and difficulty taking a deep full breath (Asthma) and Snoring  Neurological: Curved spine Psychological-Psychiatric: Depressed Gastrointestinal: Reflux or heatburn Genitourinary: No reported renal or genitourinary signs or symptoms such as difficulty voiding or producing urine, peeing blood, non-functioning kidney, kidney stones, difficulty emptying the bladder, difficulty controlling the flow of urine, or chronic kidney disease Hematological: Brusing easily and Bleeding easily Endocrine: No reported endocrine signs or symptoms such as high or low blood sugar, rapid heart rate due to high thyroid levels, obesity or weight gain due to slow thyroid or thyroid disease Rheumatologic: Joint aches and or swelling due to excess weight (Osteoarthritis), Rheumatoid arthritis, and Generalized muscle aches (Fibromyalgia) Musculoskeletal: Negative for myasthenia gravis, muscular dystrophy, multiple sclerosis or malignant hyperthermia Work History: Retired  Allergies  Vanessa Harmon is allergic to advair hfa [fluticasone-salmeterol], statins, breo ellipta [fluticasone furoate-vilanterol], fosamax [alendronate sodium], levaquin [levofloxacin], doxycycline hyclate, infliximab, spiriva handihaler [tiotropium bromide monohydrate], verapamil, and tape.  Laboratory Chemistry Profile   Renal Lab Results  Component Value Date   BUN 25 (H) 06/15/2022   CREATININE 1.18 06/15/2022   BCR SEE NOTE: 03/16/2022   GFR 45.95 (L) 06/15/2022   GFRAA >60 11/27/2019   GFRNONAA 63 04/05/2021   SPECGRAV 1.010 04/20/2017   PHUR 6.0 04/20/2017   PROTEINUR NEG 04/20/2017     Electrolytes Lab Results  Component Value Date   NA 138 06/15/2022   K 4.5 06/15/2022   CL 99 06/15/2022   CALCIUM 9.9 06/15/2022     Hepatic Lab Results  Component Value Date   AST 21 06/15/2022   ALT 18 06/15/2022   ALBUMIN 4.1 06/15/2022   ALKPHOS 40 06/15/2022      ID Lab Results  Component Value Date   LYMEIGGIGMAB <0.91 02/06/2019   SARSCOV2NAA NEGATIVE 05/27/2020   STAPHAUREUS NEGATIVE 05/27/2020   MRSAPCR NEGATIVE 05/27/2020     Bone Lab Results  Component Value Date   VD25OH 64.50 06/15/2022     Endocrine Lab Results  Component Value Date   GLUCOSE 131 (H) 06/15/2022   GLUCOSEU NEGATIVE 06/15/2022   HGBA1C 5.9 10/25/2021   TSH 0.77 09/04/2020   FREET4 0.85 09/04/2020     Neuropathy Lab Results  Component Value Date   VITAMINB12 333 06/15/2022   FOLATE 18.5 02/06/2019   HGBA1C 5.9 10/25/2021     CNS No results found for: "COLORCSF", "APPEARCSF", "RBCCOUNTCSF", "WBCCSF", "POLYSCSF", "LYMPHSCSF", "EOSCSF", "PROTEINCSF", "GLUCCSF", "JCVIRUS", "CSFOLI", "IGGCSF", "LABACHR", "ACETBL"   Inflammation (CRP: Acute  ESR: Chronic) Lab Results  Component Value Date   CRP 0.3 03/16/2022   ESRSEDRATE 2 12/14/2021     Rheumatology Lab Results  Component Value Date   RF <14 09/10/2021   LYMEIGGIGMAB <0.91 02/06/2019     Coagulation Lab Results  Component Value Date   INR 0.96 04/02/2014   LABPROT 12.9 04/02/2014   APTT 32 04/02/2014   PLT  397.0 06/15/2022     Cardiovascular Lab Results  Component Value Date   HGB 12.2 06/15/2022   HCT 36.6 06/15/2022     Screening Lab Results  Component Value Date   SARSCOV2NAA NEGATIVE 05/27/2020   STAPHAUREUS NEGATIVE 05/27/2020   MRSAPCR NEGATIVE 05/27/2020     Cancer No results found for: "CEA", "CA125", "LABCA2"   Allergens No results found for: "ALMOND", "APPLE", "ASPARAGUS", "AVOCADO", "BANANA", "BARLEY", "BASIL", "BAYLEAF", "GREENBEAN", "LIMABEAN", "WHITEBEAN", "BEEFIGE", "REDBEET", "BLUEBERRY", "BROCCOLI", "CABBAGE", "MELON", "CARROT", "CASEIN", "CASHEWNUT", "CAULIFLOWER", "CELERY"     Note: Lab results reviewed.  PFSH  Drug: Vanessa Harmon  reports no history of drug use. Alcohol:  reports current alcohol use. Tobacco:  reports that she quit smoking about 26 years  ago. Her smoking use included cigarettes. She has a 20.00 pack-year smoking history. She has never been exposed to tobacco smoke. She has never used smokeless tobacco. Medical:  has a past medical history of Adenomatous polyp, Allergy, Anxiety, Arthritis, Asthma, Cataract, Chronic back pain, Constipation, COVID (05/19/2022), Depression, Eczema, Essential hypertension, benign, Family history of adverse reaction to anesthesia, Fibromyalgia, GERD (gastroesophageal reflux disease), History of colon polyps, History of shingles, Hyperlipidemia, Insomnia, Joint pain, Osteoarthritis, Osteoporosis, PSVT (paroxysmal supraventricular tachycardia), Recurrent upper respiratory infection (URI), Rheumatoid arthritis (HCC), Rhinitis, allergic, Urinary urgency, and Urticaria. Family: family history includes AAA (abdominal aortic aneurysm) in her father and another family member; Allergic rhinitis in her father and mother; Atrial fibrillation in her brother and mother; COPD in her father; Cancer in an other family member; Hypertension in an other family member; Prostate cancer in her father; Stroke in her mother.  Past Surgical History:  Procedure Laterality Date   ADENOIDECTOMY     APPENDECTOMY     BACK SURGERY  2012   at age 14 d/t scoliosis   BLEPHAROPLASTY  2018   BREAST BIOPSY     cataract surgery     COLONOSCOPY     HARDWARE REMOVAL Left 02/24/2020   Procedure: HARDWARE REMOVAL LEFT DISTAL RADIUS;  Surgeon: Betha Loa, MD;  Location: [facility];  Service: Orthopedics;  Laterality: Left;   KNEE ARTHROSCOPY Bilateral    neuroma removed from foot     unsure of which foot   OPEN REDUCTION INTERNAL FIXATION (ORIF) DISTAL RADIAL FRACTURE Left 08/15/2019   Procedure: OPEN REDUCTION INTERNAL FIXATION (ORIF) DISTAL RADIAL FRACTURE;  Surgeon: Betha Loa, MD;  Location: [facility];  Service: Orthopedics;  Laterality: Left;  block in preop   POSTERIOR CERVICAL FUSION/FORAMINOTOMY N/A  05/29/2020   Procedure: Removal of hardware, Replacement of thoracic 8, thoracic 9, thoracic 10 screws, Right Thoracic 6-7, Right Thoracic 7-8 Laminectomies with thoracic hooks from Thoracic 4-5 to Thoracic 10;  Surgeon: Maeola Harman, MD;  Location: [facility] OR;  Service: Neurosurgery;  Laterality: N/A;   POSTERIOR LUMBAR FUSION 4 LEVEL N/A 02/15/2018   Procedure: Decompression and fusion Thoracic nine to Lumbar two with exploration of previous fusion;  Surgeon: Maeola Harman, MD;  Location: [facility] OR;  Service: Neurosurgery;  Laterality: N/A;   SHOULDER SURGERY Right    TONSILLECTOMY     TOTAL HIP ARTHROPLASTY Right 04/15/2014   Procedure: RIGHT TOTAL HIP ARTHROPLASTY ANTERIOR APPROACH;  Surgeon: Sheral Apley, MD;  Location: [facility] OR;  Service: Orthopedics;  Laterality: Right;   TOTAL KNEE ARTHROPLASTY Left 11/26/2019   Procedure: TOTAL KNEE ARTHROPLASTY;  Surgeon: Durene Romans, MD;  Location: [facility]S;  Service: Orthopedics;  Laterality: Left;  70 mins   Active Ambulatory Problems  Diagnosis Date Noted   Hyperlipidemia 10/16/2010   Depression 10/16/2010   Lumbar spondylosis 10/16/2010   Gastroesophageal reflux disease 10/16/2010   Adenomatous polyp of colon 10/16/2010   History of paroxysmal supraventricular tachycardia 10/16/2010   Essential (primary) hypertension 10/16/2010   Lumbar back pain 12/19/2011   Lumbar spine scoliosis 11/28/2013   DJD (degenerative joint disease) 04/15/2014   History of polymyalgia rheumatica 02/15/2015   Idiopathic scoliosis of thoracolumbar region 02/15/2018   Left knee OA 11/26/2019   Body mass index (BMI) 28.0-28.9, adult 11/27/2019   Posterior vitreous detachment of right eye 01/21/2020   Pseudophakia 01/21/2020   Horseshoe retinal tear of right eye 01/21/2020   Thoracic spondylosis with cord compression 05/29/2020   Degenerative spondylolisthesis 10/09/2013   Heterotopic ossification 03/30/2020   Loosening of hardware in spine (HCC) 01/27/2020    Lumbosacral radiculitis 07/31/2013   Moderate persistent asthma without complication 05/04/2017   Peripheral polyneuropathy 01/14/2019   Seronegative rheumatoid arthritis (HCC) 01/25/2019   Spinal stenosis, thoracolumbar region 01/14/2019   Thoracogenic scoliosis, thoracolumbar region 01/14/2019   Lumbar radiculopathy 09/16/2013   Scoliosis, or kyphoscoliosis, idiopathic 07/31/2013   Rhinitis, chronic 05/16/2017   Numbness of foot 02/07/2019   Posterior vitreous detachment of left eye 09/29/2020   High risk medication use 09/10/2021   Medial epicondylitis of left elbow 03/16/2022   Age-related osteoporosis without current pathological fracture 06/16/2022   Resolved Ambulatory Problems    Diagnosis Date Noted   Vitreous hemorrhage of right eye (HCC) 01/21/2020   Recurrent major depressive disorder, in full remission (HCC) 10/16/2010   Past Medical History:  Diagnosis Date   Adenomatous polyp    Allergy    Anxiety    Arthritis    Asthma    Cataract    Chronic back pain    Constipation    COVID 05/19/2022   Eczema    Essential hypertension, benign    Family history of adverse reaction to anesthesia    Fibromyalgia    GERD (gastroesophageal reflux disease)    History of colon polyps    History of shingles    Insomnia    Joint pain    Osteoarthritis    Osteoporosis    PSVT (paroxysmal supraventricular tachycardia)    Recurrent upper respiratory infection (URI)    Rheumatoid arthritis (HCC)    Rhinitis, allergic    Urinary urgency    Urticaria    Constitutional Exam  General appearance: Well nourished, well developed, and well hydrated. In no apparent acute distress Vitals:   08/22/22 1428  BP: 139/73  Pulse: 76  Resp: 16  Temp: (!) 97.3 F (36.3 C)  SpO2: 100%  Weight: 186 lb (84.4 kg)  Height: 5' 6.5" (1.689 m)   BMI Assessment: Estimated body mass index is 29.57 kg/m as calculated from the following:   Height as of this encounter: 5' 6.5" (1.689 m).    Weight as of this encounter: 186 lb (84.4 kg).  BMI interpretation table: BMI level Category Range association with higher incidence of chronic pain  <18 kg/m2 Underweight   18.5-24.9 kg/m2 Ideal body weight   25-29.9 kg/m2 Overweight Increased incidence by 20%  30-34.9 kg/m2 Obese (Class I) Increased incidence by 68%  35-39.9 kg/m2 Severe obesity (Class II) Increased incidence by 136%  >40 kg/m2 Extreme obesity (Class III) Increased incidence by 254%   Patient's current BMI Ideal Body weight  Body mass index is 29.57 kg/m. Ideal body weight: 60.5 kg (133 lb 4.3 oz) Adjusted ideal body weight:  70 kg (154 lb 5.8 oz)   BMI Readings from Last 4 Encounters:  08/22/22 29.57 kg/m  07/13/22 30.02 kg/m  06/16/22 30.34 kg/m  06/15/22 29.77 kg/m   Wt Readings from Last 4 Encounters:  08/22/22 186 lb (84.4 kg)  07/13/22 186 lb (84.4 kg)  06/16/22 188 lb (85.3 kg)  06/15/22 187 lb 4 oz (84.9 kg)    Psych/Mental status: Alert, oriented x 3 (person, place, & time)       Eyes: PERLA Respiratory: No evidence of acute respiratory distress  Thoracic Spine Area Exam  Skin & Axial Inspection: Well healed scar from previous spine surgery detected Alignment: Symmetrical Functional ROM: Mechanically restricted ROM Stability: No instability detected Muscle Tone/Strength: Functionally intact. No obvious neuro-muscular anomalies detected. Sensory (Neurological): Neurogenic pain pattern Muscle strength & Tone: No palpable anomalies Lumbar Spine Area Exam  Skin & Axial Inspection: Well healed scar from previous spine surgery detected Alignment: Scoliosis detected Functional ROM: Mechanically restricted ROM       Stability: No instability detected Muscle Tone/Strength: Functionally intact. No obvious neuro-muscular anomalies detected. Sensory (Neurological): Neurogenic pain pattern  Ambulation: Unassisted Gait: Antalgic gait (limping) Posture: Difficulty standing up straight, due to pain   Lower Extremity Exam    Side: Right lower extremity  Side: Left lower extremity  Stability: No instability observed          Stability: No instability observed          Skin & Extremity Inspection: Skin color, temperature, and hair growth are WNL. No peripheral edema or cyanosis. No masses, redness, swelling, asymmetry, or associated skin lesions. No contractures.  Skin & Extremity Inspection: Skin color, temperature, and hair growth are WNL. No peripheral edema or cyanosis. No masses, redness, swelling, asymmetry, or associated skin lesions. No contractures.  Functional ROM: Pain restricted ROM for hip and knee joints          Functional ROM: Pain restricted ROM for hip and knee joints          Muscle Tone/Strength: Functionally intact. No obvious neuro-muscular anomalies detected.  Muscle Tone/Strength: Functionally intact. No obvious neuro-muscular anomalies detected.  Sensory (Neurological): Unimpaired        Sensory (Neurological): Unimpaired        DTR: Patellar: deferred today Achilles: deferred today Plantar: deferred today  DTR: Patellar: deferred today Achilles: deferred today Plantar: deferred today  Palpation: No palpable anomalies  Palpation: No palpable anomalies    Assessment  Primary Diagnosis & Pertinent Problem List: The primary encounter diagnosis was Chronic radicular lumbar pain. Diagnoses of Lumbar facet arthropathy, Lumbar radiculopathy, Spinal stenosis, lumbar region, with neurogenic claudication, Lumbar degenerative disc disease, History of lumbar fusion, and Chronic pain syndrome were also pertinent to this visit.  Visit Diagnosis (New problems to examiner): 1. Chronic radicular lumbar pain   2. Lumbar facet arthropathy   3. Lumbar radiculopathy   4. Spinal stenosis, lumbar region, with neurogenic claudication   5. Lumbar degenerative disc disease   6. History of lumbar fusion   7. Chronic pain syndrome    Plan of Care (Initial workup plan)  Complex case  with significant spinal fusion from T4-L5.  Limited interventional options.  I did discuss a caudal epidural steroid injection with her as well as diagnostic SI joint injection.  She states that she will think about this further and review with her primary pain provider, Dr. Ethelene Hal.  I also recommend that she consider acupuncture.  Continue with medication management with [facility].  Follow-up as  needed.   Provider-requested follow-up: Return for patient will call to schedule F2F appt prn.  Future Appointments  Date Time Provider Department Center  09/21/2022  1:00 PM Fuller Plan, MD [facility] None  10/06/2022  1:15 PM Rankin, Alford Highland, MD [facility] None  10/26/2022  1:00 PM Jarold Motto, PA [facility]    Duration of encounter: .  Total time on encounter, as per AMA guidelines included both the face-to-face and non-face-to-face time personally spent by the physician and/or other qualified health care professional(s) on the day of the encounter (includes time in activities that require the physician or other qualified health care professional and does not include time in activities normally performed by clinical staff). Physician's time may include the following activities when performed: Preparing to see the patient (e.g., pre-charting review of records, searching for previously ordered imaging, lab work, and nerve conduction tests) Review of prior analgesic pharmacotherapies. Reviewing PMP Interpreting ordered tests (e.g., lab work, imaging, nerve conduction tests) Performing post-procedure evaluations, including interpretation of diagnostic procedures Obtaining and/or reviewing separately obtained history Performing a medically appropriate examination and/or evaluation Counseling and educating the patient/family/caregiver Ordering medications, tests, or procedures Referring and communicating with other health care professionals (when not separately reported) Documenting  clinical information in the electronic or other health record Independently interpreting results (not separately reported) and communicating results to the patient/ family/caregiver Care coordination (not separately reported)  Note by: Edward Jolly, MD (TTS technology used. I apologize for any typographical errors that were not detected and corrected.) Date: 08/22/2022; Time: 3:50 PM

## 2022-08-24 ENCOUNTER — Other Ambulatory Visit: Payer: Self-pay | Admitting: Internal Medicine

## 2022-08-24 NOTE — Telephone Encounter (Signed)
Last Fill: 04/29/2022  Next Visit: 09/21/2022  Last Visit: 07/13/2022  Dx: Seronegative rheumatoid arthritis   Current Dose per office note on 07/13/2022: prednisone 5 mg daily   Okay to refill Prednisone?

## 2022-08-31 ENCOUNTER — Other Ambulatory Visit: Payer: Self-pay | Admitting: Physician Assistant

## 2022-09-07 DIAGNOSIS — Z79899 Other long term (current) drug therapy: Secondary | ICD-10-CM | POA: Diagnosis not present

## 2022-09-07 DIAGNOSIS — Z96641 Presence of right artificial hip joint: Secondary | ICD-10-CM | POA: Diagnosis not present

## 2022-09-07 DIAGNOSIS — Z5181 Encounter for therapeutic drug level monitoring: Secondary | ICD-10-CM | POA: Diagnosis not present

## 2022-09-07 DIAGNOSIS — M5412 Radiculopathy, cervical region: Secondary | ICD-10-CM | POA: Diagnosis not present

## 2022-09-07 DIAGNOSIS — M47896 Other spondylosis, lumbar region: Secondary | ICD-10-CM | POA: Diagnosis not present

## 2022-09-07 DIAGNOSIS — M961 Postlaminectomy syndrome, not elsewhere classified: Secondary | ICD-10-CM | POA: Diagnosis not present

## 2022-09-14 DIAGNOSIS — M791 Myalgia, unspecified site: Secondary | ICD-10-CM | POA: Diagnosis not present

## 2022-09-14 DIAGNOSIS — J45901 Unspecified asthma with (acute) exacerbation: Secondary | ICD-10-CM | POA: Diagnosis not present

## 2022-09-14 DIAGNOSIS — R0981 Nasal congestion: Secondary | ICD-10-CM | POA: Diagnosis not present

## 2022-09-14 DIAGNOSIS — R051 Acute cough: Secondary | ICD-10-CM | POA: Diagnosis not present

## 2022-09-20 ENCOUNTER — Other Ambulatory Visit: Payer: Self-pay | Admitting: Physician Assistant

## 2022-09-20 ENCOUNTER — Other Ambulatory Visit: Payer: Self-pay | Admitting: Internal Medicine

## 2022-09-20 NOTE — Telephone Encounter (Signed)
Last Fill: 08/24/2022  Next Visit: 09/21/2022  Last Visit: 07/13/2022  Dx:  Seronegative rheumatoid arthritis   Current Dose per office note on 07/13/2022: prednisone 5 mg daily   Okay to refill Prednisone?

## 2022-09-21 ENCOUNTER — Ambulatory Visit: Payer: Medicare Other | Attending: Internal Medicine | Admitting: Internal Medicine

## 2022-09-21 ENCOUNTER — Encounter: Payer: Self-pay | Admitting: Internal Medicine

## 2022-09-21 VITALS — BP 132/79 | HR 111 | Resp 16 | Ht 66.5 in | Wt 187.0 lb

## 2022-09-21 DIAGNOSIS — M0600 Rheumatoid arthritis without rheumatoid factor, unspecified site: Secondary | ICD-10-CM

## 2022-09-21 DIAGNOSIS — Z79899 Other long term (current) drug therapy: Secondary | ICD-10-CM

## 2022-09-21 DIAGNOSIS — M810 Age-related osteoporosis without current pathological fracture: Secondary | ICD-10-CM | POA: Diagnosis not present

## 2022-09-21 DIAGNOSIS — M4310 Spondylolisthesis, site unspecified: Secondary | ICD-10-CM

## 2022-09-21 LAB — CBC WITH DIFFERENTIAL/PLATELET
Eosinophils Absolute: 145 cells/uL (ref 15–500)
MCV: 96.9 fL (ref 80.0–100.0)
Monocytes Relative: 7.9 %
WBC: 14.5 10*3/uL — ABNORMAL HIGH (ref 3.8–10.8)

## 2022-09-21 NOTE — Progress Notes (Signed)
Office Visit Note  Patient: Vanessa Harmon             Date of Birth: [birth year]           MRN: [id number]             PCP: Jarold Motto, PA Referring: Jarold Motto, PA Visit Date: 09/21/2022   Subjective:  Follow-up (Patient states she has been sick for over a week and is now on Amoxicillin. )   History of Present Illness: Vanessa Harmon is a 72 y.o. female here for follow up here for follow-up of seronegative RA on Rinvoq 15 mg p.o. daily and prednisone 5 mg daily.  The left elbow pain discussed at her last visit improved after steroid injection and has continued to do well.  Joint pain and inflammation doing pretty well overall chronic back pain is her biggest issue.  Continues having easy bruising and skin tears frequently from her 90 pound Rottweiler.  Recently sick with sinus infection for the past week is currently on amoxicillin for this.  Not associated with any fevers lymphadenopathy or other constitutional symptoms.  Previous HPI 07/13/22 Vanessa Harmon is a 72 y.o. female here for follow up for seronegative RA on rinvoq 15 mg PO daily and prednisone 5 mg daily.  Since her last visit she finished getting over the persistent sinus infection symptoms after recent COVID illness.  However once the symptoms cleared up she started noticing increase in hip bursitis pain.  Also started getting worsening pain along the medial side of the left elbow.  Most noticeable when using her hands to help her self push off from a seated position due to her hip pain.  Not noticed any visible swelling or erythema she still has bruising around the elbow but that is more chronic and there are multiple skin tears in the area as well.  Does not feel like she is having flareup of increased joint symptoms outside of the hip and elbow pains.  She previously tried use of the elbow brace which we discussed before for her medial epicondylitis but just gets irritation around the skin and elbow does not  feel much benefit.   Previous HPI 06/16/22 Vanessa Harmon is a 72 y.o. female here for follow up for seronegative RA on prednisone 5 mg daily and rinvoq 15 mg daily started since 2/18. Also prolia for osteoporosis last injection on 1/24 without complication. Main issue has been sickness with COVID positive test 2/8 and persistent symptoms. She had subsequent sinus infection treated with augmentin starting 2/21 and had repeat labs checked yesterday. Brain fog, fatigue worse during COVID infection and partially improved since. She thinks rinvoq is starting to help with swelling and pain in hands and wrists but still significant overall symptoms. Has some skin tears and bruising on arms from her dog.     Previous HPI 03/16/22 Vanessa Harmon is a 72 y.o. female here for follow up for seropositive RA on prednisone 5 mg daily. Methotrexate was discontinued due to worsening laboratory function tests and Actemra stopped due to developing palpable rashes throughout her legs similar to previous medication reaction to infliximab. She is also noticed easier bruising than usual and has seen discoloration of her nails with some separation and cracking.  Prednisone is helping somewhat but still has ongoing joint pain and stiffness specially off of her previous medications.  In particular bothering her lately has been persistent pain along the medial side of her left elbow  does not recall any specific injury to this area but does feel like there is a sense of swelling and pressure.   Previous HPI 12/14/21 Vanessa Harmon is a 72 y.o. female here for follow up for seropositive RA on methotrexate 15 mg subcu weekly prednisone 5 mg daily leucovorin 1 mg daily and after starting Actemra 162 mg Zihlman q. 14 days.  She feels like arthritis symptoms have been well controlled on her current medications.  No significant flareups.  She still gets swelling in her wrist but no other visible joint swelling.  She has had pretty  extensive bruising and easy skin tearing on both arms often from playing with her dog.  Lab tests checked in July did show appears to be a small decrease in estimated GFR.   Previous HPI 09/10/2021 Vanessa Harmon is a 72 y.o. female here for evaluation of chronic back pain associated with seropositive RA she has been seeing Azucena Fallen on treatment with orencia IV, MTX 15 mg [place] weekly, leucovorin 1 mg daily, and prednisone 5 mg daily.  She was originally diagnosed with inflammatory arthritis by Dr. Dareen Piano about a decade ago.  Apparently the worst initial symptom at that time was left hip pain and inflammation with concerning findings on MRI.  Was started on methotrexate as a steroid sparing DMARD treatment.  Apparently had initial concern for seronegative rheumatoid arthritis versus PMR presentation.  She did not tolerate methotrexate well initially worst side effect was substantial hair loss.  Also did not tolerate well on leflunomide.  She was started on infliximab treatment developed a significant adverse reaction with new skin rashes in multiple areas and severe finger and toe nail changes that lasted about a year after discontinuing the medication.  She was started on IV Orencia monthly infusions which has been beneficial for symptoms but still notices some amount ongoing and that symptoms tend to worsen between each dosing interval.  She was restarted on methotrexate this time with leucovorin in the past few months and this has improved her joint inflammation further.  She remains on low-dose prednisone 5 mg daily throughout most of this time.  Most recent definite flareup of her rheumatoid arthritis was about 6 months ago. In addition to inflammatory joint disease she has significant amount of generalized osteoarthritis.  She reports bone-on-bone degenerative changes with slightly impaired range of motion in the left shoulder.  She had previous left knee total arthroplasty.  She has degenerative  arthritis at multiple levels in the spine with history of repeat lumbar spine surgery.  She has some spinal and foraminal stenosis with radicular symptoms.  She is also had recurrent problems with hip bursitis has been responsive to local steroid injection treatments with Dr. Charlann Boxer.  She takes gabapentin 300 mg at night and as needed Robaxin 500 mg for the ongoing symptoms. She has not had any history with recurrent infections or major side effects on her current regimen.  She does have extensive bruising mostly worst in the past year and worst on extensor surfaces of arms and legs.  She denies any history of abnormal bleeding and blood clots. She has chronic dry eyes and sees Dr. Dione Booze currently uses xiidra and genteal drops for this.   Review of Systems  Constitutional:  Positive for fatigue.  HENT:  Positive for mouth dryness. Negative for mouth sores.   Eyes:  Positive for dryness.  Respiratory:  Negative for shortness of breath.   Cardiovascular:  Negative for chest pain and  palpitations.  Gastrointestinal:  Positive for constipation. Negative for blood in stool and diarrhea.  Endocrine: Negative for increased urination.  Genitourinary:  Negative for involuntary urination.  Musculoskeletal:  Positive for joint pain, gait problem, joint pain, muscle weakness and morning stiffness. Negative for joint swelling, myalgias, muscle tenderness and myalgias.  Skin:  Positive for color change and hair loss. Negative for rash and sensitivity to sunlight.  Allergic/Immunologic: Positive for susceptible to infections.  Neurological:  Negative for dizziness and headaches.  Hematological:  Negative for swollen glands.  Psychiatric/Behavioral:  Positive for depressed mood and sleep disturbance. The patient is nervous/anxious.     PMFS History:  Patient Active Problem List   Diagnosis Date Noted   Age-related osteoporosis without current pathological fracture 06/16/2022   Medial epicondylitis of left  elbow 03/16/2022   High risk medication use 09/10/2021   Posterior vitreous detachment of left eye 09/29/2020   Thoracic spondylosis with cord compression 05/29/2020   Heterotopic ossification 03/30/2020   Loosening of hardware in spine (HCC) 01/27/2020   Posterior vitreous detachment of right eye 01/21/2020   Pseudophakia 01/21/2020   Horseshoe retinal tear of right eye 01/21/2020   Body mass index (BMI) 28.0-28.9, adult 11/27/2019   Left knee OA 11/26/2019   Numbness of foot 02/07/2019   Seronegative rheumatoid arthritis (HCC) 01/25/2019   Peripheral polyneuropathy 01/14/2019   Spinal stenosis, thoracolumbar region 01/14/2019   Thoracogenic scoliosis, thoracolumbar region 01/14/2019   Idiopathic scoliosis of thoracolumbar region 02/15/2018   Rhinitis, chronic 05/16/2017   Moderate persistent asthma without complication 05/04/2017   History of polymyalgia rheumatica 02/15/2015   DJD (degenerative joint disease) 04/15/2014   Lumbar spine scoliosis 11/28/2013   Degenerative spondylolisthesis 10/09/2013   Lumbar radiculopathy 09/16/2013   Lumbosacral radiculitis 07/31/2013   Scoliosis, or kyphoscoliosis, idiopathic 07/31/2013   Lumbar back pain 12/19/2011   Hyperlipidemia 10/16/2010   Depression 10/16/2010   Lumbar spondylosis 10/16/2010   Gastroesophageal reflux disease 10/16/2010   Adenomatous polyp of colon 10/16/2010   History of paroxysmal supraventricular tachycardia 10/16/2010   Essential (primary) hypertension 10/16/2010    Past Medical History:  Diagnosis Date   Adenomatous polyp    Allergy    Anxiety    takes Ativan daily as needed   Arthritis    inflammatory arthritis (Dr. Verdie Shire)   Asthma    Cataract    Chronic back pain    scoliosis    Constipation    takes miralax every other day   COVID 05/19/2022   Depression    takes Lexapro daily   Eczema    Essential hypertension, benign    takes Hyzaar and Metoprolol daily   Family history of adverse  reaction to anesthesia    Sister PONV   Fibromyalgia    GERD (gastroesophageal reflux disease)    History of colon polyps    History of shingles    Hyperlipidemia    takes Atorvastatin daily   Insomnia    takes Melatonin nightly   Joint pain    Osteoarthritis    Osteoporosis    PSVT (paroxysmal supraventricular tachycardia)    Recurrent upper respiratory infection (URI)    Rheumatoid arthritis (HCC)    sees Dr. Dierdre Forth; stable on Orencia   Rhinitis, allergic    uses Flonase daily   Urinary urgency    Urticaria     Family History  Problem Relation Age of Onset   Atrial fibrillation Mother    Stroke Mother    Allergic rhinitis Mother  Allergic rhinitis Father    AAA (abdominal aortic aneurysm) Father    COPD Father    Prostate cancer Father    Atrial fibrillation Brother    Hypertension Other        unspecified grandmother   AAA (abdominal aortic aneurysm) Other        unspecified grandfather   Cancer Other        unspecified grandfather   Neuropathy Neg Hx    Past Surgical History:  Procedure Laterality Date   ADENOIDECTOMY     APPENDECTOMY     BACK SURGERY  2012   at age 34 d/t scoliosis   BLEPHAROPLASTY  2018   BREAST BIOPSY     cataract surgery     COLONOSCOPY     HARDWARE REMOVAL Left 02/24/2020   Procedure: HARDWARE REMOVAL LEFT DISTAL RADIUS;  Surgeon: Betha Loa, MD;  Location: [facility];  Service: Orthopedics;  Laterality: Left;   KNEE ARTHROSCOPY Bilateral    neuroma removed from foot     unsure of which foot   OPEN REDUCTION INTERNAL FIXATION (ORIF) DISTAL RADIAL FRACTURE Left 08/15/2019   Procedure: OPEN REDUCTION INTERNAL FIXATION (ORIF) DISTAL RADIAL FRACTURE;  Surgeon: Betha Loa, MD;  Location: [facility];  Service: Orthopedics;  Laterality: Left;  block in preop   POSTERIOR CERVICAL FUSION/FORAMINOTOMY N/A 05/29/2020   Procedure: Removal of hardware, Replacement of thoracic 8, thoracic 9, thoracic 10 screws,  Right Thoracic 6-7, Right Thoracic 7-8 Laminectomies with thoracic hooks from Thoracic 4-5 to Thoracic 10;  Surgeon: Maeola Harman, MD;  Location: [facility] OR;  Service: Neurosurgery;  Laterality: N/A;   POSTERIOR LUMBAR FUSION 4 LEVEL N/A 02/15/2018   Procedure: Decompression and fusion Thoracic nine to Lumbar two with exploration of previous fusion;  Surgeon: Maeola Harman, MD;  Location: [facility] OR;  Service: Neurosurgery;  Laterality: N/A;   SHOULDER SURGERY Right    TONSILLECTOMY     TOTAL HIP ARTHROPLASTY Right 04/15/2014   Procedure: RIGHT TOTAL HIP ARTHROPLASTY ANTERIOR APPROACH;  Surgeon: Sheral Apley, MD;  Location: [facility] OR;  Service: Orthopedics;  Laterality: Right;   TOTAL KNEE ARTHROPLASTY Left 11/26/2019   Procedure: TOTAL KNEE ARTHROPLASTY;  Surgeon: Durene Romans, MD;  Location: [facility];  Service: Orthopedics;  Laterality: Left;  70 mins   Social History   Social History Narrative   Retired Engineer, civil (consulting)   No children   Lives with husband   Immunization History  Administered Date(s) Administered   Influenza Split 01/31/2012   Influenza Whole 02/16/2010   Influenza, High Dose Seasonal PF 12/13/2018, 02/18/2022   Influenza,inj,Quad PF,6+ Mos 01/14/2014, 01/27/2015, 02/02/2016   Influenza,inj,quad, With Preservative 02/26/2020   Influenza-Unspecified 02/24/2021   Moderna Sars-Covid-2 Vaccination 02/20/2022   PFIZER(Purple Top)SARS-COV-2 Vaccination 05/25/2019, 06/15/2019, 02/05/2020, 08/03/2020   Pneumococcal Conjugate-13 01/27/2015   Pneumococcal Polysaccharide-23 05/26/2017   Tdap 10/04/2011   Zoster Recombinat (Shingrix) 10/13/2017, 01/02/2018     Objective: Vital Signs: BP 132/79 (BP Location: Left Arm, Patient Position: Sitting, Cuff Size: Normal)   Pulse (!) 111   Resp 16   Ht 5' 6.5" (1.689 m)   Wt 187 lb (84.8 kg)   BMI 29.73 kg/m    Physical Exam Eyes:     Conjunctiva/sclera: Conjunctivae normal.  Cardiovascular:     Rate and Rhythm: Regular rhythm. Tachycardia  present.  Pulmonary:     Effort: Pulmonary effort is normal.     Breath sounds: Normal breath sounds.  Musculoskeletal:     Right lower leg:  No edema.     Left lower leg: No edema.  Lymphadenopathy:     Cervical: No cervical adenopathy.  Skin:    General: Skin is warm and dry.     Findings: Bruising present.  Neurological:     Mental Status: She is alert.  Psychiatric:        Mood and Affect: Mood normal.      Musculoskeletal Exam:  Shoulders full ROM, stiffness with overhead abduction and with external rotation but not painful Elbows full ROM no tenderness or swelling Wrists full ROM no tenderness or swelling Fingers full ROM bony nodules throughout hands, some lateral deviations at DIP joints, no synovitis Back with scoliosis and rotation more leftward in thoracic spine, some tenderness midline and over paraspinal muscles Knees full ROM no tenderness or swelling   CDAI Exam: CDAI Score: 2  Patient Global: 10 / 100; Provider Global: 10 / 100 Swollen: 0 ; Tender: 0  Joint Exam 09/21/2022   All documented joints were normal     Investigation: No additional findings.  Imaging: No results found.  Recent Labs: Lab Results  Component Value Date   WBC 14.5 (H) 09/21/2022   HGB 12.6 09/21/2022   PLT 394 09/21/2022   NA 137 09/21/2022   K 5.4 (H) 09/21/2022   CL 96 (L) 09/21/2022   CO2 31 09/21/2022   GLUCOSE 124 (H) 09/21/2022   BUN 20 09/21/2022   CREATININE 1.12 (H) 09/21/2022   BILITOT 0.4 09/21/2022   ALKPHOS 40 06/15/2022   AST 21 09/21/2022   ALT 25 09/21/2022   PROT 6.4 09/21/2022   ALBUMIN 4.1 06/15/2022   CALCIUM 10.2 09/21/2022   GFRAA >60 11/27/2019   QFTBGOLDPLUS NEGATIVE 09/10/2021    Speciality Comments: No specialty comments available.  Procedures:  No procedures performed Allergies: Advair hfa [fluticasone-salmeterol], Statins, Breo ellipta [fluticasone furoate-vilanterol], Fosamax [alendronate sodium], Levaquin [levofloxacin],  Infliximab, Spiriva handihaler [tiotropium bromide monohydrate], Verapamil, and Tape   Assessment / Plan:     Visit Diagnoses: Seronegative rheumatoid arthritis (HCC) - Plan: Sedimentation rate  Joint inflammation appears reasonably well-controlled though still requiring low-dose prednisone.  Currently on an interruption of her rinvoq due to upper respiratory infection.  Checking sed rate for disease activity monitoring.  Plan to continue rinvoq 15 mg daily resume after she finishes the antibiotics and prednisone 5 mg daily.  High risk medication use - Plan: CBC with Differential/Platelet, COMPLETE METABOLIC PANEL WITH GFR  Checking CBC and CMP for medication monitoring on long-term use of rinvoq and low-dose prednisone.  Currently dealing with some upper respiratory infection.  Prior to this was not requiring any antibiotics earlier while on the medication.  Still having a lot of easy bruising and skin fragility discussed this can be exacerbated with the low-dose prednisone.  Degenerative spondylolisthesis Age-related osteoporosis without current pathological fracture  On Prolia last dose January 24 subsequent labs including metabolic panel and vitamin D were fine.  No new falls.  Next dose due in July  Orders: Orders Placed This Encounter  Procedures   Sedimentation rate   CBC with Differential/Platelet   COMPLETE METABOLIC PANEL WITH GFR   No orders of the defined types were placed in this encounter.    Follow-Up Instructions: Return in about 3 months (around 12/22/2022) for RA on UPA/GC f/u 3mos.   Fuller Plan, MD  Note - This record has been created using [employer].  Chart creation errors have been sought, but may not always  have been located.  Such creation errors do not reflect on  the standard of medical care.

## 2022-09-22 LAB — COMPLETE METABOLIC PANEL WITH GFR
AG Ratio: 2.2 (calc) (ref 1.0–2.5)
ALT: 25 U/L (ref 6–29)
AST: 21 U/L (ref 10–35)
Albumin: 4.4 g/dL (ref 3.6–5.1)
Alkaline phosphatase (APISO): 39 U/L (ref 37–153)
BUN/Creatinine Ratio: 18 (calc) (ref 6–22)
BUN: 20 mg/dL (ref 7–25)
CO2: 31 mmol/L (ref 20–32)
Calcium: 10.2 mg/dL (ref 8.6–10.4)
Chloride: 96 mmol/L — ABNORMAL LOW (ref 98–110)
Creat: 1.12 mg/dL — ABNORMAL HIGH (ref 0.60–1.00)
Globulin: 2 g/dL (calc) (ref 1.9–3.7)
Glucose, Bld: 124 mg/dL — ABNORMAL HIGH (ref 65–99)
Potassium: 5.4 mmol/L — ABNORMAL HIGH (ref 3.5–5.3)
Sodium: 137 mmol/L (ref 135–146)
Total Bilirubin: 0.4 mg/dL (ref 0.2–1.2)
Total Protein: 6.4 g/dL (ref 6.1–8.1)
eGFR: 52 mL/min/{1.73_m2} — ABNORMAL LOW (ref >=60)

## 2022-09-22 LAB — CBC WITH DIFFERENTIAL/PLATELET
Absolute Monocytes: 1146 cells/uL — ABNORMAL HIGH (ref 200–950)
Basophils Absolute: 73 cells/uL (ref 0–200)
Basophils Relative: 0.5 %
Eosinophils Relative: 1 %
HCT: 38 % (ref 35.0–45.0)
Hemoglobin: 12.6 g/dL (ref 11.7–15.5)
Lymphs Abs: 1842 cells/uL (ref 850–3900)
MCH: 32.1 pg (ref 27.0–33.0)
MCHC: 33.2 g/dL (ref 32.0–36.0)
MPV: 10.9 fL (ref 7.5–12.5)
Neutro Abs: 11296 cells/uL — ABNORMAL HIGH (ref 1500–7800)
Neutrophils Relative %: 77.9 %
Platelets: 394 10*3/uL (ref 140–400)
RBC: 3.92 10*6/uL (ref 3.80–5.10)
RDW: 13.5 % (ref 11.0–15.0)
Total Lymphocyte: 12.7 %

## 2022-09-22 LAB — SEDIMENTATION RATE: Sed Rate: 9 mm/h (ref 0–30)

## 2022-09-22 NOTE — Progress Notes (Signed)
Sed rate remains in normal range. White blood count is increased to 14.5 probably from the respiratory infection. Kidney function is slightly decrease but her potassium and chloride are also off so I suspect this is a mild dehydration effect. No problem for continuing rinvoq.

## 2022-09-26 ENCOUNTER — Other Ambulatory Visit: Payer: Self-pay | Admitting: Physician Assistant

## 2022-09-29 ENCOUNTER — Encounter: Payer: Self-pay | Admitting: Physician Assistant

## 2022-09-29 ENCOUNTER — Ambulatory Visit (INDEPENDENT_AMBULATORY_CARE_PROVIDER_SITE_OTHER): Payer: Medicare Other | Admitting: Physician Assistant

## 2022-09-29 VITALS — BP 138/84 | HR 91 | Temp 97.8°F | Ht 66.5 in | Wt 188.7 lb

## 2022-09-29 DIAGNOSIS — R051 Acute cough: Secondary | ICD-10-CM

## 2022-09-29 MED ORDER — AMOXICILLIN-POT CLAVULANATE 875-125 MG PO TABS: 1.0000 | ORAL_TABLET | Freq: Two times a day (BID) | ORAL | 0 refills | Status: DC

## 2022-09-29 MED ORDER — PREDNISONE 20 MG PO TABS: 20.0000 mg | ORAL_TABLET | Freq: Every day | ORAL | 0 refills | Status: DC

## 2022-09-29 NOTE — Progress Notes (Signed)
Vanessa Harmon is a 72 y.o. female here for a follow up of a pre-existing problem.  History of Present Illness:   Chief Complaint  Patient presents with   URI    Pt stated that she was seen in the UC 2 1/2 weeks ago and Dx with URI. I has improved but its still hard to catch her breath    HPI  URI: She reports persistent wheezing, productive cough, shortness of breath, and congestion for about 3 weeks. She was in the UC 2.5 weeks ago and was diagnosed with an URI. Previously had a sore throat that has since improved. She reports she was prescribed Azelastine nasal spray, a cough syrup, 3 g Amoxicillin daily (1000 mg, 3x a day), and a steroid boost of 60 mg for 5 days. She states she only took Amoxicillin for 1.5 days. She took the steroid as prescribed. She is using OTC medications such as Benadryl 3x daily, Sudafed, and Zyrtec. Reports negative Covid test result.  Denies ear pain, diarrhea, and chest pain.    Past Medical History:  Diagnosis Date   Adenomatous polyp    Allergy    Anxiety    takes Ativan daily as needed   Arthritis    inflammatory arthritis (Dr. Verdie Shire)   Asthma    Cataract    Chronic back pain    scoliosis    Constipation    takes miralax every other day   COVID 05/19/2022   Depression    takes Lexapro daily   Eczema    Essential hypertension, benign    takes Hyzaar and Metoprolol daily   Family history of adverse reaction to anesthesia    Sister PONV   Fibromyalgia    GERD (gastroesophageal reflux disease)    History of colon polyps    History of shingles    Hyperlipidemia    takes Atorvastatin daily   Insomnia    takes Melatonin nightly   Joint pain    Osteoarthritis    Osteoporosis    PSVT (paroxysmal supraventricular tachycardia)    Recurrent upper respiratory infection (URI)    Rheumatoid arthritis (HCC)    sees Dr. Dierdre Forth; stable on Orencia   Rhinitis, allergic    uses Flonase daily   Urinary urgency    Urticaria       Social History   Tobacco Use   Smoking status: Former    Packs/day: 1.00    Years: 20.00    Additional pack years: 0.00    Total pack years: 20.00    Types: Cigarettes    Quit date: 106    Years since quitting: 26.4    Passive exposure: Never   Smokeless tobacco: Never  Vaping Use   Vaping Use: Never used  Substance Use Topics   Alcohol use: Yes    Comment: rarely with dinner   Drug use: No    Past Surgical History:  Procedure Laterality Date   ADENOIDECTOMY     APPENDECTOMY     BACK SURGERY  2012   at age 44 d/t scoliosis   BLEPHAROPLASTY  2018   BREAST BIOPSY     cataract surgery     COLONOSCOPY     HARDWARE REMOVAL Left 02/24/2020   Procedure: HARDWARE REMOVAL LEFT DISTAL RADIUS;  Surgeon: Betha Loa, MD;  Location: [facility];  Service: Orthopedics;  Laterality: Left;   KNEE ARTHROSCOPY Bilateral    neuroma removed from foot     unsure of which  foot   OPEN REDUCTION INTERNAL FIXATION (ORIF) DISTAL RADIAL FRACTURE Left 08/15/2019   Procedure: OPEN REDUCTION INTERNAL FIXATION (ORIF) DISTAL RADIAL FRACTURE;  Surgeon: Betha Loa, MD;  Location: [facility];  Service: Orthopedics;  Laterality: Left;  block in preop   POSTERIOR CERVICAL FUSION/FORAMINOTOMY N/A 05/29/2020   Procedure: Removal of hardware, Replacement of thoracic 8, thoracic 9, thoracic 10 screws, Right Thoracic 6-7, Right Thoracic 7-8 Laminectomies with thoracic hooks from Thoracic 4-5 to Thoracic 10;  Surgeon: Maeola Harman, MD;  Location: [facility] OR;  Service: Neurosurgery;  Laterality: N/A;   POSTERIOR LUMBAR FUSION 4 LEVEL N/A 02/15/2018   Procedure: Decompression and fusion Thoracic nine to Lumbar two with exploration of previous fusion;  Surgeon: Maeola Harman, MD;  Location: [facility] OR;  Service: Neurosurgery;  Laterality: N/A;   SHOULDER SURGERY Right    TONSILLECTOMY     TOTAL HIP ARTHROPLASTY Right 04/15/2014   Procedure: RIGHT TOTAL HIP ARTHROPLASTY ANTERIOR APPROACH;   Surgeon: Sheral Apley, MD;  Location: [facility] OR;  Service: Orthopedics;  Laterality: Right;   TOTAL KNEE ARTHROPLASTY Left 11/26/2019   Procedure: TOTAL KNEE ARTHROPLASTY;  Surgeon: Durene Romans, MD;  Location: [facility]S;  Service: Orthopedics;  Laterality: Left;  70 mins    Family History  Problem Relation Age of Onset   Atrial fibrillation Mother    Stroke Mother    Allergic rhinitis Mother    Allergic rhinitis Father    AAA (abdominal aortic aneurysm) Father    COPD Father    Prostate cancer Father    Atrial fibrillation Brother    Hypertension Other        unspecified grandmother   AAA (abdominal aortic aneurysm) Other        unspecified grandfather   Cancer Other        unspecified grandfather   Neuropathy Neg Hx     Allergies  Allergen Reactions   Advair Hfa [Fluticasone-Salmeterol] Other (See Comments)    Hoarness   Statins Other (See Comments)   Breo Ellipta [Fluticasone Furoate-Vilanterol] Other (See Comments)    Severe hoarseness    Fosamax [Alendronate Sodium] Other (See Comments)    myalgias   Levaquin [Levofloxacin] Other (See Comments)    Lightheadedness, not feeling well, ear pain   Infliximab Other (See Comments)    Skin reaction   Spiriva Handihaler [Tiotropium Bromide Monohydrate]     UNSPECIFIED REACTION    Verapamil    Tape Rash    PAPER TAPE: Causes severe rash and weeping    Current Medications:   Current Outpatient Medications:    albuterol (PROAIR HFA) 108 (90 Base) MCG/ACT inhaler, Inhale two puffs every 4-6 hours if needed for cough or wheeze., Disp: 8.5 Inhaler, Rfl: 1   amLODipine (NORVASC) 10 MG tablet, TAKE ONE TABLET BY MOUTH DAILY, Disp: 90 tablet, Rfl: 0   amoxicillin-clavulanate (AUGMENTIN) 875-125 MG tablet, Take 1 tablet by mouth 2 (two) times daily., Disp: 20 tablet, Rfl: 0   aspirin EC 81 MG tablet, Take 1 tablet (81 mg total) by mouth daily. Swallow whole., Disp: 30 tablet, Rfl: 11   azelastine (ASTELIN) 0.1 % nasal spray, Place 1  spray into both nostrils 2 (two) times daily. Use in each nostril as directed, Disp: , Rfl:    Bempedoic Acid (NEXLETOL) 180 MG TABS, Take 1 Dose by mouth daily., Disp: 90 tablet, Rfl: 3   betamethasone dipropionate 0.05 % cream, SMARTSIG:1 sparingly Topical Twice Daily, Disp: , Rfl:    budesonide (PULMICORT) 0.5  MG/2ML nebulizer solution, Take by nebulization., Disp: , Rfl:    Calcium Carb-Cholecalciferol (CALCIUM 600/VITAMIN D3 PO), Take 1 tablet by mouth daily., Disp: , Rfl:    carboxymethylcellulose (REFRESH PLUS) 0.5 % SOLN, Place 1 drop into both eyes 3 (three) times daily as needed (dry eyes). , Disp: , Rfl:    Cetirizine HCl (ZYRTEC ALLERGY) 10 MG CAPS, Zyrtec 10 mg capsule, Disp: , Rfl:    Cholecalciferol 125 MCG (5000 UT) TABS, Vitamin D3 125 mcg (5,000 unit) tablet  Take 1 tablet every day by oral route., Disp: , Rfl:    cyanocobalamin (VITAMIN B12) 1000 MCG/ML injection, Inject 1 mL (1,000 mcg total) into the muscle as needed. Inject 1 mL (1,000 mcg total) into thigh muscle once weekly for 3 weeks then once monthly for 3 months, Disp: 6 mL, Rfl: 0   denosumab (PROLIA) 60 MG/ML SOSY injection, Inject 60 mg into the skin every 6 (six) months. Courier to rheum: [address], [address], [place] [address]. Appt on 05/11/22, Disp: 1 mL, Rfl: 0   diphenhydrAMINE (BENADRYL) 25 MG tablet, Take 50 mg by mouth daily as needed for allergies., Disp: , Rfl:    docusate sodium (COLACE) 100 MG capsule, , Disp: , Rfl:    escitalopram (LEXAPRO) 20 MG tablet, Take 1 tablet (20 mg total) by mouth daily., Disp: 90 tablet, Rfl: 3   Evolocumab (REPATHA SURECLICK) 140 MG/ML SOAJ, INJECT 1 DOSE INTO THE SKIN EVERY 14 (FOURTEEN) DAYS, Disp: 6 mL, Rfl: 3   ezetimibe (ZETIA) 10 MG tablet, TAKE ONE TABLET BY MOUTH DAILY, Disp: 90 tablet, Rfl: 1   famotidine (PEPCID) 40 MG tablet, TAKE 1 TABLET (40 MG TOTAL) BY MOUTH AT BEDTIME., Disp: 30 tablet, Rfl: 5   furosemide (LASIX) 20 MG tablet, Take 1 tablet (20 mg  total) by mouth daily., Disp: 90 tablet, Rfl: 1   gabapentin (NEURONTIN) 300 MG capsule, Take 1 capsule (300 mg total) by mouth at bedtime., Disp: 90 capsule, Rfl: 3   guaiFENesin (MUCINEX) 600 MG 12 hr tablet, Take 600 mg by mouth 2 (two) times daily as needed for cough or to loosen phlegm., Disp: , Rfl:    HYDROcodone-acetaminophen (NORCO) 10-325 MG tablet, 1 tablet every 6 (six) hours as needed., Disp: , Rfl:    indomethacin (INDOCIN SR) 75 MG CR capsule, TAKE ONE CAPSULE BY MOUTH DAILY WITH BREAKFAST, Disp: 90 capsule, Rfl: 0   ipratropium (ATROVENT) 0.03 % nasal spray, Place 1 spray into both nostrils 2 (two) times daily as needed for rhinitis., Disp: , Rfl:    irbesartan (AVAPRO) 300 MG tablet, TAKE ONE TABLET BY MOUTH DAILY, Disp: 90 tablet, Rfl: 1   Lifitegrast (XIIDRA) 5 % SOLN, Place 1 drop into both eyes in the morning and at bedtime. , Disp: , Rfl:    methocarbamol (ROBAXIN) 500 MG tablet, Take 1 tablet (500 mg total) by mouth every 6 (six) hours as needed for muscle spasms., Disp: 60 tablet, Rfl: 2   montelukast (SINGULAIR) 10 MG tablet, Take 1 tablet (10 mg total) by mouth at bedtime., Disp: 30 tablet, Rfl: 5   Multiple Vitamin (MULTIVITAMIN) capsule, Take 1 capsule by mouth daily., Disp: , Rfl:    omeprazole (PRILOSEC) 40 MG capsule, TAKE ONE CAPSULE BY MOUTH DAILY BEFORE BREAKFAST DAILY (Patient taking differently: Take 40 mg by mouth daily. TAKE ONE CAPSULE BY MOUTH DAILY BEFORE BREAKFAST DAILY), Disp: 30 capsule, Rfl: 4   OVER THE COUNTER MEDICATION, Place 1 application into both eyes  at bedtime. Genteal overnight eye ointment, Disp: , Rfl:    polyethylene glycol powder (GLYCOLAX/MIRALAX) 17 GM/SCOOP powder, Miralax 17 gram/dose oral powder, Disp: , Rfl:    predniSONE (DELTASONE) 20 MG tablet, Take 1 tablet (20 mg total) by mouth daily with breakfast., Disp: 5 tablet, Rfl: 0   predniSONE (DELTASONE) 5 MG tablet, TAKE 1 TABLET (5 MG TOTAL) BY MOUTH DAILY WITH BREAKFAST, Disp: 30  tablet, Rfl: 0   PRESCRIPTION MEDICATION, Place 1 spray into the nose in the morning and at bedtime. Budesonide 0.5 mg / 2 mL Nasal rinse, Disp: , Rfl:    pseudoephedrine (SUDAFED) 30 MG tablet, Take 30 mg by mouth every 4 (four) hours as needed for congestion., Disp: , Rfl:    SYMBICORT 160-4.5 MCG/ACT inhaler, Inhale 2 puffs into the lungs 2 (two) times daily., Disp: , Rfl:    SYRINGE-NEEDLE, DISP, 3 ML (B-D 3CC LUER-LOK SYR 25GX1") 25G X 1" 3 ML MISC, USE TO INJECT VITAMIN B12, Disp: 10 each, Rfl: 0   traZODone (DESYREL) 150 MG tablet, TAKE ONE TABLET BY MOUTH DAILY, Disp: 90 tablet, Rfl: 0   Upadacitinib ER (RINVOQ) 15 MG TB24, Take 1 tablet (15 mg total) by mouth daily., Disp: 30 tablet, Rfl: 2   valACYclovir (VALTREX) 1000 MG tablet, Take 1,000 mg by mouth daily. , Disp: , Rfl:    vitamin C (ASCORBIC ACID) 500 MG tablet, Take 500 mg by mouth daily., Disp: , Rfl:    amoxicillin (AMOXIL) 500 MG tablet, , Disp: , Rfl:    Review of Systems:   Review of Systems  HENT:  Positive for congestion.   Respiratory:  Positive for cough (Productive), shortness of breath and wheezing.     Vitals:   Vitals:   09/29/22 0922  BP: 138/84  Pulse: 91  Temp: 97.8 F (36.6 C)  SpO2: 95%  Weight: 188 lb 11.2 oz (85.6 kg)  Height: 5' 6.5" (1.689 m)     Body mass index is 30 kg/m.  Physical Exam:   Physical Exam Vitals and nursing note reviewed.  Constitutional:      General: She is not in acute distress.    Appearance: She is well-developed. She is not ill-appearing or toxic-appearing.  HENT:     Head: Normocephalic and atraumatic.     Right Ear: Tympanic membrane, ear canal and external ear normal. Tympanic membrane is not erythematous, retracted or bulging.     Left Ear: Tympanic membrane, ear canal and external ear normal. Tympanic membrane is not erythematous, retracted or bulging.     Nose: Nose normal.     Right Sinus: No maxillary sinus tenderness or frontal sinus tenderness.      Left Sinus: No maxillary sinus tenderness or frontal sinus tenderness.     Mouth/Throat:     Pharynx: Uvula midline. No posterior oropharyngeal erythema.  Eyes:     General: Lids are normal.     Conjunctiva/sclera: Conjunctivae normal.  Neck:     Trachea: Trachea normal.  Cardiovascular:     Rate and Rhythm: Normal rate and regular rhythm.     Pulses: Normal pulses.     Heart sounds: Normal heart sounds, S1 normal and S2 normal.  Pulmonary:     Effort: Pulmonary effort is normal.     Breath sounds: Decreased breath sounds present. No wheezing, rhonchi or rales.  Lymphadenopathy:     Cervical: No cervical adenopathy.  Skin:    General: Skin is warm and dry.  Neurological:  Mental Status: She is alert.     GCS: GCS eye subscore is 4. GCS verbal subscore is 5. GCS motor subscore is 6.  Psychiatric:        Speech: Speech normal.        Behavior: Behavior normal. Behavior is cooperative.     Assessment and Plan:   Acute cough No red flags on exam.  Will initiate augmentin and prednisone 40 mg x 5 days per orders. Discussed taking medications as prescribed. Reviewed return precautions including worsening fever, SOB, worsening cough or other concerns. Push fluids and rest. I recommend that patient follow-up if symptoms worsen or persist despite treatment x 7-10 days, sooner if needed.   I,Rachel Rivera,acting as a Neurosurgeon for [employer], PA.,have documented all relevant documentation on the behalf of Jarold Motto, PA,as directed by  Jarold Motto, PA while in the presence of Jarold Motto, [place].   I, Jarold Motto, [place], have reviewed all documentation for this visit. The documentation on 09/29/22 for the exam, diagnosis, procedures, and orders are all accurate and complete.   Jarold Motto, PA-C

## 2022-09-29 NOTE — Patient Instructions (Addendum)
It was great to see you!  Take antibiotic(s) as prescribed Start another round of prednisone  Please keep me posted on your progress/symptom(s)   Take care,  Jarold Motto PA-C

## 2022-10-06 ENCOUNTER — Encounter: Payer: Self-pay | Admitting: Internal Medicine

## 2022-10-06 ENCOUNTER — Encounter (INDEPENDENT_AMBULATORY_CARE_PROVIDER_SITE_OTHER): Payer: Medicare Other | Admitting: Ophthalmology

## 2022-10-06 ENCOUNTER — Encounter (INDEPENDENT_AMBULATORY_CARE_PROVIDER_SITE_OTHER): Payer: Self-pay

## 2022-10-06 DIAGNOSIS — H401132 Primary open-angle glaucoma, bilateral, moderate stage: Secondary | ICD-10-CM | POA: Diagnosis not present

## 2022-10-06 DIAGNOSIS — H43812 Vitreous degeneration, left eye: Secondary | ICD-10-CM | POA: Diagnosis not present

## 2022-10-06 DIAGNOSIS — H33311 Horseshoe tear of retina without detachment, right eye: Secondary | ICD-10-CM | POA: Diagnosis not present

## 2022-10-06 DIAGNOSIS — H43393 Other vitreous opacities, bilateral: Secondary | ICD-10-CM | POA: Diagnosis not present

## 2022-10-06 DIAGNOSIS — H43811 Vitreous degeneration, right eye: Secondary | ICD-10-CM | POA: Diagnosis not present

## 2022-10-07 NOTE — Telephone Encounter (Signed)
Patient last seen on 09/21/2022. Please advise.

## 2022-10-13 DIAGNOSIS — H17822 Peripheral opacity of cornea, left eye: Secondary | ICD-10-CM | POA: Diagnosis not present

## 2022-10-13 DIAGNOSIS — H0102B Squamous blepharitis left eye, upper and lower eyelids: Secondary | ICD-10-CM | POA: Diagnosis not present

## 2022-10-13 DIAGNOSIS — E7849 Other hyperlipidemia: Secondary | ICD-10-CM | POA: Diagnosis not present

## 2022-10-13 DIAGNOSIS — H16223 Keratoconjunctivitis sicca, not specified as Sjogren's, bilateral: Secondary | ICD-10-CM | POA: Diagnosis not present

## 2022-10-13 DIAGNOSIS — H16213 Exposure keratoconjunctivitis, bilateral: Secondary | ICD-10-CM | POA: Diagnosis not present

## 2022-10-13 DIAGNOSIS — Z961 Presence of intraocular lens: Secondary | ICD-10-CM | POA: Diagnosis not present

## 2022-10-13 DIAGNOSIS — H0102A Squamous blepharitis right eye, upper and lower eyelids: Secondary | ICD-10-CM | POA: Diagnosis not present

## 2022-10-13 DIAGNOSIS — H40013 Open angle with borderline findings, low risk, bilateral: Secondary | ICD-10-CM | POA: Diagnosis not present

## 2022-10-14 LAB — NMR, LIPOPROFILE
Cholesterol, Total: 123 mg/dL (ref 100–199)
HDL Particle Number: 49.5 umol/L (ref >=30.5)
HDL-C: 57 mg/dL (ref >39)
LDL Particle Number: 334 nmol/L (ref <1000)
LDL Size: 19.7 nm — ABNORMAL LOW (ref >20.5)
LDL-C (NIH Calc): 34 mg/dL (ref 0–99)
LP-IR Score: 69 — ABNORMAL HIGH (ref <=45)
Small LDL Particle Number: 161 nmol/L (ref <=527)
Triglycerides: 208 mg/dL — ABNORMAL HIGH (ref 0–149)

## 2022-10-17 ENCOUNTER — Other Ambulatory Visit: Payer: Self-pay | Admitting: Physician Assistant

## 2022-10-17 ENCOUNTER — Other Ambulatory Visit (HOSPITAL_COMMUNITY): Payer: Self-pay

## 2022-10-17 ENCOUNTER — Other Ambulatory Visit: Payer: Self-pay | Admitting: *Deleted

## 2022-10-17 DIAGNOSIS — M0600 Rheumatoid arthritis without rheumatoid factor, unspecified site: Secondary | ICD-10-CM

## 2022-10-17 NOTE — Telephone Encounter (Signed)
Refill request received via fax from [facility] for Rinvoq   Last Fill: 07/26/2022  Labs: 09/21/2022 Sed rate remains in normal range. White blood count is increased to 14.5 probably from the respiratory infection. Kidney function is slightly decrease but her potassium and chloride are also off so I suspect this is a mild dehydration effect. No problem for continuing rinvoq.   TB Gold: 09/10/2021 Neg   Next Visit: 12/27/2022  Last Visit: 09/21/2022   DX: Seronegative rheumatoid arthritis   Current Dose per office note 09/21/2022: rinvoq 15 mg daily   Okay to refill Rinvoq?

## 2022-10-18 MED ORDER — RINVOQ 15 MG PO TB24: 15.0000 mg | ORAL_TABLET | Freq: Every day | ORAL | 2 refills | Status: DC

## 2022-10-18 NOTE — Telephone Encounter (Signed)
Advised patient not to take Rinvoq if she is has an infection.  TB Gold is due now she should get it with her next labs.

## 2022-10-18 NOTE — Telephone Encounter (Signed)
Patient advised patient not to take Rinvoq if she is has an infection. TB Gold is due now she should get it with her next labs. Patient states she would like to discuss with Dr. Dimple Casey the Rinvoq. Patient states she has had increased coughing when taking the Rinvoq. Patient states when off the Rinvoq the coughing goes away. Patient has scheduled an appointment to discuss treatment options.

## 2022-10-19 NOTE — Progress Notes (Signed)
Vanessa Harmon is a 72 y.o. female here for a follow up of a pre-existing problem.  History of Present Illness:   Chief Complaint  Patient presents with   Hypertension    HPI  Hypertension Treated with amlodipine 10 mg daily, irbesartan 300 mg daily and lasix 20 mg. Blood pressures measured at home are as follows: normal. Blood pressure normal today at 136/70. Denies chest pain, shortness of breath, lower extremity swelling.  Anxiety and Depression Well-controlled with Lexapro 20 mg daily. She is ready to start therapy. Denies SI/HI.  Lumbar radiculopathy; Rheumatoid arthritis; Osteoarthritis; Inflammatory Joint Disease Treated with Rinvoq 15 mg daily, Norco 10-325 mg every six hours as needed, gabapentin 300 mg at bedtime, indomethacin 75 mg with breakfast. Followed by rheumatologist, Dr. Dimple Casey. Had a visit with Dr. Cherylann Ratel in May that didn't really provide her with much more insight into pain treatment.  Diabetes 12 month follow-up. Current DM meds: none. Blood sugars at home are: not checked. Patient is compliant with medications. Denies: hypoglycemic or hyperglycemic episodes or symptoms.  Lab Results  Component Value Date   HGBA1C 5.9 10/25/2021      Past Medical History:  Diagnosis Date   Adenomatous polyp    Allergy    Anxiety    takes Ativan daily as needed   Arthritis    inflammatory arthritis (Dr. Verdie Shire)   Asthma    Cataract    Chronic back pain    scoliosis    Constipation    takes miralax every other day   COVID 05/19/2022   Depression    takes Lexapro daily   Eczema    Essential hypertension, benign    takes Hyzaar and Metoprolol daily   Family history of adverse reaction to anesthesia    Sister PONV   Fibromyalgia    GERD (gastroesophageal reflux disease)    History of colon polyps    History of shingles    Hyperlipidemia    takes Atorvastatin daily   Insomnia    takes Melatonin nightly   Joint pain    Osteoarthritis     Osteoporosis    PSVT (paroxysmal supraventricular tachycardia)    Recurrent upper respiratory infection (URI)    Rheumatoid arthritis (HCC)    sees Dr. Dierdre Forth; stable on Orencia   Rhinitis, allergic    uses Flonase daily   Urinary urgency    Urticaria      Social History   Tobacco Use   Smoking status: Former    Packs/day: 1.00    Years: 20.00    Additional pack years: 0.00    Total pack years: 20.00    Types: Cigarettes    Quit date: 68    Years since quitting: 26.5    Passive exposure: Never   Smokeless tobacco: Never  Vaping Use   Vaping Use: Never used  Substance Use Topics   Alcohol use: Yes    Comment: rarely with dinner   Drug use: No    Past Surgical History:  Procedure Laterality Date   ADENOIDECTOMY     APPENDECTOMY     BACK SURGERY  2012   at age 45 d/t scoliosis   BLEPHAROPLASTY  2018   BREAST BIOPSY     cataract surgery     COLONOSCOPY     HARDWARE REMOVAL Left 02/24/2020   Procedure: HARDWARE REMOVAL LEFT DISTAL RADIUS;  Surgeon: Betha Loa, MD;  Location: [facility];  Service: Orthopedics;  Laterality: Left;   KNEE  ARTHROSCOPY Bilateral    neuroma removed from foot     unsure of which foot   OPEN REDUCTION INTERNAL FIXATION (ORIF) DISTAL RADIAL FRACTURE Left 08/15/2019   Procedure: OPEN REDUCTION INTERNAL FIXATION (ORIF) DISTAL RADIAL FRACTURE;  Surgeon: Betha Loa, MD;  Location: [facility];  Service: Orthopedics;  Laterality: Left;  block in preop   POSTERIOR CERVICAL FUSION/FORAMINOTOMY N/A 05/29/2020   Procedure: Removal of hardware, Replacement of thoracic 8, thoracic 9, thoracic 10 screws, Right Thoracic 6-7, Right Thoracic 7-8 Laminectomies with thoracic hooks from Thoracic 4-5 to Thoracic 10;  Surgeon: Maeola Harman, MD;  Location: [facility] OR;  Service: Neurosurgery;  Laterality: N/A;   POSTERIOR LUMBAR FUSION 4 LEVEL N/A 02/15/2018   Procedure: Decompression and fusion Thoracic nine to Lumbar two with exploration  of previous fusion;  Surgeon: Maeola Harman, MD;  Location: [facility] OR;  Service: Neurosurgery;  Laterality: N/A;   SHOULDER SURGERY Right    TONSILLECTOMY     TOTAL HIP ARTHROPLASTY Right 04/15/2014   Procedure: RIGHT TOTAL HIP ARTHROPLASTY ANTERIOR APPROACH;  Surgeon: Sheral Apley, MD;  Location: [facility] OR;  Service: Orthopedics;  Laterality: Right;   TOTAL KNEE ARTHROPLASTY Left 11/26/2019   Procedure: TOTAL KNEE ARTHROPLASTY;  Surgeon: Durene Romans, MD;  Location: [facility];  Service: Orthopedics;  Laterality: Left;  70 mins    Family History  Problem Relation Age of Onset   Atrial fibrillation Mother    Stroke Mother    Allergic rhinitis Mother    Allergic rhinitis Father    AAA (abdominal aortic aneurysm) Father    COPD Father    Prostate cancer Father    Atrial fibrillation Brother    Hypertension Other        unspecified grandmother   AAA (abdominal aortic aneurysm) Other        unspecified grandfather   Cancer Other        unspecified grandfather   Neuropathy Neg Hx     Allergies  Allergen Reactions   Advair Hfa [Fluticasone-Salmeterol] Other (See Comments)    Hoarness   Statins Other (See Comments)   Breo Ellipta [Fluticasone Furoate-Vilanterol] Other (See Comments)    Severe hoarseness    Fosamax [Alendronate Sodium] Other (See Comments)    myalgias   Levaquin [Levofloxacin] Other (See Comments)    Lightheadedness, not feeling well, ear pain   Infliximab Other (See Comments)    Skin reaction   Spiriva Handihaler [Tiotropium Bromide Monohydrate]     UNSPECIFIED REACTION    Verapamil    Tape Rash    PAPER TAPE: Causes severe rash and weeping    Current Medications:   Current Outpatient Medications:    albuterol (PROAIR HFA) 108 (90 Base) MCG/ACT inhaler, Inhale two puffs every 4-6 hours if needed for cough or wheeze., Disp: 8.5 Inhaler, Rfl: 1   amLODipine (NORVASC) 10 MG tablet, TAKE ONE TABLET BY MOUTH DAILY, Disp: 90 tablet, Rfl: 0   aspirin EC 81 MG tablet,  Take 1 tablet (81 mg total) by mouth daily. Swallow whole., Disp: 30 tablet, Rfl: 11   azelastine (ASTELIN) 0.1 % nasal spray, Place 1 spray into both nostrils 2 (two) times daily. Use in each nostril as directed, Disp: , Rfl:    Bempedoic Acid (NEXLETOL) 180 MG TABS, Take 1 Dose by mouth daily., Disp: 90 tablet, Rfl: 3   betamethasone dipropionate 0.05 % cream, SMARTSIG:1 sparingly Topical Twice Daily, Disp: , Rfl:    budesonide (PULMICORT) 0.5 MG/2ML nebulizer solution, Take by  nebulization., Disp: , Rfl:    Calcium Carb-Cholecalciferol (CALCIUM 600/VITAMIN D3 PO), Take 1 tablet by mouth daily., Disp: , Rfl:    carboxymethylcellulose (REFRESH PLUS) 0.5 % SOLN, Place 1 drop into both eyes 3 (three) times daily as needed (dry eyes). , Disp: , Rfl:    Cetirizine HCl (ZYRTEC ALLERGY) 10 MG CAPS, Zyrtec 10 mg capsule, Disp: , Rfl:    Cholecalciferol 125 MCG (5000 UT) TABS, Vitamin D3 125 mcg (5,000 unit) tablet  Take 1 tablet every day by oral route., Disp: , Rfl:    cyanocobalamin (VITAMIN B12) 1000 MCG/ML injection, Inject 1 mL (1,000 mcg total) into the muscle as needed. Inject 1 mL (1,000 mcg total) into thigh muscle once weekly for 3 weeks then once monthly for 3 months, Disp: 6 mL, Rfl: 0   denosumab (PROLIA) 60 MG/ML SOSY injection, Inject 60 mg into the skin every 6 (six) months. Courier to rheum: [address], [address], [place] [address]. Appt on 05/11/22, Disp: 1 mL, Rfl: 0   diphenhydrAMINE (BENADRYL) 25 MG tablet, Take 50 mg by mouth daily as needed for allergies., Disp: , Rfl:    docusate sodium (COLACE) 100 MG capsule, 100 mg 2 (two) times daily., Disp: , Rfl:    escitalopram (LEXAPRO) 20 MG tablet, Take 1 tablet (20 mg total) by mouth daily., Disp: 90 tablet, Rfl: 3   Evolocumab (REPATHA SURECLICK) 140 MG/ML SOAJ, INJECT 1 DOSE INTO THE SKIN EVERY 14 (FOURTEEN) DAYS, Disp: 6 mL, Rfl: 3   ezetimibe (ZETIA) 10 MG tablet, TAKE ONE TABLET BY MOUTH DAILY, Disp: 90 tablet, Rfl: 0    famotidine (PEPCID) 40 MG tablet, TAKE 1 TABLET (40 MG TOTAL) BY MOUTH AT BEDTIME., Disp: 30 tablet, Rfl: 5   furosemide (LASIX) 20 MG tablet, TAKE ONE TABLET BY MOUTH DAILY, Disp: 90 tablet, Rfl: 0   gabapentin (NEURONTIN) 300 MG capsule, Take 1 capsule (300 mg total) by mouth at bedtime., Disp: 90 capsule, Rfl: 3   guaiFENesin (MUCINEX) 600 MG 12 hr tablet, Take 600 mg by mouth 2 (two) times daily as needed for cough or to loosen phlegm., Disp: , Rfl:    HYDROcodone-acetaminophen (NORCO) 10-325 MG tablet, 1 tablet every 6 (six) hours as needed., Disp: , Rfl:    indomethacin (INDOCIN SR) 75 MG CR capsule, TAKE ONE CAPSULE BY MOUTH DAILY WITH BREAKFAST, Disp: 90 capsule, Rfl: 0   ipratropium (ATROVENT) 0.03 % nasal spray, Place 1 spray into both nostrils 2 (two) times daily as needed for rhinitis., Disp: , Rfl:    irbesartan (AVAPRO) 300 MG tablet, TAKE ONE TABLET BY MOUTH DAILY, Disp: 90 tablet, Rfl: 1   Lifitegrast (XIIDRA) 5 % SOLN, Place 1 drop into both eyes in the morning and at bedtime. , Disp: , Rfl:    methocarbamol (ROBAXIN) 500 MG tablet, Take 1 tablet (500 mg total) by mouth every 6 (six) hours as needed for muscle spasms., Disp: 60 tablet, Rfl: 2   montelukast (SINGULAIR) 10 MG tablet, Take 1 tablet (10 mg total) by mouth at bedtime., Disp: 30 tablet, Rfl: 5   Multiple Vitamin (MULTIVITAMIN) capsule, Take 1 capsule by mouth daily., Disp: , Rfl:    omeprazole (PRILOSEC) 40 MG capsule, TAKE ONE CAPSULE BY MOUTH DAILY BEFORE BREAKFAST DAILY (Patient taking differently: Take 40 mg by mouth daily. TAKE ONE CAPSULE BY MOUTH DAILY BEFORE BREAKFAST DAILY), Disp: 30 capsule, Rfl: 4   OVER THE COUNTER MEDICATION, Place 1 application into both eyes at bedtime. Carvel Getting  overnight eye ointment, Disp: , Rfl:    polyethylene glycol powder (GLYCOLAX/MIRALAX) 17 GM/SCOOP powder, Miralax 17 gram/dose oral powder, Disp: , Rfl:    predniSONE (DELTASONE) 5 MG tablet, TAKE 1 TABLET (5 MG TOTAL) BY MOUTH DAILY  WITH BREAKFAST, Disp: 30 tablet, Rfl: 0   PRESCRIPTION MEDICATION, Place 1 spray into the nose in the morning and at bedtime. Budesonide 0.5 mg / 2 mL Nasal rinse, Disp: , Rfl:    pseudoephedrine (SUDAFED) 30 MG tablet, Take 30 mg by mouth every 4 (four) hours as needed for congestion., Disp: , Rfl:    SYMBICORT 160-4.5 MCG/ACT inhaler, Inhale 2 puffs into the lungs 2 (two) times daily., Disp: , Rfl:    SYRINGE-NEEDLE, DISP, 3 ML (B-D 3CC LUER-LOK SYR 25GX1") 25G X 1" 3 ML MISC, USE TO INJECT VITAMIN B12, Disp: 10 each, Rfl: 0   traZODone (DESYREL) 150 MG tablet, TAKE ONE TABLET BY MOUTH DAILY, Disp: 90 tablet, Rfl: 0   valACYclovir (VALTREX) 1000 MG tablet, Take 1,000 mg by mouth daily. , Disp: , Rfl:    vitamin C (ASCORBIC ACID) 500 MG tablet, Take 500 mg by mouth daily., Disp: , Rfl:    Review of Systems:   Review of Systems  Constitutional:  Negative for fever and malaise/fatigue.  HENT:  Negative for congestion.   Eyes:  Negative for blurred vision.  Respiratory:  Positive for cough. Negative for shortness of breath.   Cardiovascular:  Negative for chest pain, palpitations and leg swelling.  Gastrointestinal:  Negative for vomiting.  Musculoskeletal:  Positive for back pain and joint pain.  Skin:  Negative for rash.  Neurological:  Negative for loss of consciousness and headaches.    Vitals:   Vitals:   10/26/22 1307  BP: 136/70  Pulse: 88  Temp: (!) 97.3 F (36.3 C)  TempSrc: Temporal  SpO2: 95%  Weight: 184 lb 8 oz (83.7 kg)  Height: 5' 6.5" (1.689 m)     Body mass index is 29.33 kg/m.  Physical Exam:   Physical Exam Vitals and nursing note reviewed.  Constitutional:      General: She is not in acute distress.    Appearance: She is well-developed. She is not ill-appearing or toxic-appearing.  Cardiovascular:     Rate and Rhythm: Normal rate and regular rhythm.     Pulses: Normal pulses.     Heart sounds: Normal heart sounds, S1 normal and S2 normal.   Pulmonary:     Effort: Pulmonary effort is normal.     Breath sounds: Normal breath sounds.  Skin:    General: Skin is warm and dry.  Neurological:     Mental Status: She is alert.     GCS: GCS eye subscore is 4. GCS verbal subscore is 5. GCS motor subscore is 6.  Psychiatric:        Speech: Speech normal.        Behavior: Behavior normal. Behavior is cooperative.     Assessment and Plan:   Essential (primary) hypertension Normotensive Continue amlodipine 10 mg daily, irbesartan 300 mg daily and lasix 20 mg Follow-up in 6 month(s), sooner if concerns  B12 deficiency Update B12 and provide recommendations accordingly  Controlled type 2 diabetes mellitus with complication, without long-term current use of insulin (HCC) Suspect well controlled Update A1c today  Other depression Ongoing Continue Lexapro 20 mg daily Referral placed for talk therapy Denies SI/HI    I,Alexander Ruley,acting as a scribe for [employer], PA.,have documented all relevant  documentation on the behalf of Jarold Motto, PA,as directed by  Jarold Motto, PA while in the presence of Jarold Motto, [place].   I, Jarold Motto, [place], have reviewed all documentation for this visit. The documentation on 10/26/22 for the exam, diagnosis, procedures, and orders are all accurate and complete.    Jarold Motto, PA-C

## 2022-10-26 ENCOUNTER — Other Ambulatory Visit: Payer: Self-pay | Admitting: Physician Assistant

## 2022-10-26 ENCOUNTER — Ambulatory Visit (INDEPENDENT_AMBULATORY_CARE_PROVIDER_SITE_OTHER): Payer: Medicare Other | Admitting: Physician Assistant

## 2022-10-26 ENCOUNTER — Encounter: Payer: Self-pay | Admitting: Physician Assistant

## 2022-10-26 ENCOUNTER — Other Ambulatory Visit (HOSPITAL_COMMUNITY): Payer: Self-pay

## 2022-10-26 ENCOUNTER — Other Ambulatory Visit: Payer: Self-pay | Admitting: Internal Medicine

## 2022-10-26 VITALS — BP 136/70 | HR 88 | Temp 97.3°F | Ht 66.5 in | Wt 184.5 lb

## 2022-10-26 DIAGNOSIS — E118 Type 2 diabetes mellitus with unspecified complications: Secondary | ICD-10-CM

## 2022-10-26 DIAGNOSIS — E538 Deficiency of other specified B group vitamins: Secondary | ICD-10-CM

## 2022-10-26 DIAGNOSIS — F3289 Other specified depressive episodes: Secondary | ICD-10-CM

## 2022-10-26 DIAGNOSIS — I10 Essential (primary) hypertension: Secondary | ICD-10-CM

## 2022-10-26 LAB — COMPREHENSIVE METABOLIC PANEL
ALT: 17 U/L (ref 0–35)
AST: 17 U/L (ref 0–37)
Albumin: 4.2 g/dL (ref 3.5–5.2)
Alkaline Phosphatase: 36 U/L — ABNORMAL LOW (ref 39–117)
BUN: 18 mg/dL (ref 6–23)
CO2: 29 mEq/L (ref 19–32)
Calcium: 10.3 mg/dL (ref 8.4–10.5)
Chloride: 101 mEq/L (ref 96–112)
Creatinine, Ser: 1.15 mg/dL (ref 0.40–1.20)
GFR: 47.27 mL/min — ABNORMAL LOW (ref >60.00)
Glucose, Bld: 119 mg/dL — ABNORMAL HIGH (ref 70–99)
Potassium: 4.5 mEq/L (ref 3.5–5.1)
Sodium: 141 mEq/L (ref 135–145)
Total Bilirubin: 0.6 mg/dL (ref 0.2–1.2)
Total Protein: 6.4 g/dL (ref 6.0–8.3)

## 2022-10-26 LAB — HEMOGLOBIN A1C: Hgb A1c MFr Bld: 6 % (ref 4.6–6.5)

## 2022-10-26 LAB — CBC
HCT: 36.5 % (ref 36.0–46.0)
Hemoglobin: 11.8 g/dL — ABNORMAL LOW (ref 12.0–15.0)
MCHC: 32.4 g/dL (ref 30.0–36.0)
MCV: 100.4 fl — ABNORMAL HIGH (ref 78.0–100.0)
Platelets: 360 10*3/uL (ref 150.0–400.0)
RBC: 3.64 Mil/uL — ABNORMAL LOW (ref 3.87–5.11)
RDW: 14.4 % (ref 11.5–15.5)
WBC: 11.9 10*3/uL — ABNORMAL HIGH (ref 4.0–10.5)

## 2022-10-26 LAB — VITAMIN B12: Vitamin B-12: 856 pg/mL (ref 211–911)

## 2022-10-26 NOTE — Patient Instructions (Signed)
It was great to see you!  Message me if needing referral for acupuncture! One recommendation from a few patients of mine:    Talk therapy referral placed -- so glad we are starting this!  Update blood work today  Take care,  [employer] PA-C

## 2022-10-26 NOTE — Telephone Encounter (Signed)
Last Fill: 09/20/2022  Next Visit: 11/15/2022  Last Visit: 09/21/2022  Dx: Seronegative rheumatoid arthritis   Current Dose per office note on 09/21/2022: prednisone 5 mg daily   Okay to refill Prednisone?

## 2022-10-27 ENCOUNTER — Other Ambulatory Visit: Payer: Self-pay | Admitting: Physician Assistant

## 2022-10-27 DIAGNOSIS — R710 Precipitous drop in hematocrit: Secondary | ICD-10-CM

## 2022-11-03 ENCOUNTER — Other Ambulatory Visit: Payer: Self-pay

## 2022-11-03 ENCOUNTER — Other Ambulatory Visit: Payer: Self-pay | Admitting: Physician Assistant

## 2022-11-03 ENCOUNTER — Other Ambulatory Visit (HOSPITAL_COMMUNITY): Payer: Self-pay

## 2022-11-03 ENCOUNTER — Telehealth: Payer: Self-pay | Admitting: Pharmacist

## 2022-11-03 DIAGNOSIS — M810 Age-related osteoporosis without current pathological fracture: Secondary | ICD-10-CM

## 2022-11-03 MED ORDER — DENOSUMAB 60 MG/ML ~~LOC~~ SOSY
60.0000 mg | PREFILLED_SYRINGE | SUBCUTANEOUS | 0 refills | Status: DC
Start: 2022-11-03 — End: 2023-05-18
  Filled 2022-11-03: qty 1, 180d supply, fill #0

## 2022-11-03 NOTE — Telephone Encounter (Signed)
Patient due for Prolia on 11/07/2022. Labs wnl on 10/26/22. Vitamin D wnl on 06/15/22.  Rx for Prolia sent to [facility] to be couriered to clinic by 11/09/22.  ATC patient to schedule. Unable to reach. Left VM requesting return call.  Chesley Mires, PharmD, MPH, BCPS, CPP Clinical Pharmacist (Rheumatology and Pulmonology)

## 2022-11-07 NOTE — Telephone Encounter (Signed)
Prolia received from [facility]. Placed in fridge 

## 2022-11-07 NOTE — Telephone Encounter (Signed)
Patient scheduled for Prolia appt on 11/09/22  Chesley Mires, PharmD, MPH, BCPS, CPP Clinical Pharmacist (Rheumatology and Pulmonology)

## 2022-11-08 DIAGNOSIS — J4531 Mild persistent asthma with (acute) exacerbation: Secondary | ICD-10-CM | POA: Diagnosis not present

## 2022-11-08 DIAGNOSIS — J310 Chronic rhinitis: Secondary | ICD-10-CM | POA: Diagnosis not present

## 2022-11-08 DIAGNOSIS — J4540 Moderate persistent asthma, uncomplicated: Secondary | ICD-10-CM | POA: Diagnosis not present

## 2022-11-08 DIAGNOSIS — J4530 Mild persistent asthma, uncomplicated: Secondary | ICD-10-CM | POA: Diagnosis not present

## 2022-11-08 NOTE — Progress Notes (Unsigned)
Pharmacy Note  Subjective:   Patient presents to clinic today to receive bi-annual dose of Prolia.  Patient running a fever or have signs/symptoms of infection? {yes/no:20286}  Patient currently on antibiotics for the treatment of infection? {yes/no:20286}  Patient had fall in the last 6 months?  {yes/no:20286}  If yes, did it require medical attention? {yes/no:20286}   Patient taking calcium 1200 mg daily through diet or supplement and at least 800 units vitamin D? {yes/no:20286}  Objective: CMP     Component Value Date/Time   NA 141 10/26/2022 1337   NA 139 04/05/2021 0000   K 4.5 10/26/2022 1337   CL 101 10/26/2022 1337   CO2 29 10/26/2022 1337   GLUCOSE 119 (H) 10/26/2022 1337   BUN 18 10/26/2022 1337   BUN 13 04/05/2021 0000   CREATININE 1.15 10/26/2022 1337   CREATININE 1.12 (H) 09/21/2022 1405   CALCIUM 10.3 10/26/2022 1337   PROT 6.4 10/26/2022 1337   ALBUMIN 4.2 10/26/2022 1337   AST 17 10/26/2022 1337   ALT 17 10/26/2022 1337   ALKPHOS 36 (L) 10/26/2022 1337   BILITOT 0.6 10/26/2022 1337   GFRNONAA 63 04/05/2021 0000   GFRNONAA >60 05/27/2020 1333   GFRNONAA 57 (L) 04/13/2017 1201   GFRAA >60 11/27/2019 0244   GFRAA 66 04/13/2017 1201    CBC    Component Value Date/Time   WBC 11.9 (H) 10/26/2022 1337   RBC 3.64 (L) 10/26/2022 1337   HGB 11.8 (L) 10/26/2022 1337   HCT 36.5 10/26/2022 1337   PLT 360.0 10/26/2022 1337   MCV 100.4 (H) 10/26/2022 1337   MCH 32.1 09/21/2022 1405   MCHC 32.4 10/26/2022 1337   RDW 14.4 10/26/2022 1337   LYMPHSABS 1,842 09/21/2022 1405   MONOABS 0.8 06/15/2022 1119   EOSABS 145 09/21/2022 1405   BASOSABS 73 09/21/2022 1405    Lab Results  Component Value Date   VD25OH 64.50 06/15/2022    T-score(11/10/2021): -1.1   Assessment/Plan:   Patient tolerated injection  ***.   Patient is to return in 10-14 days for labs to monitor for hypocalcemia.  Future orders placed.   All questions encouraged and answered.   Instructed patient to call with any further questions or concerns.

## 2022-11-09 ENCOUNTER — Ambulatory Visit: Payer: Medicare Other | Attending: Internal Medicine | Admitting: Pharmacist

## 2022-11-09 DIAGNOSIS — M810 Age-related osteoporosis without current pathological fracture: Secondary | ICD-10-CM

## 2022-11-09 DIAGNOSIS — M25512 Pain in left shoulder: Secondary | ICD-10-CM | POA: Diagnosis not present

## 2022-11-09 DIAGNOSIS — M25511 Pain in right shoulder: Secondary | ICD-10-CM | POA: Diagnosis not present

## 2022-11-09 DIAGNOSIS — Z7689 Persons encountering health services in other specified circumstances: Secondary | ICD-10-CM

## 2022-11-09 MED ORDER — DENOSUMAB 60 MG/ML ~~LOC~~ SOSY
60.0000 mg | PREFILLED_SYRINGE | Freq: Once | SUBCUTANEOUS | Status: AC
  Administered 2022-11-09: 60 mg via SUBCUTANEOUS

## 2022-11-10 ENCOUNTER — Other Ambulatory Visit (HOSPITAL_COMMUNITY): Payer: Self-pay

## 2022-11-10 NOTE — Progress Notes (Signed)
Office Visit Note  Patient: Vanessa Harmon             Date of Birth: [birth year]           MRN: [id number]             PCP: Jarold Motto, PA Referring: Jarold Motto, PA Visit Date: 11/15/2022   Subjective:  Follow-up (Patient states she has not been taking the Rinvoq for atleast a month because it gave her respiratory symptoms. )   History of Present Illness: Vanessa Harmon is a 72 y.o. female here for follow up for seronegative RA on prednisone 5 mg daily and indomethacin 75 mg daily.  She stopped taking Rinvoq since about 1 month ago as she felt this was worsening her frequency of upper respiratory infection and needing inhaled bronchodilator and breathing treatments.  Was also having increased symptoms during this time due to seasonal allergies.  Since she stopped the Rinvoq feels this is improved although today is a bad day with persistent coughing.  Noticed small increase in joint pain of several areas including her shoulders and hips and knee.  She had repeat corticosteroid injection in both shoulders which has been improving her symptoms range of motion is better still pain with use.  Persistent bruising and skin rashes with a new avulsion on the left forearm but about the same as usual.  Labs reviewed 10/2022 WBC 11.9 Hgb 11.8 eGFR 47.27  DMARD Hx Methotrexate - LFT elevations Remicade- Rashes Actemra - Rashes Orencia IV - Secondary nonresponder Rinvoq - Respiratory symptoms worse   Previous HPI 09/21/2022 Vanessa Harmon is a 72 y.o. female here for follow up here for follow-up of seronegative RA on Rinvoq 15 mg p.o. daily and prednisone 5 mg daily.  The left elbow pain discussed at her last visit improved after steroid injection and has continued to do well.  Joint pain and inflammation doing pretty well overall chronic back pain is her biggest issue.  Continues having easy bruising and skin tears frequently from her 90 pound Rottweiler.  Recently sick with  sinus infection for the past week is currently on amoxicillin for this.  Not associated with any fevers lymphadenopathy or other constitutional symptoms.   Previous HPI 07/13/22 Vanessa Harmon is a 72 y.o. female here for follow up for seronegative RA on rinvoq 15 mg PO daily and prednisone 5 mg daily.  Since her last visit she finished getting over the persistent sinus infection symptoms after recent COVID illness.  However once the symptoms cleared up she started noticing increase in hip bursitis pain.  Also started getting worsening pain along the medial side of the left elbow.  Most noticeable when using her hands to help her self push off from a seated position due to her hip pain.  Not noticed any visible swelling or erythema she still has bruising around the elbow but that is more chronic and there are multiple skin tears in the area as well.  Does not feel like she is having flareup of increased joint symptoms outside of the hip and elbow pains.  She previously tried use of the elbow brace which we discussed before for her medial epicondylitis but just gets irritation around the skin and elbow does not feel much benefit.   Previous HPI 06/16/22 Vanessa Harmon is a 72 y.o. female here for follow up for seronegative RA on prednisone 5 mg daily and rinvoq 15 mg daily started since 2/18. Also prolia for  osteoporosis last injection on 1/24 without complication. Main issue has been sickness with COVID positive test 2/8 and persistent symptoms. She had subsequent sinus infection treated with augmentin starting 2/21 and had repeat labs checked yesterday. Brain fog, fatigue worse during COVID infection and partially improved since. She thinks rinvoq is starting to help with swelling and pain in hands and wrists but still significant overall symptoms. Has some skin tears and bruising on arms from her dog.   Previous HPI 03/16/22 Vanessa Harmon is a 72 y.o. female here for follow up for seropositive RA  on prednisone 5 mg daily. Methotrexate was discontinued due to worsening laboratory function tests and Actemra stopped due to developing palpable rashes throughout her legs similar to previous medication reaction to infliximab. She is also noticed easier bruising than usual and has seen discoloration of her nails with some separation and cracking.  Prednisone is helping somewhat but still has ongoing joint pain and stiffness specially off of her previous medications.  In particular bothering her lately has been persistent pain along the medial side of her left elbow does not recall any specific injury to this area but does feel like there is a sense of swelling and pressure.   Previous HPI 12/14/21 Vanessa Harmon is a 72 y.o. female here for follow up for seropositive RA on methotrexate 15 mg subcu weekly prednisone 5 mg daily leucovorin 1 mg daily and after starting Actemra 162 mg [place] q. 14 days.  She feels like arthritis symptoms have been well controlled on her current medications.  No significant flareups.  She still gets swelling in her wrist but no other visible joint swelling.  She has had pretty extensive bruising and easy skin tearing on both arms often from playing with her dog.  Lab tests checked in July did show appears to be a small decrease in estimated GFR.   Previous HPI 09/10/2021 Vanessa Harmon is a 72 y.o. female here for evaluation of chronic back pain associated with seropositive RA she has been seeing Azucena Fallen on treatment with orencia IV, MTX 15 mg [place] weekly, leucovorin 1 mg daily, and prednisone 5 mg daily.  She was originally diagnosed with inflammatory arthritis by Dr. Dareen Piano about a decade ago.  Apparently the worst initial symptom at that time was left hip pain and inflammation with concerning findings on MRI.  Was started on methotrexate as a steroid sparing DMARD treatment.  Apparently had initial concern for seronegative rheumatoid arthritis versus PMR presentation.   She did not tolerate methotrexate well initially worst side effect was substantial hair loss.  Also did not tolerate well on leflunomide.  She was started on infliximab treatment developed a significant adverse reaction with new skin rashes in multiple areas and severe finger and toe nail changes that lasted about a year after discontinuing the medication.  She was started on IV Orencia monthly infusions which has been beneficial for symptoms but still notices some amount ongoing and that symptoms tend to worsen between each dosing interval.  She was restarted on methotrexate this time with leucovorin in the past few months and this has improved her joint inflammation further.  She remains on low-dose prednisone 5 mg daily throughout most of this time.  Most recent definite flareup of her rheumatoid arthritis was about 6 months ago. In addition to inflammatory joint disease she has significant amount of generalized osteoarthritis.  She reports bone-on-bone degenerative changes with slightly impaired range of motion in the left shoulder.  She had previous left knee total arthroplasty.  She has degenerative arthritis at multiple levels in the spine with history of repeat lumbar spine surgery.  She has some spinal and foraminal stenosis with radicular symptoms.  She is also had recurrent problems with hip bursitis has been responsive to local steroid injection treatments with Dr. Charlann Boxer.  She takes gabapentin 300 mg at night and as needed Robaxin 500 mg for the ongoing symptoms. She has not had any history with recurrent infections or major side effects on her current regimen.  She does have extensive bruising mostly worst in the past year and worst on extensor surfaces of arms and legs.  She denies any history of abnormal bleeding and blood clots. She has chronic dry eyes and sees Dr. Dione Booze currently uses xiidra and genteal drops for this.   Review of Systems  Constitutional:  Positive for fatigue.  HENT:   Positive for mouth dryness. Negative for mouth sores.   Eyes:  Positive for dryness.  Respiratory:  Negative for shortness of breath.   Cardiovascular:  Negative for chest pain and palpitations.  Gastrointestinal:  Positive for constipation. Negative for blood in stool and diarrhea.  Endocrine: Negative for increased urination.  Genitourinary:  Negative for involuntary urination.  Musculoskeletal:  Positive for joint pain, joint pain, joint swelling, myalgias, muscle weakness, morning stiffness, muscle tenderness and myalgias. Negative for gait problem.  Skin:  Positive for color change. Negative for rash, hair loss and sensitivity to sunlight.  Allergic/Immunologic: Positive for susceptible to infections.  Neurological:  Negative for dizziness and headaches.  Hematological:  Negative for swollen glands.  Psychiatric/Behavioral:  Positive for depressed mood and sleep disturbance. The patient is nervous/anxious.     PMFS History:  Patient Active Problem List   Diagnosis Date Noted   Age-related osteoporosis without current pathological fracture 06/16/2022   Medial epicondylitis of left elbow 03/16/2022   High risk medication use 09/10/2021   Posterior vitreous detachment of left eye 09/29/2020   Thoracic spondylosis with cord compression 05/29/2020   Heterotopic ossification 03/30/2020   Loosening of hardware in spine (HCC) 01/27/2020   Posterior vitreous detachment of right eye 01/21/2020   Pseudophakia 01/21/2020   Horseshoe retinal tear of right eye 01/21/2020   Body mass index (BMI) 28.0-28.9, adult 11/27/2019   Left knee OA 11/26/2019   Numbness of foot 02/07/2019   Seronegative rheumatoid arthritis (HCC) 01/25/2019   Peripheral polyneuropathy 01/14/2019   Spinal stenosis, thoracolumbar region 01/14/2019   Thoracogenic scoliosis, thoracolumbar region 01/14/2019   Idiopathic scoliosis of thoracolumbar region 02/15/2018   Rhinitis, chronic 05/16/2017   Moderate persistent  asthma without complication 05/04/2017   History of polymyalgia rheumatica 02/15/2015   DJD (degenerative joint disease) 04/15/2014   Lumbar spine scoliosis 11/28/2013   Degenerative spondylolisthesis 10/09/2013   Lumbar radiculopathy 09/16/2013   Lumbosacral radiculitis 07/31/2013   Scoliosis, or kyphoscoliosis, idiopathic 07/31/2013   Lumbar back pain 12/19/2011   Hyperlipidemia 10/16/2010   Depression 10/16/2010   Lumbar spondylosis 10/16/2010   Gastroesophageal reflux disease 10/16/2010   Adenomatous polyp of colon 10/16/2010   History of paroxysmal supraventricular tachycardia 10/16/2010   Essential (primary) hypertension 10/16/2010    Past Medical History:  Diagnosis Date   Adenomatous polyp    Allergy    Anxiety    takes Ativan daily as needed   Arthritis    inflammatory arthritis (Dr. Verdie Shire)   Asthma    Cataract    Chronic back pain    scoliosis  Constipation    takes miralax every other day   COVID 05/19/2022   Depression    takes Lexapro daily   Eczema    Essential hypertension, benign    takes Hyzaar and Metoprolol daily   Family history of adverse reaction to anesthesia    Sister PONV   Fibromyalgia    GERD (gastroesophageal reflux disease)    History of colon polyps    History of shingles    Hyperlipidemia    takes Atorvastatin daily   Insomnia    takes Melatonin nightly   Joint pain    Osteoarthritis    Osteoporosis    PSVT (paroxysmal supraventricular tachycardia)    Recurrent upper respiratory infection (URI)    Rheumatoid arthritis (HCC)    sees Dr. Dierdre Forth; stable on Orencia   Rhinitis, allergic    uses Flonase daily   Urinary urgency    Urticaria     Family History  Problem Relation Age of Onset   Atrial fibrillation Mother    Stroke Mother    Allergic rhinitis Mother    Allergic rhinitis Father    AAA (abdominal aortic aneurysm) Father    COPD Father    Prostate cancer Father    Atrial fibrillation Brother     Hypertension Other        unspecified grandmother   AAA (abdominal aortic aneurysm) Other        unspecified grandfather   Cancer Other        unspecified grandfather   Neuropathy Neg Hx    Past Surgical History:  Procedure Laterality Date   ADENOIDECTOMY     APPENDECTOMY     BACK SURGERY  2012   at age 39 d/t scoliosis   BLEPHAROPLASTY  2018   BREAST BIOPSY     cataract surgery     COLONOSCOPY     HARDWARE REMOVAL Left 02/24/2020   Procedure: HARDWARE REMOVAL LEFT DISTAL RADIUS;  Surgeon: Betha Loa, MD;  Location: [facility];  Service: Orthopedics;  Laterality: Left;   KNEE ARTHROSCOPY Bilateral    neuroma removed from foot     unsure of which foot   OPEN REDUCTION INTERNAL FIXATION (ORIF) DISTAL RADIAL FRACTURE Left 08/15/2019   Procedure: OPEN REDUCTION INTERNAL FIXATION (ORIF) DISTAL RADIAL FRACTURE;  Surgeon: Betha Loa, MD;  Location: [facility];  Service: Orthopedics;  Laterality: Left;  block in preop   POSTERIOR CERVICAL FUSION/FORAMINOTOMY N/A 05/29/2020   Procedure: Removal of hardware, Replacement of thoracic 8, thoracic 9, thoracic 10 screws, Right Thoracic 6-7, Right Thoracic 7-8 Laminectomies with thoracic hooks from Thoracic 4-5 to Thoracic 10;  Surgeon: Maeola Harman, MD;  Location: [facility];  Service: Neurosurgery;  Laterality: N/A;   POSTERIOR LUMBAR FUSION 4 LEVEL N/A 02/15/2018   Procedure: Decompression and fusion Thoracic nine to Lumbar two with exploration of previous fusion;  Surgeon: Maeola Harman, MD;  Location: [facility] OR;  Service: Neurosurgery;  Laterality: N/A;   SHOULDER SURGERY Right    TONSILLECTOMY     TOTAL HIP ARTHROPLASTY Right 04/15/2014   Procedure: RIGHT TOTAL HIP ARTHROPLASTY ANTERIOR APPROACH;  Surgeon: Sheral Apley, MD;  Location: [facility] OR;  Service: Orthopedics;  Laterality: Right;   TOTAL KNEE ARTHROPLASTY Left 11/26/2019   Procedure: TOTAL KNEE ARTHROPLASTY;  Surgeon: Durene Romans, MD;  Location: [facility];   Service: Orthopedics;  Laterality: Left;  70 mins   Social History   Social History Narrative   Retired Engineer, civil (consulting)   No children  Lives with husband   Immunization History  Administered Date(s) Administered   Influenza Split 01/31/2012   Influenza Whole 02/16/2010   Influenza, High Dose Seasonal PF 12/13/2018, 02/18/2022   Influenza,inj,Quad PF,6+ Mos 01/14/2014, 01/27/2015, 02/02/2016   Influenza,inj,quad, With Preservative 02/26/2020   Influenza-Unspecified 02/24/2021   Moderna Sars-Covid-2 Vaccination 02/20/2022   PFIZER(Purple Top)SARS-COV-2 Vaccination 05/25/2019, 06/15/2019, 02/05/2020, 08/03/2020   Pneumococcal Conjugate-13 01/27/2015   Pneumococcal Polysaccharide-23 05/26/2017   Tdap 10/04/2011   Zoster Recombinant(Shingrix) 10/13/2017, 01/02/2018     Objective: Vital Signs: BP 135/78 (BP Location: Left Arm, Patient Position: Sitting, Cuff Size: Normal)   Pulse 97   Resp 16   Ht 5' 6.5" (1.689 m)   Wt 178 lb (80.7 kg)   BMI 28.30 kg/m    Physical Exam Eyes:     Conjunctiva/sclera: Conjunctivae normal.  Cardiovascular:     Rate and Rhythm: Normal rate and regular rhythm.  Pulmonary:     Effort: Pulmonary effort is normal.     Breath sounds: Normal breath sounds.  Lymphadenopathy:     Cervical: No cervical adenopathy.  Skin:    General: Skin is warm and dry.     Findings: Bruising present.     Comments: Senile purpura on both hands and forearms, large patch of skin avulsion with bandage in place on left forearm  Neurological:     Mental Status: She is alert.  Psychiatric:        Mood and Affect: Mood normal.      Musculoskeletal Exam:  Shoulders full ROM mild lateral tenderness to pressure, no palpable swelling Elbows full ROM no tenderness or swelling Wrists full ROM no tenderness or swelling Squaring of first CMC joints on both hands with tenderness to pressure no overlying swelling, right hand MCP joints are tender bony nodules and widening present no  subluxation or lateral deviation Knees full ROM left knee crepitus and mild joint line tenderness to pressure no palpable effusions Ankles full ROM no tenderness or swelling   CDAI Exam: CDAI Score: 14  Patient Global: 50 / 100; Provider Global: 20 / 100 Swollen: 0 ; Tender: 9  Joint Exam 11/15/2022      Right  Left  Glenohumeral   Tender   Tender  CMC   Tender   Tender  MCP 2   Tender     MCP 3   Tender     MCP 4   Tender     MCP 5   Tender     Knee      Tender     Investigation: No additional findings.  Imaging: No results found.  Recent Labs: Lab Results  Component Value Date   WBC 11.9 (H) 10/26/2022   HGB 11.8 (L) 10/26/2022   PLT 360.0 10/26/2022   NA 141 10/26/2022   K 4.5 10/26/2022   CL 101 10/26/2022   CO2 29 10/26/2022   GLUCOSE 119 (H) 10/26/2022   BUN 18 10/26/2022   CREATININE 1.15 10/26/2022   BILITOT 0.6 10/26/2022   ALKPHOS 36 (L) 10/26/2022   AST 17 10/26/2022   ALT 17 10/26/2022   PROT 6.4 10/26/2022   ALBUMIN 4.2 10/26/2022   CALCIUM 10.3 10/26/2022   GFRAA >60 11/27/2019   QFTBGOLDPLUS NEGATIVE 09/10/2021    Speciality Comments: No specialty comments available.  Procedures:  No procedures performed Allergies: Advair hfa [fluticasone-salmeterol], Statins, Breo ellipta [fluticasone furoate-vilanterol], Fosamax [alendronate sodium], Levaquin [levofloxacin], Infliximab, Spiriva handihaler [tiotropium bromide monohydrate], Verapamil, and Tape   Assessment / Plan:  Visit Diagnoses: Seronegative rheumatoid arthritis (HCC)  Currently doing worse with joint pain in multiple areas though no objective synovitis appreciable to joint exam.  Currently just on prednisone after stopping the Rinvoq that she thinks was worsening her asthma symptoms and frequency of upper respiratory infection.  We have tried numerous medication classes already each with side effects or loss of efficacy.  I think we can revisit TNF inhibitor to which she previously  had good response but drug side effect while on Remicade infusion this was years ago.  Plan to switch to Enbrel 50 mg subcu weekly.  High risk medication use   Now off the Rinvoq just on low-dose prednisone.  But recent labs reviewed from primary care office on July 10 very slight anemia with hemoglobin 11.8 with MCV of 100.4.  Metabolic panel was okay.  Provided printed order for collection along with [facility] labs at PCP office scheduled tomorrow. Discussed risk of alternative treatment options concerning Enbrel.  Previously had good response to Remicade with infusion related reaction which should not be a shared risk for Enbrel.  Otherwise reviewed risk of injection reactions, cytopenias, infections, or malignancy including lymphoma or nonmelanoma skin cancer.  Age-related osteoporosis without current pathological fracture -  On Prolia had last dose a week ago July 24.  On maintenance vitamin D supplementation.  Plan for repeat bone density monitoring next July 2025.   Orders: Orders Placed This Encounter  Procedures   QuantiFERON-TB Gold Plus   No orders of the defined types were placed in this encounter.    Follow-Up Instructions: Return in about 3 months (around 02/15/2023) for RA on GC/?ENB switch f/u 3mos.   Fuller Plan, MD  Note - This record has been created using [employer].  Chart creation errors have been sought, but may not always  have been located. Such creation errors do not reflect on  the standard of medical care.

## 2022-11-15 ENCOUNTER — Telehealth: Payer: Self-pay | Admitting: Pharmacist

## 2022-11-15 ENCOUNTER — Ambulatory Visit: Payer: Medicare Other | Attending: Internal Medicine | Admitting: Internal Medicine

## 2022-11-15 ENCOUNTER — Encounter: Payer: Self-pay | Admitting: Internal Medicine

## 2022-11-15 VITALS — BP 135/78 | HR 97 | Resp 16 | Ht 66.5 in | Wt 178.0 lb

## 2022-11-15 DIAGNOSIS — M0600 Rheumatoid arthritis without rheumatoid factor, unspecified site: Secondary | ICD-10-CM

## 2022-11-15 DIAGNOSIS — M4310 Spondylolisthesis, site unspecified: Secondary | ICD-10-CM

## 2022-11-15 DIAGNOSIS — Z7952 Long term (current) use of systemic steroids: Secondary | ICD-10-CM | POA: Diagnosis not present

## 2022-11-15 DIAGNOSIS — M810 Age-related osteoporosis without current pathological fracture: Secondary | ICD-10-CM | POA: Diagnosis not present

## 2022-11-15 DIAGNOSIS — Z79899 Other long term (current) drug therapy: Secondary | ICD-10-CM

## 2022-11-15 NOTE — Patient Instructions (Signed)
Etanercept Injection What is this medication? ETANERCEPT (et a [employer]) treats autoimmune conditions, such as psoriasis and certain types of arthritis. It works by slowing down an overactive immune system. It belongs to a group of medications called TNF inhibitors. This medicine may be used for other purposes; ask your health care provider or pharmacist if you have questions. COMMON BRAND NAME(S): Enbrel What should I tell my care team before I take this medication? They need to know if you have any of these conditions: Bleeding disorder Cancer Diabetes Granulomatosis with polyangiitis Heart failure HIV or AIDS Immune system problems Infection, such as tuberculosis (TB) or other bacterial, fungal or viral infections Liver disease Nervous system problems, such as Guillain-Barre syndrome, multiple sclerosis or seizures Recent or upcoming vaccine An unusual or allergic reaction to etanercept, other medications, food, dyes, or preservatives Pregnant or trying to get pregnant Breastfeeding How should I use this medication? The medication is injected under the skin. You will be taught how to prepare and give it. Take it as directed on the prescription label. Keep taking it unless your care team tells you stop. This medication comes with INSTRUCTIONS FOR USE. Ask your pharmacist for directions on how to use this medication. Read the information carefully. Talk to your pharmacist or care team if you have questions. If you use a pen, be sure to take off the outer needle cover before using the dose. It is important that you put your used needles and syringes in a special sharps container. Do not put them in a trash can. If you do not have a sharps container, call your pharmacist or care team to get one. A special MedGuide will be given to you by the pharmacist with each prescription and refill. Be sure to read this information carefully each time. Talk to your care team about the use of this  medication in children. While it may be prescribed for children as young as 60 years of age for selected conditions, precautions do apply. Overdosage: If you think you have taken too much of this medicine contact a poison control center or emergency room at once. NOTE: This medicine is only for you. Do not share this medicine with others. What if I miss a dose? If you miss a dose, take it as soon as you can. If it is almost time for your next dose, take only that dose. Do not take double or extra doses. What may interact with this medication? Do not take this medication with any of the following: Biologic medications, such as adalimumab, certolizumab, golimumab, infliximab Live vaccines Rilonacept This medication may also interact with the following: Abatacept Anakinra Biologic medications, such as anifrolumab, baricitinib, belimumab, canakinumab, natalizumab, rituximab, sarilumab, tocilizumab, tofacitinib, upadacitinib, vedolizumab Cyclophosphamide Sulfasalazine This list may not describe all possible interactions. Give your health care provider a list of all the medicines, herbs, non-prescription drugs, or dietary supplements you use. Also tell them if you smoke, drink alcohol, or use illegal drugs. Some items may interact with your medicine. What should I watch for while using this medication? Visit your care team for regular checks on your progress. Tell your care team if your symptoms do not start to get better or if they get worse. This medication may increase your risk of getting an infection. Call your care team for advice if you get a fever, chills, sore throat, or other symptoms of a cold or flu. Do not treat yourself. Try to avoid being around people who are sick. If  you have not had the measles or chickenpox vaccines, tell your care team right away if you are around someone with these viruses. You will be tested for tuberculosis (TB) before you start this medication. If your care team  prescribes any medication for TB, you should start taking the TB medication before starting this medication. Make sure to finish the full course of TB medication. Avoid taking medications that contain aspirin, acetaminophen, ibuprofen, naproxen, or ketoprofen unless instructed by your care team. These medications may hide fever. Talk to your care team about your risk of cancer. You may be more at risk for certain types of cancer if you take this medication. This medication can decrease the response to a vaccine. If you need to get vaccinated, tell your care team if you have received this medication. Extra booster doses may be needed. Talk to your care team to see if a different vaccination schedule is needed. What side effects may I notice from receiving this medication? Side effects that you should report to your care team as soon as possible: Allergic reactions--skin rash, itching, hives, swelling of the face, lips, tongue, or throat Body pain, tingling, or numbness Eye pain, change in vision, vision loss Heart failure--shortness of breath, swelling of the ankles, feet, or hands, sudden weight gain, unusual weakness or fatigue Infection--fever, chills, cough, sore throat, wounds that don't heal, pain or trouble when passing urine, general feeling of discomfort or being unwell Liver injury--right upper belly pain, loss of appetite, nausea, light-colored stool, dark yellow or brown urine, yellowing skin or eyes, unusual weakness or fatigue Low red blood cell level--unusual weakness or fatigue, dizziness, headache, trouble breathing Lupus-like syndrome--joint pain, swelling, or stiffness, butterfly-shaped rash on the face, rashes that get worse in the sun, fever, unusual weakness or fatigue New or worsening psoriasis--rash with itchy, scaly patches Seizures Unusual bruising or bleeding Weakness in arms and legs Side effects that usually do not require medical attention (report to your care team if  they continue or are bothersome): Headache Pain, redness, or irritation at injection site Sinus pain or pressure around the face or forehead This list may not describe all possible side effects. Call your doctor for medical advice about side effects. You may report side effects to FDA at 1-800-FDA-1088. Where should I keep my medication? Keep out of the reach of children and pets. See product for storage information. Each product may have different instructions. Get rid of any unused medication after the expiration date. To get rid of medications that are no longer needed or have expired: Take the medication to a medication take-back program. Check with your pharmacy or law enforcement to find a location. If you cannot return the medication, ask your pharmacist or care team how to get rid of this medication safely. NOTE: This sheet is a summary. It may not cover all possible information. If you have questions about this medicine, talk to your doctor, pharmacist, or health care provider.  2024 Elsevier/Gold Standard (2021-09-29 00:00:00)

## 2022-11-15 NOTE — Telephone Encounter (Signed)
Pending OV note from today, please start Enbrel PA  Dose: 50mg  SQ every 7 days Dx: RA  Chesley Mires, PharmD, MPH, BCPS, CPP Clinical Pharmacist (Rheumatology and Pulmonology)

## 2022-11-16 ENCOUNTER — Other Ambulatory Visit: Payer: Medicare Other

## 2022-11-16 DIAGNOSIS — M1612 Unilateral primary osteoarthritis, left hip: Secondary | ICD-10-CM | POA: Diagnosis not present

## 2022-11-16 DIAGNOSIS — R710 Precipitous drop in hematocrit: Secondary | ICD-10-CM

## 2022-11-16 DIAGNOSIS — M7062 Trochanteric bursitis, left hip: Secondary | ICD-10-CM | POA: Diagnosis not present

## 2022-11-16 DIAGNOSIS — M25551 Pain in right hip: Secondary | ICD-10-CM | POA: Diagnosis not present

## 2022-11-16 DIAGNOSIS — Z96641 Presence of right artificial hip joint: Secondary | ICD-10-CM | POA: Diagnosis not present

## 2022-11-16 DIAGNOSIS — M25552 Pain in left hip: Secondary | ICD-10-CM | POA: Diagnosis not present

## 2022-11-16 LAB — CBC WITH DIFFERENTIAL/PLATELET
Basophils Absolute: 0.1 10*3/uL (ref 0.0–0.1)
Basophils Relative: 0.7 % (ref 0.0–3.0)
Eosinophils Absolute: 0 10*3/uL (ref 0.0–0.7)
Eosinophils Relative: 0.5 % (ref 0.0–5.0)
HCT: 39.1 % (ref 36.0–46.0)
Hemoglobin: 12.6 g/dL (ref 12.0–15.0)
Lymphocytes Relative: 9.6 % — ABNORMAL LOW (ref 12.0–46.0)
Lymphs Abs: 1 10*3/uL (ref 0.7–4.0)
MCHC: 32.3 g/dL (ref 30.0–36.0)
MCV: 100.2 fl — ABNORMAL HIGH (ref 78.0–100.0)
Monocytes Absolute: 0.8 10*3/uL (ref 0.1–1.0)
Monocytes Relative: 7.3 % (ref 3.0–12.0)
Neutro Abs: 8.6 10*3/uL — ABNORMAL HIGH (ref 1.4–7.7)
Neutrophils Relative %: 81.9 % — ABNORMAL HIGH (ref 43.0–77.0)
Platelets: 314 10*3/uL (ref 150.0–400.0)
RBC: 3.9 Mil/uL (ref 3.87–5.11)
RDW: 14 % (ref 11.5–15.5)
WBC: 10.5 10*3/uL (ref 4.0–10.5)

## 2022-11-16 LAB — IBC + FERRITIN
Ferritin: 37 ng/mL (ref 10.0–291.0)
Iron: 107 ug/dL (ref 42–145)
Saturation Ratios: 29.4 % (ref 20.0–50.0)
TIBC: 364 ug/dL (ref 250.0–450.0)
Transferrin: 260 mg/dL (ref 212.0–360.0)

## 2022-11-17 ENCOUNTER — Other Ambulatory Visit (HOSPITAL_COMMUNITY): Payer: Self-pay

## 2022-11-17 NOTE — Telephone Encounter (Signed)
Received notification from [facility] regarding a prior authorization for ENBREL. Authorization has been APPROVED from 11/17/22 to 05/20/2023. Approval letter sent to scan center.  Per test claim, copay for 28 days supply is $0  Patient can fill through [facility]: [phone]   Authorization # [id number] Phone # [phone]  Chesley Mires, PharmD, MPH, BCPS, CPP Clinical Pharmacist (Rheumatology and Pulmonology)

## 2022-11-17 NOTE — Telephone Encounter (Signed)
Submitted a Prior Authorization request to [facility] for ENBREL via CoverMyMeds. Will update once we receive a response.  Key: [id number]  Chesley Mires, PharmD, MPH, BCPS, CPP Clinical Pharmacist (Rheumatology and Pulmonology)

## 2022-11-18 ENCOUNTER — Other Ambulatory Visit: Payer: Self-pay

## 2022-11-18 ENCOUNTER — Other Ambulatory Visit (HOSPITAL_COMMUNITY): Payer: Self-pay

## 2022-11-18 ENCOUNTER — Ambulatory Visit
Admission: EM | Admit: 2022-11-18 | Discharge: 2022-11-18 | Disposition: A | Discharge status: Home / Self Care | Payer: Medicare Other | Attending: Internal Medicine | Admitting: Internal Medicine

## 2022-11-18 ENCOUNTER — Ambulatory Visit (INDEPENDENT_AMBULATORY_CARE_PROVIDER_SITE_OTHER): Payer: Medicare Other

## 2022-11-18 DIAGNOSIS — W540XXA Bitten by dog, initial encounter: Secondary | ICD-10-CM | POA: Diagnosis not present

## 2022-11-18 DIAGNOSIS — S52611A Displaced fracture of right ulna styloid process, initial encounter for closed fracture: Secondary | ICD-10-CM | POA: Diagnosis not present

## 2022-11-18 DIAGNOSIS — M19041 Primary osteoarthritis, right hand: Secondary | ICD-10-CM | POA: Diagnosis not present

## 2022-11-18 DIAGNOSIS — M1811 Unilateral primary osteoarthritis of first carpometacarpal joint, right hand: Secondary | ICD-10-CM | POA: Diagnosis not present

## 2022-11-18 DIAGNOSIS — S61210A Laceration without foreign body of right index finger without damage to nail, initial encounter: Secondary | ICD-10-CM | POA: Diagnosis not present

## 2022-11-18 DIAGNOSIS — S61451A Open bite of right hand, initial encounter: Secondary | ICD-10-CM | POA: Diagnosis not present

## 2022-11-18 MED ORDER — AMOXICILLIN-POT CLAVULANATE 875-125 MG PO TABS: 1.0000 | ORAL_TABLET | Freq: Two times a day (BID) | ORAL | 0 refills | Status: DC

## 2022-11-18 MED ORDER — MUPIROCIN 2 % EX OINT: 1.0000 | TOPICAL_OINTMENT | Freq: Two times a day (BID) | CUTANEOUS | 0 refills | Status: DC

## 2022-11-18 MED ORDER — ENBREL SURECLICK 50 MG/ML ~~LOC~~ SOAJ
50.0000 mg | SUBCUTANEOUS | 0 refills | Status: DC
Start: 2022-11-18 — End: 2023-02-09
  Filled 2022-11-18: qty 12, 84d supply, fill #0
  Filled 2022-11-18: qty 4, 28d supply, fill #0
  Filled 2022-12-09: qty 4, 28d supply, fill #1
  Filled 2023-01-11 – 2023-01-18 (×2): qty 4, 28d supply, fill #2

## 2022-11-18 NOTE — ED Provider Notes (Signed)
[facility]    CSN: [id number] Arrival date & time: 11/18/22  1906      History   Chief Complaint Chief Complaint  Patient presents with   Animal Bite         HPI Vanessa Harmon is a 72 y.o. female.   6pm tonoight wound happened Was trying to et a string cheese wrapper from dog  Dog is pet and fully vaccinated against rabies      Past Medical History:  Diagnosis Date   Adenomatous polyp    Allergy    Anxiety    takes Ativan daily as needed   Arthritis    inflammatory arthritis (Dr. Verdie Shire)   Asthma    Cataract    Chronic back pain    scoliosis    Constipation    takes miralax every other day   COVID 05/19/2022   Depression    takes Lexapro daily   Eczema    Essential hypertension, benign    takes Hyzaar and Metoprolol daily   Family history of adverse reaction to anesthesia    Sister PONV   Fibromyalgia    GERD (gastroesophageal reflux disease)    History of colon polyps    History of shingles    Hyperlipidemia    takes Atorvastatin daily   Insomnia    takes Melatonin nightly   Joint pain    Osteoarthritis    Osteoporosis    PSVT (paroxysmal supraventricular tachycardia)    Recurrent upper respiratory infection (URI)    Rheumatoid arthritis (HCC)    sees Dr. Dierdre Forth; stable on Orencia   Rhinitis, allergic    uses Flonase daily   Urinary urgency    Urticaria     Patient Active Problem List   Diagnosis Date Noted   Age-related osteoporosis without current pathological fracture 06/16/2022   Medial epicondylitis of left elbow 03/16/2022   High risk medication use 09/10/2021   Posterior vitreous detachment of left eye 09/29/2020   Thoracic spondylosis with cord compression 05/29/2020   Heterotopic ossification 03/30/2020   Loosening of hardware in spine (HCC) 01/27/2020   Posterior vitreous detachment of right eye 01/21/2020   Pseudophakia 01/21/2020   Horseshoe retinal tear of right eye 01/21/2020   Body mass  index (BMI) 28.0-28.9, adult 11/27/2019   Left knee OA 11/26/2019   Numbness of foot 02/07/2019   Seronegative rheumatoid arthritis (HCC) 01/25/2019   Peripheral polyneuropathy 01/14/2019   Spinal stenosis, thoracolumbar region 01/14/2019   Thoracogenic scoliosis, thoracolumbar region 01/14/2019   Idiopathic scoliosis of thoracolumbar region 02/15/2018   Rhinitis, chronic 05/16/2017   Moderate persistent asthma without complication 05/04/2017   History of polymyalgia rheumatica 02/15/2015   DJD (degenerative joint disease) 04/15/2014   Lumbar spine scoliosis 11/28/2013   Degenerative spondylolisthesis 10/09/2013   Lumbar radiculopathy 09/16/2013   Lumbosacral radiculitis 07/31/2013   Scoliosis, or kyphoscoliosis, idiopathic 07/31/2013   Lumbar back pain 12/19/2011   Hyperlipidemia 10/16/2010   Depression 10/16/2010   Lumbar spondylosis 10/16/2010   Gastroesophageal reflux disease 10/16/2010   Adenomatous polyp of colon 10/16/2010   History of paroxysmal supraventricular tachycardia 10/16/2010   Essential (primary) hypertension 10/16/2010    Past Surgical History:  Procedure Laterality Date   ADENOIDECTOMY     APPENDECTOMY     BACK SURGERY  2012   at age 17 d/t scoliosis   BLEPHAROPLASTY  2018   BREAST BIOPSY     cataract surgery     COLONOSCOPY  HARDWARE REMOVAL Left 02/24/2020   Procedure: HARDWARE REMOVAL LEFT DISTAL RADIUS;  Surgeon: Betha Loa, MD;  Location: [facility];  Service: Orthopedics;  Laterality: Left;   KNEE ARTHROSCOPY Bilateral    neuroma removed from foot     unsure of which foot   OPEN REDUCTION INTERNAL FIXATION (ORIF) DISTAL RADIAL FRACTURE Left 08/15/2019   Procedure: OPEN REDUCTION INTERNAL FIXATION (ORIF) DISTAL RADIAL FRACTURE;  Surgeon: Betha Loa, MD;  Location: [facility];  Service: Orthopedics;  Laterality: Left;  block in preop   POSTERIOR CERVICAL FUSION/FORAMINOTOMY N/A 05/29/2020   Procedure: Removal of  hardware, Replacement of thoracic 8, thoracic 9, thoracic 10 screws, Right Thoracic 6-7, Right Thoracic 7-8 Laminectomies with thoracic hooks from Thoracic 4-5 to Thoracic 10;  Surgeon: Maeola Harman, MD;  [facility];  Service: Neurosurgery;  Laterality: N/A;   POSTERIOR LUMBAR FUSION 4 LEVEL N/A 02/15/2018   Procedure: Decompression and fusion Thoracic nine to Lumbar two with exploration of previous fusion;  Surgeon: Maeola Harman, MD;  Location: [facility] OR;  Service: Neurosurgery;  Laterality: N/A;   SHOULDER SURGERY Right    TONSILLECTOMY     TOTAL HIP ARTHROPLASTY Right 04/15/2014   Procedure: RIGHT TOTAL HIP ARTHROPLASTY ANTERIOR APPROACH;  Surgeon: Sheral Apley, MD;  Location: [facility] OR;  Service: Orthopedics;  Laterality: Right;   TOTAL KNEE ARTHROPLASTY Left 11/26/2019   Procedure: TOTAL KNEE ARTHROPLASTY;  Surgeon: Durene Romans, MD;  Location: [facility];  Service: Orthopedics;  Laterality: Left;  70 mins    OB History   No obstetric history on file.      Home Medications    Prior to Admission medications   Medication Sig Start Date End Date Taking? Authorizing Provider  amoxicillin-clavulanate (AUGMENTIN) 875-125 MG tablet Take 1 tablet by mouth every 12 (twelve) hours. 11/18/22  Yes Carlisle Beers, FNP  mupirocin ointment (BACTROBAN) 2 % Apply 1 Application topically 2 (two) times daily. 11/18/22  Yes Carlisle Beers, FNP  albuterol (PROAIR HFA) 108 (90 Base) MCG/ACT inhaler Inhale two puffs every 4-6 hours if needed for cough or wheeze. 03/26/18   Kozlow, Alvira Philips, MD  amLODipine (NORVASC) 10 MG tablet TAKE ONE TABLET BY MOUTH DAILY 08/31/22   Jarold Motto, PA  aspirin EC 81 MG tablet Take 1 tablet (81 mg total) by mouth daily. Swallow whole. 06/05/20   Costella, Darci Current, PA-C  azelastine (ASTELIN) 0.1 % nasal spray Place 1 spray into both nostrils 2 (two) times daily. Use in each nostril as directed    [provider]  azithromycin (ZITHROMAX) 250 MG tablet Take  by mouth. 11/09/22 11/09/23  [provider]  Bempedoic Acid (NEXLETOL) 180 MG TABS Take 1 Dose by mouth daily. 01/07/22   Hilty, Lisette Abu, MD  betamethasone dipropionate 0.05 % cream SMARTSIG:1 sparingly Topical Twice Daily 01/06/22   [provider]  budesonide (PULMICORT) 0.5 MG/2ML nebulizer solution Take by nebulization. 01/31/22   [provider]  Calcium Carb-Cholecalciferol (CALCIUM 600/VITAMIN D3 PO) Take 1 tablet by mouth daily.    [provider]  carboxymethylcellulose (REFRESH PLUS) 0.5 % SOLN Place 1 drop into both eyes 3 (three) times daily as needed (dry eyes).     [provider]  Cetirizine HCl (ZYRTEC ALLERGY) 10 MG CAPS Zyrtec 10 mg capsule Patient not taking: Reported on 11/15/2022    [provider]  Cholecalciferol 125 MCG (5000 UT) TABS Vitamin D3 125 mcg (5,000 unit) tablet  Take 1 tablet every day  by oral route.    [provider]  cyanocobalamin (VITAMIN B12) 1000 MCG/ML injection Inject 1 mL (1,000 mcg total) into the muscle as needed. Inject 1 mL (1,000 mcg total) into thigh muscle once weekly for 3 weeks then once monthly for 3 months 07/07/22   Jarold Motto, PA  denosumab (PROLIA) 60 MG/ML SOSY injection Inject 60 mg into the skin every 6 (six) months. Courier to rheum: [address], [place] [address]. Appt on 11/09/22 11/03/22   Fuller Plan, MD  diphenhydrAMINE (BENADRYL) 25 MG tablet Take 50 mg by mouth daily as needed for allergies.    [provider]  docusate sodium (COLACE) 100 MG capsule 100 mg 2 (two) times daily.    [provider]  escitalopram (LEXAPRO) 20 MG tablet TAKE ONE TABLET BY MOUTH DAILY 11/03/22   Jarold Motto, PA  etanercept (ENBREL SURECLICK) 50 MG/ML injection Inject 50 mg into the skin once a week. 11/18/22   Rice, Jamesetta Orleans, MD  Evolocumab (REPATHA SURECLICK) 140 MG/ML SOAJ INJECT 1 DOSE INTO THE SKIN EVERY 14 (FOURTEEN) DAYS 08/19/22    Hilty, Lisette Abu, MD  ezetimibe (ZETIA) 10 MG tablet TAKE ONE TABLET BY MOUTH DAILY 10/17/22   Jarold Motto, PA  famotidine (PEPCID) 40 MG tablet TAKE 1 TABLET (40 MG TOTAL) BY MOUTH AT BEDTIME. 11/21/18   Kozlow, Alvira Philips, MD  furosemide (LASIX) 20 MG tablet TAKE ONE TABLET BY MOUTH DAILY 10/17/22   Jarold Motto, PA  gabapentin (NEURONTIN) 300 MG capsule Take 1 capsule (300 mg total) by mouth at bedtime. 10/25/21   Jarold Motto, PA  guaiFENesin (MUCINEX) 600 MG 12 hr tablet Take 600 mg by mouth 2 (two) times daily as needed for cough or to loosen phlegm.    [provider]  HYDROcodone-acetaminophen (NORCO) 10-325 MG tablet 1 tablet every 6 (six) hours as needed.    [provider]  indomethacin (INDOCIN SR) 75 MG CR capsule TAKE ONE CAPSULE BY MOUTH DAILY WITH BREAKFAST 10/26/22   Jarold Motto, PA  ipratropium (ATROVENT) 0.03 % nasal spray Place 1 spray into both nostrils 2 (two) times daily as needed for rhinitis.    [provider]  irbesartan (AVAPRO) 300 MG tablet TAKE ONE TABLET BY MOUTH DAILY 09/26/22   Jarold Motto, PA  Lifitegrast Benay Spice) 5 % SOLN Place 1 drop into both eyes in the morning and at bedtime.     [provider]  melatonin 3 MG TABS tablet Take by mouth. 05/16/17   [provider]  methocarbamol (ROBAXIN) 500 MG tablet Take 1 tablet (500 mg total) by mouth every 6 (six) hours as needed for muscle spasms. 05/31/20   Costella, Darci Current, PA-C  montelukast (SINGULAIR) 10 MG tablet Take 1 tablet (10 mg total) by mouth at bedtime. 06/19/18   Kozlow, Alvira Philips, MD  Multiple Vitamin (MULTIVITAMIN) capsule Take 1 capsule by mouth daily.    [provider]  omeprazole (PRILOSEC) 40 MG capsule Take 1 capsule (40 mg total) by mouth daily. TAKE ONE CAPSULE BY MOUTH DAILY BEFORE BREAKFAST DAILY 10/27/22   Jarold Motto, PA  OVER THE COUNTER MEDICATION Place 1 application into both eyes at bedtime. Genteal overnight eye ointment     [provider]  polyethylene glycol powder (GLYCOLAX/MIRALAX) 17 GM/SCOOP powder Miralax 17 gram/dose oral powder    [provider]  predniSONE (DELTASONE) 5 MG tablet TAKE 1 TABLET (5 MG TOTAL) BY MOUTH DAILY WITH BREAKFAST 10/26/22  Fuller Plan, MD  PRESCRIPTION MEDICATION Place 1 spray into the nose in the morning and at bedtime. Budesonide 0.5 mg / 2 mL Nasal rinse    [provider]  pseudoephedrine (SUDAFED) 30 MG tablet Take 30 mg by mouth every 4 (four) hours as needed for congestion.    [provider]  SYMBICORT 160-4.5 MCG/ACT inhaler Inhale 2 puffs into the lungs 2 (two) times daily.    [provider]  SYRINGE-NEEDLE, DISP, 3 ML (B-D 3CC LUER-LOK SYR 25GX1") 25G X 1" 3 ML MISC USE TO INJECT VITAMIN B12 07/19/22   Jarold Motto, PA  traZODone (DESYREL) 150 MG tablet TAKE ONE TABLET BY MOUTH DAILY 09/20/22   Jarold Motto, PA  valACYclovir (VALTREX) 1000 MG tablet Take 1,000 mg by mouth daily.  09/02/16   [provider]  vitamin C (ASCORBIC ACID) 500 MG tablet Take 500 mg by mouth daily.    [provider]    Family History Family History  Problem Relation Age of Onset   Atrial fibrillation Mother    Stroke Mother    Allergic rhinitis Mother    Allergic rhinitis Father    AAA (abdominal aortic aneurysm) Father    COPD Father    Prostate cancer Father    Atrial fibrillation Brother    Hypertension Other        unspecified grandmother   AAA (abdominal aortic aneurysm) Other        unspecified grandfather   Cancer Other        unspecified grandfather   Neuropathy Neg Hx     Social History Social History   Tobacco Use   Smoking status: Former    Current packs/day: 0.00    Average packs/day: 1 pack/day for 20.0 years (20.0 ttl pk-yrs)    Types: Cigarettes    Start date: 1    Quit date: 1998    Years since quitting: 26.6    Passive exposure: Never   Smokeless tobacco: Never  Vaping Use    Vaping status: Never Used  Substance Use Topics   Alcohol use: Yes    Comment: rarely with dinner   Drug use: No     Allergies   Advair hfa [fluticasone-salmeterol], Statins, Breo ellipta [fluticasone furoate-vilanterol], Fosamax [alendronate sodium], Levaquin [levofloxacin], Infliximab, Spiriva handihaler [tiotropium bromide monohydrate], Verapamil, and Tape   Review of Systems Review of Systems   Physical Exam Triage Vital Signs ED Triage Vitals  Encounter Vitals Group     BP 11/18/22 2011 (!) 157/93     Systolic BP Percentile --      Diastolic BP Percentile --      Pulse Rate 11/18/22 2011 98     Resp 11/18/22 2011 18     Temp 11/18/22 2011 98.1 F (36.7 C)     Temp Source 11/18/22 2011 Oral     SpO2 11/18/22 2011 98 %     Weight --      Height --      Head Circumference --      Peak Flow --      Pain Score 11/18/22 2019 2     Pain Loc --      Pain Education --      Exclude from Growth Chart --    No data found.  Updated Vital Signs BP (!) 157/93 (BP Location: Left Arm)   Pulse 98   Temp 98.1 F (36.7 C) (Oral)   Resp 18   SpO2 98%  Visual Acuity Right Eye Distance:   Left Eye Distance:   Bilateral Distance:    Right Eye Near:   Left Eye Near:    Bilateral Near:     Physical Exam   UC Treatments / Results  Labs (all labs ordered are listed, but only abnormal results are displayed) Labs Reviewed - No data to display  EKG   Radiology No results found.  Procedures Procedures (including critical care time)  Medications Ordered in UC Medications - No data to display  Initial Impression / Assessment and Plan / UC Course  I have reviewed the triage vital signs and the nursing notes.  Pertinent labs & imaging results that were available during my care of the patient were reviewed by me and considered in my medical decision making (see chart for details).     *** Final Clinical Impressions(s) / UC Diagnoses   Final diagnoses:  Dog  bite of right hand, initial encounter     Discharge Instructions      Take antibiotic as prescribed for 7 days to prevent infection to the bite wound. Mupirocin ointment twice a day for the next 7 days. Aquaphor as needed to the wound.   Cleanse the wound with warm soap and water every 12 hours, then cover with bandaid. Change bandaid twice a day while wound heals.   Watch for worsening signs of infection such as redness, swelling, warmth, or pain to the site.   If you develop any new or worsening symptoms or do not improve in the next 2 to 3 days, please return.  If your symptoms are severe, please go to the emergency room.  Follow-up with your primary care provider for further evaluation and management of your symptoms as well as ongoing wellness visits.  I hope you feel better!   ED Prescriptions     Medication Sig Dispense Auth. Provider   amoxicillin-clavulanate (AUGMENTIN) 875-125 MG tablet Take 1 tablet by mouth every 12 (twelve) hours. 14 tablet Carlisle Beers, FNP   mupirocin ointment (BACTROBAN) 2 % Apply 1 Application topically 2 (two) times daily. 22 g Carlisle Beers, FNP      PDMP not reviewed this encounter.

## 2022-11-18 NOTE — Telephone Encounter (Signed)
Delivery instructions have been updated in [place], medication will be shipped to patient's home address on 11/21/22.  Rx has been processed in [facility] and the patient has no copay at this time.

## 2022-11-18 NOTE — Telephone Encounter (Signed)
Spoke with patient and confirmed shipping address that is on file is correct for shipping. She confirms she will be home all of next week to receive medication. Rx for Enbrel Sureclick 50mg  SQ every 7 days sent to [facility].   Routing to [place] for onboarding needs.  Will not need new start visit per Dr. Dimple Casey.  Chesley Mires, PharmD, MPH, BCPS, CPP Clinical Pharmacist (Rheumatology and Pulmonology)

## 2022-11-18 NOTE — Discharge Instructions (Signed)
Take antibiotic as prescribed for 7 days to prevent infection to the bite wound. Mupirocin ointment twice a day for the next 7 days. Aquaphor as needed to the wound.   Cleanse the wound with warm soap and water every 12 hours, then cover with bandaid. Change bandaid twice a day while wound heals.   Watch for worsening signs of infection such as redness, swelling, warmth, or pain to the site.   If you develop any new or worsening symptoms or do not improve in the next 2 to 3 days, please return.  If your symptoms are severe, please go to the emergency room.  Follow-up with your primary care provider for further evaluation and management of your symptoms as well as ongoing wellness visits.  I hope you feel better!

## 2022-11-18 NOTE — ED Triage Notes (Signed)
Pt presents with laceration in the right hand after her dog bit her 1 hr ago.   Dog vaccines are up to date.

## 2022-11-21 DIAGNOSIS — Z79899 Other long term (current) drug therapy: Secondary | ICD-10-CM | POA: Diagnosis not present

## 2022-11-23 DIAGNOSIS — H0102A Squamous blepharitis right eye, upper and lower eyelids: Secondary | ICD-10-CM | POA: Diagnosis not present

## 2022-11-23 DIAGNOSIS — H0102B Squamous blepharitis left eye, upper and lower eyelids: Secondary | ICD-10-CM | POA: Diagnosis not present

## 2022-11-23 DIAGNOSIS — H40013 Open angle with borderline findings, low risk, bilateral: Secondary | ICD-10-CM | POA: Diagnosis not present

## 2022-11-23 DIAGNOSIS — H16213 Exposure keratoconjunctivitis, bilateral: Secondary | ICD-10-CM | POA: Diagnosis not present

## 2022-11-23 DIAGNOSIS — H43813 Vitreous degeneration, bilateral: Secondary | ICD-10-CM | POA: Diagnosis not present

## 2022-11-26 ENCOUNTER — Other Ambulatory Visit: Payer: Self-pay | Admitting: Internal Medicine

## 2022-11-27 ENCOUNTER — Encounter: Payer: Self-pay | Admitting: Internal Medicine

## 2022-11-28 NOTE — Telephone Encounter (Signed)
Last Fill: 10/26/2022  Next Visit: 02/15/2023  Last Visit: 11/15/2022  Dx: Seronegative rheumatoid arthritis   Current Dose per office note on 11/15/2022: not discussed  Okay to refill Prednisone?

## 2022-11-30 DIAGNOSIS — M25374 Other instability, right foot: Secondary | ICD-10-CM | POA: Diagnosis not present

## 2022-11-30 DIAGNOSIS — M79671 Pain in right foot: Secondary | ICD-10-CM | POA: Diagnosis not present

## 2022-11-30 DIAGNOSIS — M21621 Bunionette of right foot: Secondary | ICD-10-CM | POA: Diagnosis not present

## 2022-11-30 DIAGNOSIS — M79672 Pain in left foot: Secondary | ICD-10-CM | POA: Diagnosis not present

## 2022-11-30 DIAGNOSIS — M2041 Other hammer toe(s) (acquired), right foot: Secondary | ICD-10-CM | POA: Diagnosis not present

## 2022-11-30 DIAGNOSIS — M7741 Metatarsalgia, right foot: Secondary | ICD-10-CM | POA: Diagnosis not present

## 2022-12-07 DIAGNOSIS — M5412 Radiculopathy, cervical region: Secondary | ICD-10-CM | POA: Diagnosis not present

## 2022-12-09 ENCOUNTER — Other Ambulatory Visit (HOSPITAL_COMMUNITY): Payer: Self-pay

## 2022-12-12 ENCOUNTER — Other Ambulatory Visit (HOSPITAL_COMMUNITY): Payer: Self-pay

## 2022-12-13 ENCOUNTER — Ambulatory Visit: Payer: Medicare Other | Admitting: Physician Assistant

## 2022-12-13 VITALS — BP 125/79 | HR 64 | Ht 66.5 in | Wt 175.4 lb

## 2022-12-13 DIAGNOSIS — M79671 Pain in right foot: Secondary | ICD-10-CM | POA: Diagnosis not present

## 2022-12-13 DIAGNOSIS — Z01818 Encounter for other preprocedural examination: Secondary | ICD-10-CM | POA: Diagnosis not present

## 2022-12-13 DIAGNOSIS — I10 Essential (primary) hypertension: Secondary | ICD-10-CM

## 2022-12-13 DIAGNOSIS — W540XXD Bitten by dog, subsequent encounter: Secondary | ICD-10-CM | POA: Diagnosis not present

## 2022-12-13 NOTE — Patient Instructions (Addendum)
It was great to see you!  Please do your stool card and return as directed.  Take care,  Jarold Motto PA-C

## 2022-12-13 NOTE — Progress Notes (Signed)
Vanessa Harmon is a 72 y.o. female here for a follow up of a pre-existing problem.  History of Present Illness:   Chief Complaint  Patient presents with   surgical clearance     Pt states she is having surgery on her right foot on October 1.     HPI  Pre-surgical clearance Having hammertoe/bunion correction on right foot on 10/01. Endorses increase in pain and development of an ulcer laterally on 3rd distal toe. She has had prior surgical procedures in the past and no significant issues with tolerance of anesthesia. She is able to do house/yard work without any chest pain, shortness of breath.  Dog Scratch (Right Arm): Reports that she received a moderate laceration on her right arm from her dog. Presented to ED at [facility] and was prescribed 875-125 mg Augmentin and had xray. Completed antibiotics.  States that she has been washing her hands thoroughly before applying Aquaphor and redressing wound.   Hypertension: Managed/Compliant with 10 mg Amlodipine daily, 300 mg Irbesartan daily, and 20 mg Lasix.  Denies chest pain, shortness of breath, blurred vision, dizziness, unusual headaches, or lower leg swelling.   BP Readings from Last 3 Encounters:  12/13/22 125/79  11/18/22 (!) 157/93  11/15/22 135/78   Past Medical History:  Diagnosis Date   Adenomatous polyp    Allergy    Anxiety    takes Ativan daily as needed   Arthritis    inflammatory arthritis (Dr. Verdie Shire)   Asthma    Cataract    Chronic back pain    scoliosis    Constipation    takes miralax every other day   COVID 05/19/2022   Depression    takes Lexapro daily   Eczema    Essential hypertension, benign    takes Hyzaar and Metoprolol daily   Family history of adverse reaction to anesthesia    Sister PONV   Fibromyalgia    GERD (gastroesophageal reflux disease)    History of colon polyps    History of shingles    Hyperlipidemia    takes Atorvastatin daily   Insomnia    takes  Melatonin nightly   Joint pain    Osteoarthritis    Osteoporosis    PSVT (paroxysmal supraventricular tachycardia)    Recurrent upper respiratory infection (URI)    Rheumatoid arthritis (HCC)    sees Dr. Dierdre Forth; stable on Orencia   Rhinitis, allergic    uses Flonase daily   Urinary urgency    Urticaria      Social History   Tobacco Use   Smoking status: Former    Current packs/day: 0.00    Average packs/day: 1 pack/day for 20.0 years (20.0 ttl pk-yrs)    Types: Cigarettes    Start date: 3    Quit date: 1998    Years since quitting: 26.6    Passive exposure: Never   Smokeless tobacco: Never  Vaping Use   Vaping status: Never Used  Substance Use Topics   Alcohol use: Yes    Comment: rarely with dinner   Drug use: No    Past Surgical History:  Procedure Laterality Date   ADENOIDECTOMY     APPENDECTOMY     BACK SURGERY  2012   at age 33 d/t scoliosis   BLEPHAROPLASTY  2018   BREAST BIOPSY     cataract surgery     COLONOSCOPY     HARDWARE REMOVAL Left 02/24/2020   Procedure: HARDWARE REMOVAL LEFT DISTAL RADIUS;  Surgeon: Betha Loa, MD;  Location: [facility];  Service: Orthopedics;  Laterality: Left;   KNEE ARTHROSCOPY Bilateral    neuroma removed from foot     unsure of which foot   OPEN REDUCTION INTERNAL FIXATION (ORIF) DISTAL RADIAL FRACTURE Left 08/15/2019   Procedure: OPEN REDUCTION INTERNAL FIXATION (ORIF) DISTAL RADIAL FRACTURE;  Surgeon: Betha Loa, MD;  Location: [facility];  Service: Orthopedics;  Laterality: Left;  block in preop   POSTERIOR CERVICAL FUSION/FORAMINOTOMY N/A 05/29/2020   Procedure: Removal of hardware, Replacement of thoracic 8, thoracic 9, thoracic 10 screws, Right Thoracic 6-7, Right Thoracic 7-8 Laminectomies with thoracic hooks from Thoracic 4-5 to Thoracic 10;  Surgeon: Maeola Harman, MD;  Location: [facility] OR;  Service: Neurosurgery;  Laterality: N/A;   POSTERIOR LUMBAR FUSION 4 LEVEL N/A 02/15/2018    Procedure: Decompression and fusion Thoracic nine to Lumbar two with exploration of previous fusion;  Surgeon: Maeola Harman, MD;  Location: [facility] OR;  Service: Neurosurgery;  Laterality: N/A;   SHOULDER SURGERY Right    TONSILLECTOMY     TOTAL HIP ARTHROPLASTY Right 04/15/2014   Procedure: RIGHT TOTAL HIP ARTHROPLASTY ANTERIOR APPROACH;  Surgeon: Sheral Apley, MD;  Location: [facility] OR;  Service: Orthopedics;  Laterality: Right;   TOTAL KNEE ARTHROPLASTY Left 11/26/2019   Procedure: TOTAL KNEE ARTHROPLASTY;  Surgeon: Durene Romans, MD;  Location: [facility]S;  Service: Orthopedics;  Laterality: Left;  70 mins    Family History  Problem Relation Age of Onset   Atrial fibrillation Mother    Stroke Mother    Allergic rhinitis Mother    Allergic rhinitis Father    AAA (abdominal aortic aneurysm) Father    COPD Father    Prostate cancer Father    Atrial fibrillation Brother    Hypertension Other        unspecified grandmother   AAA (abdominal aortic aneurysm) Other        unspecified grandfather   Cancer Other        unspecified grandfather   Neuropathy Neg Hx     Allergies  Allergen Reactions   Advair Hfa [Fluticasone-Salmeterol] Other (See Comments)    Hoarness   Statins Other (See Comments)   Breo Ellipta [Fluticasone Furoate-Vilanterol] Other (See Comments)    Severe hoarseness    Fosamax [Alendronate Sodium] Other (See Comments)    myalgias   Levaquin [Levofloxacin] Other (See Comments)    Lightheadedness, not feeling well, ear pain   Infliximab Other (See Comments)    Skin reaction   Spiriva Handihaler [Tiotropium Bromide Monohydrate]     UNSPECIFIED REACTION    Verapamil    Tape Rash    PAPER TAPE: Causes severe rash and weeping    Current Medications:   Current Outpatient Medications:    albuterol (PROAIR HFA) 108 (90 Base) MCG/ACT inhaler, Inhale two puffs every 4-6 hours if needed for cough or wheeze., Disp: 8.5 Inhaler, Rfl: 1   amLODipine (NORVASC) 10 MG tablet, TAKE  ONE TABLET BY MOUTH DAILY, Disp: 90 tablet, Rfl: 0   aspirin EC 81 MG tablet, Take 1 tablet (81 mg total) by mouth daily. Swallow whole., Disp: 30 tablet, Rfl: 11   azelastine (ASTELIN) 0.1 % nasal spray, Place 1 spray into both nostrils 2 (two) times daily. Use in each nostril as directed, Disp: , Rfl:    azithromycin (ZITHROMAX) 250 MG tablet, Take by mouth., Disp: , Rfl:    Bempedoic Acid (NEXLETOL) 180 MG TABS, Take 1 Dose by  mouth daily., Disp: 90 tablet, Rfl: 3   betamethasone dipropionate 0.05 % cream, SMARTSIG:1 sparingly Topical Twice Daily, Disp: , Rfl:    budesonide (PULMICORT) 0.5 MG/2ML nebulizer solution, Take by nebulization., Disp: , Rfl:    Calcium Carb-Cholecalciferol (CALCIUM 600/VITAMIN D3 PO), Take 1 tablet by mouth daily., Disp: , Rfl:    carboxymethylcellulose (REFRESH PLUS) 0.5 % SOLN, Place 1 drop into both eyes 3 (three) times daily as needed (dry eyes). , Disp: , Rfl:    Cetirizine HCl (ZYRTEC ALLERGY) 10 MG CAPS, , Disp: , Rfl:    Cholecalciferol 125 MCG (5000 UT) TABS, Vitamin D3 125 mcg (5,000 unit) tablet  Take 1 tablet every day by oral route., Disp: , Rfl:    denosumab (PROLIA) 60 MG/ML SOSY injection, Inject 60 mg into the skin every 6 (six) months. 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 Appt on 11/09/22, Disp: 1 mL, Rfl: 0   diphenhydrAMINE (BENADRYL) 25 MG tablet, Take 50 mg by mouth daily as needed for allergies., Disp: , Rfl:    docusate sodium (COLACE) 100 MG capsule, 100 mg 2 (two) times daily., Disp: , Rfl:    escitalopram (LEXAPRO) 20 MG tablet, TAKE ONE TABLET BY MOUTH DAILY, Disp: 90 tablet, Rfl: 1   etanercept (ENBREL SURECLICK) 50 MG/ML injection, Inject 50 mg into the skin once a week., Disp: 12 mL, Rfl: 0   Evolocumab (REPATHA SURECLICK) 140 MG/ML SOAJ, INJECT 1 DOSE INTO THE SKIN EVERY 14 (FOURTEEN) DAYS, Disp: 6 mL, Rfl: 3   ezetimibe (ZETIA) 10 MG tablet, TAKE ONE TABLET BY MOUTH DAILY, Disp: 90 tablet, Rfl: 0    famotidine (PEPCID) 40 MG tablet, TAKE 1 TABLET (40 MG TOTAL) BY MOUTH AT BEDTIME., Disp: 30 tablet, Rfl: 5   furosemide (LASIX) 20 MG tablet, TAKE ONE TABLET BY MOUTH DAILY, Disp: 90 tablet, Rfl: 0   gabapentin (NEURONTIN) 300 MG capsule, Take 1 capsule (300 mg total) by mouth at bedtime., Disp: 90 capsule, Rfl: 3   guaiFENesin (MUCINEX) 600 MG 12 hr tablet, Take 600 mg by mouth 2 (two) times daily as needed for cough or to loosen phlegm., Disp: , Rfl:    HYDROcodone-acetaminophen (NORCO) 10-325 MG tablet, 1 tablet every 6 (six) hours as needed., Disp: , Rfl:    indomethacin (INDOCIN SR) 75 MG CR capsule, TAKE ONE CAPSULE BY MOUTH DAILY WITH BREAKFAST, Disp: 90 capsule, Rfl: 0   ipratropium (ATROVENT) 0.03 % nasal spray, Place 1 spray into both nostrils 2 (two) times daily as needed for rhinitis., Disp: , Rfl:    irbesartan (AVAPRO) 300 MG tablet, TAKE ONE TABLET BY MOUTH DAILY, Disp: 90 tablet, Rfl: 1   Lifitegrast (XIIDRA) 5 % SOLN, Place 1 drop into both eyes in the morning and at bedtime. , Disp: , Rfl:    melatonin 3 MG TABS tablet, Take by mouth., Disp: , Rfl:    methocarbamol (ROBAXIN) 500 MG tablet, Take 1 tablet (500 mg total) by mouth every 6 (six) hours as needed for muscle spasms., Disp: 60 tablet, Rfl: 2   montelukast (SINGULAIR) 10 MG tablet, Take 1 tablet (10 mg total) by mouth at bedtime., Disp: 30 tablet, Rfl: 5   Multiple Vitamin (MULTIVITAMIN) capsule, Take 1 capsule by mouth daily., Disp: , Rfl:    omeprazole (PRILOSEC) 40 MG capsule, Take 1 capsule (40 mg total) by mouth daily. TAKE ONE CAPSULE BY MOUTH DAILY BEFORE BREAKFAST DAILY, Disp: 90 capsule, Rfl: 3   OVER THE COUNTER  MEDICATION, Place 1 application into both eyes at bedtime. Genteal overnight eye ointment, Disp: , Rfl:    polyethylene glycol powder (GLYCOLAX/MIRALAX) 17 GM/SCOOP powder, Miralax 17 gram/dose oral powder, Disp: , Rfl:    predniSONE (DELTASONE) 5 MG tablet, TAKE 1 TABLET (5 MG TOTAL) BY MOUTH DAILY WITH  BREAKFAST, Disp: 90 tablet, Rfl: 0   PRESCRIPTION MEDICATION, Place 1 spray into the nose in the morning and at bedtime. Budesonide 0.5 mg / 2 mL Nasal rinse, Disp: , Rfl:    pseudoephedrine (SUDAFED) 30 MG tablet, Take 30 mg by mouth every 4 (four) hours as needed for congestion., Disp: , Rfl:    SYMBICORT 160-4.5 MCG/ACT inhaler, Inhale 2 puffs into the lungs 2 (two) times daily., Disp: , Rfl:    SYRINGE-NEEDLE, DISP, 3 ML (B-D 3CC LUER-LOK SYR 25GX1") 25G X 1" 3 ML MISC, USE TO INJECT VITAMIN B12, Disp: 10 each, Rfl: 0   traZODone (DESYREL) 150 MG tablet, TAKE ONE TABLET BY MOUTH DAILY, Disp: 90 tablet, Rfl: 0   valACYclovir (VALTREX) 1000 MG tablet, Take 1,000 mg by mouth daily. , Disp: , Rfl:    vitamin C (ASCORBIC ACID) 500 MG tablet, Take 500 mg by mouth daily., Disp: , Rfl:    Review of Systems:   ROS Negative unless otherwise specified per HPI.   Vitals:   Vitals:   12/13/22 1340  BP: 125/79  Pulse: 64  SpO2: 97%  Weight: 175 lb 6 oz (79.5 kg)  Height: 5' 6.5" (1.689 m)     Body mass index is 27.88 kg/m.  Physical Exam:   Physical Exam Vitals and nursing note reviewed.  Constitutional:      General: She is not in acute distress.    Appearance: She is well-developed. She is not ill-appearing or toxic-appearing.  Cardiovascular:     Rate and Rhythm: Normal rate and regular rhythm.     Pulses: Normal pulses.     Heart sounds: Normal heart sounds, S1 normal and S2 normal.  Pulmonary:     Effort: Pulmonary effort is normal.     Breath sounds: Normal breath sounds.  Feet:     Comments: Right foot with stage 1 ulceration to lateral aspect of second toe Skin:    General: Skin is warm and dry.  Neurological:     Mental Status: She is alert.     GCS: GCS eye subscore is 4. GCS verbal subscore is 5. GCS motor subscore is 6.  Psychiatric:        Speech: Speech normal.        Behavior: Behavior normal. Behavior is cooperative.     Assessment and Plan:   Essential  (primary) hypertension Normotensive Continue 10 mg Amlodipine daily, 300 mg Irbesartan daily, and 20 mg Lasix.  Follow-up in 38mo, sooner if concerns Most recent EKG from 6 month(s) ago in our office was reviewed and this was overall normal  Preop examination; Right foot pain Feel patient is overall low risk for this surgical procedure Will complete forms for patient and fax to podiatry  Dog bite, subsequent encounter No red flags Continue current wound care This was not examined by me in office  I,Emily Lagle,acting as a scribe for Jarold Motto, PA.,have documented all relevant documentation on the behalf of Jarold Motto, PA,as directed by  Jarold Motto, PA while in the presence of Jarold Motto, [place].  I, Jarold Motto, [place], have reviewed all documentation for this visit. The documentation on 12/13/22 for the exam, diagnosis,  procedures, and orders are all accurate and complete.  Jarold Motto, PA-C

## 2022-12-14 ENCOUNTER — Encounter: Payer: Self-pay | Admitting: Internal Medicine

## 2022-12-15 ENCOUNTER — Other Ambulatory Visit (HOSPITAL_COMMUNITY): Payer: Self-pay

## 2022-12-16 ENCOUNTER — Other Ambulatory Visit: Payer: Medicare Other

## 2022-12-17 ENCOUNTER — Other Ambulatory Visit: Payer: Self-pay | Admitting: Physician Assistant

## 2022-12-20 ENCOUNTER — Telehealth: Payer: Self-pay | Admitting: *Deleted

## 2022-12-20 ENCOUNTER — Ambulatory Visit (INDEPENDENT_AMBULATORY_CARE_PROVIDER_SITE_OTHER): Payer: Medicare Other

## 2022-12-20 DIAGNOSIS — R195 Other fecal abnormalities: Secondary | ICD-10-CM

## 2022-12-20 DIAGNOSIS — R710 Precipitous drop in hematocrit: Secondary | ICD-10-CM

## 2022-12-20 LAB — FECAL OCCULT BLOOD, IMMUNOCHEMICAL: Fecal Occult Bld: POSITIVE — AB

## 2022-12-20 NOTE — Telephone Encounter (Signed)
CRITICAL VALUE STICKER  CRITICAL VALUE: IFOB positive  RECEIVER (on-site recipient of call):Deandra Gadson, LPN  DATE & TIME NOTIFIED: 12/20/2022 at 4:07PM  MESSENGER (representative from lab): Lawson Fiscal  MD NOTIFIED: Jarold Motto, PA  TIME OF NOTIFICATION: 4:08  RESPONSE: Verbal order to  Call pt and tell her will put in GI referral.

## 2022-12-20 NOTE — Telephone Encounter (Signed)
Left detailed message on personal voicemail IFOB was positive. I am putting a referral in for GI someone will contact you to schedule an appt or send message to your My Chart so keep and eye out. Any questions please call the office.

## 2022-12-22 ENCOUNTER — Encounter: Payer: Self-pay | Admitting: Physician Assistant

## 2022-12-27 ENCOUNTER — Ambulatory Visit: Payer: Medicare Other | Admitting: Internal Medicine

## 2022-12-27 ENCOUNTER — Encounter: Payer: Self-pay | Admitting: Physician Assistant

## 2022-12-30 ENCOUNTER — Other Ambulatory Visit (HOSPITAL_COMMUNITY): Payer: Self-pay

## 2023-01-09 DIAGNOSIS — M4135 Thoracogenic scoliosis, thoracolumbar region: Secondary | ICD-10-CM | POA: Diagnosis not present

## 2023-01-10 DIAGNOSIS — M47896 Other spondylosis, lumbar region: Secondary | ICD-10-CM | POA: Diagnosis not present

## 2023-01-10 DIAGNOSIS — Z96641 Presence of right artificial hip joint: Secondary | ICD-10-CM | POA: Diagnosis not present

## 2023-01-10 DIAGNOSIS — M5412 Radiculopathy, cervical region: Secondary | ICD-10-CM | POA: Diagnosis not present

## 2023-01-10 DIAGNOSIS — M961 Postlaminectomy syndrome, not elsewhere classified: Secondary | ICD-10-CM | POA: Diagnosis not present

## 2023-01-11 ENCOUNTER — Other Ambulatory Visit (HOSPITAL_COMMUNITY): Payer: Self-pay

## 2023-01-12 ENCOUNTER — Encounter: Payer: Self-pay | Admitting: Internal Medicine

## 2023-01-18 ENCOUNTER — Other Ambulatory Visit: Payer: Self-pay

## 2023-01-18 ENCOUNTER — Other Ambulatory Visit: Payer: Self-pay | Admitting: Physician Assistant

## 2023-01-18 NOTE — Progress Notes (Signed)
Specialty Pharmacy Refill Coordination Note  MARILYNN EKSTEIN is a 72 y.o. female contacted today regarding refills of specialty medication(s) Etanercept   Patient requested Delivery   Delivery date: 01/20/23   Verified address: Patient address [address]  [place] [id number]   Medication will be filled on 01/19/23.

## 2023-01-18 NOTE — Telephone Encounter (Signed)
Pt requesting refill for Gabapentin 300 mg. Last OV 12/13/2022.

## 2023-01-19 ENCOUNTER — Other Ambulatory Visit: Payer: Self-pay | Admitting: Physician Assistant

## 2023-01-26 ENCOUNTER — Other Ambulatory Visit: Payer: Self-pay | Admitting: Physician Assistant

## 2023-01-31 DIAGNOSIS — H04331 Acute lacrimal canaliculitis of right lacrimal passage: Secondary | ICD-10-CM | POA: Diagnosis not present

## 2023-02-01 ENCOUNTER — Other Ambulatory Visit: Payer: Self-pay | Admitting: Physician Assistant

## 2023-02-02 DIAGNOSIS — J4540 Moderate persistent asthma, uncomplicated: Secondary | ICD-10-CM | POA: Diagnosis not present

## 2023-02-02 DIAGNOSIS — J310 Chronic rhinitis: Secondary | ICD-10-CM | POA: Diagnosis not present

## 2023-02-02 NOTE — Progress Notes (Unsigned)
Office Visit Note  Patient: Vanessa Harmon             Date of Birth: 05/19/1950           MRN: [id number]             PCP: Jarold Motto, PA Referring: Jarold Motto, PA Visit Date: 02/15/2023   Subjective:  Follow-up (Patients states her left elbow has epicondylitis again. )   History of Present Illness: Vanessa Harmon is a 72 y.o. female here for follow up for seronegative RA on prednisone 5 mg daily and indomethacin 75 mg daily and started Enbrel in August.  So far she is not certain about the amount of difference with addition of the Enbrel.  Is not experiencing any injection related reactions.  She has had some additional viral infections that she is not sure if they are related to medicine or incidental.  She had bilateral shoulder steroid injections with Dr. Durwin Nora pain is getting worse again in these areas.  Also feels increase in left elbow pain that is similar to the episode of epicondylitis which we saw in March. She had called in about perioperative management of Enbrel for surgery for toe joint instability.  This was delayed because her husband had a dog hand bite with skin tearing around the same time. Also had mildly low hemoglobin on routine labs with her primary care PA.  Subsequent fecal occult stool card was positive for blood and so was referred to gastroenterology.  Has not had colonoscopy in years when she did there were no concerning findings.  She has a history of previous internal hemorrhoids.  She is still on the indomethacin.  Previous HPI 11/15/2022 Vanessa Harmon is a 72 y.o. female here for follow up for seronegative RA on prednisone 5 mg daily and indomethacin 75 mg daily.  She stopped taking Rinvoq since about 1 month ago as she felt this was worsening her frequency of upper respiratory infection and needing inhaled bronchodilator and breathing treatments.  Was also having increased symptoms during this time due to seasonal allergies.  Since she  stopped the Rinvoq feels this is improved although today is a bad day with persistent coughing.  Noticed small increase in joint pain of several areas including her shoulders and hips and knee.  She had repeat corticosteroid injection in both shoulders which has been improving her symptoms range of motion is better still pain with use.  Persistent bruising and skin rashes with a new avulsion on the left forearm but about the same as usual.   Labs reviewed 10/2022 WBC 11.9 Hgb 11.8 eGFR 47.27   DMARD Hx Methotrexate - LFT elevations Remicade- Rashes Actemra - Rashes Orencia IV - Secondary nonresponder Rinvoq - Respiratory symptoms worse     Previous HPI 09/21/2022 Vanessa Harmon is a 72 y.o. female here for follow up here for follow-up of seronegative RA on Rinvoq 15 mg p.o. daily and prednisone 5 mg daily.  The left elbow pain discussed at her last visit improved after steroid injection and has continued to do well.  Joint pain and inflammation doing pretty well overall chronic back pain is her biggest issue.  Continues having easy bruising and skin tears frequently from her 90 pound Rottweiler.  Recently sick with sinus infection for the past week is currently on amoxicillin for this.  Not associated with any fevers lymphadenopathy or other constitutional symptoms.   Previous HPI 07/13/22 Vanessa Harmon is a 72 y.o.  female here for follow up for seronegative RA on rinvoq 15 mg PO daily and prednisone 5 mg daily.  Since her last visit she finished getting over the persistent sinus infection symptoms after recent COVID illness.  However once the symptoms cleared up she started noticing increase in hip bursitis pain.  Also started getting worsening pain along the medial side of the left elbow.  Most noticeable when using her hands to help her self push off from a seated position due to her hip pain.  Not noticed any visible swelling or erythema she still has bruising around the elbow but that  is more chronic and there are multiple skin tears in the area as well.  Does not feel like she is having flareup of increased joint symptoms outside of the hip and elbow pains.  She previously tried use of the elbow brace which we discussed before for her medial epicondylitis but just gets irritation around the skin and elbow does not feel much benefit.   Previous HPI 06/16/22 Vanessa Harmon is a 72 y.o. female here for follow up for seronegative RA on prednisone 5 mg daily and rinvoq 15 mg daily started since 2/18. Also prolia for osteoporosis last injection on 1/24 without complication. Main issue has been sickness with COVID positive test 2/8 and persistent symptoms. She had subsequent sinus infection treated with augmentin starting 2/21 and had repeat labs checked yesterday. Brain fog, fatigue worse during COVID infection and partially improved since. She thinks rinvoq is starting to help with swelling and pain in hands and wrists but still significant overall symptoms. Has some skin tears and bruising on arms from her dog.   Previous HPI 03/16/22 Vanessa Harmon is a 72 y.o. female here for follow up for seropositive RA on prednisone 5 mg daily. Methotrexate was discontinued due to worsening laboratory function tests and Actemra stopped due to developing palpable rashes throughout her legs similar to previous medication reaction to infliximab. She is also noticed easier bruising than usual and has seen discoloration of her nails with some separation and cracking.  Prednisone is helping somewhat but still has ongoing joint pain and stiffness specially off of her previous medications.  In particular bothering her lately has been persistent pain along the medial side of her left elbow does not recall any specific injury to this area but does feel like there is a sense of swelling and pressure.   Previous HPI 12/14/21 Vanessa Harmon is a 72 y.o. female here for follow up for seropositive RA on  methotrexate 15 mg subcu weekly prednisone 5 mg daily leucovorin 1 mg daily and after starting Actemra 162 mg [place] q. 14 days.  She feels like arthritis symptoms have been well controlled on her current medications.  No significant flareups.  She still gets swelling in her wrist but no other visible joint swelling.  She has had pretty extensive bruising and easy skin tearing on both arms often from playing with her dog.  Lab tests checked in July did show appears to be a small decrease in estimated GFR.   Previous HPI 09/10/2021 AVALIN DENZLER is a 72 y.o. female here for evaluation of chronic back pain associated with seropositive RA she has been seeing Azucena Fallen on treatment with orencia IV, MTX 15 mg [place] weekly, leucovorin 1 mg daily, and prednisone 5 mg daily.  She was originally diagnosed with inflammatory arthritis by Dr. Dareen Piano about a decade ago.  Apparently the worst initial symptom at  that time was left hip pain and inflammation with concerning findings on MRI.  Was started on methotrexate as a steroid sparing DMARD treatment.  Apparently had initial concern for seronegative rheumatoid arthritis versus PMR presentation.  She did not tolerate methotrexate well initially worst side effect was substantial hair loss.  Also did not tolerate well on leflunomide.  She was started on infliximab treatment developed a significant adverse reaction with new skin rashes in multiple areas and severe finger and toe nail changes that lasted about a year after discontinuing the medication.  She was started on IV Orencia monthly infusions which has been beneficial for symptoms but still notices some amount ongoing and that symptoms tend to worsen between each dosing interval.  She was restarted on methotrexate this time with leucovorin in the past few months and this has improved her joint inflammation further.  She remains on low-dose prednisone 5 mg daily throughout most of this time.  Most recent definite  flareup of her rheumatoid arthritis was about 6 months ago. In addition to inflammatory joint disease she has significant amount of generalized osteoarthritis.  She reports bone-on-bone degenerative changes with slightly impaired range of motion in the left shoulder.  She had previous left knee total arthroplasty.  She has degenerative arthritis at multiple levels in the spine with history of repeat lumbar spine surgery.  She has some spinal and foraminal stenosis with radicular symptoms.  She is also had recurrent problems with hip bursitis has been responsive to local steroid injection treatments with Dr. Charlann Boxer.  She takes gabapentin 300 mg at night and as needed Robaxin 500 mg for the ongoing symptoms. She has not had any history with recurrent infections or major side effects on her current regimen.  She does have extensive bruising mostly worst in the past year and worst on extensor surfaces of arms and legs.  She denies any history of abnormal bleeding and blood clots. She has chronic dry eyes and sees Dr. Dione Booze currently uses xiidra and genteal drops for this.   Review of Systems  Constitutional:  Positive for fatigue.  HENT:  Negative for mouth sores and mouth dryness.   Eyes:  Positive for dryness.  Respiratory:  Negative for shortness of breath.   Cardiovascular:  Negative for chest pain and palpitations.  Gastrointestinal:  Positive for blood in stool and constipation. Negative for diarrhea.  Endocrine: Negative for increased urination.  Genitourinary:  Negative for involuntary urination.  Musculoskeletal:  Positive for joint pain, joint pain, joint swelling, myalgias, muscle weakness, morning stiffness and myalgias. Negative for gait problem and muscle tenderness.  Skin:  Negative for color change, rash, hair loss and sensitivity to sunlight.  Allergic/Immunologic: Positive for susceptible to infections.  Neurological:  Negative for dizziness and headaches.  Hematological:  Negative for  swollen glands.  Psychiatric/Behavioral:  Positive for depressed mood and sleep disturbance. The patient is nervous/anxious.     PMFS History:  Patient Active Problem List   Diagnosis Date Noted   Age-related osteoporosis without current pathological fracture 06/16/2022   Medial epicondylitis of left elbow 03/16/2022   High risk medication use 09/10/2021   Posterior vitreous detachment of left eye 09/29/2020   Thoracic spondylosis with cord compression 05/29/2020   Heterotopic ossification 03/30/2020   Loosening of hardware in spine (HCC) 01/27/2020   Posterior vitreous detachment of right eye 01/21/2020   Pseudophakia 01/21/2020   [place] retinal tear of right eye 01/21/2020   Body mass index (BMI) 28.0-28.9, adult 11/27/2019  Left knee OA 11/26/2019   Numbness of foot 02/07/2019   Seronegative rheumatoid arthritis (HCC) 01/25/2019   Peripheral polyneuropathy 01/14/2019   Spinal stenosis, thoracolumbar region 01/14/2019   Thoracogenic scoliosis, thoracolumbar region 01/14/2019   Idiopathic scoliosis of thoracolumbar region 02/15/2018   Rhinitis, chronic 05/16/2017   Moderate persistent asthma without complication 05/04/2017   History of polymyalgia rheumatica 02/15/2015   DJD (degenerative joint disease) 04/15/2014   Lumbar spine scoliosis 11/28/2013   Degenerative spondylolisthesis 10/09/2013   Lumbar radiculopathy 09/16/2013   Lumbosacral radiculitis 07/31/2013   Scoliosis, or kyphoscoliosis, idiopathic 07/31/2013   Lumbar back pain 12/19/2011   Hyperlipidemia 10/16/2010   Depression 10/16/2010   Lumbar spondylosis 10/16/2010   Gastroesophageal reflux disease 10/16/2010   Adenomatous polyp of colon 10/16/2010   History of paroxysmal supraventricular tachycardia 10/16/2010   Essential (primary) hypertension 10/16/2010    Past Medical History:  Diagnosis Date   Adenomatous polyp    Allergy    Anxiety    takes Ativan daily as needed   Arthritis    inflammatory  arthritis (Dr. Verdie Shire)   Asthma    Cataract    Chronic back pain    scoliosis    Constipation    takes miralax every other day   COVID 05/19/2022   Depression    takes Lexapro daily   Eczema    Essential hypertension, benign    takes Hyzaar and Metoprolol daily   Family history of adverse reaction to anesthesia    Sister PONV   Fibromyalgia    GERD (gastroesophageal reflux disease)    History of colon polyps    History of shingles    Hyperlipidemia    takes Atorvastatin daily   Insomnia    takes Melatonin nightly   Joint pain    Osteoarthritis    Osteoporosis    PSVT (paroxysmal supraventricular tachycardia) (HCC)    Recurrent upper respiratory infection (URI)    Rheumatoid arthritis (HCC)    sees Dr. Dierdre Forth; stable on Orencia   Rhinitis, allergic    uses Flonase daily   Urinary urgency    Urticaria     Family History  Problem Relation Age of Onset   Atrial fibrillation Mother    Stroke Mother    Allergic rhinitis Mother    Allergic rhinitis Father    AAA (abdominal aortic aneurysm) Father    COPD Father    Prostate cancer Father    Atrial fibrillation Brother    Hypertension Other        unspecified grandmother   AAA (abdominal aortic aneurysm) Other        unspecified grandfather   Cancer Other        unspecified grandfather   Neuropathy Neg Hx    Past Surgical History:  Procedure Laterality Date   ADENOIDECTOMY     APPENDECTOMY     BACK SURGERY  2012   at age 5 d/t scoliosis   BLEPHAROPLASTY  2018   BREAST BIOPSY     cataract surgery     COLONOSCOPY     HARDWARE REMOVAL Left 02/24/2020   Procedure: HARDWARE REMOVAL LEFT DISTAL RADIUS;  Surgeon: Betha Loa, MD;  Location: [facility];  Service: Orthopedics;  Laterality: Left;   KNEE ARTHROSCOPY Bilateral    neuroma removed from foot     unsure of which foot   OPEN REDUCTION INTERNAL FIXATION (ORIF) DISTAL RADIAL FRACTURE Left 08/15/2019   Procedure: OPEN REDUCTION  INTERNAL FIXATION (ORIF) DISTAL RADIAL FRACTURE;  Surgeon: Betha Loa, MD;  Location: [facility];  Service: Orthopedics;  Laterality: Left;  block in preop   POSTERIOR CERVICAL FUSION/FORAMINOTOMY N/A 05/29/2020   Procedure: Removal of hardware, Replacement of thoracic 8, thoracic 9, thoracic 10 screws, Right Thoracic 6-7, Right Thoracic 7-8 Laminectomies with thoracic hooks from Thoracic 4-5 to Thoracic 10;  Surgeon: Maeola Harman, MD;  Location: [facility] OR;  Service: Neurosurgery;  Laterality: N/A;   POSTERIOR LUMBAR FUSION 4 LEVEL N/A 02/15/2018   Procedure: Decompression and fusion Thoracic nine to Lumbar two with exploration of previous fusion;  Surgeon: Maeola Harman, MD;  Location: [facility] OR;  Service: Neurosurgery;  Laterality: N/A;   SHOULDER SURGERY Right    TONSILLECTOMY     TOTAL HIP ARTHROPLASTY Right 04/15/2014   Procedure: RIGHT TOTAL HIP ARTHROPLASTY ANTERIOR APPROACH;  Surgeon: Sheral Apley, MD;  Location: [facility] OR;  Service: Orthopedics;  Laterality: Right;   TOTAL KNEE ARTHROPLASTY Left 11/26/2019   Procedure: TOTAL KNEE ARTHROPLASTY;  Surgeon: Durene Romans, MD;  Location: [facility]S;  Service: Orthopedics;  Laterality: Left;  70 mins   Social History   Social History Narrative   Retired Engineer, civil (consulting)   No children   Lives with husband   Immunization History  Administered Date(s) Administered   Influenza Split 01/31/2012   Influenza Whole 02/16/2010   Influenza, High Dose Seasonal PF 12/13/2018, 02/18/2022   Influenza,inj,Quad PF,6+ Mos 01/14/2014, 01/27/2015, 02/02/2016   Influenza,inj,quad, With Preservative 02/26/2020   Influenza-Unspecified 02/24/2021   Moderna Sars-Covid-2 Vaccination 02/20/2022   PFIZER(Purple Top)SARS-COV-2 Vaccination 05/25/2019, 06/15/2019, 02/05/2020, 08/03/2020   Pneumococcal Conjugate-13 01/27/2015   Pneumococcal Polysaccharide-23 05/26/2017   Tdap 10/04/2011   Zoster Recombinant(Shingrix) 10/13/2017, 01/02/2018     Objective: Vital  Signs: BP (!) 154/76 (Patient Position: Sitting, Cuff Size: Normal)   Pulse (!) 57   Resp 14   Ht 5\' 6"  (1.676 m)   Wt 172 lb (78 kg)   BMI 27.76 kg/m    Physical Exam Eyes:     Conjunctiva/sclera: Conjunctivae normal.  Cardiovascular:     Rate and Rhythm: Normal rate and regular rhythm.  Pulmonary:     Effort: Pulmonary effort is normal.     Breath sounds: Normal breath sounds.  Lymphadenopathy:     Cervical: No cervical adenopathy.  Skin:    General: Skin is warm and dry.     Findings: Bruising present. No rash.  Neurological:     Mental Status: She is alert.  Psychiatric:        Mood and Affect: Mood normal.      Musculoskeletal Exam:  Shoulders full ROM bilateral tenderness to pressure and pain with overhead abduction, no swelling Elbows full ROM, left elbow pain with full flexion and extension, tenderness to pressure at medial epicondyle, no palpable swelling, pain worsened with resisted wrist rotation Wrists full ROM no tenderness or swelling Fingers full ROM, chronic finger nodules, lateral deviation of DIPs, thenar muscle atrophy Hip pain with FADIR on left side Knees full ROM no tenderness or swelling, patellofemoral crepitus Ankles full ROM no tenderness or swelling  Investigation: No additional findings.  Imaging: No results found.  Recent Labs: Lab Results  Component Value Date   WBC 10.5 11/16/2022   HGB 12.6 11/16/2022   PLT 314.0 11/16/2022   NA 141 10/26/2022   K 4.5 10/26/2022   CL 101 10/26/2022   CO2 29 10/26/2022   GLUCOSE 119 (H) 10/26/2022   BUN 18 10/26/2022   CREATININE 1.15 10/26/2022   BILITOT  0.6 10/26/2022   ALKPHOS 36 (L) 10/26/2022   AST 17 10/26/2022   ALT 17 10/26/2022   PROT 6.4 10/26/2022   ALBUMIN 4.2 10/26/2022   CALCIUM 10.3 10/26/2022   GFRAA >60 11/27/2019   QFTBGOLDPLUS Negative 11/21/2022    Speciality Comments: No specialty comments available.  Procedures:  Medium Joint Inj: L elbow on 02/15/2023 3:15  PM Indications: pain Details: 27 G 1.5 in needle, medial approach Medications: 1 mL lidocaine 1 %; 40 mg triamcinolone acetonide 40 MG/ML Outcome: tolerated well, no immediate complications Procedure, treatment alternatives, risks and benefits explained, specific risks discussed. Consent was given by the patient. Immediately prior to procedure a time out was called to verify the correct patient, procedure, equipment, support staff and site/side marked as required. Patient was prepped and draped in the usual sterile fashion.     Allergies: Advair hfa [fluticasone-salmeterol], Statins, Breo ellipta [fluticasone furoate-vilanterol], Fosamax [alendronate sodium], Levaquin [levofloxacin], Infliximab, Spiriva handihaler [tiotropium bromide monohydrate], Verapamil, and Tape   Assessment / Plan:     Visit Diagnoses: Seronegative rheumatoid arthritis (HCC) - Plan: Sedimentation rate  Ongoing joint pain stiffness multiple areas but no objective synovitis appreciable on exam.  So far not clear if the Enbrel is helping a lot but definitely no major exacerbation.  Recheck sedimentation rate for disease activity monitoring.  Plan to continue on the Enbrel 50 mg subcu weekly and prednisone 5 mg daily.  Continuing indomethacin 75 mg daily and gabapentin 300 mg at night Rx with PCP office.  High risk medication use - Enbrel 50 mg subcu weekly. - Plan: CBC with Differential/Platelet, COMPLETE METABOLIC PANEL WITH GFR  Checking CBC and CMP for medication monitoring now on Enbrel.  She is not sure whether getting several viral infections as an increase related to the medication or just incidental we will monitor this for now.  Persistent easy bruising partially contributed by chronic low-dose steroids.  Discussed potential contribution of long-term indomethacin to upper GI bleeding risk if there are any concerning findings on GI evaluation later this year and her positive Hemoccult stool considering her large benefit  and not clear that this is the case we will continue for now.  Long term (current) use of systemic steroids - Prednisone 5 MG daily  Age-related osteoporosis without current pathological fracture - On Prolia had last dose July 24.  On maintenance vitamin D supplementation.  Plan for repeat bone density monitoring next July 2025.  Medial epicondylitis of left elbow - Plan: Medium Joint Inj: L elbow  Having increased pain again at the left elbow location is most consistent with medial epicondylitis.  No provocative symptoms for cubital tunnel impingement.  No appreciable joint synovitis.  Had about 5 months benefit after the last injection so we will repeat local steroid injection again today.  Tolerated fine without complication  Orders: Orders Placed This Encounter  Procedures   Medium Joint Inj: L elbow   Sedimentation rate   CBC with Differential/Platelet   COMPLETE METABOLIC PANEL WITH GFR   No orders of the defined types were placed in this encounter.    Follow-Up Instructions: Return in about 3 months (around 05/18/2023) for RA on ENB/GC/inj f/u 3mos.   Fuller Plan, MD  Note - This record has been created using [employer].  Chart creation errors have been sought, but may not always  have been located. Such creation errors do not reflect on  the standard of medical care.

## 2023-02-07 ENCOUNTER — Other Ambulatory Visit (HOSPITAL_BASED_OUTPATIENT_CLINIC_OR_DEPARTMENT_OTHER): Payer: Self-pay | Admitting: Internal Medicine

## 2023-02-09 ENCOUNTER — Other Ambulatory Visit: Payer: Self-pay

## 2023-02-09 ENCOUNTER — Other Ambulatory Visit: Payer: Self-pay | Admitting: Internal Medicine

## 2023-02-09 ENCOUNTER — Other Ambulatory Visit (HOSPITAL_COMMUNITY): Payer: Self-pay

## 2023-02-09 DIAGNOSIS — M0600 Rheumatoid arthritis without rheumatoid factor, unspecified site: Secondary | ICD-10-CM

## 2023-02-09 DIAGNOSIS — Z79899 Other long term (current) drug therapy: Secondary | ICD-10-CM

## 2023-02-09 MED ORDER — ENBREL SURECLICK 50 MG/ML ~~LOC~~ SOAJ
50.0000 mg | SUBCUTANEOUS | 0 refills | Status: DC
  Filled 2023-02-09: qty 4, 28d supply, fill #0
  Filled 2023-03-07: qty 4, 28d supply, fill #1
  Filled 2023-03-30: qty 4, 28d supply, fill #2

## 2023-02-09 NOTE — Telephone Encounter (Signed)
Last Fill: 11/18/2022  Labs: 11/16/2022 MCV 100.2, Neutrophils Relative % 81.9, Lymphocytes Relative  9.6, Glucose 119, Alk Phos. 36 GFR 47.27  TB Gold: 11/21/2022 Neg    Next Visit: 02/15/2023  Last Visit: 11/15/2022  [id number] rheumatoid arthritis   Current Dose per office note 11/15/2022: Enbrel 50 mg subcu weekly.   Patient to update labs at upcoming appointment on 02/15/2023.   Okay to refill Enbrel?

## 2023-02-09 NOTE — Progress Notes (Signed)
Specialty Pharmacy Refill Coordination Note  Vanessa Harmon is a 72 y.o. female contacted today regarding refills of specialty medication(s) Etanercept   Patient requested Delivery   Delivery date: 02/15/23   Verified address: [address]   [place] [id number]   Medication will be filled on 02/14/23 pending a refill request.

## 2023-02-15 ENCOUNTER — Encounter: Payer: Self-pay | Admitting: Internal Medicine

## 2023-02-15 ENCOUNTER — Ambulatory Visit: Payer: Medicare Other | Attending: Internal Medicine | Admitting: Internal Medicine

## 2023-02-15 VITALS — BP 154/76 | HR 57 | Resp 14 | Ht 66.0 in | Wt 172.0 lb

## 2023-02-15 DIAGNOSIS — Z79899 Other long term (current) drug therapy: Secondary | ICD-10-CM | POA: Diagnosis not present

## 2023-02-15 DIAGNOSIS — M0600 Rheumatoid arthritis without rheumatoid factor, unspecified site: Secondary | ICD-10-CM | POA: Diagnosis not present

## 2023-02-15 DIAGNOSIS — Z7952 Long term (current) use of systemic steroids: Secondary | ICD-10-CM | POA: Diagnosis not present

## 2023-02-15 DIAGNOSIS — M810 Age-related osteoporosis without current pathological fracture: Secondary | ICD-10-CM | POA: Diagnosis not present

## 2023-02-15 DIAGNOSIS — M7702 Medial epicondylitis, left elbow: Secondary | ICD-10-CM

## 2023-02-16 LAB — CBC WITH DIFFERENTIAL/PLATELET
Absolute Lymphocytes: 939 {cells}/uL (ref 850–3900)
Absolute Monocytes: 545 {cells}/uL (ref 200–950)
Basophils Absolute: 51 {cells}/uL (ref 0–200)
Basophils Relative: 0.5 %
Eosinophils Absolute: 30 {cells}/uL (ref 15–500)
Eosinophils Relative: 0.3 %
HCT: 43.3 % (ref 35.0–45.0)
Hemoglobin: 14.1 g/dL (ref 11.7–15.5)
MCH: 32 pg (ref 27.0–33.0)
MCHC: 32.6 g/dL (ref 32.0–36.0)
MCV: 98.4 fL (ref 80.0–100.0)
MPV: 12.1 fL (ref 7.5–12.5)
Monocytes Relative: 5.4 %
Neutro Abs: 8535 {cells}/uL — ABNORMAL HIGH (ref 1500–7800)
Neutrophils Relative %: 84.5 %
Platelets: 294 10*3/uL (ref 140–400)
RBC: 4.4 10*6/uL (ref 3.80–5.10)
RDW: 12.6 % (ref 11.0–15.0)
Total Lymphocyte: 9.3 %
WBC: 10.1 10*3/uL (ref 3.8–10.8)

## 2023-02-16 LAB — COMPLETE METABOLIC PANEL WITH GFR
AG Ratio: 2.1 (calc) (ref 1.0–2.5)
ALT: 17 U/L (ref 6–29)
AST: 18 U/L (ref 10–35)
Albumin: 4.8 g/dL (ref 3.6–5.1)
Alkaline phosphatase (APISO): 38 U/L (ref 37–153)
BUN/Creatinine Ratio: 17 (calc) (ref 6–22)
BUN: 20 mg/dL (ref 7–25)
CO2: 28 mmol/L (ref 20–32)
Calcium: 10.6 mg/dL — ABNORMAL HIGH (ref 8.6–10.4)
Chloride: 100 mmol/L (ref 98–110)
Creat: 1.17 mg/dL — ABNORMAL HIGH (ref 0.60–1.00)
Globulin: 2.3 g/dL (ref 1.9–3.7)
Glucose, Bld: 152 mg/dL — ABNORMAL HIGH (ref 65–99)
Potassium: 4.7 mmol/L (ref 3.5–5.3)
Sodium: 139 mmol/L (ref 135–146)
Total Bilirubin: 0.5 mg/dL (ref 0.2–1.2)
Total Protein: 7.1 g/dL (ref 6.1–8.1)
eGFR: 49 mL/min/{1.73_m2} — ABNORMAL LOW (ref >=60)

## 2023-02-16 LAB — SEDIMENTATION RATE: Sed Rate: 2 mm/h (ref 0–30)

## 2023-02-16 MED ORDER — TRIAMCINOLONE ACETONIDE 40 MG/ML IJ SUSP
40.0000 mg | INTRAMUSCULAR | Status: AC | PRN
  Administered 2023-02-15: 40 mg via INTRA_ARTICULAR

## 2023-02-16 MED ORDER — LIDOCAINE HCL 1 % IJ SOLN
1.0000 mL | INTRAMUSCULAR | Status: AC | PRN
  Administered 2023-02-15: 1 mL

## 2023-02-20 ENCOUNTER — Encounter: Payer: Self-pay | Admitting: Internal Medicine

## 2023-02-21 ENCOUNTER — Other Ambulatory Visit: Payer: Self-pay | Admitting: Internal Medicine

## 2023-02-21 NOTE — Telephone Encounter (Signed)
Enbrel is not renally excreted or processed. I see a few case reports related to nephropathy (some secondary to pyelonephritis) but no clear data associating Enbrel with renal dysfunction  Chesley Mires, PharmD, MPH, BCPS, CPP Clinical Pharmacist (Rheumatology and Pulmonology)

## 2023-02-21 NOTE — Progress Notes (Unsigned)
Cardiology Office Note:  .   Date:  02/23/2023  ID:  Vanessa Harmon, DOB January 29, 1951, MRN [id number] PCP: Jarold Motto, PA  [facility] Providers Cardiologist:  None    Patient Profile: .      PMH Dyslipidemia/Probably familial hyperlipidemia Statin myalgias Atorvastatin and rosuvastatin intolerant Former tobacco abuse Rheumatoid arthritis  Family history Stroke in mother Hyperlipidemia mother and brother  Referred to advanced lipid disorder clinic and seen by Dr. Rennis Golden 01/12/2021.  She had been on atorvastatin but unfortunately developed significant myalgias and had to discontinue. She was also trialed on rosuvastatin and had myalgias. LDL had been much better controlled around 100, however labs once she discontinued showed total cholesterol 305, HDL 52, triglycerides 149, and LDL 223.  Findings felt to be highly suggestive of a genetic dyslipidemia, probably familial hyperlipidemia.  Her mother had a stroke at an early age (< 5 y/o) and also had high cholesterol.  She has a brother with high cholesterol as well.  She had been placed on ezetimibe.  She was advised by Dr. Rennis Golden to start Repatha with significant improvement in cholesterol.  At follow-up visit 05/10/2021 total cholesterol was 208, triglycerides 215, HDL 57, and LDL 114 (down from 223 previously.  LP(a) was low at 33.4.Nexletol was added to achieve LDL goal < 70. Most recent lipid panel 10/13/22 revealed LDL particle number 334, LDL-C 34, HDL-C 57, triglycerides 208, total cholesterol 123.        History of Present Illness: .   Vanessa Harmon is a 72 y.o. female who is here today for follow-up of dyslipidemia. She reports she is doing well and is not having any problems with her medications, including Nexletol, which was recently added to her regimen. Has been monitoring BP at home, which has been slightly above 130/80, but not reaching 140. Admits that she is not very active. She is limited by back pain and has  undergone five back surgeries and has persistent pain, even with medication. She also has foot pain and is awaiting surgery. She has been doing physical therapy exercises at home, including floor and standing exercises, as well as arm exercises. She reports that her balance is not as good as it could be, especially on days when her allergies are worse. She is anxious about occult blood in her stool and has been waiting to see GI for 2 months. Admits that she has not been especially careful with her diet recently. She denies chest pain, palpitations, shortness of breath, orthopnea, PND, edema, presyncope or syncope.    Discussed the use of AI scribe software for clinical note transcription with the patient, who gave verbal consent to proceed.   ROS: See HPI       Studies Reviewed: .         Risk Assessment/Calculations:     HYPERTENSION CONTROL Vitals:   02/22/23 1355 02/22/23 1417  BP: (!) 142/82 (!) 145/80    The patient's blood pressure is elevated above target today.  In order to address the patient's elevated BP: Blood pressure will be monitored at home to determine if medication changes need to be made.          Physical Exam:   VS:  BP (!) 145/80   Pulse 70   Ht 5\' 6"  (1.676 m)   Wt 174 lb (78.9 kg)   SpO2 99%   BMI 28.08 kg/m    Wt Readings from Last 3 Encounters:  02/22/23 174 lb (78.9 kg)  02/15/23 172 lb (78 kg)  12/13/22 175 lb 6 oz (79.5 kg)    GEN: Well nourished, well developed in no acute distress RESPIRATORY:  No increased work of breathing. No audible wheezing.   PSYCH: Normal affect EXTREMITIES:  No edema; No deformity     ASSESSMENT AND PLAN: .    Dyslipidemia/FH : LDL-C down to 34, LDL particle number 334, HDL-C 57, triglycerides 208 on 10/13/22. Marked improvement in LDL on Nexletol and Repatha. She is tolerating therapy without concerning side effects. Limited physical activity due to back pain and balance issues. Recent diet not optimal. Encouraged  continuation of home PT exercises. Encouraged benefits of a diet rich in whole foods, lean proteins, and low in saturated fats and processed foods. We will repeat NMR and CMET in June 2025. Continue Nexletol and Repatha.  Hypertension: BP is elevated in the office. She reports well-controlled BP at home. Admits she is anxiously awaiting follow-up for blood in her stool. Advised her to continue to monitor for goal BP <  130/80.        Dispo: 1 year with Dr. Rennis Golden or me  Signed, Eligha Bridegroom, NP-C

## 2023-02-21 NOTE — Telephone Encounter (Signed)
Last Fill: 11/29/2022  Next Visit: 05/16/2023  Last Visit: 02/15/2023  Dx: Seronegative rheumatoid arthritis   Current Dose per office note on 02/15/2023: prednisone 5 mg daily   Okay to refill Prednisone?

## 2023-02-22 ENCOUNTER — Ambulatory Visit (HOSPITAL_BASED_OUTPATIENT_CLINIC_OR_DEPARTMENT_OTHER): Payer: Medicare Other | Admitting: Nurse Practitioner

## 2023-02-22 VITALS — BP 145/80 | HR 70 | Ht 66.0 in | Wt 174.0 lb

## 2023-02-22 DIAGNOSIS — E7849 Other hyperlipidemia: Secondary | ICD-10-CM

## 2023-02-22 DIAGNOSIS — M791 Myalgia, unspecified site: Secondary | ICD-10-CM

## 2023-02-22 DIAGNOSIS — I10 Essential (primary) hypertension: Secondary | ICD-10-CM | POA: Diagnosis not present

## 2023-02-22 DIAGNOSIS — T466X5D Adverse effect of antihyperlipidemic and antiarteriosclerotic drugs, subsequent encounter: Secondary | ICD-10-CM | POA: Diagnosis not present

## 2023-02-22 DIAGNOSIS — Z79899 Other long term (current) drug therapy: Secondary | ICD-10-CM

## 2023-02-22 NOTE — Patient Instructions (Signed)
Medication Instructions:   Your physician recommends that you continue on your current medications as directed. Please refer to the Current Medication list given to you today.   *If you need a refill on your cardiac medications before your next appointment, please call your pharmacy*   Lab Work:  Your physician recommends that you return for a FASTING NMR/CMET. In June. Patient was given lab paperwork today.    If you have labs (blood work) drawn today and your tests are completely normal, you will receive your results only by: MyChart Message (if you have MyChart) OR A paper copy in the mail If you have any lab test that is abnormal or we need to change your treatment, we will call you to review the results.   Testing/Procedures:  None ordered.    Follow-Up: At [facility], you and your health needs are our priority.  As part of our continuing mission to provide you with exceptional heart care, we have created designated Provider Care Teams.  These Care Teams include your primary Cardiologist (physician) and Advanced Practice Providers (APPs -  Physician Assistants and Nurse Practitioners) who all work together to provide you with the care you need, when you need it.  We recommend signing up for the patient portal called "MyChart".  Sign up information is provided on this After Visit Summary.  MyChart is used to connect with patients for Virtual Visits (Telemedicine).  Patients are able to view lab/test results, encounter notes, upcoming appointments, etc.  Non-urgent messages can be sent to your provider as well.   To learn more about what you can do with MyChart, go to [email or web address].    Your next appointment:   1 year(s)  Provider:   K. Italy Hilty, MD    Other Instructions  Your physician wants you to follow-up in: 1 year.  You will receive a reminder letter in the mail two months in advance. If you don't receive a letter, please call our office to  schedule the follow-up appointment.

## 2023-02-23 ENCOUNTER — Encounter (HOSPITAL_BASED_OUTPATIENT_CLINIC_OR_DEPARTMENT_OTHER): Payer: Self-pay | Admitting: Nurse Practitioner

## 2023-02-23 ENCOUNTER — Other Ambulatory Visit: Payer: Self-pay | Admitting: Physician Assistant

## 2023-02-27 ENCOUNTER — Ambulatory Visit (INDEPENDENT_AMBULATORY_CARE_PROVIDER_SITE_OTHER): Payer: Medicare Other

## 2023-02-27 DIAGNOSIS — Z23 Encounter for immunization: Secondary | ICD-10-CM

## 2023-03-07 ENCOUNTER — Other Ambulatory Visit: Payer: Self-pay

## 2023-03-07 NOTE — Progress Notes (Signed)
Specialty Pharmacy Refill Coordination Note  Vanessa Harmon is a 72 y.o. female contacted today regarding refills of specialty medication(s) Etanercept   Patient requested Delivery   Delivery date: 03/14/23   Verified address: [address] [place] [address]   Medication will be filled on 03/13/23.

## 2023-03-08 NOTE — Progress Notes (Signed)
03/08/2023 Vanessa Harmon [id number] 19-Nov-1950  Referring provider: Jarold Motto, PA Primary GI doctor: Dr. Lavon Paganini  ASSESSMENT AND PLAN:   Positive FIT (fecal immunochemical test) Previous colonoscopy 2011 unable to see report but supposedly has history of adenomatous polyps at some point in the past Ferritin 38, normal iron Positive FIT, reassuring negative exam today Hemorrhoids on exam Likely positive fit is from hemorrhoids on exam however after discussion with the patient she would like to proceed with colonoscopy to evaluate for occult malignancy We have discussed the risks of bleeding, infection, perforation, medication reactions, and remote risk of death associated with colonoscopy. All questions were answered and the patient acknowledges these risk and wishes to proceed.  Hemorrhoids, unspecified hemorrhoid type -Sitz baths, increase fiber, increase water -Hydrocortisone supp given and external cream sent in.  -We discussed hemorrhoid banding here in the office for internal hemorrhoids if not improving with conservative treatment.  Gastroesophageal reflux disease, unspecified whether esophagitis present No symptoms at this time well-controlled with Protonix 40 mg once daily Avoid NSAIDs, alcohol Call if any worsening symptoms  Seronegative rheumatoid arthritis (HCC) On Enbrel  Constipation secondary to opoids Continue miralax and colace.  - Increase fiber/ water intake, decrease caffeine, increase activity level.   Patient Care Team: Jarold Motto, [place] as PCP - General (Physician Assistant) Donnetta Hail, MD as Consulting Physician (Rheumatology) Maeola Harman, MD as Consulting Physician (Neurosurgery) Manfred Shirts, MD as Referring Physician (Allergy and Immunology) Ernesto Rutherford, MD as Consulting Physician (Ophthalmology) Luciana Axe Alford Highland, MD as Consulting Physician (Retina Ophthalmology) Durene Romans, MD as Consulting Physician (Orthopedic  Surgery) Maxie Barb, MD as Consulting Physician (Nephrology) Erroll Luna, [facility] (Inactive) as Pharmacist (Pharmacist)  HISTORY OF PRESENT ILLNESS: 72 y.o. female with a past medical history of hypertension, asthma, GERD, polyneuropathy, seronegative rheumatoid arthritis, history of SVT, history of PMR and others listed below presents for evaluation of positive FOBT.   03/08/2010 colonoscopy with Dr. Kinnie Scales recall 10 years unable to see the report. 10/26/2022 WBC 11.9, Hgb 11.8, MCV 100.4, platelets 360 11/16/2022 iron 107, ferritin 37, B12 856, A1c 6 02/15/2023 Hgb 14.1, platelets 294, BUN 20, creatinine 1.17 GFR 49, calcium 10.6, albumin 4.8, normal liver function 12/20/2022 positive FOBT  She has been on enbrel for her RA for 4 months.  She she been on the 5 mg prednisone since at least 2016. She is on pepcid for allergies, she is on prilosec 40 mg daily, very rare GERD. Denies dysphagia, nausea, vomiting.  No melena or hematochezia.  She denies rectal pain, itching.  She takes colace twice a day and miralax once a day every day for medication induced constipation which controls it fairly well, she is on hydrocodone 4 x a day.  She is on indomethacin 75 mg once a day.  She denies ETOH.   She  reports that she quit smoking about 26 years ago. Her smoking use included cigarettes. She started smoking about 46 years ago. She has a 20 pack-year smoking history. She has never been exposed to tobacco smoke. She has never used smokeless tobacco. She reports that she does not currently use alcohol. She reports that she does not use drugs.  RELEVANT LABS AND IMAGING:  CBC    Component Value Date/Time   WBC 10.1 02/15/2023 1539   RBC 4.40 02/15/2023 1539   HGB 14.1 02/15/2023 1539   HCT 43.3 02/15/2023 1539   PLT 294 02/15/2023 1539   MCV 98.4 02/15/2023 1539  MCH 32.0 02/15/2023 1539   MCHC 32.6 02/15/2023 1539   RDW 12.6 02/15/2023 1539   LYMPHSABS 1.0 11/16/2022 1406    MONOABS 0.8 11/16/2022 1406   EOSABS 30 02/15/2023 1539   BASOSABS 51 02/15/2023 1539   Recent Labs    03/16/22 1517 06/15/22 1119 09/21/22 1405 10/26/22 1337 11/16/22 1406 02/15/23 1539  HGB 13.6 12.2 12.6 11.8* 12.6 14.1    CMP     Component Value Date/Time   NA 139 02/15/2023 1539   NA 139 04/05/2021 0000   K 4.7 02/15/2023 1539   CL 100 02/15/2023 1539   CO2 28 02/15/2023 1539   GLUCOSE 152 (H) 02/15/2023 1539   BUN 20 02/15/2023 1539   BUN 13 04/05/2021 0000   CREATININE 1.17 (H) 02/15/2023 1539   CALCIUM 10.6 (H) 02/15/2023 1539   PROT 7.1 02/15/2023 1539   ALBUMIN 4.2 10/26/2022 1337   AST 18 02/15/2023 1539   ALT 17 02/15/2023 1539   ALKPHOS 36 (L) 10/26/2022 1337   BILITOT 0.5 02/15/2023 1539   GFRNONAA 63 04/05/2021 0000   GFRNONAA >60 05/27/2020 1333   GFRNONAA 57 (L) 04/13/2017 1201   GFRAA >60 11/27/2019 0244   GFRAA 66 04/13/2017 1201      Latest Ref Rng & Units 02/15/2023    3:39 PM 10/26/2022    1:37 PM 09/21/2022    2:05 PM  Hepatic Function  Total Protein 6.1 - 8.1 g/dL 7.1  6.4  6.4   Albumin 3.5 - 5.2 g/dL  4.2    AST 10 - 35 U/L 18  17  21    ALT 6 - 29 U/L 17  17  25    Alk Phosphatase 39 - 117 U/L  36    Total Bilirubin 0.2 - 1.2 mg/dL 0.5  0.6  0.4       Current Medications:   Current Outpatient Medications (Endocrine & Metabolic):    denosumab (PROLIA) 60 MG/ML SOSY injection, Inject 60 mg into the skin every 6 (six) months. Courier to rheum: [address], [place] [address]. Appt on 11/09/22   predniSONE (DELTASONE) 5 MG tablet, TAKE 1 TABLET (5 MG TOTAL) BY MOUTH DAILY WITH BREAKFAST  Current Outpatient Medications (Cardiovascular):    amLODipine (NORVASC) 10 MG tablet, TAKE ONE TABLET BY MOUTH DAILY   Bempedoic Acid (NEXLETOL) 180 MG TABS, TAKE 1 DOSE BY MOUTH DAILY.   Evolocumab (REPATHA SURECLICK) 140 MG/ML SOAJ, INJECT 1 DOSE INTO THE SKIN EVERY 14 (FOURTEEN) DAYS   ezetimibe (ZETIA) 10 MG tablet, TAKE ONE  TABLET BY MOUTH DAILY   furosemide (LASIX) 20 MG tablet, TAKE ONE TABLET BY MOUTH DAILY   irbesartan (AVAPRO) 300 MG tablet, TAKE ONE TABLET BY MOUTH DAILY  Current Outpatient Medications (Respiratory):    albuterol (PROAIR HFA) 108 (90 Base) MCG/ACT inhaler, Inhale two puffs every 4-6 hours if needed for cough or wheeze.   azelastine (ASTELIN) 0.1 % nasal spray, Place 1 spray into both nostrils 2 (two) times daily. Use in each nostril as directed   budesonide (PULMICORT) 0.5 MG/2ML nebulizer solution, Take by nebulization.   Cetirizine HCl (ZYRTEC ALLERGY) 10 MG CAPS,    diphenhydrAMINE (BENADRYL) 25 MG tablet, Take 50 mg by mouth daily as needed for allergies.   guaiFENesin (MUCINEX) 600 MG 12 hr tablet, Take 600 mg by mouth 2 (two) times daily as needed for cough or to loosen phlegm.   ipratropium (ATROVENT) 0.03 % nasal spray, Place 1 spray into both nostrils 2 (  two) times daily as needed for rhinitis.   montelukast (SINGULAIR) 10 MG tablet, Take 1 tablet (10 mg total) by mouth at bedtime.   pseudoephedrine (SUDAFED) 30 MG tablet, Take 30 mg by mouth every 4 (four) hours as needed for congestion.   SYMBICORT 160-4.5 MCG/ACT inhaler, Inhale 2 puffs into the lungs 2 (two) times daily.  Current Outpatient Medications (Analgesics):    aspirin EC 81 MG tablet, Take 1 tablet (81 mg total) by mouth daily. Swallow whole.   etanercept (ENBREL SURECLICK) 50 MG/ML injection, Inject 50 mg into the skin once a week.   HYDROcodone-acetaminophen (NORCO) 10-325 MG tablet, 1 tablet every 6 (six) hours as needed.   indomethacin (INDOCIN SR) 75 MG CR capsule, TAKE ONE CAPSULE BY MOUTH DAILY WITH BREAKFAST   Current Outpatient Medications (Other):    azithromycin (ZITHROMAX) 250 MG tablet, Take by mouth.   Calcium Carb-Cholecalciferol (CALCIUM 600/VITAMIN D3 PO), Take 1 tablet by mouth daily.   docusate sodium (COLACE) 100 MG capsule, 100 mg 2 (two) times daily.   escitalopram (LEXAPRO) 20 MG tablet, TAKE  ONE TABLET BY MOUTH DAILY   famotidine (PEPCID) 40 MG tablet, TAKE 1 TABLET (40 MG TOTAL) BY MOUTH AT BEDTIME.   gabapentin (NEURONTIN) 300 MG capsule, TAKE ONE CAPSULE BY MOUTH AT BEDTIME   Lifitegrast (XIIDRA) 5 % SOLN, Place 1 drop into both eyes in the morning and at bedtime.    melatonin 3 MG TABS tablet, Take by mouth.   methocarbamol (ROBAXIN) 500 MG tablet, Take 1 tablet (500 mg total) by mouth every 6 (six) hours as needed for muscle spasms.   Multiple Vitamin (MULTIVITAMIN) capsule, Take 1 capsule by mouth daily.   omeprazole (PRILOSEC) 40 MG capsule, Take 1 capsule (40 mg total) by mouth daily. TAKE ONE CAPSULE BY MOUTH DAILY BEFORE BREAKFAST DAILY   OVER THE COUNTER MEDICATION, Place 1 application into both eyes at bedtime. Genteal overnight eye ointment   polyethylene glycol powder (GLYCOLAX/MIRALAX) 17 GM/SCOOP powder, Miralax 17 gram/dose oral powder   PRESCRIPTION MEDICATION, Place 1 spray into the nose in the morning and at bedtime. Budesonide 0.5 mg / 2 mL Nasal rinse   traZODone (DESYREL) 150 MG tablet, TAKE ONE TABLET BY MOUTH DAILY   valACYclovir (VALTREX) 1000 MG tablet, Take 1,000 mg by mouth daily.    vitamin C (ASCORBIC ACID) 500 MG tablet, Take 500 mg by mouth daily.  Medical History:  Past Medical History:  Diagnosis Date   Adenomatous polyp    Allergy    Anxiety    takes Ativan daily as needed   Arthritis    inflammatory arthritis (Dr. Verdie Shire)   Asthma    Cataract    Chronic back pain    scoliosis    Constipation    takes miralax every other day   COVID 05/19/2022   Depression    takes Lexapro daily   Eczema    Essential hypertension, benign    takes Hyzaar and Metoprolol daily   Family history of adverse reaction to anesthesia    Sister PONV   Fibromyalgia    GERD (gastroesophageal reflux disease)    History of colon polyps    History of shingles    Hyperlipidemia    takes Atorvastatin daily   Insomnia    takes Melatonin nightly    Joint pain    Osteoarthritis    Osteoporosis    PSVT (paroxysmal supraventricular tachycardia) (HCC)    Recurrent upper respiratory infection (URI)  Rheumatoid arthritis (HCC)    sees Dr. Dierdre Forth; stable on Orencia   Rhinitis, allergic    uses Flonase daily   Urinary urgency    Urticaria    Allergies:  Allergies  Allergen Reactions   Advair Hfa [Fluticasone-Salmeterol] Other (See Comments)    Hoarness   Statins Other (See Comments)   Breo Ellipta [Fluticasone Furoate-Vilanterol] Other (See Comments)    Severe hoarseness    Fosamax [Alendronate Sodium] Other (See Comments)    myalgias   Levaquin [Levofloxacin] Other (See Comments)    Lightheadedness, not feeling well, ear pain   Infliximab Other (See Comments)    Skin reaction   Spiriva Handihaler [Tiotropium Bromide Monohydrate]     UNSPECIFIED REACTION    Verapamil    Tape Rash    PAPER TAPE: Causes severe rash and weeping     Surgical History:  She  has a past surgical history that includes Knee arthroscopy (Bilateral); Tonsillectomy; neuroma removed from foot; Appendectomy; Breast biopsy; Shoulder surgery (Right); Back surgery (2012); cataract surgery; Colonoscopy; Total hip arthroplasty (Right, 04/15/2014); Adenoidectomy; Blepharoplasty (2018); Posterior lumbar fusion 4 level (N/A, 02/15/2018); Open reduction internal fixation (orif) distal radial fracture (Left, 08/15/2019); Total knee arthroplasty (Left, 11/26/2019); Hardware Removal (Left, 02/24/2020); and Posterior cervical fusion/foraminotomy (N/A, 05/29/2020). Family History:  Her family history includes AAA (abdominal aortic aneurysm) in her father and another family member; Allergic rhinitis in her father and mother; Atrial fibrillation in her brother and mother; COPD in her father; Cancer in an other family member; Hypertension in an other family member; Prostate cancer in her father; Stroke in her mother.  REVIEW OF SYSTEMS  : All other systems reviewed and negative  except where noted in the History of Present Illness.  PHYSICAL EXAM: There were no vitals taken for this visit. General Appearance: Well nourished, in no apparent distress. Head:   Normocephalic and atraumatic. Eyes:  sclerae anicteric,conjunctive pink  Respiratory: Respiratory effort normal, BS equal bilaterally without rales, rhonchi, wheezing. Cardio: RRR with no MRGs. Peripheral pulses intact.  Abdomen: Soft,  Obese ,active bowel sounds. No tenderness . Without guarding and Without rebound. No masses. Rectal: Normal external rectal exam, normal rectal tone, appreciated internal hemorrhoids, non-tender, no masses, , brown stool, hemoccult Negative Musculoskeletal: Full ROM, Normal gait. Without edema. Skin:  Dry and intact without significant lesions or rashes Neuro: Alert and  oriented x4;  No focal deficits. Psych:  Cooperative. Normal mood and affect.    Doree Albee, PA-C 10:46 AM

## 2023-03-10 ENCOUNTER — Ambulatory Visit: Payer: Medicare Other | Admitting: Physician Assistant

## 2023-03-10 ENCOUNTER — Encounter: Payer: Self-pay | Admitting: Physician Assistant

## 2023-03-10 VITALS — BP 120/60 | Ht 66.0 in | Wt 175.0 lb

## 2023-03-10 DIAGNOSIS — M0600 Rheumatoid arthritis without rheumatoid factor, unspecified site: Secondary | ICD-10-CM | POA: Diagnosis not present

## 2023-03-10 DIAGNOSIS — D126 Benign neoplasm of colon, unspecified: Secondary | ICD-10-CM | POA: Diagnosis not present

## 2023-03-10 DIAGNOSIS — K219 Gastro-esophageal reflux disease without esophagitis: Secondary | ICD-10-CM | POA: Diagnosis not present

## 2023-03-10 DIAGNOSIS — K5904 Chronic idiopathic constipation: Secondary | ICD-10-CM | POA: Diagnosis not present

## 2023-03-10 DIAGNOSIS — K649 Unspecified hemorrhoids: Secondary | ICD-10-CM | POA: Diagnosis not present

## 2023-03-10 DIAGNOSIS — R195 Other fecal abnormalities: Secondary | ICD-10-CM | POA: Diagnosis not present

## 2023-03-10 MED ORDER — NA SULFATE-K SULFATE-MG SULF 17.5-3.13-1.6 GM/177ML PO SOLN: ORAL | 0 refills | Status: DC

## 2023-03-10 NOTE — Patient Instructions (Signed)
You have been scheduled for a colonoscopy. Please follow written instructions given to you at your visit today.   Please pick up your prep supplies at the pharmacy within the next 1-3 days.  If you use inhalers (even only as needed), please bring them with you on the day of your procedure.  DO NOT TAKE 7 DAYS PRIOR TO TEST- Trulicity (dulaglutide) Ozempic, Wegovy (semaglutide) Mounjaro (tirzepatide) Bydureon Bcise (exanatide extended release)  DO NOT TAKE 1 DAY PRIOR TO YOUR TEST Rybelsus (semaglutide) Adlyxin (lixisenatide) Victoza (liraglutide) Byetta (exanatide) ___________________________________________________________________________   We have sent the following medications to your pharmacy for you to pick up at your convenience: Suprep

## 2023-03-14 ENCOUNTER — Other Ambulatory Visit: Payer: Self-pay

## 2023-03-14 MED ORDER — NEXLETOL 180 MG PO TABS: 180.0000 mg | ORAL_TABLET | Freq: Every day | ORAL | 3 refills | Status: DC

## 2023-03-22 ENCOUNTER — Other Ambulatory Visit: Payer: Self-pay | Admitting: Physician Assistant

## 2023-03-22 NOTE — Telephone Encounter (Signed)
Chart supports rx. Last OV: 12/13/2022 Next OV: 05/01/2023

## 2023-03-23 ENCOUNTER — Encounter: Payer: Self-pay | Admitting: Gastroenterology

## 2023-03-30 ENCOUNTER — Other Ambulatory Visit: Payer: Self-pay | Admitting: Physician Assistant

## 2023-03-30 ENCOUNTER — Other Ambulatory Visit: Payer: Self-pay

## 2023-03-30 NOTE — Progress Notes (Signed)
Specialty Pharmacy Refill Coordination Note  Vanessa Harmon is a 72 y.o. female contacted today regarding refills of specialty medication(s) Etanercept (Enbrel SureClick)   Patient requested Delivery   Delivery date: 04/11/23   Verified address: [address] [place] [address]   Medication will be filled on 04/10/23.

## 2023-04-01 ENCOUNTER — Encounter: Payer: Self-pay | Admitting: Certified Registered Nurse Anesthetist

## 2023-04-03 ENCOUNTER — Ambulatory Visit: Payer: Medicare Other | Admitting: Gastroenterology

## 2023-04-03 ENCOUNTER — Encounter: Payer: Self-pay | Admitting: Gastroenterology

## 2023-04-03 VITALS — BP 116/65 | HR 74 | Temp 97.7°F | Resp 20 | Ht 66.0 in | Wt 175.0 lb

## 2023-04-03 DIAGNOSIS — D123 Benign neoplasm of transverse colon: Secondary | ICD-10-CM

## 2023-04-03 DIAGNOSIS — K648 Other hemorrhoids: Secondary | ICD-10-CM

## 2023-04-03 DIAGNOSIS — Z1211 Encounter for screening for malignant neoplasm of colon: Secondary | ICD-10-CM | POA: Diagnosis not present

## 2023-04-03 DIAGNOSIS — K644 Residual hemorrhoidal skin tags: Secondary | ICD-10-CM | POA: Diagnosis not present

## 2023-04-03 DIAGNOSIS — R195 Other fecal abnormalities: Secondary | ICD-10-CM

## 2023-04-03 DIAGNOSIS — K635 Polyp of colon: Secondary | ICD-10-CM | POA: Diagnosis not present

## 2023-04-03 DIAGNOSIS — K5730 Diverticulosis of large intestine without perforation or abscess without bleeding: Secondary | ICD-10-CM | POA: Diagnosis not present

## 2023-04-03 DIAGNOSIS — J45909 Unspecified asthma, uncomplicated: Secondary | ICD-10-CM | POA: Diagnosis not present

## 2023-04-03 DIAGNOSIS — M797 Fibromyalgia: Secondary | ICD-10-CM | POA: Diagnosis not present

## 2023-04-03 MED ORDER — SODIUM CHLORIDE 0.9 % IV SOLN: 500.0000 mL | Freq: Once | INTRAVENOUS | Status: DC

## 2023-04-03 NOTE — Op Note (Signed)
[facility] Patient Name: Vanessa Harmon Procedure Date: 04/03/2023 2:09 PM MRN: [id number] Endoscopist: Napoleon Form , MD, [id number] Age: 72 Referring MD:  Date of Birth: 1951-02-25 Gender: Female Account #: [id number] Procedure:                Colonoscopy Indications:              Positive Cologuard test Medicines:                Monitored Anesthesia Care Procedure:                Pre-Anesthesia Assessment:                           - Prior to the procedure, a History and Physical                            was performed, and patient medications and                            allergies were reviewed. The patient's tolerance of                            previous anesthesia was also reviewed. The risks                            and benefits of the procedure and the sedation                            options and risks were discussed with the patient.                            All questions were answered, and informed consent                            was obtained. Prior Anticoagulants: The patient has                            taken no anticoagulant or antiplatelet agents. ASA                            Grade Assessment: II - A patient with mild systemic                            disease. After reviewing the risks and benefits,                            the patient was deemed in satisfactory condition to                            undergo the procedure.                           After obtaining informed consent, the colonoscope  was passed under direct vision. Throughout the                            procedure, the patient's blood pressure, pulse, and                            oxygen saturations were monitored continuously. The                            Olympus Scope [id number] was introduced through the                            anus and advanced to the the cecum, identified by                            appendiceal orifice and  ileocecal valve. The                            colonoscopy was performed without difficulty. The                            patient tolerated the procedure well. The quality                            of the bowel preparation was adequate. The                            ileocecal valve, appendiceal orifice, and rectum                            were photographed. Scope In: 2:20:40 PM Scope Out: 2:40:32 PM Scope Withdrawal Time: 0 hours 14 minutes 59 seconds  Total Procedure Duration: 0 hours 19 minutes 52 seconds  Findings:                 The perianal and digital rectal examinations were                            normal.                           Two sessile polyps were found in the transverse                            colon. The polyps were 5 to 7 mm in size. These                            polyps were removed with a cold snare. Resection                            and retrieval were complete.                           A few small-mouthed diverticula were found in the  sigmoid colon.                           Non-bleeding external and internal hemorrhoids were                            found during retroflexion. The hemorrhoids were                            medium-sized. Complications:            No immediate complications. Estimated Blood Loss:     Estimated blood loss was minimal. Impression:               - Two 5 to 7 mm polyps in the transverse colon,                            removed with a cold snare. Resected and retrieved.                           - Diverticulosis in the sigmoid colon.                           - Non-bleeding external and internal hemorrhoids. Recommendation:           - Patient has a contact number available for                            emergencies. The signs and symptoms of potential                            delayed complications were discussed with the                            patient. Return to normal activities  tomorrow.                            Written discharge instructions were provided to the                            patient.                           - Resume previous diet.                           - Continue present medications.                           - Await pathology results.                           - Repeat colonoscopy in 5 years for surveillance. Napoleon Form, MD 04/03/2023 2:51:25 PM This report has been signed electronically.

## 2023-04-03 NOTE — Patient Instructions (Addendum)
Resume previous diet Continue present medications Await pathology results Repeat colonoscopy in 5 years Handouts/information given for polyps, diverticulosis and hemorrhoids  YOU HAD AN ENDOSCOPIC PROCEDURE TODAY AT THE [facility]:   Refer to the procedure report that was given to you for any specific questions about what was found during the examination.  If the procedure report does not answer your questions, please call your gastroenterologist to clarify.  If you requested that your care partner not be given the details of your procedure findings, then the procedure report has been included in a sealed envelope for you to review at your convenience later.  YOU SHOULD EXPECT: Some feelings of bloating in the abdomen. Passage of more gas than usual.  Walking can help get rid of the air that was put into your GI tract during the procedure and reduce the bloating. If you had a lower endoscopy (such as a colonoscopy or flexible sigmoidoscopy) you may notice spotting of blood in your stool or on the toilet paper. If you underwent a bowel prep for your procedure, you may not have a normal bowel movement for a few days.  Please Note:  You might notice some irritation and congestion in your nose or some drainage.  This is from the oxygen used during your procedure.  There is no need for concern and it should clear up in a day or so.  SYMPTOMS TO REPORT IMMEDIATELY:  Following lower endoscopy (colonoscopy):  Excessive amounts of blood in the stool  Significant tenderness or worsening of abdominal pains  Swelling of the abdomen that is new, acute  Fever of 100F or higher  For urgent or emergent issues, a gastroenterologist can be reached at any hour by calling [phone]. Do not use MyChart messaging for urgent concerns.    DIET:  We do recommend a small meal at first, but then you may proceed to your regular diet.  Drink plenty of fluids but you should avoid alcoholic beverages  for 24 hours.  ACTIVITY:  You should plan to take it easy for the rest of today and you should NOT DRIVE or use heavy machinery until tomorrow (because of the sedation medicines used during the test).    FOLLOW UP: Our staff will call the number listed on your records the next business day following your procedure.  We will call around 7:15- 8:00 am to check on you and address any questions or concerns that you may have regarding the information given to you following your procedure. If we do not reach you, we will leave a message.     If any biopsies were taken you will be contacted by phone or by letter within the next 1-3 weeks.  Please call us at [phone] if you have not heard about the biopsies in 3 weeks.    SIGNATURES/CONFIDENTIALITY: You and/or your care partner have signed paperwork which will be entered into your electronic medical record.  These signatures attest to the fact that that the information above on your After Visit Summary has been reviewed and is understood.  Full responsibility of the confidentiality of this discharge information lies with you and/or your care-partner.

## 2023-04-03 NOTE — Progress Notes (Unsigned)
[facility] History and Physical   Primary Care Physician:  Jarold Motto, [place]   Reason for Procedure:  Positive FIT  Plan:    colonoscopy with possible interventions as needed     HPI: Vanessa Harmon is a very pleasant 72 y.o. female here for colonoscopy for positive FIT.   The risks and benefits as well as alternatives of endoscopic procedure(s) have been discussed and reviewed. All questions answered. The patient agrees to proceed.    Past Medical History:  Diagnosis Date   Adenomatous polyp    Allergy    Anxiety    takes Ativan daily as needed   Arthritis    inflammatory arthritis (Dr. Verdie Shire)   Asthma    Cataract    Chronic back pain    scoliosis    Constipation    takes miralax every other day   COVID 05/19/2022   Depression    takes Lexapro daily   Eczema    Essential hypertension, benign    takes Hyzaar and Metoprolol daily   Family history of adverse reaction to anesthesia    Sister PONV   Fibromyalgia    GERD (gastroesophageal reflux disease)    History of colon polyps    History of shingles    Hyperlipidemia    takes Atorvastatin daily   Insomnia    takes Melatonin nightly   Joint pain    Osteoarthritis    Osteoporosis    PSVT (paroxysmal supraventricular tachycardia) (HCC)    Recurrent upper respiratory infection (URI)    Rheumatoid arthritis (HCC)    sees Dr. Dierdre Forth; stable on Orencia   Rhinitis, allergic    uses Flonase daily   Urinary urgency    Urticaria     Past Surgical History:  Procedure Laterality Date   ADENOIDECTOMY     APPENDECTOMY     BACK SURGERY  2012   at age 37 d/t scoliosis   BLEPHAROPLASTY  2018   BREAST BIOPSY     cataract surgery     COLONOSCOPY     HARDWARE REMOVAL Left 02/24/2020   Procedure: HARDWARE REMOVAL LEFT DISTAL RADIUS;  Surgeon: Betha Loa, MD;  Location: [facility];  Service: Orthopedics;  Laterality: Left;   KNEE ARTHROSCOPY Bilateral    neuroma  removed from foot     unsure of which foot   OPEN REDUCTION INTERNAL FIXATION (ORIF) DISTAL RADIAL FRACTURE Left 08/15/2019   Procedure: OPEN REDUCTION INTERNAL FIXATION (ORIF) DISTAL RADIAL FRACTURE;  Surgeon: Betha Loa, MD;  Location: [facility];  Service: Orthopedics;  Laterality: Left;  block in preop   POSTERIOR CERVICAL FUSION/FORAMINOTOMY N/A 05/29/2020   Procedure: Removal of hardware, Replacement of thoracic 8, thoracic 9, thoracic 10 screws, Right Thoracic 6-7, Right Thoracic 7-8 Laminectomies with thoracic hooks from Thoracic 4-5 to Thoracic 10;  Surgeon: Maeola Harman, MD;  Location: [facility] OR;  Service: Neurosurgery;  Laterality: N/A;   POSTERIOR LUMBAR FUSION 4 LEVEL N/A 02/15/2018   Procedure: Decompression and fusion Thoracic nine to Lumbar two with exploration of previous fusion;  Surgeon: Maeola Harman, MD;  Location: [facility] OR;  Service: Neurosurgery;  Laterality: N/A;   SHOULDER SURGERY Right    TONSILLECTOMY     TOTAL HIP ARTHROPLASTY Right 04/15/2014   Procedure: RIGHT TOTAL HIP ARTHROPLASTY ANTERIOR APPROACH;  Surgeon: Sheral Apley, MD;  Location: [facility] OR;  Service: Orthopedics;  Laterality: Right;   TOTAL KNEE ARTHROPLASTY Left 11/26/2019   Procedure: TOTAL KNEE ARTHROPLASTY;  Surgeon:  Durene Romans, MD;  Location: [facility]S;  Service: Orthopedics;  Laterality: Left;  70 mins    Prior to Admission medications   Medication Sig Start Date End Date Taking? Authorizing Provider  amLODipine (NORVASC) 10 MG tablet TAKE ONE TABLET BY MOUTH DAILY 02/24/23  Yes Jarold Motto, PA  aspirin EC 81 MG tablet Take 1 tablet (81 mg total) by mouth daily. Swallow whole. 06/05/20  Yes Costella, Darci Current, PA-C  azelastine (ASTELIN) 0.1 % nasal spray Place 1 spray into both nostrils 2 (two) times daily. Use in each nostril as directed   Yes [provider]  azithromycin (ZITHROMAX) 250 MG tablet Take by mouth. 11/09/22 11/09/23 Yes [provider]  Bempedoic Acid  (NEXLETOL) 180 MG TABS Take 1 tablet (180 mg total) by mouth daily. 03/14/23  Yes Hilty, Lisette Abu, MD  budesonide (PULMICORT) 0.5 MG/2ML nebulizer solution Take by nebulization. 01/31/22  Yes [provider]  Calcium Carb-Cholecalciferol (CALCIUM 600/VITAMIN D3 PO) Take 1 tablet by mouth daily.   Yes [provider]  Cetirizine HCl (ZYRTEC ALLERGY) 10 MG CAPS    Yes [provider]  diphenhydrAMINE (BENADRYL) 25 MG tablet Take 50 mg by mouth daily as needed for allergies.   Yes [provider]  docusate sodium (COLACE) 100 MG capsule 100 mg 2 (two) times daily.   Yes [provider]  escitalopram (LEXAPRO) 20 MG tablet TAKE ONE TABLET BY MOUTH DAILY 02/01/23  Yes Bufford Buttner, [place], PA  ezetimibe (ZETIA) 10 MG tablet TAKE ONE TABLET BY MOUTH DAILY 01/19/23  Yes Jarold Motto, PA  famotidine (PEPCID) 40 MG tablet TAKE 1 TABLET (40 MG TOTAL) BY MOUTH AT BEDTIME. 11/21/18  Yes Kozlow, Alvira Philips, MD  furosemide (LASIX) 20 MG tablet TAKE ONE TABLET BY MOUTH DAILY 01/18/23  Yes Jarold Motto, PA  gabapentin (NEURONTIN) 300 MG capsule TAKE ONE CAPSULE BY MOUTH AT BEDTIME 01/18/23  Yes Jarold Motto, PA  HYDROcodone-acetaminophen (NORCO) 10-325 MG tablet 1 tablet every 6 (six) hours as needed.   Yes [provider]  indomethacin (INDOCIN SR) 75 MG CR capsule TAKE ONE CAPSULE BY MOUTH DAILY WITH BREAKFAST 01/26/23  Yes Worley, [place], PA  ipratropium (ATROVENT) 0.03 % nasal spray Place 1 spray into both nostrils 2 (two) times daily as needed for rhinitis.   Yes [provider]  irbesartan (AVAPRO) 300 MG tablet TAKE ONE TABLET BY MOUTH DAILY 03/30/23  Yes Bufford Buttner, [place], PA  Lifitegrast Benay Spice) 5 % SOLN Place 1 drop into both eyes in the morning and at bedtime.    Yes [provider]  melatonin 3 MG TABS tablet Take by mouth. 05/16/17  Yes [provider]  methocarbamol (ROBAXIN) 500 MG tablet Take 1 tablet (500 mg total) by  mouth every 6 (six) hours as needed for muscle spasms. 05/31/20  Yes Costella, Darci Current, PA-C  montelukast (SINGULAIR) 10 MG tablet Take 1 tablet (10 mg total) by mouth at bedtime. 06/19/18  Yes Kozlow, Alvira Philips, MD  Multiple Vitamin (MULTIVITAMIN) capsule Take 1 capsule by mouth daily.   Yes [provider]  omeprazole (PRILOSEC) 40 MG capsule Take 1 capsule (40 mg total) by mouth daily. TAKE ONE CAPSULE BY MOUTH DAILY BEFORE BREAKFAST DAILY 10/27/22  Yes Bufford Buttner, [place], PA  OVER THE COUNTER MEDICATION Place 1 application into both eyes at bedtime. Genteal overnight eye ointment   Yes [provider]  polyethylene glycol powder (GLYCOLAX/MIRALAX) 17 GM/SCOOP powder Miralax 17 gram/dose oral powder   Yes [provider]  predniSONE (DELTASONE) 5 MG tablet TAKE 1 TABLET (5 MG TOTAL) BY MOUTH DAILY WITH BREAKFAST 02/22/23  Yes Rice, Jamesetta Orleans, MD  PRESCRIPTION MEDICATION Place 1 spray into the nose in the morning and at bedtime. Budesonide 0.5 mg / 2 mL Nasal rinse   Yes [provider]  pseudoephedrine (SUDAFED) 30 MG tablet Take 30 mg by mouth every 4 (four) hours as needed for congestion.   Yes [provider]  SYMBICORT 160-4.5 MCG/ACT inhaler Inhale 2 puffs into the lungs 2 (two) times daily.   Yes [provider]  traZODone (DESYREL) 150 MG tablet TAKE ONE TABLET BY MOUTH DAILY 03/22/23  Yes Jarold Motto, PA  valACYclovir (VALTREX) 1000 MG tablet Take 1,000 mg by mouth daily.  09/02/16  Yes [provider]  vitamin C (ASCORBIC ACID) 500 MG tablet Take 500 mg by mouth daily.   Yes [provider]  albuterol (PROAIR HFA) 108 (90 Base) MCG/ACT inhaler Inhale two puffs every 4-6 hours if needed for cough or wheeze. 03/26/18   Kozlow, Alvira Philips, MD  denosumab (PROLIA) 60 MG/ML SOSY injection Inject 60 mg into the skin every 6 (six) months. Courier to rheum: [address], [address], [place] [address]. Appt on 11/09/22 11/03/22    Fuller Plan, MD  etanercept (ENBREL SURECLICK) 50 MG/ML injection Inject 50 mg into the skin once a week. 02/09/23   Rice, Jamesetta Orleans, MD  Evolocumab (REPATHA SURECLICK) 140 MG/ML SOAJ INJECT 1 DOSE INTO THE SKIN EVERY 14 (FOURTEEN) DAYS 08/19/22   Hilty, Lisette Abu, MD  guaiFENesin (MUCINEX) 600 MG 12 hr tablet Take 600 mg by mouth 2 (two) times daily as needed for cough or to loosen phlegm.    [provider]    Current Outpatient Medications  Medication Sig Dispense Refill   amLODipine (NORVASC) 10 MG tablet TAKE ONE TABLET BY MOUTH DAILY 90 tablet 0   aspirin EC 81 MG tablet Take 1 tablet (81 mg total) by mouth daily. Swallow whole. 30 tablet 11   azelastine (ASTELIN) 0.1 % nasal spray Place 1 spray into both nostrils 2 (two) times daily. Use in each nostril as directed     azithromycin (ZITHROMAX) 250 MG tablet Take by mouth.     Bempedoic Acid (NEXLETOL) 180 MG TABS Take 1 tablet (180 mg total) by mouth daily. 90 tablet 3   budesonide (PULMICORT) 0.5 MG/2ML nebulizer solution Take by nebulization.     Calcium Carb-Cholecalciferol (CALCIUM 600/VITAMIN D3 PO) Take 1 tablet by mouth daily.     Cetirizine HCl (ZYRTEC ALLERGY) 10 MG CAPS      diphenhydrAMINE (BENADRYL) 25 MG tablet Take 50 mg by mouth daily as needed for allergies.     docusate sodium (COLACE) 100 MG capsule 100 mg 2 (two) times daily.     escitalopram (LEXAPRO) 20 MG tablet TAKE ONE TABLET BY MOUTH DAILY 90 tablet 0   ezetimibe (ZETIA) 10 MG tablet TAKE ONE TABLET BY MOUTH DAILY 90 tablet 1   famotidine (PEPCID) 40 MG tablet TAKE 1 TABLET (40 MG TOTAL) BY MOUTH AT BEDTIME. 30 tablet 5   furosemide (LASIX) 20 MG tablet TAKE ONE TABLET BY MOUTH DAILY 90 tablet 0   gabapentin (NEURONTIN) 300 MG capsule TAKE ONE CAPSULE BY MOUTH AT BEDTIME 90 capsule 2   HYDROcodone-acetaminophen (NORCO) 10-325 MG tablet 1 tablet every 6 (six) hours as needed.     indomethacin (INDOCIN SR) 75 MG CR capsule TAKE ONE CAPSULE BY  MOUTH DAILY WITH BREAKFAST 90 capsule 0   ipratropium (ATROVENT) 0.03 % nasal spray Place 1 spray into both nostrils 2 (two) times daily as needed for rhinitis.     irbesartan (AVAPRO) 300 MG tablet TAKE ONE TABLET BY MOUTH DAILY 90 tablet 0   Lifitegrast (XIIDRA) 5 % SOLN Place 1 drop into both eyes in the morning and at bedtime.      melatonin 3 MG TABS tablet Take by mouth.     methocarbamol (ROBAXIN) 500 MG tablet Take 1 tablet (500 mg total) by mouth every 6 (six) hours as needed for muscle spasms. 60 tablet 2   montelukast (SINGULAIR) 10 MG tablet Take 1 tablet (10 mg total) by mouth at bedtime. 30 tablet 5   Multiple Vitamin (MULTIVITAMIN) capsule Take 1 capsule by mouth daily.     omeprazole (PRILOSEC) 40 MG capsule Take 1 capsule (40 mg total) by mouth daily. TAKE ONE CAPSULE BY MOUTH DAILY BEFORE BREAKFAST DAILY 90 capsule 3   OVER THE COUNTER MEDICATION Place 1 application into both eyes at bedtime. Genteal overnight eye ointment     polyethylene glycol powder (GLYCOLAX/MIRALAX) 17 GM/SCOOP powder Miralax 17 gram/dose oral powder     predniSONE (DELTASONE) 5 MG tablet TAKE 1 TABLET (5 MG TOTAL) BY MOUTH DAILY WITH BREAKFAST 90 tablet 0   PRESCRIPTION MEDICATION Place 1 spray into the nose in the morning and at bedtime. Budesonide 0.5 mg / 2 mL Nasal rinse     pseudoephedrine (SUDAFED) 30 MG tablet Take 30 mg by mouth every 4 (four) hours as needed for congestion.     SYMBICORT 160-4.5 MCG/ACT inhaler Inhale 2 puffs into the lungs 2 (two) times daily.     traZODone (DESYREL) 150 MG tablet TAKE ONE TABLET BY MOUTH DAILY 90 tablet 0   valACYclovir (VALTREX) 1000 MG tablet Take 1,000 mg by mouth daily.      vitamin C (ASCORBIC ACID) 500 MG tablet Take 500 mg by mouth daily.     albuterol (PROAIR HFA) 108 (90 Base) MCG/ACT inhaler Inhale two puffs every 4-6 hours if needed for cough or wheeze. 8.5 Inhaler 1   denosumab (PROLIA) 60 MG/ML SOSY injection Inject 60 mg into the skin every 6 (six)  months. Courier to rheum: [address], [place] [address]. Appt on 11/09/22 1 mL 0   etanercept (ENBREL SURECLICK) 50 MG/ML injection Inject 50 mg into the skin once a week. 12 mL 0   Evolocumab (REPATHA SURECLICK) 140 MG/ML SOAJ INJECT 1 DOSE INTO THE SKIN EVERY 14 (FOURTEEN) DAYS 6 mL 3   guaiFENesin (MUCINEX) 600 MG 12 hr tablet Take 600 mg by mouth 2 (two) times daily as needed for cough or to loosen phlegm.     Current Facility-Administered Medications  Medication Dose Route Frequency Provider Last Rate Last Admin   0.9 %  sodium chloride infusion  500 mL Intravenous Once Napoleon Form, MD        Allergies as of 04/03/2023 - Review Complete 04/03/2023  Allergen Reaction Noted   Advair hfa [fluticasone-salmeterol] Other (See Comments) 07/22/2020   Statins Other (See Comments) 01/27/2020   Breo ellipta [fluticasone furoate-vilanterol] Other (See Comments) 03/07/2016   Fosamax [alendronate sodium] Other (See Comments) 02/06/2019   Levaquin [levofloxacin] Other (See Comments) 02/02/2018   Infliximab Other (See Comments) 08/09/2019   Spiriva handihaler [tiotropium bromide monohydrate]  03/23/2017   Verapamil  02/06/2019   Tape Rash 11/18/2013    Family History  Problem Relation  Age of Onset   Atrial fibrillation Mother    Stroke Mother    Allergic rhinitis Mother    Allergic rhinitis Father    AAA (abdominal aortic aneurysm) Father    COPD Father    Prostate cancer Father    Atrial fibrillation Brother    Stomach cancer Paternal Grandmother        some sort of abdominal cancer   Hypertension Other        unspecified grandmother   AAA (abdominal aortic aneurysm) Other        unspecified grandfather   Cancer Other        unspecified grandfather   Neuropathy Neg Hx    Colon cancer Neg Hx    Esophageal cancer Neg Hx    Rectal cancer Neg Hx     Social History   Socioeconomic History   Marital status: Married    Spouse name: Not on file   Number of  children: Not on file   Years of education: Not on file   Highest education level: Bachelor's degree (e.g., BA, AB, BS)  Occupational History   Not on file  Tobacco Use   Smoking status: Former    Current packs/day: 0.00    Average packs/day: 1 pack/day for 20.0 years (20.0 ttl pk-yrs)    Types: Cigarettes    Start date: 56    Quit date: 51    Years since quitting: 26.9    Passive exposure: Never   Smokeless tobacco: Never  Vaping Use   Vaping status: Never Used  Substance and Sexual Activity   Alcohol use: Not Currently    Comment: rarely with dinner   Drug use: No   Sexual activity: Yes    Birth control/protection: Post-menopausal  Other Topics Concern   Not on file  Social History Narrative   Retired Engineer, civil (consulting)   No children   Lives with husband   Social Drivers of Health   Financial Resource Strain: Low Risk  (09/28/2022)   Overall Financial Resource Strain (CARDIA)    Difficulty of Paying Living Expenses: Not hard at all  Food Insecurity: No Food Insecurity (09/28/2022)   Hunger Vital Sign    Worried About Running Out of Food in the Last Year: Never true    Ran Out of Food in the Last Year: Never true  Transportation Needs: No Transportation Needs (09/28/2022)   PRAPARE - Administrator, Civil Service (Medical): No    Lack of Transportation (Non-Medical): No  Physical Activity: Unknown (09/28/2022)   Exercise Vital Sign    Days of Exercise per Week: 0 days    Minutes of Exercise per Session: Not on file  Stress: No Stress Concern Present (09/28/2022)   [employer] of Occupational Health - Occupational Stress Questionnaire    Feeling of Stress : Not at all  Social Connections: Unknown (09/28/2022)   Social Connection and Isolation Panel [NHANES]    Frequency of Communication with Friends and Family: Twice a week    Frequency of Social Gatherings with Friends and Family: Patient declined    Attends Religious Services: Never    Database administrator  or Organizations: No    Attends Engineer, structural: Not on file    Marital Status: Married  Catering manager Violence: Not At Risk (07/08/2021)   Humiliation, Afraid, Rape, and Kick questionnaire    Fear of Current or Ex-Partner: No    Emotionally Abused: No    Physically Abused: No  Sexually Abused: No    Review of Systems:  All other review of systems negative except as mentioned in the HPI.  Physical Exam: Vital signs in last 24 hours: BP (!) 145/74   Pulse 78   Temp 97.7 F (36.5 C)   Resp 10   Ht 5\' 6"  (1.676 m)   Wt 175 lb (79.4 kg)   SpO2 100%   BMI 28.25 kg/m  General:   Alert, NAD Lungs:  Clear .   Heart:  Regular rate and rhythm Abdomen:  Soft, nontender and nondistended. Neuro/Psych:  Alert and cooperative. Normal mood and affect. A and O x 3  Reviewed labs, radiology imaging, old records and pertinent past GI work up  Patient is appropriate for planned procedure(s) and anesthesia in an ambulatory setting   K. Scherry Ran , MD [phone]

## 2023-04-03 NOTE — Progress Notes (Signed)
Called to room to assist during endoscopic procedure.  Patient ID and intended procedure confirmed with present staff. Received instructions for my participation in the procedure from the performing physician.  

## 2023-04-03 NOTE — Progress Notes (Unsigned)
Report given to PACU, vss 

## 2023-04-04 ENCOUNTER — Telehealth: Payer: Self-pay | Admitting: *Deleted

## 2023-04-04 NOTE — Telephone Encounter (Signed)
Attempted f/u phone call. No answer. Left message. °

## 2023-04-05 ENCOUNTER — Encounter: Payer: Self-pay | Admitting: Gastroenterology

## 2023-04-07 LAB — SURGICAL PATHOLOGY

## 2023-04-10 ENCOUNTER — Other Ambulatory Visit: Payer: Self-pay

## 2023-04-17 ENCOUNTER — Other Ambulatory Visit (HOSPITAL_COMMUNITY): Payer: Self-pay

## 2023-04-17 ENCOUNTER — Other Ambulatory Visit: Payer: Self-pay | Admitting: Physician Assistant

## 2023-04-17 ENCOUNTER — Telehealth: Payer: Self-pay | Admitting: Pharmacy Technician

## 2023-04-17 NOTE — Telephone Encounter (Signed)
Pharmacy Patient Advocate Encounter  Received notification from [facility] that Prior Authorization for nexletol has been APPROVED from 04/17/23 to 10/16/23   PA #/Case ID/Reference #: [id number]

## 2023-04-17 NOTE — Telephone Encounter (Signed)
Pharmacy Patient Advocate Encounter   Received notification from CoverMyMeds that prior authorization for nexletol is required/requested.   Insurance verification completed.   The patient is insured through [facility] .   Per test claim: PA required; PA submitted to above mentioned insurance via CoverMyMeds Key/confirmation #/EOC [facility] Status is pending

## 2023-04-25 ENCOUNTER — Other Ambulatory Visit: Payer: Self-pay | Admitting: Physician Assistant

## 2023-04-27 ENCOUNTER — Other Ambulatory Visit: Payer: Self-pay | Admitting: Internal Medicine

## 2023-04-27 ENCOUNTER — Other Ambulatory Visit: Payer: Self-pay

## 2023-04-27 DIAGNOSIS — M0600 Rheumatoid arthritis without rheumatoid factor, unspecified site: Secondary | ICD-10-CM

## 2023-04-27 DIAGNOSIS — Z79899 Other long term (current) drug therapy: Secondary | ICD-10-CM

## 2023-04-27 NOTE — Telephone Encounter (Signed)
 Last Fill: 04/11/2023  Labs: 02/15/2023 Glucose 152 Creat 1.17 eGFR 49 Calcium  10.6 Neutro Abs 8,535  TB Gold: 11/21/2022 Negative   Next Visit: 05/16/2023  Last Visit: 02/15/2023  IK:Dzmnwzhjupcz rheumatoid arthritis   Current Dose per office note 02/15/2023: Enbrel  50 mg subcu weekly   Okay to refill Enbrel ?

## 2023-04-27 NOTE — Progress Notes (Signed)
 Specialty Pharmacy Ongoing Clinical Assessment Note  Vanessa Harmon is a 73 y.o. female who is being followed by the specialty pharmacy service for RxSp Rheumatoid Arthritis   Patient's specialty medication(s) reviewed today: Etanercept  (Enbrel  SureClick)   Missed doses in the last 4 weeks: 0   Patient/Caregiver did not have any additional questions or concerns.   Therapeutic benefit summary: Patient is achieving benefit   Adverse events/side effects summary: No adverse events/side effects   Patient's therapy is appropriate to: Continue    Goals Addressed             This Visit's Progress    Minimize recurrence of flares       Patient is on track. Patient will maintain adherence         Follow up:  6 months  Mitzie GORMAN [facility] Specialty Pharmacist

## 2023-04-27 NOTE — Progress Notes (Signed)
 Specialty Pharmacy Refill Coordination Note  Vanessa Harmon is a 73 y.o. female contacted today regarding refills of specialty medication(s) Etanercept  (Enbrel  SureClick)   Patient requested Delivery   Delivery date: 05/11/23   Verified address: [address]   Medication will be filled on 05/10/23.

## 2023-04-28 ENCOUNTER — Other Ambulatory Visit: Payer: Self-pay

## 2023-04-28 MED ORDER — ENBREL SURECLICK 50 MG/ML ~~LOC~~ SOAJ
50.0000 mg | SUBCUTANEOUS | 2 refills | Status: DC
  Filled 2023-04-28 – 2023-05-15 (×2): qty 4, 28d supply, fill #0
  Filled 2023-06-06: qty 4, 28d supply, fill #1
  Filled 2023-06-27 (×2): qty 4, 28d supply, fill #2

## 2023-05-01 ENCOUNTER — Telehealth: Payer: Self-pay | Admitting: Pharmacist

## 2023-05-01 ENCOUNTER — Ambulatory Visit: Payer: Medicare Other | Admitting: Physician Assistant

## 2023-05-01 ENCOUNTER — Other Ambulatory Visit: Payer: Self-pay

## 2023-05-01 NOTE — Telephone Encounter (Signed)
 Submitted a Prior Authorization request to [employer] for ENBREL  via CoverMyMeds. Will update once we receive a response.  Key: [id number]   Per automated response: This medication or product was previously approved on [id number] from 2023-04-19 to 2024-04-17. Providers contact us  at [phone] for further assistance.  Sherry Pennant, PharmD, MPH, BCPS, CPP Clinical Pharmacist (Rheumatology and Pulmonology)

## 2023-05-03 ENCOUNTER — Other Ambulatory Visit: Payer: Self-pay | Admitting: Physician Assistant

## 2023-05-03 DIAGNOSIS — M25512 Pain in left shoulder: Secondary | ICD-10-CM | POA: Diagnosis not present

## 2023-05-03 DIAGNOSIS — M25511 Pain in right shoulder: Secondary | ICD-10-CM | POA: Diagnosis not present

## 2023-05-05 NOTE — Progress Notes (Signed)
Office Visit Note  Patient: Vanessa Harmon             Date of Birth: 17-Dec-1950           MRN: [id number]             PCP: Jarold Motto, PA Referring: Jarold Motto, PA Visit Date: 05/16/2023   Subjective:  Follow-up    Discussed the use of AI scribe software for clinical note transcription with the patient, who gave verbal consent to proceed.  History of Present Illness   Vanessa Harmon is a 73 y.o. female here for follow up for seronegative RA on Enbrel 50 mg weekly, prednisone 5 mg daily, and indomethacin 75 mg daily.  They have been managing their rheumatoid arthritis with Enbrel, which they have been able to obtain on time without any gaps in administration. Over the past month, they have noticed significant improvement in their symptoms, with no swelling in their hands and resolution of pain in their knuckles. Previously, they had an injection for their elbow, but since starting Enbrel, their elbow has not been problematic. They also had their shoulders injected a couple of weeks ago, which has been helpful in managing their symptoms.  They are currently taking 5 mg of prednisone and indomethacin once a day. Since switching to a citrate-free formulation of Enbrel, they have not experienced any more rashes or injection site irritation.  They had a cold in early January, which resolved without the need for antibiotics. The last time they experienced cold or upper respiratory symptoms was in June of the previous year. No recent upper respiratory infections aside from the cold in January.    Previous HPI 02/15/2023 Vanessa Harmon is a 73 y.o. female here for follow up for seronegative RA on prednisone 5 mg daily and indomethacin 75 mg daily and started Enbrel in August.  So far she is not certain about the amount of difference with addition of the Enbrel.  Is not experiencing any injection related reactions.  She has had some additional viral infections that she is not  sure if they are related to medicine or incidental.  She had bilateral shoulder steroid injections with Dr. Durwin Nora pain is getting worse again in these areas.  Also feels increase in left elbow pain that is similar to the episode of epicondylitis which we saw in March. She had called in about perioperative management of Enbrel for surgery for toe joint instability.  This was delayed because her husband had a dog hand bite with skin tearing around the same time. Also had mildly low hemoglobin on routine labs with her primary care PA.  Subsequent fecal occult stool card was positive for blood and so was referred to gastroenterology.  Has not had colonoscopy in years when she did there were no concerning findings.  She has a history of previous internal hemorrhoids.  She is still on the indomethacin.   Previous HPI 11/15/2022 Vanessa Harmon is a 73 y.o. female here for follow up for seronegative RA on prednisone 5 mg daily and indomethacin 75 mg daily.  She stopped taking Rinvoq since about 1 month ago as she felt this was worsening her frequency of upper respiratory infection and needing inhaled bronchodilator and breathing treatments.  Was also having increased symptoms during this time due to seasonal allergies.  Since she stopped the Rinvoq feels this is improved although today is a bad day with persistent coughing.  Noticed small increase in joint pain  of several areas including her shoulders and hips and knee.  She had repeat corticosteroid injection in both shoulders which has been improving her symptoms range of motion is better still pain with use.  Persistent bruising and skin rashes with a new avulsion on the left forearm but about the same as usual.   Labs reviewed 10/2022 WBC 11.9 Hgb 11.8 eGFR 47.27   DMARD Hx Methotrexate - LFT elevations Remicade- Rashes Actemra - Rashes Orencia IV - Secondary nonresponder Rinvoq - Respiratory symptoms worse     Previous HPI 09/21/2022 [name] is a 73 y.o. female here for follow up here for follow-up of seronegative RA on Rinvoq 15 mg p.o. daily and prednisone 5 mg daily.  The left elbow pain discussed at her last visit improved after steroid injection and has continued to do well.  Joint pain and inflammation doing pretty well overall chronic back pain is her biggest issue.  Continues having easy bruising and skin tears frequently from her 90 pound Rottweiler.  Recently sick with sinus infection for the past week is currently on amoxicillin for this.  Not associated with any fevers lymphadenopathy or other constitutional symptoms.   Previous HPI 07/13/22 Vanessa Harmon is a 73 y.o. female here for follow up for seronegative RA on rinvoq 15 mg PO daily and prednisone 5 mg daily.  Since her last visit she finished getting over the persistent sinus infection symptoms after recent COVID illness.  However once the symptoms cleared up she started noticing increase in hip bursitis pain.  Also started getting worsening pain along the medial side of the left elbow.  Most noticeable when using her hands to help her self push off from a seated position due to her hip pain.  Not noticed any visible swelling or erythema she still has bruising around the elbow but that is more chronic and there are multiple skin tears in the area as well.  Does not feel like she is having flareup of increased joint symptoms outside of the hip and elbow pains.  She previously tried use of the elbow brace which we discussed before for her medial epicondylitis but just gets irritation around the skin and elbow does not feel much benefit.   Previous HPI 06/16/22 Vanessa Harmon is a 73 y.o. female here for follow up for seronegative RA on prednisone 5 mg daily and rinvoq 15 mg daily started since 2/18. Also prolia for osteoporosis last injection on 1/24 without complication. Main issue has been sickness with COVID positive test 2/8 and persistent symptoms. She had  subsequent sinus infection treated with augmentin starting 2/21 and had repeat labs checked yesterday. Brain fog, fatigue worse during COVID infection and partially improved since. She thinks rinvoq is starting to help with swelling and pain in hands and wrists but still significant overall symptoms. Has some skin tears and bruising on arms from her dog.   Previous HPI 03/16/22 ISABELL BONAFEDE is a 73 y.o. female here for follow up for seropositive RA on prednisone 5 mg daily. Methotrexate was discontinued due to worsening laboratory function tests and Actemra stopped due to developing palpable rashes throughout her legs similar to previous medication reaction to infliximab. She is also noticed easier bruising than usual and has seen discoloration of her nails with some separation and cracking.  Prednisone is helping somewhat but still has ongoing joint pain and stiffness specially off of her previous medications.  In particular bothering her lately has been persistent pain  along the medial side of her left elbow does not recall any specific injury to this area but does feel like there is a sense of swelling and pressure.   Previous HPI 12/14/21 GIFT RUECKERT is a 73 y.o. female here for follow up for seropositive RA on methotrexate 15 mg subcu weekly prednisone 5 mg daily leucovorin 1 mg daily and after starting Actemra 162 mg [place] q. 14 days.  She feels like arthritis symptoms have been well controlled on her current medications.  No significant flareups.  She still gets swelling in her wrist but no other visible joint swelling.  She has had pretty extensive bruising and easy skin tearing on both arms often from playing with her dog.  Lab tests checked in July did show appears to be a small decrease in estimated GFR.   Previous HPI 09/10/2021 ATHA MURADYAN is a 73 y.o. female here for evaluation of chronic back pain associated with seropositive RA she has been seeing Azucena Fallen on treatment with  orencia IV, MTX 15 mg [place] weekly, leucovorin 1 mg daily, and prednisone 5 mg daily.  She was originally diagnosed with inflammatory arthritis by Dr. Dareen Piano about a decade ago.  Apparently the worst initial symptom at that time was left hip pain and inflammation with concerning findings on MRI.  Was started on methotrexate as a steroid sparing DMARD treatment.  Apparently had initial concern for seronegative rheumatoid arthritis versus PMR presentation.  She did not tolerate methotrexate well initially worst side effect was substantial hair loss.  Also did not tolerate well on leflunomide.  She was started on infliximab treatment developed a significant adverse reaction with new skin rashes in multiple areas and severe finger and toe nail changes that lasted about a year after discontinuing the medication.  She was started on IV Orencia monthly infusions which has been beneficial for symptoms but still notices some amount ongoing and that symptoms tend to worsen between each dosing interval.  She was restarted on methotrexate this time with leucovorin in the past few months and this has improved her joint inflammation further.  She remains on low-dose prednisone 5 mg daily throughout most of this time.  Most recent definite flareup of her rheumatoid arthritis was about 6 months ago. In addition to inflammatory joint disease she has significant amount of generalized osteoarthritis.  She reports bone-on-bone degenerative changes with slightly impaired range of motion in the left shoulder.  She had previous left knee total arthroplasty.  She has degenerative arthritis at multiple levels in the spine with history of repeat lumbar spine surgery.  She has some spinal and foraminal stenosis with radicular symptoms.  She is also had recurrent problems with hip bursitis has been responsive to local steroid injection treatments with Dr. Charlann Boxer.  She takes gabapentin 300 mg at night and as needed Robaxin 500 mg for the ongoing  symptoms. She has not had any history with recurrent infections or major side effects on her current regimen.  She does have extensive bruising mostly worst in the past year and worst on extensor surfaces of arms and legs.  She denies any history of abnormal bleeding and blood clots. She has chronic dry eyes and sees Dr. Dione Booze currently uses xiidra and genteal drops for this.   Review of Systems  Constitutional:  Positive for fatigue.  HENT:  Negative for mouth sores and mouth dryness.   Eyes:  Positive for dryness.  Respiratory:  Negative for shortness of breath.  Cardiovascular:  Negative for chest pain and palpitations.  Gastrointestinal:  Negative for blood in stool, constipation and diarrhea.  Endocrine: Negative for increased urination.  Genitourinary:  Negative for involuntary urination.  Musculoskeletal:  Positive for joint pain, joint pain and morning stiffness. Negative for gait problem, joint swelling, myalgias, muscle weakness, muscle tenderness and myalgias.  Skin:  Positive for sensitivity to sunlight. Negative for color change, rash and hair loss.  Allergic/Immunologic: Positive for susceptible to infections.  Neurological:  Negative for dizziness and headaches.  Hematological:  Negative for swollen glands.  Psychiatric/Behavioral:  Positive for depressed mood. Negative for sleep disturbance. The patient is not nervous/anxious.     PMFS History:  Patient Active Problem List   Diagnosis Date Noted   Age-related osteoporosis without current pathological fracture 06/16/2022   Medial epicondylitis of left elbow 03/16/2022   High risk medication use 09/10/2021   Posterior vitreous detachment of left eye 09/29/2020   Thoracic spondylosis with cord compression 05/29/2020   Heterotopic ossification 03/30/2020   Loosening of hardware in spine (HCC) 01/27/2020   Posterior vitreous detachment of right eye 01/21/2020   Pseudophakia 01/21/2020   Horseshoe retinal tear of right eye  01/21/2020   Body mass index (BMI) 28.0-28.9, adult 11/27/2019   Left knee OA 11/26/2019   Numbness of foot 02/07/2019   Seronegative rheumatoid arthritis (HCC) 01/25/2019   Peripheral polyneuropathy 01/14/2019   Spinal stenosis, thoracolumbar region 01/14/2019   Thoracogenic scoliosis, thoracolumbar region 01/14/2019   Idiopathic scoliosis of thoracolumbar region 02/15/2018   Rhinitis, chronic 05/16/2017   Moderate persistent asthma without complication 05/04/2017   History of polymyalgia rheumatica 02/15/2015   DJD (degenerative joint disease) 04/15/2014   Lumbar spine scoliosis 11/28/2013   Degenerative spondylolisthesis 10/09/2013   Lumbar radiculopathy 09/16/2013   Lumbosacral radiculitis 07/31/2013   Scoliosis, or kyphoscoliosis, idiopathic 07/31/2013   Lumbar back pain 12/19/2011   Hyperlipidemia 10/16/2010   Depression 10/16/2010   Lumbar spondylosis 10/16/2010   Gastroesophageal reflux disease 10/16/2010   Adenomatous polyp of colon 10/16/2010   History of paroxysmal supraventricular tachycardia 10/16/2010   Essential (primary) hypertension 10/16/2010    Past Medical History:  Diagnosis Date   Adenomatous polyp    Allergy    Anxiety    takes Ativan daily as needed   Arthritis    inflammatory arthritis (Dr. Verdie Shire)   Asthma    Cataract    Chronic back pain    scoliosis    Constipation    takes miralax every other day   COVID 05/19/2022   Depression    takes Lexapro daily   Eczema    Essential hypertension, benign    takes Hyzaar and Metoprolol daily   Family history of adverse reaction to anesthesia    Sister PONV   Fibromyalgia    GERD (gastroesophageal reflux disease)    History of colon polyps    History of shingles    Hyperlipidemia    takes Atorvastatin daily   Insomnia    takes Melatonin nightly   Joint pain    Osteoarthritis    Osteoporosis    PSVT (paroxysmal supraventricular tachycardia) (HCC)    Recurrent upper respiratory  infection (URI)    Rheumatoid arthritis (HCC)    sees Dr. Dierdre Forth; stable on Orencia   Rhinitis, allergic    uses Flonase daily   Urinary urgency    Urticaria     Family History  Problem Relation Age of Onset   Atrial fibrillation Mother    Stroke  Mother    Allergic rhinitis Mother    Allergic rhinitis Father    AAA (abdominal aortic aneurysm) Father    COPD Father    Prostate cancer Father    Atrial fibrillation Brother        ablation   Stomach cancer Paternal Grandmother        some sort of abdominal cancer   Hypertension Other        unspecified grandmother   AAA (abdominal aortic aneurysm) Other        unspecified grandfather   Cancer Other        unspecified grandfather   Neuropathy Neg Hx    Colon cancer Neg Hx    Esophageal cancer Neg Hx    Rectal cancer Neg Hx    Past Surgical History:  Procedure Laterality Date   ADENOIDECTOMY     APPENDECTOMY     BACK SURGERY  2012   at age 53 d/t scoliosis   BLEPHAROPLASTY  2018   BREAST BIOPSY     cataract surgery     COLONOSCOPY     HARDWARE REMOVAL Left 02/24/2020   Procedure: HARDWARE REMOVAL LEFT DISTAL RADIUS;  Surgeon: Betha Loa, MD;  Location: [facility];  Service: Orthopedics;  Laterality: Left;   KNEE ARTHROSCOPY Bilateral    neuroma removed from foot     unsure of which foot   OPEN REDUCTION INTERNAL FIXATION (ORIF) DISTAL RADIAL FRACTURE Left 08/15/2019   Procedure: OPEN REDUCTION INTERNAL FIXATION (ORIF) DISTAL RADIAL FRACTURE;  Surgeon: Betha Loa, MD;  Location: [facility];  Service: Orthopedics;  Laterality: Left;  block in preop   POSTERIOR CERVICAL FUSION/FORAMINOTOMY N/A 05/29/2020   Procedure: Removal of hardware, Replacement of thoracic 8, thoracic 9, thoracic 10 screws, Right Thoracic 6-7, Right Thoracic 7-8 Laminectomies with thoracic hooks from Thoracic 4-5 to Thoracic 10;  Surgeon: Maeola Harman, MD;  Location: [facility] OR;  Service: Neurosurgery;  Laterality: N/A;    POSTERIOR LUMBAR FUSION 4 LEVEL N/A 02/15/2018   Procedure: Decompression and fusion Thoracic nine to Lumbar two with exploration of previous fusion;  Surgeon: Maeola Harman, MD;  Location: [facility] OR;  Service: Neurosurgery;  Laterality: N/A;   SHOULDER SURGERY Right    TONSILLECTOMY     TOTAL HIP ARTHROPLASTY Right 04/15/2014   Procedure: RIGHT TOTAL HIP ARTHROPLASTY ANTERIOR APPROACH;  Surgeon: Sheral Apley, MD;  Location: [facility] OR;  Service: Orthopedics;  Laterality: Right;   TOTAL KNEE ARTHROPLASTY Left 11/26/2019   Procedure: TOTAL KNEE ARTHROPLASTY;  Surgeon: Durene Romans, MD;  Location: [facility];  Service: Orthopedics;  Laterality: Left;  70 mins   Social History   Social History Narrative   Retired Engineer, civil (consulting)   No children   Lives with husband   Immunization History  Administered Date(s) Administered   Fluad Trivalent(High Dose 65+) 02/27/2023   Influenza Split 01/31/2012   Influenza Whole 02/16/2010   Influenza, High Dose Seasonal PF 12/13/2018, 02/18/2022   Influenza,inj,Quad PF,6+ Mos 01/14/2014, 01/27/2015, 02/02/2016   Influenza,inj,quad, With Preservative 02/26/2020   Influenza-Unspecified 02/24/2021   Moderna Covid-19 Fall Seasonal Vaccine 65yrs & older 03/04/2023   Moderna Sars-Covid-2 Vaccination 02/20/2022   PFIZER(Purple Top)SARS-COV-2 Vaccination 05/25/2019, 06/15/2019, 02/05/2020, 08/03/2020   Pneumococcal Conjugate-13 01/27/2015   Pneumococcal Polysaccharide-23 05/26/2017   Tdap 10/04/2011   Zoster Recombinant(Shingrix) 10/13/2017, 01/02/2018     Objective: Vital Signs: BP 132/77 (BP Location: Left Arm, Patient Position: Sitting, Cuff Size: Large)   Pulse 66   Resp 14   Ht  5\' 6"  (1.676 m)   Wt 173 lb (78.5 kg)   BMI 27.92 kg/m    Physical Exam Eyes:     Conjunctiva/sclera: Conjunctivae normal.  Cardiovascular:     Rate and Rhythm: Normal rate and regular rhythm.  Pulmonary:     Effort: Pulmonary effort is normal.     Breath sounds: Normal breath sounds.   Musculoskeletal:     Right lower leg: No edema.     Left lower leg: No edema.  Lymphadenopathy:     Cervical: No cervical adenopathy.  Skin:    General: Skin is warm and dry.     Findings: Bruising present. No rash.  Neurological:     Mental Status: She is alert.  Psychiatric:        Mood and Affect: Mood normal.      Musculoskeletal Exam:  Shoulders full ROM bilateral tenderness, no swelling Elbows full ROM, elbows some pain with full flexion and extension Wrists full ROM no tenderness or swelling Firm very superficial mobile nontender cyst on dorsum of left hand proximal to fourth digit Fingers full ROM, chronic finger nodules, lateral deviation of DIPs, thenar muscle atrophy Hip pain with FADIR on left side Knees full ROM no tenderness or swelling, patellofemoral crepitus Ankles full ROM no tenderness or swelling   Investigation: No additional findings.  Imaging: No results found.  Recent Labs: Lab Results  Component Value Date   WBC 8.6 05/11/2023   HGB 13.3 05/11/2023   PLT 287.0 05/11/2023   NA 139 05/11/2023   K 4.4 05/11/2023   CL 101 05/11/2023   CO2 30 05/11/2023   GLUCOSE 133 (H) 05/11/2023   BUN 19 05/11/2023   CREATININE 1.08 05/11/2023   BILITOT 0.5 05/11/2023   ALKPHOS 33 (L) 05/11/2023   AST 16 05/11/2023   ALT 15 05/11/2023   PROT 6.7 05/11/2023   ALBUMIN 4.4 05/11/2023   CALCIUM 9.5 05/11/2023   GFRAA >60 11/27/2019   QFTBGOLDPLUS Negative 11/21/2022    Speciality Comments: No specialty comments available.  Procedures:  No procedures performed Allergies: Advair hfa [fluticasone-salmeterol], Statins, Breo ellipta [fluticasone furoate-vilanterol], Fosamax [alendronate sodium], Levaquin [levofloxacin], Infliximab, Spiriva handihaler [tiotropium bromide monohydrate], Verapamil, and Tape   Assessment / Plan:     Visit Diagnoses: Seronegative rheumatoid arthritis (HCC) - gabapentin 300 mg at night Rx with PCP office. - Plan: predniSONE  (DELTASONE) 5 MG tablet Improvement in symptoms with Enbrel, no gap in therapy. No recent infections. No injection site irritation. Stable on Indomethacin daily but no longer requiring maintenance prednisone due to the Enbrel effectiveness. -Continue Enbrel 50 mg subcu weekly -Continue indomethacin 75 mg p.o. daily  High risk medication use - Enbrel 50 mg subcu weekly, indomethacin 75 mg daily Recent labs reviewed including CBC and CMP from earlier in January reviewed are okay.  No serious interval infections.  Not experiencing any ongoing injection site reactions with Enbrel.  Long term (current) use of systemic steroids - prednisone 5 mg daily  Age-related osteoporosis without current pathological fracture - On Prolia had last dose July 24.  On maintenance vitamin D supplementation.  Plan for repeat bone density monitoring next July 2025.  Medial epicondylitis of left elbow - S/P Medium Joint Inj: L elbow on 02/15/2023 Good improvement from steroid injection, still has mild elbow tenderness but much improved.  Epidermal Cyst on left hand Noticed by patient 3-4 months ago, slowly increasing in size, no pain. Ultrasound confirmed cyst not associated with vein or tendon. -Options discussed:aspiration and  injection vs surgical removal. Patient to consider options and potentially follow up with orthopedist for surgical removal if desired.   Orders: No orders of the defined types were placed in this encounter.  Meds ordered this encounter  Medications   predniSONE (DELTASONE) 5 MG tablet    Sig: Take 1 tablet (5 mg total) by mouth daily with breakfast.    Dispense:  90 tablet    Refill:  0     Follow-Up Instructions: Return in about 3 months (around 08/14/2023) for RA on ENB/GC/NSAID f/u 3mos.   Fuller Plan, MD  Note - This record has been created using [employer].  Chart creation errors have been sought, but may not always  have been located. Such creation errors do not  reflect on  the standard of medical care.

## 2023-05-06 ENCOUNTER — Other Ambulatory Visit: Payer: Self-pay | Admitting: Physician Assistant

## 2023-05-09 ENCOUNTER — Encounter: Payer: Self-pay | Admitting: Gastroenterology

## 2023-05-09 DIAGNOSIS — M961 Postlaminectomy syndrome, not elsewhere classified: Secondary | ICD-10-CM | POA: Diagnosis not present

## 2023-05-09 DIAGNOSIS — M5412 Radiculopathy, cervical region: Secondary | ICD-10-CM | POA: Diagnosis not present

## 2023-05-10 ENCOUNTER — Other Ambulatory Visit: Payer: Self-pay

## 2023-05-11 ENCOUNTER — Encounter: Payer: Self-pay | Admitting: Physician Assistant

## 2023-05-11 ENCOUNTER — Ambulatory Visit: Payer: Medicare Other | Admitting: Physician Assistant

## 2023-05-11 VITALS — BP 150/90 | HR 76 | Temp 97.2°F | Ht 66.0 in | Wt 173.2 lb

## 2023-05-11 DIAGNOSIS — E782 Mixed hyperlipidemia: Secondary | ICD-10-CM

## 2023-05-11 DIAGNOSIS — I10 Essential (primary) hypertension: Secondary | ICD-10-CM

## 2023-05-11 DIAGNOSIS — E663 Overweight: Secondary | ICD-10-CM

## 2023-05-11 DIAGNOSIS — Z0000 Encounter for general adult medical examination without abnormal findings: Secondary | ICD-10-CM | POA: Diagnosis not present

## 2023-05-11 DIAGNOSIS — M5416 Radiculopathy, lumbar region: Secondary | ICD-10-CM | POA: Diagnosis not present

## 2023-05-11 DIAGNOSIS — E118 Type 2 diabetes mellitus with unspecified complications: Secondary | ICD-10-CM

## 2023-05-11 LAB — COMPREHENSIVE METABOLIC PANEL
ALT: 15 U/L (ref 0–35)
AST: 16 U/L (ref 0–37)
Albumin: 4.4 g/dL (ref 3.5–5.2)
Alkaline Phosphatase: 33 U/L — ABNORMAL LOW (ref 39–117)
BUN: 19 mg/dL (ref 6–23)
CO2: 30 meq/L (ref 19–32)
Calcium: 9.5 mg/dL (ref 8.4–10.5)
Chloride: 101 meq/L (ref 96–112)
Creatinine, Ser: 1.08 mg/dL (ref 0.40–1.20)
GFR: 50.78 mL/min — ABNORMAL LOW (ref >60.00)
Glucose, Bld: 133 mg/dL — ABNORMAL HIGH (ref 70–99)
Potassium: 4.4 meq/L (ref 3.5–5.1)
Sodium: 139 meq/L (ref 135–145)
Total Bilirubin: 0.5 mg/dL (ref 0.2–1.2)
Total Protein: 6.7 g/dL (ref 6.0–8.3)

## 2023-05-11 LAB — LIPID PANEL
Cholesterol: 123 mg/dL (ref 0–200)
HDL: 48.1 mg/dL (ref >39.00)
LDL Cholesterol: 28 mg/dL (ref 0–99)
NonHDL: 75.21
Total CHOL/HDL Ratio: 3
Triglycerides: 238 mg/dL — ABNORMAL HIGH (ref 0.0–149.0)
VLDL: 47.6 mg/dL — ABNORMAL HIGH (ref 0.0–40.0)

## 2023-05-11 LAB — CBC WITH DIFFERENTIAL/PLATELET
Basophils Absolute: 0.1 10*3/uL (ref 0.0–0.1)
Basophils Relative: 0.6 % (ref 0.0–3.0)
Eosinophils Absolute: 0.2 10*3/uL (ref 0.0–0.7)
Eosinophils Relative: 2 % (ref 0.0–5.0)
HCT: 41.2 % (ref 36.0–46.0)
Hemoglobin: 13.3 g/dL (ref 12.0–15.0)
Lymphocytes Relative: 12.1 % (ref 12.0–46.0)
Lymphs Abs: 1 10*3/uL (ref 0.7–4.0)
MCHC: 32.2 g/dL (ref 30.0–36.0)
MCV: 100.4 fL — ABNORMAL HIGH (ref 78.0–100.0)
Monocytes Absolute: 0.7 10*3/uL (ref 0.1–1.0)
Monocytes Relative: 7.9 % (ref 3.0–12.0)
Neutro Abs: 6.6 10*3/uL (ref 1.4–7.7)
Neutrophils Relative %: 77.4 % — ABNORMAL HIGH (ref 43.0–77.0)
Platelets: 287 10*3/uL (ref 150.0–400.0)
RBC: 4.1 Mil/uL (ref 3.87–5.11)
RDW: 13.4 % (ref 11.5–15.5)
WBC: 8.6 10*3/uL (ref 4.0–10.5)

## 2023-05-11 LAB — HEMOGLOBIN A1C: Hgb A1c MFr Bld: 6 % (ref 4.6–6.5)

## 2023-05-11 NOTE — Addendum Note (Signed)
Addended by: Lorn Junes on: 05/11/2023 12:55 PM   Modules accepted: Orders

## 2023-05-11 NOTE — Progress Notes (Signed)
Subjective:    Vanessa Harmon is a 73 y.o. female and is here for a comprehensive physical exam.  HPI  Health Maintenance Due  Topic Date Due   Diabetic kidney evaluation - Urine ACR  Never done   Medicare Annual Wellness (AWV)  07/09/2022    Acute Concerns: None  Chronic Issues: Hypertension: Reports compliance and tolerance of 10 mg Amlodipine daily, 300 mg Irbesartan daily, and 20 mg Lasix.  Denies regularly checking her BP at home but states that it has been within normal ranges in during other follow ups.  Patient denies chest pain, SOB, blurred vision, dizziness, unusual headaches, lower leg swelling. Patient is compliant with medication. Denies excessive caffeine intake, stimulant usage, excessive alcohol intake, or increase in salt.  Anxiety and Depression Well-controlled with Lexapro 20 mg daily. No complains or symptoms at this time.   Familial hyperlipidemia  Following up with cardiology  Currently on Ezetimibe 10 mg and Repatha 140 mg.  No complains or symptoms at this time.   Diabetes 6 month follow-up. Current DM meds: none. Blood sugars at home are: not checked. Patient is  compliant with medications. Denies: hypoglycemic or hyperglycemic episodes or symptoms. This patient's diabetes is complicated by hypertension and hld.  Lab Results  Component Value Date   HGBA1C 6.0 10/26/2022     Health Maintenance: Immunizations -- N/A Colonoscopy -- last done on 04-03-2023. Polyps, diverticulosis. Repeat in 5 years. Mammogram -- last done on 06-16-22. PAP -- last done on 10-30-12. Normal.  Bone Density -- N/A Diet -- typical diet Exercise -- minimal exercise levels. Interested in PT refill.   Sleep habits -- no complains Mood -- stable  UTD with dentist? - yes UTD with eye doctor? - yes  Weight history: Wt Readings from Last 10 Encounters:  05/11/23 173 lb 4 oz (78.6 kg)  04/03/23 175 lb (79.4 kg)  03/10/23 175 lb (79.4 kg)  02/22/23 174 lb (78.9  kg)  02/15/23 172 lb (78 kg)  12/13/22 175 lb 6 oz (79.5 kg)  11/15/22 178 lb (80.7 kg)  10/26/22 184 lb 8 oz (83.7 kg)  09/29/22 188 lb 11.2 oz (85.6 kg)  09/21/22 187 lb (84.8 kg)   Body mass index is 27.96 kg/m. No LMP recorded. Patient is postmenopausal.  Alcohol use:  reports that she does not currently use alcohol.  Tobacco use:  Tobacco Use: Medium Risk (05/11/2023)   Patient History    Smoking Tobacco Use: Former    Smokeless Tobacco Use: Never    Passive Exposure: Never   Eligible for lung cancer screening? no     05/11/2023   11:09 AM  Depression screen PHQ 2/9  Decreased Interest 0  Down, Depressed, Hopeless 0  PHQ - 2 Score 0     Other providers/specialists: Patient Care Team: Jarold Motto, [place] as PCP - General (Physician Assistant) Donnetta Hail, MD as Consulting Physician (Rheumatology) Maeola Harman, MD as Consulting Physician (Neurosurgery) Manfred Shirts, MD as Referring Physician (Allergy and Immunology) Ernesto Rutherford, MD as Consulting Physician (Ophthalmology) Luciana Axe Alford Highland, MD as Consulting Physician (Retina Ophthalmology) Durene Romans, MD as Consulting Physician (Orthopedic Surgery) Maxie Barb, MD as Consulting Physician (Nephrology) Erroll Luna, [facility] (Inactive) as Pharmacist (Pharmacist)    PMHx, SurgHx, SocialHx, Medications, and Allergies were reviewed in the Visit Navigator and updated as appropriate.   Past Medical History:  Diagnosis Date   Adenomatous polyp    Allergy    Anxiety    takes Ativan  daily as needed   Arthritis    inflammatory arthritis (Dr. Verdie Shire)   Asthma    Cataract    Chronic back pain    scoliosis    Constipation    takes miralax every other day   COVID 05/19/2022   Depression    takes Lexapro daily   Eczema    Essential hypertension, benign    takes Hyzaar and Metoprolol daily   Family history of adverse reaction to anesthesia    Sister PONV   Fibromyalgia    GERD  (gastroesophageal reflux disease)    History of colon polyps    History of shingles    Hyperlipidemia    takes Atorvastatin daily   Insomnia    takes Melatonin nightly   Joint pain    Osteoarthritis    Osteoporosis    PSVT (paroxysmal supraventricular tachycardia) (HCC)    Recurrent upper respiratory infection (URI)    Rheumatoid arthritis (HCC)    sees Dr. Dierdre Forth; stable on Orencia   Rhinitis, allergic    uses Flonase daily   Urinary urgency    Urticaria      Past Surgical History:  Procedure Laterality Date   ADENOIDECTOMY     APPENDECTOMY     BACK SURGERY  2012   at age 89 d/t scoliosis   BLEPHAROPLASTY  2018   BREAST BIOPSY     cataract surgery     COLONOSCOPY     HARDWARE REMOVAL Left 02/24/2020   Procedure: HARDWARE REMOVAL LEFT DISTAL RADIUS;  Surgeon: Betha Loa, MD;  Location: [facility];  Service: Orthopedics;  Laterality: Left;   KNEE ARTHROSCOPY Bilateral    neuroma removed from foot     unsure of which foot   OPEN REDUCTION INTERNAL FIXATION (ORIF) DISTAL RADIAL FRACTURE Left 08/15/2019   Procedure: OPEN REDUCTION INTERNAL FIXATION (ORIF) DISTAL RADIAL FRACTURE;  Surgeon: Betha Loa, MD;  Location: [facility];  Service: Orthopedics;  Laterality: Left;  block in preop   POSTERIOR CERVICAL FUSION/FORAMINOTOMY N/A 05/29/2020   Procedure: Removal of hardware, Replacement of thoracic 8, thoracic 9, thoracic 10 screws, Right Thoracic 6-7, Right Thoracic 7-8 Laminectomies with thoracic hooks from Thoracic 4-5 to Thoracic 10;  Surgeon: Maeola Harman, MD;  Location: [facility] OR;  Service: Neurosurgery;  Laterality: N/A;   POSTERIOR LUMBAR FUSION 4 LEVEL N/A 02/15/2018   Procedure: Decompression and fusion Thoracic nine to Lumbar two with exploration of previous fusion;  Surgeon: Maeola Harman, MD;  Location: [facility] OR;  Service: Neurosurgery;  Laterality: N/A;   SHOULDER SURGERY Right    TONSILLECTOMY     TOTAL HIP ARTHROPLASTY Right 04/15/2014    Procedure: RIGHT TOTAL HIP ARTHROPLASTY ANTERIOR APPROACH;  Surgeon: Sheral Apley, MD;  Location: [facility] OR;  Service: Orthopedics;  Laterality: Right;   TOTAL KNEE ARTHROPLASTY Left 11/26/2019   Procedure: TOTAL KNEE ARTHROPLASTY;  Surgeon: Durene Romans, MD;  Location: [facility] ORS;  Service: Orthopedics;  Laterality: Left;  70 mins     Family History  Problem Relation Age of Onset   Atrial fibrillation Mother    Stroke Mother    Allergic rhinitis Mother    Allergic rhinitis Father    AAA (abdominal aortic aneurysm) Father    COPD Father    Prostate cancer Father    Atrial fibrillation Brother        ablation   Stomach cancer Paternal Grandmother        some sort of abdominal cancer   Hypertension  Other        unspecified grandmother   AAA (abdominal aortic aneurysm) Other        unspecified grandfather   Cancer Other        unspecified grandfather   Neuropathy Neg Hx    Colon cancer Neg Hx    Esophageal cancer Neg Hx    Rectal cancer Neg Hx     Social History   Tobacco Use   Smoking status: Former    Current packs/day: 0.00    Average packs/day: 1 pack/day for 20.0 years (20.0 ttl pk-yrs)    Types: Cigarettes    Start date: 76    Quit date: 1998    Years since quitting: 27.0    Passive exposure: Never   Smokeless tobacco: Never  Vaping Use   Vaping status: Never Used  Substance Use Topics   Alcohol use: Not Currently    Comment: rarely with dinner   Drug use: No    Review of Systems:   Review of Systems  Constitutional:  Negative for chills, fever, malaise/fatigue and weight loss.  HENT:  Negative for hearing loss, sinus pain and sore throat.   Respiratory:  Negative for cough and hemoptysis.   Cardiovascular:  Negative for chest pain, palpitations, leg swelling and PND.  Gastrointestinal:  Negative for abdominal pain, constipation, diarrhea, heartburn, nausea and vomiting.  Genitourinary:  Negative for dysuria, frequency and urgency.  Musculoskeletal:   Negative for back pain, myalgias and neck pain.  Skin:  Negative for itching and rash.  Neurological:  Negative for dizziness, tingling, seizures and headaches.  Endo/Heme/Allergies:  Negative for polydipsia.  Psychiatric/Behavioral:  Negative for depression. The patient is not nervous/anxious.     Objective:   BP (!) 150/90 (BP Location: Left Arm, Patient Position: Sitting, Cuff Size: Normal)   Pulse 76   Temp (!) 97.2 F (36.2 C) (Temporal)   Ht 5\' 6"  (1.676 m)   Wt 173 lb 4 oz (78.6 kg)   SpO2 99%   BMI 27.96 kg/m  Body mass index is 27.96 kg/m.   General Appearance:    Alert, cooperative, no distress, appears stated age  Head:    Normocephalic, without obvious abnormality, atraumatic  Eyes:    PERRL, conjunctiva/corneas clear, EOM's intact, fundi    benign, both eyes  Ears:    Normal TM's and external ear canals, both ears  Nose:   Nares normal, septum midline, mucosa normal, no drainage    or sinus tenderness  Throat:   Lips, mucosa, and tongue normal; teeth and gums normal  Neck:   Supple, symmetrical, trachea midline, no adenopathy;    thyroid:  no enlargement/tenderness/nodules; no carotid   bruit or JVD  Back:     Symmetric, no curvature, ROM normal, no CVA tenderness  Lungs:     Clear to auscultation bilaterally, respirations unlabored  Chest Wall:    No tenderness or deformity   Heart:    Regular rate and rhythm, S1 and S2 normal, no murmur, rub or gallop  Breast Exam:    Deferred  Abdomen:     Soft, non-tender, bowel sounds active all four quadrants,    no masses, no organomegaly  Genitalia:    Deferred   Extremities:   Extremities normal, atraumatic, no cyanosis or edema  Pulses:   2+ and symmetric all extremities  Skin:   Skin color, texture, turgor normal, no rashes or lesions  Lymph nodes:   Cervical, supraclavicular, and axillary nodes normal  Neurologic:  CNII-XII intact, normal strength, sensation and reflexes    throughout    Assessment/Plan:    Routine physical examination Today patient counseled on age appropriate routine health concerns for screening and prevention, each reviewed and up to date or declined. Immunizations reviewed and up to date or declined. Labs ordered and reviewed. Risk factors for depression reviewed and negative. Hearing function and visual acuity are intact. ADLs screened and addressed as needed. Functional ability and level of safety reviewed and appropriate. Education, counseling and referrals performed based on assessed risks today. Patient provided with a copy of personalized plan for preventive services.  Controlled type 2 diabetes mellitus with complication, without long-term current use of insulin (HCC) Update A1c and provide recommendations accordingly  Essential (primary) hypertension Above goal today No evidence of end-organ damage on my exam Recommend patient monitor home blood pressure at least a few times weekly Continue 10 mg Amlodipine daily, 300 mg Irbesartan daily, and 20 mg Lasix daily If home monitoring shows consistent elevation, or any symptom(s) develop, recommend reach out to [place] for further advice on next steps  Mixed hyperlipidemia Update lipid panel  Management per lipid team  Lumbar radiculopathy Reviewed care she is receiving with orthopedics - denies major concerns Referral to physical therapy per her request  Continue to monitor  Overweight Continue efforts at healthy diet and exercise   Jarold Motto, PA-C [facility],acting as a Neurosurgeon for [employer], PA.,have documented all relevant documentation on the behalf of Jarold Motto, PA,as directed by  Jarold Motto, PA while in the presence of Jarold Motto, [place].   I, Jarold Motto, [place], have reviewed all documentation for this visit. The documentation on 05/11/23 for the exam, diagnosis, procedures, and orders are all accurate and complete.

## 2023-05-12 ENCOUNTER — Encounter: Payer: Self-pay | Admitting: Physician Assistant

## 2023-05-15 ENCOUNTER — Other Ambulatory Visit (HOSPITAL_COMMUNITY): Payer: Self-pay

## 2023-05-15 ENCOUNTER — Telehealth: Payer: Self-pay

## 2023-05-15 ENCOUNTER — Other Ambulatory Visit: Payer: Self-pay

## 2023-05-15 NOTE — Telephone Encounter (Signed)
Patient contacted the office and left a message stating she is due for Enbrel and does not have any. Contacted the patient and advised a three month supply was sent in January. Patient states she never received the Enbrel from [facility] Patient states she believes it is due to the fact the co pay went up to $1,000+. Patient states she contacted the pharmacy and patient was told she could come pick up the Enbrel from the [facility] around 4:30 pm today. Patient states she is not happy and wanted to let Dr. Dimple Casey and the Pharmacy team know. Patient is currently heading to the pharmacy to try to pick up her prescription.

## 2023-05-15 NOTE — Telephone Encounter (Signed)
Will call patient tomorrow to discuss and explain situation. Dr. Dimple Casey is not responsible for this issue - appears that pharmacy had to confirm patient was okay with the cost because of increased copay.  Chesley Mires, PharmD, MPH, BCPS, CPP Clinical Pharmacist (Rheumatology and Pulmonology)

## 2023-05-16 ENCOUNTER — Encounter: Payer: Self-pay | Admitting: Internal Medicine

## 2023-05-16 ENCOUNTER — Ambulatory Visit: Payer: Medicare Other | Attending: Internal Medicine | Admitting: Internal Medicine

## 2023-05-16 VITALS — BP 132/77 | HR 66 | Resp 14 | Ht 66.0 in | Wt 173.0 lb

## 2023-05-16 DIAGNOSIS — M0600 Rheumatoid arthritis without rheumatoid factor, unspecified site: Secondary | ICD-10-CM

## 2023-05-16 DIAGNOSIS — M810 Age-related osteoporosis without current pathological fracture: Secondary | ICD-10-CM

## 2023-05-16 DIAGNOSIS — M7702 Medial epicondylitis, left elbow: Secondary | ICD-10-CM | POA: Diagnosis not present

## 2023-05-16 DIAGNOSIS — Z79899 Other long term (current) drug therapy: Secondary | ICD-10-CM

## 2023-05-16 DIAGNOSIS — Z7952 Long term (current) use of systemic steroids: Secondary | ICD-10-CM

## 2023-05-16 LAB — MICROALBUMIN / CREATININE URINE RATIO
Creatinine,U: 37.9 mg/dL
Microalb Creat Ratio: 1.8 mg/g (ref 0.0–30.0)
Microalb, Ur: <0.7 mg/dL (ref 0.0–1.9)

## 2023-05-16 MED ORDER — PREDNISONE 5 MG PO TABS: 5.0000 mg | ORAL_TABLET | Freq: Every day | ORAL | 0 refills | Status: DC

## 2023-05-16 NOTE — Telephone Encounter (Signed)
Spoke with patient regarding Enbrel shipment issues. Apologized for overlooking message from pharmacy among the many that we had to address. Recommended she call clinic directly if there is any issue with pharmacy and we can help troubleshoot  Patient states she believes it is pharmacy's responsibility to communicate a copay change to her and the clinic does not need to involved unless she requests to do so. Advised that this is the current workflow at pharmacy.  Apologized again and patient did pick up medication from pharmacy yesterday evening.  Vanessa Harmon, PharmD, MPH, BCPS, CPP Clinical Pharmacist (Rheumatology and Pulmonology)

## 2023-05-16 NOTE — Patient Instructions (Signed)
I believe the hand nodule is most likely an epidermoid cyst.

## 2023-05-18 ENCOUNTER — Other Ambulatory Visit: Payer: Self-pay

## 2023-05-18 ENCOUNTER — Other Ambulatory Visit (HOSPITAL_COMMUNITY): Payer: Self-pay

## 2023-05-18 ENCOUNTER — Telehealth: Payer: Self-pay | Admitting: Pharmacist

## 2023-05-18 DIAGNOSIS — M810 Age-related osteoporosis without current pathological fracture: Secondary | ICD-10-CM

## 2023-05-18 MED ORDER — DENOSUMAB 60 MG/ML ~~LOC~~ SOSY
60.0000 mg | PREFILLED_SYRINGE | SUBCUTANEOUS | 0 refills | Status: DC
  Filled 2023-05-18: qty 1, 180d supply, fill #0

## 2023-05-18 NOTE — Progress Notes (Signed)
Specialty Pharmacy Refill Coordination Note  Vanessa Harmon is a 73 y.o. female assessed today regarding refills of clinic administered specialty medication(s) Denosumab (PROLIA)   Clinic requested Courier to Provider Office   Delivery date: 05/22/23   Verified address: [address]   Medication will be filled on 01.31.25.

## 2023-05-18 NOTE — Telephone Encounter (Signed)
Patient due for Prolia on 05/08/2023.  Labs were updated 05/11/2023.  Copay is $0. Called patient to schedule Prolia appt. She is scheduled for 05/23/2023. Rx sent to [facility] to be couriered to clinic by this date  Chesley Mires, PharmD, MPH, BCPS, CPP Clinical Pharmacist (Rheumatology and Pulmonology)

## 2023-05-19 ENCOUNTER — Other Ambulatory Visit: Payer: Self-pay

## 2023-05-23 ENCOUNTER — Ambulatory Visit: Payer: Medicare Other | Attending: Internal Medicine | Admitting: Pharmacist

## 2023-05-23 DIAGNOSIS — M810 Age-related osteoporosis without current pathological fracture: Secondary | ICD-10-CM | POA: Diagnosis not present

## 2023-05-23 DIAGNOSIS — Z7952 Long term (current) use of systemic steroids: Secondary | ICD-10-CM

## 2023-05-23 MED ORDER — DENOSUMAB 60 MG/ML ~~LOC~~ SOSY
60.0000 mg | PREFILLED_SYRINGE | SUBCUTANEOUS | Status: AC
  Administered 2023-05-23: 60 mg via SUBCUTANEOUS

## 2023-05-23 NOTE — Progress Notes (Signed)
 Pharmacy Note  Subjective:   Patient presents to clinic today to receive bi-annual dose of Prolia . Patient's last dose of Prolia  was on 11/09/2022.  Patient running a fever or have signs/symptoms of infection? No  Patient currently on antibiotics for the treatment of infection? No On Azithromycin 250 mg three times a week for inflammation.   Patient had fall in the last 6 months?  No  If yes, did it require medical attention? N/A   Patient taking calcium  1200 mg daily through diet or supplement and at least 800 units vitamin D ? Yes  Objective: CMP     Component Value Date/Time   NA 139 05/11/2023 1143   NA 139 04/05/2021 0000   K 4.4 05/11/2023 1143   CL 101 05/11/2023 1143   CO2 30 05/11/2023 1143   GLUCOSE 133 (H) 05/11/2023 1143   BUN 19 05/11/2023 1143   BUN 13 04/05/2021 0000   CREATININE 1.08 05/11/2023 1143   CREATININE 1.17 (H) 02/15/2023 1539   CALCIUM  9.5 05/11/2023 1143   PROT 6.7 05/11/2023 1143   ALBUMIN 4.4 05/11/2023 1143   AST 16 05/11/2023 1143   ALT 15 05/11/2023 1143   ALKPHOS 33 (L) 05/11/2023 1143   BILITOT 0.5 05/11/2023 1143   GFRNONAA 63 04/05/2021 0000   GFRNONAA >60 05/27/2020 1333   GFRNONAA 57 (L) 04/13/2017 1201   GFRAA >60 11/27/2019 0244   GFRAA 66 04/13/2017 1201    CBC    Component Value Date/Time   WBC 8.6 05/11/2023 1143   RBC 4.10 05/11/2023 1143   HGB 13.3 05/11/2023 1143   HCT 41.2 05/11/2023 1143   PLT 287.0 05/11/2023 1143   MCV 100.4 (H) 05/11/2023 1143   MCH 32.0 02/15/2023 1539   MCHC 32.2 05/11/2023 1143   RDW 13.4 05/11/2023 1143   LYMPHSABS 1.0 05/11/2023 1143   MONOABS 0.7 05/11/2023 1143   EOSABS 0.2 05/11/2023 1143   BASOSABS 0.1 05/11/2023 1143    Lab Results  Component Value Date   VD25OH 64.50 06/15/2022    T-score: (11/10/2021): -1.1  Assessment/Plan:   Reviewed importance of adequate dietary intake of calcium  in addition to supplementation due to risk of hypocalcemia with Prolia .   Patient  tolerated injection well. Injected into left arm   Administration details as below: Administrations This Visit     denosumab  (PROLIA ) injection 60 mg     Admin Date 05/23/2023 Action Given Dose 60 mg Route Subcutaneous Documented By Trulee Hamstra E, RPH            Patient's next Prolia  dose is due on November 20, 2023.  Patient is due for updated DEXA in August 2025.   All questions encouraged and answered.  Instructed patient to call with any further questions or concerns.    Tolu Joah Patlan, PharmD [facility] PGY-1

## 2023-05-29 ENCOUNTER — Other Ambulatory Visit: Payer: Self-pay

## 2023-06-01 ENCOUNTER — Other Ambulatory Visit (HOSPITAL_COMMUNITY): Payer: Self-pay

## 2023-06-01 DIAGNOSIS — H43311 Vitreous membranes and strands, right eye: Secondary | ICD-10-CM | POA: Diagnosis not present

## 2023-06-01 DIAGNOSIS — H43393 Other vitreous opacities, bilateral: Secondary | ICD-10-CM | POA: Diagnosis not present

## 2023-06-01 DIAGNOSIS — H33311 Horseshoe tear of retina without detachment, right eye: Secondary | ICD-10-CM | POA: Diagnosis not present

## 2023-06-01 DIAGNOSIS — H43312 Vitreous membranes and strands, left eye: Secondary | ICD-10-CM | POA: Diagnosis not present

## 2023-06-01 DIAGNOSIS — H43812 Vitreous degeneration, left eye: Secondary | ICD-10-CM | POA: Diagnosis not present

## 2023-06-01 DIAGNOSIS — H401132 Primary open-angle glaucoma, bilateral, moderate stage: Secondary | ICD-10-CM | POA: Diagnosis not present

## 2023-06-01 DIAGNOSIS — H43811 Vitreous degeneration, right eye: Secondary | ICD-10-CM | POA: Diagnosis not present

## 2023-06-05 DIAGNOSIS — J4541 Moderate persistent asthma with (acute) exacerbation: Secondary | ICD-10-CM | POA: Diagnosis not present

## 2023-06-05 DIAGNOSIS — J328 Other chronic sinusitis: Secondary | ICD-10-CM | POA: Diagnosis not present

## 2023-06-05 DIAGNOSIS — J310 Chronic rhinitis: Secondary | ICD-10-CM | POA: Diagnosis not present

## 2023-06-05 DIAGNOSIS — J4530 Mild persistent asthma, uncomplicated: Secondary | ICD-10-CM | POA: Diagnosis not present

## 2023-06-06 ENCOUNTER — Other Ambulatory Visit (HOSPITAL_COMMUNITY): Payer: Self-pay

## 2023-06-06 ENCOUNTER — Other Ambulatory Visit: Payer: Self-pay

## 2023-06-06 ENCOUNTER — Other Ambulatory Visit: Payer: Self-pay | Admitting: Physician Assistant

## 2023-06-06 NOTE — Progress Notes (Signed)
Specialty Pharmacy Refill Coordination Note  Vanessa Harmon is a 73 y.o. female contacted today regarding refills of specialty medication(s) No data recorded  Patient requested (Patient-Rptd) Delivery   Delivery date: (Patient-Rptd) 06/09/23   [place]pt[address] [place] [address]   Medication will be filled on 06/08/23.
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
## 2023-06-20 DIAGNOSIS — M545 Low back pain, unspecified: Secondary | ICD-10-CM | POA: Diagnosis not present

## 2023-06-21 DIAGNOSIS — Z1231 Encounter for screening mammogram for malignant neoplasm of breast: Secondary | ICD-10-CM | POA: Diagnosis not present

## 2023-06-21 LAB — HM MAMMOGRAPHY

## 2023-06-22 ENCOUNTER — Encounter: Payer: Self-pay | Admitting: Physician Assistant

## 2023-06-23 DIAGNOSIS — M545 Low back pain, unspecified: Secondary | ICD-10-CM | POA: Diagnosis not present

## 2023-06-26 ENCOUNTER — Other Ambulatory Visit: Payer: Self-pay | Admitting: Physician Assistant

## 2023-06-27 ENCOUNTER — Other Ambulatory Visit (HOSPITAL_COMMUNITY): Payer: Self-pay

## 2023-06-27 ENCOUNTER — Other Ambulatory Visit: Payer: Self-pay

## 2023-06-27 DIAGNOSIS — M545 Low back pain, unspecified: Secondary | ICD-10-CM | POA: Diagnosis not present

## 2023-06-27 NOTE — Progress Notes (Signed)
 Specialty Pharmacy Refill Coordination Note  Vanessa Harmon is a 73 y.o. female contacted today regarding refills of specialty medication(s) No data recorded  Patient requested (Patient-Rptd) Delivery   Delivery date: (Patient-Rptd) 07/06/23   Verified address: (Patient-Rptd) [address], [place] [address]   Medication will be filled on 06/29/23. New delivery date is 06/30/23. Patient has been notified.

## 2023-06-28 DIAGNOSIS — H43312 Vitreous membranes and strands, left eye: Secondary | ICD-10-CM | POA: Diagnosis not present

## 2023-06-29 ENCOUNTER — Encounter: Payer: Self-pay | Admitting: Physician Assistant

## 2023-07-14 DIAGNOSIS — M545 Low back pain, unspecified: Secondary | ICD-10-CM | POA: Diagnosis not present

## 2023-07-15 ENCOUNTER — Encounter (HOSPITAL_BASED_OUTPATIENT_CLINIC_OR_DEPARTMENT_OTHER): Payer: Self-pay | Admitting: Emergency Medicine

## 2023-07-15 ENCOUNTER — Ambulatory Visit (HOSPITAL_BASED_OUTPATIENT_CLINIC_OR_DEPARTMENT_OTHER)
Admission: EM | Admit: 2023-07-15 | Discharge: 2023-07-16 | Disposition: A | Discharge status: Home / Self Care | Attending: Emergency Medicine | Admitting: Emergency Medicine

## 2023-07-15 ENCOUNTER — Emergency Department (HOSPITAL_BASED_OUTPATIENT_CLINIC_OR_DEPARTMENT_OTHER)

## 2023-07-15 ENCOUNTER — Encounter (HOSPITAL_COMMUNITY)
Admission: EM | Disposition: A | Discharge status: Home / Self Care | Payer: Self-pay | Source: Home / Self Care | Attending: Emergency Medicine

## 2023-07-15 ENCOUNTER — Emergency Department (HOSPITAL_COMMUNITY): Admitting: Certified Registered Nurse Anesthetist

## 2023-07-15 ENCOUNTER — Other Ambulatory Visit: Payer: Self-pay

## 2023-07-15 ENCOUNTER — Emergency Department (HOSPITAL_BASED_OUTPATIENT_CLINIC_OR_DEPARTMENT_OTHER): Admitting: Certified Registered Nurse Anesthetist

## 2023-07-15 DIAGNOSIS — I10 Essential (primary) hypertension: Secondary | ICD-10-CM | POA: Diagnosis not present

## 2023-07-15 DIAGNOSIS — S51852A Open bite of left forearm, initial encounter: Secondary | ICD-10-CM | POA: Insufficient documentation

## 2023-07-15 DIAGNOSIS — S51812A Laceration without foreign body of left forearm, initial encounter: Secondary | ICD-10-CM | POA: Insufficient documentation

## 2023-07-15 DIAGNOSIS — Z87891 Personal history of nicotine dependence: Secondary | ICD-10-CM | POA: Insufficient documentation

## 2023-07-15 DIAGNOSIS — S51832A Puncture wound without foreign body of left forearm, initial encounter: Secondary | ICD-10-CM | POA: Diagnosis not present

## 2023-07-15 DIAGNOSIS — K219 Gastro-esophageal reflux disease without esophagitis: Secondary | ICD-10-CM | POA: Diagnosis not present

## 2023-07-15 DIAGNOSIS — J45909 Unspecified asthma, uncomplicated: Secondary | ICD-10-CM | POA: Diagnosis not present

## 2023-07-15 DIAGNOSIS — M797 Fibromyalgia: Secondary | ICD-10-CM | POA: Diagnosis not present

## 2023-07-15 DIAGNOSIS — S51802A Unspecified open wound of left forearm, initial encounter: Secondary | ICD-10-CM | POA: Diagnosis not present

## 2023-07-15 DIAGNOSIS — W540XXA Bitten by dog, initial encounter: Secondary | ICD-10-CM | POA: Insufficient documentation

## 2023-07-15 DIAGNOSIS — Z79899 Other long term (current) drug therapy: Secondary | ICD-10-CM | POA: Diagnosis not present

## 2023-07-15 DIAGNOSIS — S52502A Unspecified fracture of the lower end of left radius, initial encounter for closed fracture: Secondary | ICD-10-CM | POA: Diagnosis not present

## 2023-07-15 DIAGNOSIS — F418 Other specified anxiety disorders: Secondary | ICD-10-CM | POA: Insufficient documentation

## 2023-07-15 SURGERY — WOUND EXPLORATION
Anesthesia: General | Site: Arm Lower | Laterality: Left

## 2023-07-15 MED ORDER — LACTATED RINGERS IV SOLN: INTRAVENOUS | Status: DC | PRN

## 2023-07-15 MED ORDER — ROCURONIUM BROMIDE 10 MG/ML (PF) SYRINGE
PREFILLED_SYRINGE | INTRAVENOUS | Status: DC | PRN
  Administered 2023-07-15: 40 mg via INTRAVENOUS

## 2023-07-15 MED ORDER — TRANEXAMIC ACID 1000 MG/10ML IV SOLN
500.0000 mg | Freq: Once | INTRAVENOUS | Status: AC
  Administered 2023-07-15: 500 mg via TOPICAL
  Filled 2023-07-15: qty 10

## 2023-07-15 MED ORDER — ACETAMINOPHEN 10 MG/ML IV SOLN
INTRAVENOUS | Status: AC
  Filled 2023-07-15: qty 100

## 2023-07-15 MED ORDER — PROPOFOL 10 MG/ML IV BOLUS
INTRAVENOUS | Status: DC | PRN
  Administered 2023-07-15: 120 mg via INTRAVENOUS

## 2023-07-15 MED ORDER — CEFAZOLIN SODIUM-DEXTROSE 2-3 GM-%(50ML) IV SOLR
INTRAVENOUS | Status: DC | PRN
  Administered 2023-07-15: 2 g via INTRAVENOUS

## 2023-07-15 MED ORDER — FENTANYL CITRATE (PF) 250 MCG/5ML IJ SOLN
INTRAMUSCULAR | Status: DC | PRN
  Administered 2023-07-15 (×5): 50 ug via INTRAVENOUS

## 2023-07-15 MED ORDER — PROPOFOL 10 MG/ML IV BOLUS
INTRAVENOUS | Status: AC
  Filled 2023-07-15: qty 20

## 2023-07-15 MED ORDER — LIDOCAINE 2% (20 MG/ML) 5 ML SYRINGE
INTRAMUSCULAR | Status: DC | PRN
  Administered 2023-07-15: 80 mg via INTRAVENOUS

## 2023-07-15 MED ORDER — DEXAMETHASONE SODIUM PHOSPHATE 10 MG/ML IJ SOLN
INTRAMUSCULAR | Status: DC | PRN
  Administered 2023-07-15: 5 mg via INTRAVENOUS

## 2023-07-15 MED ORDER — CEFAZOLIN SODIUM 1 G IJ SOLR
INTRAMUSCULAR | Status: AC
Start: 2023-07-15 — End: ?
  Filled 2023-07-15: qty 20

## 2023-07-15 MED ORDER — PHENYLEPHRINE 80 MCG/ML (10ML) SYRINGE FOR IV PUSH (FOR BLOOD PRESSURE SUPPORT)
PREFILLED_SYRINGE | INTRAVENOUS | Status: AC
  Filled 2023-07-15: qty 20

## 2023-07-15 MED ORDER — FENTANYL CITRATE (PF) 250 MCG/5ML IJ SOLN
INTRAMUSCULAR | Status: AC
  Filled 2023-07-15: qty 5

## 2023-07-15 MED ORDER — AMOXICILLIN-POT CLAVULANATE 875-125 MG PO TABS: 1.0000 | ORAL_TABLET | Freq: Two times a day (BID) | ORAL | 0 refills | Status: DC

## 2023-07-15 MED ORDER — LIDOCAINE HCL (PF) 1 % IJ SOLN
10.0000 mL | Freq: Once | INTRAMUSCULAR | Status: AC
  Administered 2023-07-15: 10 mL
  Filled 2023-07-15: qty 10

## 2023-07-15 MED ORDER — LIDOCAINE HCL (PF) 1 % IJ SOLN
INTRAMUSCULAR | Status: AC
Start: 2023-07-15 — End: 2023-07-15
  Administered 2023-07-15: 5 mL
  Filled 2023-07-15: qty 5

## 2023-07-15 MED ORDER — BUPIVACAINE HCL (PF) 0.25 % IJ SOLN
INTRAMUSCULAR | Status: AC
  Filled 2023-07-15: qty 20

## 2023-07-15 MED ORDER — ONDANSETRON HCL 4 MG/2ML IJ SOLN
INTRAMUSCULAR | Status: DC | PRN
  Administered 2023-07-15: 4 mg via INTRAVENOUS

## 2023-07-15 MED ORDER — LIDOCAINE-EPINEPHRINE (PF) 2 %-1:200000 IJ SOLN: 10.0000 mL | Freq: Once | INTRAMUSCULAR | Status: AC

## 2023-07-15 MED ORDER — LIDOCAINE-EPINEPHRINE (PF) 2 %-1:200000 IJ SOLN
INTRAMUSCULAR | Status: AC
Start: 2023-07-15 — End: 2023-07-15
  Administered 2023-07-15: 10 mL via INTRADERMAL
  Filled 2023-07-15: qty 20

## 2023-07-15 MED ORDER — LIDOCAINE HCL (PF) 1 % IJ SOLN: 5.0000 mL | Freq: Once | INTRAMUSCULAR | Status: AC

## 2023-07-15 MED ORDER — PHENYLEPHRINE HCL-NACL 20-0.9 MG/250ML-% IV SOLN
INTRAVENOUS | Status: DC | PRN
  Administered 2023-07-15: 80 ug via INTRAVENOUS

## 2023-07-15 MED ORDER — LIDOCAINE 2% (20 MG/ML) 5 ML SYRINGE
INTRAMUSCULAR | Status: AC
  Filled 2023-07-15: qty 15

## 2023-07-15 MED ORDER — SUGAMMADEX SODIUM 200 MG/2ML IV SOLN
INTRAVENOUS | Status: DC | PRN
  Administered 2023-07-15: 200 mg via INTRAVENOUS

## 2023-07-15 MED ORDER — ACETAMINOPHEN 10 MG/ML IV SOLN
INTRAVENOUS | Status: DC | PRN
  Administered 2023-07-15: 1000 mg via INTRAVENOUS

## 2023-07-15 SURGICAL SUPPLY — 48 items
BAG COUNTER SPONGE SURGICOUNT (BAG) ×1 IMPLANT
BNDG ELASTIC 2X5.8 VLCR STR LF (GAUZE/BANDAGES/DRESSINGS) ×1 IMPLANT
BNDG ELASTIC 3INX 5YD STR LF (GAUZE/BANDAGES/DRESSINGS) ×1 IMPLANT
BNDG ELASTIC 4X5.8 VLCR STR LF (GAUZE/BANDAGES/DRESSINGS) ×1 IMPLANT
BNDG ESMARK 4X9 LF (GAUZE/BANDAGES/DRESSINGS) IMPLANT
BNDG GAUZE DERMACEA FLUFF 4 (GAUZE/BANDAGES/DRESSINGS) ×2 IMPLANT
CHLORAPREP W/TINT 26 (MISCELLANEOUS) ×1 IMPLANT
CORD BIPOLAR FORCEPS 12FT (ELECTRODE) ×1 IMPLANT
COVER BACK TABLE 60X90IN (DRAPES) IMPLANT
COVER MAYO STAND STRL (DRAPES) ×1 IMPLANT
COVER SURGICAL LIGHT HANDLE (MISCELLANEOUS) ×1 IMPLANT
CUFF TOURN SGL QUICK 18X4 (TOURNIQUET CUFF) ×1 IMPLANT
CUFF TRNQT CYL 24X4X16.5-23 (TOURNIQUET CUFF) IMPLANT
DRAPE HALF SHEET 40X57 (DRAPES) ×1 IMPLANT
DRAPE SURG 17X23 STRL (DRAPES) ×1 IMPLANT
DRSG ADAPTIC 3X8 NADH LF (GAUZE/BANDAGES/DRESSINGS) ×1 IMPLANT
GAUZE SPONGE 4X4 12PLY STRL (GAUZE/BANDAGES/DRESSINGS) ×1 IMPLANT
GAUZE STRETCH 2X75IN STRL (MISCELLANEOUS) IMPLANT
GAUZE XEROFORM 1X8 LF (GAUZE/BANDAGES/DRESSINGS) ×1 IMPLANT
GAUZE XEROFORM 5X9 LF (GAUZE/BANDAGES/DRESSINGS) IMPLANT
GLOVE BIO SURGEON STRL SZ7 (GLOVE) ×1 IMPLANT
GLOVE BIOGEL PI IND STRL 7.0 (GLOVE) ×1 IMPLANT
GOWN STRL REUS W/ TWL XL LVL3 (GOWN DISPOSABLE) ×2 IMPLANT
KIT BASIN OR (CUSTOM PROCEDURE TRAY) ×1 IMPLANT
KIT TURNOVER KIT B (KITS) ×1 IMPLANT
NDL HYPO 25GX1X1/2 BEV (NEEDLE) IMPLANT
NDL HYPO 25X1 1.5 SAFETY (NEEDLE) ×1 IMPLANT
NEEDLE HYPO 25GX1X1/2 BEV (NEEDLE) ×1 IMPLANT
NEEDLE HYPO 25X1 1.5 SAFETY (NEEDLE) ×1 IMPLANT
NS IRRIG 1000ML POUR BTL (IV SOLUTION) ×1 IMPLANT
PACK ORTHO EXTREMITY (CUSTOM PROCEDURE TRAY) ×1 IMPLANT
PAD ABD 8X10 STRL (GAUZE/BANDAGES/DRESSINGS) ×1 IMPLANT
PAD ARMBOARD POSITIONER FOAM (MISCELLANEOUS) ×1 IMPLANT
PAD CAST 4YDX4 CTTN HI CHSV (CAST SUPPLIES) ×2 IMPLANT
PADDING CAST ABS COTTON 3X4 (CAST SUPPLIES) ×1 IMPLANT
PADDING CAST SYNTHETIC 3X4 NS (CAST SUPPLIES) IMPLANT
SPLINT PLASTER CAST XFAST 3X15 (CAST SUPPLIES) ×1 IMPLANT
SPONGE T-LAP 4X18 ~~LOC~~+RFID (SPONGE) ×1 IMPLANT
SUT SILK 2 0 SH (SUTURE) IMPLANT
SUT VICRYL RAPIDE 5/0 PC 1 (SUTURE) IMPLANT
SYR BULB EAR ULCER 3OZ GRN STR (SYRINGE) ×1 IMPLANT
SYR CONTROL 10ML LL (SYRINGE) IMPLANT
TOWEL GREEN STERILE (TOWEL DISPOSABLE) ×1 IMPLANT
TOWEL GREEN STERILE FF (TOWEL DISPOSABLE) ×2 IMPLANT
TUBE CONNECTING 12X1/4 (SUCTIONS) IMPLANT
UNDERPAD 30X36 HEAVY ABSORB (UNDERPADS AND DIAPERS) ×1 IMPLANT
WATER STERILE IRR 1000ML POUR (IV SOLUTION) ×1 IMPLANT
YANKAUER SUCT BULB TIP NO VENT (SUCTIONS) IMPLANT

## 2023-07-15 NOTE — ED Triage Notes (Signed)
 Pt with dog bite to LFA; dog is known to her and UTD on vaccines; significant gaping laceration noted

## 2023-07-15 NOTE — Anesthesia Procedure Notes (Signed)
 Procedure Name: Intubation Date/Time: 07/15/2023 11:23 PM  Performed by: Waynard Edwards, CRNAPre-anesthesia Checklist: Patient identified, Emergency Drugs available, Suction available and Patient being monitored Patient Re-evaluated:Patient Re-evaluated prior to induction Oxygen Delivery Method: Circle system utilized Preoxygenation: Pre-oxygenation with 100% oxygen Induction Type: IV induction Ventilation: Mask ventilation without difficulty Laryngoscope Size: Miller and 2 Grade View: Grade I Tube type: Oral Tube size: 7.0 mm Number of attempts: 1 Airway Equipment and Method: Stylet Placement Confirmation: ETT inserted through vocal cords under direct vision, positive ETCO2 and breath sounds checked- equal and bilateral Secured at: 22 cm Tube secured with: Tape Dental Injury: Teeth and Oropharynx as per pre-operative assessment

## 2023-07-15 NOTE — Anesthesia Preprocedure Evaluation (Addendum)
 Anesthesia Evaluation  Patient identified by MRN, date of birth, ID band Patient awake    Reviewed: Allergy & Precautions, H&P , NPO status , Patient's Chart, lab work & pertinent test results  Airway Mallampati: II  TM Distance: >3 FB Neck ROM: full    Dental  (+) Dental Advisory Given, Teeth Intact   Pulmonary asthma , neg recent URI, former smoker   breath sounds clear to auscultation       Cardiovascular hypertension, Pt. on medications + dysrhythmias Supra Ventricular Tachycardia  Rhythm:regular Rate:Normal     Neuro/Psych  PSYCHIATRIC DISORDERS Anxiety Depression     Neuromuscular disease    GI/Hepatic ,GERD  Medicated and Controlled,,  Endo/Other    Renal/GU      Musculoskeletal  (+) Arthritis ,  Fibromyalgia -  Abdominal   Peds  Hematology   Anesthesia Other Findings   Reproductive/Obstetrics                              Anesthesia Physical Anesthesia Plan  ASA: 3  Anesthesia Plan: General   Post-op Pain Management: Ofirmev IV (intra-op)*   Induction: Intravenous  PONV Risk Score and Plan: 3 and Ondansetron, Dexamethasone and Treatment may vary due to age or medical condition  Airway Management Planned: Oral ETT and LMA  Additional Equipment:   Intra-op Plan:   Post-operative Plan: Extubation in OR  Informed Consent: I have reviewed the patients History and Physical, chart, labs and discussed the procedure including the risks, benefits and alternatives for the proposed anesthesia with the patient or authorized representative who has indicated his/her understanding and acceptance.     Dental advisory given  Plan Discussed with: CRNA  Anesthesia Plan Comments:          Anesthesia Quick Evaluation

## 2023-07-15 NOTE — Interval H&P Note (Signed)
 History and Physical Interval Note:  07/15/2023 11:01 PM  Vanessa Harmon  has presented today for surgery, with the diagnosis of LEFT ARM WOUND.  The various methods of treatment have been discussed with the patient and family. After consideration of risks, benefits and other options for treatment, the patient has consented to  Procedure(s) with comments: WOUND EXPLORATION (Left) - ARM as a surgical intervention.  The patient's history has been reviewed, patient examined, no change in status, stable for surgery.  I have reviewed the patient's chart and labs.  Questions were answered to the patient's satisfaction.     Lovelle Deitrick

## 2023-07-15 NOTE — ED Provider Notes (Signed)
 [place] [facility] Provider Note   CSN: [id number] Arrival date & time: 07/15/23  1758     History  Chief Complaint  Patient presents with   Animal Bite    Vanessa Harmon is a 73 y.o. female.  Patient is 73 year old female who presents with a laceration to her left arm.  She says she got bitten by dog.  It was a friend's dog.  The dog is up-to-date on its rabies vaccines per her report.  She says that she does not get tetanus shots because she has had reactions in the past and does not want to get her tetanus shot updated today.  She denies any numbness or weakness to the hand.  No other injuries.       Home Medications Prior to Admission medications   Medication Sig Start Date End Date Taking? Authorizing Provider  albuterol (PROAIR HFA) 108 (90 Base) MCG/ACT inhaler Inhale two puffs every 4-6 hours if needed for cough or wheeze. 03/26/18   Kozlow, Alvira Philips, MD  amLODipine (NORVASC) 10 MG tablet TAKE ONE TABLET BY MOUTH DAILY 06/06/23   Jarold Motto, PA  aspirin EC 81 MG tablet Take 1 tablet (81 mg total) by mouth daily. Swallow whole. 06/05/20   Costella, Darci Current, PA-C  azelastine (ASTELIN) 0.1 % nasal spray Place 1 spray into both nostrils 2 (two) times daily. Use in each nostril as directed    [provider]  azithromycin (ZITHROMAX) 250 MG tablet Take by mouth. Takes 250 mg three times a week 11/09/22 11/09/23  [provider]  Bempedoic Acid (NEXLETOL) 180 MG TABS Take 1 tablet (180 mg total) by mouth daily. 03/14/23   Hilty, Lisette Abu, MD  budesonide (PULMICORT) 0.5 MG/2ML nebulizer solution Take by nebulization. 01/31/22   [provider]  Calcium Carb-Cholecalciferol (CALCIUM 600/VITAMIN D3 PO) Take 1 tablet by mouth daily.    [provider]  Cetirizine HCl (ZYRTEC ALLERGY) 10 MG CAPS     [provider]  denosumab (PROLIA) 60 MG/ML SOSY injection Inject 60 mg into the skin every 6 (six)  months. Courier to rheum: [address], [address], [place] [address]. Appt on 05/23/2023 05/18/23   Fuller Plan, MD  diphenhydrAMINE (BENADRYL) 25 MG tablet Take 50 mg by mouth daily as needed for allergies.    [provider]  docusate sodium (COLACE) 100 MG capsule 100 mg 2 (two) times daily.    [provider]  escitalopram (LEXAPRO) 20 MG tablet TAKE ONE TABLET BY MOUTH DAILY 05/08/23   Jarold Motto, PA  etanercept (ENBREL SURECLICK) 50 MG/ML injection Inject 50 mg into the skin once a week. 04/28/23   Rice, Jamesetta Orleans, MD  Evolocumab (REPATHA SURECLICK) 140 MG/ML SOAJ INJECT 1 DOSE INTO THE SKIN EVERY 14 (FOURTEEN) DAYS 08/19/22   Hilty, Lisette Abu, MD  ezetimibe (ZETIA) 10 MG tablet TAKE ONE TABLET BY MOUTH DAILY 01/19/23   Jarold Motto, PA  famotidine (PEPCID) 40 MG tablet TAKE 1 TABLET (40 MG TOTAL) BY MOUTH AT BEDTIME. 11/21/18   Kozlow, Alvira Philips, MD  furosemide (LASIX) 20 MG tablet TAKE ONE TABLET BY MOUTH DAILY 04/17/23   Jarold Motto, PA  gabapentin (NEURONTIN) 300 MG capsule TAKE ONE CAPSULE BY MOUTH AT BEDTIME 01/18/23   Jarold Motto, PA  guaiFENesin (MUCINEX) 600 MG 12 hr tablet Take 600 mg by mouth 2 (two) times daily as needed for cough or to loosen phlegm.    [provider]  HYDROcodone-acetaminophen (NORCO) 10-325 MG tablet 1 tablet every 6 (six) hours as needed.    [provider]  indomethacin (INDOCIN SR) 75 MG CR capsule TAKE ONE CAPSULE BY MOUTH DAILY WITH BREAKFAST 04/26/23   Jarold Motto, PA  ipratropium (ATROVENT) 0.03 % nasal spray Place 1 spray into both nostrils 2 (two) times daily as needed for rhinitis.    [provider]  irbesartan (AVAPRO) 300 MG tablet TAKE ONE TABLET BY MOUTH DAILY 05/03/23   Jarold Motto, PA  Lifitegrast Benay Spice) 5 % SOLN Place 1 drop into both eyes in the morning and at bedtime.     [provider]  melatonin 3 MG TABS tablet Take by mouth. 05/16/17   [provider]  methocarbamol (ROBAXIN) 500 MG tablet Take 1 tablet (500 mg total) by mouth every 6 (six) hours as needed for muscle spasms. 05/31/20   Costella, Darci Current, PA-C  montelukast (SINGULAIR) 10 MG tablet Take 1 tablet (10 mg total) by mouth at bedtime. 06/19/18   Kozlow, Alvira Philips, MD  Multiple Vitamin (MULTIVITAMIN) capsule Take 1 capsule by mouth daily.    [provider]  omeprazole (PRILOSEC) 40 MG capsule Take 1 capsule (40 mg total) by mouth daily. TAKE ONE CAPSULE BY MOUTH DAILY BEFORE BREAKFAST DAILY 10/27/22   Jarold Motto, PA  OVER THE COUNTER MEDICATION Place 1 application into both eyes at bedtime. Genteal overnight eye ointment    [provider]  polyethylene glycol powder (GLYCOLAX/MIRALAX) 17 GM/SCOOP powder Miralax 17 gram/dose oral powder    [provider]  predniSONE (DELTASONE) 5 MG tablet Take 1 tablet (5 mg total) by mouth daily with breakfast. 05/16/23   Rice, Jamesetta Orleans, MD  PRESCRIPTION MEDICATION Place 1 spray into the nose in the morning and at bedtime. Budesonide 0.5 mg / 2 mL Nasal rinse    [provider]  pseudoephedrine (SUDAFED) 30 MG tablet Take 30 mg by mouth every 4 (four) hours as needed for congestion.    [provider]  SYMBICORT 160-4.5 MCG/ACT inhaler Inhale 2 puffs into the lungs 2 (two) times daily.    [provider]  traZODone (DESYREL) 150 MG tablet TAKE ONE TABLET BY MOUTH DAILY 06/26/23   Jarold Motto, PA  valACYclovir (VALTREX) 1000 MG tablet Take 1,000 mg by mouth daily.  09/02/16   [provider]  vitamin C (ASCORBIC ACID) 500 MG tablet Take 500 mg by mouth daily.    [provider]      Allergies    Advair hfa [fluticasone-salmeterol], Statins, Breo ellipta [fluticasone furoate-vilanterol], Fosamax [alendronate sodium], Levaquin [levofloxacin], Infliximab, Spiriva handihaler [tiotropium bromide monohydrate], Verapamil, and Tape    Review of Systems   Review  of Systems  Constitutional:  Negative for fever.  Gastrointestinal:  Negative for nausea and vomiting.  Musculoskeletal:  Negative for arthralgias, back pain, joint swelling and neck pain.  Skin:  Positive for wound.  Neurological:  Negative for weakness, numbness and headaches.    Physical Exam Updated Vital Signs BP (!) 165/90 (BP Location: Right Arm)   Pulse 84   Temp 98.1 F (36.7 C) (Oral)   Resp 18   Ht 5\' 6"  (1.676 m)   Wt 78.5 kg   SpO2 96%   BMI 27.92 kg/m  Physical Exam Constitutional:      Appearance: She is well-developed.  HENT:     Head: Normocephalic and atraumatic.  Cardiovascular:     Rate and Rhythm: Normal rate.  Pulmonary:     Effort: Pulmonary effort is normal.  Musculoskeletal:        General: Tenderness present.     Cervical back: Normal range of motion and neck supple.     Comments: Patient has a large jagged laceration to the dorsal surface of her left forearm.  There is some smaller wounds on both the dorsal and the volar surface of the forearm that do not appear to need suturing.  No active bleeding.  She has normal sensation and motor function distally.  She has full range of motion of her fingers.  She has good strength with both flexion and extension of her wrist.  Radial pulse and capillary refill is intact.  Skin:    General: Skin is warm and dry.  Neurological:     Mental Status: She is alert and oriented to person, place, and time.     ED Results / Procedures / Treatments   Labs (all labs ordered are listed, but only abnormal results are displayed) Labs Reviewed - No data to display  EKG None  Radiology DG Forearm Left Result Date: 07/15/2023 CLINICAL DATA:  Dog bite. EXAM: LEFT FOREARM - 2 VIEW COMPARISON:  None Available. FINDINGS: No evidence of acute fracture. Volar plate and screw fixation of remote distal radius fracture. No wrist or elbow dislocation. Skin and soft tissue irregularity about the mid and distal forearm. No  radiopaque foreign body. IMPRESSION: 1. Skin and soft tissue irregularity about the mid and distal forearm. No radiopaque foreign body. 2. No acute fracture. Electronically Signed   By: Narda Rutherford M.D.   On: 07/15/2023 18:48    Procedures Procedures    Medications Ordered in ED Medications  lidocaine (PF) (XYLOCAINE) 1 % injection 10 mL (10 mLs Infiltration Given by Other 07/15/23 1852)  lidocaine-EPINEPHrine (XYLOCAINE W/EPI) 2 %-1:200000 (PF) injection 10 mL (10 mLs Intradermal Given 07/15/23 1957)  lidocaine (PF) (XYLOCAINE) 1 % injection 5 mL (5 mLs Other Given 07/15/23 1957)  tranexamic acid (CYKLOKAPRON) injection 500 mg (500 mg Topical Given 07/15/23 2135)    ED Course/ Medical Decision Making/ A&P                                 Medical Decision Making Amount and/or Complexity of Data Reviewed Radiology: ordered.  Risk Prescription drug management.   Patient is a 73 year old female who presents with a dog bite to her left arm.  The dog's bites are reportedly up-to-date.  She is refusing tetanus immunization because of prior reactions.  Initially looks like a deep skin tear type wound.  The wound was anesthetized with 1% lidocaine.  It was irrigated with large amount of sterile water.  It was then noted to have some small area of pulsatile bleeding under a small tear in the fascia.  I attempted multiple maneuvers to get this under control.  I initially injected some lidocaine with epinephrine to the area and hold direct pressure.  I tried to put in a figure-of-eight suture.  Also attempted to control it with thrombin gauze.  It would continue to bleed through pressure dressing.  I also tried some TXA soaked gauze.  I consulted with Dr. Frazier Butt with hand surgery.  He decided to go ahead and have the patient transferred to [facility] and take the patient to the OR for further exploration.  I did place thrombin gauze and a pressure dressing for transport.  It  seems to be holding  with a tight pressure dressing.  Patient was transferred via [facility].  CRITICAL CARE Performed by: Rolan Bucco Total critical care time: 90 minutes Critical care time was exclusive of separately billable procedures and treating other patients. Critical care was necessary to treat or prevent imminent or life-threatening deterioration. Critical care was time spent personally by me on the following activities: development of treatment plan with patient and/or surrogate as well as nursing, discussions with consultants, evaluation of patient's response to treatment, examination of patient, obtaining history from patient or surrogate, ordering and performing treatments and interventions, ordering and review of laboratory studies, ordering and review of radiographic studies, pulse oximetry and re-evaluation of patient's condition.   Final Clinical Impression(s) / ED Diagnoses Final diagnoses:  Dog bite, initial encounter    Rx / DC Orders ED Discharge Orders     None         Rolan Bucco, MD 07/15/23 2144

## 2023-07-15 NOTE — Discharge Instructions (Signed)
 Waylan Rocher, M.D. Hand Surgery  POST-OPERATIVE DISCHARGE INSTRUCTIONS   PRESCRIPTIONS: You may have been given a prescription to be taken as directed for post-operative pain control.  You may also take over the counter ibuprofen/aleve and tylenol for pain. Take this as directed on the packaging. Do not exceed 3000 mg tylenol/acetaminophen in 24 hours.  Ibuprofen 600-800 mg (3-4) tablets by mouth every 6 hours as needed for pain.  OR Aleve 2 tablets by mouth every 12 hours (twice daily) as needed for pain.  AND/OR Tylenol 1000 mg (2 tablets) every 8 hours as needed for pain.  Please use your pain medication carefully, as refills are limited and you may not be provided with one.  As stated above, please use over the counter pain medicine - it will also be helpful with decreasing your swelling.    ANESTHESIA: After your surgery, post-surgical discomfort or pain is likely. This discomfort can last several days to a few weeks. At certain times of the day your discomfort may be more intense.   Did you receive a nerve block?  A nerve block can provide pain relief for one hour to two days after your surgery. As long as the nerve block is working, you will experience little or no sensation in the area the surgeon operated on.  As the nerve block wears off, you will begin to experience pain or discomfort. It is very important that you begin taking your prescribed pain medication before the nerve block fully wears off. Treating your pain at the first sign of the block wearing off will ensure your pain is better controlled and more tolerable when full-sensation returns. Do not wait until the pain is intolerable, as the medicine will be less effective. It is better to treat pain in advance than to try and catch up.   General Anesthesia:  If you did not receive a nerve block during your surgery, you will need to start taking your pain medication shortly after your surgery and should continue  to do so as prescribed by your surgeon.     ICE AND ELEVATION: You may use ice for the first 48-72 hours, but it is not critical.   Motion of your fingers is very important to decrease the swelling.  Elevation, as much as possible for the next 48 hours, is critical for decreasing swelling as well as for pain relief. Elevation means when you are seated or lying down, you hand should be at or above your heart. When walking, the hand needs to be at or above the level of your elbow.  If the bandage gets too tight, it may need to be loosened. Please contact our office and we will instruct you in how to do this.    SURGICAL BANDAGES:  Keep your dressing and/or splint clean and dry at all times.  Do not remove until you are seen again in the office.  If careful, you may place a plastic bag over your bandage and tape the end to shower, but be careful, do not get your bandages wet.     HAND THERAPY:  You may not need any. If you do, we will begin this at your follow up visit in the clinic.    ACTIVITY AND WORK: You are encouraged to move any fingers which are not in the bandage.  Light use of the fingers is allowed to assist the other hand with daily hygiene and eating, but strong gripping or lifting is often uncomfortable and  should be avoided.  You might miss a variable period of time from work and hopefully this issue has been discussed prior to surgery. You may not do any heavy work with your affected hand for about 2 weeks.    [facility] Second Floor, [address] [place], [place] [address] [phone]

## 2023-07-15 NOTE — Op Note (Signed)
   Date of Surgery: 07/15/2023  INDICATIONS: Patient is a 73 y.o.-year-old female with dog bite to the left forearm with a large wound at the dorsal and ulnar aspect of the mid forearm.  She was seen in the ER where the wound was cleaned, however, the ER physician was unable to obtain hemostasis.  She presents this evening for wound exploration.  Risks, benefits, and alternatives to surgery were again discussed with the patient in the preoperative area. The patient wishes to proceed with surgery.  Informed consent was signed after our discussion.   PREOPERATIVE DIAGNOSIS:  Left forearm wound  POSTOPERATIVE DIAGNOSIS:  Left dorsal ulnar forearm wound measuring approx 6 x 6 cm  PROCEDURE:  Exploration of left forearm wound Ligation of small perforating vessel Secondary repair of complex forearm wound measuring 6 x 6 cm   SURGEON: Waylan Rocher, M.D.  ASSIST: None  ANESTHESIA:  general  IV FLUIDS AND URINE: See anesthesia.  ESTIMATED BLOOD LOSS: 10 mL.  IMPLANTS: * No implants in log *   DRAINS: None  COMPLICATIONS: None noted  DESCRIPTION OF PROCEDURE: The patient was met in the preoperative holding area where the surgical site was marked and the consent form was signed.  The patient was then taken to the operating room and transferred to the operating table.  All bony prominences were well padded.  A tourniquet was applied to the left upper arm.  General endotracheal anesthesia was induced.  The operative extremity was prepped and draped in the usual and sterile fashion.  A formal time-out was performed to confirm that this was the correct patient, surgery, side, and site.   Following formal timeout, the wound was gently explored.  The wound was at the dorsal and ulnar aspect of the mid forearm and was approximately 6 cm in length by 6 cm in width.  There was a large skin tear with quite friable thin skin.  There was a small defect in the dorsal extensor fascia.  The wound was  overall clean appearing without any foreign material.  There was a small perforating vessel at the ulnar most aspect of the wound with pulsatile bleeding.  This was coagulated with bipolar electrocautery.  The remainder of the wound was hemostatic.  The wound was then repaired using a 5-0 Vicryl Rapide suture.  Some portions of the wound were unable to be closed primarily secondary to the very thin and friable nature of her skin.  There were several smaller wounds each quite superficial skin tears.  The larger, primary wound was dressed with Xeroform and folded Kerlix.  The remaining small wounds were also dressed with Xeroform.  This was followed by cast padding and an Ace wrap.  The patient was reversed from anesthesia and extubated uneventfully.  They were transferred from the operating table to the postoperative bed.  All counts were correct x 2 at the end of the procedure.  The patient was then taken to the PACU in stable condition.   POSTOPERATIVE PLAN: She will be discharged to home with appropriate pain medication and oral antibiotics.  I will see her back in the office next week for a wound check.  Waylan Rocher, MD 11:55 PM

## 2023-07-15 NOTE — H&P (Signed)
 HAND SURGERY   HPI: Patient is a 73 y.o. female who presents with a dog bite to the left forearm.  She was seen in the ER where she was initially evaluated.  I was contacted because there was some bleeding coming from the depths of the wound.  The emergency room staff was unable to control the bleeding despite multiple attempts with the small sutures, hemostatic gauze, and compressive dressings.  She denies any pain in the extremity.  She has some numbness in her thumb at baseline but no new numbness or paresthesias in her hand.  Past Medical History:  Diagnosis Date   Adenomatous polyp    Allergy    Anxiety    takes Ativan daily as needed   Arthritis    inflammatory arthritis (Dr. Verdie Shire)   Asthma    Cataract    Chronic back pain    scoliosis    Constipation    takes miralax every other day   COVID 05/19/2022   Depression    takes Lexapro daily   Eczema    Essential hypertension, benign    takes Hyzaar and Metoprolol daily   Family history of adverse reaction to anesthesia    Sister PONV   Fibromyalgia    GERD (gastroesophageal reflux disease)    History of colon polyps    History of shingles    Hyperlipidemia    takes Atorvastatin daily   Insomnia    takes Melatonin nightly   Joint pain    Osteoarthritis    Osteoporosis    PSVT (paroxysmal supraventricular tachycardia) (HCC)    Recurrent upper respiratory infection (URI)    Rheumatoid arthritis (HCC)    sees Dr. Dierdre Forth; stable on Orencia   Rhinitis, allergic    uses Flonase daily   Urinary urgency    Urticaria    Past Surgical History:  Procedure Laterality Date   ADENOIDECTOMY     APPENDECTOMY     BACK SURGERY  2012   at age 3 d/t scoliosis   BLEPHAROPLASTY  2018   BREAST BIOPSY     cataract surgery     COLONOSCOPY     HARDWARE REMOVAL Left 02/24/2020   Procedure: HARDWARE REMOVAL LEFT DISTAL RADIUS;  Surgeon: Betha Loa, MD;  Location: [facility];  Service: Orthopedics;   Laterality: Left;   KNEE ARTHROSCOPY Bilateral    neuroma removed from foot     unsure of which foot   OPEN REDUCTION INTERNAL FIXATION (ORIF) DISTAL RADIAL FRACTURE Left 08/15/2019   Procedure: OPEN REDUCTION INTERNAL FIXATION (ORIF) DISTAL RADIAL FRACTURE;  Surgeon: Betha Loa, MD;  Location: [facility];  Service: Orthopedics;  Laterality: Left;  block in preop   POSTERIOR CERVICAL FUSION/FORAMINOTOMY N/A 05/29/2020   Procedure: Removal of hardware, Replacement of thoracic 8, thoracic 9, thoracic 10 screws, Right Thoracic 6-7, Right Thoracic 7-8 Laminectomies with thoracic hooks from Thoracic 4-5 to Thoracic 10;  Surgeon: Maeola Harman, MD;  Location: [facility] OR;  Service: Neurosurgery;  Laterality: N/A;   POSTERIOR LUMBAR FUSION 4 LEVEL N/A 02/15/2018   Procedure: Decompression and fusion Thoracic nine to Lumbar two with exploration of previous fusion;  Surgeon: Maeola Harman, MD;  Location: [facility] OR;  Service: Neurosurgery;  Laterality: N/A;   SHOULDER SURGERY Right    TONSILLECTOMY     TOTAL HIP ARTHROPLASTY Right 04/15/2014   Procedure: RIGHT TOTAL HIP ARTHROPLASTY ANTERIOR APPROACH;  Surgeon: Sheral Apley, MD;  Location: [facility] OR;  Service: Orthopedics;  Laterality:  Right;   TOTAL KNEE ARTHROPLASTY Left 11/26/2019   Procedure: TOTAL KNEE ARTHROPLASTY;  Surgeon: Durene Romans, MD;  Location: [facility] ORS;  Service: Orthopedics;  Laterality: Left;  70 mins   Social History   Socioeconomic History   Marital status: Married    Spouse name: Not on file   Number of children: Not on file   Years of education: Not on file   Highest education level: Bachelor's degree (e.g., BA, AB, BS)  Occupational History   Not on file  Tobacco Use   Smoking status: Former    Current packs/day: 0.00    Average packs/day: 1 pack/day for 20.0 years (20.0 ttl pk-yrs)    Types: Cigarettes    Start date: 55    Quit date: 25    Years since quitting: 27.2    Passive exposure: Never   Smokeless  tobacco: Never  Vaping Use   Vaping status: Never Used  Substance and Sexual Activity   Alcohol use: Not Currently    Comment: rarely with dinner   Drug use: No   Sexual activity: Yes    Birth control/protection: Post-menopausal  Other Topics Concern   Not on file  Social History Narrative   Retired Engineer, civil (consulting)   No children   Lives with husband   Social Drivers of Health   Financial Resource Strain: Low Risk  (04/30/2023)   Overall Financial Resource Strain (CARDIA)    Difficulty of Paying Living Expenses: Not hard at all  Food Insecurity: No Food Insecurity (04/30/2023)   Hunger Vital Sign    Worried About Running Out of Food in the Last Year: Never true    Ran Out of Food in the Last Year: Never true  Transportation Needs: No Transportation Needs (04/30/2023)   PRAPARE - Administrator, Civil Service (Medical): No    Lack of Transportation (Non-Medical): No  Physical Activity: Unknown (04/30/2023)   Exercise Vital Sign    Days of Exercise per Week: 0 days    Minutes of Exercise per Session: Not on file  Stress: No Stress Concern Present (04/30/2023)   [employer] of Occupational Health - Occupational Stress Questionnaire    Feeling of Stress : Not at all  Social Connections: Socially Isolated (04/30/2023)   Social Connection and Isolation Panel [NHANES]    Frequency of Communication with Friends and Family: Once a week    Frequency of Social Gatherings with Friends and Family: Once a week    Attends Religious Services: Never    Database administrator or Organizations: No    Attends Engineer, structural: Not on file    Marital Status: Married   Family History  Problem Relation Age of Onset   Atrial fibrillation Mother    Stroke Mother    Allergic rhinitis Mother    Allergic rhinitis Father    AAA (abdominal aortic aneurysm) Father    COPD Father    Prostate cancer Father    Atrial fibrillation Brother        ablation   Stomach cancer Paternal  Grandmother        some sort of abdominal cancer   Hypertension Other        unspecified grandmother   AAA (abdominal aortic aneurysm) Other        unspecified grandfather   Cancer Other        unspecified grandfather   Neuropathy Neg Hx    Colon cancer Neg Hx    Esophageal cancer Neg  Hx    Rectal cancer Neg Hx    - negative except otherwise stated in the family history section Allergies  Allergen Reactions   Advair Hfa [Fluticasone-Salmeterol] Other (See Comments)    Hoarness   Statins Other (See Comments)   Breo Ellipta [Fluticasone Furoate-Vilanterol] Other (See Comments)    Severe hoarseness    Fosamax [Alendronate Sodium] Other (See Comments)    myalgias   Levaquin [Levofloxacin] Other (See Comments)    Lightheadedness, not feeling well, ear pain   Infliximab Other (See Comments)    Skin reaction   Spiriva Handihaler [Tiotropium Bromide Monohydrate]     UNSPECIFIED REACTION    Verapamil    Tape Rash    PAPER TAPE: Causes severe rash and weeping   Prior to Admission medications   Medication Sig Start Date End Date Taking? Authorizing Provider  albuterol (PROAIR HFA) 108 (90 Base) MCG/ACT inhaler Inhale two puffs every 4-6 hours if needed for cough or wheeze. 03/26/18   Kozlow, Alvira Philips, MD  amLODipine (NORVASC) 10 MG tablet TAKE ONE TABLET BY MOUTH DAILY 06/06/23   Jarold Motto, PA  aspirin EC 81 MG tablet Take 1 tablet (81 mg total) by mouth daily. Swallow whole. 06/05/20   Costella, Darci Current, PA-C  azelastine (ASTELIN) 0.1 % nasal spray Place 1 spray into both nostrils 2 (two) times daily. Use in each nostril as directed    [provider]  azithromycin (ZITHROMAX) 250 MG tablet Take by mouth. Takes 250 mg three times a week 11/09/22 11/09/23  [provider]  Bempedoic Acid (NEXLETOL) 180 MG TABS Take 1 tablet (180 mg total) by mouth daily. 03/14/23   Hilty, Lisette Abu, MD  budesonide (PULMICORT) 0.5 MG/2ML nebulizer solution Take by nebulization.  01/31/22   [provider]  Calcium Carb-Cholecalciferol (CALCIUM 600/VITAMIN D3 PO) Take 1 tablet by mouth daily.    [provider]  Cetirizine HCl (ZYRTEC ALLERGY) 10 MG CAPS     [provider]  denosumab (PROLIA) 60 MG/ML SOSY injection Inject 60 mg into the skin every 6 (six) months. Courier to rheum: [address], [address], [place] [address]. Appt on 05/23/2023 05/18/23   Fuller Plan, MD  diphenhydrAMINE (BENADRYL) 25 MG tablet Take 50 mg by mouth daily as needed for allergies.    [provider]  docusate sodium (COLACE) 100 MG capsule 100 mg 2 (two) times daily.    [provider]  escitalopram (LEXAPRO) 20 MG tablet TAKE ONE TABLET BY MOUTH DAILY 05/08/23   Jarold Motto, PA  etanercept (ENBREL SURECLICK) 50 MG/ML injection Inject 50 mg into the skin once a week. 04/28/23   Rice, Jamesetta Orleans, MD  Evolocumab (REPATHA SURECLICK) 140 MG/ML SOAJ INJECT 1 DOSE INTO THE SKIN EVERY 14 (FOURTEEN) DAYS 08/19/22   Hilty, Lisette Abu, MD  ezetimibe (ZETIA) 10 MG tablet TAKE ONE TABLET BY MOUTH DAILY 01/19/23   Jarold Motto, PA  famotidine (PEPCID) 40 MG tablet TAKE 1 TABLET (40 MG TOTAL) BY MOUTH AT BEDTIME. 11/21/18   Kozlow, Alvira Philips, MD  furosemide (LASIX) 20 MG tablet TAKE ONE TABLET BY MOUTH DAILY 04/17/23   Jarold Motto, PA  gabapentin (NEURONTIN) 300 MG capsule TAKE ONE CAPSULE BY MOUTH AT BEDTIME 01/18/23   Jarold Motto, PA  guaiFENesin (MUCINEX) 600 MG 12 hr tablet Take 600 mg by mouth 2 (two) times daily as needed for cough or to loosen phlegm.    [provider]  HYDROcodone-acetaminophen (NORCO) 10-325 MG tablet  1 tablet every 6 (six) hours as needed.    [provider]  indomethacin (INDOCIN SR) 75 MG CR capsule TAKE ONE CAPSULE BY MOUTH DAILY WITH BREAKFAST 04/26/23   Jarold Motto, PA  ipratropium (ATROVENT) 0.03 % nasal spray Place 1 spray into both nostrils 2 (two) times daily as needed for rhinitis.     [provider]  irbesartan (AVAPRO) 300 MG tablet TAKE ONE TABLET BY MOUTH DAILY 05/03/23   Jarold Motto, PA  Lifitegrast Benay Spice) 5 % SOLN Place 1 drop into both eyes in the morning and at bedtime.     [provider]  melatonin 3 MG TABS tablet Take by mouth. 05/16/17   [provider]  methocarbamol (ROBAXIN) 500 MG tablet Take 1 tablet (500 mg total) by mouth every 6 (six) hours as needed for muscle spasms. 05/31/20   Costella, Darci Current, PA-C  montelukast (SINGULAIR) 10 MG tablet Take 1 tablet (10 mg total) by mouth at bedtime. 06/19/18   Kozlow, Alvira Philips, MD  Multiple Vitamin (MULTIVITAMIN) capsule Take 1 capsule by mouth daily.    [provider]  omeprazole (PRILOSEC) 40 MG capsule Take 1 capsule (40 mg total) by mouth daily. TAKE ONE CAPSULE BY MOUTH DAILY BEFORE BREAKFAST DAILY 10/27/22   Jarold Motto, PA  OVER THE COUNTER MEDICATION Place 1 application into both eyes at bedtime. Genteal overnight eye ointment    [provider]  polyethylene glycol powder (GLYCOLAX/MIRALAX) 17 GM/SCOOP powder Miralax 17 gram/dose oral powder    [provider]  predniSONE (DELTASONE) 5 MG tablet Take 1 tablet (5 mg total) by mouth daily with breakfast. 05/16/23   Rice, Jamesetta Orleans, MD  PRESCRIPTION MEDICATION Place 1 spray into the nose in the morning and at bedtime. Budesonide 0.5 mg / 2 mL Nasal rinse    [provider]  pseudoephedrine (SUDAFED) 30 MG tablet Take 30 mg by mouth every 4 (four) hours as needed for congestion.    [provider]  SYMBICORT 160-4.5 MCG/ACT inhaler Inhale 2 puffs into the lungs 2 (two) times daily.    [provider]  traZODone (DESYREL) 150 MG tablet TAKE ONE TABLET BY MOUTH DAILY 06/26/23   Jarold Motto, PA  valACYclovir (VALTREX) 1000 MG tablet Take 1,000 mg by mouth daily.  09/02/16   [provider]  vitamin C (ASCORBIC ACID) 500 MG tablet Take 500 mg by mouth daily.     [provider]   DG Forearm Left Result Date: 07/15/2023 CLINICAL DATA:  Dog bite. EXAM: LEFT FOREARM - 2 VIEW COMPARISON:  None Available. FINDINGS: No evidence of acute fracture. Volar plate and screw fixation of remote distal radius fracture. No wrist or elbow dislocation. Skin and soft tissue irregularity about the mid and distal forearm. No radiopaque foreign body. IMPRESSION: 1. Skin and soft tissue irregularity about the mid and distal forearm. No radiopaque foreign body. 2. No acute fracture. Electronically Signed   By: Narda Rutherford M.D.   On: 07/15/2023 18:48   - Positive ROS: All other systems have been reviewed and were otherwise negative with the exception of those mentioned in the HPI and as above.  Physical Exam: General: No acute distress, resting comfortably Cardiovascular: BUE warm and well perfused, normal rate Respiratory: Normal WOB on RA Skin: Warm and dry Neurologic: Sensation intact distally Psychiatric: Patient is at baseline mood and affect  Left upper Extremity  Large bulky dressing in place on the forearm.  Her hand is warm and well-perfused  with brisk capillary refill in all fingers.  She has a palpable radial pulse.  She has diminished sensation in her thumb at baseline but intact sensation throughout the remainder of the hand.  She has full and painless active range of motion of all fingers.   Assessment: 73 year old female with a dog bite to the left forearm with continuous bleeding despite multiple efforts that hemostasis by the ER.  Plan: OR today for exploration of the wound with potential ligation of a small vessel(s) as needed.. We again reviewed the risks of surgery which include bleeding, infection, damage to neurovascular structures, persistent symptoms, delayed wound healing, need for additional surgery.  Informed consent was signed.  All questions were answered.   Marlyne Beards, M.D. [facility] 10:59 PM

## 2023-07-15 NOTE — ED Notes (Signed)
 [facility] called for transport.

## 2023-07-16 ENCOUNTER — Encounter (HOSPITAL_COMMUNITY): Payer: Self-pay | Admitting: Orthopedic Surgery

## 2023-07-16 DIAGNOSIS — F418 Other specified anxiety disorders: Secondary | ICD-10-CM | POA: Diagnosis not present

## 2023-07-16 DIAGNOSIS — Z79899 Other long term (current) drug therapy: Secondary | ICD-10-CM | POA: Diagnosis not present

## 2023-07-16 DIAGNOSIS — S51812A Laceration without foreign body of left forearm, initial encounter: Secondary | ICD-10-CM | POA: Diagnosis not present

## 2023-07-16 DIAGNOSIS — M797 Fibromyalgia: Secondary | ICD-10-CM | POA: Diagnosis not present

## 2023-07-16 DIAGNOSIS — I10 Essential (primary) hypertension: Secondary | ICD-10-CM | POA: Diagnosis not present

## 2023-07-16 DIAGNOSIS — W540XXA Bitten by dog, initial encounter: Secondary | ICD-10-CM | POA: Diagnosis not present

## 2023-07-16 DIAGNOSIS — K219 Gastro-esophageal reflux disease without esophagitis: Secondary | ICD-10-CM | POA: Diagnosis not present

## 2023-07-16 DIAGNOSIS — Z87891 Personal history of nicotine dependence: Secondary | ICD-10-CM | POA: Diagnosis not present

## 2023-07-16 DIAGNOSIS — S51852A Open bite of left forearm, initial encounter: Secondary | ICD-10-CM | POA: Diagnosis not present

## 2023-07-16 MED ORDER — AMOXICILLIN-POT CLAVULANATE 875-125 MG PO TABS: 1.0000 | ORAL_TABLET | Freq: Two times a day (BID) | ORAL | 0 refills | Status: AC

## 2023-07-16 MED ORDER — FENTANYL CITRATE (PF) 100 MCG/2ML IJ SOLN: 25.0000 ug | INTRAMUSCULAR | Status: DC | PRN

## 2023-07-16 MED ORDER — DROPERIDOL 2.5 MG/ML IJ SOLN: 0.6250 mg | Freq: Once | INTRAMUSCULAR | Status: DC | PRN

## 2023-07-16 NOTE — Transfer of Care (Signed)
 Immediate Anesthesia Transfer of Care Note  Patient: Vanessa Harmon  Procedure(s) Performed: WOUND EXPLORATION (Left: Arm Lower)  Patient Location: PACU  Anesthesia Type:General  Level of Consciousness: awake, alert , oriented, and patient cooperative  Airway & Oxygen Therapy: Patient Spontanous Breathing and Patient connected to nasal cannula oxygen  Post-op Assessment: Report given to RN, Post -op Vital signs reviewed and stable, and Patient moving all extremities X 4  Post vital signs: Reviewed and stable  Last Vitals:  Vitals Value Taken Time  BP 155/71 07/16/23 0002  Temp    Pulse 75 07/16/23 0005  Resp 12 07/16/23 0005  SpO2 99 % 07/16/23 0005  Vitals shown include unfiled device data.  Last Pain:  Vitals:   07/15/23 2152  TempSrc: Oral  PainSc: 5          Complications: No notable events documented.

## 2023-07-16 NOTE — Anesthesia Postprocedure Evaluation (Signed)
 Anesthesia Post Note  Patient: Vanessa Harmon  Procedure(s) Performed: WOUND EXPLORATION (Left: Arm Lower)     Patient location during evaluation: PACU Anesthesia Type: General Level of consciousness: sedated and patient cooperative Pain management: pain level controlled Vital Signs Assessment: post-procedure vital signs reviewed and stable Respiratory status: spontaneous breathing Cardiovascular status: stable Anesthetic complications: no   No notable events documented.  Last Vitals:  Vitals:   07/16/23 0020 07/16/23 0035  BP: (!) 146/94 (!) 159/74  Pulse: 78 79  Resp: 17 20  Temp:  36.9 C  SpO2: 99% 99%    Last Pain:  Vitals:   07/16/23 0035  TempSrc:   PainSc: 0-No pain                 Lewie Loron

## 2023-07-17 DIAGNOSIS — M545 Low back pain, unspecified: Secondary | ICD-10-CM | POA: Diagnosis not present

## 2023-07-19 ENCOUNTER — Other Ambulatory Visit: Payer: Self-pay

## 2023-07-20 ENCOUNTER — Other Ambulatory Visit: Payer: Self-pay

## 2023-07-20 ENCOUNTER — Other Ambulatory Visit: Payer: Self-pay | Admitting: Physician Assistant

## 2023-07-20 ENCOUNTER — Other Ambulatory Visit: Payer: Self-pay | Admitting: Internal Medicine

## 2023-07-20 DIAGNOSIS — M0600 Rheumatoid arthritis without rheumatoid factor, unspecified site: Secondary | ICD-10-CM

## 2023-07-20 DIAGNOSIS — Z79899 Other long term (current) drug therapy: Secondary | ICD-10-CM

## 2023-07-20 NOTE — Progress Notes (Signed)
 Specialty Pharmacy Refill Coordination Note  Vanessa Harmon is a 73 y.o. female contacted today regarding refills of specialty medication(s) Etanercept (Enbrel SureClick)   Patient requested (Patient-Rptd) Delivery   Delivery date: (Patient-Rptd) 07/27/23   Verified address: (Patient-Rptd) [address], [place] [address]   Medication will be filled on 07/26/23.   This fill date is pending response to refill request from provider. Patient is aware and if they have not received fill by intended date they must follow up with pharmacy.

## 2023-07-20 NOTE — Telephone Encounter (Signed)
 Last Fill: 04/28/2023  Labs: 06/05/2023 eGFR 59 Glucose 200 Alkaline Phosphatase 42 RBC 3.88 MCV 99.3 MCH 32.9 Absolute Lymphocytes 0.7  TB Gold: 11/21/2022 Negative    Next Visit: 08/14/2023  Last Visit: 05/16/2023  [id number] rheumatoid arthritis (HCC)   Current Dose per office note 05/16/2023: Enbrel 50 mg subcu weekly   Okay to refill Enbrel?

## 2023-07-21 ENCOUNTER — Other Ambulatory Visit: Payer: Self-pay

## 2023-07-21 DIAGNOSIS — M545 Low back pain, unspecified: Secondary | ICD-10-CM | POA: Diagnosis not present

## 2023-07-21 NOTE — Progress Notes (Signed)
 Clinical Intervention Note  Clinical Intervention Notes: Patient reports taking Augmentin. No DDIs identified with Enbrel.   Clinical Intervention Outcomes: Prevention of an adverse drug event   Otto Herb Specialty Pharmacist

## 2023-07-24 DIAGNOSIS — M545 Low back pain, unspecified: Secondary | ICD-10-CM | POA: Diagnosis not present

## 2023-07-25 MED ORDER — ENBREL SURECLICK 50 MG/ML ~~LOC~~ SOAJ
50.0000 mg | SUBCUTANEOUS | 2 refills | Status: DC
  Filled 2023-07-26: qty 4, 28d supply, fill #0
  Filled 2023-08-24: qty 4, 28d supply, fill #1
  Filled 2023-10-10: qty 4, 28d supply, fill #2

## 2023-07-26 ENCOUNTER — Other Ambulatory Visit: Payer: Self-pay

## 2023-07-26 DIAGNOSIS — M545 Low back pain, unspecified: Secondary | ICD-10-CM | POA: Diagnosis not present

## 2023-07-27 ENCOUNTER — Other Ambulatory Visit: Payer: Self-pay | Admitting: Physician Assistant

## 2023-08-02 DIAGNOSIS — M545 Low back pain, unspecified: Secondary | ICD-10-CM | POA: Diagnosis not present

## 2023-08-03 NOTE — Progress Notes (Unsigned)
 Office Visit Note  Patient: Vanessa Harmon             Date of Birth: 1950-11-12           MRN: [id number]             PCP: Alexander Iba, PA Referring: Alexander Iba, PA Visit Date: 08/14/2023   Subjective:    Discussed the use of AI scribe software for clinical note transcription with the patient, who gave verbal consent to proceed.  History of Present Illness   Vanessa Harmon is a 73 y.o. female here for follow up for seronegative RA on Enbrel  50 mg weekly, prednisone  5 mg daily, and indomethacin  75 mg daily.   She is scheduled for a repeat surgery on Wednesday to address a dog bite wound on her left forearm that requires debridement. The initial injury involved significant bleeding due to a vessel tear, necessitating surgical intervention. She notes that the skin on her arm is thin, which may complicate suturing.  She is currently on Enbrel  for arthritis, and there is consideration about holding the medication before and after the procedure due to concerns about wound healing. She is also taking indomethacin  for pain management but plans to skip it on the day of the surgery to avoid increased bleeding risk.  Her arthritis is generally well-managed, although she experiences stiffness in her hands, attributed to osteoarthritis. No recent illnesses or infections are reported.  Also had latest prolia  injection 05/23/23 for osteoporosis management. This was uneventful. No new falls.  Previous HPI 05/16/2023 Vanessa Harmon is a 73 y.o. female here for follow up for seronegative RA on Enbrel  50 mg weekly, prednisone  5 mg daily, and indomethacin  75 mg daily.   They have been managing their rheumatoid arthritis with Enbrel , which they have been able to obtain on time without any gaps in administration. Over the past month, they have noticed significant improvement in their symptoms, with no swelling in their hands and resolution of pain in their knuckles. Previously, they had an  injection for their elbow, but since starting Enbrel , their elbow has not been problematic. They also had their shoulders injected a couple of weeks ago, which has been helpful in managing their symptoms.   They are currently taking 5 mg of prednisone  and indomethacin  once a day. Since switching to a citrate-free formulation of Enbrel , they have not experienced any more rashes or injection site irritation.   They had a cold in early January, which resolved without the need for antibiotics. The last time they experienced cold or upper respiratory symptoms was in June of the previous year. No recent upper respiratory infections aside from the cold in January.      Previous HPI 02/15/2023 Vanessa Harmon is a 73 y.o. female here for follow up for seronegative RA on prednisone  5 mg daily and indomethacin  75 mg daily and started Enbrel  in August.  So far she is not certain about the amount of difference with addition of the Enbrel .  Is not experiencing any injection related reactions.  She has had some additional viral infections that she is not sure if they are related to medicine or incidental.  She had bilateral shoulder steroid injections with Dr. Kermit Ped pain is getting worse again in these areas.  Also feels increase in left elbow pain that is similar to the episode of epicondylitis which we saw in March. She had called in about perioperative management of Enbrel  for surgery for toe joint  instability.  This was delayed because her husband had a dog hand bite with skin tearing around the same time. Also had mildly low hemoglobin on routine labs with her primary care PA.  Subsequent fecal occult stool card was positive for blood and so was referred to gastroenterology.  Has not had colonoscopy in years when she did there were no concerning findings.  She has a history of previous internal hemorrhoids.  She is still on the indomethacin .   Previous HPI 11/15/2022 Vanessa Harmon is a 73 y.o. female here  for follow up for seronegative RA on prednisone  5 mg daily and indomethacin  75 mg daily.  She stopped taking Rinvoq  since about 1 month ago as she felt this was worsening her frequency of upper respiratory infection and needing inhaled bronchodilator and breathing treatments.  Was also having increased symptoms during this time due to seasonal allergies.  Since she stopped the Rinvoq  feels this is improved although today is a bad day with persistent coughing.  Noticed small increase in joint pain of several areas including her shoulders and hips and knee.  She had repeat corticosteroid injection in both shoulders which has been improving her symptoms range of motion is better still pain with use.  Persistent bruising and skin rashes with a new avulsion on the left forearm but about the same as usual.   Labs reviewed 10/2022 WBC 11.9 Hgb 11.8 eGFR 47.27   DMARD Hx Methotrexate  - LFT elevations Remicade- Rashes Actemra  - Rashes Orencia  IV - Secondary nonresponder Rinvoq  - Respiratory symptoms worse     Previous HPI 09/21/2022 Vanessa Harmon is a 73 y.o. female here for follow up here for follow-up of seronegative RA on Rinvoq  15 mg p.o. daily and prednisone  5 mg daily.  The left elbow pain discussed at her last visit improved after steroid injection and has continued to do well.  Joint pain and inflammation doing pretty well overall chronic back pain is her biggest issue.  Continues having easy bruising and skin tears frequently from her 90 pound Rottweiler.  Recently sick with sinus infection for the past week is currently on amoxicillin  for this.  Not associated with any fevers lymphadenopathy or other constitutional symptoms.   Previous HPI 07/13/22 Vanessa Harmon is a 73 y.o. female here for follow up for seronegative RA on rinvoq  15 mg PO daily and prednisone  5 mg daily.  Since her last visit she finished getting over the persistent sinus infection symptoms after recent COVID illness.   However once the symptoms cleared up she started noticing increase in hip bursitis pain.  Also started getting worsening pain along the medial side of the left elbow.  Most noticeable when using her hands to help her self push off from a seated position due to her hip pain.  Not noticed any visible swelling or erythema she still has bruising around the elbow but that is more chronic and there are multiple skin tears in the area as well.  Does not feel like she is having flareup of increased joint symptoms outside of the hip and elbow pains.  She previously tried use of the elbow brace which we discussed before for her medial epicondylitis but just gets irritation around the skin and elbow does not feel much benefit.   Previous HPI 06/16/22 YSABELLE WEDDING is a 73 y.o. female here for follow up for seronegative RA on prednisone  5 mg daily and rinvoq  15 mg daily started since 2/18. Also prolia  for osteoporosis  last injection on 1/24 without complication. Main issue has been sickness with COVID positive test 2/8 and persistent symptoms. She had subsequent sinus infection treated with augmentin  starting 2/21 and had repeat labs checked yesterday. Brain fog, fatigue worse during COVID infection and partially improved since. She thinks rinvoq  is starting to help with swelling and pain in hands and wrists but still significant overall symptoms. Has some skin tears and bruising on arms from her dog.   Previous HPI 03/16/22 MALEIYA MCCLENTON is a 73 y.o. female here for follow up for seropositive RA on prednisone  5 mg daily. Methotrexate  was discontinued due to worsening laboratory function tests and Actemra  stopped due to developing palpable rashes throughout her legs similar to previous medication reaction to infliximab. She is also noticed easier bruising than usual and has seen discoloration of her nails with some separation and cracking.  Prednisone  is helping somewhat but still has ongoing joint pain and  stiffness specially off of her previous medications.  In particular bothering her lately has been persistent pain along the medial side of her left elbow does not recall any specific injury to this area but does feel like there is a sense of swelling and pressure.   Previous HPI 12/14/21 ERLINDA SARLEY is a 73 y.o. female here for follow up for seropositive RA on methotrexate  15 mg subcu weekly prednisone  5 mg daily leucovorin  1 mg daily and after starting Actemra  162 mg [place] q. 14 days.  She feels like arthritis symptoms have been well controlled on her current medications.  No significant flareups.  She still gets swelling in her wrist but no other visible joint swelling.  She has had pretty extensive bruising and easy skin tearing on both arms often from playing with her dog.  Lab tests checked in July did show appears to be a small decrease in estimated GFR.   Previous HPI 09/10/2021 MALETA LERMA is a 73 y.o. female here for evaluation of chronic back pain associated with seropositive RA she has been seeing Vinita Greenspan on treatment with orencia  IV, MTX 15 mg [place] weekly, leucovorin  1 mg daily, and prednisone  5 mg daily.  She was originally diagnosed with inflammatory arthritis by Dr. Alva Jewels about a decade ago.  Apparently the worst initial symptom at that time was left hip pain and inflammation with concerning findings on MRI.  Was started on methotrexate  as a steroid sparing DMARD treatment.  Apparently had initial concern for seronegative rheumatoid arthritis versus PMR presentation.  She did not tolerate methotrexate  well initially worst side effect was substantial hair loss.  Also did not tolerate well on leflunomide.  She was started on infliximab treatment developed a significant adverse reaction with new skin rashes in multiple areas and severe finger and toe nail changes that lasted about a year after discontinuing the medication.  She was started on IV Orencia  monthly infusions which has  been beneficial for symptoms but still notices some amount ongoing and that symptoms tend to worsen between each dosing interval.  She was restarted on methotrexate  this time with leucovorin  in the past few months and this has improved her joint inflammation further.  She remains on low-dose prednisone  5 mg daily throughout most of this time.  Most recent definite flareup of her rheumatoid arthritis was about 6 months ago. In addition to inflammatory joint disease she has significant amount of generalized osteoarthritis.  She reports bone-on-bone degenerative changes with slightly impaired range of motion in the left shoulder.  She  had previous left knee total arthroplasty.  She has degenerative arthritis at multiple levels in the spine with history of repeat lumbar spine surgery.  She has some spinal and foraminal stenosis with radicular symptoms.  She is also had recurrent problems with hip bursitis has been responsive to local steroid injection treatments with Dr. Bernard Brick.  She takes gabapentin  300 mg at night and as needed Robaxin  500 mg for the ongoing symptoms. She has not had any history with recurrent infections or major side effects on her current regimen.  She does have extensive bruising mostly worst in the past year and worst on extensor surfaces of arms and legs.  She denies any history of abnormal bleeding and blood clots. She has chronic dry eyes and sees Dr. Candi Chafe currently uses xiidra  and genteal drops for this.   Review of Systems  Constitutional:  Positive for fatigue.  HENT:  Positive for mouth dryness. Negative for mouth sores.   Eyes:  Positive for dryness.  Respiratory:  Negative for shortness of breath.   Cardiovascular:  Negative for chest pain and palpitations.  Gastrointestinal:  Positive for blood in stool and constipation. Negative for diarrhea.  Endocrine: Negative for increased urination.  Genitourinary:  Negative for involuntary urination.  Musculoskeletal:  Positive for  gait problem, myalgias, muscle tenderness and myalgias. Negative for joint pain, joint pain, joint swelling, muscle weakness and morning stiffness.  Skin:  Negative for color change, rash, hair loss and sensitivity to sunlight.  Allergic/Immunologic: Negative for susceptible to infections.  Neurological:  Negative for dizziness and headaches.  Hematological:  Negative for swollen glands.  Psychiatric/Behavioral:  Positive for depressed mood and sleep disturbance. The patient is nervous/anxious.     PMFS History:  Patient Active Problem List   Diagnosis Date Noted  . Age-related osteoporosis without current pathological fracture 06/16/2022  . Medial epicondylitis of left elbow 03/16/2022  . High risk medication use 09/10/2021  . Posterior vitreous detachment of left eye 09/29/2020  . Thoracic spondylosis with cord compression 05/29/2020  . Heterotopic ossification 03/30/2020  . Loosening of hardware in spine (HCC) 01/27/2020  . Posterior vitreous detachment of right eye 01/21/2020  . Pseudophakia 01/21/2020  . Horseshoe retinal tear of right eye 01/21/2020  . Body mass index (BMI) 28.0-28.9, adult 11/27/2019  . Left knee OA 11/26/2019  . Numbness of foot 02/07/2019  . Seronegative rheumatoid arthritis (HCC) 01/25/2019  . Peripheral polyneuropathy 01/14/2019  . Spinal stenosis, thoracolumbar region 01/14/2019  . Thoracogenic scoliosis, thoracolumbar region 01/14/2019  . Idiopathic scoliosis of thoracolumbar region 02/15/2018  . Rhinitis, chronic 05/16/2017  . Moderate persistent asthma without complication 05/04/2017  . History of polymyalgia rheumatica 02/15/2015  . DJD (degenerative joint disease) 04/15/2014  . Lumbar spine scoliosis 11/28/2013  . Degenerative spondylolisthesis 10/09/2013  . Lumbar radiculopathy 09/16/2013  . Lumbosacral radiculitis 07/31/2013  . Scoliosis, or kyphoscoliosis, idiopathic 07/31/2013  . Lumbar back pain 12/19/2011  . Hyperlipidemia 10/16/2010  .  Depression 10/16/2010  . Lumbar spondylosis 10/16/2010  . Gastroesophageal reflux disease 10/16/2010  . Adenomatous polyp of colon 10/16/2010  . History of paroxysmal supraventricular tachycardia 10/16/2010  . Essential (primary) hypertension 10/16/2010    Past Medical History:  Diagnosis Date  . Adenomatous polyp   . Allergy   . Anxiety    takes Ativan  daily as needed  . Arthritis    inflammatory arthritis (Dr. Julane Ny)  . Asthma   . Cataract   . Chronic back pain    scoliosis   .  Constipation    takes miralax  every other day  . COVID 05/19/2022  . Depression    takes Lexapro  daily  . Eczema   . Essential hypertension, benign    takes Hyzaar and Metoprolol  daily  . Family history of adverse reaction to anesthesia    Sister PONV  . Fibromyalgia   . GERD (gastroesophageal reflux disease)   . History of colon polyps   . History of shingles   . Hyperlipidemia    takes Atorvastatin  daily  . Insomnia    takes Melatonin nightly  . Joint pain   . Osteoarthritis   . Osteoporosis   . PSVT (paroxysmal supraventricular tachycardia) (HCC)   . Recurrent upper respiratory infection (URI)   . Rheumatoid arthritis (HCC)    sees Dr. Ebbie Goldmann; stable on Orencia   . Rhinitis, allergic    uses Flonase  daily  . Urinary urgency   . Urticaria     Family History  Problem Relation Age of Onset  . Atrial fibrillation Mother   . Stroke Mother   . Allergic rhinitis Mother   . Allergic rhinitis Father   . AAA (abdominal aortic aneurysm) Father   . COPD Father   . Prostate cancer Father   . Atrial fibrillation Brother        ablation  . Stomach cancer Paternal Grandmother        some sort of abdominal cancer  . Hypertension Other        unspecified grandmother  . AAA (abdominal aortic aneurysm) Other        unspecified grandfather  . Cancer Other        unspecified grandfather  . Neuropathy Neg Hx   . Colon cancer Neg Hx   . Esophageal cancer Neg Hx   . Rectal cancer  Neg Hx    Past Surgical History:  Procedure Laterality Date  . ADENOIDECTOMY    . APPENDECTOMY    . BACK SURGERY  2012   at age 61 d/t scoliosis  . BLEPHAROPLASTY  2018  . BREAST BIOPSY    . cataract surgery    . COLONOSCOPY    . HARDWARE REMOVAL Left 02/24/2020   Procedure: HARDWARE REMOVAL LEFT DISTAL RADIUS;  Surgeon: Brunilda Capra, MD;  Location: [facility];  Service: Orthopedics;  Laterality: Left;  . KNEE ARTHROSCOPY Bilateral   . neuroma removed from foot     unsure of which foot  . OPEN REDUCTION INTERNAL FIXATION (ORIF) DISTAL RADIAL FRACTURE Left 08/15/2019   Procedure: OPEN REDUCTION INTERNAL FIXATION (ORIF) DISTAL RADIAL FRACTURE;  Surgeon: Brunilda Capra, MD;  Location: [facility];  Service: Orthopedics;  Laterality: Left;  block in preop  . POSTERIOR CERVICAL FUSION/FORAMINOTOMY N/A 05/29/2020   Procedure: Removal of hardware, Replacement of thoracic 8, thoracic 9, thoracic 10 screws, Right Thoracic 6-7, Right Thoracic 7-8 Laminectomies with thoracic hooks from Thoracic 4-5 to Thoracic 10;  Surgeon: Manya Sells, MD;  Location: [facility] OR;  Service: Neurosurgery;  Laterality: N/A;  . POSTERIOR LUMBAR FUSION 4 LEVEL N/A 02/15/2018   Procedure: Decompression and fusion Thoracic nine to Lumbar two with exploration of previous fusion;  Surgeon: Manya Sells, MD;  Location: [facility] OR;  Service: Neurosurgery;  Laterality: N/A;  . SHOULDER SURGERY Right   . TONSILLECTOMY    . TOTAL HIP ARTHROPLASTY Right 04/15/2014   Procedure: RIGHT TOTAL HIP ARTHROPLASTY ANTERIOR APPROACH;  Surgeon: Saundra Curl, MD;  Location: [facility] OR;  Service: Orthopedics;  Laterality: Right;  . TOTAL KNEE  ARTHROPLASTY Left 11/26/2019   Procedure: TOTAL KNEE ARTHROPLASTY;  Surgeon: Claiborne Crew, MD;  Location: [facility];  Service: Orthopedics;  Laterality: Left;  70 mins  . VITRECTOMY Left 06/28/2023  . VITRECTOMY Right 08/09/2023  . WOUND EXPLORATION Left 07/15/2023   Procedure: WOUND  EXPLORATION;  Surgeon: Marilyn Shropshire, MD;  Location: [facility] OR;  Service: Orthopedics;  Laterality: Left;  ARM   Social History   Social History Narrative   Retired Engineer, civil (consulting)   No children   Lives with husband   Immunization History  Administered Date(s) Administered  . Fluad Trivalent(High Dose 65+) 02/27/2023  . Influenza Split 01/31/2012  . Influenza Whole 02/16/2010  . Influenza, High Dose Seasonal PF 12/13/2018, 02/18/2022  . Influenza,inj,Quad PF,6+ Mos 01/14/2014, 01/27/2015, 02/02/2016  . Influenza,inj,quad, With Preservative 02/26/2020  . Influenza-Unspecified 02/24/2021  . Moderna Covid-19 Fall Seasonal Vaccine 11yrs & older 03/04/2023  . Moderna Sars-Covid-2 Vaccination 02/20/2022  . PFIZER(Purple Top)SARS-COV-2 Vaccination 05/25/2019, 06/15/2019, 02/05/2020, 08/03/2020  . Pneumococcal Conjugate-13 01/27/2015  . Pneumococcal Polysaccharide-23 05/26/2017  . Tdap 10/04/2011  . Zoster Recombinant(Shingrix) 10/13/2017, 01/02/2018     Objective: Vital Signs: BP (!) 153/82 (BP Location: Right Arm, Patient Position: Sitting, Cuff Size: Normal)   Pulse 76   Resp 17   Ht 5\' 6"  (1.676 m)   Wt 177 lb (80.3 kg)   BMI 28.57 kg/m    Physical Exam Eyes:     Conjunctiva/sclera: Conjunctivae normal.  Cardiovascular:     Rate and Rhythm: Normal rate and regular rhythm.  Pulmonary:     Effort: Pulmonary effort is normal.     Breath sounds: Normal breath sounds.  Lymphadenopathy:     Cervical: No cervical adenopathy.  Skin:    General: Skin is warm and dry.     Findings: Bruising present.     Comments: Left forearm in dressing wrap  Neurological:     Mental Status: She is alert.  Psychiatric:        Mood and Affect: Mood normal.     Musculoskeletal Exam:  Shoulders full ROM no tenderness or swelling Elbows full ROM no tenderness or swelling Wrists full ROM no tenderness or swelling Fingers full ROM, some tenderness across PIPs, right 4th PIP palpable swelling Knees  full ROM no tenderness or swelling   Investigation: No additional findings.  Imaging: DG Forearm Left Result Date: 07/15/2023 CLINICAL DATA:  Dog bite. EXAM: LEFT FOREARM - 2 VIEW COMPARISON:  None Available. FINDINGS: No evidence of acute fracture. Volar plate and screw fixation of remote distal radius fracture. No wrist or elbow dislocation. Skin and soft tissue irregularity about the mid and distal forearm. No radiopaque foreign body. IMPRESSION: 1. Skin and soft tissue irregularity about the mid and distal forearm. No radiopaque foreign body. 2. No acute fracture. Electronically Signed   By: Chadwick Colonel M.D.   On: 07/15/2023 18:48    Recent Labs: Lab Results  Component Value Date   WBC 8.6 05/11/2023   HGB 13.3 05/11/2023   PLT 287.0 05/11/2023   NA 139 05/11/2023   K 4.4 05/11/2023   CL 101 05/11/2023   CO2 30 05/11/2023   GLUCOSE 133 (H) 05/11/2023   BUN 19 05/11/2023   CREATININE 1.08 05/11/2023   BILITOT 0.5 05/11/2023   ALKPHOS 33 (L) 05/11/2023   AST 16 05/11/2023   ALT 15 05/11/2023   PROT 6.7 05/11/2023   ALBUMIN 4.4 05/11/2023   CALCIUM  9.5 05/11/2023   GFRAA >60 11/27/2019   QFTBGOLDPLUS Negative 11/21/2022  Speciality Comments: No specialty comments available.  Procedures:  No procedures performed Allergies: Advair hfa [fluticasone -salmeterol], Statins, Breo ellipta  [fluticasone  furoate-vilanterol], Fosamax [alendronate sodium], Levaquin  [levofloxacin ], Infliximab, Spiriva  handihaler [tiotropium bromide  monohydrate], Verapamil, and Tape   Assessment / Plan:     Visit Diagnoses: Seronegative rheumatoid arthritis (HCC) - gabapentin  300 mg at night Rx with PCP office.  High risk medication use - Enbrel  50 mg subcu weekly, indomethacin  75 mg p.o. daily Upcoming surgery this week with anticipated preoperative labs. No serious interval infections. Discussed holding Enbrel  before and after surgery to minimize any risk of delayed wound healing or infection.  Indomethacin  just on day of surgery.  Age-related osteoporosis without current pathological fracture - On Prolia  had last dose February 4th.  On maintenance vitamin D  supplementation.  Plan for repeat bone density monitoring next July 2025. Dog bite with wound debridement Dog bite on forearm requiring repeat debridement. Concerns about wound healing due to thin skin and Enbrel  use. Discussed TNF inhibitors' impact on healing and surgical complications. - Hold Enbrel  for two doses, including today and the following week. - Avoid indomethacin  on the day of surgery.   Orders: No orders of the defined types were placed in this encounter.  No orders of the defined types were placed in this encounter.    Follow-Up Instructions: Return in about 3 months (around 11/13/2023) for RA on ENB/GC/NSAID f/u 3mos.   Matt Song, MD  Note - This record has been created using [employer].  Chart creation errors have been sought, but may not always  have been located. Such creation errors do not reflect on  the standard of medical care.

## 2023-08-07 DIAGNOSIS — M545 Low back pain, unspecified: Secondary | ICD-10-CM | POA: Diagnosis not present

## 2023-08-08 ENCOUNTER — Other Ambulatory Visit: Payer: Self-pay | Admitting: *Deleted

## 2023-08-08 MED ORDER — ESCITALOPRAM OXALATE 20 MG PO TABS: 20.0000 mg | ORAL_TABLET | Freq: Every day | ORAL | 1 refills | Status: DC

## 2023-08-09 DIAGNOSIS — H43311 Vitreous membranes and strands, right eye: Secondary | ICD-10-CM | POA: Diagnosis not present

## 2023-08-09 DIAGNOSIS — H33311 Horseshoe tear of retina without detachment, right eye: Secondary | ICD-10-CM | POA: Diagnosis not present

## 2023-08-14 ENCOUNTER — Ambulatory Visit: Payer: Medicare Other | Attending: Internal Medicine | Admitting: Internal Medicine

## 2023-08-14 ENCOUNTER — Encounter: Payer: Self-pay | Admitting: Internal Medicine

## 2023-08-14 ENCOUNTER — Encounter (HOSPITAL_BASED_OUTPATIENT_CLINIC_OR_DEPARTMENT_OTHER): Payer: Self-pay | Admitting: Orthopedic Surgery

## 2023-08-14 ENCOUNTER — Other Ambulatory Visit: Payer: Self-pay

## 2023-08-14 VITALS — BP 141/91 | HR 60 | Resp 17 | Ht 66.0 in | Wt 177.0 lb

## 2023-08-14 DIAGNOSIS — Z7952 Long term (current) use of systemic steroids: Secondary | ICD-10-CM | POA: Diagnosis not present

## 2023-08-14 DIAGNOSIS — Z79899 Other long term (current) drug therapy: Secondary | ICD-10-CM | POA: Diagnosis not present

## 2023-08-14 DIAGNOSIS — M7702 Medial epicondylitis, left elbow: Secondary | ICD-10-CM

## 2023-08-14 DIAGNOSIS — M0600 Rheumatoid arthritis without rheumatoid factor, unspecified site: Secondary | ICD-10-CM | POA: Diagnosis not present

## 2023-08-14 DIAGNOSIS — M810 Age-related osteoporosis without current pathological fracture: Secondary | ICD-10-CM

## 2023-08-15 ENCOUNTER — Encounter (HOSPITAL_BASED_OUTPATIENT_CLINIC_OR_DEPARTMENT_OTHER)
Admission: RE | Admit: 2023-08-15 | Discharge: 2023-08-15 | Disposition: A | Discharge status: Home / Self Care | Source: Ambulatory Visit | Attending: Orthopedic Surgery | Admitting: Orthopedic Surgery

## 2023-08-15 DIAGNOSIS — H43813 Vitreous degeneration, bilateral: Secondary | ICD-10-CM | POA: Diagnosis not present

## 2023-08-15 DIAGNOSIS — Z01812 Encounter for preprocedural laboratory examination: Secondary | ICD-10-CM | POA: Insufficient documentation

## 2023-08-15 DIAGNOSIS — H401132 Primary open-angle glaucoma, bilateral, moderate stage: Secondary | ICD-10-CM | POA: Diagnosis not present

## 2023-08-15 DIAGNOSIS — H33311 Horseshoe tear of retina without detachment, right eye: Secondary | ICD-10-CM | POA: Diagnosis not present

## 2023-08-15 LAB — BASIC METABOLIC PANEL WITH GFR
Anion gap: 8 (ref 5–15)
BUN: 13 mg/dL (ref 8–23)
CO2: 26 mmol/L (ref 22–32)
Calcium: 9.1 mg/dL (ref 8.9–10.3)
Chloride: 103 mmol/L (ref 98–111)
Creatinine, Ser: 1.03 mg/dL — ABNORMAL HIGH (ref 0.44–1.00)
GFR, Estimated: 57 mL/min — ABNORMAL LOW (ref >60)
Glucose, Bld: 112 mg/dL — ABNORMAL HIGH (ref 70–99)
Potassium: 4.8 mmol/L (ref 3.5–5.1)
Sodium: 137 mmol/L (ref 135–145)

## 2023-08-15 NOTE — Progress Notes (Signed)

## 2023-08-16 ENCOUNTER — Ambulatory Visit (HOSPITAL_BASED_OUTPATIENT_CLINIC_OR_DEPARTMENT_OTHER)
Admission: RE | Admit: 2023-08-16 | Discharge: 2023-08-16 | Disposition: A | Discharge status: Home / Self Care | Attending: Orthopedic Surgery | Admitting: Orthopedic Surgery

## 2023-08-16 ENCOUNTER — Other Ambulatory Visit: Payer: Self-pay

## 2023-08-16 ENCOUNTER — Encounter (HOSPITAL_BASED_OUTPATIENT_CLINIC_OR_DEPARTMENT_OTHER): Payer: Self-pay | Admitting: Orthopedic Surgery

## 2023-08-16 ENCOUNTER — Encounter (HOSPITAL_BASED_OUTPATIENT_CLINIC_OR_DEPARTMENT_OTHER)
Admission: RE | Disposition: A | Discharge status: Home / Self Care | Payer: Self-pay | Source: Home / Self Care | Attending: Orthopedic Surgery

## 2023-08-16 ENCOUNTER — Ambulatory Visit (HOSPITAL_BASED_OUTPATIENT_CLINIC_OR_DEPARTMENT_OTHER): Admitting: Anesthesiology

## 2023-08-16 DIAGNOSIS — Z7951 Long term (current) use of inhaled steroids: Secondary | ICD-10-CM | POA: Insufficient documentation

## 2023-08-16 DIAGNOSIS — W540XXD Bitten by dog, subsequent encounter: Secondary | ICD-10-CM | POA: Insufficient documentation

## 2023-08-16 DIAGNOSIS — F418 Other specified anxiety disorders: Secondary | ICD-10-CM

## 2023-08-16 DIAGNOSIS — S51802D Unspecified open wound of left forearm, subsequent encounter: Secondary | ICD-10-CM | POA: Insufficient documentation

## 2023-08-16 DIAGNOSIS — I10 Essential (primary) hypertension: Secondary | ICD-10-CM | POA: Diagnosis not present

## 2023-08-16 DIAGNOSIS — K219 Gastro-esophageal reflux disease without esophagitis: Secondary | ICD-10-CM | POA: Diagnosis not present

## 2023-08-16 DIAGNOSIS — S51802A Unspecified open wound of left forearm, initial encounter: Secondary | ICD-10-CM | POA: Diagnosis not present

## 2023-08-16 DIAGNOSIS — J45909 Unspecified asthma, uncomplicated: Secondary | ICD-10-CM | POA: Diagnosis not present

## 2023-08-16 DIAGNOSIS — Z7952 Long term (current) use of systemic steroids: Secondary | ICD-10-CM | POA: Diagnosis not present

## 2023-08-16 DIAGNOSIS — M797 Fibromyalgia: Secondary | ICD-10-CM | POA: Insufficient documentation

## 2023-08-16 DIAGNOSIS — Z87891 Personal history of nicotine dependence: Secondary | ICD-10-CM | POA: Diagnosis not present

## 2023-08-16 SURGERY — IRRIGATION AND DEBRIDEMENT WOUND
Anesthesia: General | Laterality: Left

## 2023-08-16 MED ORDER — LIDOCAINE HCL (CARDIAC) PF 100 MG/5ML IV SOSY
PREFILLED_SYRINGE | INTRAVENOUS | Status: DC | PRN
  Administered 2023-08-16: 60 mg via INTRAVENOUS

## 2023-08-16 MED ORDER — ONDANSETRON HCL 4 MG/2ML IJ SOLN
INTRAMUSCULAR | Status: DC | PRN
  Administered 2023-08-16: 4 mg via INTRAVENOUS

## 2023-08-16 MED ORDER — PROPOFOL 10 MG/ML IV BOLUS
INTRAVENOUS | Status: DC | PRN
  Administered 2023-08-16: 150 mg via INTRAVENOUS

## 2023-08-16 MED ORDER — OXYCODONE HCL 5 MG/5ML PO SOLN: 5.0000 mg | Freq: Once | ORAL | Status: DC | PRN

## 2023-08-16 MED ORDER — PHENYLEPHRINE HCL (PRESSORS) 10 MG/ML IV SOLN
INTRAVENOUS | Status: DC | PRN
  Administered 2023-08-16 (×2): 160 ug via INTRAVENOUS
  Administered 2023-08-16 (×2): 80 ug via INTRAVENOUS
  Administered 2023-08-16: 160 ug via INTRAVENOUS

## 2023-08-16 MED ORDER — CEFAZOLIN SODIUM-DEXTROSE 2-4 GM/100ML-% IV SOLN
INTRAVENOUS | Status: AC
  Filled 2023-08-16: qty 100

## 2023-08-16 MED ORDER — BACITRACIN 500 UNIT/GM EX OINT
TOPICAL_OINTMENT | CUTANEOUS | Status: DC | PRN
  Administered 2023-08-16: 1 via TOPICAL

## 2023-08-16 MED ORDER — BACITRACIN ZINC 500 UNIT/GM EX OINT
TOPICAL_OINTMENT | CUTANEOUS | Status: AC
  Filled 2023-08-16: qty 0.9

## 2023-08-16 MED ORDER — FENTANYL CITRATE (PF) 100 MCG/2ML IJ SOLN
INTRAMUSCULAR | Status: AC
Start: 2023-08-16 — End: ?
  Filled 2023-08-16: qty 2

## 2023-08-16 MED ORDER — FENTANYL CITRATE (PF) 100 MCG/2ML IJ SOLN
INTRAMUSCULAR | Status: DC | PRN
  Administered 2023-08-16: 50 ug via INTRAVENOUS

## 2023-08-16 MED ORDER — OXYCODONE HCL 5 MG PO TABS: 5.0000 mg | ORAL_TABLET | Freq: Once | ORAL | Status: DC | PRN

## 2023-08-16 MED ORDER — BACITRACIN ZINC 500 UNIT/GM EX OINT
TOPICAL_OINTMENT | CUTANEOUS | Status: AC
Start: 2023-08-16 — End: ?
  Filled 2023-08-16: qty 0.9

## 2023-08-16 MED ORDER — HYDROMORPHONE HCL 1 MG/ML IJ SOLN: 0.2500 mg | INTRAMUSCULAR | Status: DC | PRN

## 2023-08-16 MED ORDER — CEFAZOLIN SODIUM-DEXTROSE 2-4 GM/100ML-% IV SOLN
2.0000 g | INTRAVENOUS | Status: AC
  Administered 2023-08-16: 2 g via INTRAVENOUS

## 2023-08-16 MED ORDER — OXYCODONE HCL 5 MG PO TABS: 5.0000 mg | ORAL_TABLET | Freq: Four times a day (QID) | ORAL | 0 refills | Status: AC | PRN

## 2023-08-16 MED ORDER — SODIUM CHLORIDE 0.9 % IV SOLN
12.5000 mg | INTRAVENOUS | Status: DC | PRN
  Filled 2023-08-16: qty 0.5

## 2023-08-16 MED ORDER — LACTATED RINGERS IV SOLN: INTRAVENOUS | Status: DC

## 2023-08-16 MED ORDER — BUPIVACAINE HCL (PF) 0.25 % IJ SOLN
INTRAMUSCULAR | Status: DC | PRN
  Administered 2023-08-16: 15 mL

## 2023-08-16 MED ORDER — AMISULPRIDE (ANTIEMETIC) 5 MG/2ML IV SOLN: 10.0000 mg | Freq: Once | INTRAVENOUS | Status: DC | PRN

## 2023-08-16 SURGICAL SUPPLY — 32 items
BLADE SURG 15 STRL LF DISP TIS (BLADE) ×1 IMPLANT
BNDG COHESIVE 1X5 TAN NS LF (GAUZE/BANDAGES/DRESSINGS) IMPLANT
BNDG ELASTIC 2X5.8 VLCR NS LF (GAUZE/BANDAGES/DRESSINGS) IMPLANT
BNDG ELASTIC 3INX 5YD STR LF (GAUZE/BANDAGES/DRESSINGS) IMPLANT
BNDG ESMARK 4X9 LF (GAUZE/BANDAGES/DRESSINGS) ×1 IMPLANT
BNDG GAUZE DERMACEA FLUFF 4 (GAUZE/BANDAGES/DRESSINGS) ×1 IMPLANT
CHLORAPREP W/TINT 26 (MISCELLANEOUS) ×1 IMPLANT
CORD BIPOLAR FORCEPS 12FT (ELECTRODE) ×1 IMPLANT
COVER BACK TABLE 60X90IN (DRAPES) ×1 IMPLANT
CUFF TOURN SGL QUICK 18X4 (TOURNIQUET CUFF) IMPLANT
DRAIN PENROSE 12X.25 LTX STRL (MISCELLANEOUS) IMPLANT
DRAPE EXTREMITY T 121X128X90 (DISPOSABLE) ×1 IMPLANT
DRAPE SURG 17X23 STRL (DRAPES) ×1 IMPLANT
GAUZE XEROFORM 1X8 LF (GAUZE/BANDAGES/DRESSINGS) ×1 IMPLANT
GLOVE BIO SURGEON STRL SZ7 (GLOVE) ×1 IMPLANT
GLOVE BIOGEL PI IND STRL 7.0 (GLOVE) ×1 IMPLANT
GOWN STRL REUS W/ TWL LRG LVL3 (GOWN DISPOSABLE) ×2 IMPLANT
NDL HYPO 25X1 1.5 SAFETY (NEEDLE) ×1 IMPLANT
NEEDLE HYPO 25X1 1.5 SAFETY (NEEDLE) ×1 IMPLANT
NS IRRIG 1000ML POUR BTL (IV SOLUTION) ×1 IMPLANT
PACK BASIN DAY SURGERY FS (CUSTOM PROCEDURE TRAY) ×1 IMPLANT
PAD CAST 3X4 CTTN HI CHSV (CAST SUPPLIES) IMPLANT
SET IRRIG Y TYPE TUR BLADDER L (SET/KITS/TRAYS/PACK) IMPLANT
SHEET MEDIUM DRAPE 40X70 STRL (DRAPES) ×1 IMPLANT
SUT ETHILON 4 0 PS 2 18 (SUTURE) ×1 IMPLANT
SUT MNCRL AB 3-0 PS2 18 (SUTURE) IMPLANT
SUT MNCRL AB 4-0 PS2 18 (SUTURE) IMPLANT
SUT VIC AB 4-0 PS2 18 (SUTURE) IMPLANT
SYR BULB EAR ULCER 3OZ GRN STR (SYRINGE) ×1 IMPLANT
SYR CONTROL 10ML LL (SYRINGE) ×1 IMPLANT
TOWEL GREEN STERILE FF (TOWEL DISPOSABLE) ×2 IMPLANT
UNDERPAD 30X36 HEAVY ABSORB (UNDERPADS AND DIAPERS) ×1 IMPLANT

## 2023-08-16 NOTE — Anesthesia Postprocedure Evaluation (Signed)
 Anesthesia Post Note  Patient: Vanessa Harmon  Procedure(s) Performed: IRRIGATION AND DEBRIDEMENT WOUND (Left)     Patient location during evaluation: PACU Anesthesia Type: General Level of consciousness: awake and alert Pain management: pain level controlled Vital Signs Assessment: post-procedure vital signs reviewed and stable Respiratory status: spontaneous breathing, nonlabored ventilation, respiratory function stable and patient connected to nasal cannula oxygen  Cardiovascular status: blood pressure returned to baseline and stable Postop Assessment: no apparent nausea or vomiting Anesthetic complications: no   No notable events documented.  Last Vitals:  Vitals:   08/16/23 1300 08/16/23 1310  BP: (!) 142/63 (!) 162/58  Pulse: 70 70  Resp: 11 15  Temp:  36.4 C  SpO2: 94% 97%    Last Pain:  Vitals:   08/16/23 1310  TempSrc:   PainSc: 0-No pain                 Earvin Goldberg

## 2023-08-16 NOTE — Anesthesia Preprocedure Evaluation (Signed)
 Anesthesia Evaluation  Patient identified by MRN, date of birth, ID band Patient awake    Reviewed: Allergy & Precautions, H&P , NPO status , Patient's Chart, lab work & pertinent test results  Airway Mallampati: II  TM Distance: >3 FB Neck ROM: full    Dental  (+) Dental Advisory Given, Teeth Intact   Pulmonary asthma , neg recent URI, former smoker   breath sounds clear to auscultation       Cardiovascular hypertension, Pt. on medications + dysrhythmias Supra Ventricular Tachycardia  Rhythm:regular Rate:Normal     Neuro/Psych  PSYCHIATRIC DISORDERS Anxiety Depression     Neuromuscular disease    GI/Hepatic ,GERD  Medicated and Controlled,,  Endo/Other    Renal/GU      Musculoskeletal  (+) Arthritis , Rheumatoid disorders,  Fibromyalgia -  Abdominal   Peds  Hematology   Anesthesia Other Findings   Reproductive/Obstetrics                             Anesthesia Physical Anesthesia Plan  ASA: 3  Anesthesia Plan: General   Post-op Pain Management:    Induction: Intravenous  PONV Risk Score and Plan: 3 and Ondansetron , Dexamethasone , Treatment may vary due to age or medical condition and Midazolam   Airway Management Planned: LMA  Additional Equipment:   Intra-op Plan:   Post-operative Plan: Extubation in OR  Informed Consent: I have reviewed the patients History and Physical, chart, labs and discussed the procedure including the risks, benefits and alternatives for the proposed anesthesia with the patient or authorized representative who has indicated his/her understanding and acceptance.     Dental advisory given  Plan Discussed with: CRNA  Anesthesia Plan Comments:         Anesthesia Quick Evaluation

## 2023-08-16 NOTE — Op Note (Signed)
 Date of Surgery: 08/16/2023  INDICATIONS: Patient is a 73 y.o.-year-old female with left dorsal forearm wound after a dog bite one month ago.  She underwent irrigation and debridement of the wound in the operating room at the time of her injury.  Due to the nature of the wound and her very thin skin, I was unable to obtain a primary closure.  We attempted to treat this with daily wound care to allow healing by secondary intention.  Unfortunately, the wound has not healed.  She presents today for irrigation debridement with revision closure.  The risks, benefits, and alternatives to surgery were again discussed with the patient in the preoperative area. The patient wishes to proceed with surgery.  Informed consent was signed after our discussion.   PREOPERATIVE DIAGNOSIS:  Left dorsal forearm wound  POSTOPERATIVE DIAGNOSIS: Same.  PROCEDURE:  Irrigation and excisional debridement of nonhealing wound, wound approx 6 cm in length by 4 cm in width Secondary closure of wound dehiscence   SURGEON: Auburn Blaze, M.D.  ASSIST: None  ANESTHESIA:  general, local  IV FLUIDS AND URINE: See anesthesia.  ESTIMATED BLOOD LOSS: 10 mL.  IMPLANTS: * No implants in log *   DRAINS: None  COMPLICATIONS: None noted  DESCRIPTION OF PROCEDURE: The patient was met in the preoperative holding area where the surgical site was marked and the consent form was signed.  The patient was then taken to the operating room and remained on the stretcher.  A hand table was placed adjacent to the left upper extremity and locked into place.  All bony prominences were well padded.  A tourniquet was applied to the left upper arm.  General endotracheal anesthesia was induced.  The operative extremity was prepped and draped in the usual and sterile fashion.  A formal time-out was performed to confirm that this was the correct patient, surgery, side, and site.   Following formal timeout, the limb was exsanguinated with an  Esmarch bandage and the tourniquet inflated to 250 mmHg.  I began by making an elliptical incision along the margins of the open wound.  The wound was about 6 x 4 cm.  The skin and subcutaneous tissue was excised sharply.  There was abundant fibrous material in the wound bed.  This material, along with some nonviable fascial tissue, was sharply debrided using a combination of tenotomy scissor and rondure.  A healthy, bleeding wound bed was obtained.  The wound was then thoroughly irrigated with copious sterile saline via low flow cystoscopy tubing.  The wound was then closed in a layered fashion using a 4-0 Monocryl suture in buried interrupted fashion followed by a 4-0 nylon suture in combination of simple interrupted and horizontal mattress fashion.  A tension-free primary repair was obtained.  A local block was performed using 15 cc of quarter percent plain Marcaine .  The wound was then cleaned and dressed with Xeroform and bacitracin  ointment.  The tourniquet was deflated.  Hemostasis was achieved with direct pressure of the wound.  The wound then dressed with folded Kerlix, cast padding, and an Ace wrap.   The patient was reversed from anesthesia and extubated uneventfully.   All counts were correct x 2 at the end of the procedure.  The patient was then taken to the PACU in stable condition.   POSTOPERATIVE PLAN: She will be discharged to home with appropriate pain medication and discharge instructions.  I will see her back in the office next week for a wound check.  Vanessa Harmon  Vanessa Laos, MD 2:36 PM

## 2023-08-16 NOTE — H&P (Signed)
 HAND SURGERY   HPI: Patient is a 73 y.o. female who presents with a nonhealing wound to the dorsal aspect of the left mid forearm.  Patient's initial injury was a dog bite to the forearm.  She is seen in the ER where the wound was irrigated.  She had persistent bleeding from the wound and was transferred to the hospital for exploration.  A small artery was identified and coagulated.  I had difficulty closing the wound secondary to her thin skin and some missing tissue.  The skin was loosely reapproximated we attempted to allow this to heal by secondary intention with wound care.  Unfortunately, the wound has remained and is not healing.  She presents today for debridement and secondary closure..  Patient denies any changes to their medical history or new systemic symptoms today.    Past Medical History:  Diagnosis Date   Adenomatous polyp    Allergy    Anxiety    takes Ativan  daily as needed   Arthritis    inflammatory arthritis (Dr. Julane Ny)   Asthma    Cataract    Chronic back pain    scoliosis    Constipation    takes miralax  every other day   COVID 05/19/2022   Depression    takes Lexapro  daily   Eczema    Essential hypertension, benign    takes Hyzaar and Metoprolol  daily   Family history of adverse reaction to anesthesia    Sister PONV   Fibromyalgia    GERD (gastroesophageal reflux disease)    History of colon polyps    History of shingles    Hyperlipidemia    takes Atorvastatin  daily   Insomnia    takes Melatonin nightly   Joint pain    Osteoarthritis    Osteoporosis    PSVT (paroxysmal supraventricular tachycardia) (HCC)    Recurrent upper respiratory infection (URI)    Rheumatoid arthritis (HCC)    sees Dr. Ebbie Goldmann; stable on Orencia    Rhinitis, allergic    uses Flonase  daily   Urinary urgency    Urticaria    Past Surgical History:  Procedure Laterality Date   ADENOIDECTOMY     APPENDECTOMY     BACK SURGERY  2012   at age 84 d/t scoliosis    BLEPHAROPLASTY  2018   BREAST BIOPSY     cataract surgery     COLONOSCOPY     HARDWARE REMOVAL Left 02/24/2020   Procedure: HARDWARE REMOVAL LEFT DISTAL RADIUS;  Surgeon: Brunilda Capra, MD;  Location: [facility];  Service: Orthopedics;  Laterality: Left;   KNEE ARTHROSCOPY Bilateral    neuroma removed from foot     unsure of which foot   OPEN REDUCTION INTERNAL FIXATION (ORIF) DISTAL RADIAL FRACTURE Left 08/15/2019   Procedure: OPEN REDUCTION INTERNAL FIXATION (ORIF) DISTAL RADIAL FRACTURE;  Surgeon: Brunilda Capra, MD;  Location: [facility];  Service: Orthopedics;  Laterality: Left;  block in preop   POSTERIOR CERVICAL FUSION/FORAMINOTOMY N/A 05/29/2020   Procedure: Removal of hardware, Replacement of thoracic 8, thoracic 9, thoracic 10 screws, Right Thoracic 6-7, Right Thoracic 7-8 Laminectomies with thoracic hooks from Thoracic 4-5 to Thoracic 10;  Surgeon: Manya Sells, MD;  Location: [facility];  Service: Neurosurgery;  Laterality: N/A;   POSTERIOR LUMBAR FUSION 4 LEVEL N/A 02/15/2018   Procedure: Decompression and fusion Thoracic nine to Lumbar two with exploration of previous fusion;  Surgeon: Manya Sells, MD;  Location: [facility] OR;  Service: Neurosurgery;  Laterality: N/A;   SHOULDER SURGERY Right    TONSILLECTOMY     TOTAL HIP ARTHROPLASTY Right 04/15/2014   Procedure: RIGHT TOTAL HIP ARTHROPLASTY ANTERIOR APPROACH;  Surgeon: Saundra Curl, MD;  Location: [facility] OR;  Service: Orthopedics;  Laterality: Right;   TOTAL KNEE ARTHROPLASTY Left 11/26/2019   Procedure: TOTAL KNEE ARTHROPLASTY;  Surgeon: Claiborne Crew, MD;  Location: [facility];  Service: Orthopedics;  Laterality: Left;  70 mins   VITRECTOMY Left 06/28/2023   VITRECTOMY Right 08/09/2023   WOUND EXPLORATION Left 07/15/2023   Procedure: WOUND EXPLORATION;  Surgeon: Marilyn Shropshire, MD;  Location: [facility] OR;  Service: Orthopedics;  Laterality: Left;  ARM   Social History   Socioeconomic History   Marital  status: Married    Spouse name: Not on file   Number of children: Not on file   Years of education: Not on file   Highest education level: Bachelor's degree (e.g., BA, AB, BS)  Occupational History   Not on file  Tobacco Use   Smoking status: Former    Current packs/day: 0.00    Average packs/day: 1 pack/day for 20.0 years (20.0 ttl pk-yrs)    Types: Cigarettes    Start date: 64    Quit date: 8    Years since quitting: 27.3    Passive exposure: Never   Smokeless tobacco: Never  Vaping Use   Vaping status: Never Used  Substance and Sexual Activity   Alcohol  use: Yes    Comment: rarely with dinner   Drug use: No   Sexual activity: Yes    Birth control/protection: Post-menopausal  Other Topics Concern   Not on file  Social History Narrative   Retired Engineer, civil (consulting)   No children   Lives with husband   Social Drivers of Corporate investment banker Strain: Low Risk  (04/30/2023)   Overall Financial Resource Strain (CARDIA)    Difficulty of Paying Living Expenses: Not hard at all  Food Insecurity: No Food Insecurity (04/30/2023)   Hunger Vital Sign    Worried About Running Out of Food in the Last Year: Never true    Ran Out of Food in the Last Year: Never true  Transportation Needs: No Transportation Needs (04/30/2023)   PRAPARE - Administrator, Civil Service (Medical): No    Lack of Transportation (Non-Medical): No  Physical Activity: Unknown (04/30/2023)   Exercise Vital Sign    Days of Exercise per Week: 0 days    Minutes of Exercise per Session: Not on file  Stress: No Stress Concern Present (04/30/2023)   [employer] of Occupational Health - Occupational Stress Questionnaire    Feeling of Stress : Not at all  Social Connections: Socially Isolated (04/30/2023)   Social Connection and Isolation Panel [NHANES]    Frequency of Communication with Friends and Family: Once a week    Frequency of Social Gatherings with Friends and Family: Once a week    Attends  Religious Services: Never    Database administrator or Organizations: No    Attends Engineer, structural: Not on file    Marital Status: Married   Family History  Problem Relation Age of Onset   Atrial fibrillation Mother    Stroke Mother    Allergic rhinitis Mother    Allergic rhinitis Father    AAA (abdominal aortic aneurysm) Father    COPD Father    Prostate cancer Father    Atrial fibrillation Brother  ablation   Stomach cancer Paternal Grandmother        some sort of abdominal cancer   Hypertension Other        unspecified grandmother   AAA (abdominal aortic aneurysm) Other        unspecified grandfather   Cancer Other        unspecified grandfather   Neuropathy Neg Hx    Colon cancer Neg Hx    Esophageal cancer Neg Hx    Rectal cancer Neg Hx    - negative except otherwise stated in the family history section Allergies  Allergen Reactions   Advair Hfa [Fluticasone -Salmeterol] Other (See Comments)    Hoarness   Statins Other (See Comments)   Breo Ellipta  [Fluticasone  Furoate-Vilanterol] Other (See Comments)    Severe hoarseness    Fosamax [Alendronate Sodium] Other (See Comments)    myalgias   Levaquin  [Levofloxacin ] Other (See Comments)    Lightheadedness, not feeling well, ear pain   Infliximab Other (See Comments)    Skin reaction   Spiriva  Handihaler [Tiotropium Bromide  Monohydrate]     UNSPECIFIED REACTION    Verapamil    Tape Rash    PAPER TAPE: Causes severe rash and weeping   Prior to Admission medications   Medication Sig Start Date End Date Taking? Authorizing Provider  albuterol  (PROAIR  HFA) 108 (90 Base) MCG/ACT inhaler Inhale two puffs every 4-6 hours if needed for cough or wheeze. 03/26/18  Yes Kozlow, Rema Care, MD  amLODipine  (NORVASC ) 10 MG tablet TAKE ONE TABLET BY MOUTH DAILY 06/06/23  Yes Alexander Iba, PA  aspirin  EC 81 MG tablet Take 1 tablet (81 mg total) by mouth daily. Swallow whole. 06/05/20  Yes Costella, Vincent J, PA-C   azelastine  (ASTELIN ) 0.1 % nasal spray Place 1 spray into both nostrils 2 (two) times daily. Use in each nostril as directed   Yes [provider]  azithromycin (ZITHROMAX) 250 MG tablet Take by mouth. Takes 250 mg three times a week 11/09/22 11/09/23 Yes [provider]  Bempedoic Acid  (NEXLETOL ) 180 MG TABS Take 1 tablet (180 mg total) by mouth daily. 03/14/23  Yes Hilty, Aviva Lemmings, MD  budesonide  (PULMICORT ) 0.5 MG/2ML nebulizer solution Take by nebulization. 01/31/22  Yes [provider]  Calcium  Carb-Cholecalciferol  (CALCIUM  600/VITAMIN D3 PO) Take 1 tablet by mouth daily.   Yes [provider]  Cetirizine HCl (ZYRTEC ALLERGY) 10 MG CAPS    Yes [provider]  diphenhydrAMINE  (BENADRYL ) 25 MG tablet Take 50 mg by mouth daily as needed for allergies.   Yes [provider]  docusate sodium  (COLACE) 100 MG capsule 100 mg 2 (two) times daily.   Yes [provider]  escitalopram  (LEXAPRO ) 20 MG tablet Take 1 tablet (20 mg total) by mouth daily. 08/08/23  Yes Alexander Iba, PA  etanercept  (ENBREL  SURECLICK) 50 MG/ML injection Inject 50 mg into the skin once a week. 07/25/23  Yes Rice, Haig Levan, MD  Evolocumab  (REPATHA  SURECLICK) 140 MG/ML SOAJ INJECT 1 DOSE INTO THE SKIN EVERY 14 (FOURTEEN) DAYS 08/19/22  Yes Hilty, Aviva Lemmings, MD  ezetimibe  (ZETIA ) 10 MG tablet TAKE ONE TABLET BY MOUTH DAILY 07/21/23  Yes Roark Chick, [place], PA  famotidine  (PEPCID ) 40 MG tablet TAKE 1 TABLET (40 MG TOTAL) BY MOUTH AT BEDTIME. 11/21/18  Yes Kozlow, Rema Care, MD  furosemide  (LASIX ) 20 MG tablet TAKE ONE TABLET BY MOUTH DAILY 07/27/23  Yes Alexander Iba, PA  gabapentin  (NEURONTIN ) 300 MG capsule TAKE ONE CAPSULE BY MOUTH AT  BEDTIME 01/18/23  Yes Worley, Ova Bloomer, PA  HYDROcodone -acetaminophen  (NORCO) 10-325 MG tablet 1 tablet every 6 (six) hours as needed.   Yes [provider]  indomethacin  (INDOCIN  SR) 75 MG CR capsule TAKE ONE CAPSULE BY MOUTH DAILY  WITH BREAKFAST 07/27/23  Yes Worley, [place], PA  irbesartan  (AVAPRO ) 300 MG tablet TAKE ONE TABLET BY MOUTH DAILY 05/03/23  Yes Alexander Iba, PA  melatonin 3 MG TABS tablet Take by mouth. 05/16/17  Yes [provider]  methocarbamol  (ROBAXIN ) 500 MG tablet Take 1 tablet (500 mg total) by mouth every 6 (six) hours as needed for muscle spasms. 05/31/20  Yes Costella, Vincent J, PA-C  mometasone -formoterol  (DULERA) 100-5 MCG/ACT AERO Inhale 2 puffs into the lungs 2 (two) times daily.   Yes [provider]  montelukast  (SINGULAIR ) 10 MG tablet Take 1 tablet (10 mg total) by mouth at bedtime. 06/19/18  Yes Kozlow, Eric J, MD  Multiple Vitamin (MULTIVITAMIN) capsule Take 1 capsule by mouth daily.   Yes [provider]  omeprazole  (PRILOSEC) 40 MG capsule Take 1 capsule (40 mg total) by mouth daily. TAKE ONE CAPSULE BY MOUTH DAILY BEFORE BREAKFAST DAILY 10/27/22  Yes Alexander Iba, PA  predniSONE  (DELTASONE ) 5 MG tablet Take 1 tablet (5 mg total) by mouth daily with breakfast. 05/16/23  Yes Rice, Haig Levan, MD  pseudoephedrine  (SUDAFED) 30 MG tablet Take 30 mg by mouth every 4 (four) hours as needed for congestion.   Yes [provider]  traZODone  (DESYREL ) 150 MG tablet TAKE ONE TABLET BY MOUTH DAILY 06/26/23  Yes Alexander Iba, PA  valACYclovir  (VALTREX ) 1000 MG tablet Take 1,000 mg by mouth daily.  09/02/16  Yes [provider]  vitamin C  (ASCORBIC ACID ) 500 MG tablet Take 500 mg by mouth daily.   Yes [provider]  Carboxymethylcellulose Sod PF (REFRESH PLUS) 0.5 % SOLN as needed.    [provider]  denosumab  (PROLIA ) 60 MG/ML SOSY injection Inject 60 mg into the skin every 6 (six) months. Courier to rheum: [address], [address], [place] [address]. Appt on 05/23/2023 05/18/23   Matt Song, MD  guaiFENesin  (MUCINEX ) 600 MG 12 hr tablet Take 600 mg by mouth 2 (two) times daily as needed for cough or to loosen phlegm.     [provider]  ipratropium (ATROVENT ) 0.03 % nasal spray Place 1 spray into both nostrils 2 (two) times daily as needed for rhinitis.    [provider]  Lifitegrast  (XIIDRA ) 5 % SOLN Place 1 drop into both eyes in the morning and at bedtime.     [provider]  OVER THE COUNTER MEDICATION Place 1 application into both eyes at bedtime. Genteal overnight eye ointment    [provider]  polyethylene glycol powder (GLYCOLAX /MIRALAX ) 17 GM/SCOOP powder Miralax  17 gram/dose oral powder    [provider]  Povidone (IVIZIA DRY EYES OP) Apply to eye.    [provider]  PRED FORTE 1 % ophthalmic suspension Place 1 drop into the right eye 4 (four) times daily.    [provider]  PRESCRIPTION MEDICATION Place 1 spray into the nose in the morning and at bedtime. Budesonide  0.5 mg / 2 mL in the morning Nasal rinse, 1.0mg /72mL in the evening.    [provider]  SYMBICORT  160-4.5 MCG/ACT inhaler Inhale 2 puffs into the lungs 2 (two) times daily.    [provider]  tobramycin (TOBREX) 0.3 % ophthalmic solution Place 1 drop into the  right eye 4 (four) times daily.    [provider]   No results found. - Positive ROS: All other systems have been reviewed and were otherwise negative with the exception of those mentioned in the HPI and as above.  Physical Exam: General: No acute distress, resting comfortably Cardiovascular: BUE warm and well perfused, normal rate Respiratory: Normal WOB on RA Skin: Warm and dry Neurologic: Sensation intact distally Psychiatric: Patient is at baseline mood and affect  Left upper Extremity  Dressing is clean and dry.  She has full and painless active range of motion of the elbow, forearm, wrist, and fingers.  Sensation is intact to light touch in the median, ulnar, and radial nerves regions.  Her hand is warm and well-perfused with brisk capillary  refill.   Assessment: 73 year old female with nonhealing wound to the dorsal aspect of left forearm following a dog bite  Plan: OR today for radiation and debridement of the wound with secondary closure. We again reviewed the risks of surgery which include bleeding, infection, damage to neurovascular structures, persistent symptoms, delayed wound healing, need for additional surgery.  Informed consent was signed.  All questions were answered.   Marilyn Shropshire, M.D. [facility] 11:04 AM

## 2023-08-16 NOTE — Anesthesia Procedure Notes (Signed)
 Procedure Name: LMA Insertion Date/Time: 08/16/2023 11:38 AM  Performed by: Lonia Ro, CRNAPre-anesthesia Checklist: Patient identified, Emergency Drugs available, Suction available, Patient being monitored and Timeout performed Patient Re-evaluated:Patient Re-evaluated prior to induction Oxygen  Delivery Method: Circle system utilized Preoxygenation: Pre-oxygenation with 100% oxygen  Induction Type: IV induction Ventilation: Mask ventilation without difficulty LMA: LMA inserted LMA Size: 4.0 Number of attempts: 1 Placement Confirmation: positive ETCO2 and breath sounds checked- equal and bilateral Tube secured with: Tape Dental Injury: Teeth and Oropharynx as per pre-operative assessment

## 2023-08-16 NOTE — Transfer of Care (Signed)
 Immediate Anesthesia Transfer of Care Note  Patient: Kenn Payment  Procedure(s) Performed: IRRIGATION AND DEBRIDEMENT WOUND (Left)  Patient Location: PACU  Anesthesia Type:General  Level of Consciousness: drowsy and patient cooperative  Airway & Oxygen  Therapy: Patient Spontanous Breathing and Patient connected to face mask oxygen   Post-op Assessment: Report given to RN and Post -op Vital signs reviewed and stable  Post vital signs: Reviewed and stable  Last Vitals:  Vitals Value Taken Time  BP 136/73 08/16/23 1225  Temp 36.3 C 08/16/23 1225  Pulse 67 08/16/23 1227  Resp 12 08/16/23 1227  SpO2 95 % 08/16/23 1227  Vitals shown include unfiled device data.  Last Pain:  Vitals:   08/16/23 0941  TempSrc: Temporal  PainSc: 0-No pain      Patients Stated Pain Goal: 3 (08/16/23 0941)  Complications: No notable events documented.

## 2023-08-16 NOTE — Discharge Instructions (Addendum)
Waylan Rocher, M.D. Hand Surgery  POST-OPERATIVE DISCHARGE INSTRUCTIONS   PRESCRIPTIONS: You may have been given a prescription to be taken as directed for post-operative pain control.  You may also take over the counter ibuprofen/aleve and tylenol for pain. Take this as directed on the packaging. Do not exceed 3000 mg tylenol/acetaminophen in 24 hours.  Ibuprofen 600-800 mg (3-4) tablets by mouth every 6 hours as needed for pain.  OR Aleve 2 tablets by mouth every 12 hours (twice daily) as needed for pain.  AND/OR Tylenol 1000 mg (2 tablets) every 8 hours as needed for pain.  Please use your pain medication carefully, as refills are limited and you may not be provided with one.  As stated above, please use over the counter pain medicine - it will also be helpful with decreasing your swelling.    ANESTHESIA: After your surgery, post-surgical discomfort or pain is likely. This discomfort can last several days to a few weeks. At certain times of the day your discomfort may be more intense.   Did you receive a nerve block?  A nerve block can provide pain relief for one hour to two days after your surgery. As long as the nerve block is working, you will experience little or no sensation in the area the surgeon operated on.  As the nerve block wears off, you will begin to experience pain or discomfort. It is very important that you begin taking your prescribed pain medication before the nerve block fully wears off. Treating your pain at the first sign of the block wearing off will ensure your pain is better controlled and more tolerable when full-sensation returns. Do not wait until the pain is intolerable, as the medicine will be less effective. It is better to treat pain in advance than to try and catch up.   General Anesthesia:  If you did not receive a nerve block during your surgery, you will need to start taking your pain medication shortly after your surgery and should continue  to do so as prescribed by your surgeon.     ICE AND ELEVATION: You may use ice for the first 48-72 hours, but it is not critical.   Motion of your fingers is very important to decrease the swelling.  Elevation, as much as possible for the next 48 hours, is critical for decreasing swelling as well as for pain relief. Elevation means when you are seated or lying down, you hand should be at or above your heart. When walking, the hand needs to be at or above the level of your elbow.  If the bandage gets too tight, it may need to be loosened. Please contact our office and we will instruct you in how to do this.    SURGICAL BANDAGES:  Keep your dressing and/or splint clean and dry at all times.  Do not remove until you are seen again in the office.  If careful, you may place a plastic bag over your bandage and tape the end to shower, but be careful, do not get your bandages wet.     HAND THERAPY:  You may not need any. If you do, we will begin this at your follow up visit in the clinic.    ACTIVITY AND WORK: You are encouraged to move any fingers which are not in the bandage.  Light use of the fingers is allowed to assist the other hand with daily hygiene and eating, but strong gripping or lifting is often uncomfortable and  should be avoided.  You might miss a variable period of time from work and hopefully this issue has been discussed prior to surgery. You may not do any heavy work with your affected hand for about 2 weeks.    [facility] Second Floor, [address] [place], [place] [address] [phone]    Post Anesthesia Home Care Instructions  Activity: Get plenty of rest for the remainder of the day. A responsible individual must stay with you for 24 hours following the procedure.  For the next 24 hours, DO NOT: -Drive a car -Advertising copywriter -Drink alcoholic beverages -Take any medication unless instructed by your physician -Make any legal decisions or sign  important papers.  Meals: Start with liquid foods such as gelatin or soup. Progress to regular foods as tolerated. Avoid greasy, spicy, heavy foods. If nausea and/or vomiting occur, drink only clear liquids until the nausea and/or vomiting subsides. Call your physician if vomiting continues.  Special Instructions/Symptoms: Your throat may feel dry or sore from the anesthesia or the breathing tube placed in your throat during surgery. If this causes discomfort, gargle with warm salt water. The discomfort should disappear within 24 hours.  If you had a scopolamine patch placed behind your ear for the management of post- operative nausea and/or vomiting:  1. The medication in the patch is effective for 72 hours, after which it should be removed.  Wrap patch in a tissue and discard in the trash. Wash hands thoroughly with soap and water. 2. You may remove the patch earlier than 72 hours if you experience unpleasant side effects which may include dry mouth, dizziness or visual disturbances. 3. Avoid touching the patch. Wash your hands with soap and water after contact with the patch.

## 2023-08-17 ENCOUNTER — Encounter (HOSPITAL_BASED_OUTPATIENT_CLINIC_OR_DEPARTMENT_OTHER): Payer: Self-pay | Admitting: Orthopedic Surgery

## 2023-08-22 ENCOUNTER — Other Ambulatory Visit: Payer: Self-pay | Admitting: Internal Medicine

## 2023-08-22 DIAGNOSIS — M0600 Rheumatoid arthritis without rheumatoid factor, unspecified site: Secondary | ICD-10-CM

## 2023-08-22 NOTE — Telephone Encounter (Signed)
 Last Fill: 05/16/2023  Next Visit: 11/15/2023  Last Visit: 08/14/2023  Dx: Seronegative rheumatoid arthritis (HCC)   Current Dose per office note on 08/14/2023: prednisone  5 mg daily   Okay to refill Prednisone ?

## 2023-08-23 ENCOUNTER — Other Ambulatory Visit (HOSPITAL_COMMUNITY): Payer: Self-pay

## 2023-08-23 DIAGNOSIS — M545 Low back pain, unspecified: Secondary | ICD-10-CM | POA: Diagnosis not present

## 2023-08-24 ENCOUNTER — Other Ambulatory Visit: Payer: Self-pay

## 2023-08-24 NOTE — Progress Notes (Signed)
 Specialty Pharmacy Refill Coordination Note  Vanessa Harmon is a 73 y.o. female contacted today regarding refills of specialty medication(s) Etanercept  (Enbrel  SureClick)   Patient requested (Patient-Rptd) Delivery   Delivery date: 09/12/23   [place]Rpt[address] [place] [address]   Medication will be filled on 05.26.25. Patient notified of delivery change via mychart.

## 2023-08-25 ENCOUNTER — Other Ambulatory Visit: Payer: Self-pay

## 2023-08-25 NOTE — Progress Notes (Signed)
 Patient was notified that due to the upcoming Memorial day holiday, medication fill date has been rescheduled for 09/12/23 and delivered date will be 09/13/23

## 2023-08-28 DIAGNOSIS — M545 Low back pain, unspecified: Secondary | ICD-10-CM | POA: Diagnosis not present

## 2023-09-01 DIAGNOSIS — M545 Low back pain, unspecified: Secondary | ICD-10-CM | POA: Diagnosis not present

## 2023-09-05 DIAGNOSIS — M545 Low back pain, unspecified: Secondary | ICD-10-CM | POA: Diagnosis not present

## 2023-09-06 DIAGNOSIS — M549 Dorsalgia, unspecified: Secondary | ICD-10-CM | POA: Diagnosis not present

## 2023-09-06 DIAGNOSIS — M961 Postlaminectomy syndrome, not elsewhere classified: Secondary | ICD-10-CM | POA: Diagnosis not present

## 2023-09-06 DIAGNOSIS — M5459 Other low back pain: Secondary | ICD-10-CM | POA: Diagnosis not present

## 2023-09-06 DIAGNOSIS — M5412 Radiculopathy, cervical region: Secondary | ICD-10-CM | POA: Diagnosis not present

## 2023-09-06 DIAGNOSIS — Z79899 Other long term (current) drug therapy: Secondary | ICD-10-CM | POA: Diagnosis not present

## 2023-09-09 ENCOUNTER — Other Ambulatory Visit: Payer: Self-pay | Admitting: Physician Assistant

## 2023-09-12 ENCOUNTER — Other Ambulatory Visit: Payer: Self-pay | Admitting: *Deleted

## 2023-09-12 DIAGNOSIS — M7702 Medial epicondylitis, left elbow: Secondary | ICD-10-CM | POA: Diagnosis not present

## 2023-09-12 MED ORDER — AMLODIPINE BESYLATE 10 MG PO TABS
10.0000 mg | ORAL_TABLET | Freq: Every day | ORAL | 1 refills | Status: DC
Start: 2023-09-12 — End: 2024-03-12

## 2023-09-18 ENCOUNTER — Telehealth: Payer: Self-pay | Admitting: Pharmacy Technician

## 2023-09-18 NOTE — Telephone Encounter (Signed)
 Prior authorization has been extended to 04/17/24 for the nexletol  per insurance.

## 2023-09-19 ENCOUNTER — Other Ambulatory Visit: Payer: Self-pay | Admitting: Physician Assistant

## 2023-09-21 DIAGNOSIS — M5412 Radiculopathy, cervical region: Secondary | ICD-10-CM | POA: Diagnosis not present

## 2023-09-21 DIAGNOSIS — G894 Chronic pain syndrome: Secondary | ICD-10-CM | POA: Diagnosis not present

## 2023-09-30 DIAGNOSIS — M5416 Radiculopathy, lumbar region: Secondary | ICD-10-CM | POA: Diagnosis not present

## 2023-10-02 ENCOUNTER — Other Ambulatory Visit: Payer: Self-pay | Admitting: *Deleted

## 2023-10-02 MED ORDER — IRBESARTAN 300 MG PO TABS: 300.0000 mg | ORAL_TABLET | Freq: Every day | ORAL | 1 refills | Status: DC

## 2023-10-04 DIAGNOSIS — M25511 Pain in right shoulder: Secondary | ICD-10-CM | POA: Diagnosis not present

## 2023-10-04 DIAGNOSIS — M25512 Pain in left shoulder: Secondary | ICD-10-CM | POA: Diagnosis not present

## 2023-10-05 DIAGNOSIS — M5416 Radiculopathy, lumbar region: Secondary | ICD-10-CM | POA: Diagnosis not present

## 2023-10-05 DIAGNOSIS — M961 Postlaminectomy syndrome, not elsewhere classified: Secondary | ICD-10-CM | POA: Diagnosis not present

## 2023-10-05 DIAGNOSIS — G894 Chronic pain syndrome: Secondary | ICD-10-CM | POA: Diagnosis not present

## 2023-10-09 ENCOUNTER — Encounter (INDEPENDENT_AMBULATORY_CARE_PROVIDER_SITE_OTHER): Payer: Self-pay

## 2023-10-09 ENCOUNTER — Other Ambulatory Visit: Payer: Self-pay

## 2023-10-10 ENCOUNTER — Other Ambulatory Visit: Payer: Self-pay

## 2023-10-10 DIAGNOSIS — J3089 Other allergic rhinitis: Secondary | ICD-10-CM | POA: Diagnosis not present

## 2023-10-10 DIAGNOSIS — J4541 Moderate persistent asthma with (acute) exacerbation: Secondary | ICD-10-CM | POA: Diagnosis not present

## 2023-10-10 NOTE — Progress Notes (Signed)
 Specialty Pharmacy Refill Coordination Note  KRISTA GODSIL is a 73 y.o. female contacted today regarding refills of specialty medication(s) Etanercept  (Enbrel  SureClick)   Patient requested Delivery   Delivery date: 10/11/23   Verified address: (Patient-Rptd) [address] [place],  [place] [address]   Medication will be filled on 06.24.25.

## 2023-10-13 ENCOUNTER — Other Ambulatory Visit: Payer: Self-pay | Admitting: *Deleted

## 2023-10-13 MED ORDER — GABAPENTIN 300 MG PO CAPS: 300.0000 mg | ORAL_CAPSULE | Freq: Every day | ORAL | 2 refills | Status: DC

## 2023-10-17 DIAGNOSIS — Z79899 Other long term (current) drug therapy: Secondary | ICD-10-CM | POA: Diagnosis not present

## 2023-10-17 DIAGNOSIS — E7849 Other hyperlipidemia: Secondary | ICD-10-CM | POA: Diagnosis not present

## 2023-10-18 ENCOUNTER — Ambulatory Visit: Payer: Self-pay | Admitting: Nurse Practitioner

## 2023-10-18 ENCOUNTER — Other Ambulatory Visit: Payer: Self-pay | Admitting: Physician Assistant

## 2023-10-18 DIAGNOSIS — M7741 Metatarsalgia, right foot: Secondary | ICD-10-CM | POA: Diagnosis not present

## 2023-10-18 DIAGNOSIS — M84374D Stress fracture, right foot, subsequent encounter for fracture with routine healing: Secondary | ICD-10-CM | POA: Diagnosis not present

## 2023-10-18 DIAGNOSIS — M2041 Other hammer toe(s) (acquired), right foot: Secondary | ICD-10-CM | POA: Diagnosis not present

## 2023-10-18 LAB — NMR, LIPOPROFILE
Cholesterol, Total: 115 mg/dL (ref 100–199)
HDL Particle Number: 44.7 umol/L (ref >=30.5)
HDL-C: 47 mg/dL (ref >39)
LDL Particle Number: 304 nmol/L (ref <1000)
LDL-C (NIH Calc): 40 mg/dL (ref 0–99)
LP-IR Score: 71 — ABNORMAL HIGH (ref <=45)
Small LDL Particle Number: 118 nmol/L (ref <=527)
Triglycerides: 170 mg/dL — ABNORMAL HIGH (ref 0–149)

## 2023-10-18 LAB — COMPREHENSIVE METABOLIC PANEL WITH GFR
ALT: 15 IU/L (ref 0–32)
AST: 19 IU/L (ref 0–40)
Albumin: 4.4 g/dL (ref 3.8–4.8)
Alkaline Phosphatase: 43 IU/L — ABNORMAL LOW (ref 44–121)
BUN/Creatinine Ratio: 19 (ref 12–28)
BUN: 23 mg/dL (ref 8–27)
Bilirubin Total: 0.4 mg/dL (ref 0.0–1.2)
CO2: 23 mmol/L (ref 20–29)
Calcium: 10.1 mg/dL (ref 8.7–10.3)
Chloride: 103 mmol/L (ref 96–106)
Creatinine, Ser: 1.24 mg/dL — ABNORMAL HIGH (ref 0.57–1.00)
Globulin, Total: 1.8 g/dL (ref 1.5–4.5)
Glucose: 98 mg/dL (ref 70–99)
Potassium: 5.2 mmol/L (ref 3.5–5.2)
Sodium: 141 mmol/L (ref 134–144)
Total Protein: 6.2 g/dL (ref 6.0–8.5)
eGFR: 46 mL/min/{1.73_m2} — ABNORMAL LOW (ref >59)

## 2023-10-19 ENCOUNTER — Other Ambulatory Visit: Payer: Self-pay | Admitting: Pharmacist Clinician (PhC)/ Clinical Pharmacy Specialist

## 2023-10-19 MED ORDER — REPATHA SURECLICK 140 MG/ML ~~LOC~~ SOAJ: 140.0000 mg | SUBCUTANEOUS | 3 refills | Status: AC

## 2023-10-26 ENCOUNTER — Other Ambulatory Visit: Payer: Self-pay | Admitting: Physician Assistant

## 2023-10-30 ENCOUNTER — Other Ambulatory Visit: Payer: Self-pay

## 2023-10-30 DIAGNOSIS — R609 Edema, unspecified: Secondary | ICD-10-CM

## 2023-10-30 DIAGNOSIS — K219 Gastro-esophageal reflux disease without esophagitis: Secondary | ICD-10-CM

## 2023-10-30 MED ORDER — FUROSEMIDE 20 MG PO TABS: 20.0000 mg | ORAL_TABLET | Freq: Every day | ORAL | 0 refills | Status: DC

## 2023-10-30 MED ORDER — OMEPRAZOLE 40 MG PO CPDR: 40.0000 mg | DELAYED_RELEASE_CAPSULE | Freq: Every day | ORAL | 3 refills | Status: AC

## 2023-11-03 ENCOUNTER — Other Ambulatory Visit: Payer: Self-pay

## 2023-11-03 ENCOUNTER — Encounter (INDEPENDENT_AMBULATORY_CARE_PROVIDER_SITE_OTHER): Payer: Self-pay

## 2023-11-03 ENCOUNTER — Other Ambulatory Visit: Payer: Self-pay | Admitting: Internal Medicine

## 2023-11-03 DIAGNOSIS — Z79899 Other long term (current) drug therapy: Secondary | ICD-10-CM

## 2023-11-03 DIAGNOSIS — M0600 Rheumatoid arthritis without rheumatoid factor, unspecified site: Secondary | ICD-10-CM

## 2023-11-03 NOTE — Telephone Encounter (Signed)
 Last Fill: 07/25/2023  Labs: 06/05/2023 eGFR 59 Glucose 200  Contacted patient to update labs, stated she would be in next week.    TB Gold: 11/21/2022 TB Gold Negative    Next Visit: 11/15/2023  Last Visit: 08/14/2023  DX: seronegative RA   Current Dose per office note 08/14/2023: Enbrel  50 mg weekly   Okay to refill Enbrel ?

## 2023-11-06 ENCOUNTER — Other Ambulatory Visit: Payer: Self-pay

## 2023-11-06 ENCOUNTER — Other Ambulatory Visit: Payer: Self-pay | Admitting: Internal Medicine

## 2023-11-06 ENCOUNTER — Other Ambulatory Visit (HOSPITAL_COMMUNITY): Payer: Self-pay

## 2023-11-06 DIAGNOSIS — Z79899 Other long term (current) drug therapy: Secondary | ICD-10-CM

## 2023-11-06 DIAGNOSIS — M0600 Rheumatoid arthritis without rheumatoid factor, unspecified site: Secondary | ICD-10-CM

## 2023-11-06 MED ORDER — ENBREL SURECLICK 50 MG/ML ~~LOC~~ SOAJ
50.0000 mg | SUBCUTANEOUS | 0 refills | Status: DC
  Filled 2023-11-06: qty 4, 28d supply, fill #0

## 2023-11-06 NOTE — Progress Notes (Signed)
 Specialty Pharmacy Refill Coordination Note  MyChart Questionnaire Submission  Vanessa Harmon is a 73 y.o. female contacted today regarding refills of specialty medication(s) Enbrel .  Doses on hand: 1 for 11/06/23   Injection date: 11/13/23  Patient requested: Delivery   Delivery date: 11/09/23   Verified address: [address] [place] [address]  Medication will be filled on 11/08/23.  This fill date is pending response to refill request from provider. Patient is aware and if they have not received fill by intended date, they must follow up with pharmacy.

## 2023-11-06 NOTE — Telephone Encounter (Signed)
 Last Fill: 07/25/2023  Labs: 10/17/2023 CMP Creatinine 1.24 eGFR 46 Alkaline Phosphatase 43  06/05/2023 CBC RBC 3.88 MCV 99.3 MCH 32.9 Absolute Lymphocytes 0.7  TB Gold: 11/21/2022  Negative  Next Visit: 11/15/2023  Last Visit: 08/14/2023  IK:Dzmnwzhjupcz rheumatoid arthritis (HCC)   Current Dose per office note 08/14/2023: Enbrel  50 mg subcu weekly   Patient to update labs at upcomming appointment.   Okay to refill Enbrel ?

## 2023-11-07 ENCOUNTER — Other Ambulatory Visit (HOSPITAL_COMMUNITY): Payer: Self-pay

## 2023-11-07 ENCOUNTER — Other Ambulatory Visit: Payer: Self-pay

## 2023-11-07 ENCOUNTER — Encounter: Payer: Self-pay | Admitting: Physician Assistant

## 2023-11-07 ENCOUNTER — Ambulatory Visit (INDEPENDENT_AMBULATORY_CARE_PROVIDER_SITE_OTHER): Payer: Medicare Other | Admitting: Physician Assistant

## 2023-11-07 VITALS — BP 138/76 | HR 63 | Temp 97.9°F | Ht 66.0 in | Wt 178.4 lb

## 2023-11-07 DIAGNOSIS — M0600 Rheumatoid arthritis without rheumatoid factor, unspecified site: Secondary | ICD-10-CM | POA: Diagnosis not present

## 2023-11-07 DIAGNOSIS — N189 Chronic kidney disease, unspecified: Secondary | ICD-10-CM

## 2023-11-07 DIAGNOSIS — I10 Essential (primary) hypertension: Secondary | ICD-10-CM | POA: Diagnosis not present

## 2023-11-07 DIAGNOSIS — J4540 Moderate persistent asthma, uncomplicated: Secondary | ICD-10-CM | POA: Diagnosis not present

## 2023-11-07 HISTORY — DX: Chronic kidney disease, unspecified: N18.9

## 2023-11-07 LAB — CBC WITH DIFFERENTIAL/PLATELET
Basophils Absolute: 0 K/uL (ref 0.0–0.1)
Basophils Relative: 0.4 % (ref 0.0–3.0)
Eosinophils Absolute: 0.1 K/uL (ref 0.0–0.7)
Eosinophils Relative: 0.9 % (ref 0.0–5.0)
HCT: 35 % — ABNORMAL LOW (ref 36.0–46.0)
Hemoglobin: 11.5 g/dL — ABNORMAL LOW (ref 12.0–15.0)
Lymphocytes Relative: 11.6 % — ABNORMAL LOW (ref 12.0–46.0)
Lymphs Abs: 0.8 K/uL (ref 0.7–4.0)
MCHC: 32.8 g/dL (ref 30.0–36.0)
MCV: 95 fl (ref 78.0–100.0)
Monocytes Absolute: 0.6 K/uL (ref 0.1–1.0)
Monocytes Relative: 8.2 % (ref 3.0–12.0)
Neutro Abs: 5.6 K/uL (ref 1.4–7.7)
Neutrophils Relative %: 78.9 % — ABNORMAL HIGH (ref 43.0–77.0)
Platelets: 277 K/uL (ref 150.0–400.0)
RBC: 3.68 Mil/uL — ABNORMAL LOW (ref 3.87–5.11)
RDW: 14.9 % (ref 11.5–15.5)
WBC: 7.1 K/uL (ref 4.0–10.5)

## 2023-11-07 LAB — COMPREHENSIVE METABOLIC PANEL WITH GFR
ALT: 16 U/L (ref 0–35)
AST: 16 U/L (ref 0–37)
Albumin: 4.3 g/dL (ref 3.5–5.2)
Alkaline Phosphatase: 30 U/L — ABNORMAL LOW (ref 39–117)
BUN: 21 mg/dL (ref 6–23)
CO2: 29 meq/L (ref 19–32)
Calcium: 9.6 mg/dL (ref 8.4–10.5)
Chloride: 102 meq/L (ref 96–112)
Creatinine, Ser: 1.14 mg/dL (ref 0.40–1.20)
GFR: 47.43 mL/min — ABNORMAL LOW (ref >60.00)
Glucose, Bld: 221 mg/dL — ABNORMAL HIGH (ref 70–99)
Potassium: 4.8 meq/L (ref 3.5–5.1)
Sodium: 138 meq/L (ref 135–145)
Total Bilirubin: 0.3 mg/dL (ref 0.2–1.2)
Total Protein: 6.4 g/dL (ref 6.0–8.3)

## 2023-11-07 NOTE — Progress Notes (Signed)
 Vanessa Harmon is a 73 y.o. female here for a follow up of a pre-existing problem.  History of Present Illness:   Chief Complaint  Patient presents with   Medical Management of Chronic Issues    Hypertension    HPI  HTN Pt is on Amlodipine  10 mg daily, Irbesartan  300 mg daily, and Lasix  20 mg daily. Good compliance and tolerance. HTN is well-managed. Endorses walking. No concerns reported today.  Allergies / Asthma Pt is on Dulera 100-5 mct/act. She reports her voice has been hoarse recently. She also report occasional tightness and coughing, so she feels like she may need to use her rescue inhaler soon. She states she's always struggled with allergies and is awaiting allergy test results. Of note, pt lives with dogs despite being allergic, but has done injections to live with them.  Seronegative rheumatoid arthritis Continues to see Dr Lonni Ester Continues on gabapentin  300 mg nightly, enbrel  50 mg, prednisone  5 mg daily and indomethacin  75 mg daily  Past Medical History:  Diagnosis Date   Adenomatous polyp    Allergy    Anxiety    takes Ativan  daily as needed   Arthritis    inflammatory arthritis (Dr. RONAL Ludie Piety)   Asthma    Cataract    Chronic back pain    scoliosis    Constipation    takes miralax  every other day   COVID 05/19/2022   Depression    takes Lexapro  daily   Eczema    Essential hypertension, benign    takes Hyzaar and Metoprolol  daily   Family history of adverse reaction to anesthesia    Sister PONV   Fibromyalgia    GERD (gastroesophageal reflux disease)    History of colon polyps    History of shingles    Hyperlipidemia    takes Atorvastatin  daily   Insomnia    takes Melatonin nightly   Joint pain    Osteoarthritis    Osteoporosis    PSVT (paroxysmal supraventricular tachycardia) (HCC)    Recurrent upper respiratory infection (URI)    Rheumatoid arthritis (HCC)    sees Dr. Mai; stable on Orencia    Rhinitis,  allergic    uses Flonase  daily   Urinary urgency    Urticaria      Social History   Tobacco Use   Smoking status: Former    Current packs/day: 0.00    Average packs/day: 1 pack/day for 20.0 years (20.0 ttl pk-yrs)    Types: Cigarettes    Start date: 5    Quit date: 1998    Years since quitting: 27.5    Passive exposure: Never   Smokeless tobacco: Never  Vaping Use   Vaping status: Never Used  Substance Use Topics   Alcohol  use: Yes    Comment: rarely with dinner   Drug use: No    Past Surgical History:  Procedure Laterality Date   ADENOIDECTOMY     APPENDECTOMY     BACK SURGERY  2012   at age 77 d/t scoliosis   BLEPHAROPLASTY  2018   BREAST BIOPSY     cataract surgery     COLONOSCOPY     HARDWARE REMOVAL Left 02/24/2020   Procedure: HARDWARE REMOVAL LEFT DISTAL RADIUS;  Surgeon: Murrell Drivers, MD;  Location: [facility];  Service: Orthopedics;  Laterality: Left;   INCISION AND DRAINAGE OF WOUND Left 08/16/2023   Procedure: IRRIGATION AND DEBRIDEMENT WOUND;  Surgeon: Romona Harari, MD;  Location: [facility];  Service: Orthopedics;  Laterality: Left;   KNEE ARTHROSCOPY Bilateral    neuroma removed from foot     unsure of which foot   OPEN REDUCTION INTERNAL FIXATION (ORIF) DISTAL RADIAL FRACTURE Left 08/15/2019   Procedure: OPEN REDUCTION INTERNAL FIXATION (ORIF) DISTAL RADIAL FRACTURE;  Surgeon: Murrell Drivers, MD;  Location: [facility];  Service: Orthopedics;  Laterality: Left;  block in preop   POSTERIOR CERVICAL FUSION/FORAMINOTOMY N/A 05/29/2020   Procedure: Removal of hardware, Replacement of thoracic 8, thoracic 9, thoracic 10 screws, Right Thoracic 6-7, Right Thoracic 7-8 Laminectomies with thoracic hooks from Thoracic 4-5 to Thoracic 10;  Surgeon: Unice Pac, MD;  Location: [facility] OR;  Service: Neurosurgery;  Laterality: N/A;   POSTERIOR LUMBAR FUSION 4 LEVEL N/A 02/15/2018   Procedure: Decompression and fusion  Thoracic nine to Lumbar two with exploration of previous fusion;  Surgeon: Unice Pac, MD;  Location: [facility] OR;  Service: Neurosurgery;  Laterality: N/A;   SHOULDER SURGERY Right    TONSILLECTOMY     TOTAL HIP ARTHROPLASTY Right 04/15/2014   Procedure: RIGHT TOTAL HIP ARTHROPLASTY ANTERIOR APPROACH;  Surgeon: Evalene JONETTA Chancy, MD;  Location: [facility] OR;  Service: Orthopedics;  Laterality: Right;   TOTAL KNEE ARTHROPLASTY Left 11/26/2019   Procedure: TOTAL KNEE ARTHROPLASTY;  Surgeon: Ernie Cough, MD;  Location: [facility];  Service: Orthopedics;  Laterality: Left;  70 mins   VITRECTOMY Left 06/28/2023   VITRECTOMY Right 08/09/2023   WOUND EXPLORATION Left 07/15/2023   Procedure: WOUND EXPLORATION;  Surgeon: Romona Harari, MD;  Location: [facility] OR;  Service: Orthopedics;  Laterality: Left;  ARM    Family History  Problem Relation Age of Onset   Atrial fibrillation Mother    Stroke Mother    Allergic rhinitis Mother    Allergic rhinitis Father    AAA (abdominal aortic aneurysm) Father    COPD Father    Prostate cancer Father    Atrial fibrillation Brother        ablation   Stomach cancer Paternal Grandmother        some sort of abdominal cancer   Hypertension Other        unspecified grandmother   AAA (abdominal aortic aneurysm) Other        unspecified grandfather   Cancer Other        unspecified grandfather   Neuropathy Neg Hx    Colon cancer Neg Hx    Esophageal cancer Neg Hx    Rectal cancer Neg Hx     Allergies  Allergen Reactions   Advair Hfa [Fluticasone -Salmeterol] Other (See Comments)    Hoarness   Statins Other (See Comments)   Breo Ellipta  [Fluticasone  Furoate-Vilanterol] Other (See Comments)    Severe hoarseness    Fosamax [Alendronate Sodium] Other (See Comments)    myalgias   Levaquin  [Levofloxacin ] Other (See Comments)    Lightheadedness, not feeling well, ear pain   Infliximab Other (See Comments)    Skin reaction   Spiriva  Handihaler [Tiotropium Bromide   Monohydrate]     UNSPECIFIED REACTION    Verapamil    Tape Rash    PAPER TAPE: Causes severe rash and weeping    Current Medications:   Current Outpatient Medications:    albuterol  (PROAIR  HFA) 108 (90 Base) MCG/ACT inhaler, Inhale two puffs every 4-6 hours if needed for cough or wheeze., Disp: 8.5 Inhaler, Rfl: 1   amLODipine  (NORVASC ) 10 MG tablet, Take 1 tablet (10 mg total) by mouth daily., Disp: 90 tablet, Rfl:  1   aspirin  EC 81 MG tablet, Take 1 tablet (81 mg total) by mouth daily. Swallow whole., Disp: 30 tablet, Rfl: 11   azelastine  (ASTELIN ) 0.1 % nasal spray, Place 1 spray into both nostrils 2 (two) times daily. Use in each nostril as directed, Disp: , Rfl:    azithromycin (ZITHROMAX) 250 MG tablet, Take by mouth. Takes 250 mg three times a week, Disp: , Rfl:    Bempedoic Acid  (NEXLETOL ) 180 MG TABS, Take 1 tablet (180 mg total) by mouth daily., Disp: 90 tablet, Rfl: 3   budesonide  (PULMICORT ) 0.5 MG/2ML nebulizer solution, Take by nebulization., Disp: , Rfl:    Calcium  Carb-Cholecalciferol  (CALCIUM  600/VITAMIN D3 PO), Take 1 tablet by mouth daily., Disp: , Rfl:    Carboxymethylcellulose Sod PF (REFRESH PLUS) 0.5 % SOLN, as needed., Disp: , Rfl:    Cetirizine HCl (ZYRTEC ALLERGY) 10 MG CAPS, , Disp: , Rfl:    denosumab  (PROLIA ) 60 MG/ML SOSY injection, Inject 60 mg into the skin every 6 (six) months. Courier to rheum: [address], [place] [address]. Appt on 05/23/2023, Disp: 1 mL, Rfl: 0   diphenhydrAMINE  (BENADRYL ) 25 MG tablet, Take 50 mg by mouth daily as needed for allergies., Disp: , Rfl:    docusate sodium  (COLACE) 100 MG capsule, 100 mg 2 (two) times daily., Disp: , Rfl:    escitalopram  (LEXAPRO ) 20 MG tablet, Take 1 tablet (20 mg total) by mouth daily., Disp: 90 tablet, Rfl: 1   etanercept  (ENBREL  SURECLICK) 50 MG/ML injection, Inject 50 mg into the skin once a week., Disp: 4 mL, Rfl: 0   Evolocumab  (REPATHA  SURECLICK) 140 MG/ML SOAJ, Inject 140 mg into the  skin every 14 (fourteen) days., Disp: 6 mL, Rfl: 3   ezetimibe  (ZETIA ) 10 MG tablet, TAKE ONE TABLET BY MOUTH DAILY, Disp: 90 tablet, Rfl: 0   famotidine  (PEPCID ) 40 MG tablet, TAKE 1 TABLET (40 MG TOTAL) BY MOUTH AT BEDTIME., Disp: 30 tablet, Rfl: 5   furosemide  (LASIX ) 20 MG tablet, Take 1 tablet (20 mg total) by mouth daily., Disp: 90 tablet, Rfl: 0   gabapentin  (NEURONTIN ) 300 MG capsule, Take 1 capsule (300 mg total) by mouth at bedtime., Disp: 90 capsule, Rfl: 2   guaiFENesin  (MUCINEX ) 600 MG 12 hr tablet, Take 600 mg by mouth 2 (two) times daily as needed for cough or to loosen phlegm., Disp: , Rfl:    HYDROcodone -acetaminophen  (NORCO) 10-325 MG tablet, 1 tablet every 6 (six) hours as needed., Disp: , Rfl:    indomethacin  (INDOCIN  SR) 75 MG CR capsule, TAKE ONE CAPSULE BY MOUTH DAILY WITH BREAKFAST, Disp: 90 capsule, Rfl: 0   ipratropium (ATROVENT ) 0.03 % nasal spray, Place 1 spray into both nostrils 2 (two) times daily as needed for rhinitis., Disp: , Rfl:    irbesartan  (AVAPRO ) 300 MG tablet, Take 1 tablet (300 mg total) by mouth daily., Disp: 90 tablet, Rfl: 1   Lifitegrast  (XIIDRA ) 5 % SOLN, Place 1 drop into both eyes in the morning and at bedtime. , Disp: , Rfl:    melatonin 3 MG TABS tablet, Take by mouth., Disp: , Rfl:    methocarbamol  (ROBAXIN ) 500 MG tablet, Take 1 tablet (500 mg total) by mouth every 6 (six) hours as needed for muscle spasms., Disp: 60 tablet, Rfl: 2   mometasone -formoterol  (DULERA) 100-5 MCG/ACT AERO, Inhale 2 puffs into the lungs 2 (two) times daily., Disp: , Rfl:    montelukast  (SINGULAIR ) 10 MG tablet, Take 1 tablet (  10 mg total) by mouth at bedtime., Disp: 30 tablet, Rfl: 5   Multiple Vitamin (MULTIVITAMIN) capsule, Take 1 capsule by mouth daily., Disp: , Rfl:    omeprazole  (PRILOSEC) 40 MG capsule, Take 1 capsule (40 mg total) by mouth daily. TAKE ONE CAPSULE BY MOUTH DAILY BEFORE BREAKFAST DAILY, Disp: 90 capsule, Rfl: 3   OVER THE COUNTER MEDICATION, Place  1 application into both eyes at bedtime. Genteal overnight eye ointment, Disp: , Rfl:    polyethylene glycol powder (GLYCOLAX /MIRALAX ) 17 GM/SCOOP powder, Miralax  17 gram/dose oral powder, Disp: , Rfl:    Povidone (IVIZIA DRY EYES OP), Apply to eye., Disp: , Rfl:    predniSONE  (DELTASONE ) 5 MG tablet, TAKE 1 TABLET (5 MG TOTAL) BY MOUTH DAILY WITH BREAKFAST., Disp: 90 tablet, Rfl: 0   PRESCRIPTION MEDICATION, Place 1 spray into the nose in the morning and at bedtime. Budesonide  0.5 mg / 2 mL in the morning Nasal rinse, 1.0mg /42mL in the evening., Disp: , Rfl:    pseudoephedrine  (SUDAFED) 30 MG tablet, Take 30 mg by mouth every 4 (four) hours as needed for congestion., Disp: , Rfl:    traZODone  (DESYREL ) 150 MG tablet, TAKE ONE TABLET BY MOUTH DAILY, Disp: 90 tablet, Rfl: 0   valACYclovir  (VALTREX ) 1000 MG tablet, Take 1,000 mg by mouth daily. , Disp: , Rfl:    vitamin C  (ASCORBIC ACID ) 500 MG tablet, Take 500 mg by mouth daily., Disp: , Rfl:    Review of Systems:   Negative unless otherwise specified per HPI.  Vitals:   Vitals:   11/07/23 1310  BP: 138/76  Pulse: 63  Temp: 97.9 F (36.6 C)  TempSrc: Temporal  SpO2: 99%  Weight: 178 lb 6.1 oz (80.9 kg)  Height: 5' 6 (1.676 m)     Body mass index is 28.79 kg/m.  Physical Exam:   Physical Exam Vitals and nursing note reviewed.  Constitutional:      General: She is not in acute distress.    Appearance: She is well-developed. She is not ill-appearing or toxic-appearing.  Cardiovascular:     Rate and Rhythm: Normal rate and regular rhythm.     Pulses: Normal pulses.     Heart sounds: Normal heart sounds, S1 normal and S2 normal.  Pulmonary:     Effort: Pulmonary effort is normal.     Breath sounds: Normal breath sounds.  Skin:    General: Skin is warm and dry.  Neurological:     Mental Status: She is alert.     GCS: GCS eye subscore is 4. GCS verbal subscore is 5. GCS motor subscore is 6.  Psychiatric:        Speech:  Speech normal.        Behavior: Behavior normal. Behavior is cooperative.     Assessment and Plan:   Essential (primary) hypertension (Primary) - CBC with Differential/Platelet - Comprehensive metabolic panel with GFR  Normotensive Continue Amlodipine  10 mg daily, Irbesartan  300 mg daily, and Lasix  20 mg daily. Follow up in 6 month(s), sooner if concerns  Seronegative rheumatoid arthritis (HCC) Stable Management per rheumatology Continue gabapentin  300 mg nightly, enbrel  50 mg, prednisone  5 mg daily and indomethacin  75 mg daily Will order CBC today and fwd to her rheumatologist  Moderate persistent asthma without complication Uncontrolled Ongoing management with allergy Continues on dulera and as needed albuterol   I, Lavern Simmers, acting as a Neurosurgeon for [employer], [place]., have documented all relevant documentation on the behalf of [place], [place],  as directed by Lucie Buttner, PA while in the presence of Lucie Buttner, [place].  I, [place], [place], have reviewed all documentation for this visit. The documentation on 11/07/23 for the exam, diagnosis, procedures, and orders are all accurate and complete.  Lucie Buttner, PA-C

## 2023-11-08 ENCOUNTER — Ambulatory Visit: Payer: Self-pay | Admitting: Physician Assistant

## 2023-11-08 ENCOUNTER — Other Ambulatory Visit: Payer: Self-pay

## 2023-11-08 ENCOUNTER — Other Ambulatory Visit (HOSPITAL_COMMUNITY): Payer: Self-pay

## 2023-11-08 DIAGNOSIS — N1832 Chronic kidney disease, stage 3b: Secondary | ICD-10-CM | POA: Insufficient documentation

## 2023-11-08 DIAGNOSIS — N1831 Chronic kidney disease, stage 3a: Secondary | ICD-10-CM | POA: Insufficient documentation

## 2023-11-13 DIAGNOSIS — H401132 Primary open-angle glaucoma, bilateral, moderate stage: Secondary | ICD-10-CM | POA: Diagnosis not present

## 2023-11-13 DIAGNOSIS — H43313 Vitreous membranes and strands, bilateral: Secondary | ICD-10-CM | POA: Diagnosis not present

## 2023-11-13 DIAGNOSIS — H43393 Other vitreous opacities, bilateral: Secondary | ICD-10-CM | POA: Diagnosis not present

## 2023-11-13 DIAGNOSIS — H33311 Horseshoe tear of retina without detachment, right eye: Secondary | ICD-10-CM | POA: Diagnosis not present

## 2023-11-13 NOTE — Progress Notes (Signed)
 Office Visit Note  Patient: Vanessa Harmon             Date of Birth: 12/23/1950           MRN: [id number]             PCP: Job Lukes, PA Referring: Job Lukes, PA Visit Date: 11/15/2023   Subjective:  Follow-up (Labs from last week, and her left elbow )   Discussed the use of AI scribe software for clinical note transcription with the patient, who gave verbal consent to proceed.  History of Present Illness   Vanessa Harmon is a 73 y.o. female here for follow up for seronegative RA on Enbrel  50 mg weekly, prednisone  5 mg daily, and indomethacin  75 mg daily.    She has a history of chronic kidney disease, with recent glomerular filtration rate (GFR) readings of 47. Previous GFR readings were 50 six months ago, 47 a year ago, and 45 earlier last year.  She is on diclofenac, a nonsteroidal anti-inflammatory drug (NSAID), for arthritis, which can potentially affect kidney function. She is not on maximal doses of NSAIDs. She is also on azithromycin three times a week as an anti-inflammatory. No recent infections or need for antibiotics.  She has ongoing issues with her foot, suspecting another stress fracture and experiencing significant pain in her toes. She mentions a sagging joint and a previous discussion about a potential CT scan and joint fusion, which she is hesitant to pursue due to past experiences with multiple back fusions.  She reports persistent pain in her elbow, consistent with previous episodes of epicondylitis. The pain is described as 'the same old, same old,' with soreness primarily on the medial side. She has received injections for this in the past, with the last one administered in April with Dr. Romona.  Recent lab work, including blood count and metabolic panel, was reviewed. Her hemoglobin is slightly below the normal range at 11.5, but this is not a new finding. Her absolute neutrophil count is normal, and there are no concerning changes in her  blood work.      Previous HPI 08/14/2023 CARSYN Harmon is a 73 y.o. female here for follow up for seronegative RA on Enbrel  50 mg weekly, prednisone  5 mg daily, and indomethacin  75 mg daily.    She is scheduled for a repeat surgery on Wednesday to address a dog bite wound on her left forearm that requires debridement. The initial injury involved significant bleeding due to a vessel tear, necessitating surgical intervention. She notes that the skin on her arm is thin, which may complicate suturing.   She is currently on Enbrel  for arthritis, and there is consideration about holding the medication before and after the procedure due to concerns about wound healing. She is also taking indomethacin  for pain management but plans to skip it on the day of the surgery to avoid increased bleeding risk.   Her arthritis is generally well-managed, although she experiences stiffness in her hands, attributed to osteoarthritis. No recent illnesses or infections are reported.   Also had latest prolia  injection 05/23/23 for osteoporosis management. This was uneventful. No new falls.   Previous HPI 05/16/2023 Vanessa Harmon is a 73 y.o. female here for follow up for seronegative RA on Enbrel  50 mg weekly, prednisone  5 mg daily, and indomethacin  75 mg daily.   They have been managing their rheumatoid arthritis with Enbrel , which they have been able to obtain on time without any gaps  in administration. Over the past month, they have noticed significant improvement in their symptoms, with no swelling in their hands and resolution of pain in their knuckles. Previously, they had an injection for their elbow, but since starting Enbrel , their elbow has not been problematic. They also had their shoulders injected a couple of weeks ago, which has been helpful in managing their symptoms.   They are currently taking 5 mg of prednisone  and indomethacin  once a day. Since switching to a citrate-free formulation of Enbrel ,  they have not experienced any more rashes or injection site irritation.   They had a cold in early January, which resolved without the need for antibiotics. The last time they experienced cold or upper respiratory symptoms was in June of the previous year. No recent upper respiratory infections aside from the cold in January.      Previous HPI 02/15/2023 Vanessa Harmon is a 73 y.o. female here for follow up for seronegative RA on prednisone  5 mg daily and indomethacin  75 mg daily and started Enbrel  in August.  So far she is not certain about the amount of difference with addition of the Enbrel .  Is not experiencing any injection related reactions.  She has had some additional viral infections that she is not sure if they are related to medicine or incidental.  She had bilateral shoulder steroid injections with Dr. Melvenia pain is getting worse again in these areas.  Also feels increase in left elbow pain that is similar to the episode of epicondylitis which we saw in March. She had called in about perioperative management of Enbrel  for surgery for toe joint instability.  This was delayed because her husband had a dog hand bite with skin tearing around the same time. Also had mildly low hemoglobin on routine labs with her primary care PA.  Subsequent fecal occult stool card was positive for blood and so was referred to gastroenterology.  Has not had colonoscopy in years when she did there were no concerning findings.  She has a history of previous internal hemorrhoids.  She is still on the indomethacin .   Previous HPI 11/15/2022 [name] is a 73 y.o. female here for follow up for seronegative RA on prednisone  5 mg daily and indomethacin  75 mg daily.  She stopped taking Rinvoq  since about 1 month ago as she felt this was worsening her frequency of upper respiratory infection and needing inhaled bronchodilator and breathing treatments.  Was also having increased symptoms during this time due to  seasonal allergies.  Since she stopped the Rinvoq  feels this is improved although today is a bad day with persistent coughing.  Noticed small increase in joint pain of several areas including her shoulders and hips and knee.  She had repeat corticosteroid injection in both shoulders which has been improving her symptoms range of motion is better still pain with use.  Persistent bruising and skin rashes with a new avulsion on the left forearm but about the same as usual.   Labs reviewed 10/2022 WBC 11.9 Hgb 11.8 eGFR 47.27   DMARD Hx Methotrexate  - LFT elevations Remicade- Rashes Actemra  - Rashes Orencia  IV - Secondary nonresponder Rinvoq  - Respiratory symptoms worse     Previous HPI 09/21/2022 KYTZIA GIENGER is a 73 y.o. female here for follow up here for follow-up of seronegative RA on Rinvoq  15 mg p.o. daily and prednisone  5 mg daily.  The left elbow pain discussed at her last visit improved after steroid injection and has continued  to do well.  Joint pain and inflammation doing pretty well overall chronic back pain is her biggest issue.  Continues having easy bruising and skin tears frequently from her 90 pound Rottweiler.  Recently sick with sinus infection for the past week is currently on amoxicillin  for this.  Not associated with any fevers lymphadenopathy or other constitutional symptoms.   Previous HPI 07/13/22 ELIANA LUETH is a 73 y.o. female here for follow up for seronegative RA on rinvoq  15 mg PO daily and prednisone  5 mg daily.  Since her last visit she finished getting over the persistent sinus infection symptoms after recent COVID illness.  However once the symptoms cleared up she started noticing increase in hip bursitis pain.  Also started getting worsening pain along the medial side of the left elbow.  Most noticeable when using her hands to help her self push off from a seated position due to her hip pain.  Not noticed any visible swelling or erythema she still has  bruising around the elbow but that is more chronic and there are multiple skin tears in the area as well.  Does not feel like she is having flareup of increased joint symptoms outside of the hip and elbow pains.  She previously tried use of the elbow brace which we discussed before for her medial epicondylitis but just gets irritation around the skin and elbow does not feel much benefit.   Previous HPI 06/16/22 TEMECA SOMMA is a 73 y.o. female here for follow up for seronegative RA on prednisone  5 mg daily and rinvoq  15 mg daily started since 2/18. Also prolia  for osteoporosis last injection on 1/24 without complication. Main issue has been sickness with COVID positive test 2/8 and persistent symptoms. She had subsequent sinus infection treated with augmentin  starting 2/21 and had repeat labs checked yesterday. Brain fog, fatigue worse during COVID infection and partially improved since. She thinks rinvoq  is starting to help with swelling and pain in hands and wrists but still significant overall symptoms. Has some skin tears and bruising on arms from her dog.   Previous HPI 03/16/22 CONYA ELLINWOOD is a 73 y.o. female here for follow up for seropositive RA on prednisone  5 mg daily. Methotrexate  was discontinued due to worsening laboratory function tests and Actemra  stopped due to developing palpable rashes throughout her legs similar to previous medication reaction to infliximab. She is also noticed easier bruising than usual and has seen discoloration of her nails with some separation and cracking.  Prednisone  is helping somewhat but still has ongoing joint pain and stiffness specially off of her previous medications.  In particular bothering her lately has been persistent pain along the medial side of her left elbow does not recall any specific injury to this area but does feel like there is a sense of swelling and pressure.   Previous HPI 12/14/21 EARLINE STINER is a 73 y.o. female here for  follow up for seropositive RA on methotrexate  15 mg subcu weekly prednisone  5 mg daily leucovorin  1 mg daily and after starting Actemra  162 mg Vevay q. 14 days.  She feels like arthritis symptoms have been well controlled on her current medications.  No significant flareups.  She still gets swelling in her wrist but no other visible joint swelling.  She has had pretty extensive bruising and easy skin tearing on both arms often from playing with her dog.  Lab tests checked in July did show appears to be a small decrease in estimated  GFR.   Previous HPI 09/10/2021 MAKAILEE NUDELMAN is a 73 y.o. female here for evaluation of chronic back pain associated with seropositive RA she has been seeing Rosaline Salt on treatment with orencia  IV, MTX 15 mg [place] weekly, leucovorin  1 mg daily, and prednisone  5 mg daily.  She was originally diagnosed with inflammatory arthritis by Dr. Lenon about a decade ago.  Apparently the worst initial symptom at that time was left hip pain and inflammation with concerning findings on MRI.  Was started on methotrexate  as a steroid sparing DMARD treatment.  Apparently had initial concern for seronegative rheumatoid arthritis versus PMR presentation.  She did not tolerate methotrexate  well initially worst side effect was substantial hair loss.  Also did not tolerate well on leflunomide.  She was started on infliximab treatment developed a significant adverse reaction with new skin rashes in multiple areas and severe finger and toe nail changes that lasted about a year after discontinuing the medication.  She was started on IV Orencia  monthly infusions which has been beneficial for symptoms but still notices some amount ongoing and that symptoms tend to worsen between each dosing interval.  She was restarted on methotrexate  this time with leucovorin  in the past few months and this has improved her joint inflammation further.  She remains on low-dose prednisone  5 mg daily throughout most of this  time.  Most recent definite flareup of her rheumatoid arthritis was about 6 months ago. In addition to inflammatory joint disease she has significant amount of generalized osteoarthritis.  She reports bone-on-bone degenerative changes with slightly impaired range of motion in the left shoulder.  She had previous left knee total arthroplasty.  She has degenerative arthritis at multiple levels in the spine with history of repeat lumbar spine surgery.  She has some spinal and foraminal stenosis with radicular symptoms.  She is also had recurrent problems with hip bursitis has been responsive to local steroid injection treatments with Dr. Ernie.  She takes gabapentin  300 mg at night and as needed Robaxin  500 mg for the ongoing symptoms. She has not had any history with recurrent infections or major side effects on her current regimen.  She does have extensive bruising mostly worst in the past year and worst on extensor surfaces of arms and legs.  She denies any history of abnormal bleeding and blood clots. She has chronic dry eyes and sees Dr. Octavia currently uses xiidra  and genteal drops for this.   Review of Systems  Constitutional:  Positive for fatigue.  HENT:  Negative for mouth sores and mouth dryness.   Eyes:  Positive for dryness.  Respiratory:  Negative for shortness of breath.   Cardiovascular:  Negative for chest pain and palpitations.  Gastrointestinal:  Positive for constipation. Negative for blood in stool and diarrhea.  Endocrine: Negative for increased urination.  Genitourinary:  Negative for involuntary urination.  Musculoskeletal:  Positive for joint pain, joint pain, joint swelling and muscle weakness. Negative for gait problem, myalgias, morning stiffness, muscle tenderness and myalgias.  Skin:  Positive for color change. Negative for rash, hair loss and sensitivity to sunlight.  Allergic/Immunologic: Positive for susceptible to infections.  Neurological:  Negative for dizziness and  headaches.  Hematological:  Negative for swollen glands.  Psychiatric/Behavioral:  Negative for depressed mood and sleep disturbance. The patient is not nervous/anxious.     PMFS History:  Patient Active Problem List   Diagnosis Date Noted   Stage 3b chronic kidney disease (HCC) 11/08/2023   Medial epicondylitis  of left elbow 03/16/2022   High risk medication use 09/10/2021   Posterior vitreous detachment of left eye 09/29/2020   Thoracic spondylosis with cord compression 05/29/2020   Heterotopic ossification 03/30/2020   Loosening of hardware in spine (HCC) 01/27/2020   Posterior vitreous detachment of right eye 01/21/2020   Pseudophakia 01/21/2020   Horseshoe retinal tear of right eye 01/21/2020   Body mass index (BMI) 28.0-28.9, adult 11/27/2019   Left knee OA 11/26/2019   Numbness of foot 02/07/2019   Seronegative rheumatoid arthritis (HCC) 01/25/2019   Peripheral polyneuropathy 01/14/2019   Spinal stenosis, thoracolumbar region 01/14/2019   Thoracogenic scoliosis, thoracolumbar region 01/14/2019   Idiopathic scoliosis of thoracolumbar region 02/15/2018   Rhinitis, chronic 05/16/2017   Moderate persistent asthma without complication 05/04/2017   History of polymyalgia rheumatica 02/15/2015   DJD (degenerative joint disease) 04/15/2014   Lumbar spine scoliosis 11/28/2013   Degenerative spondylolisthesis 10/09/2013   Lumbar radiculopathy 09/16/2013   Lumbosacral radiculitis 07/31/2013   Scoliosis, or kyphoscoliosis, idiopathic 07/31/2013   Lumbar back pain 12/19/2011   Hyperlipidemia 10/16/2010   Depression 10/16/2010   Lumbar spondylosis 10/16/2010   Gastroesophageal reflux disease 10/16/2010   Adenomatous polyp of colon 10/16/2010   History of paroxysmal supraventricular tachycardia 10/16/2010   Essential (primary) hypertension 10/16/2010    Past Medical History:  Diagnosis Date   Adenomatous polyp    Allergy    Anxiety    takes Ativan  daily as needed   Arthritis     inflammatory arthritis (Dr. RONAL Ludie Piety)   Asthma    Cataract    Chronic back pain    scoliosis    Chronic kidney disease 11/07/2023   Constipation    takes miralax  every other day   COVID 05/19/2022   Depression    takes Lexapro  daily   Eczema    Essential hypertension, benign    takes Hyzaar and Metoprolol  daily   Family history of adverse reaction to anesthesia    Sister PONV   Fibromyalgia    GERD (gastroesophageal reflux disease)    History of colon polyps    History of shingles    Hyperlipidemia    takes Atorvastatin  daily   Insomnia    takes Melatonin nightly   Joint pain    Osteoarthritis    Osteoporosis    PSVT (paroxysmal supraventricular tachycardia) (HCC)    Recurrent upper respiratory infection (URI)    Rheumatoid arthritis (HCC)    sees Dr. Mai; stable on Orencia    Rhinitis, allergic    uses Flonase  daily   Urinary urgency    Urticaria     Family History  Problem Relation Age of Onset   Atrial fibrillation Mother    Stroke Mother    Allergic rhinitis Mother    Allergic rhinitis Father    AAA (abdominal aortic aneurysm) Father    COPD Father    Prostate cancer Father    Atrial fibrillation Brother        ablation   Stomach cancer Paternal Grandmother        some sort of abdominal cancer   Hypertension Other        unspecified grandmother   AAA (abdominal aortic aneurysm) Other        unspecified grandfather   Cancer Other        unspecified grandfather   Neuropathy Neg Hx    Colon cancer Neg Hx    Esophageal cancer Neg Hx    Rectal cancer Neg Hx  Past Surgical History:  Procedure Laterality Date   ADENOIDECTOMY     APPENDECTOMY     BACK SURGERY  2012   at age 19 d/t scoliosis   BLEPHAROPLASTY  2018   BREAST BIOPSY     cataract surgery     COLONOSCOPY     HARDWARE REMOVAL Left 02/24/2020   Procedure: HARDWARE REMOVAL LEFT DISTAL RADIUS;  Surgeon: Murrell Drivers, MD;  Location: [facility];  Service:  Orthopedics;  Laterality: Left;   INCISION AND DRAINAGE OF WOUND Left 08/16/2023   Procedure: IRRIGATION AND DEBRIDEMENT WOUND;  Surgeon: Romona Harari, MD;  Location: [facility];  Service: Orthopedics;  Laterality: Left;   KNEE ARTHROSCOPY Bilateral    neuroma removed from foot     unsure of which foot   OPEN REDUCTION INTERNAL FIXATION (ORIF) DISTAL RADIAL FRACTURE Left 08/15/2019   Procedure: OPEN REDUCTION INTERNAL FIXATION (ORIF) DISTAL RADIAL FRACTURE;  Surgeon: Murrell Drivers, MD;  Location: [facility];  Service: Orthopedics;  Laterality: Left;  block in preop   POSTERIOR CERVICAL FUSION/FORAMINOTOMY N/A 05/29/2020   Procedure: Removal of hardware, Replacement of thoracic 8, thoracic 9, thoracic 10 screws, Right Thoracic 6-7, Right Thoracic 7-8 Laminectomies with thoracic hooks from Thoracic 4-5 to Thoracic 10;  Surgeon: Unice Pac, MD;  [facility];  Service: Neurosurgery;  Laterality: N/A;   POSTERIOR LUMBAR FUSION 4 LEVEL N/A 02/15/2018   Procedure: Decompression and fusion Thoracic nine to Lumbar two with exploration of previous fusion;  Surgeon: Unice Pac, MD;  Location: [facility];  Service: Neurosurgery;  Laterality: N/A;   SHOULDER SURGERY Right    TONSILLECTOMY     TOTAL HIP ARTHROPLASTY Right 04/15/2014   Procedure: RIGHT TOTAL HIP ARTHROPLASTY ANTERIOR APPROACH;  Surgeon: Evalene JONETTA Chancy, MD;  Location: [facility] OR;  Service: Orthopedics;  Laterality: Right;   TOTAL KNEE ARTHROPLASTY Left 11/26/2019   Procedure: TOTAL KNEE ARTHROPLASTY;  Surgeon: Ernie Cough, MD;  Location: [facility];  Service: Orthopedics;  Laterality: Left;  70 mins   VITRECTOMY Left 06/28/2023   VITRECTOMY Right 08/09/2023   WOUND EXPLORATION Left 07/15/2023   Procedure: WOUND EXPLORATION;  Surgeon: Romona Harari, MD;  Location: [facility] OR;  Service: Orthopedics;  Laterality: Left;  ARM   Social History   Social History Narrative   Retired Engineer, civil (consulting)   No children   Lives with  husband   Immunization History  Administered Date(s) Administered   Fluad Trivalent(High Dose 65+) 02/27/2023   Influenza Split 01/31/2012   Influenza Whole 02/16/2010   Influenza, High Dose Seasonal PF 12/13/2018, 02/18/2022   Influenza,inj,Quad PF,6+ Mos 01/14/2014, 01/27/2015, 02/02/2016   Influenza,inj,quad, With Preservative 02/26/2020   Influenza-Unspecified 02/24/2021   Moderna Covid-19 Fall Seasonal Vaccine 25yrs & older 03/04/2023   Moderna Sars-Covid-2 Vaccination 02/20/2022   PFIZER(Purple Top)SARS-COV-2 Vaccination 05/25/2019, 06/15/2019, 02/05/2020, 08/03/2020   Pneumococcal Conjugate-13 01/27/2015   Pneumococcal Polysaccharide-23 05/26/2017   Tdap 10/04/2011   Zoster Recombinant(Shingrix) 10/13/2017, 01/02/2018     Objective: Vital Signs: BP 114/66 (BP Location: Right Arm, Patient Position: Sitting, Cuff Size: Normal)   Pulse (!) 59   Resp 16   Ht 5' 6 (1.676 m)   Wt 179 lb 9.6 oz (81.5 kg)   BMI 28.99 kg/m    Physical Exam Eyes:     Conjunctiva/sclera: Conjunctivae normal.  Cardiovascular:     Rate and Rhythm: Normal rate and regular rhythm.  Pulmonary:     Effort: Pulmonary effort is normal.     Breath sounds:  Normal breath sounds.  Skin:    General: Skin is warm and dry.     Findings: Bruising present.  Neurological:     Mental Status: She is alert.  Psychiatric:        Mood and Affect: Mood normal.      Musculoskeletal Exam:  Shoulders full ROM no tenderness or swelling Elbows full ROM, left elbow tenderness to pressure at medial epicondyle, no palpable effusion Wrists full ROM no tenderness or swelling Fingers full ROM, some tenderness across PIPs, right 4th PIP palpable swelling Knees full ROM no tenderness or swelling Tenderness to pressure across the MTP joints, no palpable effusion   Investigation: No additional findings.  Imaging: No results found.  Recent Labs: Lab Results  Component Value Date   WBC 7.1 11/07/2023   HGB 11.5  (L) 11/07/2023   PLT 277.0 11/07/2023   NA 138 11/07/2023   K 4.8 11/07/2023   CL 102 11/07/2023   CO2 29 11/07/2023   GLUCOSE 221 (H) 11/07/2023   BUN 21 11/07/2023   CREATININE 1.14 11/07/2023   BILITOT 0.3 11/07/2023   ALKPHOS 30 (L) 11/07/2023   AST 16 11/07/2023   ALT 16 11/07/2023   PROT 6.4 11/07/2023   ALBUMIN 4.3 11/07/2023   CALCIUM  9.6 11/07/2023   GFRAA >60 11/27/2019   QFTBGOLDPLUS Negative 11/21/2022    Speciality Comments: No specialty comments available.  Procedures:  Medium Joint Inj: L elbow on 11/15/2023 4:00 PM Indications: pain Details: 27 G 1.5 in needle, medial approach Medications: 1 mL lidocaine  1 %; 40 mg triamcinolone  acetonide 40 MG/ML Outcome: tolerated well, no immediate complications Procedure, treatment alternatives, risks and benefits explained, specific risks discussed. Consent was given by the patient. Immediately prior to procedure a time out was called to verify the correct patient, procedure, equipment, support staff and site/side marked as required. Patient was prepped and draped in the usual sterile fashion.     Allergies: Advair hfa [fluticasone -salmeterol], Statins, Breo ellipta  [fluticasone  furoate-vilanterol], Fosamax [alendronate sodium], Levaquin  [levofloxacin ], Infliximab, Spiriva  handihaler [tiotropium bromide  monohydrate], Verapamil, and Tape   Assessment / Plan:     Visit Diagnoses: Seronegative rheumatoid arthritis (HCC) - gabapentin  300 mg at night Rx with PCP office. - Plan: etanercept  (ENBREL  SURECLICK) 50 MG/ML injection, predniSONE  (DELTASONE ) 5 MG tablet Inflammatory arthritis appears overall well-controlled without much peripheral joint complaint or inflammation on exam.  Current worst areas in the right foot and left elbow more structural and use related. - Continue Enbrel  50 mg subcu weekly - Continue prednisone  5 mg daily - Continue indomethacin  75 mg daily  High risk medication use - Enbrel  50 mg subcu weekly and  Indomethacin  75 mg daily - Plan: etanercept  (ENBREL  SURECLICK) 50 MG/ML injection Recent labs reviewed including blood count and metabolic panel.  Has mild abnormalities in hemoglobin and GFR but stable.  Long term (current) use of systemic steroids - prednisone  5 mg daily Age-related osteoporosis without current pathological fracture - On Prolia  had last dose February 4th.  On maintenance vitamin D  supplementation.  Foot pain with possible stress fracture and joint instability Foot pain with potential stress fracture and joint instability. She is hesitant about surgical intervention due to previous experiences with fusions.  Medial epicondylitis of left elbow Chronic left elbow pain consistent with medial epicondylitis. Previous injection in April. Pain persists. - Left elbow injection today  Chronic kidney disease stage 3A CKD stage 3A with stable GFR around 47. Indomethacin  does not appear to be contributing to kidney function decline  due to low dosage and stable GFR for a prolonged time on current dosing..  Anemia, mild, age-related Mild anemia with hemoglobin at 11.5, considered age-related and stable over time.        Orders: Orders Placed This Encounter  Procedures   Medium Joint Inj   Meds ordered this encounter  Medications   etanercept  (ENBREL  SURECLICK) 50 MG/ML injection    Sig: Inject 50 mg into the skin once a week.    Dispense:  4 mL    Refill:  2    Refill has been confirmed with patient. Please complete workflow in [facility].   predniSONE  (DELTASONE ) 5 MG tablet    Sig: Take 1 tablet (5 mg total) by mouth daily with breakfast.    Dispense:  90 tablet    Refill:  0     Follow-Up Instructions: Return in about 3 months (around 02/15/2024) for RA on ENB/GC f/u 3mos.   Lonni LELON Ester, MD  Note - This record has been created using [employer].  Chart creation errors have been sought, but may not always  have been located. Such creation errors do not  reflect on  the standard of medical care.

## 2023-11-14 DIAGNOSIS — M8588 Other specified disorders of bone density and structure, other site: Secondary | ICD-10-CM | POA: Diagnosis not present

## 2023-11-14 DIAGNOSIS — Z7952 Long term (current) use of systemic steroids: Secondary | ICD-10-CM | POA: Diagnosis not present

## 2023-11-14 LAB — HM DEXA SCAN

## 2023-11-15 ENCOUNTER — Ambulatory Visit: Attending: Internal Medicine | Admitting: Internal Medicine

## 2023-11-15 ENCOUNTER — Other Ambulatory Visit: Payer: Self-pay

## 2023-11-15 ENCOUNTER — Encounter: Payer: Self-pay | Admitting: Internal Medicine

## 2023-11-15 VITALS — BP 114/66 | HR 59 | Resp 16 | Ht 66.0 in | Wt 179.6 lb

## 2023-11-15 DIAGNOSIS — M810 Age-related osteoporosis without current pathological fracture: Secondary | ICD-10-CM | POA: Diagnosis not present

## 2023-11-15 DIAGNOSIS — M0600 Rheumatoid arthritis without rheumatoid factor, unspecified site: Secondary | ICD-10-CM

## 2023-11-15 DIAGNOSIS — Z7952 Long term (current) use of systemic steroids: Secondary | ICD-10-CM | POA: Diagnosis not present

## 2023-11-15 DIAGNOSIS — M7702 Medial epicondylitis, left elbow: Secondary | ICD-10-CM | POA: Diagnosis not present

## 2023-11-15 DIAGNOSIS — Z79899 Other long term (current) drug therapy: Secondary | ICD-10-CM | POA: Diagnosis not present

## 2023-11-15 MED ORDER — ENBREL SURECLICK 50 MG/ML ~~LOC~~ SOAJ
50.0000 mg | SUBCUTANEOUS | 2 refills | Status: DC
Start: 2023-11-15 — End: 2024-02-19
  Filled 2023-11-15 – 2023-11-30 (×2): qty 4, 28d supply, fill #0
  Filled 2023-12-29 – 2024-01-01 (×2): qty 4, 28d supply, fill #1

## 2023-11-15 MED ORDER — PREDNISONE 5 MG PO TABS: 5.0000 mg | ORAL_TABLET | Freq: Every day | ORAL | 0 refills | Status: DC

## 2023-11-17 ENCOUNTER — Encounter: Payer: Self-pay | Admitting: Physician Assistant

## 2023-11-19 MED ORDER — LIDOCAINE HCL 1 % IJ SOLN
1.0000 mL | INTRAMUSCULAR | Status: AC | PRN
  Administered 2023-11-15: 1 mL

## 2023-11-19 MED ORDER — TRIAMCINOLONE ACETONIDE 40 MG/ML IJ SUSP
40.0000 mg | INTRAMUSCULAR | Status: AC | PRN
Start: 2023-11-15 — End: 2023-11-15
  Administered 2023-11-15: 40 mg via INTRA_ARTICULAR

## 2023-11-20 ENCOUNTER — Other Ambulatory Visit (HOSPITAL_COMMUNITY): Payer: Self-pay

## 2023-11-20 ENCOUNTER — Other Ambulatory Visit: Payer: Self-pay

## 2023-11-20 ENCOUNTER — Telehealth: Payer: Self-pay | Admitting: Pharmacist

## 2023-11-20 DIAGNOSIS — M810 Age-related osteoporosis without current pathological fracture: Secondary | ICD-10-CM

## 2023-11-20 MED ORDER — DENOSUMAB 60 MG/ML ~~LOC~~ SOSY
60.0000 mg | PREFILLED_SYRINGE | SUBCUTANEOUS | 0 refills | Status: AC
  Filled 2023-11-20: qty 1, 180d supply, fill #0

## 2023-11-20 MED ORDER — DENOSUMAB 60 MG/ML ~~LOC~~ SOSY
60.0000 mg | PREFILLED_SYRINGE | Freq: Once | SUBCUTANEOUS | Status: AC
  Administered 2023-11-28 (×2): 60 mg via SUBCUTANEOUS

## 2023-11-20 NOTE — Telephone Encounter (Signed)
 Patient due for Prolia  on 11/19/2023  Per test claim, copay is $0. CMP on 11/07/2023  Rx sent to [facility] for Prolia . Scheduled for Prolia  appt on 11/27/2023  Sherry Pennant, PharmD, MPH, BCPS, CPP Clinical Pharmacist (Rheumatology and Pulmonology)

## 2023-11-20 NOTE — Progress Notes (Signed)
 Specialty Pharmacy Refill Coordination Note  Rheum Prolia /Clear Bag Patient  Vanessa Harmon is a 73 y.o. female contacted today regarding refills of specialty medication(s) Denosumab  (PROLIA )  Doses on hand: 0   Injection appointment: 11/27/23  Patient requested: Courier to Provider Office   Delivery date: 11/22/23   Verified address: [address] [place] [address]  Medication will be filled on 11/21/23.

## 2023-11-21 ENCOUNTER — Other Ambulatory Visit: Payer: Self-pay

## 2023-11-23 DIAGNOSIS — H40013 Open angle with borderline findings, low risk, bilateral: Secondary | ICD-10-CM | POA: Diagnosis not present

## 2023-11-23 DIAGNOSIS — Z961 Presence of intraocular lens: Secondary | ICD-10-CM | POA: Diagnosis not present

## 2023-11-27 ENCOUNTER — Ambulatory Visit: Admitting: Pharmacist

## 2023-11-28 ENCOUNTER — Ambulatory Visit: Attending: Internal Medicine | Admitting: Pharmacist

## 2023-11-28 DIAGNOSIS — Z7689 Persons encountering health services in other specified circumstances: Secondary | ICD-10-CM

## 2023-11-28 DIAGNOSIS — M810 Age-related osteoporosis without current pathological fracture: Secondary | ICD-10-CM | POA: Diagnosis not present

## 2023-11-28 MED ORDER — DENOSUMAB 60 MG/ML ~~LOC~~ SOSY: 60.0000 mg | PREFILLED_SYRINGE | SUBCUTANEOUS | Status: AC

## 2023-11-28 NOTE — Progress Notes (Signed)
 Pharmacy Note  Subjective:   Patient presents to clinic today to receive bi-annual dose of Prolia . Patient's last dose of Prolia  was on 05/23/2023  Patient running a fever or have signs/symptoms of infection? No  Patient currently on antibiotics for the treatment of infection? No  Patient had fall in the last 6 months?  No   Patient taking calcium  1200 mg daily through diet or supplement and at least 800 units vitamin D ? Yes  Objective: CMP     Component Value Date/Time   NA 138 11/07/2023 1336   NA 141 10/17/2023 1132   K 4.8 11/07/2023 1336   CL 102 11/07/2023 1336   CO2 29 11/07/2023 1336   GLUCOSE 221 (H) 11/07/2023 1336   BUN 21 11/07/2023 1336   BUN 23 10/17/2023 1132   CREATININE 1.14 11/07/2023 1336   CREATININE 1.17 (H) 02/15/2023 1539   CALCIUM  9.6 11/07/2023 1336   PROT 6.4 11/07/2023 1336   PROT 6.2 10/17/2023 1132   ALBUMIN 4.3 11/07/2023 1336   ALBUMIN 4.4 10/17/2023 1132   AST 16 11/07/2023 1336   ALT 16 11/07/2023 1336   ALKPHOS 30 (L) 11/07/2023 1336   BILITOT 0.3 11/07/2023 1336   BILITOT 0.4 10/17/2023 1132   GFRNONAA 57 (L) 08/15/2023 1200   GFRNONAA 57 (L) 04/13/2017 1201   GFRAA >60 11/27/2019 0244   GFRAA 66 04/13/2017 1201    CBC    Component Value Date/Time   WBC 7.1 11/07/2023 1336   RBC 3.68 (L) 11/07/2023 1336   HGB 11.5 (L) 11/07/2023 1336   HCT 35.0 (L) 11/07/2023 1336   PLT 277.0 11/07/2023 1336   MCV 95.0 11/07/2023 1336   MCH 32.0 02/15/2023 1539   MCHC 32.8 11/07/2023 1336   RDW 14.9 11/07/2023 1336   LYMPHSABS 0.8 11/07/2023 1336   MONOABS 0.6 11/07/2023 1336   EOSABS 0.1 11/07/2023 1336   BASOSABS 0.0 11/07/2023 1336    Lab Results  Component Value Date   VD25OH 64.50 06/15/2022   T-score: 11/14/2023 - statistically significant increase in BMD of left hip; She is in osteopenia range  Assessment/Plan:   Reviewed importance of adequate dietary intake of calcium  in addition to supplementation due to risk of  hypocalcemia with Prolia .  4% increase in BMD for L total femur (T score -0.5 on 11/10/2021 and -0.20 on 11/14/2023)  Patient tolerated injection well.   Administration details as below: Administrations This Visit     denosumab  (PROLIA ) injection 60 mg     Admin Date 11/28/2023 Action Given Dose 60 mg Route Subcutaneous Documented By Dayne Sherry RAMAN, RPH-CPP           Patient's next Prolia  dose is due on 05/26/2024.  Patient is due for updated DEXA in August 2027.   All questions encouraged and answered.  Instructed patient to call with any further questions or concerns.   Sherry Dayne, PharmD, MPH, BCPS, CPP Clinical Pharmacist (Rheumatology and Pulmonology)

## 2023-11-29 ENCOUNTER — Other Ambulatory Visit: Payer: Self-pay

## 2023-11-29 ENCOUNTER — Encounter (INDEPENDENT_AMBULATORY_CARE_PROVIDER_SITE_OTHER): Payer: Self-pay

## 2023-11-30 ENCOUNTER — Other Ambulatory Visit (HOSPITAL_COMMUNITY): Payer: Self-pay

## 2023-11-30 ENCOUNTER — Other Ambulatory Visit: Payer: Self-pay

## 2023-11-30 NOTE — Progress Notes (Signed)
 Clinical Intervention Note  Clinical Intervention Notes: Patient reported new medical condition, CKD. History of eGFR < 50 for 1 year (47 on 11/07/23; 51 on 08/15/23; and 47 on 10/26/22). Assessed Prolia  and Enbrel  for dose adjustment recommendations based on new medical condition; none found.   Clinical Intervention Outcomes: Prevention of an adverse drug event   Powell CHRISTELLA Gallus Specialty Pharmacist

## 2023-11-30 NOTE — Progress Notes (Signed)
 Specialty Pharmacy Refill Coordination Note  MyChart Questionnaire Submission  Vanessa Harmon is a 73 y.o. female contacted today regarding refills of specialty medication(s) Enbrel .  Doses on hand: (Patient-Rptd) One - 1, for 12/04/23  Injection date: 12/11/23  Patient requested: (Patient-Rptd) Delivery   Delivery date: 12/01/23  Verified address: [address] [place] [address]  Medication will be filled on 11/30/23.

## 2023-12-04 DIAGNOSIS — M84374D Stress fracture, right foot, subsequent encounter for fracture with routine healing: Secondary | ICD-10-CM | POA: Diagnosis not present

## 2023-12-04 DIAGNOSIS — M7741 Metatarsalgia, right foot: Secondary | ICD-10-CM | POA: Diagnosis not present

## 2023-12-04 DIAGNOSIS — M2041 Other hammer toe(s) (acquired), right foot: Secondary | ICD-10-CM | POA: Diagnosis not present

## 2023-12-12 ENCOUNTER — Other Ambulatory Visit (INDEPENDENT_AMBULATORY_CARE_PROVIDER_SITE_OTHER)

## 2023-12-12 DIAGNOSIS — N1831 Chronic kidney disease, stage 3a: Secondary | ICD-10-CM

## 2023-12-12 LAB — COMPREHENSIVE METABOLIC PANEL WITH GFR
ALT: 17 U/L (ref 0–35)
AST: 16 U/L (ref 0–37)
Albumin: 4.2 g/dL (ref 3.5–5.2)
Alkaline Phosphatase: 36 U/L — ABNORMAL LOW (ref 39–117)
BUN: 18 mg/dL (ref 6–23)
CO2: 29 meq/L (ref 19–32)
Calcium: 9.2 mg/dL (ref 8.4–10.5)
Chloride: 105 meq/L (ref 96–112)
Creatinine, Ser: 1.03 mg/dL (ref 0.40–1.20)
GFR: 53.53 mL/min — ABNORMAL LOW (ref >60.00)
Glucose, Bld: 100 mg/dL — ABNORMAL HIGH (ref 70–99)
Potassium: 4.8 meq/L (ref 3.5–5.1)
Sodium: 142 meq/L (ref 135–145)
Total Bilirubin: 0.5 mg/dL (ref 0.2–1.2)
Total Protein: 6.5 g/dL (ref 6.0–8.3)

## 2023-12-12 LAB — CBC WITH DIFFERENTIAL/PLATELET
Basophils Absolute: 0.1 K/uL (ref 0.0–0.1)
Basophils Relative: 0.9 % (ref 0.0–3.0)
Eosinophils Absolute: 0.1 K/uL (ref 0.0–0.7)
Eosinophils Relative: 1.3 % (ref 0.0–5.0)
HCT: 38.2 % (ref 36.0–46.0)
Hemoglobin: 12.4 g/dL (ref 12.0–15.0)
Lymphocytes Relative: 15.4 % (ref 12.0–46.0)
Lymphs Abs: 1.2 K/uL (ref 0.7–4.0)
MCHC: 32.5 g/dL (ref 30.0–36.0)
MCV: 95.7 fl (ref 78.0–100.0)
Monocytes Absolute: 0.7 K/uL (ref 0.1–1.0)
Monocytes Relative: 8.7 % (ref 3.0–12.0)
Neutro Abs: 5.6 K/uL (ref 1.4–7.7)
Neutrophils Relative %: 73.7 % (ref 43.0–77.0)
Platelets: 301 K/uL (ref 150.0–400.0)
RBC: 3.99 Mil/uL (ref 3.87–5.11)
RDW: 16.2 % — ABNORMAL HIGH (ref 11.5–15.5)
WBC: 7.6 K/uL (ref 4.0–10.5)

## 2023-12-12 LAB — HEMOGLOBIN A1C: Hgb A1c MFr Bld: 6.4 % (ref 4.6–6.5)

## 2023-12-13 ENCOUNTER — Ambulatory Visit: Payer: Self-pay | Admitting: Physician Assistant

## 2023-12-13 ENCOUNTER — Other Ambulatory Visit: Payer: Self-pay | Admitting: Physician Assistant

## 2023-12-21 ENCOUNTER — Other Ambulatory Visit (HOSPITAL_COMMUNITY): Payer: Self-pay

## 2023-12-25 ENCOUNTER — Encounter: Payer: Self-pay | Admitting: Internal Medicine

## 2023-12-29 ENCOUNTER — Other Ambulatory Visit: Payer: Self-pay

## 2024-01-01 ENCOUNTER — Other Ambulatory Visit: Payer: Self-pay | Admitting: Pharmacy Technician

## 2024-01-01 ENCOUNTER — Encounter (INDEPENDENT_AMBULATORY_CARE_PROVIDER_SITE_OTHER): Payer: Self-pay

## 2024-01-01 ENCOUNTER — Other Ambulatory Visit: Payer: Self-pay

## 2024-01-01 ENCOUNTER — Encounter: Payer: Self-pay | Admitting: Internal Medicine

## 2024-01-01 NOTE — Progress Notes (Signed)
 Specialty Pharmacy Refill Coordination Note  Vanessa Harmon is a 74 y.o. female contacted today regarding refills of specialty medication(s) Etanercept  (Enbrel  SureClick)   Patient requested Delivery   Delivery date: 02/13/24   Verified address: [address] [place], [place]   Medication will be filled on 02/12/24.  Injection due on 11/3 -  patient reported she will be having surgery & was instructed to hold the Enbrel  3 weeks prior and after.

## 2024-01-04 ENCOUNTER — Other Ambulatory Visit: Payer: Self-pay

## 2024-01-09 DIAGNOSIS — J4550 Severe persistent asthma, uncomplicated: Secondary | ICD-10-CM | POA: Diagnosis not present

## 2024-01-09 DIAGNOSIS — J329 Chronic sinusitis, unspecified: Secondary | ICD-10-CM | POA: Diagnosis not present

## 2024-01-09 DIAGNOSIS — J310 Chronic rhinitis: Secondary | ICD-10-CM | POA: Diagnosis not present

## 2024-01-17 DIAGNOSIS — M47816 Spondylosis without myelopathy or radiculopathy, lumbar region: Secondary | ICD-10-CM | POA: Diagnosis not present

## 2024-01-17 DIAGNOSIS — Z5181 Encounter for therapeutic drug level monitoring: Secondary | ICD-10-CM | POA: Diagnosis not present

## 2024-01-17 DIAGNOSIS — M961 Postlaminectomy syndrome, not elsewhere classified: Secondary | ICD-10-CM | POA: Diagnosis not present

## 2024-01-17 DIAGNOSIS — M25511 Pain in right shoulder: Secondary | ICD-10-CM | POA: Diagnosis not present

## 2024-01-18 DIAGNOSIS — M25511 Pain in right shoulder: Secondary | ICD-10-CM | POA: Diagnosis not present

## 2024-01-20 ENCOUNTER — Other Ambulatory Visit: Payer: Self-pay | Admitting: Physician Assistant

## 2024-01-23 DIAGNOSIS — M24574 Contracture, right foot: Secondary | ICD-10-CM | POA: Diagnosis not present

## 2024-01-23 DIAGNOSIS — M7741 Metatarsalgia, right foot: Secondary | ICD-10-CM | POA: Diagnosis not present

## 2024-01-23 DIAGNOSIS — M21621 Bunionette of right foot: Secondary | ICD-10-CM | POA: Diagnosis not present

## 2024-01-23 DIAGNOSIS — G8918 Other acute postprocedural pain: Secondary | ICD-10-CM | POA: Diagnosis not present

## 2024-01-23 DIAGNOSIS — M2041 Other hammer toe(s) (acquired), right foot: Secondary | ICD-10-CM | POA: Diagnosis not present

## 2024-01-23 DIAGNOSIS — M19071 Primary osteoarthritis, right ankle and foot: Secondary | ICD-10-CM | POA: Diagnosis not present

## 2024-01-24 ENCOUNTER — Other Ambulatory Visit: Payer: Self-pay

## 2024-01-30 ENCOUNTER — Other Ambulatory Visit: Payer: Self-pay

## 2024-01-31 ENCOUNTER — Other Ambulatory Visit: Payer: Self-pay

## 2024-01-31 ENCOUNTER — Other Ambulatory Visit: Payer: Self-pay | Admitting: Physician Assistant

## 2024-01-31 DIAGNOSIS — R609 Edema, unspecified: Secondary | ICD-10-CM

## 2024-01-31 NOTE — Progress Notes (Signed)
 Specialty Pharmacy Ongoing Clinical Assessment Note  Vanessa Harmon is a 73 y.o. female who is being followed by the specialty pharmacy service for RxSp Rheumatoid Arthritis   Patient's specialty medication(s) reviewed today: Etanercept  (Enbrel  SureClick)   Missed doses in the last 4 weeks: All   Patient/Caregiver did not have any additional questions or concerns.   Therapeutic benefit summary: Unable to assess   Adverse events/side effects summary: No adverse events/side effects   Patient's therapy is appropriate to: Continue (Patient has had to hold treatment for several months due to foot surgery, will restart therapy on 10/27.)    Goals Addressed             This Visit's Progress    Minimize recurrence of flares       Patient is currently holding therapy post surgery and will resume on 02/12/24. Patient will maintain adherence and avoid flare triggers once she restarts.           Follow up: 12 months  [name] Specialty Pharmacist

## 2024-02-05 DIAGNOSIS — M21621 Bunionette of right foot: Secondary | ICD-10-CM | POA: Diagnosis not present

## 2024-02-05 DIAGNOSIS — M25374 Other instability, right foot: Secondary | ICD-10-CM | POA: Diagnosis not present

## 2024-02-05 DIAGNOSIS — M2041 Other hammer toe(s) (acquired), right foot: Secondary | ICD-10-CM | POA: Diagnosis not present

## 2024-02-06 NOTE — Progress Notes (Signed)
 Office Visit Note  Patient: Vanessa Harmon             Date of Birth: 08/17/1950           MRN: [id number]             PCP: Job Lukes, PA Referring: Job Lukes, PA Visit Date: 02/19/2024   Subjective:  Rheumatoid Arthritis   Discussed the use of AI scribe software for clinical note transcription with the patient, who gave verbal consent to proceed.  History of Present Illness   Vanessa Harmon is a 73 y.o. female here for follow up for seronegative RA on Enbrel  50 mg weekly, prednisone  5 mg daily, and indomethacin  75 mg daily.  She is following up after foot surgery since last visit.  She underwent foot surgery and is currently in a cast. She is non-weight bearing and has been living in the basement as her wheelchair does not fit through the doorways on the main floor. She anticipates being in the cast for two more weeks.  She recently restarted Enbrel  after a perioperative pause. She experiences swelling and pain in her hands, particularly in the right hand's fourth PIP joint, and some discomfort in her left elbow. Her right shoulder is described as 'bone on bone', and she received an injection a week before surgery, though she is unsure of its efficacy. She cannot lie on her right side due to shoulder pain.  She takes Lasix  daily for fluid management. She experiences leg swelling, which she attributes to venous insufficiency as her valves do not function properly. She has a history of a partially blocked artery in her leg. Recent labs in August showed a stable GFR.       Previous HPI 11/15/2023 Vanessa Harmon is a 73 y.o. female here for follow up for seronegative RA on Enbrel  50 mg weekly, prednisone  5 mg daily, and indomethacin  75 mg daily.     She has a history of chronic kidney disease, with recent glomerular filtration rate (GFR) readings of 47. Previous GFR readings were 50 six months ago, 47 a year ago, and 45 earlier last year.   She is on diclofenac, a  nonsteroidal anti-inflammatory drug (NSAID), for arthritis, which can potentially affect kidney function. She is not on maximal doses of NSAIDs. She is also on azithromycin three times a week as an anti-inflammatory. No recent infections or need for antibiotics.   She has ongoing issues with her foot, suspecting another stress fracture and experiencing significant pain in her toes. She mentions a sagging joint and a previous discussion about a potential CT scan and joint fusion, which she is hesitant to pursue due to past experiences with multiple back fusions.   She reports persistent pain in her elbow, consistent with previous episodes of epicondylitis. The pain is described as 'the same old, same old,' with soreness primarily on the medial side. She has received injections for this in the past, with the last one administered in April with Dr. Romona.   Recent lab work, including blood count and metabolic panel, was reviewed. Her hemoglobin is slightly below the normal range at 11.5, but this is not a new finding. Her absolute neutrophil count is normal, and there are no concerning changes in her blood work.      08/14/2023 Vanessa Harmon is a 73 y.o. female here for follow up for seronegative RA on Enbrel  50 mg weekly, prednisone  5 mg daily, and indomethacin  75 mg daily.  She is scheduled for a repeat surgery on Wednesday to address a dog bite wound on her left forearm that requires debridement. The initial injury involved significant bleeding due to a vessel tear, necessitating surgical intervention. She notes that the skin on her arm is thin, which may complicate suturing.   She is currently on Enbrel  for arthritis, and there is consideration about holding the medication before and after the procedure due to concerns about wound healing. She is also taking indomethacin  for pain management but plans to skip it on the day of the surgery to avoid increased bleeding risk.   Her arthritis is  generally well-managed, although she experiences stiffness in her hands, attributed to osteoarthritis. No recent illnesses or infections are reported.   Also had latest prolia  injection 05/23/23 for osteoporosis management. This was uneventful. No new falls.   05/16/2023 Vanessa Harmon is a 73 y.o. female here for follow up for seronegative RA on Enbrel  50 mg weekly, prednisone  5 mg daily, and indomethacin  75 mg daily.   They have been managing their rheumatoid arthritis with Enbrel , which they have been able to obtain on time without any gaps in administration. Over the past month, they have noticed significant improvement in their symptoms, with no swelling in their hands and resolution of pain in their knuckles. Previously, they had an injection for their elbow, but since starting Enbrel , their elbow has not been problematic. They also had their shoulders injected a couple of weeks ago, which has been helpful in managing their symptoms.   They are currently taking 5 mg of prednisone  and indomethacin  once a day. Since switching to a citrate-free formulation of Enbrel , they have not experienced any more rashes or injection site irritation.   They had a cold in early January, which resolved without the need for antibiotics. The last time they experienced cold or upper respiratory symptoms was in June of the previous year. No recent upper respiratory infections aside from the cold in January.      02/15/2023 Vanessa Harmon is a 73 y.o. female here for follow up for seronegative RA on prednisone  5 mg daily and indomethacin  75 mg daily and started Enbrel  in August.  So far she is not certain about the amount of difference with addition of the Enbrel .  Is not experiencing any injection related reactions.  She has had some additional viral infections that she is not sure if they are related to medicine or incidental.  She had bilateral shoulder steroid injections with Dr. Melvenia pain is getting worse  again in these areas.  Also feels increase in left elbow pain that is similar to the episode of epicondylitis which we saw in March. She had called in about perioperative management of Enbrel  for surgery for toe joint instability.  This was delayed because her husband had a dog hand bite with skin tearing around the same time. Also had mildly low hemoglobin on routine labs with her primary care PA.  Subsequent fecal occult stool card was positive for blood and so was referred to gastroenterology.  Has not had colonoscopy in years when she did there were no concerning findings.  She has a history of previous internal hemorrhoids.  She is still on the indomethacin .   Previous HPI 11/15/2022 AKIBA MELFI is a 73 y.o. female here for follow up for seronegative RA on prednisone  5 mg daily and indomethacin  75 mg daily.  She stopped taking Rinvoq  since about 1 month ago as she felt  this was worsening her frequency of upper respiratory infection and needing inhaled bronchodilator and breathing treatments.  Was also having increased symptoms during this time due to seasonal allergies.  Since she stopped the Rinvoq  feels this is improved although today is a bad day with persistent coughing.  Noticed small increase in joint pain of several areas including her shoulders and hips and knee.  She had repeat corticosteroid injection in both shoulders which has been improving her symptoms range of motion is better still pain with use.  Persistent bruising and skin rashes with a new avulsion on the left forearm but about the same as usual.   Labs reviewed 10/2022 WBC 11.9 Hgb 11.8 eGFR 47.27   DMARD Hx Methotrexate  - LFT elevations Remicade- Rashes Actemra  - Rashes Orencia  IV - Secondary nonresponder Rinvoq  - Respiratory symptoms worse     Previous HPI 09/21/2022 HIBO BLASDELL is a 73 y.o. female here for follow up here for follow-up of seronegative RA on Rinvoq  15 mg p.o. daily and prednisone  5 mg  daily.  The left elbow pain discussed at her last visit improved after steroid injection and has continued to do well.  Joint pain and inflammation doing pretty well overall chronic back pain is her biggest issue.  Continues having easy bruising and skin tears frequently from her 90 pound Rottweiler.  Recently sick with sinus infection for the past week is currently on amoxicillin  for this.  Not associated with any fevers lymphadenopathy or other constitutional symptoms.   Previous HPI 07/13/22 AVIENDHA AZBELL is a 73 y.o. female here for follow up for seronegative RA on rinvoq  15 mg PO daily and prednisone  5 mg daily.  Since her last visit she finished getting over the persistent sinus infection symptoms after recent COVID illness.  However once the symptoms cleared up she started noticing increase in hip bursitis pain.  Also started getting worsening pain along the medial side of the left elbow.  Most noticeable when using her hands to help her self push off from a seated position due to her hip pain.  Not noticed any visible swelling or erythema she still has bruising around the elbow but that is more chronic and there are multiple skin tears in the area as well.  Does not feel like she is having flareup of increased joint symptoms outside of the hip and elbow pains.  She previously tried use of the elbow brace which we discussed before for her medial epicondylitis but just gets irritation around the skin and elbow does not feel much benefit.   Previous HPI 06/16/22 MICHAELEEN DOWN is a 73 y.o. female here for follow up for seronegative RA on prednisone  5 mg daily and rinvoq  15 mg daily started since 2/18. Also prolia  for osteoporosis last injection on 1/24 without complication. Main issue has been sickness with COVID positive test 2/8 and persistent symptoms. She had subsequent sinus infection treated with augmentin  starting 2/21 and had repeat labs checked yesterday. Brain fog, fatigue worse during  COVID infection and partially improved since. She thinks rinvoq  is starting to help with swelling and pain in hands and wrists but still significant overall symptoms. Has some skin tears and bruising on arms from her dog.   Previous HPI 03/16/22 MARIANNE GOLIGHTLY is a 73 y.o. female here for follow up for seropositive RA on prednisone  5 mg daily. Methotrexate  was discontinued due to worsening laboratory function tests and Actemra  stopped due to developing palpable rashes throughout her legs similar  to previous medication reaction to infliximab. She is also noticed easier bruising than usual and has seen discoloration of her nails with some separation and cracking.  Prednisone  is helping somewhat but still has ongoing joint pain and stiffness specially off of her previous medications.  In particular bothering her lately has been persistent pain along the medial side of her left elbow does not recall any specific injury to this area but does feel like there is a sense of swelling and pressure.   Previous HPI 12/14/21 ALIANAH LOFTON is a 73 y.o. female here for follow up for seropositive RA on methotrexate  15 mg subcu weekly prednisone  5 mg daily leucovorin  1 mg daily and after starting Actemra  162 mg [place] q. 14 days.  She feels like arthritis symptoms have been well controlled on her current medications.  No significant flareups.  She still gets swelling in her wrist but no other visible joint swelling.  She has had pretty extensive bruising and easy skin tearing on both arms often from playing with her dog.  Lab tests checked in July did show appears to be a small decrease in estimated GFR.   Previous HPI 09/10/2021 ARIN VANOSDOL is a 73 y.o. female here for evaluation of chronic back pain associated with seropositive RA she has been seeing Rosaline Salt on treatment with orencia  IV, MTX 15 mg [place] weekly, leucovorin  1 mg daily, and prednisone  5 mg daily.  She was originally diagnosed with inflammatory  arthritis by Dr. Lenon about a decade ago.  Apparently the worst initial symptom at that time was left hip pain and inflammation with concerning findings on MRI.  Was started on methotrexate  as a steroid sparing DMARD treatment.  Apparently had initial concern for seronegative rheumatoid arthritis versus PMR presentation.  She did not tolerate methotrexate  well initially worst side effect was substantial hair loss.  Also did not tolerate well on leflunomide.  She was started on infliximab treatment developed a significant adverse reaction with new skin rashes in multiple areas and severe finger and toe nail changes that lasted about a year after discontinuing the medication.  She was started on IV Orencia  monthly infusions which has been beneficial for symptoms but still notices some amount ongoing and that symptoms tend to worsen between each dosing interval.  She was restarted on methotrexate  this time with leucovorin  in the past few months and this has improved her joint inflammation further.  She remains on low-dose prednisone  5 mg daily throughout most of this time.  Most recent definite flareup of her rheumatoid arthritis was about 6 months ago. In addition to inflammatory joint disease she has significant amount of generalized osteoarthritis.  She reports bone-on-bone degenerative changes with slightly impaired range of motion in the left shoulder.  She had previous left knee total arthroplasty.  She has degenerative arthritis at multiple levels in the spine with history of repeat lumbar spine surgery.  She has some spinal and foraminal stenosis with radicular symptoms.  She is also had recurrent problems with hip bursitis has been responsive to local steroid injection treatments with Dr. Ernie.  She takes gabapentin  300 mg at night and as needed Robaxin  500 mg for the ongoing symptoms. She has not had any history with recurrent infections or major side effects on her current regimen.  She does have  extensive bruising mostly worst in the past year and worst on extensor surfaces of arms and legs.  She denies any history of abnormal bleeding and blood clots.  She has chronic dry eyes and sees Dr. Octavia currently uses xiidra  and genteal drops for this.   Review of Systems  Constitutional:  Positive for fatigue.  HENT:  Negative for mouth sores and mouth dryness.   Eyes:  Positive for dryness.  Respiratory:  Negative for shortness of breath.   Cardiovascular:  Negative for chest pain and palpitations.  Gastrointestinal:  Negative for blood in stool, constipation and diarrhea.  Endocrine: Negative for increased urination.  Genitourinary:  Negative for involuntary urination.  Musculoskeletal:  Positive for joint pain, gait problem, joint pain, myalgias, muscle tenderness and myalgias. Negative for joint swelling, muscle weakness and morning stiffness.  Skin:  Negative for color change, rash, hair loss and sensitivity to sunlight.  Allergic/Immunologic: Negative for susceptible to infections.  Neurological:  Negative for dizziness and headaches.  Hematological:  Negative for swollen glands.  Psychiatric/Behavioral:  Positive for depressed mood and sleep disturbance. The patient is not nervous/anxious.     PMFS History:  Patient Active Problem List   Diagnosis Date Noted   CKD stage 3a, GFR 45-59 ml/min (HCC) 11/08/2023   Medial epicondylitis of left elbow 03/16/2022   High risk medication use 09/10/2021   Posterior vitreous detachment of left eye 09/29/2020   Thoracic spondylosis with cord compression 05/29/2020   Heterotopic ossification 03/30/2020   Loosening of hardware in spine 01/27/2020   Posterior vitreous detachment of right eye 01/21/2020   Pseudophakia 01/21/2020   Horseshoe retinal tear of right eye 01/21/2020   Body mass index (BMI) 28.0-28.9, adult 11/27/2019   Left knee OA 11/26/2019   Numbness of foot 02/07/2019   Seronegative rheumatoid arthritis (HCC) 01/25/2019    Peripheral polyneuropathy 01/14/2019   Spinal stenosis, thoracolumbar region 01/14/2019   Thoracogenic scoliosis, thoracolumbar region 01/14/2019   Idiopathic scoliosis of thoracolumbar region 02/15/2018   Rhinitis, chronic 05/16/2017   Moderate persistent asthma without complication 05/04/2017   History of polymyalgia rheumatica 02/15/2015   DJD (degenerative joint disease) 04/15/2014   Lumbar spine scoliosis 11/28/2013   Degenerative spondylolisthesis 10/09/2013   Lumbar radiculopathy 09/16/2013   Lumbosacral radiculitis 07/31/2013   Scoliosis, or kyphoscoliosis, idiopathic 07/31/2013   Lumbar back pain 12/19/2011   Hyperlipidemia 10/16/2010   Depression 10/16/2010   Lumbar spondylosis 10/16/2010   Gastroesophageal reflux disease 10/16/2010   Adenomatous polyp of colon 10/16/2010   History of paroxysmal supraventricular tachycardia 10/16/2010   Essential (primary) hypertension 10/16/2010    Past Medical History:  Diagnosis Date   Adenomatous polyp    Allergy    Anxiety    takes Ativan  daily as needed   Arthritis    inflammatory arthritis (Dr. RONAL Ludie Piety)   Asthma    Cataract    Chronic back pain    scoliosis    Chronic kidney disease 11/07/2023   Constipation    takes miralax  every other day   COVID 05/19/2022   Depression    takes Lexapro  daily   Eczema    Essential hypertension, benign    takes Hyzaar and Metoprolol  daily   Family history of adverse reaction to anesthesia    Sister PONV   Fibromyalgia    GERD (gastroesophageal reflux disease)    History of colon polyps    History of shingles    Hyperlipidemia    takes Atorvastatin  daily   Insomnia    takes Melatonin nightly   Joint pain    Osteoarthritis    Osteoporosis    PSVT (paroxysmal supraventricular tachycardia)    Recurrent upper respiratory  infection (URI)    Rheumatoid arthritis (HCC)    sees Dr. Mai; stable on Orencia    Rhinitis, allergic    uses Flonase  daily   Urinary urgency     Urticaria     Family History  Problem Relation Age of Onset   Atrial fibrillation Mother    Stroke Mother    Allergic rhinitis Mother    Hypertension Mother    Miscarriages / Stillbirths Mother    Allergic rhinitis Father    AAA (abdominal aortic aneurysm) Father    COPD Father    Prostate cancer Father    Cancer Father    Depression Father    Hearing loss Father    Atrial fibrillation Brother        ablation   Stomach cancer Paternal Grandmother        some sort of abdominal cancer   Hypertension Other        unspecified grandmother   AAA (abdominal aortic aneurysm) Other        unspecified grandfather   Cancer Other        unspecified grandfather   Neuropathy Neg Hx    Colon cancer Neg Hx    Esophageal cancer Neg Hx    Rectal cancer Neg Hx    Past Surgical History:  Procedure Laterality Date   ADENOIDECTOMY     APPENDECTOMY     BACK SURGERY  2012   at age 29 d/t scoliosis   BLEPHAROPLASTY  2018   BREAST BIOPSY     cataract surgery     COLONOSCOPY     EYE SURGERY     FOOT SURGERY Right 01/23/2024   FRACTURE SURGERY     HARDWARE REMOVAL Left 02/24/2020   Procedure: HARDWARE REMOVAL LEFT DISTAL RADIUS;  Surgeon: Murrell Drivers, MD;  Location: [facility];  Harmon: Orthopedics;  Laterality: Left;   INCISION AND DRAINAGE OF WOUND Left 08/16/2023   Procedure: IRRIGATION AND DEBRIDEMENT WOUND;  Surgeon: Romona Harari, MD;  Location: [facility];  Harmon: Orthopedics;  Laterality: Left;   JOINT REPLACEMENT     KNEE ARTHROSCOPY Bilateral    neuroma removed from foot     unsure of which foot   OPEN REDUCTION INTERNAL FIXATION (ORIF) DISTAL RADIAL FRACTURE Left 08/15/2019   Procedure: OPEN REDUCTION INTERNAL FIXATION (ORIF) DISTAL RADIAL FRACTURE;  Surgeon: Murrell Drivers, MD;  Location: [facility];  Harmon: Orthopedics;  Laterality: Left;  block in preop   POSTERIOR CERVICAL FUSION/FORAMINOTOMY N/A 05/29/2020   Procedure:  Removal of hardware, Replacement of thoracic 8, thoracic 9, thoracic 10 screws, Right Thoracic 6-7, Right Thoracic 7-8 Laminectomies with thoracic hooks from Thoracic 4-5 to Thoracic 10;  Surgeon: Unice Pac, MD;  Location: [facility] OR;  Harmon: Neurosurgery;  Laterality: N/A;   POSTERIOR LUMBAR FUSION 4 LEVEL N/A 02/15/2018   Procedure: Decompression and fusion Thoracic nine to Lumbar two with exploration of previous fusion;  Surgeon: Unice Pac, MD;  Location: [facility] OR;  Harmon: Neurosurgery;  Laterality: N/A;   SHOULDER SURGERY Right    SPINE SURGERY     TONSILLECTOMY     TOTAL HIP ARTHROPLASTY Right 04/15/2014   Procedure: RIGHT TOTAL HIP ARTHROPLASTY ANTERIOR APPROACH;  Surgeon: Evalene JONETTA Chancy, MD;  Location: [facility] OR;  Harmon: Orthopedics;  Laterality: Right;   TOTAL KNEE ARTHROPLASTY Left 11/26/2019   Procedure: TOTAL KNEE ARTHROPLASTY;  Surgeon: Ernie Cough, MD;  Location: [facility];  Harmon: Orthopedics;  Laterality: Left;  70 mins  VITRECTOMY Left 06/28/2023   VITRECTOMY Right 08/09/2023   WOUND EXPLORATION Left 07/15/2023   Procedure: WOUND EXPLORATION;  Surgeon: Romona Harari, MD;  Location: [facility] OR;  Harmon: Orthopedics;  Laterality: Left;  ARM   Social History   Social History Narrative   Retired engineer, civil (consulting)   No children   Lives with husband   Immunization History  Administered Date(s) Administered   Fluad Trivalent(High Dose 65+) 02/27/2023   INFLUENZA, HIGH DOSE SEASONAL PF 12/13/2018, 02/18/2022   Influenza Split 01/31/2012   Influenza Whole 02/16/2010   Influenza,inj,Quad PF,6+ Mos 01/14/2014, 01/27/2015, 02/02/2016   Influenza,inj,quad, With Preservative 02/26/2020   Influenza-Unspecified 02/24/2021   Moderna Covid-19 Fall Seasonal Vaccine 47yrs & older 03/04/2023   Moderna Sars-Covid-2 Vaccination 02/20/2022   PFIZER(Purple Top)SARS-COV-2 Vaccination 05/25/2019, 06/15/2019, 02/05/2020, 08/03/2020   Pneumococcal Conjugate-13 01/27/2015   Pneumococcal  Polysaccharide-23 05/26/2017   Tdap 10/04/2011   Zoster Recombinant(Shingrix) 10/13/2017, 01/02/2018     Objective: Vital Signs: BP (!) 145/79 (BP Location: Left Arm, Patient Position: Sitting, Cuff Size: Small)   Pulse (!) 58   Temp 97.7 F (36.5 C)   Resp 13   Ht 5' 5.5 (1.664 m)   BMI 29.43 kg/m    Physical Exam Eyes:     Conjunctiva/sclera: Conjunctivae normal.  Cardiovascular:     Rate and Rhythm: Normal rate and regular rhythm.  Pulmonary:     Effort: Pulmonary effort is normal.     Breath sounds: Normal breath sounds.  Lymphadenopathy:     Cervical: No cervical adenopathy.  Skin:    General: Skin is warm and dry.     Findings: Bruising present.     Comments: No pitting edema  Neurological:     Mental Status: She is alert.  Psychiatric:        Mood and Affect: Mood normal.      Musculoskeletal Exam:  Neck full ROM no tenderness Shoulders full ROM no tenderness or swelling Elbows full ROM no tenderness or swelling Wrists full ROM no tenderness or swelling Fingers swelling including right 2nd and 4th PIPs, left 1st IP and 3rd-5th PIPs Knees full ROM no tenderness or swelling Right foot in boot   Investigation: No additional findings.  Imaging: No results found.  Recent Labs: Lab Results  Component Value Date   WBC 10.8 02/19/2024   HGB 12.2 02/19/2024   PLT 287 02/19/2024   NA 138 02/19/2024   K 4.2 02/19/2024   CL 102 02/19/2024   CO2 27 02/19/2024   GLUCOSE 149 (H) 02/19/2024   BUN 14 02/19/2024   CREATININE 0.85 02/19/2024   BILITOT 0.5 02/19/2024   ALKPHOS 36 (L) 12/12/2023   AST 16 02/19/2024   ALT 16 02/19/2024   PROT 6.4 02/19/2024   ALBUMIN 4.2 12/12/2023   CALCIUM  9.5 02/19/2024   GFRAA >60 11/27/2019   QFTBGOLDPLUS Negative 11/21/2022    Speciality Comments: No specialty comments available.  Procedures:  No procedures performed Allergies: Advair hfa [fluticasone -salmeterol], Statins, Breo ellipta  [fluticasone   furoate-vilanterol], Fosamax [alendronate sodium], Infliximab, Spiriva  handihaler [tiotropium bromide ], Verapamil, and Tape   Assessment / Plan:     Visit Diagnoses: Seronegative rheumatoid arthritis (HCC) - Plan: etanercept  (ENBREL  SURECLICK) 50 MG/ML injection, predniSONE  (DELTASONE ) 5 MG tablet, Sedimentation rate, C-reactive protein Flare with synovitis in right hand PIP joints, likely due to recent foot surgery and Enbrel  interruption. - Enbrel  50 mg [place] weekly - Recheck inflammatory markers today ESR/CRP - Continue prednisone  5 mg daily - Continue indomethacin  75 mg daily  High  risk medication use - Enbrel  50 mg subcu weekly and Indomethacin  75 mg daily - Plan: etanercept  (ENBREL  SURECLICK) 50 MG/ML injection, CBC with Differential/Platelet, Comprehensive metabolic panel with GFR - Checking CBC and CMP for medication monitoring, reviewed abnormal but fairly stable GFR  Medial epicondylitis of elbow, left - S/P-Left Elbow 11/15/2023  Primary osteoarthritis, right shoulder Primary osteoarthritis with bone-on-bone changes, recent injection with uncertain benefit, aggravated by positioning.  Chronic kidney disease stage 3a CKD stage 3a with GFR in the 50s. Discussed implications on medication dosing and fluid management. Emphasized controlling blood pressure and blood sugar to prevent decline.  Chronic lower extremity venous insufficiency with edema Chronic venous insufficiency with edema, worsened by immobility post foot surgery. Discussed venous valve dysfunction and reduced mobility impact. Currently no significant edema on exam, is on lasix  for management.       Orders: Orders Placed This Encounter  Procedures   Sedimentation rate   C-reactive protein   CBC with Differential/Platelet   Comprehensive metabolic panel with GFR   Meds ordered this encounter  Medications   etanercept  (ENBREL  SURECLICK) 50 MG/ML injection    Sig: Inject 50 mg into the skin once a week.     Dispense:  4 mL    Refill:  2   predniSONE  (DELTASONE ) 5 MG tablet    Sig: Take 1 tablet (5 mg total) by mouth daily with breakfast.    Dispense:  90 tablet    Refill:  0     Follow-Up Instructions: Return in about 3 months (around 05/21/2024) for RA on ENB/GC/NSAID f/u 3mos.   Lonni LELON Ester, MD  Note - This record has been created using [employer].  Chart creation errors have been sought, but may not always  have been located. Such creation errors do not reflect on  the standard of medical care.

## 2024-02-12 ENCOUNTER — Other Ambulatory Visit: Payer: Self-pay

## 2024-02-19 ENCOUNTER — Encounter: Payer: Self-pay | Admitting: Internal Medicine

## 2024-02-19 ENCOUNTER — Ambulatory Visit: Attending: Internal Medicine | Admitting: Internal Medicine

## 2024-02-19 ENCOUNTER — Other Ambulatory Visit: Payer: Self-pay

## 2024-02-19 VITALS — BP 145/79 | HR 58 | Temp 97.7°F | Resp 13 | Ht 65.5 in

## 2024-02-19 DIAGNOSIS — Z7952 Long term (current) use of systemic steroids: Secondary | ICD-10-CM

## 2024-02-19 DIAGNOSIS — M0600 Rheumatoid arthritis without rheumatoid factor, unspecified site: Secondary | ICD-10-CM | POA: Diagnosis not present

## 2024-02-19 DIAGNOSIS — Z79899 Other long term (current) drug therapy: Secondary | ICD-10-CM | POA: Diagnosis not present

## 2024-02-19 DIAGNOSIS — M7702 Medial epicondylitis, left elbow: Secondary | ICD-10-CM | POA: Diagnosis not present

## 2024-02-19 MED ORDER — PREDNISONE 5 MG PO TABS: 5.0000 mg | ORAL_TABLET | Freq: Every day | ORAL | 0 refills | Status: AC

## 2024-02-19 MED ORDER — ENBREL SURECLICK 50 MG/ML ~~LOC~~ SOAJ
50.0000 mg | SUBCUTANEOUS | 2 refills | Status: AC
  Filled 2024-02-19 – 2024-03-04 (×2): qty 4, 28d supply, fill #0
  Filled 2024-03-27: qty 4, 28d supply, fill #1
  Filled 2024-05-03: qty 4, 28d supply, fill #2

## 2024-02-20 LAB — COMPREHENSIVE METABOLIC PANEL WITH GFR
AG Ratio: 1.9 (calc) (ref 1.0–2.5)
ALT: 16 U/L (ref 6–29)
AST: 16 U/L (ref 10–35)
Albumin: 4.2 g/dL (ref 3.6–5.1)
Alkaline phosphatase (APISO): 42 U/L (ref 37–153)
BUN: 14 mg/dL (ref 7–25)
CO2: 27 mmol/L (ref 20–32)
Calcium: 9.5 mg/dL (ref 8.6–10.4)
Chloride: 102 mmol/L (ref 98–110)
Creat: 0.85 mg/dL (ref 0.60–1.00)
Globulin: 2.2 g/dL (ref 1.9–3.7)
Glucose, Bld: 149 mg/dL — ABNORMAL HIGH (ref 65–99)
Potassium: 4.2 mmol/L (ref 3.5–5.3)
Sodium: 138 mmol/L (ref 135–146)
Total Bilirubin: 0.5 mg/dL (ref 0.2–1.2)
Total Protein: 6.4 g/dL (ref 6.1–8.1)
eGFR: 72 mL/min/1.73m2 (ref >=60)

## 2024-02-20 LAB — CBC WITH DIFFERENTIAL/PLATELET
Absolute Lymphocytes: 680 {cells}/uL — ABNORMAL LOW (ref 850–3900)
Absolute Monocytes: 626 {cells}/uL (ref 200–950)
Basophils Absolute: 65 {cells}/uL (ref 0–200)
Basophils Relative: 0.6 %
Eosinophils Absolute: 43 {cells}/uL (ref 15–500)
Eosinophils Relative: 0.4 %
HCT: 35.8 % (ref 35.0–45.0)
Hemoglobin: 12.2 g/dL (ref 11.7–15.5)
MCH: 32.5 pg (ref 27.0–33.0)
MCHC: 34.1 g/dL (ref 32.0–36.0)
MCV: 95.5 fL (ref 80.0–100.0)
MPV: 11.8 fL (ref 7.5–12.5)
Monocytes Relative: 5.8 %
Neutro Abs: 9385 {cells}/uL — ABNORMAL HIGH (ref 1500–7800)
Neutrophils Relative %: 86.9 %
Platelets: 287 Thousand/uL (ref 140–400)
RBC: 3.75 Million/uL — ABNORMAL LOW (ref 3.80–5.10)
RDW: 13.3 % (ref 11.0–15.0)
Total Lymphocyte: 6.3 %
WBC: 10.8 Thousand/uL (ref 3.8–10.8)

## 2024-02-20 LAB — SEDIMENTATION RATE: Sed Rate: 11 mm/h (ref 0–30)

## 2024-02-20 LAB — C-REACTIVE PROTEIN: CRP: <3 mg/L (ref <8.0)

## 2024-03-04 ENCOUNTER — Other Ambulatory Visit: Payer: Self-pay

## 2024-03-06 ENCOUNTER — Other Ambulatory Visit: Payer: Self-pay

## 2024-03-06 NOTE — Progress Notes (Signed)
 Specialty Pharmacy Refill Coordination Note  Vanessa Harmon is a 73 y.o. female contacted today regarding refills of specialty medication(s) Etanercept  (Enbrel  SureClick)   Patient requested (Patient-Rptd) Delivery   Delivery date: 03/08/24   Verified address: (Patient-Rptd) [address] [place], [place] [address]   Medication will be filled on: 03/07/24

## 2024-03-11 ENCOUNTER — Other Ambulatory Visit: Payer: Self-pay | Admitting: Physician Assistant

## 2024-03-12 ENCOUNTER — Other Ambulatory Visit: Payer: Self-pay | Admitting: Physician Assistant

## 2024-03-12 ENCOUNTER — Telehealth: Payer: Self-pay | Admitting: Internal Medicine

## 2024-03-12 MED ORDER — AMLODIPINE BESYLATE 10 MG PO TABS: 10.0000 mg | ORAL_TABLET | Freq: Every day | ORAL | 1 refills | Status: AC

## 2024-03-12 MED ORDER — NEXLETOL 180 MG PO TABS: 180.0000 mg | ORAL_TABLET | Freq: Every day | ORAL | 3 refills | Status: AC

## 2024-03-12 MED ORDER — TRAZODONE HCL 150 MG PO TABS: 150.0000 mg | ORAL_TABLET | Freq: Every day | ORAL | 0 refills | Status: AC

## 2024-03-12 NOTE — Telephone Encounter (Signed)
*  STAT* If patient is at the pharmacy, call can be transferred to refill team.   1. Which medications need to be refilled? (please list name of each medication and dose if known)   Bempedoic Acid  (NEXLETOL ) 180 MG TABS    2. Which pharmacy/location (including street and city if local pharmacy) is medication to be sent to?  [facility] - [place], [place] - [address]    3. Do they need a 30 day or 90 day supply? 90  Patient has appt tomorrow

## 2024-03-12 NOTE — Telephone Encounter (Signed)
 Refill sent.

## 2024-03-12 NOTE — Telephone Encounter (Signed)
 Copied from CRM [id number]. Topic: Clinical - Medication Refill >> Mar 12, 2024 12:19 PM Thersia C wrote: Medication: traZODone  (DESYREL ) 150 MG tablet  amLODipine  (NORVASC ) 10 MG tablet  Has the patient contacted their pharmacy? Yes (Agent: If no, request that the patient contact the pharmacy for the refill. If patient does not wish to contact the pharmacy document the reason why and proceed with request.) (Agent: If yes, when and what did the pharmacy advise?)  This is the patient's preferred pharmacy:  [facility] - [place], [place] - [address] [facility] [address] [place] [address] Phone: [phone] Fax: [phone]   Is this the correct pharmacy for this prescription? Yes If no, delete pharmacy and type the correct one.   Has the prescription been filled recently? No  Is the patient out of the medication? Yes  Has the patient been seen for an appointment in the last year OR does the patient have an upcoming appointment? Yes  Can we respond through MyChart? Yes  Agent: Please be advised that Rx refills may take up to 3 business days. We ask that you follow-up with your pharmacy.

## 2024-03-13 ENCOUNTER — Ambulatory Visit: Attending: Internal Medicine | Admitting: Internal Medicine

## 2024-03-13 ENCOUNTER — Encounter: Payer: Self-pay | Admitting: Internal Medicine

## 2024-03-13 VITALS — BP 148/62 | HR 65 | Ht 66.0 in

## 2024-03-13 DIAGNOSIS — T466X5S Adverse effect of antihyperlipidemic and antiarteriosclerotic drugs, sequela: Secondary | ICD-10-CM | POA: Diagnosis not present

## 2024-03-13 DIAGNOSIS — E7849 Other hyperlipidemia: Secondary | ICD-10-CM | POA: Diagnosis not present

## 2024-03-13 DIAGNOSIS — M791 Myalgia, unspecified site: Secondary | ICD-10-CM

## 2024-03-13 DIAGNOSIS — I10 Essential (primary) hypertension: Secondary | ICD-10-CM

## 2024-03-13 NOTE — Patient Instructions (Signed)
 Medication Instructions:  NO CHANGES  *If you need a refill on your cardiac medications before your next appointment, please call your pharmacy*  Lab Work: FASTING lab work in 1 year -- before next visit  If you have labs (blood work) drawn today and your tests are completely normal, you will receive your results only by: MyChart Message (if you have MyChart) OR A paper copy in the mail If you have any lab test that is abnormal or we need to change your treatment, we will call you to review the results.  Follow-Up: At [facility], you and your health needs are our priority.  As part of our continuing mission to provide you with exceptional heart care, our providers are all part of one team.  This team includes your primary Cardiologist (physician) and Advanced Practice Providers or APPs (Physician Assistants and Nurse Practitioners) who all work together to provide you with the care you need, when you need it.  Your next appointment:    12 months with Rosaline Bane NP -- lipid clinic.  [address] [place], [place] [address]   We recommend signing up for the patient portal called MyChart.  Sign up information is provided on this After Visit Summary.  MyChart is used to connect with patients for Virtual Visits (Telemedicine).  Patients are able to view lab/test results, encounter notes, upcoming appointments, etc.  Non-urgent messages can be sent to your provider as well.   To learn more about what you can do with MyChart, go to [email or web address].

## 2024-03-13 NOTE — Progress Notes (Signed)
 Vanessa Harmon                                          MRN: [id number]   03/13/2024   The VBCI Quality Team Specialist reviewed this patient medical record for the purposes of chart review for care gap closure. The following were reviewed: abstraction for care gap closure-glycemic status assessment. KED closed in [facility] portal.    VBCI Quality Team

## 2024-03-13 NOTE — Progress Notes (Signed)
 LIPID CLINIC CONSULT NOTE  Chief Complaint:  Follow-up dyslipidemia  Primary Care Physician: Job Lukes, PA  Primary Cardiologist:  None  HPI:  Vanessa Harmon is a 73 y.o. female who is being seen today for the evaluation of dyslipidemia at the request of Job Lukes, [place].  This is a pleasant 73 year old female kindly referred for evaluation and management of dyslipidemia.  She has a history of longstanding dyslipidemia previously on atorvastatin  however developed significant myalgias and had to discontinue the medicine.  Her LDL cholesterol had been much better controlled around 100 however recent labs in April off of medication showed total cholesterol 305, HDL 52, triglycerides 149 and LDL 223.  These findings are highly suggestive of a genetic dyslipidemia, probably familial hyperlipidemia.  She notes her mom had a stroke at early age (<50) and also had high cholesterol and she also had a brother with high cholesterol (unclear if greater than 190).  Based on the Radioshack criteria, this would be considered probable familial hyperlipidemia.  Unfortunately, she also has statin intolerance, in addition was tried on rosuvastatin after the atorvastatin  and could not tolerate it due to myalgias.  She was placed on ezetimibe .  05/10/2021  Vanessa Harmon is seen today in follow-up.  Cholesterol has improved significantly with PCSK9 inhibitor therapy.  Total now 208, triglycerides 215, HDL 57 and LDL 114 (down from 223 previously).  Despite this, however, ideally would like to see her cholesterol lower.  We did get an LP(a) which was low at 33.4.  Her LDL particle numbers still elevated at 1321.  We discussed this finding and possible options for additional therapy.  10/15/2021  Vanessa Harmon returns today for follow-up.  She has had significant additional lipid lowering.  Her LDL particle numbers are now quite low at 377, LDL-C is 48, total cholesterol 137, triglycerides are stable  at 213 and small LDL particle number is low at 136.  She is tolerating the current combination of Repatha , Nexletol  and Zetia  well.  We talked about combining the Nexletol  and Zetia  into 1 pill but she feels that she is doing okay with her current pill burden.  03/13/2024  Vanessa Harmon is seen today in follow-up.  She has a lot of medical issues going on now.  She had dental work today and is in some discomfort.  She also is in a right walking boot.  From a cardiac standpoint she denies any chest pain or shortness of breath.  She is still on combination therapy Repatha , Nexletol  and ezetimibe  and has had very good cholesterol control.  Her last LDL particle number was 304 with an LDL 40 and triglycerides to come down to 170.  PMHx:  Past Medical History:  Diagnosis Date   Adenomatous polyp    Allergy    Anxiety    takes Ativan  daily as needed   Arthritis    inflammatory arthritis (Dr. RONAL Ludie Piety)   Asthma    Cataract    Chronic back pain    scoliosis    Chronic kidney disease 11/07/2023   Constipation    takes miralax  every other day   COVID 05/19/2022   Depression    takes Lexapro  daily   Eczema    Essential hypertension, benign    takes Hyzaar and Metoprolol  daily   Family history of adverse reaction to anesthesia    Sister PONV   Fibromyalgia    GERD (gastroesophageal reflux disease)    History of colon polyps  History of shingles    Hyperlipidemia    takes Atorvastatin  daily   Insomnia    takes Melatonin nightly   Joint pain    Osteoarthritis    Osteoporosis    PSVT (paroxysmal supraventricular tachycardia)    Recurrent upper respiratory infection (URI)    Rheumatoid arthritis (HCC)    sees Dr. Mai; stable on Orencia    Rhinitis, allergic    uses Flonase  daily   Urinary urgency    Urticaria     Past Surgical History:  Procedure Laterality Date   ADENOIDECTOMY     APPENDECTOMY     BACK SURGERY  2012   at age 46 d/t scoliosis   BLEPHAROPLASTY   2018   BREAST BIOPSY     cataract surgery     COLONOSCOPY     EYE SURGERY     FOOT SURGERY Right 01/23/2024   FRACTURE SURGERY     HARDWARE REMOVAL Left 02/24/2020   Procedure: HARDWARE REMOVAL LEFT DISTAL RADIUS;  Surgeon: Murrell Drivers, MD;  Location: [facility];  Service: Orthopedics;  Laterality: Left;   INCISION AND DRAINAGE OF WOUND Left 08/16/2023   Procedure: IRRIGATION AND DEBRIDEMENT WOUND;  Surgeon: Romona Harari, MD;  Location: [facility];  Service: Orthopedics;  Laterality: Left;   JOINT REPLACEMENT     KNEE ARTHROSCOPY Bilateral    neuroma removed from foot     unsure of which foot   OPEN REDUCTION INTERNAL FIXATION (ORIF) DISTAL RADIAL FRACTURE Left 08/15/2019   Procedure: OPEN REDUCTION INTERNAL FIXATION (ORIF) DISTAL RADIAL FRACTURE;  Surgeon: Murrell Drivers, MD;  Location: [facility];  Service: Orthopedics;  Laterality: Left;  block in preop   POSTERIOR CERVICAL FUSION/FORAMINOTOMY N/A 05/29/2020   Procedure: Removal of hardware, Replacement of thoracic 8, thoracic 9, thoracic 10 screws, Right Thoracic 6-7, Right Thoracic 7-8 Laminectomies with thoracic hooks from Thoracic 4-5 to Thoracic 10;  Surgeon: Unice Pac, MD;  Location: [facility] OR;  Service: Neurosurgery;  Laterality: N/A;   POSTERIOR LUMBAR FUSION 4 LEVEL N/A 02/15/2018   Procedure: Decompression and fusion Thoracic nine to Lumbar two with exploration of previous fusion;  Surgeon: Unice Pac, MD;  Location: [facility] OR;  Service: Neurosurgery;  Laterality: N/A;   SHOULDER SURGERY Right    SPINE SURGERY     TONSILLECTOMY     TOTAL HIP ARTHROPLASTY Right 04/15/2014   Procedure: RIGHT TOTAL HIP ARTHROPLASTY ANTERIOR APPROACH;  Surgeon: Evalene JONETTA Chancy, MD;  Location: [facility] OR;  Service: Orthopedics;  Laterality: Right;   TOTAL KNEE ARTHROPLASTY Left 11/26/2019   Procedure: TOTAL KNEE ARTHROPLASTY;  Surgeon: Ernie Cough, MD;  Location: [facility];  Service: Orthopedics;   Laterality: Left;  70 mins   VITRECTOMY Left 06/28/2023   VITRECTOMY Right 08/09/2023   WOUND EXPLORATION Left 07/15/2023   Procedure: WOUND EXPLORATION;  Surgeon: Romona Harari, MD;  Location: [facility] OR;  Service: Orthopedics;  Laterality: Left;  ARM    FAMHx:  Family History  Problem Relation Age of Onset   Atrial fibrillation Mother    Stroke Mother    Allergic rhinitis Mother    Hypertension Mother    Miscarriages / Stillbirths Mother    Allergic rhinitis Father    AAA (abdominal aortic aneurysm) Father    COPD Father    Prostate cancer Father    Cancer Father    Depression Father    Hearing loss Father    Atrial fibrillation Brother        ablation  Stomach cancer Paternal Grandmother        some sort of abdominal cancer   Hypertension Other        unspecified grandmother   AAA (abdominal aortic aneurysm) Other        unspecified grandfather   Cancer Other        unspecified grandfather   Neuropathy Neg Hx    Colon cancer Neg Hx    Esophageal cancer Neg Hx    Rectal cancer Neg Hx     SOCHx:   reports that she quit smoking about 27 years ago. Her smoking use included cigarettes. She started smoking about 47 years ago. She has a 20 pack-year smoking history. She has never been exposed to tobacco smoke. She has never used smokeless tobacco. She reports that she does not currently use alcohol . She reports that she does not use drugs.  ALLERGIES:  Allergies  Allergen Reactions   Advair Hfa [Fluticasone -Salmeterol] Other (See Comments)    Hoarness   Statins Other (See Comments)   Breo Ellipta  [Fluticasone  Furoate-Vilanterol] Other (See Comments)    Severe hoarseness    Fosamax [Alendronate Sodium] Other (See Comments)    myalgias   Infliximab Other (See Comments)    Skin reaction   Spiriva  Handihaler [Tiotropium Bromide ]     UNSPECIFIED REACTION    Verapamil    Tape Rash    PAPER TAPE: Causes severe rash and weeping    ROS: Pertinent items noted in HPI and  remainder of comprehensive ROS otherwise negative.  HOME MEDS: Current Outpatient Medications on File Prior to Visit  Medication Sig Dispense Refill   albuterol  (PROAIR  HFA) 108 (90 Base) MCG/ACT inhaler Inhale two puffs every 4-6 hours if needed for cough or wheeze. 8.5 Inhaler 1   amLODipine  (NORVASC ) 10 MG tablet Take 1 tablet (10 mg total) by mouth daily. 90 tablet 1   aspirin  EC 81 MG tablet Take 1 tablet (81 mg total) by mouth daily. Swallow whole. 30 tablet 11   azelastine  (ASTELIN ) 0.1 % nasal spray Place 1 spray into both nostrils 2 (two) times daily. Use in each nostril as directed     azithromycin (ZITHROMAX) 250 MG tablet Take by mouth. 3x a week for inflammation     Bempedoic Acid  (NEXLETOL ) 180 MG TABS Take 1 tablet (180 mg total) by mouth daily. 90 tablet 3   budesonide  (PULMICORT ) 0.5 MG/2ML nebulizer solution Take by nebulization.     Calcium  Carb-Cholecalciferol  (CALCIUM  600/VITAMIN D3 PO) Take 1 tablet by mouth daily.     Carboxymethylcellulose Sod PF (REFRESH PLUS) 0.5 % SOLN as needed.     Cetirizine HCl (ZYRTEC ALLERGY) 10 MG CAPS      denosumab  (PROLIA ) 60 MG/ML SOSY injection Inject 60 mg into the skin every 6 (six) months. Courier to rheum: [address], [address], [place] [address]. Appt on 11/27/2023 1 mL 0   diphenhydrAMINE  (BENADRYL ) 25 MG tablet Take 50 mg by mouth daily as needed for allergies.     docusate sodium  (COLACE) 100 MG capsule 100 mg 2 (two) times daily.     escitalopram  (LEXAPRO ) 20 MG tablet TAKE 1 TABLET (20 MG TOTAL) BY MOUTH DAILY 90 tablet 1   etanercept  (ENBREL  SURECLICK) 50 MG/ML injection Inject 50 mg into the skin once a week. 4 mL 2   Evolocumab  (REPATHA  SURECLICK) 140 MG/ML SOAJ Inject 140 mg into the skin every 14 (fourteen) days. 6 mL 3   ezetimibe  (ZETIA ) 10 MG tablet TAKE ONE TABLET  BY MOUTH DAILY 90 tablet 0   famotidine  (PEPCID ) 40 MG tablet TAKE 1 TABLET (40 MG TOTAL) BY MOUTH AT BEDTIME. 30 tablet 5   furosemide  (LASIX ) 20 MG  tablet TAKE 1 TABLET (20 MG TOTAL) BY MOUTH DAILY. 90 tablet 0   gabapentin  (NEURONTIN ) 300 MG capsule Take 1 capsule (300 mg total) by mouth at bedtime. 90 capsule 2   HYDROcodone -acetaminophen  (NORCO) 10-325 MG tablet 1 tablet every 6 (six) hours as needed.     indomethacin  (INDOCIN  SR) 75 MG CR capsule TAKE ONE CAPSULE BY MOUTH DAILY WITH BREAKFAST 90 capsule 0   ipratropium (ATROVENT ) 0.03 % nasal spray Place 1 spray into both nostrils 2 (two) times daily as needed for rhinitis.     irbesartan  (AVAPRO ) 300 MG tablet Take 1 tablet (300 mg total) by mouth daily. 90 tablet 1   Lifitegrast  (XIIDRA ) 5 % SOLN Place 1 drop into both eyes in the morning and at bedtime.      melatonin 3 MG TABS tablet Take by mouth.     methocarbamol  (ROBAXIN ) 500 MG tablet Take 1 tablet (500 mg total) by mouth every 6 (six) hours as needed for muscle spasms. 60 tablet 2   mometasone -formoterol  (DULERA) 100-5 MCG/ACT AERO Inhale 2 puffs into the lungs 2 (two) times daily.     montelukast  (SINGULAIR ) 10 MG tablet Take 1 tablet (10 mg total) by mouth at bedtime. 30 tablet 5   Multiple Vitamin (MULTIVITAMIN) capsule Take 1 capsule by mouth daily.     omeprazole  (PRILOSEC) 40 MG capsule Take 1 capsule (40 mg total) by mouth daily. TAKE ONE CAPSULE BY MOUTH DAILY BEFORE BREAKFAST DAILY 90 capsule 3   OVER THE COUNTER MEDICATION Place 1 application into both eyes at bedtime. Genteal overnight eye ointment     polyethylene glycol powder (GLYCOLAX /MIRALAX ) 17 GM/SCOOP powder Miralax  17 gram/dose oral powder     Povidone (IVIZIA DRY EYES OP) Apply to eye.     predniSONE  (DELTASONE ) 5 MG tablet Take 1 tablet (5 mg total) by mouth daily with breakfast. 90 tablet 0   PRESCRIPTION MEDICATION Place 1 spray into the nose in the morning and at bedtime. Budesonide  0.5 mg / 2 mL in the morning Nasal rinse, 1.0mg /19mL in the evening.     pseudoephedrine  (SUDAFED) 30 MG tablet Take 30 mg by mouth every 4 (four) hours as needed for  congestion.     traZODone  (DESYREL ) 150 MG tablet Take 1 tablet (150 mg total) by mouth daily. 90 tablet 0   valACYclovir  (VALTREX ) 1000 MG tablet Take 1,000 mg by mouth daily.      vitamin C  (ASCORBIC ACID ) 500 MG tablet Take 500 mg by mouth daily.     Current Facility-Administered Medications on File Prior to Visit  Medication Dose Route Frequency Provider Last Rate Last Admin   [START ON 05/26/2024] denosumab  (PROLIA ) injection 60 mg  60 mg Subcutaneous Q6 months         LABS/IMAGING: No results found for this or any previous visit (from the past 48 hours). No results found.  LIPID PANEL:    Component Value Date/Time   CHOL 123 05/11/2023 1143   TRIG 238.0 (H) 05/11/2023 1143   HDL 48.10 05/11/2023 1143   CHOLHDL 3 05/11/2023 1143   VLDL 47.6 (H) 05/11/2023 1143   LDLCALC 28 05/11/2023 1143   LDLCALC 33 03/16/2022 1517    WEIGHTS: Wt Readings from Last 3 Encounters:  11/15/23 179 lb 9.6 oz (81.5 kg)  11/07/23  178 lb 6.1 oz (80.9 kg)  08/16/23 179 lb 14.3 oz (81.6 kg)    VITALS: BP (!) 148/62   Pulse 65   Ht 5' 6 (1.676 m)   SpO2 97%   BMI 28.99 kg/m   EXAM: Deferred  EKG: N/A  ASSESSMENT: Probable familial hyperlipidemia, LDL greater than 190 (by Ray Finical criteria) Family history of premature stroke in her mother (earlier than age 44) Marked dyslipidemia in her mother and brother Statin intolerance-myalgias, on ezetimibe  Rheumatoid arthritis  PLAN: 1.   Vanessa Harmon continues to have very good control of her cholesterol.  She is on combination therapy with Repatha , Nexletol  and ezetimibe  and has an LDL of 40 with a low number of LDL particles and near normal triglycerides.  I recommend continuing with her current therapies.  Hopefully once she is out of her walking boot she will be able to exercise more.  Will plan follow-up with our prevention APP and repeat lipids in 1 year or sooner as necessary.  Vinie KYM Maxcy, MD, [facility], FNLA, FACP  [place]   [facility]  Medical Director of the Advanced Lipid Disorders &  Cardiovascular Risk Reduction Clinic Diplomate of the American Board of Clinical Lipidology Attending Cardiologist  Direct Dial: [phone]  Fax: [phone]  Website:  [email or web address] Vinie JAYSON Maxcy 03/13/2024, 2:37 PM

## 2024-03-27 ENCOUNTER — Other Ambulatory Visit: Payer: Self-pay

## 2024-03-28 ENCOUNTER — Other Ambulatory Visit: Payer: Self-pay | Admitting: Physician Assistant

## 2024-03-29 ENCOUNTER — Other Ambulatory Visit (HOSPITAL_COMMUNITY): Payer: Self-pay

## 2024-03-29 ENCOUNTER — Other Ambulatory Visit: Payer: Self-pay

## 2024-03-29 NOTE — Progress Notes (Signed)
 Specialty Pharmacy Refill Coordination Note  MyChart Questionnaire Submission  Vanessa Harmon is a 73 y.o. female contacted today regarding refills of specialty medication(s) Enbrel .  Doses on hand: 2 for 12/15 & 12/22  Next inj: 04/15/24  Patient requested: (Patient-Rptd) Delivery   Delivery date: 04/10/24  Verified address: [address] [place] [address]  Medication will be filled on 04/09/24

## 2024-04-01 ENCOUNTER — Other Ambulatory Visit: Payer: Self-pay

## 2024-04-08 ENCOUNTER — Other Ambulatory Visit: Payer: Self-pay | Admitting: Physician Assistant

## 2024-04-09 ENCOUNTER — Other Ambulatory Visit: Payer: Self-pay

## 2024-04-16 ENCOUNTER — Other Ambulatory Visit: Payer: Self-pay | Admitting: Physician Assistant

## 2024-04-30 ENCOUNTER — Other Ambulatory Visit: Payer: Self-pay | Admitting: Physician Assistant

## 2024-05-03 ENCOUNTER — Other Ambulatory Visit (HOSPITAL_COMMUNITY): Payer: Self-pay

## 2024-05-03 ENCOUNTER — Other Ambulatory Visit: Payer: Self-pay

## 2024-05-03 ENCOUNTER — Other Ambulatory Visit: Payer: Self-pay | Admitting: Pharmacy Technician

## 2024-05-03 NOTE — Progress Notes (Signed)
 Specialty Pharmacy Refill Coordination Note  Vanessa Harmon is a 74 y.o. female contacted today regarding refills of specialty medication(s) Etanercept  (Enbrel  SureClick)   Patient requested Delivery   Delivery date: 05/08/24   Verified address: [address] [place], [place] [address]   Medication will be filled on: 05/07/24    05/04/24 Patient is aware of copay on Enbrel  SureClick 50mg /ml of $997.03.

## 2024-05-07 ENCOUNTER — Other Ambulatory Visit: Payer: Self-pay

## 2024-05-09 ENCOUNTER — Other Ambulatory Visit: Payer: Self-pay

## 2024-05-10 ENCOUNTER — Encounter: Payer: Self-pay | Admitting: Physician Assistant

## 2024-05-10 ENCOUNTER — Ambulatory Visit: Admitting: Physician Assistant

## 2024-05-10 VITALS — BP 150/90 | HR 76 | Temp 97.5°F | Ht 66.0 in | Wt 179.2 lb

## 2024-05-10 DIAGNOSIS — Z0000 Encounter for general adult medical examination without abnormal findings: Secondary | ICD-10-CM

## 2024-05-10 DIAGNOSIS — M5416 Radiculopathy, lumbar region: Secondary | ICD-10-CM | POA: Diagnosis not present

## 2024-05-10 DIAGNOSIS — I10 Essential (primary) hypertension: Secondary | ICD-10-CM | POA: Diagnosis not present

## 2024-05-10 DIAGNOSIS — M0600 Rheumatoid arthritis without rheumatoid factor, unspecified site: Secondary | ICD-10-CM

## 2024-05-10 LAB — CBC WITH DIFFERENTIAL/PLATELET
Basophils Absolute: 0.1 K/uL (ref 0.0–0.1)
Basophils Relative: 0.6 % (ref 0.0–3.0)
Eosinophils Absolute: 0.1 K/uL (ref 0.0–0.7)
Eosinophils Relative: 1.1 % (ref 0.0–5.0)
HCT: 39.1 % (ref 36.0–46.0)
Hemoglobin: 12.8 g/dL (ref 12.0–15.0)
Lymphocytes Relative: 8 % — ABNORMAL LOW (ref 12.0–46.0)
Lymphs Abs: 0.9 K/uL (ref 0.7–4.0)
MCHC: 32.8 g/dL (ref 30.0–36.0)
MCV: 95.2 fl (ref 78.0–100.0)
Monocytes Absolute: 0.9 K/uL (ref 0.1–1.0)
Monocytes Relative: 7.6 % (ref 3.0–12.0)
Neutro Abs: 9.7 K/uL — ABNORMAL HIGH (ref 1.4–7.7)
Neutrophils Relative %: 82.7 % — ABNORMAL HIGH (ref 43.0–77.0)
Platelets: 307 K/uL (ref 150.0–400.0)
RBC: 4.11 Mil/uL (ref 3.87–5.11)
RDW: 14.1 % (ref 11.5–15.5)
WBC: 11.8 K/uL — ABNORMAL HIGH (ref 4.0–10.5)

## 2024-05-10 LAB — COMPREHENSIVE METABOLIC PANEL WITH GFR
ALT: 17 U/L (ref 3–35)
AST: 18 U/L (ref 5–37)
Albumin: 4.5 g/dL (ref 3.5–5.2)
Alkaline Phosphatase: 41 U/L (ref 39–117)
BUN: 22 mg/dL (ref 6–23)
CO2: 30 meq/L (ref 19–32)
Calcium: 10.2 mg/dL (ref 8.4–10.5)
Chloride: 100 meq/L (ref 96–112)
Creatinine, Ser: 1.01 mg/dL (ref 0.40–1.20)
GFR: 54.65 mL/min — ABNORMAL LOW
Glucose, Bld: 112 mg/dL — ABNORMAL HIGH (ref 70–99)
Potassium: 4.6 meq/L (ref 3.5–5.1)
Sodium: 137 meq/L (ref 135–145)
Total Bilirubin: 0.5 mg/dL (ref 0.2–1.2)
Total Protein: 7 g/dL (ref 6.0–8.3)

## 2024-05-10 LAB — LIPID PANEL
Cholesterol: 127 mg/dL (ref 28–200)
HDL: 59.1 mg/dL
LDL Cholesterol: 35 mg/dL (ref 10–99)
NonHDL: 68
Total CHOL/HDL Ratio: 2
Triglycerides: 163 mg/dL — ABNORMAL HIGH (ref 10.0–149.0)
VLDL: 32.6 mg/dL (ref 0.0–40.0)

## 2024-05-10 LAB — MICROALBUMIN / CREATININE URINE RATIO
Creatinine,U: 43.7 mg/dL
Microalb Creat Ratio: UNDETERMINED mg/g (ref 0.0–30.0)
Microalb, Ur: <0.7 mg/dL

## 2024-05-10 NOTE — Assessment & Plan Note (Addendum)
" °  Orders:   Sedimentation rate   VITAMIN D  25 Hydroxy (Vit-D Deficiency, Fractures)   B12   TSH   Iron, TIBC and Ferritin Panel   Hand/UE Inj: L elbow  "

## 2024-05-10 NOTE — Progress Notes (Unsigned)
 "  Office Visit Note  Patient: Vanessa Harmon             Date of Birth: 1950/08/28           MRN: [id number]             PCP: Job Lukes, PA Referring: Job Lukes, PA Visit Date: 05/21/2024   Subjective:  Pain and Joint Pain (Elbow )   History of Present Illness: Vanessa Harmon is a 74 y.o. female here for follow up for seronegative RA on Enbrel  50 mg weekly, prednisone  5 mg daily, and indomethacin  75 mg daily.  ***  Previous HPI 02/19/2024 Vanessa Harmon is a 74 y.o. female here for follow up for seronegative RA on Enbrel  50 mg weekly, prednisone  5 mg daily, and indomethacin  75 mg daily.  She is following up after foot surgery since last visit.   She underwent foot surgery and is currently in a cast. She is non-weight bearing and has been living in the basement as her wheelchair does not fit through the doorways on the main floor. She anticipates being in the cast for two more weeks.   She recently restarted Enbrel  after a perioperative pause. She experiences swelling and pain in her hands, particularly in the right hand's fourth PIP joint, and some discomfort in her left elbow. Her right shoulder is described as 'bone on bone', and she received an injection a week before surgery, though she is unsure of its efficacy. She cannot lie on her right side due to shoulder pain.   She takes Lasix  daily for fluid management. She experiences leg swelling, which she attributes to venous insufficiency as her valves do not function properly. She has a history of a partially blocked artery in her leg. Recent labs in August showed a stable GFR.         Previous HPI 11/15/2023 Vanessa Harmon is a 74 y.o. female here for follow up for seronegative RA on Enbrel  50 mg weekly, prednisone  5 mg daily, and indomethacin  75 mg daily.     She has a history of chronic kidney disease, with recent glomerular filtration rate (GFR) readings of 47. Previous GFR readings were 50 six months ago,  47 a year ago, and 45 earlier last year.   She is on diclofenac, a nonsteroidal anti-inflammatory drug (NSAID), for arthritis, which can potentially affect kidney function. She is not on maximal doses of NSAIDs. She is also on azithromycin three times a week as an anti-inflammatory. No recent infections or need for antibiotics.   She has ongoing issues with her foot, suspecting another stress fracture and experiencing significant pain in her toes. She mentions a sagging joint and a previous discussion about a potential CT scan and joint fusion, which she is hesitant to pursue due to past experiences with multiple back fusions.   She reports persistent pain in her elbow, consistent with previous episodes of epicondylitis. The pain is described as 'the same old, same old,' with soreness primarily on the medial side. She has received injections for this in the past, with the last one administered in April with Dr. Romona.   Recent lab work, including blood count and metabolic panel, was reviewed. Her hemoglobin is slightly below the normal range at 11.5, but this is not a new finding. Her absolute neutrophil count is normal, and there are no concerning changes in her blood work.       08/14/2023 Vanessa Harmon is a 74 y.o.  female here for follow up for seronegative RA on Enbrel  50 mg weekly, prednisone  5 mg daily, and indomethacin  75 mg daily.    She is scheduled for a repeat surgery on Wednesday to address a dog bite wound on her left forearm that requires debridement. The initial injury involved significant bleeding due to a vessel tear, necessitating surgical intervention. She notes that the skin on her arm is thin, which may complicate suturing.   She is currently on Enbrel  for arthritis, and there is consideration about holding the medication before and after the procedure due to concerns about wound healing. She is also taking indomethacin  for pain management but plans to skip it on the day of  the surgery to avoid increased bleeding risk.   Her arthritis is generally well-managed, although she experiences stiffness in her hands, attributed to osteoarthritis. No recent illnesses or infections are reported.   Also had latest prolia  injection 05/23/23 for osteoporosis management. This was uneventful. No new falls.     05/16/2023 Vanessa Harmon is a 74 y.o. female here for follow up for seronegative RA on Enbrel  50 mg weekly, prednisone  5 mg daily, and indomethacin  75 mg daily.   They have been managing their rheumatoid arthritis with Enbrel , which they have been able to obtain on time without any gaps in administration. Over the past month, they have noticed significant improvement in their symptoms, with no swelling in their hands and resolution of pain in their knuckles. Previously, they had an injection for their elbow, but since starting Enbrel , their elbow has not been problematic. They also had their shoulders injected a couple of weeks ago, which has been helpful in managing their symptoms.   They are currently taking 5 mg of prednisone  and indomethacin  once a day. Since switching to a citrate-free formulation of Enbrel , they have not experienced any more rashes or injection site irritation.   They had a cold in early January, which resolved without the need for antibiotics. The last time they experienced cold or upper respiratory symptoms was in June of the previous year. No recent upper respiratory infections aside from the cold in January.      02/15/2023 Vanessa Harmon is a 74 y.o. female here for follow up for seronegative RA on prednisone  5 mg daily and indomethacin  75 mg daily and started Enbrel  in August.  So far she is not certain about the amount of difference with addition of the Enbrel .  Is not experiencing any injection related reactions.  She has had some additional viral infections that she is not sure if they are related to medicine or incidental.  She had bilateral  shoulder steroid injections with Dr. Melvenia pain is getting worse again in these areas.  Also feels increase in left elbow pain that is similar to the episode of epicondylitis which we saw in March. She had called in about perioperative management of Enbrel  for surgery for toe joint instability.  This was delayed because her husband had a dog hand bite with skin tearing around the same time. Also had mildly low hemoglobin on routine labs with her primary care PA.  Subsequent fecal occult stool card was positive for blood and so was referred to gastroenterology.  Has not had colonoscopy in years when she did there were no concerning findings.  She has a history of previous internal hemorrhoids.  She is still on the indomethacin .   Previous HPI 11/15/2022 CAELYNN MARSHMAN is a 74 y.o. female here for follow  up for seronegative RA on prednisone  5 mg daily and indomethacin  75 mg daily.  She stopped taking Rinvoq  since about 1 month ago as she felt this was worsening her frequency of upper respiratory infection and needing inhaled bronchodilator and breathing treatments.  Was also having increased symptoms during this time due to seasonal allergies.  Since she stopped the Rinvoq  feels this is improved although today is a bad day with persistent coughing.  Noticed small increase in joint pain of several areas including her shoulders and hips and knee.  She had repeat corticosteroid injection in both shoulders which has been improving her symptoms range of motion is better still pain with use.  Persistent bruising and skin rashes with a new avulsion on the left forearm but about the same as usual.   Labs reviewed 10/2022 WBC 11.9 Hgb 11.8 eGFR 47.27   DMARD Hx Methotrexate  - LFT elevations Remicade- Rashes Actemra  - Rashes Orencia  IV - Secondary nonresponder Rinvoq  - Respiratory symptoms worse     Previous HPI 09/21/2022 KAYLIA WINBORNE is a 74 y.o. female here for follow up here for follow-up of  seronegative RA on Rinvoq  15 mg p.o. daily and prednisone  5 mg daily.  The left elbow pain discussed at her last visit improved after steroid injection and has continued to do well.  Joint pain and inflammation doing pretty well overall chronic back pain is her biggest issue.  Continues having easy bruising and skin tears frequently from her 90 pound Rottweiler.  Recently sick with sinus infection for the past week is currently on amoxicillin  for this.  Not associated with any fevers lymphadenopathy or other constitutional symptoms.   Previous HPI 07/13/22 ODESSER TOURANGEAU is a 74 y.o. female here for follow up for seronegative RA on rinvoq  15 mg PO daily and prednisone  5 mg daily.  Since her last visit she finished getting over the persistent sinus infection symptoms after recent COVID illness.  However once the symptoms cleared up she started noticing increase in hip bursitis pain.  Also started getting worsening pain along the medial side of the left elbow.  Most noticeable when using her hands to help her self push off from a seated position due to her hip pain.  Not noticed any visible swelling or erythema she still has bruising around the elbow but that is more chronic and there are multiple skin tears in the area as well.  Does not feel like she is having flareup of increased joint symptoms outside of the hip and elbow pains.  She previously tried use of the elbow brace which we discussed before for her medial epicondylitis but just gets irritation around the skin and elbow does not feel much benefit.   Previous HPI 06/16/22 IFE VITELLI is a 74 y.o. female here for follow up for seronegative RA on prednisone  5 mg daily and rinvoq  15 mg daily started since 2/18. Also prolia  for osteoporosis last injection on 1/24 without complication. Main issue has been sickness with COVID positive test 2/8 and persistent symptoms. She had subsequent sinus infection treated with augmentin  starting 2/21 and had  repeat labs checked yesterday. Brain fog, fatigue worse during COVID infection and partially improved since. She thinks rinvoq  is starting to help with swelling and pain in hands and wrists but still significant overall symptoms. Has some skin tears and bruising on arms from her dog.   Previous HPI 03/16/22 ALEISA HOWK is a 74 y.o. female here for follow up for seropositive  RA on prednisone  5 mg daily. Methotrexate  was discontinued due to worsening laboratory function tests and Actemra  stopped due to developing palpable rashes throughout her legs similar to previous medication reaction to infliximab. She is also noticed easier bruising than usual and has seen discoloration of her nails with some separation and cracking.  Prednisone  is helping somewhat but still has ongoing joint pain and stiffness specially off of her previous medications.  In particular bothering her lately has been persistent pain along the medial side of her left elbow does not recall any specific injury to this area but does feel like there is a sense of swelling and pressure.   Previous HPI 12/14/21 JEANIE MCCARD is a 74 y.o. female here for follow up for seropositive RA on methotrexate  15 mg subcu weekly prednisone  5 mg daily leucovorin  1 mg daily and after starting Actemra  162 mg [place] q. 14 days.  She feels like arthritis symptoms have been well controlled on her current medications.  No significant flareups.  She still gets swelling in her wrist but no other visible joint swelling.  She has had pretty extensive bruising and easy skin tearing on both arms often from playing with her dog.  Lab tests checked in July did show appears to be a small decrease in estimated GFR.   Previous HPI 09/10/2021 SHANIKQUA ZARZYCKI is a 74 y.o. female here for evaluation of chronic back pain associated with seropositive RA she has been seeing Rosaline Salt on treatment with orencia  IV, MTX 15 mg  weekly, leucovorin  1 mg daily, and prednisone   5 mg daily.  She was originally diagnosed with inflammatory arthritis by Dr. Lenon about a decade ago.  Apparently the worst initial symptom at that time was left hip pain and inflammation with concerning findings on MRI.  Was started on methotrexate  as a steroid sparing DMARD treatment.  Apparently had initial concern for seronegative rheumatoid arthritis versus PMR presentation.  She did not tolerate methotrexate  well initially worst side effect was substantial hair loss.  Also did not tolerate well on leflunomide.  She was started on infliximab treatment developed a significant adverse reaction with new skin rashes in multiple areas and severe finger and toe nail changes that lasted about a year after discontinuing the medication.  She was started on IV Orencia  monthly infusions which has been beneficial for symptoms but still notices some amount ongoing and that symptoms tend to worsen between each dosing interval.  She was restarted on methotrexate  this time with leucovorin  in the past few months and this has improved her joint inflammation further.  She remains on low-dose prednisone  5 mg daily throughout most of this time.  Most recent definite flareup of her rheumatoid arthritis was about 6 months ago. In addition to inflammatory joint disease she has significant amount of generalized osteoarthritis.  She reports bone-on-bone degenerative changes with slightly impaired range of motion in the left shoulder.  She had previous left knee total arthroplasty.  She has degenerative arthritis at multiple levels in the spine with history of repeat lumbar spine surgery.  She has some spinal and foraminal stenosis with radicular symptoms.  She is also had recurrent problems with hip bursitis has been responsive to local steroid injection treatments with Dr. Ernie.  She takes gabapentin  300 mg at night and as needed Robaxin  500 mg for the ongoing symptoms. She has not had any history with recurrent infections or  major side effects on her current regimen.  She does have extensive  bruising mostly worst in the past year and worst on extensor surfaces of arms and legs.  She denies any history of abnormal bleeding and blood clots. She has chronic dry eyes and sees Dr. Octavia currently uses xiidra  and genteal drops for this.   Review of Systems  Constitutional:  Positive for fatigue.  HENT:  Negative for mouth sores and mouth dryness.   Eyes:  Positive for dryness.  Respiratory:  Negative for shortness of breath.   Cardiovascular:  Negative for chest pain and palpitations.  Gastrointestinal:  Positive for constipation. Negative for blood in stool and diarrhea.  Endocrine: Negative for increased urination.  Genitourinary:  Positive for involuntary urination.  Musculoskeletal:  Positive for joint pain, gait problem, joint pain, myalgias, morning stiffness and myalgias. Negative for joint swelling, muscle weakness and muscle tenderness.  Skin:  Positive for color change and sensitivity to sunlight. Negative for rash and hair loss.  Allergic/Immunologic: Negative for susceptible to infections.  Neurological:  Negative for dizziness and headaches.  Hematological:  Negative for swollen glands.  Psychiatric/Behavioral:  Negative for depressed mood and sleep disturbance. The patient is not nervous/anxious.     PMFS History:  Patient Active Problem List   Diagnosis Date Noted   Other fatigue 05/21/2024   Abnormal laboratory test result 05/21/2024   Vitamin D  deficiency 05/21/2024   Vitamin B12 deficiency 05/21/2024   Medial epicondylitis of left elbow 03/16/2022   High risk medication use 09/10/2021   Posterior vitreous detachment of left eye 09/29/2020   Thoracic spondylosis with cord compression 05/29/2020   Heterotopic ossification 03/30/2020   Loosening of hardware in spine 01/27/2020   Posterior vitreous detachment of right eye 01/21/2020   Pseudophakia 01/21/2020   Horseshoe retinal tear of right  eye 01/21/2020   Body mass index (BMI) 28.0-28.9, adult 11/27/2019   Left knee OA 11/26/2019   Numbness of foot 02/07/2019   Seronegative rheumatoid arthritis (HCC) 01/25/2019   Peripheral polyneuropathy 01/14/2019   Spinal stenosis, thoracolumbar region 01/14/2019   Thoracogenic scoliosis, thoracolumbar region 01/14/2019   Idiopathic scoliosis of thoracolumbar region 02/15/2018   Rhinitis, chronic 05/16/2017   Moderate persistent asthma without complication 05/04/2017   History of polymyalgia rheumatica 02/15/2015   DJD (degenerative joint disease) 04/15/2014   Lumbar spine scoliosis 11/28/2013   Degenerative spondylolisthesis 10/09/2013   Lumbar radiculopathy 09/16/2013   Lumbosacral radiculitis 07/31/2013   Scoliosis, or kyphoscoliosis, idiopathic 07/31/2013   Lumbar back pain 12/19/2011   Hyperlipidemia 10/16/2010   Depression 10/16/2010   Lumbar spondylosis 10/16/2010   Gastroesophageal reflux disease 10/16/2010   Adenomatous polyp of colon 10/16/2010   History of paroxysmal supraventricular tachycardia 10/16/2010   Essential (primary) hypertension 10/16/2010    Past Medical History:  Diagnosis Date   Adenomatous polyp    Allergy    Anxiety    takes Ativan  daily as needed   Arthritis    inflammatory arthritis (Dr. RONAL Ludie Piety)   Asthma    Cataract    Chronic back pain    scoliosis    Chronic kidney disease 11/07/2023   Constipation    takes miralax  every other day   COVID 05/19/2022   Depression    takes Lexapro  daily   Eczema    Essential hypertension, benign    takes Hyzaar and Metoprolol  daily   Family history of adverse reaction to anesthesia    Sister PONV   Fibromyalgia    GERD (gastroesophageal reflux disease)    History of colon polyps    History  of shingles    Hyperlipidemia    takes Atorvastatin  daily   Insomnia    takes Melatonin nightly   Joint pain    Osteoarthritis    Osteoporosis    PSVT (paroxysmal supraventricular tachycardia)     Recurrent upper respiratory infection (URI)    Rheumatoid arthritis (HCC)    sees Dr. Mai; stable on Orencia    Rhinitis, allergic    uses Flonase  daily   Urinary urgency    Urticaria     Family History  Problem Relation Age of Onset   Atrial fibrillation Mother    Stroke Mother    Allergic rhinitis Mother    Hypertension Mother    Miscarriages / Stillbirths Mother    Allergic rhinitis Father    AAA (abdominal aortic aneurysm) Father    COPD Father    Prostate cancer Father    Cancer Father    Depression Father    Hearing loss Father    Atrial fibrillation Brother        ablation   Stomach cancer Paternal Grandmother        some sort of abdominal cancer   Hypertension Other        unspecified grandmother   AAA (abdominal aortic aneurysm) Other        unspecified grandfather   Cancer Other        unspecified grandfather   Neuropathy Neg Hx    Colon cancer Neg Hx    Esophageal cancer Neg Hx    Rectal cancer Neg Hx    Past Surgical History:  Procedure Laterality Date   ADENOIDECTOMY     APPENDECTOMY     BACK SURGERY  2012   at age 32 d/t scoliosis   BLEPHAROPLASTY  2018   BREAST BIOPSY     cataract surgery     COLONOSCOPY     EYE SURGERY     FOOT SURGERY Right 01/23/2024   FRACTURE SURGERY     HARDWARE REMOVAL Left 02/24/2020   Procedure: HARDWARE REMOVAL LEFT DISTAL RADIUS;  Surgeon: Murrell Drivers, MD;  Location: [facility];  Service: Orthopedics;  Laterality: Left;   INCISION AND DRAINAGE OF WOUND Left 08/16/2023   Procedure: IRRIGATION AND DEBRIDEMENT WOUND;  Surgeon: Romona Harari, MD;  Location: [facility];  Service: Orthopedics;  Laterality: Left;   JOINT REPLACEMENT     KNEE ARTHROSCOPY Bilateral    neuroma removed from foot     unsure of which foot   OPEN REDUCTION INTERNAL FIXATION (ORIF) DISTAL RADIAL FRACTURE Left 08/15/2019   Procedure: OPEN REDUCTION INTERNAL FIXATION (ORIF) DISTAL RADIAL FRACTURE;  Surgeon:  Murrell Drivers, MD;  Location: [facility];  Service: Orthopedics;  Laterality: Left;  block in preop   POSTERIOR CERVICAL FUSION/FORAMINOTOMY N/A 05/29/2020   Procedure: Removal of hardware, Replacement of thoracic 8, thoracic 9, thoracic 10 screws, Right Thoracic 6-7, Right Thoracic 7-8 Laminectomies with thoracic hooks from Thoracic 4-5 to Thoracic 10;  Surgeon: Unice Pac, MD;  Location: [facility] OR;  Service: Neurosurgery;  Laterality: N/A;   POSTERIOR LUMBAR FUSION 4 LEVEL N/A 02/15/2018   Procedure: Decompression and fusion Thoracic nine to Lumbar two with exploration of previous fusion;  Surgeon: Unice Pac, MD;  Location[facility];  Service: Neurosurgery;  Laterality: N/A;   SHOULDER SURGERY Right    SPINE SURGERY     TONSILLECTOMY     TOTAL HIP ARTHROPLASTY Right 04/15/2014   Procedure: RIGHT TOTAL HIP ARTHROPLASTY ANTERIOR APPROACH;  Surgeon: Evalene  JONETTA Chancy, MD;  Location: [facility] OR;  Service: Orthopedics;  Laterality: Right;   TOTAL KNEE ARTHROPLASTY Left 11/26/2019   Procedure: TOTAL KNEE ARTHROPLASTY;  Surgeon: Ernie Cough, MD;  Location: [facility];  Service: Orthopedics;  Laterality: Left;  70 mins   VITRECTOMY Left 06/28/2023   VITRECTOMY Right 08/09/2023   WOUND EXPLORATION Left 07/15/2023   Procedure: WOUND EXPLORATION;  Surgeon: Romona Harari, MD;  Location: [facility] OR;  Service: Orthopedics;  Laterality: Left;  ARM   Social History   Social History Narrative   Retired engineer, civil (consulting)   No children   Lives with husband   Immunization History  Administered Date(s) Administered   Fluad Quad(high Dose 65+) 02/18/2022   Fluad Trivalent(High Dose 65+) 02/27/2023   INFLUENZA, HIGH DOSE SEASONAL PF 12/13/2018, 02/18/2022, 02/19/2024   Influenza Split 01/31/2012   Influenza Whole 02/16/2010   Influenza,inj,Quad PF,6+ Mos 01/14/2014, 01/27/2015, 02/02/2016   Influenza,inj,quad, With Preservative 02/26/2020   Influenza-Unspecified 02/24/2021   Moderna Covid-19 Fall Seasonal  Vaccine 4yrs & older 02/18/2022, 03/04/2023, 03/21/2023, 02/19/2024   Moderna Sars-Covid-2 Vaccination 02/20/2022   PFIZER(Purple Top)SARS-COV-2 Vaccination 05/25/2019, 06/15/2019, 02/05/2020, 08/03/2020   Pfizer Covid-19 Vaccine Bivalent Booster 72yrs & up 01/26/2021   Pneumococcal Conjugate-13 01/27/2015   Pneumococcal Polysaccharide-23 05/26/2017   Respiratory Syncytial Virus Vaccine,Recomb Aduvanted(Arexvy) 04/01/2022   Tdap 10/04/2011   Zoster Recombinant(Shingrix) 10/13/2017, 01/02/2018     Objective: Vital Signs: BP 118/66   Pulse 86   Temp 97.6 F (36.4 C)   Resp 16   Ht 5' 6 (1.676 m)   Wt 182 lb (82.6 kg)   BMI 29.38 kg/m    Physical Exam   Musculoskeletal Exam: ***    Investigation: No additional findings.  Imaging: No results found.  Recent Labs: Lab Results  Component Value Date   WBC 11.8 (H) 05/10/2024   HGB 12.8 05/10/2024   PLT 307.0 05/10/2024   NA 137 05/10/2024   K 4.6 05/10/2024   CL 100 05/10/2024   CO2 30 05/10/2024   GLUCOSE 112 (H) 05/10/2024   BUN 22 05/10/2024   CREATININE 1.01 05/10/2024   BILITOT 0.5 05/10/2024   ALKPHOS 41 05/10/2024   AST 18 05/10/2024   ALT 17 05/10/2024   PROT 7.0 05/10/2024   ALBUMIN 4.5 05/10/2024   CALCIUM  10.2 05/10/2024   GFRAA >60 11/27/2019   QFTBGOLDPLUS Negative 11/21/2022    Speciality Comments: No specialty comments available.  Procedures:  Hand/UE Inj: L elbow for medial epicondylitis on 05/21/2024 3:40 PM Details: 27 G needle, medial approach Medications: 1 mL lidocaine  1 %; 40 mg triamcinolone  acetonide 40 MG/ML Outcome: tolerated well, no immediate complications Procedure, treatment alternatives, risks and benefits explained, specific risks discussed. Consent was given by the patient. Immediately prior to procedure a time out was called to verify the correct patient, procedure, equipment, support staff and site/side marked as required. Patient was prepped and draped in the usual sterile  fashion.     Allergies: Advair hfa [fluticasone -salmeterol], Statins, Breo ellipta  [fluticasone  furoate-vilanterol], Fosamax [alendronate sodium], Infliximab, Spiriva  handihaler [tiotropium bromide ], Verapamil, and Tape   Assessment / Plan:     Visit Diagnoses:  Assessment & Plan Seronegative rheumatoid arthritis (HCC)  Orders:   Sedimentation rate   VITAMIN D  25 Hydroxy (Vit-D Deficiency, Fractures)   B12   TSH   Iron, TIBC and Ferritin Panel   Hand/UE Inj: L elbow  High risk medication use     Medial epicondylitis of elbow, left  Orders:   Hand/UE Inj: L elbow  Medial epicondylitis of left elbow  Orders:   Hand/UE Inj: L elbow  Other fatigue  Orders:   Sedimentation rate   VITAMIN D  25 Hydroxy (Vit-D Deficiency, Fractures)   B12   TSH   Iron, TIBC and Ferritin Panel  Abnormal laboratory test result  Orders:   Sedimentation rate   VITAMIN D  25 Hydroxy (Vit-D Deficiency, Fractures)   B12   TSH   Iron, TIBC and Ferritin Panel  Vitamin D  deficiency     Vitamin B12 deficiency      ***  Follow-Up Instructions: Return in about 3 months (around 08/18/2024) for RA on ENB/NSAID/GC/inj f/u 3mos.   Lonni LELON Ester, MD  Note - This record has been created using [employer].  Chart creation errors have been sought, but may not always  have been located. Such creation errors do not reflect on  the standard of medical care. "

## 2024-05-10 NOTE — Progress Notes (Signed)
 "  Subjective:    Vanessa Harmon is a 74 y.o. female and is here for a comprehensive physical exam.  HPI  Health Maintenance Due  Topic Date Due   Diabetic kidney evaluation - Urine ACR  Never done   Medicare Annual Wellness (AWV)  07/09/2022   Mammogram  06/20/2024    Discussed the use of AI scribe software for clinical note transcription with the patient, who gave verbal consent to proceed.  History of Present Illness   Vanessa Harmon is a 74 year old female with rheumatoid arthritis and osteoarthritis who presents for follow-up on chronic pain management and recent foot surgery.  She had foot surgery in October with prolonged boot use for about three months due to complications and cast changes, which limited her mobility. She was largely confined to the downstairs of her home and used a wheelchair in narrow areas.  She has chronic back pain that worsened while using the boot. Pain intensity fluctuates. She uses a rollator for shopping and around the house to reduce back strain and improve mobility.  She is on Enbrel  for rheumatoid arthritis with no recent regimen changes.  She takes azithromycin 250 mg three times a week for inflammation and sinus issues from her allergy/asthma specialist. During recent home renovations her sinus symptoms worsened and she was temporarily increased to a higher azithromycin dose.  She is worried that dietary changes during recovery, largely due to her husband's cooking and kitchen disruption, have worsened her A1c. She prefers to delay A1c testing until the kitchen renovations are complete.  Her blood pressure is slightly elevated in clinic but normal at home.  She is up to date on flu and COVID vaccines, mammogram, and bone density testing.       Health Maintenance: Immunizations -- UpToDate  Colonoscopy -- UpToDate  Mammogram -- UpToDate  PAP -- N/A  Bone Density -- UpToDate  Diet -- recently has had issues with increased sweets  intake Exercise -- overall healthy  Sleep habits -- no major concerns Mood -- stable  UTD with dentist? - yes UTD with eye doctor? - yes  Weight history: Wt Readings from Last 10 Encounters:  05/10/24 179 lb 4 oz (81.3 kg)  11/15/23 179 lb 9.6 oz (81.5 kg)  11/07/23 178 lb 6.1 oz (80.9 kg)  08/16/23 179 lb 14.3 oz (81.6 kg)  08/14/23 177 lb (80.3 kg)  07/15/23 173 lb (78.5 kg)  05/16/23 173 lb (78.5 kg)  05/11/23 173 lb 4 oz (78.6 kg)  04/03/23 175 lb (79.4 kg)  03/10/23 175 lb (79.4 kg)   Body mass index is 28.93 kg/m. No LMP recorded. Patient is postmenopausal.  Alcohol  use:  reports that she does not currently use alcohol .  Tobacco use:  Tobacco Use: Medium Risk (05/10/2024)   Patient History    Smoking Tobacco Use: Former    Smokeless Tobacco Use: Never    Passive Exposure: Never   Eligible for lung cancer screening? no     05/10/2024    1:07 PM  Depression screen PHQ 2/9  Decreased Interest 0  Down, Depressed, Hopeless 0  PHQ - 2 Score 0     Other providers/specialists: Patient Care Team: Job Lukes, [place] as PCP - General (Physician Assistant) Mai Lynwood FALCON, MD as Consulting Physician (Rheumatology) Unice Pac, MD as Consulting Physician (Neurosurgery) Casilda Lye, MD as Referring Physician (Allergy and Immunology) Octavia Charleston, MD as Consulting Physician (Ophthalmology) Elner Arley LABOR, MD as Consulting Physician (Retina Ophthalmology) Ernie,  Donnice, MD as Consulting Physician (Orthopedic Surgery) Dolan Mateo Larger, MD as Consulting Physician (Nephrology) Nicholaus Sherlean CROME, [facility] (Inactive) as Pharmacist (Pharmacist)    PMHx, SurgHx, SocialHx, Medications, and Allergies were reviewed in the Visit Navigator and updated as appropriate.   Past Medical History:  Diagnosis Date   Adenomatous polyp    Allergy    Anxiety    takes Ativan  daily as needed   Arthritis    inflammatory arthritis (Dr. RONAL Ludie Piety)   Asthma    Cataract     Chronic back pain    scoliosis    Chronic kidney disease 11/07/2023   Constipation    takes miralax  every other day   COVID 05/19/2022   Depression    takes Lexapro  daily   Eczema    Essential hypertension, benign    takes Hyzaar and Metoprolol  daily   Family history of adverse reaction to anesthesia    Sister PONV   Fibromyalgia    GERD (gastroesophageal reflux disease)    History of colon polyps    History of shingles    Hyperlipidemia    takes Atorvastatin  daily   Insomnia    takes Melatonin nightly   Joint pain    Osteoarthritis    Osteoporosis    PSVT (paroxysmal supraventricular tachycardia)    Recurrent upper respiratory infection (URI)    Rheumatoid arthritis (HCC)    sees Dr. Mai; stable on Orencia    Rhinitis, allergic    uses Flonase  daily   Urinary urgency    Urticaria      Past Surgical History:  Procedure Laterality Date   ADENOIDECTOMY     APPENDECTOMY     BACK SURGERY  2012   at age 65 d/t scoliosis   BLEPHAROPLASTY  2018   BREAST BIOPSY     cataract surgery     COLONOSCOPY     EYE SURGERY     FOOT SURGERY Right 01/23/2024   FRACTURE SURGERY     HARDWARE REMOVAL Left 02/24/2020   Procedure: HARDWARE REMOVAL LEFT DISTAL RADIUS;  Surgeon: Murrell Drivers, MD;  Location: [facility];  Service: Orthopedics;  Laterality: Left;   INCISION AND DRAINAGE OF WOUND Left 08/16/2023   Procedure: IRRIGATION AND DEBRIDEMENT WOUND;  Surgeon: Romona Harari, MD;  Location: [facility];  Service: Orthopedics;  Laterality: Left;   JOINT REPLACEMENT     KNEE ARTHROSCOPY Bilateral    neuroma removed from foot     unsure of which foot   OPEN REDUCTION INTERNAL FIXATION (ORIF) DISTAL RADIAL FRACTURE Left 08/15/2019   Procedure: OPEN REDUCTION INTERNAL FIXATION (ORIF) DISTAL RADIAL FRACTURE;  Surgeon: Murrell Drivers, MD;  Location: [facility];  Service: Orthopedics;  Laterality: Left;  block in preop   POSTERIOR CERVICAL  FUSION/FORAMINOTOMY N/A 05/29/2020   Procedure: Removal of hardware, Replacement of thoracic 8, thoracic 9, thoracic 10 screws, Right Thoracic 6-7, Right Thoracic 7-8 Laminectomies with thoracic hooks from Thoracic 4-5 to Thoracic 10;  Surgeon: Unice Pac, MD;  Location: [facility];  Service: Neurosurgery;  Laterality: N/A;   POSTERIOR LUMBAR FUSION 4 LEVEL N/A 02/15/2018   Procedure: Decompression and fusion Thoracic nine to Lumbar two with exploration of previous fusion;  Surgeon: Unice Pac, MD;  Location: [facility] OR;  Service: Neurosurgery;  Laterality: N/A;   SHOULDER SURGERY Right    SPINE SURGERY     TONSILLECTOMY     TOTAL HIP ARTHROPLASTY Right 04/15/2014   Procedure: RIGHT TOTAL HIP ARTHROPLASTY ANTERIOR APPROACH;  Surgeon: Evalene JONETTA Chancy, MD;  Location: [facility] OR;  Service: Orthopedics;  Laterality: Right;   TOTAL KNEE ARTHROPLASTY Left 11/26/2019   Procedure: TOTAL KNEE ARTHROPLASTY;  Surgeon: Ernie Cough, MD;  Location: [facility];  Service: Orthopedics;  Laterality: Left;  70 mins   VITRECTOMY Left 06/28/2023   VITRECTOMY Right 08/09/2023   WOUND EXPLORATION Left 07/15/2023   Procedure: WOUND EXPLORATION;  Surgeon: Romona Harari, MD;  Location: [facility] OR;  Service: Orthopedics;  Laterality: Left;  ARM     Family History  Problem Relation Age of Onset   Atrial fibrillation Mother    Stroke Mother    Allergic rhinitis Mother    Hypertension Mother    Miscarriages / Stillbirths Mother    Allergic rhinitis Father    AAA (abdominal aortic aneurysm) Father    COPD Father    Prostate cancer Father    Cancer Father    Depression Father    Hearing loss Father    Atrial fibrillation Brother        ablation   Stomach cancer Paternal Grandmother        some sort of abdominal cancer   Hypertension Other        unspecified grandmother   AAA (abdominal aortic aneurysm) Other        unspecified grandfather   Cancer Other        unspecified grandfather   Neuropathy Neg Hx    Colon  cancer Neg Hx    Esophageal cancer Neg Hx    Rectal cancer Neg Hx     Social History[1]  Review of Systems:   Review of Systems  Constitutional:  Negative for chills, fever, malaise/fatigue and weight loss.  HENT:  Negative for hearing loss, sinus pain and sore throat.   Respiratory:  Negative for cough and hemoptysis.   Cardiovascular:  Negative for chest pain, palpitations, leg swelling and PND.  Gastrointestinal:  Negative for abdominal pain, constipation, diarrhea, heartburn, nausea and vomiting.  Genitourinary:  Negative for dysuria, frequency and urgency.  Musculoskeletal:  Negative for back pain, myalgias and neck pain.  Skin:  Negative for itching and rash.  Neurological:  Negative for dizziness, tingling, seizures and headaches.  Endo/Heme/Allergies:  Negative for polydipsia.  Psychiatric/Behavioral:  Negative for depression. The patient is not nervous/anxious.     Objective:   BP (!) 150/90 (BP Location: Left Arm, Patient Position: Sitting, Cuff Size: Normal)   Pulse 76   Temp (!) 97.5 F (36.4 C) (Temporal)   Ht 5' 6 (1.676 m)   Wt 179 lb 4 oz (81.3 kg)   BMI 28.93 kg/m  Body mass index is 28.93 kg/m.   General Appearance:    Alert, cooperative, no distress, appears stated age  Head:    Normocephalic, without obvious abnormality, atraumatic  Eyes:    PERRL, conjunctiva/corneas clear, EOM's intact, fundi    benign, both eyes  Ears:    Normal TM's and external ear canals, both ears  Nose:   Nares normal, septum midline, mucosa normal, no drainage    or sinus tenderness  Throat:   Lips, mucosa, and tongue normal; teeth and gums normal  Neck:   Supple, symmetrical, trachea midline, no adenopathy;    thyroid :  no enlargement/tenderness/nodules; no carotid   bruit or JVD  Back:     Symmetric, no curvature, ROM normal, no CVA tenderness  Lungs:     Clear to auscultation bilaterally, respirations unlabored  Chest Wall:    No tenderness or deformity  Heart:     Regular rate and rhythm, S1 and S2 normal, no murmur, rub or gallop  Breast Exam:    Deferred  Abdomen:     Soft, non-tender, bowel sounds active all four quadrants,    no masses, no organomegaly  Genitalia:    Deferred   Extremities:   Extremities normal, atraumatic, no cyanosis or edema  Pulses:   2+ and symmetric all extremities  Skin:   Skin color, texture, turgor normal, no rashes or lesions  Lymph nodes:   Cervical, supraclavicular, and axillary nodes normal  Neurologic:   CNII-XII intact, normal strength, sensation and reflexes    throughout    Assessment/Plan:   Assessment and Plan    Comprehensive Physical Exam (CPE) preventive care annual visit Today patient counseled on age appropriate routine health concerns for screening and prevention, each reviewed and up to date or declined. Immunizations reviewed and up to date or declined. Labs ordered and reviewed. Risk factors for depression reviewed and negative. Hearing function and visual acuity are intact. ADLs screened and addressed as needed. Functional ability and level of safety reviewed and appropriate. Education, counseling and referrals performed based on assessed risks today. Patient provided with a copy of personalized plan for preventive services.  Lumbar radiculopathy  Pain management ineffective.  - Provided information on low-dose radiation therapy study for severe arthritis. - Consider referral to [facility] for further evaluation and management. - Consider ongoing management with pain management and rheumatology  Seronegative rheumatoid arthritis Managed with Enbrel . No changes to regimen. Injection for epicondylitis scheduled. - Continue Enbrel  for rheumatoid arthritis. - Proceed with scheduled injection for epicondylitis.  Essential hypertension Blood pressure slightly elevated in office, normal at home. - Monitor blood pressure at home and report any significant changes.  - Continue amlodipine  10 mg daily,  irbesartan  300 mg daily, lasix  20 mg daily         Rashawn Rolon, PA-C [facility]           [1]  Social History Tobacco Use   Smoking status: Former    Current packs/day: 0.00    Average packs/day: 1 pack/day for 20.0 years (20.0 ttl pk-yrs)    Types: Cigarettes    Start date: 54    Quit date: 1998    Years since quitting: 28.0    Passive exposure: Never   Smokeless tobacco: Never  Vaping Use   Vaping status: Never Used  Substance Use Topics   Alcohol  use: Not Currently   Drug use: Never   "

## 2024-05-10 NOTE — Assessment & Plan Note (Addendum)
 SABRA

## 2024-05-12 ENCOUNTER — Ambulatory Visit: Payer: Self-pay | Admitting: Physician Assistant

## 2024-05-15 ENCOUNTER — Other Ambulatory Visit: Payer: Self-pay | Admitting: Physician Assistant

## 2024-05-15 ENCOUNTER — Other Ambulatory Visit (HOSPITAL_COMMUNITY): Payer: Self-pay

## 2024-05-15 ENCOUNTER — Other Ambulatory Visit: Payer: Self-pay

## 2024-05-21 ENCOUNTER — Ambulatory Visit: Admitting: Internal Medicine

## 2024-05-21 ENCOUNTER — Other Ambulatory Visit: Payer: Self-pay | Admitting: Physician Assistant

## 2024-05-21 ENCOUNTER — Encounter: Payer: Self-pay | Admitting: Internal Medicine

## 2024-05-21 VITALS — BP 118/66 | HR 86 | Temp 97.6°F | Resp 16 | Ht 66.0 in | Wt 182.0 lb

## 2024-05-21 DIAGNOSIS — M0600 Rheumatoid arthritis without rheumatoid factor, unspecified site: Secondary | ICD-10-CM

## 2024-05-21 DIAGNOSIS — M7702 Medial epicondylitis, left elbow: Secondary | ICD-10-CM

## 2024-05-21 DIAGNOSIS — R899 Unspecified abnormal finding in specimens from other organs, systems and tissues: Secondary | ICD-10-CM | POA: Insufficient documentation

## 2024-05-21 DIAGNOSIS — E559 Vitamin D deficiency, unspecified: Secondary | ICD-10-CM | POA: Insufficient documentation

## 2024-05-21 DIAGNOSIS — E538 Deficiency of other specified B group vitamins: Secondary | ICD-10-CM | POA: Insufficient documentation

## 2024-05-21 DIAGNOSIS — R5383 Other fatigue: Secondary | ICD-10-CM | POA: Insufficient documentation

## 2024-05-21 DIAGNOSIS — Z79899 Other long term (current) drug therapy: Secondary | ICD-10-CM

## 2024-05-21 DIAGNOSIS — R609 Edema, unspecified: Secondary | ICD-10-CM

## 2024-05-21 NOTE — Assessment & Plan Note (Signed)
 SABRA

## 2024-05-21 NOTE — Assessment & Plan Note (Addendum)
" °  Orders:   Hand/UE Inj: L elbow  "

## 2024-05-21 NOTE — Assessment & Plan Note (Signed)
 Vanessa Harmon

## 2024-05-22 LAB — VITAMIN B12: Vitamin B-12: 242 pg/mL (ref 200–1100)

## 2024-05-22 LAB — IRON,TIBC AND FERRITIN PANEL
%SAT: 21 % (ref 16–45)
Ferritin: 25 ng/mL (ref 16–288)
Iron: 83 ug/dL (ref 45–160)
TIBC: 395 ug/dL (ref 250–450)

## 2024-05-22 LAB — TSH: TSH: 0.94 m[IU]/L (ref 0.40–4.50)

## 2024-05-22 LAB — SEDIMENTATION RATE: Sed Rate: 9 mm/h (ref 0–30)

## 2024-05-22 LAB — VITAMIN D 25 HYDROXY (VIT D DEFICIENCY, FRACTURES): Vit D, 25-Hydroxy: 60 ng/mL (ref 30–100)

## 2024-08-19 ENCOUNTER — Ambulatory Visit: Admitting: Internal Medicine

## 2025-05-12 ENCOUNTER — Encounter: Admitting: Physician Assistant
